# Patient Record
Sex: Female | Born: 1945 | Race: Black or African American | Hispanic: No | Marital: Single | Smoking: Never smoker
Health system: Southern US, Community
[De-identification: ages and names within clinical notes are randomized; demographics above are authoritative.]

## PROBLEM LIST (undated history)

## (undated) DIAGNOSIS — C649 Malignant neoplasm of unspecified kidney, except renal pelvis: Secondary | ICD-10-CM

## (undated) DIAGNOSIS — G20 Parkinson's disease: Secondary | ICD-10-CM

## (undated) DIAGNOSIS — K31A0 Gastric intestinal metaplasia, unspecified: Secondary | ICD-10-CM

## (undated) DIAGNOSIS — M1990 Unspecified osteoarthritis, unspecified site: Secondary | ICD-10-CM

## (undated) DIAGNOSIS — N289 Disorder of kidney and ureter, unspecified: Secondary | ICD-10-CM

## (undated) DIAGNOSIS — B019 Varicella without complication: Secondary | ICD-10-CM

## (undated) DIAGNOSIS — R011 Cardiac murmur, unspecified: Secondary | ICD-10-CM

## (undated) DIAGNOSIS — R079 Chest pain, unspecified: Secondary | ICD-10-CM

## (undated) DIAGNOSIS — K5790 Diverticulosis of intestine, part unspecified, without perforation or abscess without bleeding: Secondary | ICD-10-CM

## (undated) DIAGNOSIS — H269 Unspecified cataract: Secondary | ICD-10-CM

## (undated) DIAGNOSIS — I10 Essential (primary) hypertension: Secondary | ICD-10-CM

## (undated) DIAGNOSIS — T148XXA Other injury of unspecified body region, initial encounter: Secondary | ICD-10-CM

## (undated) DIAGNOSIS — E1129 Type 2 diabetes mellitus with other diabetic kidney complication: Secondary | ICD-10-CM

## (undated) DIAGNOSIS — D219 Benign neoplasm of connective and other soft tissue, unspecified: Secondary | ICD-10-CM

## (undated) DIAGNOSIS — E039 Hypothyroidism, unspecified: Secondary | ICD-10-CM

## (undated) DIAGNOSIS — E7800 Pure hypercholesterolemia, unspecified: Secondary | ICD-10-CM

## (undated) DIAGNOSIS — D649 Anemia, unspecified: Secondary | ICD-10-CM

## (undated) DIAGNOSIS — K3189 Other diseases of stomach and duodenum: Secondary | ICD-10-CM

## (undated) DIAGNOSIS — R634 Abnormal weight loss: Secondary | ICD-10-CM

## (undated) DIAGNOSIS — N390 Urinary tract infection, site not specified: Secondary | ICD-10-CM

## (undated) DIAGNOSIS — K219 Gastro-esophageal reflux disease without esophagitis: Secondary | ICD-10-CM

## (undated) HISTORY — DX: Disorder of kidney and ureter, unspecified: N28.9

## (undated) HISTORY — DX: Unspecified osteoarthritis, unspecified site: M19.90

## (undated) HISTORY — DX: Malignant neoplasm of unspecified kidney, except renal pelvis: C64.9

## (undated) HISTORY — PX: DG BARIUM SWALLOW (ARMC HX): HXRAD1448

## (undated) HISTORY — PX: CATARACT EXTRACTION, BILATERAL: SHX1313

## (undated) HISTORY — DX: Varicella without complication: B01.9

## (undated) HISTORY — DX: Other injury of unspecified body region, initial encounter: T14.8XXA

## (undated) HISTORY — DX: Type 2 diabetes mellitus with other diabetic kidney complication: E11.29

## (undated) HISTORY — DX: Gastro-esophageal reflux disease without esophagitis: K21.9

## (undated) HISTORY — DX: Other diseases of stomach and duodenum: K31.89

## (undated) HISTORY — DX: Unspecified cataract: H26.9

## (undated) HISTORY — DX: Hypothyroidism, unspecified: E03.9

## (undated) HISTORY — DX: Chest pain, unspecified: R07.9

## (undated) HISTORY — DX: Urinary tract infection, site not specified: N39.0

## (undated) HISTORY — DX: Gastric intestinal metaplasia, unspecified: K31.A0

## (undated) HISTORY — DX: Cardiac murmur, unspecified: R01.1

## (undated) HISTORY — DX: Anemia, unspecified: D64.9

## (undated) HISTORY — DX: Diverticulosis of intestine, part unspecified, without perforation or abscess without bleeding: K57.90

## (undated) HISTORY — DX: Abnormal weight loss: R63.4

## (undated) HISTORY — PX: COLONOSCOPY: SHX174

## (undated) HISTORY — DX: Benign neoplasm of connective and other soft tissue, unspecified: D21.9

## (undated) HISTORY — DX: Parkinson's disease: G20

---

## 1987-10-04 HISTORY — PX: ABDOMINAL HYSTERECTOMY: SHX81

## 1998-12-15 ENCOUNTER — Encounter: Payer: Self-pay | Admitting: Obstetrics and Gynecology

## 1998-12-15 ENCOUNTER — Ambulatory Visit (HOSPITAL_COMMUNITY): Admission: RE | Admit: 1998-12-15 | Discharge: 1998-12-15 | Payer: Self-pay | Admitting: Obstetrics and Gynecology

## 1999-07-23 ENCOUNTER — Other Ambulatory Visit: Admission: RE | Admit: 1999-07-23 | Discharge: 1999-07-23 | Payer: Self-pay | Admitting: *Deleted

## 1999-10-27 ENCOUNTER — Ambulatory Visit (HOSPITAL_COMMUNITY): Admission: RE | Admit: 1999-10-27 | Discharge: 1999-10-27 | Payer: Self-pay | Admitting: Endocrinology

## 1999-10-27 ENCOUNTER — Encounter: Payer: Self-pay | Admitting: Endocrinology

## 1999-11-05 ENCOUNTER — Ambulatory Visit (HOSPITAL_COMMUNITY): Admission: RE | Admit: 1999-11-05 | Discharge: 1999-11-05 | Payer: Self-pay | Admitting: Endocrinology

## 1999-11-05 ENCOUNTER — Encounter: Payer: Self-pay | Admitting: Endocrinology

## 2000-09-05 ENCOUNTER — Ambulatory Visit (HOSPITAL_COMMUNITY): Admission: RE | Admit: 2000-09-05 | Discharge: 2000-09-05 | Payer: Self-pay | Admitting: *Deleted

## 2000-09-05 ENCOUNTER — Encounter: Payer: Self-pay | Admitting: *Deleted

## 2000-09-14 ENCOUNTER — Encounter: Admission: RE | Admit: 2000-09-14 | Discharge: 2000-09-14 | Payer: Self-pay | Admitting: *Deleted

## 2000-09-14 ENCOUNTER — Encounter: Payer: Self-pay | Admitting: *Deleted

## 2000-10-12 ENCOUNTER — Other Ambulatory Visit: Admission: RE | Admit: 2000-10-12 | Discharge: 2000-10-12 | Payer: Self-pay | Admitting: Obstetrics and Gynecology

## 2004-07-29 ENCOUNTER — Ambulatory Visit (HOSPITAL_COMMUNITY): Admission: RE | Admit: 2004-07-29 | Discharge: 2004-07-29 | Payer: Self-pay | Admitting: Gastroenterology

## 2005-10-14 ENCOUNTER — Ambulatory Visit (HOSPITAL_COMMUNITY): Admission: RE | Admit: 2005-10-14 | Discharge: 2005-10-14 | Payer: Self-pay | Admitting: Obstetrics and Gynecology

## 2005-10-17 LAB — HM MAMMOGRAPHY

## 2005-10-31 ENCOUNTER — Ambulatory Visit (HOSPITAL_COMMUNITY): Admission: RE | Admit: 2005-10-31 | Discharge: 2005-10-31 | Payer: Self-pay | Admitting: Obstetrics and Gynecology

## 2005-10-31 LAB — HM DEXA SCAN

## 2005-11-03 ENCOUNTER — Encounter: Admission: RE | Admit: 2005-11-03 | Discharge: 2005-11-03 | Payer: Self-pay | Admitting: Obstetrics and Gynecology

## 2008-01-02 LAB — HM MAMMOGRAPHY

## 2010-04-17 ENCOUNTER — Observation Stay (HOSPITAL_COMMUNITY): Admission: EM | Admit: 2010-04-17 | Discharge: 2010-04-19 | Payer: Self-pay | Admitting: Emergency Medicine

## 2010-10-24 ENCOUNTER — Encounter: Payer: Self-pay | Admitting: Obstetrics and Gynecology

## 2010-12-18 LAB — CBC
MCH: 29.3 pg (ref 26.0–34.0)
MCHC: 33.3 g/dL (ref 30.0–36.0)
MCHC: 33.4 g/dL (ref 30.0–36.0)
MCV: 87.8 fL (ref 78.0–100.0)
Platelets: 203 10*3/uL (ref 150–400)
RBC: 3.99 MIL/uL (ref 3.87–5.11)
RDW: 15.1 % (ref 11.5–15.5)
RDW: 15.4 % (ref 11.5–15.5)
WBC: 6 10*3/uL (ref 4.0–10.5)

## 2010-12-18 LAB — COMPREHENSIVE METABOLIC PANEL
ALT: 16 U/L (ref 0–35)
AST: 16 U/L (ref 0–37)
Albumin: 3.7 g/dL (ref 3.5–5.2)
Alkaline Phosphatase: 49 U/L (ref 39–117)
Calcium: 9.3 mg/dL (ref 8.4–10.5)
Sodium: 140 mEq/L (ref 135–145)
Total Bilirubin: 1.1 mg/dL (ref 0.3–1.2)
Total Protein: 6.8 g/dL (ref 6.0–8.3)

## 2010-12-18 LAB — BASIC METABOLIC PANEL
CO2: 27 mEq/L (ref 19–32)
CO2: 27 mEq/L (ref 19–32)
Calcium: 9 mg/dL (ref 8.4–10.5)
Chloride: 107 mEq/L (ref 96–112)
Creatinine, Ser: 0.91 mg/dL (ref 0.4–1.2)
Creatinine, Ser: 1.16 mg/dL (ref 0.4–1.2)
GFR calc Af Amer: >60 mL/min (ref >60)
GFR calc non Af Amer: >60 mL/min (ref >60)
Glucose, Bld: 137 mg/dL — ABNORMAL HIGH (ref 70–99)
Glucose, Bld: 163 mg/dL — ABNORMAL HIGH (ref 70–99)
Sodium: 140 mEq/L (ref 135–145)

## 2010-12-18 LAB — DIFFERENTIAL
Basophils Absolute: 0 10*3/uL (ref 0.0–0.1)
Basophils Relative: 1 % (ref 0–1)
Lymphocytes Relative: 27 % (ref 12–46)
Monocytes Relative: 8 % (ref 3–12)
Neutro Abs: 3.8 10*3/uL (ref 1.7–7.7)
Neutrophils Relative %: 63 % (ref 43–77)

## 2010-12-18 LAB — CARDIAC PANEL(CRET KIN+CKTOT+MB+TROPI)
CK, MB: 1.5 ng/mL (ref 0.3–4.0)
CK, MB: 1.5 ng/mL (ref 0.3–4.0)
CK, MB: 1.8 ng/mL (ref 0.3–4.0)
CK, MB: 1.9 ng/mL (ref 0.3–4.0)
Relative Index: 1.1 (ref 0.0–2.5)
Relative Index: 1.4 (ref 0.0–2.5)
Total CK: 119 U/L (ref 7–177)
Total CK: 134 U/L (ref 7–177)
Troponin I: 0.01 ng/mL (ref 0.00–0.06)
Troponin I: 0.02 ng/mL (ref 0.00–0.06)

## 2010-12-18 LAB — TSH: TSH: 0.277 u[IU]/mL — ABNORMAL LOW (ref 0.350–4.500)

## 2010-12-18 LAB — LIPID PANEL
Triglycerides: 104 mg/dL (ref <150)
VLDL: 21 mg/dL (ref 0–40)

## 2010-12-18 LAB — POCT CARDIAC MARKERS
CKMB, poc: 1.2 ng/mL (ref 1.0–8.0)
Myoglobin, poc: 67.4 ng/mL (ref 12–200)

## 2010-12-18 LAB — T4, FREE: Free T4: 1.21 ng/dL (ref 0.80–1.80)

## 2010-12-18 LAB — GLUCOSE, CAPILLARY
Glucose-Capillary: 127 mg/dL — ABNORMAL HIGH (ref 70–99)
Glucose-Capillary: 136 mg/dL — ABNORMAL HIGH (ref 70–99)

## 2010-12-18 LAB — HEMOGLOBIN A1C: Mean Plasma Glucose: 151 mg/dL — ABNORMAL HIGH (ref <117)

## 2010-12-18 LAB — MAGNESIUM: Magnesium: 1.7 mg/dL (ref 1.5–2.5)

## 2011-02-18 NOTE — Op Note (Signed)
NAME:  Pamela Patton, Pamela Patton                 ACCOUNT NO.:  [id number]   MEDICAL RECORD NO.:  [id number]          PATIENT TYPE:  AMB   LOCATION:  ENDO                         FACILITY:  [facility]   PHYSICIAN:  Graylin Shiver, M.D.   DATE OF BIRTH:  [birth year]   DATE OF PROCEDURE:  07/29/2004  DATE OF DISCHARGE:                                 OPERATIVE REPORT   PROCEDURE:  Flexible sigmoidoscopy.   SURGEON:   INDICATIONS FOR PROCEDURE:  Screening.   Informed consent was obtained after explanation of the risks of bleeding,  infection and perforation.   PREMEDICATION:  Fentanyl 75 mcg IV and Versed 7.5 mg IV.   DESCRIPTION OF PROCEDURE:  With the patient in the left lateral decubitus  position, a rectal examination was performed.  No masses were felt.  The  Olympus flexible pediatric adjustable colonoscope was inserted into the  rectum and advanced around to the colon.  The sigmoid was tortuous and  fixed.  I could not advance the scope out of the sigmoid colon.  The patient  has had a prior hysterectomy and has a large lower midline abdominal scar.  I suspect that she has adhesions.  There were diverticula noted in the  sigmoid.  The rectum looked normal.  The patient tolerated the procedure  well without complications.   IMPRESSION:  Diverticulosis in the sigmoid.   PLAN:  Proceed with barium enema.       SFG/MEDQ  D:  07/29/2004  T:  07/29/2004  Job:  [id number]   cc:   Veverly Fells. Altheimer, M.D.  [address]  [place]  [place] [address]  Fax: [phone]

## 2012-08-06 ENCOUNTER — Encounter (HOSPITAL_COMMUNITY): Payer: Self-pay | Admitting: Emergency Medicine

## 2012-08-06 ENCOUNTER — Emergency Department (INDEPENDENT_AMBULATORY_CARE_PROVIDER_SITE_OTHER)
Admission: EM | Admit: 2012-08-06 | Discharge: 2012-08-06 | Disposition: A | Discharge status: Home / Self Care | Payer: Medicare Other | Source: Home / Self Care | Attending: Family Medicine | Admitting: Family Medicine

## 2012-08-06 DIAGNOSIS — S239XXA Sprain of unspecified parts of thorax, initial encounter: Secondary | ICD-10-CM

## 2012-08-06 DIAGNOSIS — S29012A Strain of muscle and tendon of back wall of thorax, initial encounter: Secondary | ICD-10-CM

## 2012-08-06 HISTORY — DX: Essential (primary) hypertension: I10

## 2012-08-06 HISTORY — DX: Pure hypercholesterolemia, unspecified: E78.00

## 2012-08-06 MED ORDER — METAXALONE 800 MG PO TABS: 800.0000 mg | ORAL_TABLET | Freq: Three times a day (TID) | ORAL | Status: DC

## 2012-08-06 NOTE — ED Notes (Signed)
Placed in gown for evaluation

## 2012-08-06 NOTE — ED Notes (Signed)
Patient did not have doses of medicines.  At end of nurse assessment, patient found paper with doses.

## 2012-08-06 NOTE — ED Notes (Signed)
Has an appt with [facility] to establish pcp.  The endocrinologist was prescribing medicines.

## 2012-08-06 NOTE — ED Notes (Addendum)
Reports neck and back pain onset 3 days ago.  Reports pain present when she woke that am.  Reports similar episode approx 3 weeks ago.  This pain went away without intervention.  Patient currently involved in exercise class that she has attended since August 2013.  Denies feeling discomfort during class.  Reports base of neck spasm and spreading down back to mid back, spreading to either side of back.  Patient has been seen by cardiologist 2 weeks ago and did mention back pain then.  Physician did not offer any explanation for pain.  Denies chest pain, denies difficulty breathing.

## 2012-08-06 NOTE — ED Provider Notes (Signed)
History     CSN: [id number]  Arrival date & time 08/06/12  1230   First MD Initiated Contact with Patient 08/06/12 1300      Chief Complaint  Patient presents with  . Neck Pain    (Consider location/radiation/quality/duration/timing/severity/associated sxs/prior treatment) Patient is a 66 y.o. female presenting with neck pain. The history is provided by the patient.  Neck Pain  This is a new problem. The current episode started more than 2 days ago. The problem has not changed since onset.The pain is associated with nothing. The quality of the pain is described as shooting. The pain radiates to the left scapula and right scapula. Pertinent negatives include no chest pain, no numbness, no bowel incontinence and no paresis.    Past Medical History  Diagnosis Date  . Thyroid disease   . Diabetes mellitus without complication   . Hypertension   . High cholesterol     Past Surgical History  Procedure Date  . Abdominal hysterectomy     No family history on file.  History  Substance Use Topics  . Smoking status: Never Smoker   . Smokeless tobacco: Not on file  . Alcohol Use: No    OB History    Grav Para Term Preterm Abortions TAB SAB Ect Mult Living                  Review of Systems  Constitutional: Negative.   HENT: Positive for neck pain.   Respiratory: Negative for chest tightness.   Cardiovascular: Negative for chest pain.  Gastrointestinal: Negative for bowel incontinence.  Musculoskeletal: Positive for back pain. Negative for joint swelling and gait problem.  Neurological: Negative for numbness.    Allergies  Review of patient's allergies indicates no known allergies.  Home Medications   Current Outpatient Rx  Name  Route  Sig  Dispense  Refill  . AMLODIPINE BESYLATE PO   Oral   Take 10 mg by mouth.          . ASPIRIN 325 MG PO TBEC   Oral   Take 325 mg by mouth daily.         . ATENOLOL PO   Oral   Take 50 mg by mouth.          .  ATORVASTATIN CALCIUM PO   Oral   Take 40 mg by mouth.          . IBUPROFEN 200 MG PO TABS   Oral   Take 200 mg by mouth every 6 (six) hours as needed.         Marland Kitchen NOVOLOG [place]   Subcutaneous   Inject into the skin.         . INSULIN GLARGINE 100 UNIT/ML [place] SOLN   Subcutaneous   Inject 30 Units into the skin at bedtime.         Marland Kitchen LEVOTHYROXINE SODIUM 75 MCG PO TABS   Oral   Take 75 mcg by mouth daily.         Marland Kitchen METFORMIN HCL PO   Oral   Take 500 mg by mouth.          . ACTOS PO   Oral   Take by mouth.         Marland Kitchen RAMIPRIL PO   Oral   Take by mouth.         . DIOVAN PO   Oral   Take 320 mg by mouth.          Marland Kitchen  METAXALONE 800 MG PO TABS   Oral   Take 1 tablet (800 mg total) by mouth 3 (three) times daily. For muscle spasms.   21 tablet   0     BP 135/52  Pulse 64  Temp 97.9 F (36.6 C) (Oral)  Resp 20  SpO2 100%  Physical Exam  Nursing note and vitals reviewed. Constitutional: She is oriented to person, place, and time. She appears well-developed and well-nourished.  HENT:  Head: Normocephalic.  Neck: Normal range of motion. Neck supple.  Cardiovascular: Normal rate.   Pulmonary/Chest: Effort normal and breath sounds normal. She exhibits no tenderness.  Musculoskeletal: She exhibits tenderness.       Back:  Neurological: She is alert and oriented to person, place, and time.    ED Course  Procedures (including critical care time)  Labs Reviewed - No data to display No results found.   1. Upper back strain       MDM          Linna Hoff, MD 08/07/12 1436

## 2012-08-06 NOTE — ED Notes (Signed)
Department acuity delay

## 2012-08-24 ENCOUNTER — Encounter: Payer: Self-pay | Admitting: Family Medicine

## 2012-08-24 ENCOUNTER — Ambulatory Visit (INDEPENDENT_AMBULATORY_CARE_PROVIDER_SITE_OTHER): Payer: Medicare Other | Admitting: Family Medicine

## 2012-08-24 VITALS — BP 110/64 | HR 78 | Temp 98.8°F | Ht 65.75 in | Wt 190.0 lb

## 2012-08-24 DIAGNOSIS — E039 Hypothyroidism, unspecified: Secondary | ICD-10-CM | POA: Insufficient documentation

## 2012-08-24 DIAGNOSIS — E1129 Type 2 diabetes mellitus with other diabetic kidney complication: Secondary | ICD-10-CM

## 2012-08-24 DIAGNOSIS — I10 Essential (primary) hypertension: Secondary | ICD-10-CM

## 2012-08-24 DIAGNOSIS — N058 Unspecified nephritic syndrome with other morphologic changes: Secondary | ICD-10-CM

## 2012-08-24 DIAGNOSIS — E559 Vitamin D deficiency, unspecified: Secondary | ICD-10-CM

## 2012-08-24 DIAGNOSIS — E785 Hyperlipidemia, unspecified: Secondary | ICD-10-CM

## 2012-08-24 HISTORY — DX: Hypothyroidism, unspecified: E03.9

## 2012-08-24 HISTORY — DX: Vitamin D deficiency, unspecified: E55.9

## 2012-08-24 NOTE — Progress Notes (Signed)
Chief Complaint  Patient presents with  . Establish Care    HPI: Pamela Patton is here to establish care.  Last PCP and physical: Dr. Janine Limbo was her PCP - he passed away, then saw Dr. Leslie Dales. Last physical was in 2007 with her gyn doctor. Had total hysterectomy with unilateral oophorectomy for fibroids.  Work: recently retired Tree surgeon, Location manager - trained as vetrinarian Home Situation:  Lives alone, no family around, good friends Spiritual Beliefs: christian, very involved with her church - [employer]  Has the following chronic problems and concerns today:  DM: -Taking: lantus 35units daily, novolog 8 units with meal sliding scale, metformin, actos, acei, ASA -Last hgbA1c: none in our chart, last HgbA1c 6.9 recently per patient -Home BS: fasting 120 -last eye exam, foot exam: UTD -does have stage 3 kidney disease  HTN: -taking norvasc, atenolol, ramipril, valsartan - has been on acei and arb for a long time -denies:DP, SOB, swelling, palpitaiton  HLD: -atorvastatin  Hypothyroid: -on synthroid -stable, Dr. Leslie Dales  Back Strain: -dx in ED recently -resolved  Vitamin D deficiency: -in the past -takes Vit D intermitently  Other Providers: -Dr. Leslie Dales, endocrinology at [facility], followed for her diabetes and hypothyroidism - sees him on a regular basis, next visit December -Optho, Dr. Rosary Lively, UTD -Cards: Dr. Allyson Sabal - for her HTN, HLD   ROS: See pertinent positives and negatives per HPI.  Past Medical History  Diagnosis Date  . Thyroid disease   . Diabetes mellitus without complication   . Hypertension   . High cholesterol   . Arthritis   . Cancer   . Chicken pox   . GERD (gastroesophageal reflux disease)   . Hypertension   . High cholesterol   . Kidney disease   . Thyroid disease   . UTI (urinary tract infection)   . Chest pain summer 2012    Family History  Problem Relation Age of Onset  . Colon cancer Other   . Lung  cancer      grandparent  . Stroke      History   Social History  . Marital Status: Married    Spouse Name: N/A    Number of Children: N/A  . Years of Education: N/A   Social History Main Topics  . Smoking status: Never Smoker   . Smokeless tobacco: None  . Alcohol Use: No  . Drug Use: No  . Sexually Active:    Other Topics Concern  . None   Social History Narrative  . None    Current outpatient prescriptions:AMLODIPINE BESYLATE PO, Take 10 mg by mouth daily. , Disp: , Rfl: ;  aspirin 81 MG tablet, Take 81 mg by mouth daily., Disp: , Rfl: ;  ATENOLOL PO, Take 50 mg by mouth 2 (two) times daily. , Disp: , Rfl: ;  ATORVASTATIN CALCIUM PO, Take 40 mg by mouth at bedtime and may repeat dose one time if needed. , Disp: , Rfl:  Calcium Carbonate-Vitamin D (CALTRATE 600+D) 600-400 MG-UNIT per tablet, Take 1 tablet by mouth daily., Disp: , Rfl: ;  Cholecalciferol (VITAMIN D3) 2000 UNITS TABS, Take by mouth., Disp: , Rfl: ;  hydrochlorothiazide (HYDRODIURIL) 25 MG tablet, Take 25 mg by mouth daily. , Disp: , Rfl: ;  Insulin Aspart (NOVOLOG [place]), Inject into the skin as directed. , Disp: , Rfl:  insulin glargine (LANTUS) 100 UNIT/ML injection, Inject 35 Units into the skin at bedtime. , Disp: , Rfl: ;  levothyroxine (SYNTHROID, LEVOTHROID)  75 MCG tablet, Take 75 mcg by mouth daily., Disp: , Rfl: ;  METFORMIN HCL PO, Take 500 mg by mouth 2 (two) times daily. , Disp: , Rfl: ;  Multiple Vitamins-Minerals (CENTRUM SILVER PO), Take by mouth daily., Disp: , Rfl: ;  ONETOUCH DELICA LANCETS MISC, , Disp: , Rfl:  ONETOUCH VERIO IQ test strip, , Disp: , Rfl: ;  Pioglitazone HCl (ACTOS PO), Take 45 mg by mouth daily. , Disp: , Rfl: ;  RAMIPRIL PO, Take 10 mg by mouth daily. , Disp: , Rfl: ;  Valsartan (DIOVAN PO), Take 320 mg by mouth daily. , Disp: , Rfl: ;  Vitamin D, Ergocalciferol, (DRISDOL) 50000 UNITS CAPS, Take 50,000 Units by mouth., Disp: , Rfl:   EXAM:  Filed Vitals:   08/24/12 1249  BP:  110/64  Pulse: 78  Temp: 98.8 F (37.1 C)    Body mass index is 30.90 kg/(m^2).  GENERAL: vitals reviewed and listed above, alert, oriented, appears well hydrated and in no acute distress  HEENT: atraumatic, conjunttiva clear, no obvious abnormalities on inspection of external nose and ears  NECK: no obvious masses on inspection  LUNGS: clear to auscultation bilaterally, no wheezes, rales or rhonchi, good air movement  CV: HRRR, SEM, no peripheral edema  MS: moves all extremities without noticeable abnormality  PSYCH: pleasant and cooperative, no obvious depression or anxiety  ASSESSMENT AND PLAN:  Discussed the following assessment and plan:  1. Hypertension   2. Hyperlipemia   3. Hypothyroid   4. Vitamin D deficiency   5. Diabetes mellitus with renal complications    -Had labs recently - will try to get results, gets labs with endocrinology - asked pt to have him send [place] office visit notes and labs -will have her folllow up in 1 month for physical and health maintenance - hopefully will have records by then  -We reviewed the PMH, PSH, FH, SH, Meds and Allergies. -We have asked for records for pertinent exams, studies, vaccines and notes from previous providers. -We have advised patient to follow up per instructions below - needs CPE to address health maintenance   -Patient advised to return or notify a doctor immediately if symptoms worsen or persist or new concerns arise.  Patient Instructions  -We have ordered labs or studies at this visit. It can take up to 1-2 weeks for results and processing. We will contact you with instructions IF your results are abnormal. Normal results will be released to your [facility]. If you have not heard from [place] or can not find your results in [facility] in 2 weeks please contact our office.  -PLEASE SIGN UP FOR MYCHART TODAY   We recommend the following healthy lifestyle measures: - eat a healthy diet consisting of lots of vegetables,  fruits, beans, nuts, seeds, healthy meats such as white chicken and fish and whole grains.  - avoid fried foods, fast food, processed foods, sodas, red meet and other fattening foods.  - get a least 150 minutes of aerobic exercise per week.   Please have your other doctors send their reports and any labs or studies.  Follow up in: 1 month for CPE      Demeka Sutter R.

## 2012-08-24 NOTE — Patient Instructions (Addendum)
-  We have ordered labs or studies at this visit. It can take up to 1-2 weeks for results and processing. We will contact you with instructions IF your results are abnormal. Normal results will be released to your [facility]. If you have not heard from [place] or can not find your results in [facility] in 2 weeks please contact our office.  -PLEASE SIGN UP FOR MYCHART TODAY   We recommend the following healthy lifestyle measures: - eat a healthy diet consisting of lots of vegetables, fruits, beans, nuts, seeds, healthy meats such as white chicken and fish and whole grains.  - avoid fried foods, fast food, processed foods, sodas, red meet and other fattening foods.  - get a least 150 minutes of aerobic exercise per week.   Please have your other doctors send their reports and any labs or studies.  Follow up in: 1 month for CPE

## 2012-10-04 ENCOUNTER — Encounter: Payer: Self-pay | Admitting: Family Medicine

## 2012-10-04 DIAGNOSIS — E1129 Type 2 diabetes mellitus with other diabetic kidney complication: Secondary | ICD-10-CM

## 2012-10-04 DIAGNOSIS — E1165 Type 2 diabetes mellitus with hyperglycemia: Secondary | ICD-10-CM

## 2012-10-04 DIAGNOSIS — N189 Chronic kidney disease, unspecified: Secondary | ICD-10-CM

## 2012-10-08 ENCOUNTER — Encounter: Payer: Self-pay | Admitting: Family Medicine

## 2012-10-08 ENCOUNTER — Ambulatory Visit (INDEPENDENT_AMBULATORY_CARE_PROVIDER_SITE_OTHER): Payer: Medicare Other | Admitting: Family Medicine

## 2012-10-08 VITALS — BP 110/68 | HR 70 | Temp 98.4°F | Ht 65.75 in | Wt 192.0 lb

## 2012-10-08 DIAGNOSIS — Z0000 Encounter for general adult medical examination without abnormal findings: Secondary | ICD-10-CM

## 2012-10-08 DIAGNOSIS — N183 Chronic kidney disease, stage 3 unspecified: Secondary | ICD-10-CM

## 2012-10-08 DIAGNOSIS — E1129 Type 2 diabetes mellitus with other diabetic kidney complication: Secondary | ICD-10-CM

## 2012-10-08 DIAGNOSIS — E039 Hypothyroidism, unspecified: Secondary | ICD-10-CM

## 2012-10-08 DIAGNOSIS — N058 Unspecified nephritic syndrome with other morphologic changes: Secondary | ICD-10-CM

## 2012-10-08 DIAGNOSIS — I10 Essential (primary) hypertension: Secondary | ICD-10-CM

## 2012-10-08 HISTORY — DX: Chronic kidney disease, stage 3 unspecified: N18.30

## 2012-10-08 HISTORY — DX: Type 2 diabetes mellitus with other diabetic kidney complication: E11.29

## 2012-10-08 NOTE — Progress Notes (Signed)
Received lab report from Dr. Altheimer's office. Labs 09/28/12: HgbA1c 6.3 Lipids at goal Cr 1.26 Vit D 18.4 CBC with mild normocytic anemia - Hgb 10.8 Microalb/cr urine 8.8

## 2012-10-08 NOTE — Progress Notes (Signed)
Chief Complaint  Patient presents with  . Medicare Wellness    HPI:  Here for CPE:  -Concerns today:  1) ? Cyst on back -hx of these requiring removal, no pain  2) HTN: -discussed on acei and arb last appt and she discussed with dr. altheimer and  DCd lisinopril -BP good today  -Diet: variety of foods, balance and well rounded, watching portion sizes - working with nutritionist  -Exercise: not exercising  -Calcium and Vitamin D: taking these - 2000 IU vit D daily and calcium  -takes asa daily  -Diabetes and Dyslipidemia Screening: followed by Dr. Leslie Dales (endo) and Dr. Allyson Sabal (cards) - Recently saw Dr. Leslie Dales for her DM and hypothyroidism and ramipril stopped as she was on and acei and an arb. Labs done at that appt (HgbA1c, BMP, lipids)  -Hx of HTN: treated  -Vaccines: had shingles in 2011  -pap history: s/p complete hysterectomy - does not need pap smear  -sexual activity: yes, female - one partner  -wants STI testing: not worried about this and does not want testing  -FH breast, colon or ovarian ca: see FH -last mammo: hasn't had mammo in a few years -colon cancer screening: in 2009 - had barium due to diverticulum - told this was normal, told next screening in 10 years  -Alcohol, Tobacco, drug use: see social history  Review of Systems - negative for CP, SOB, bowel changes, unintentional weight loss, fevers, bleeding, worrisome skin lesions  Past Medical History  Diagnosis Date  . Thyroid disease   . Diabetes mellitus without complication   . Hypertension   . High cholesterol   . Arthritis   . Cancer   . Chicken pox   . GERD (gastroesophageal reflux disease)   . Hypertension   . High cholesterol   . Kidney disease   . Thyroid disease   . UTI (urinary tract infection)   . Chest pain summer 2012    Family History  Problem Relation Age of Onset  . Colon cancer Other   . Lung cancer      grandparent  . Stroke    . Cancer Mother     liver  .  Stroke Father 61  . Diabetes Father   . Hypertension Father     History   Social History  . Marital Status: Married    Spouse Name: N/A    Number of Children: N/A  . Years of Education: N/A   Social History Main Topics  . Smoking status: Never Smoker   . Smokeless tobacco: None  . Alcohol Use: No  . Drug Use: No  . Sexually Active: Yes   Other Topics Concern  . None   Social History Narrative  . None    Current outpatient prescriptions:AMLODIPINE BESYLATE PO, Take 10 mg by mouth daily. , Disp: , Rfl: ;  aspirin 81 MG tablet, Take 81 mg by mouth daily., Disp: , Rfl: ;  ATENOLOL PO, Take 50 mg by mouth 2 (two) times daily. , Disp: , Rfl: ;  ATORVASTATIN CALCIUM PO, Take 40 mg by mouth at bedtime and may repeat dose one time if needed. , Disp: , Rfl:  Calcium Carbonate-Vitamin D (CALTRATE 600+D) 600-400 MG-UNIT per tablet, Take 1 tablet by mouth daily., Disp: , Rfl: ;  Cholecalciferol (VITAMIN D3) 2000 UNITS TABS, Take by mouth., Disp: , Rfl: ;  hydrochlorothiazide (HYDRODIURIL) 25 MG tablet, Take 25 mg by mouth daily. , Disp: , Rfl: ;  Insulin Aspart (NOVOLOG [place]), Inject  into the skin as directed. , Disp: , Rfl:  insulin glargine (LANTUS) 100 UNIT/ML injection, Inject 35 Units into the skin at bedtime. , Disp: , Rfl: ;  levothyroxine (SYNTHROID, LEVOTHROID) 75 MCG tablet, Take 75 mcg by mouth daily., Disp: , Rfl: ;  METFORMIN HCL PO, Take 500 mg by mouth 2 (two) times daily. , Disp: , Rfl: ;  Multiple Vitamins-Minerals (CENTRUM SILVER PO), Take by mouth daily., Disp: , Rfl: ;  ONETOUCH DELICA LANCETS MISC, , Disp: , Rfl:  ONETOUCH VERIO IQ test strip, , Disp: , Rfl: ;  Pioglitazone HCl (ACTOS PO), Take 45 mg by mouth daily. , Disp: , Rfl: ;  Valsartan (DIOVAN PO), Take 320 mg by mouth daily. , Disp: , Rfl: ;  Vitamin D, Ergocalciferol, (DRISDOL) 50000 UNITS CAPS, Take 50,000 Units by mouth., Disp: , Rfl:   EXAM:  Filed Vitals:   10/08/12 0906  BP: 110/68  Pulse: 70  Temp: 98.4 F  (36.9 C)    GENERAL: vitals reviewed and listed below, alert, oriented, appears well hydrated and in no acute distress  HEENT: head atraumatic, PERRLA, normal appearance of eyes, ears, nose and mouth. moist mucus membranes.  NECK: supple, no masses or lymphadenopathy  LUNGS: clear to auscultation bilaterally, no rales, rhonchi or wheeze  CV: HRRR, no peripheral edema or cyanosis, normal pedal pulses  BREAST: normal appearance - no lesions or discharge, on palpation normal breast tissue without any suspicious masses  ABDOMEN: bowel sounds normal, soft, non tender to palpation, no masses, no rebound or guarding  GU: deferred  RECTAL: deferred  SKIN: no rash or abnormal lesions, keratin clogged pore on L mid back  MS: normal gait, moves all extremities normally  NEURO: CN II-XII grossly intact, normal muscle strength and sensation to light touch on extremities  PSYCH: normal affect, pleasant and cooperative  ASSESSMENT AND PLAN:  Discussed the following assessment and plan:  1. Encounter for preventive health examination   2. DM (diabetes mellitus), type 2 with renal complications   3. CKD (chronic kidney disease) stage 3, GFR 30-59 ml/min   4. Hypertension   5. Hypothyroid     - 67 yo Female with MMP followed by endocrinology and cardiology here for health maintenance exam.  -Discussed and advised all [place] preventive services health task force level A and B recommendations for age, sex and risks.  -Advised at least 150 minutes of exercise per week and a healthy diet low in saturated fats and sweets and consisting of fresh fruits and vegetables, lean meats such as fish and white chicken and whole grains.  -labs, studies and vaccines per orders this encounter  -UTD on vaccines and colon cancer screening  -staff to call Dr. Altheimer's office to have labs faxed to [place]  -patient to schedule mammogram  -BP good, advised to cont to hold acei, on arb  -clogged pore on  back - removed keratin plug   No orders of the defined types were placed in this encounter.    Patient Instructions  We recommend the following healthy lifestyle measures: - eat a healthy diet consisting of lots of vegetables, fruits, beans, nuts, seeds, healthy meats such as white chicken and fish and whole grains.  - avoid fried foods, fast food, processed foods, sodas, red meet and other fattening foods.  - get a least 150 minutes of aerobic exercise per week.   Schedule your mammogram    Patient advised to return to clinic immediately if symptoms worsen or persist  or new concerns.  @LIFEPLAN @  No Follow-up on file.  Kriste Basque R.

## 2012-10-08 NOTE — Patient Instructions (Addendum)
We recommend the following healthy lifestyle measures: - eat a healthy diet consisting of lots of vegetables, fruits, beans, nuts, seeds, healthy meats such as white chicken and fish and whole grains.  - avoid fried foods, fast food, processed foods, sodas, red meet and other fattening foods.  - get a least 150 minutes of aerobic exercise per week.   Schedule your mammogram  Follow up in 3 months

## 2012-10-11 ENCOUNTER — Encounter: Payer: Self-pay | Admitting: Family Medicine

## 2012-10-11 NOTE — Progress Notes (Signed)
Last few OV notes from Dr. Leslie Dales received.  Labs 06/26/12: LDL78, HDL 63, TSH 0.74, Vit D 17.5, HgbA1c 6.9, eGR 60, Cr 1.10

## 2012-10-17 ENCOUNTER — Other Ambulatory Visit: Payer: Self-pay | Admitting: Family Medicine

## 2012-10-17 DIAGNOSIS — Z1231 Encounter for screening mammogram for malignant neoplasm of breast: Secondary | ICD-10-CM

## 2012-11-13 ENCOUNTER — Ambulatory Visit
Admission: RE | Admit: 2012-11-13 | Discharge: 2012-11-13 | Disposition: A | Discharge status: Home / Self Care | Payer: Medicare Other | Source: Ambulatory Visit | Attending: Family Medicine | Admitting: Family Medicine

## 2012-11-13 DIAGNOSIS — Z1231 Encounter for screening mammogram for malignant neoplasm of breast: Secondary | ICD-10-CM

## 2012-11-26 ENCOUNTER — Other Ambulatory Visit: Payer: Self-pay | Admitting: Family Medicine

## 2012-11-26 DIAGNOSIS — R928 Other abnormal and inconclusive findings on diagnostic imaging of breast: Secondary | ICD-10-CM

## 2012-12-04 ENCOUNTER — Ambulatory Visit
Admission: RE | Admit: 2012-12-04 | Discharge: 2012-12-04 | Disposition: A | Discharge status: Home / Self Care | Payer: Medicare Other | Source: Ambulatory Visit | Attending: Family Medicine | Admitting: Family Medicine

## 2012-12-04 DIAGNOSIS — R928 Other abnormal and inconclusive findings on diagnostic imaging of breast: Secondary | ICD-10-CM

## 2013-01-07 ENCOUNTER — Ambulatory Visit: Payer: Medicare Other | Admitting: Family Medicine

## 2013-01-08 ENCOUNTER — Ambulatory Visit (INDEPENDENT_AMBULATORY_CARE_PROVIDER_SITE_OTHER): Payer: Medicare Other | Admitting: Family Medicine

## 2013-01-08 ENCOUNTER — Encounter: Payer: Self-pay | Admitting: Family Medicine

## 2013-01-08 VITALS — BP 108/70 | HR 71 | Temp 98.3°F | Wt 191.0 lb

## 2013-01-08 DIAGNOSIS — E785 Hyperlipidemia, unspecified: Secondary | ICD-10-CM

## 2013-01-08 DIAGNOSIS — N183 Chronic kidney disease, stage 3 unspecified: Secondary | ICD-10-CM

## 2013-01-08 DIAGNOSIS — N058 Unspecified nephritic syndrome with other morphologic changes: Secondary | ICD-10-CM

## 2013-01-08 DIAGNOSIS — E039 Hypothyroidism, unspecified: Secondary | ICD-10-CM

## 2013-01-08 DIAGNOSIS — I129 Hypertensive chronic kidney disease with stage 1 through stage 4 chronic kidney disease, or unspecified chronic kidney disease: Secondary | ICD-10-CM

## 2013-01-08 DIAGNOSIS — I10 Essential (primary) hypertension: Secondary | ICD-10-CM

## 2013-01-08 DIAGNOSIS — E1129 Type 2 diabetes mellitus with other diabetic kidney complication: Secondary | ICD-10-CM

## 2013-01-08 NOTE — Progress Notes (Signed)
Chief Complaint  Patient presents with  . 3 month follow up    HPI:  Follow up:  DM:  -managed by endo -Taking: lantus 35units daily, novolog 8 units with meal sliding scale, metformin, actos, acei, ASA  -Last hgbA1c: 6.3 09/2013 with Dr. Leslie Dales - Saw him again yesterday and had labs -Home BS: fasting  80s-130 -last eye exam (july 2013), foot exam (yestreday with Dr. Leslie Dales): UTD  -does have stage 3 kidney disease  -denies foot lesions, polyuria, polydipsia -has been going to the gym 5 days per week recently, also has been working on improving diet  HTN:  -taking norvasc, atenolol,valsartan  -denies:DP, SOB, swelling, palpitaiton   HLD:  -atorvastatin  -at goal 09/2013  Hypothyroid:  -on synthroid  -stable, Dr. Leslie Dales    ROS: See pertinent positives and negatives per HPI.  Past Medical History  Diagnosis Date  . Thyroid disease   . Diabetes mellitus without complication   . Hypertension   . High cholesterol   . Arthritis   . Cancer   . Chicken pox   . GERD (gastroesophageal reflux disease)   . Hypertension   . High cholesterol   . Kidney disease   . Thyroid disease   . UTI (urinary tract infection)   . Chest pain summer 2012  . Diverticulosis     difficulty with colonoscopy 2005    Family History  Problem Relation Age of Onset  . Colon cancer Other   . Lung cancer      grandparent  . Stroke    . Cancer Mother     liver  . Stroke Father 83  . Diabetes Father   . Hypertension Father     History   Social History  . Marital Status: Married    Spouse Name: N/A    Number of Children: N/A  . Years of Education: N/A   Social History Main Topics  . Smoking status: Never Smoker   . Smokeless tobacco: None  . Alcohol Use: No  . Drug Use: No  . Sexually Active: Yes   Other Topics Concern  . None   Social History Narrative   Work: recently retired Tree surgeon, Location manager - trained as vetrinarian      Home Situation:   Lives alone, no family around, good friends      Spiritual Beliefs: christian, very involved with her church - [employer]      Lifestyle: exercising 5 days per week, trying to eat healthy             Current outpatient prescriptions:AMLODIPINE BESYLATE PO, Take 10 mg by mouth daily. , Disp: , Rfl: ;  aspirin 81 MG tablet, Take 81 mg by mouth daily., Disp: , Rfl: ;  ATENOLOL PO, Take 50 mg by mouth 2 (two) times daily. , Disp: , Rfl: ;  ATORVASTATIN CALCIUM PO, Take 40 mg by mouth at bedtime and may repeat dose one time if needed. , Disp: , Rfl:  Calcium Carbonate-Vitamin D (CALTRATE 600+D) 600-400 MG-UNIT per tablet, Take 1 tablet by mouth daily., Disp: , Rfl: ;  Cholecalciferol (VITAMIN D3) 2000 UNITS TABS, Take by mouth., Disp: , Rfl: ;  hydrochlorothiazide (HYDRODIURIL) 25 MG tablet, Take 25 mg by mouth daily. , Disp: , Rfl: ;  Insulin Aspart (NOVOLOG ), Inject into the skin as directed. , Disp: , Rfl:  insulin glargine (LANTUS) 100 UNIT/ML injection, Inject 35 Units into the skin at bedtime. , Disp: , Rfl: ;  levothyroxine (SYNTHROID, LEVOTHROID) 75 MCG tablet, Take 75 mcg by mouth daily., Disp: , Rfl: ;  METFORMIN HCL PO, Take 500 mg by mouth 2 (two) times daily. , Disp: , Rfl: ;  Multiple Vitamins-Minerals (CENTRUM SILVER PO), Take by mouth daily., Disp: , Rfl: ;  ONETOUCH DELICA LANCETS MISC, , Disp: , Rfl:  ONETOUCH VERIO IQ test strip, , Disp: , Rfl: ;  Pioglitazone HCl (ACTOS PO), Take 45 mg by mouth daily. , Disp: , Rfl: ;  Valsartan (DIOVAN PO), Take 320 mg by mouth daily. , Disp: , Rfl: ;  Vitamin D, Ergocalciferol, (DRISDOL) 50000 UNITS CAPS, Take 50,000 Units by mouth., Disp: , Rfl:   EXAM:  Filed Vitals:   01/08/13 0834  BP: 108/70  Pulse: 71  Temp: 98.3 F (36.8 C)    Body mass index is 31.06 kg/(m^2).  GENERAL: vitals reviewed and listed above, alert, oriented, appears well hydrated and in no acute distress  HEENT: atraumatic, conjunttiva clear, no obvious  abnormalities on inspection of external nose and ears  NECK: no obvious masses on inspection  LUNGS: clear to auscultation bilaterally, no wheezes, rales or rhonchi, good air movement  CV: HRRR, SEM, tr peripheral edema  MS: moves all extremities without noticeable abnormality  PSYCH: pleasant and cooperative, no obvious depression or anxiety  ASSESSMENT AND PLAN:  Discussed the following assessment and plan:  Hypertension  Hypothyroid, s/p radioactive iodine tx for graves 2001  Hyperlipemia  DM (diabetes mellitus), type 2 with renal complications  CKD (chronic kidney disease) stage 3, GFR 30-59 ml/min  -doing well -congratulated on improved lifestyle and discussed this at length -she will follow up with me in 6 months -Patient advised to return or notify a doctor immediately if symptoms worsen or persist or new concerns arise.  There are no Patient Instructions on file for this visit.   Kriste Basque R.

## 2013-06-20 ENCOUNTER — Encounter: Payer: Self-pay | Admitting: Family Medicine

## 2013-06-20 NOTE — Progress Notes (Signed)
Endocrine OV note from Jun 18 2013 placed in scan box.

## 2013-07-03 ENCOUNTER — Encounter: Payer: Self-pay | Admitting: Family Medicine

## 2013-07-03 ENCOUNTER — Ambulatory Visit (INDEPENDENT_AMBULATORY_CARE_PROVIDER_SITE_OTHER): Payer: Medicare Other | Admitting: Family Medicine

## 2013-07-03 VITALS — BP 110/70 | Temp 98.6°F | Wt 179.0 lb

## 2013-07-03 DIAGNOSIS — N058 Unspecified nephritic syndrome with other morphologic changes: Secondary | ICD-10-CM

## 2013-07-03 DIAGNOSIS — I10 Essential (primary) hypertension: Secondary | ICD-10-CM

## 2013-07-03 DIAGNOSIS — E039 Hypothyroidism, unspecified: Secondary | ICD-10-CM

## 2013-07-03 DIAGNOSIS — N183 Chronic kidney disease, stage 3 unspecified: Secondary | ICD-10-CM

## 2013-07-03 DIAGNOSIS — E1129 Type 2 diabetes mellitus with other diabetic kidney complication: Secondary | ICD-10-CM

## 2013-07-03 DIAGNOSIS — E785 Hyperlipidemia, unspecified: Secondary | ICD-10-CM

## 2013-07-03 DIAGNOSIS — I129 Hypertensive chronic kidney disease with stage 1 through stage 4 chronic kidney disease, or unspecified chronic kidney disease: Secondary | ICD-10-CM

## 2013-07-03 NOTE — Progress Notes (Signed)
Chief Complaint  Patient presents with  . Follow-up    HPI:  Follow up:  DM, stage 3 kidney disease and hpothyroid: -followed by Dr. Leslie Dales in [place] [facility] - saw him recently and had labs and diabetes is well controlled, thyroid is stable and renal function is good -sees Dr. Rosary Lively in optho -onasa, lantus 35 u, novolog, metformin, valsartan, actos and synthroid, Vit D -stable -she is doing regular exercise and eating healthy - she has made great progress with her weight  HTN/HLd: -followed by Dr. Allyson Sabal -on asa, norvasc, atenolol, atorvastatin, hctz, valsartan -stable  ROS: See pertinent positives and negatives per HPI.  Past Medical History  Diagnosis Date  . Diabetes mellitus without complication   . Arthritis   . Cancer   . Chicken pox   . GERD (gastroesophageal reflux disease)   . Hypertension   . High cholesterol   . Kidney disease   . Thyroid disease   . UTI (urinary tract infection)   . Chest pain summer 2012  . Diverticulosis     difficulty with colonoscopy 2005    Past Surgical History  Procedure Laterality Date  . Abdominal hysterectomy  1989    Family History  Problem Relation Age of Onset  . Colon cancer Other   . Lung cancer      grandparent  . Stroke    . Cancer Mother     liver  . Stroke Father 38  . Diabetes Father   . Hypertension Father     History   Social History  . Marital Status: Married    Spouse Name: N/A    Number of Children: N/A  . Years of Education: N/A   Social History Main Topics  . Smoking status: Never Smoker   . Smokeless tobacco: None  . Alcohol Use: No  . Drug Use: No  . Sexual Activity: Yes   Other Topics Concern  . None   Social History Narrative   Work: recently retired Tree surgeon, Location manager - trained as vetrinarian      Home Situation:  Lives alone, no family around, good friends      Spiritual Beliefs: christian, very involved with her church - [employer]      Lifestyle:  exercising 5 days per week, trying to eat healthy             Current outpatient prescriptions:AMLODIPINE BESYLATE PO, Take 10 mg by mouth daily. , Disp: , Rfl: ;  aspirin 81 MG tablet, Take 81 mg by mouth daily., Disp: , Rfl: ;  ATENOLOL PO, Take 50 mg by mouth 2 (two) times daily. , Disp: , Rfl: ;  ATORVASTATIN CALCIUM PO, Take 40 mg by mouth at bedtime and may repeat dose one time if needed. , Disp: , Rfl: ;  b complex vitamins tablet, Take 1 tablet by mouth daily., Disp: , Rfl:  Biotin (BIOTIN 5000) 5 MG CAPS, Take by mouth daily., Disp: , Rfl: ;  Calcium Carbonate-Vitamin D (CALTRATE 600+D) 600-400 MG-UNIT per tablet, Take 1 tablet by mouth daily., Disp: , Rfl: ;  Cholecalciferol (VITAMIN D3) 2000 UNITS TABS, Take by mouth., Disp: , Rfl: ;  hydrochlorothiazide (HYDRODIURIL) 25 MG tablet, Take 25 mg by mouth daily. , Disp: , Rfl:  Insulin Aspart (NOVOLOG [place]), Inject 8 Units into the skin as directed. And sliding scale based on readings., Disp: , Rfl: ;  insulin glargine (LANTUS) 100 UNIT/ML injection, Inject 30 Units into the skin at bedtime. , Disp: ,  Rfl: ;  levothyroxine (SYNTHROID, LEVOTHROID) 75 MCG tablet, Take 75 mcg by mouth daily., Disp: , Rfl: ;  METFORMIN HCL PO, Take 500 mg by mouth 2 (two) times daily. , Disp: , Rfl:  Multiple Vitamins-Minerals (CENTRUM SILVER PO), Take by mouth daily., Disp: , Rfl: ;  ONETOUCH DELICA LANCETS MISC, , Disp: , Rfl: ;  ONETOUCH VERIO IQ test strip, , Disp: , Rfl: ;  Pioglitazone HCl (ACTOS PO), Take 45 mg by mouth daily. , Disp: , Rfl: ;  Valsartan (DIOVAN PO), Take 320 mg by mouth daily. , Disp: , Rfl: ;  Vitamin D, Ergocalciferol, (DRISDOL) 50000 UNITS CAPS, Take 50,000 Units by mouth., Disp: , Rfl:   EXAM:  Filed Vitals:   07/03/13 0954  BP: 110/70  Temp: 98.6 F (37 C)    Body mass index is 29.11 kg/(m^2).  GENERAL: vitals reviewed and listed above, alert, oriented, appears well hydrated and in no acute distress  HEENT: atraumatic,  conjunttiva clear, no obvious abnormalities on inspection of external nose and ears  NECK: no obvious masses on inspection  LUNGS: clear to auscultation bilaterally, no wheezes, rales or rhonchi, good air movement  CV: HRRR, no peripheral edema  MS: moves all extremities without noticeable abnormality  PSYCH: pleasant and cooperative, no obvious depression or anxiety  ASSESSMENT AND PLAN:  Discussed the following assessment and plan:  Hypertension  Hypothyroid, s/p radioactive iodine tx for graves 2001  Hyperlipemia  DM (diabetes mellitus), type 2 with renal complications  CKD (chronic kidney disease) stage 3, GFR 30-59 ml/min  -reviewed endo notes -she reports had flu shot, foot exam etc with endocrine -she is doing great - and congratulated on progress -stable -follow up in 6 months -Patient advised to return or notify a doctor immediately if symptoms worsen or persist or new concerns arise.  There are no Patient Instructions on file for this visit.   Kriste Basque R.

## 2013-07-08 ENCOUNTER — Ambulatory Visit (INDEPENDENT_AMBULATORY_CARE_PROVIDER_SITE_OTHER): Payer: Medicare Other | Admitting: Cardiovascular Disease

## 2013-07-08 ENCOUNTER — Encounter: Payer: Self-pay | Admitting: Cardiovascular Disease

## 2013-07-08 VITALS — BP 112/62 | HR 65 | Ht 66.5 in | Wt 180.7 lb

## 2013-07-08 DIAGNOSIS — E785 Hyperlipidemia, unspecified: Secondary | ICD-10-CM

## 2013-07-08 DIAGNOSIS — I10 Essential (primary) hypertension: Secondary | ICD-10-CM

## 2013-07-08 NOTE — Assessment & Plan Note (Signed)
On statin therapy. Her most recent lipid profile performed by Dr. Kyra Searles 06/13/13 revealed total cholesterol of  170, LDL 91 and HDL of 66

## 2013-07-08 NOTE — Patient Instructions (Addendum)
Your physician wants you to follow-up in 12 months  Dr Allyson Sabal.  You will receive a reminder letter in the mail two months in advance. If you don't receive a letter, please call our office to schedule the follow-up appointment.  Continue with current medication

## 2013-07-08 NOTE — Progress Notes (Signed)
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 Her risk factor profile is remarkable for diabetes, hypertension and hyperlipidemia. She had a Myoview performed 2 years ago which showed breast attenuation artifact. She really denies chest pain. Pamela Patton has checked her blood workmost recently 06/13/13 revealed a total cholesterol 170, LDL of 91 and HDL of 66.   Current Outpatient Prescriptions  Medication Sig Dispense Refill  . AMLODIPINE BESYLATE PO Take 10 mg by mouth daily.       Marland Kitchen aspirin 81 MG tablet Take 81 mg by mouth daily.      . ATENOLOL PO Take 50 mg by mouth 2 (two) times daily.       . ATORVASTATIN CALCIUM PO Take 40 mg by mouth at bedtime and may repeat dose one time if needed.       . Biotin (BIOTIN 5000) 5 MG CAPS Take by mouth daily.      . Calcium Carbonate-Vitamin D (CALTRATE 600+D) 600-400 MG-UNIT per tablet Take 1 tablet by mouth daily.      . Cholecalciferol (VITAMIN D3) 2000 UNITS TABS Take by mouth.      . hydrochlorothiazide (HYDRODIURIL) 25 MG tablet Take 25 mg by mouth daily.       . Insulin Aspart (NOVOLOG [place]) Inject 8 Units into the skin as directed. And sliding scale based on readings.      . insulin glargine (LANTUS) 100 UNIT/ML injection Inject 35 Units into the skin at bedtime.       Marland Kitchen levothyroxine (SYNTHROID, LEVOTHROID) 75 MCG tablet Take 75 mcg by mouth daily.      Marland Kitchen METFORMIN HCL PO Take 500 mg by mouth 2 (two) times daily.       . Multiple Vitamins-Minerals (CENTRUM SILVER PO) Take by mouth daily.      Pamela Patton DELICA LANCETS MISC       . ONETOUCH VERIO IQ test strip       . Pioglitazone HCl (ACTOS PO) Take 45 mg by mouth  daily.       . Valsartan (DIOVAN PO) Take 320 mg by mouth daily.       Marland Kitchen b complex vitamins tablet Take 1 tablet by mouth daily.       No current facility-administered medications for this visit.    No Known Allergies  History   Social History  . Marital Status: Married    Spouse Name: N/A    Number of Children: N/A  . Years of Education: N/A   Occupational History  . Not on file.   Social History Main Topics  . Smoking status: Never Smoker   . Smokeless tobacco: Not on file  . Alcohol Use: No  . Drug Use: No  . Sexual Activity: Yes   Other Topics Concern  . Not on file   Social History Narrative   Work: recently retired Tree surgeon, Location manager - trained as vetrinarian      Home Situation:  Lives alone, no family around, good friends      Spiritual Beliefs: christian, very involved with her church - [employer] [employer]      Lifestyle: exercising 5 days per week, trying to eat  healthy              Review of Patton: General: negative for chills, fever, night sweats or weight changes.  Cardiovascular: negative for chest pain, dyspnea on exertion, edema, orthopnea, palpitations, paroxysmal nocturnal dyspnea or shortness of breath Dermatological: negative for rash Respiratory: negative for cough or wheezing Urologic: negative for hematuria Abdominal: negative for nausea, vomiting, diarrhea, bright red blood per rectum, melena, or hematemesis Neurologic: negative for visual changes, syncope, or dizziness All other Patton reviewed and are otherwise negative except as noted above.    Blood pressure 112/62, pulse 65, height 5' 6.5" (1.689 m), weight 180 lb 11.2 oz (81.965 kg).  General appearance: alert and no distress Neck: no adenopathy, no carotid bruit, no JVD, supple, symmetrical, trachea midline and thyroid not enlarged, symmetric, no tenderness/mass/nodules Lungs: clear to auscultation bilaterally Heart: regular rate and rhythm, S1, S2 normal, no murmur,  click, rub or gallop Extremities: extremities normal, atraumatic, no cyanosis or edema  EKG normal sinus rhythm of 65 nonspecific ST and T wave changes  ASSESSMENT AND PLAN:   Hypertension Under good control and her medications  Hyperlipemia On statin therapy. Her most recent lipid profile performed by Dr. Kyra Patton 06/13/13 revealed total cholesterol of  170, LDL 91 and HDL of 66      Pamela Gess MD Pamela Patton, Pamela Patton 07/08/2013 2:18 PM

## 2013-07-08 NOTE — Assessment & Plan Note (Signed)
Under good control and her medications 

## 2013-07-09 ENCOUNTER — Ambulatory Visit: Payer: Medicare Other | Admitting: Family Medicine

## 2013-08-08 ENCOUNTER — Encounter: Payer: Self-pay | Admitting: Cardiovascular Disease

## 2013-09-05 ENCOUNTER — Telehealth: Payer: Self-pay

## 2013-09-05 NOTE — Telephone Encounter (Signed)
Received a fax from [facility] requesting information from pt's most recent visit.  Per Dr. Selena Batten called pt to notify that the insurance was requesting the documents.  If pt would like to provide this to her insurance company she can contact medical records, sign a release and have this information sent to them.    Called and spoke with pt and pt states she will come in sign a release and have this sent to insurance.

## 2013-09-20 ENCOUNTER — Encounter: Payer: Self-pay | Admitting: Family Medicine

## 2013-09-20 NOTE — Progress Notes (Signed)
Received office notes from [facility] from visit on 09/19/13.  Pt seen for follow up and evaluation and management of chronic problems.  Adjustments to insulin made.  Follow up with diabetes educator Cristy Folks.  Continue pioglitazone 45 mg, metfomin er 500  bid, lantus 30 units qhs; adjust in 5 units increments targeting mostly less than 120 acb, novolog per patient instructions.  Follow up in 3 months for fasting labs and 3 month office visit.

## 2013-12-01 LAB — HM DIABETES EYE EXAM

## 2014-01-01 ENCOUNTER — Ambulatory Visit: Payer: Medicare Other | Admitting: Family Medicine

## 2014-01-07 NOTE — Progress Notes (Signed)
Received office notes from [facility] from visit on 3.26.15.  Patient seen for follow up evaluation and management of chronic problems.  Diabetes severely out of contorl with HbA1C up from 7.0 in 10/12 to 12.6 in 2/13.  Follow up in 3 months for fasting labs and office visit.  Sent to scan.

## 2014-01-09 ENCOUNTER — Other Ambulatory Visit: Payer: Self-pay

## 2014-01-09 DIAGNOSIS — Z1231 Encounter for screening mammogram for malignant neoplasm of breast: Secondary | ICD-10-CM

## 2014-01-29 ENCOUNTER — Ambulatory Visit
Admission: RE | Admit: 2014-01-29 | Discharge: 2014-01-29 | Disposition: A | Discharge status: Home / Self Care | Payer: Medicare Other | Source: Ambulatory Visit

## 2014-01-29 ENCOUNTER — Encounter (INDEPENDENT_AMBULATORY_CARE_PROVIDER_SITE_OTHER): Payer: Self-pay

## 2014-01-29 DIAGNOSIS — Z1231 Encounter for screening mammogram for malignant neoplasm of breast: Secondary | ICD-10-CM

## 2014-02-06 ENCOUNTER — Ambulatory Visit (INDEPENDENT_AMBULATORY_CARE_PROVIDER_SITE_OTHER): Payer: Medicare Other | Admitting: Family Medicine

## 2014-02-06 ENCOUNTER — Encounter: Payer: Self-pay | Admitting: Family Medicine

## 2014-02-06 VITALS — BP 116/72 | HR 64 | Temp 98.5°F | Ht 66.5 in | Wt 180.5 lb

## 2014-02-06 DIAGNOSIS — E785 Hyperlipidemia, unspecified: Secondary | ICD-10-CM

## 2014-02-06 DIAGNOSIS — N183 Chronic kidney disease, stage 3 unspecified: Secondary | ICD-10-CM

## 2014-02-06 DIAGNOSIS — I10 Essential (primary) hypertension: Secondary | ICD-10-CM

## 2014-02-06 DIAGNOSIS — E039 Hypothyroidism, unspecified: Secondary | ICD-10-CM

## 2014-02-06 DIAGNOSIS — E1129 Type 2 diabetes mellitus with other diabetic kidney complication: Secondary | ICD-10-CM

## 2014-02-06 NOTE — Patient Instructions (Addendum)
FOR YOUR DIABETES:  []  Eat a healthy low carb diet (avoid sweets, sweet drinks, breads, potatoes, rice, etc.) and ensure 3 small meals plus healthy snacks daily.  []  Get AT LEAST 150 minutes of cardiovascular exercise per week - 30 minutes per day is best of sustained sweaty exercise.  []  Take all of your medications every day as directed by your doctor. Call your doctor immediately if you have any questions about your medications.  []  Check your blood sugar often and when you feel unwell and keep a log to bring to all health appointments. FASTING: before you eat anything in the morning POSTPRANDIAL: 1-2 hours after a meal  []  If any low blood sugars < 70, eat a snack and call your diabetes doctor immediately.  []  See an eye doctor every year and fax your diabetic eye exam to our office. Fax: [phone]  []  Take good care of your feet and keep them soft and callous free. Check your feet daily and wear comfortable shoes. See your doctor immediately if you have any cuts, calluses or wounds on your feet.   Follow up for your MEDICARE WELLNESS EXAM IN 3-4 MONTHS

## 2014-02-06 NOTE — Progress Notes (Signed)
No chief complaint on file.   HPI:  Follow up:  DM, stage 3 kidney disease and hpothyroid:  -followed by Dr. Elyse Hsu in [place] - reviewed recent OV summary -sees Dr. Enriqueta Shutter in optho  -on asa, lantus 30 u, novolog, metformin 500bid, valsartan, actos 45mg  and synthroid, Vit D  -stable  -she is doing regular exercise and eating healthy - she has made great progress with her weight  -HgbA1c 6.9 on 3/15 on review of labs from endocrine  HTN/HLD:  -followed by Dr. Gwenlyn Found, reviewed OV note from October -on asa, norvasc, atenolol, atorvastatin, hctz, valsartan  -stable -cholesterol at goal 3/15 labs reviewed from endocrine  HM: UTD, last medicare exam 10/2012  ROS: See pertinent positives and negatives per HPI.  Past Medical History  Diagnosis Date  . Diabetes mellitus without complication   . Arthritis   . Cancer   . Chicken pox   . GERD (gastroesophageal reflux disease)   . Hypertension   . High cholesterol   . Kidney disease   . Thyroid disease   . UTI (urinary tract infection)   . Chest pain summer 2012  . Diverticulosis     difficulty with colonoscopy 2005    Past Surgical History  Procedure Laterality Date  . Abdominal hysterectomy  1989    Family History  Problem Relation Age of Onset  . Colon cancer Other   . Lung cancer      grandparent  . Stroke    . Cancer Mother     liver  . Stroke Father 10  . Diabetes Father   . Hypertension Father     History   Social History  . Marital Status: Married    Spouse Name: N/A    Number of Children: N/A  . Years of Education: N/A   Social History Main Topics  . Smoking status: Never Smoker   . Smokeless tobacco: None  . Alcohol Use: No  . Drug Use: No  . Sexual Activity: Yes   Other Topics Concern  . None   Social History Narrative   Work: recently retired Firefighter, Veterinary surgeon - trained as vetrinarian      Home Situation:  Lives alone, no family around, good friends      Spiritual  Beliefs: christian, very involved with her church - [employer]: exercising 5 days per week, trying to eat healthy             Current outpatient prescriptions:AMLODIPINE BESYLATE PO, Take 10 mg by mouth daily. , Disp: , Rfl: ;  aspirin 81 MG tablet, Take 81 mg by mouth daily., Disp: , Rfl: ;  ATENOLOL PO, Take 50 mg by mouth 2 (two) times daily. , Disp: , Rfl: ;  ATORVASTATIN CALCIUM PO, Take 40 mg by mouth at bedtime and may repeat dose one time if needed. , Disp: , Rfl: ;  b complex vitamins tablet, Take 1 tablet by mouth daily., Disp: , Rfl:  Biotin (BIOTIN 5000) 5 MG CAPS, Take by mouth daily., Disp: , Rfl: ;  Calcium Carbonate-Vitamin D (CALTRATE 600+D) 600-400 MG-UNIT per tablet, Take 1 tablet by mouth daily., Disp: , Rfl: ;  Cholecalciferol (VITAMIN D3) 2000 UNITS TABS, Take by mouth., Disp: , Rfl: ;  hydrochlorothiazide (HYDRODIURIL) 25 MG tablet, Take 25 mg by mouth daily. , Disp: , Rfl:  Insulin Aspart (NOVOLOG [place]), Inject 8 Units into the skin as directed. And sliding scale based on readings., Disp: ,  Rfl: ;  insulin glargine (LANTUS) 100 UNIT/ML injection, Inject 35 Units into the skin at bedtime. , Disp: , Rfl: ;  levothyroxine (SYNTHROID, LEVOTHROID) 75 MCG tablet, Take 75 mcg by mouth daily., Disp: , Rfl: ;  METFORMIN HCL PO, Take 500 mg by mouth 2 (two) times daily. , Disp: , Rfl:  Multiple Vitamins-Minerals (CENTRUM SILVER PO), Take by mouth daily., Disp: , Rfl: ;  ONETOUCH DELICA LANCETS MISC, , Disp: , Rfl: ;  ONETOUCH VERIO IQ test strip, , Disp: , Rfl: ;  Pioglitazone HCl (ACTOS PO), Take 45 mg by mouth daily. , Disp: , Rfl: ;  Valsartan (DIOVAN PO), Take 320 mg by mouth daily. , Disp: , Rfl:   EXAM:  Filed Vitals:   02/06/14 1256  BP: 116/72  Pulse: 64  Temp: 98.5 F (36.9 C)    Body mass index is 28.7 kg/(m^2).  GENERAL: vitals reviewed and listed above, alert, oriented, appears well hydrated and in no acute distress  HEENT: atraumatic,  conjunttiva clear, no obvious abnormalities on inspection of external nose and ears  NECK: no obvious masses on inspection  LUNGS: clear to auscultation bilaterally, no wheezes, rales or rhonchi, good air movement  CV: HRRR, SEM II/VI no peripheral edema  MS: moves all extremities without noticeable abnormality  PSYCH: pleasant and cooperative, no obvious depression or anxiety  ASSESSMENT AND PLAN:  Discussed the following assessment and plan:  Hypertension  Hypothyroid, s/p radioactive iodine tx for graves 2001  Hyperlipemia  DM (diabetes mellitus), type 2 with renal complications  CKD (chronic kidney disease) stage 3, GFR 30-59 ml/min  -reviewed and supported on diabetes, weight, cholesterol, HTN management and discussed importance of healthy lifestyle -medicare exam in 3-4 months -reviewed mammogram report -Patient advised to return or notify a doctor immediately if symptoms worsen or persist or new concerns arise.  Patient Instructions  FOR YOUR DIABETES:  []  Eat a healthy low carb diet (avoid sweets, sweet drinks, breads, potatoes, rice, etc.) and ensure 3 small meals plus healthy snacks daily.  []  Get AT LEAST 150 minutes of cardiovascular exercise per week - 30 minutes per day is best of sustained sweaty exercise.  []  Take all of your medications every day as directed by your doctor. Call your doctor immediately if you have any questions about your medications.  []  Check your blood sugar often and when you feel unwell and keep a log to bring to all health appointments. FASTING: before you eat anything in the morning POSTPRANDIAL: 1-2 hours after a meal  []  If any low blood sugars < 70, eat a snack and call your diabetes doctor immediately.  []  See an eye doctor every year and fax your diabetic eye exam to our office. Fax: [phone]  []  Take good care of your feet and keep them soft and callous free. Check your feet daily and wear comfortable shoes. See your  doctor immediately if you have any cuts, calluses or wounds on your feet.   Follow up for your [place] 3-4 [place] R. Leshia Kope

## 2014-02-06 NOTE — Progress Notes (Signed)
Pre visit review using our clinic review tool, if applicable. No additional management support is needed unless otherwise documented below in the visit note. 

## 2014-02-07 ENCOUNTER — Telehealth: Payer: Self-pay | Admitting: Family Medicine

## 2014-02-07 NOTE — Telephone Encounter (Signed)
Relevant patient education assigned to patient using Emmi. ° °

## 2014-02-14 ENCOUNTER — Ambulatory Visit: Payer: Medicare Other | Admitting: Family Medicine

## 2014-02-14 ENCOUNTER — Ambulatory Visit (INDEPENDENT_AMBULATORY_CARE_PROVIDER_SITE_OTHER): Payer: Medicare Other | Admitting: Internal Medicine

## 2014-02-14 ENCOUNTER — Encounter: Payer: Self-pay | Admitting: Internal Medicine

## 2014-02-14 VITALS — BP 110/72 | HR 67 | Temp 98.7°F | Ht 66.5 in | Wt 180.0 lb

## 2014-02-14 DIAGNOSIS — N766 Ulceration of vulva: Secondary | ICD-10-CM

## 2014-02-14 MED ORDER — MUPIROCIN CALCIUM 2 % EX CREA: 1.0000 "application " | TOPICAL_CREAM | Freq: Three times a day (TID) | CUTANEOUS | Status: DC

## 2014-02-14 NOTE — Patient Instructions (Signed)
Looks like  Superficial blister that burst.  And no other sx seen.  However I  Advise it be checked in about a week to make sure it is healing and going away.   For now avoid trauma  And try antibiotic ointment If not getting better consider derm /gyne evaluation

## 2014-02-14 NOTE — Progress Notes (Signed)
Chief Complaint  Patient presents with  . Other    vulvar bumbp    HPI: Patient comes in today for SDA for  new problem evaluation. PCP NA  Wiped irritation  When went ot br and noted a bump like a early boil and then ? Larger.  Used warm water compress  Finally looked at it  A "collapsed chocoholate chip cookie. " No dc fever adenopathy. May have had minial trauma ic before onset   Monogamous  ROS: See pertinent positives and negatives per HPI. No uti abd pain   Past Medical History  Diagnosis Date  . Diabetes mellitus without complication   . Arthritis   . Cancer   . Chicken pox   . GERD (gastroesophageal reflux disease)   . Hypertension   . High cholesterol   . Kidney disease   . Thyroid disease   . UTI (urinary tract infection)   . Chest pain summer 2012  . Diverticulosis     difficulty with colonoscopy 2005    Family History  Problem Relation Age of Onset  . Colon cancer Other   . Lung cancer      grandparent  . Stroke    . Cancer Mother     liver  . Stroke Father 31  . Diabetes Father   . Hypertension Father     History   Social History  . Marital Status: Married    Spouse Name: N/A    Number of Children: N/A  . Years of Education: N/A   Social History Main Topics  . Smoking status: Never Smoker   . Smokeless tobacco: None  . Alcohol Use: No  . Drug Use: No  . Sexual Activity: Yes   Other Topics Concern  . None   Social History Narrative   Work: recently retired Firefighter, Veterinary surgeon - trained as vetrinarian      Home Situation:  Lives alone, no family around, good friends      Spiritual Beliefs: christian, very involved with her church - [employer]: exercising 5 days per week, trying to eat healthy             Outpatient Encounter Prescriptions as of 02/14/2014  Medication Sig  . AMLODIPINE BESYLATE PO Take 10 mg by mouth daily.   Marland Kitchen aspirin 81 MG tablet Take 81 mg by mouth daily.  . ATENOLOL PO Take  50 mg by mouth 2 (two) times daily.   . ATORVASTATIN CALCIUM PO Take 40 mg by mouth at bedtime and may repeat dose one time if needed.   Marland Kitchen b complex vitamins tablet Take 1 tablet by mouth daily.  . Biotin (BIOTIN 5000) 5 MG CAPS Take by mouth daily.  . Calcium Carbonate-Vitamin D (CALTRATE 600+D) 600-400 MG-UNIT per tablet Take 1 tablet by mouth daily.  . Cholecalciferol (VITAMIN D3) 2000 UNITS TABS Take by mouth.  . hydrochlorothiazide (HYDRODIURIL) 25 MG tablet Take 25 mg by mouth daily.   . Insulin Aspart (NOVOLOG [place]) Inject 8 Units into the skin as directed. And sliding scale based on readings.  . insulin glargine (LANTUS) 100 UNIT/ML injection Inject 35 Units into the skin at bedtime.   Marland Kitchen levothyroxine (SYNTHROID, LEVOTHROID) 75 MCG tablet Take 75 mcg by mouth daily.  Marland Kitchen METFORMIN HCL PO Take 500 mg by mouth 2 (two) times daily.   . Multiple Vitamins-Minerals (CENTRUM SILVER PO) Take by mouth daily.  Glory Rosebush DELICA LANCETS MISC   .  ONETOUCH VERIO IQ test strip   . Pioglitazone HCl (ACTOS PO) Take 45 mg by mouth daily.   . Valsartan (DIOVAN PO) Take 320 mg by mouth daily.   . mupirocin cream (BACTROBAN) 2 % Apply 1 application topically 3 (three) times daily.    EXAM:  BP 110/72  Pulse 67  Temp(Src) 98.7 F (37.1 C) (Oral)  Ht 5' 6.5" (1.689 m)  Wt 180 lb (81.647 kg)  BMI 28.62 kg/m2  SpO2 98%  Body mass index is 28.62 kg/(m^2).  GENERAL: vitals reviewed and listed above, alert, oriented, appears well hydrated and in no acute distress HEENT: atraumatic, , no obvious abnormalities  EXT GU   Right labia with aa 3-64mm superficial blister like lesion  Flat base no exudate smooth surface right vulva  no other lesions no mass effect  No punched out borders or heaped up borders and no inguinal  adenopathy PSYCH: pleasant and cooperative, no obvious depression or anxiety  ASSESSMENT AND PLAN:  Discussed the following assessment and plan:  Vulvar ulcer - post healing traumatic ?    follow up in 7- 10 days to ensure resolution.   -Patient advised to return or notify health care team  if symptoms worsen ,persist or new concerns arise.  Patient Instructions  Looks like  Superficial blister that burst.  And no other sx seen.  However I  Advise it be checked in about a week to make sure it is healing and going away.   For now avoid trauma  And try antibiotic ointment If not getting better consider derm /gyne evaluation  Standley Brooking. Rosselyn Martha M.D.

## 2014-02-14 NOTE — Progress Notes (Signed)
Pre visit review using our clinic review tool, if applicable. No additional management support is needed unless otherwise documented below in the visit note. 

## 2014-02-25 ENCOUNTER — Ambulatory Visit (INDEPENDENT_AMBULATORY_CARE_PROVIDER_SITE_OTHER): Payer: Medicare Other | Admitting: Family Medicine

## 2014-02-25 ENCOUNTER — Encounter: Payer: Self-pay | Admitting: Family Medicine

## 2014-02-25 ENCOUNTER — Telehealth: Payer: Self-pay

## 2014-02-25 VITALS — BP 120/72 | HR 65 | Temp 98.9°F | Ht 66.5 in | Wt 178.5 lb

## 2014-02-25 DIAGNOSIS — L989 Disorder of the skin and subcutaneous tissue, unspecified: Secondary | ICD-10-CM

## 2014-02-25 NOTE — Progress Notes (Signed)
No chief complaint on file.   HPI:  Follow up:  1)Vulvar lesion: -acute onset about 1-2 weeks ago -seen by Dr. Regis Bill on 5/15 and thought to be post-traumatic - 3-4 mm lesion R labia per ROC -told to follow up in 1 week for recheck and see gyn/derm if persisted -she reports: monogamous relationship, not worried for STI, resolved, noticed a little burning with sex last week -denies: other lesions, fevers, malaise, vaginal discharge   ROS: See pertinent positives and negatives per HPI.  Past Medical History  Diagnosis Date  . Diabetes mellitus without complication   . Arthritis   . Cancer   . Chicken pox   . GERD (gastroesophageal reflux disease)   . Hypertension   . High cholesterol   . Kidney disease   . Thyroid disease   . UTI (urinary tract infection)   . Chest pain summer 2012  . Diverticulosis     difficulty with colonoscopy 2005    Past Surgical History  Procedure Laterality Date  . Abdominal hysterectomy  1989    Family History  Problem Relation Age of Onset  . Colon cancer Other   . Lung cancer      grandparent  . Stroke    . Cancer Mother     liver  . Stroke Father 14  . Diabetes Father   . Hypertension Father     History   Social History  . Marital Status: Married    Spouse Name: N/A    Number of Children: N/A  . Years of Education: N/A   Social History Main Topics  . Smoking status: Never Smoker   . Smokeless tobacco: None  . Alcohol Use: No  . Drug Use: No  . Sexual Activity: Yes   Other Topics Concern  . None   Social History Narrative   Work: recently retired Firefighter, Veterinary surgeon - trained as vetrinarian      Home Situation:  Lives alone, no family around, good friends      Spiritual Beliefs: christian, very involved with her church - [employer]: exercising 5 days per week, trying to eat healthy             Current outpatient prescriptions:AMLODIPINE BESYLATE PO, Take 10 mg by mouth daily. ,  Disp: , Rfl: ;  aspirin 81 MG tablet, Take 81 mg by mouth daily., Disp: , Rfl: ;  ATENOLOL PO, Take 50 mg by mouth 2 (two) times daily. , Disp: , Rfl: ;  ATORVASTATIN CALCIUM PO, Take 40 mg by mouth at bedtime and may repeat dose one time if needed. , Disp: , Rfl: ;  b complex vitamins tablet, Take 1 tablet by mouth daily., Disp: , Rfl:  Biotin (BIOTIN 5000) 5 MG CAPS, Take by mouth daily., Disp: , Rfl: ;  Calcium Carbonate-Vitamin D (CALTRATE 600+D) 600-400 MG-UNIT per tablet, Take 1 tablet by mouth daily., Disp: , Rfl: ;  Cholecalciferol (VITAMIN D3) 2000 UNITS TABS, Take by mouth., Disp: , Rfl: ;  hydrochlorothiazide (HYDRODIURIL) 25 MG tablet, Take 25 mg by mouth daily. , Disp: , Rfl:  Insulin Aspart (NOVOLOG [place]), Inject 8 Units into the skin as directed. And sliding scale based on readings., Disp: , Rfl: ;  insulin glargine (LANTUS) 100 UNIT/ML injection, Inject 35 Units into the skin at bedtime. , Disp: , Rfl: ;  levothyroxine (SYNTHROID, LEVOTHROID) 75 MCG tablet, Take 75 mcg by mouth daily., Disp: , Rfl: ;  METFORMIN HCL  PO, Take 500 mg by mouth 2 (two) times daily. , Disp: , Rfl:  Multiple Vitamins-Minerals (CENTRUM SILVER PO), Take by mouth daily., Disp: , Rfl: ;  mupirocin cream (BACTROBAN) 2 %, Apply 1 application topically 3 (three) times daily., Disp: 15 g, Rfl: 0;  ONETOUCH DELICA LANCETS MISC, , Disp: , Rfl: ;  ONETOUCH VERIO IQ test strip, , Disp: , Rfl: ;  Pioglitazone HCl (ACTOS PO), Take 45 mg by mouth daily. , Disp: , Rfl: ;  Valsartan (DIOVAN PO), Take 320 mg by mouth daily. , Disp: , Rfl:   EXAM:  Filed Vitals:   02/25/14 1324  BP: 120/72  Pulse: 65  Temp: 98.9 F (37.2 C)    Body mass index is 28.38 kg/(m^2).  GENERAL: vitals reviewed and listed above, alert, oriented, appears well hydrated and in no acute distress  HEENT: atraumatic, conjunttiva clear, no obvious abnormalities on inspection of external nose and ears  GU: very small area ? hypopigmented skin R outer upper  labia in area she points to - no ulceration or skin lesion noted otherwise  MS: moves all extremities without noticeable abnormality  PSYCH: pleasant and cooperative, no obvious depression or anxiety  ASSESSMENT AND PLAN:  Discussed the following assessment and plan:  Skin lesion  -we discussed possible serious and likely etiologies, workup and treatment, treatment risks and return precautions - not much is found on exam today other then sm amount of hypopigmentation which may be s/p healing - offered derm referral in case hypopigmentation of any concern -after this discussion, [place] opted for observation and follow up with derm if persists or recurs -of course, we advised [place]  to return or notify a doctor immediately if symptoms worsen or persist or new concerns arise.   Patient Instructions  -if bothers you again please see the dermatologist  -follow up in 3 months for Medicare Annual exeam     Lucretia Kern

## 2014-02-25 NOTE — Telephone Encounter (Signed)
Relevant patient education assigned to patient using Emmi. ° °

## 2014-02-25 NOTE — Patient Instructions (Signed)
-  if bothers you again please see the dermatologist  -follow up in 3 months for Medicare Annual exeam

## 2014-02-25 NOTE — Progress Notes (Signed)
Pre visit review using our clinic review tool, if applicable. No additional management support is needed unless otherwise documented below in the visit note. 

## 2014-03-31 ENCOUNTER — Encounter: Payer: Self-pay | Admitting: *Deleted

## 2014-06-10 ENCOUNTER — Ambulatory Visit: Payer: Medicare Other | Admitting: Family Medicine

## 2014-06-17 ENCOUNTER — Encounter: Payer: Self-pay | Admitting: Family Medicine

## 2014-06-17 ENCOUNTER — Ambulatory Visit (INDEPENDENT_AMBULATORY_CARE_PROVIDER_SITE_OTHER): Payer: Medicare Other | Admitting: Family Medicine

## 2014-06-17 VITALS — BP 116/72 | HR 64 | Temp 98.1°F | Ht 66.5 in | Wt 172.0 lb

## 2014-06-17 DIAGNOSIS — Z23 Encounter for immunization: Secondary | ICD-10-CM

## 2014-06-17 DIAGNOSIS — E785 Hyperlipidemia, unspecified: Secondary | ICD-10-CM

## 2014-06-17 DIAGNOSIS — E1129 Type 2 diabetes mellitus with other diabetic kidney complication: Secondary | ICD-10-CM

## 2014-06-17 DIAGNOSIS — E2839 Other primary ovarian failure: Secondary | ICD-10-CM

## 2014-06-17 DIAGNOSIS — I10 Essential (primary) hypertension: Secondary | ICD-10-CM

## 2014-06-17 DIAGNOSIS — E559 Vitamin D deficiency, unspecified: Secondary | ICD-10-CM

## 2014-06-17 DIAGNOSIS — E039 Hypothyroidism, unspecified: Secondary | ICD-10-CM

## 2014-06-17 DIAGNOSIS — Z0000 Encounter for general adult medical examination without abnormal findings: Secondary | ICD-10-CM

## 2014-06-17 LAB — LIPID PANEL
Cholesterol: 154 mg/dL (ref 0–200)
HDL: 59.8 mg/dL (ref >39.00)
LDL Cholesterol: 82 mg/dL (ref 0–99)
NonHDL: 94.2
Total CHOL/HDL Ratio: 3
Triglycerides: 59 mg/dL (ref 0.0–149.0)
VLDL: 11.8 mg/dL (ref 0.0–40.0)

## 2014-06-17 LAB — BASIC METABOLIC PANEL
BUN: 18 mg/dL (ref 6–23)
CO2: 29 mEq/L (ref 19–32)
Calcium: 9.9 mg/dL (ref 8.4–10.5)
Chloride: 103 mEq/L (ref 96–112)
Creatinine, Ser: 1.3 mg/dL — ABNORMAL HIGH (ref 0.4–1.2)
GFR: 53.06 mL/min — ABNORMAL LOW (ref >60.00)
Glucose, Bld: 114 mg/dL — ABNORMAL HIGH (ref 70–99)
Potassium: 4.4 mEq/L (ref 3.5–5.1)
SODIUM: 142 meq/L (ref 135–145)

## 2014-06-17 LAB — HEMOGLOBIN A1C: HEMOGLOBIN A1C: 6.9 % — AB (ref 4.6–6.5)

## 2014-06-17 LAB — TSH: TSH: 0.44 u[IU]/mL (ref 0.35–4.50)

## 2014-06-17 LAB — HM DIABETES FOOT EXAM

## 2014-06-17 NOTE — Patient Instructions (Signed)
Please see a lawyer and/or go to this website to help you with advanced directives and designating a health care power of attorney so that your wishes will be followed should you become too ill to make your own medical decisions.  [email or web address]  We sent a referral for the bone density test  -We have ordered labs or studies at this visit. It can take up to 1-2 weeks for results and processing. We will contact you with instructions IF your results are abnormal. Normal results will be released to your [facility]. If you have not heard from [place] or can not find your results in [facility] in 2 weeks please contact our office.  -PLEASE SIGN UP FOR MYCHART TODAY   We recommend the following healthy lifestyle measures: - eat a healthy diet consisting of lots of vegetables, fruits, beans, nuts, seeds, healthy meats such as white chicken and fish and whole grains.  - avoid fried foods, fast food, processed foods, sodas, red meet and other fattening foods.  - get a least 150 minutes of aerobic exercise per week.   Follow up in: 6 months or as needed

## 2014-06-17 NOTE — Progress Notes (Signed)
Pre visit review using our clinic review tool, if applicable. No additional management support is needed unless otherwise documented below in the visit note. 

## 2014-06-17 NOTE — Progress Notes (Signed)
Medicare Annual Preventive Care Visit  (initial annual wellness or annual wellness exam)  Concerns and/or follow up today:  DM, stage 3 kidney disease and hpothyroid:  -followed by Dr. Elyse Hsu in [place] - reviewed recent OV summary - seeing him in 2 weeks -sees Dr. Enriqueta Shutter in optho  -on asa, lantus 30 u, novolog, metformin 500bid, valsartan, actos 45mg  and synthroid, Vit D  -stable  -she is doing regular exercise and eating healthy - she has made great progress with her weight  -endocrine notes/labs from June obtained/reviewed: hgba1c 6.5, LDL 80  Foot exam:  Eye Exam : with Dr. Enriqueta Shutter - but he retired and she is switching, reports last exam 12/2013  HTN/HLD:  -followed by Dr. Gwenlyn Found, reviewed OV note from October  -on asa, norvasc, atenolol, atorvastatin, hctz, valsartan  -stable  -cholesterol at goal 3/15 labs reviewed from endocrine   ROS: negative for report of fevers, unintentional weight loss, vision changes, vision loss, hearing loss or change, chest pain, sob, hemoptysis, melena, hematochezia, hematuria, genital discharge or lesions, falls, bleeding or bruising, loc, thoughts of suicide or self harm, memory loss  1.) Patient-completed health risk assessment  - completed and reviewed, see scanned documentation  2.) Review of Medical History: -PMH, PSH, Family History and current specialty and care providers reviewed and updated and listed below  - see scanned in document in chart and below  Past Medical History  Diagnosis Date  . Arthritis   . Cancer   . Chicken pox   . GERD (gastroesophageal reflux disease)   . Hypertension   . High cholesterol   . Kidney disease   . UTI (urinary tract infection)   . Chest pain summer 2012  . Diverticulosis     difficulty with colonoscopy 2005  . 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 Elyse Hsu 10/08/2012  . Hypothyroid, s/p radioactive iodine tx for graves 2001 08/24/2012     Past Surgical History  Procedure Laterality Date  . Abdominal hysterectomy  1989    History   Social History  . Marital Status: Married    Spouse Name: N/A    Number of Children: N/A  . Years of Education: N/A   Occupational History  . Not on file.   Social History Main Topics  . Smoking status: Never Smoker   . Smokeless tobacco: Not on file  . Alcohol Use: No  . Drug Use: No  . Sexual Activity: Yes   Other Topics Concern  . 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  Always use your most recent med list.               ACCU-CHEK AVIVA device  by Other route. Use as instructed-Accu-chek Aviva Expert     ACTOS PO  Take 45 mg by mouth daily.     AMLODIPINE BESYLATE PO  Take 10 mg by mouth daily.  aspirin 81 MG tablet  Take 81 mg by mouth daily.     ATENOLOL PO  Take 50 mg by mouth 2 (two) times daily.     ATORVASTATIN CALCIUM PO  Take 40 mg by mouth at bedtime and may repeat dose one time if needed.     b complex vitamins tablet  Take 1 tablet by mouth daily.     BIOTIN 5000 5 MG Caps  Generic drug:  Biotin  Take by mouth daily.     CALTRATE 600+D 600-400 MG-UNIT per tablet  Generic drug:  Calcium Carbonate-Vitamin D  Take 1 tablet by mouth daily.     CENTRUM SILVER PO  Take by mouth daily.     DIOVAN PO  Take 320 mg by mouth daily.     hydrochlorothiazide 25 MG tablet  Commonly known as:  HYDRODIURIL  Take 25 mg by mouth daily.     insulin glargine 100 UNIT/ML injection  Commonly known as:  LANTUS  Inject 35 Units into  the skin at bedtime.     levothyroxine 75 MCG tablet  Commonly known as:  SYNTHROID, LEVOTHROID  Take 75 mcg by mouth daily.     METFORMIN HCL PO  Take 500 mg by mouth 2 (two) times daily.     mupirocin cream 2 %  Commonly known as:  BACTROBAN  Apply 1 application topically 3 (three) times daily.     NOVOLOG [place]  Inject 8 Units into the skin as directed. And sliding scale based on readings.     Vitamin D3 2000 UNITS Tabs  Take by mouth.        The patient has a family history of  3.) Review of functional ability and level of safety:  Any difficulty hearing? NO  History of falling?  NO  Any trouble with IADLs - using a phone, using transportation, grocery shopping, preparing meals, doing housework, doing laundry, taking medications and managing money?  NO  Advance Directives? NO  See summary of recommendations in Patient Instructions below.  4.) Physical Exam Filed Vitals:   06/17/14 0858  BP: 116/72  Pulse: 64  Temp: 98.1 F (36.7 C)   Estimated body mass index is 27.35 kg/(m^2) as calculated from the following:   Height as of this encounter: 5' 6.5" (1.689 m).   Weight as of this encounter: 172 lb (78.019 kg).  EKG (optional): deferred  General: alert, appear well hydrated and in no acute distress  HEENT: visual acuity grossly intact  CV: HRRR  Lungs: CTA bilaterally  Psych: pleasant and cooperative, no obvious depression or anxiety  Mini Cog: 1. Patient instructed to listen carefully and repeat the following: [place]  2. Clock drawing test was administered: NORMAL      3. Recall of three words 3/3  Scoring:  Patient Score: NEG   See patient instructions for recommendations.  Education and counseling regarding the above review of health provided with a plan for the following: -see scanned patient completed form for further details -fall prevention strategies discussed  -healthy lifestyle discussed -importance and resources  for completing advanced directives discussed -see patient instructions below for any other recommendations provided  4)The following written screening schedule of preventive measures were reviewed with assessment and plan made per below, orders and patient instructions:      AAA screening: n/a     Alcohol screening: done     Obesity Screening and counseling: done     STI screening: offered -  opted for HIV and RPR     Tobacco Screening: done       Pneumococcal (PPSV23 -one dose after 64, one before if risk factors), influenza yearly and hepatitis B vaccines (if high risk - end stage renal disease, IV drugs, homosexual men, live in home for mentally retarded, hemophilia receiving factors) ASSESSMENT/PLAN: completed, prevnar 13 today      Screening mammograph (yearly if >40) ASSESSMENT/PLAN: 01/2014 - completed      Screening Pap smear/pelvic exam (q2 years) ASSESSMENT/PLAN: N/A      Prostate cancer screening ASSESSMENT/PLAN: N/A      Colorectal cancer screening (FOBT yearly or flex sig q4y or colonoscopy q10y or barium enema q4y) ASSESSMENT/PLAN: completed in 2009      Diabetes outpatient self-management training services ASSESSMENT/PLAN: done      Bone mass measurements(covered q2y if indicated - estrogen def, osteoporosis, hyperparathyroid, vertebral abnormalities, osteoporosis or steroids) ASSESSMENT/PLAN: offered, reports normal in the past      Screening for glaucoma(q1y if high risk - diabetes, FH, AA and > 50 or hispanic and > 65) ASSESSMENT/PLAN: sees optho regularly      Medical nutritional therapy for individuals with diabetes or renal disease ASSESSMENT/PLAN: done - sees endocrinologist      Cardiovascular screening blood tests (lipids q5y) ASSESSMENT/PLAN: done      Diabetes screening tests ASSESSMENT/PLAN: doing today   7.) Summary:  1. Encounter for Medicare annual wellness exam -risk factors and conditions per above assessment were discussed and treatment,  recommendations and referrals were offered per documentation above and orders and patient instructions. - HIV antibody (with reflex) - RPR - DG Bone Density; Future  - Flu vaccine offered, she wants to get at endocrinology appt -Eye exam documented - FASTING LABS today - fax to Dr. Eden Emms per her request -prevnar 13 today  2. Essential hypertension - Basic metabolic panel -continue  3. Hypothyroidism, unspecified hypothyroidism type - TSH  4. Hyperlipemia - Lipid Panel  5. Vitamin D deficiency -Vit D daily  6. Type 2 diabetes mellitus with other diabetic kidney complication - Hemoglobin A1c - Microalbumin/Creatinine Ratio, Urine - Basic metabolic panel -fax to Dr. Eden Emms once obtained   There are no Patient Instructions on file for this visit.

## 2014-06-17 NOTE — Addendum Note (Signed)
Addended by: Agnes Lawrence on: 06/17/2014 09:47 AM   Modules accepted: Orders

## 2014-06-18 LAB — RPR

## 2014-06-18 LAB — MICROALBUMIN / CREATININE URINE RATIO
CREATININE, U: 113.8 mg/dL
Microalb Creat Ratio: 1 mg/g (ref 0.0–30.0)
Microalb, Ur: 1.1 mg/dL (ref 0.0–1.9)

## 2014-06-18 LAB — HIV ANTIBODY (ROUTINE TESTING W REFLEX): HIV 1&2 Ab, 4th Generation: NONREACTIVE

## 2014-07-04 ENCOUNTER — Encounter: Payer: Self-pay | Admitting: Family Medicine

## 2014-09-18 LAB — HM DIABETES EYE EXAM

## 2014-10-03 LAB — HM DIABETES EYE EXAM

## 2014-12-02 LAB — HEMOGLOBIN A1C: Hgb A1c MFr Bld: 6.7 % — AB (ref 4.0–6.0)

## 2015-03-30 ENCOUNTER — Other Ambulatory Visit: Payer: Self-pay

## 2015-04-02 ENCOUNTER — Encounter: Payer: Self-pay | Admitting: Family Medicine

## 2015-04-02 ENCOUNTER — Ambulatory Visit (INDEPENDENT_AMBULATORY_CARE_PROVIDER_SITE_OTHER): Payer: Medicare Other | Admitting: Family Medicine

## 2015-04-02 VITALS — BP 122/60 | HR 67 | Temp 98.3°F | Ht 66.5 in | Wt 159.2 lb

## 2015-04-02 DIAGNOSIS — F439 Reaction to severe stress, unspecified: Secondary | ICD-10-CM

## 2015-04-02 DIAGNOSIS — E1129 Type 2 diabetes mellitus with other diabetic kidney complication: Secondary | ICD-10-CM

## 2015-04-02 DIAGNOSIS — Z658 Other specified problems related to psychosocial circumstances: Secondary | ICD-10-CM

## 2015-04-02 DIAGNOSIS — N183 Chronic kidney disease, stage 3 unspecified: Secondary | ICD-10-CM

## 2015-04-02 DIAGNOSIS — I10 Essential (primary) hypertension: Secondary | ICD-10-CM

## 2015-04-02 DIAGNOSIS — E785 Hyperlipidemia, unspecified: Secondary | ICD-10-CM

## 2015-04-02 DIAGNOSIS — R634 Abnormal weight loss: Secondary | ICD-10-CM | POA: Diagnosis not present

## 2015-04-02 DIAGNOSIS — E039 Hypothyroidism, unspecified: Secondary | ICD-10-CM

## 2015-04-02 LAB — HEMOGLOBIN A1C: Hgb A1c MFr Bld: 7.1 % — ABNORMAL HIGH (ref 4.6–6.5)

## 2015-04-02 LAB — COMPREHENSIVE METABOLIC PANEL
ALT: 14 U/L (ref 0–35)
AST: 19 U/L (ref 0–37)
Albumin: 4.6 g/dL (ref 3.5–5.2)
Alkaline Phosphatase: 63 U/L (ref 39–117)
BUN: 24 mg/dL — ABNORMAL HIGH (ref 6–23)
CO2: 31 mEq/L (ref 19–32)
CREATININE: 1.15 mg/dL (ref 0.40–1.20)
Calcium: 10.3 mg/dL (ref 8.4–10.5)
Chloride: 104 mEq/L (ref 96–112)
GFR: 59.91 mL/min — AB (ref >60.00)
Glucose, Bld: 154 mg/dL — ABNORMAL HIGH (ref 70–99)
Potassium: 4.2 mEq/L (ref 3.5–5.1)
Sodium: 143 mEq/L (ref 135–145)
Total Bilirubin: 0.6 mg/dL (ref 0.2–1.2)
Total Protein: 8 g/dL (ref 6.0–8.3)

## 2015-04-02 LAB — CBC WITH DIFFERENTIAL/PLATELET
BASOS ABS: 0 10*3/uL (ref 0.0–0.1)
BASOS PCT: 0.4 % (ref 0.0–3.0)
Eosinophils Absolute: 0 10*3/uL (ref 0.0–0.7)
Eosinophils Relative: 0.5 % (ref 0.0–5.0)
HEMATOCRIT: 36.2 % (ref 36.0–46.0)
HEMOGLOBIN: 11.8 g/dL — AB (ref 12.0–15.0)
LYMPHS ABS: 1.5 10*3/uL (ref 0.7–4.0)
LYMPHS PCT: 21 % (ref 12.0–46.0)
MCHC: 32.5 g/dL (ref 30.0–36.0)
MCV: 89.9 fl (ref 78.0–100.0)
MONOS PCT: 5.8 % (ref 3.0–12.0)
Monocytes Absolute: 0.4 10*3/uL (ref 0.1–1.0)
Neutro Abs: 5.1 10*3/uL (ref 1.4–7.7)
Neutrophils Relative %: 72.3 % (ref 43.0–77.0)
PLATELETS: 258 10*3/uL (ref 150.0–400.0)
RBC: 4.03 Mil/uL (ref 3.87–5.11)
RDW: 15.2 % (ref 11.5–15.5)
WBC: 7 10*3/uL (ref 4.0–10.5)

## 2015-04-02 LAB — TSH: TSH: 0.58 u[IU]/mL (ref 0.35–4.50)

## 2015-04-02 NOTE — Progress Notes (Signed)
HPI:  Pamela Patton is a 69 yo F with PMH sig for DM, GERD, CKD, hypothyroidism, HTN and HLD here for an acute visit for:  Weight loss: -started with intentional work on exercise and diet a few years ago when retired, but feels like unintentional the last 6 months because was not exercising anymore -started back to work in the fall after retirement and had a stressful work schedule and stress related to family issues - borther with pancreatic cancer. Has been too busy to cook so doesn't eat as well. Reports she come last, has house work to do and had a ton of work with school until recently and this always come first. Does not eat 3 meals per day and did not eat well during the last 2 semesters as was too busy. Some depression after ending the semester - feels she did not do a good enough job. Stresses about the news - so many negative events - upsetting to her. -weight last visit 07/2014 was 172, wt today is 159 -reports: weight loss, changes in bowels - looser stools for last 3 years with metformin, anxiety at times and tremor of hand if is anxious -denies: melena, hematochezia, SOB, cough, fevers, malaise, CP, urinary or bleeding problems, nausea, vomiting, memory problems, weakness, night sweats, decreased appetite, nausea, SI, thoughts of self harm  She sees a specialist for most of her care needs. Reports last hgba1c 6.7 in march with endo . Last eye exam 10/2014 per her report without diabetic findings. UTD on mammo and colon ca screening.  ROS: See pertinent positives and negatives per HPI.  Past Medical History  Diagnosis Date  . Arthritis   . Cancer   . Chicken pox   . GERD (gastroesophageal reflux disease)   . Hypertension   . High cholesterol   . Kidney disease   . UTI (urinary tract infection)   . Chest pain summer 2012  . Diverticulosis     difficulty with colonoscopy 2005  . 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 Elyse Hsu  10/08/2012  . Hypothyroid, s/p radioactive iodine tx for graves 2001 08/24/2012    Past Surgical History  Procedure Laterality Date  . Abdominal hysterectomy  1989    Family History  Problem Relation Age of Onset  . Colon cancer Other   . Lung cancer      grandparent  . Stroke    . Cancer Mother     liver  . Stroke Father 80  . Diabetes Father   . Hypertension Father     History   Social History  . Marital Status: Married    Spouse Name: N/A  . Number of Children: N/A  . Years of Education: N/A   Social History Main Topics  . Smoking status: Never Smoker   . Smokeless tobacco: Not on file  . Alcohol Use: No  . Drug Use: No  . Sexual Activity: Yes   Other Topics Concern  . 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  AMLODIPINE BESYLATE PO, Take 10 mg by mouth daily. , Disp: , Rfl:  .  aspirin 81 MG tablet, Take 81 mg by mouth daily., Disp: , Rfl:  .  ATENOLOL PO, Take 50 mg by mouth 2 (two) times daily. , Disp: , Rfl:  .  ATORVASTATIN CALCIUM PO, Take 40 mg by mouth at bedtime and may repeat dose one time if needed. , Disp: , Rfl:  .  b complex vitamins tablet, Take 1 tablet by mouth daily., Disp: , Rfl:  .  Biotin (BIOTIN 5000) 5 MG CAPS, Take by mouth daily., Disp: , Rfl:  .  Blood Glucose Monitoring Suppl (ACCU-CHEK AVIVA) device, by Other route. Use as instructed-Accu-chek Aviva Expert, Disp: , Rfl:  .  Calcium Carbonate-Vitamin D (CALTRATE 600+D) 600-400 MG-UNIT per tablet, Take 1 tablet by mouth daily., Disp: , Rfl:  .  Cholecalciferol (VITAMIN D3) 2000 UNITS TABS, Take by mouth., Disp: ,  Rfl:  .  hydrochlorothiazide (HYDRODIURIL) 25 MG tablet, Take 25 mg by mouth daily. , Disp: , Rfl:  .  Insulin Aspart (NOVOLOG [place]), Inject 8 Units into the skin as directed. And sliding scale based on readings., Disp: , Rfl:  .  insulin glargine (LANTUS) 100 UNIT/ML injection, Inject 35 Units into the skin at bedtime. , Disp: , Rfl:  .  levothyroxine (SYNTHROID, LEVOTHROID) 75 MCG tablet, Take 75 mcg by mouth daily., Disp: , Rfl:  .  METFORMIN HCL PO, Take 500 mg by mouth 2 (two) times daily. , Disp: , Rfl:  .  Multiple Vitamins-Minerals (CENTRUM SILVER PO), Take by mouth daily., Disp: , Rfl:  .  mupirocin cream (BACTROBAN) 2 %, Apply 1 application topically 3 (three) times daily., Disp: 15 g, Rfl: 0 .  Pioglitazone HCl (ACTOS PO), Take 45 mg by mouth daily. , Disp: , Rfl:  .  Valsartan (DIOVAN PO), Take 320 mg by mouth daily. , Disp: , Rfl:   EXAM:  Filed Vitals:   04/02/15 0921  BP: 122/60  Pulse: 67  Temp: 98.3 F (36.8 C)    Body mass index is 25.31 kg/(m^2).  GENERAL: vitals reviewed and listed above, alert, oriented, appears well hydrated and in no acute distress  HEENT: atraumatic, conjunttiva clear, no obvious abnormalities on inspection of external nose and ears  NECK: no obvious masses on inspection, no LAD  LYMPH: no cervical or axillary or supraclavicular LAD  LUNGS: clear to auscultation bilaterally, no wheezes, rales or rhonchi, good air movement  CV: HRRR, no peripheral edema  ABD: BS+, soft, NTTP  MS: moves all extremities without noticeable abnormality  PSYCH: pleasant and cooperative, no obvious depression or anxiety  ASSESSMENT AND PLAN:  Discussed the following assessment and plan:  Loss of weight - Plan: CBC with Differential, CMP, TSH  Stress  Type 2 diabetes mellitus with other diabetic kidney complication - Plan: Hemoglobin A1c  CKD (chronic kidney disease) stage 3, GFR 30-59 ml/min  Essential hypertension  Hypothyroidism, unspecified  hypothyroidism type  Hyperlipemia  -we discussed possible serious and likely etiologies, workup and treatment, treatment risks and return precautions, normal exam, has been under a lot of stress with anxiety, OC tendencies and mild depression which may be contribting -after this discussion, Alpa opted for: -basic labs, advised 3 regular meals and close follow up in 1 month, if weight loss persists advised GI eval -discussed options for prepared foods, meals on wheels, etc -advised CBT for anxiety and stress -Patient advised to return  or notify a doctor immediately if symptoms worsen or persist or new concerns arise.  Patient Instructions  BEFORE YOU LEAVE: -labs -follow up appointment in 1 month - if weight not improving we will have you see the gastroenterologist  Please eat 3 regular normal meals per day  Please call to set up counseling  -We have ordered labs or studies at this visit. It can take up to 1-2 weeks for results and processing. We will contact you with instructions IF your results are abnormal. Normal results will be released to your [facility]. If you have not heard from [place] or can not find your results in [facility] in 2 weeks please contact our office.            Colin Benton R.

## 2015-04-02 NOTE — Patient Instructions (Signed)
BEFORE YOU LEAVE: -labs -follow up appointment in 1 month - if weight not improving we will have you see the gastroenterologist  Please eat 3 regular normal meals per day  Please call to set up counseling  -We have ordered labs or studies at this visit. It can take up to 1-2 weeks for results and processing. We will contact you with instructions IF your results are abnormal. Normal results will be released to your [facility]. If you have not heard from [place] or can not find your results in [facility] in 2 weeks please contact our office.

## 2015-04-02 NOTE — Progress Notes (Signed)
Pre visit review using our clinic review tool, if applicable. No additional management support is needed unless otherwise documented below in the visit note. 

## 2015-05-01 ENCOUNTER — Encounter: Payer: Self-pay | Admitting: Family Medicine

## 2015-05-01 ENCOUNTER — Ambulatory Visit (INDEPENDENT_AMBULATORY_CARE_PROVIDER_SITE_OTHER): Payer: Medicare Other | Admitting: Family Medicine

## 2015-05-01 VITALS — BP 108/70 | HR 70 | Temp 98.6°F | Ht 66.5 in | Wt 162.2 lb

## 2015-05-01 DIAGNOSIS — F439 Reaction to severe stress, unspecified: Secondary | ICD-10-CM

## 2015-05-01 DIAGNOSIS — R634 Abnormal weight loss: Secondary | ICD-10-CM

## 2015-05-01 DIAGNOSIS — E1129 Type 2 diabetes mellitus with other diabetic kidney complication: Secondary | ICD-10-CM | POA: Diagnosis not present

## 2015-05-01 DIAGNOSIS — Z658 Other specified problems related to psychosocial circumstances: Secondary | ICD-10-CM

## 2015-05-01 MED ORDER — MUPIROCIN CALCIUM 2 % EX CREA: 1.0000 "application " | TOPICAL_CREAM | Freq: Three times a day (TID) | CUTANEOUS | Status: DC

## 2015-05-01 NOTE — Progress Notes (Signed)
HPI:  Pamela Patton is here for follow up:  Weight loss: -~10-12 lb wht loss over last year, more over last few years, but started out as intentional with work on diet and exercise per her report -suspected due to stress, increased work load, poor eating habits -today reports has been eating regular meals and has gained 3 lbs and feel much better, good energy and reports feels great - less stress -labs ok - sees endocrine for her diabetes -reports mild anemia her whole life, improved -did one month trail of regular meals as she had not been eating -reports: feels well o/w -denies: fevers, night sweats, nausea, vomiting, diarrhea, change in bowels, melena, hematochezia, dysuria, abd pain, cough, SOB, CP   ROS: See pertinent positives and negatives per HPI.  Past Medical History  Diagnosis Date  . Arthritis   . Cancer   . Chicken pox   . GERD (gastroesophageal reflux disease)   . Hypertension   . High cholesterol   . Kidney disease   . UTI (urinary tract infection)   . Chest pain summer 2012  . Diverticulosis     difficulty with colonoscopy 2005  . 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 Elyse Hsu 10/08/2012  . Hypothyroid, s/p radioactive iodine tx for graves 2001 08/24/2012    Past Surgical History  Procedure Laterality Date  . Abdominal hysterectomy  1989    Family History  Problem Relation Age of Onset  . Colon cancer Other   . Lung cancer      grandparent  . Stroke    . Cancer Mother     liver  . Stroke Father 23  . Diabetes Father   . Hypertension Father     History   Social History  . Marital Status: Married    Spouse Name: N/A  . Number of Children: N/A  . Years of Education: N/A   Social History Main Topics  . Smoking status: Never Smoker   . Smokeless tobacco: Not on file  . Alcohol Use: No  . Drug Use: No  . Sexual Activity: Yes   Other Topics Concern  . 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  AMLODIPINE BESYLATE PO, Take 10 mg by mouth daily. , Disp: , Rfl:  .  aspirin 81 MG tablet, Take 81 mg by mouth daily., Disp: , Rfl:  .  ATENOLOL PO, Take 50 mg by mouth 2 (two) times daily. , Disp: , Rfl:  .  ATORVASTATIN CALCIUM PO, Take 40 mg by mouth at bedtime and may repeat dose one time if needed. , Disp: , Rfl:  .  b complex vitamins tablet, Take 1 tablet by mouth daily., Disp: , Rfl:  .  Biotin (BIOTIN 5000) 5 MG CAPS, Take by mouth daily., Disp: , Rfl:  .  Blood Glucose Monitoring Suppl (ACCU-CHEK AVIVA) device, by Other route. Use as instructed-Accu-chek Aviva Expert, Disp: , Rfl:  .  Calcium Carbonate-Vitamin D (CALTRATE 600+D) 600-400 MG-UNIT per  tablet, Take 1 tablet by mouth daily., Disp: , Rfl:  .  Cholecalciferol (VITAMIN D3) 2000 UNITS TABS, Take by mouth., Disp: , Rfl:  .  hydrochlorothiazide (HYDRODIURIL) 25 MG tablet, Take 25 mg by mouth daily. , Disp: , Rfl:  .  Insulin Aspart (NOVOLOG [place]), Inject 8 Units into the skin as directed. And sliding scale based on readings., Disp: , Rfl:  .  insulin glargine (LANTUS) 100 UNIT/ML injection, Inject 35 Units into the skin at bedtime. , Disp: , Rfl:  .  levothyroxine (SYNTHROID, LEVOTHROID) 75 MCG tablet, Take 75 mcg by mouth daily., Disp: , Rfl:  .  METFORMIN HCL PO, Take 500 mg by mouth 2 (two) times daily. , Disp: , Rfl:  .  Multiple Vitamins-Minerals (CENTRUM SILVER PO), Take by mouth daily., Disp: , Rfl:  .  mupirocin cream (BACTROBAN) 2 %, Apply 1 application topically 3 (three) times daily., Disp: 15 g, Rfl: 0 .  Pioglitazone HCl (ACTOS PO),  Take 45 mg by mouth daily. , Disp: , Rfl:  .  Valsartan (DIOVAN PO), Take 320 mg by mouth daily. , Disp: , Rfl:   EXAM:  Filed Vitals:   05/01/15 0849  BP: 108/70  Pulse: 70  Temp: 98.6 F (37 C)    Body mass index is 25.79 kg/(m^2).  GENERAL: vitals reviewed and listed above, alert, oriented, appears well hydrated and in no acute distress  HEENT: atraumatic, conjunttiva clear, no obvious abnormalities on inspection of external nose and ears  NECK: no obvious masses on inspection  LUNGS: clear to auscultation bilaterally, no wheezes, rales or rhonchi, good air movement  CV: HRRR, no peripheral edema  MS: moves all extremities without noticeable abnormality  PSYCH: pleasant and cooperative, no obvious depression or anxiety  ASSESSMENT AND PLAN:  Discussed the following assessment and plan:  Loss of weight  Stress  Type 2 diabetes mellitus with other diabetic kidney complication  -suspect stress as cause for her prior weight loss -with regular meals and decreased stress she has gained 3 lbs in 1 month and reports she feels great -she has had anemia for a long time, improved -discussed all this and she has opted to continue working on stress management and a healthy diet and monitor weight -Patient advised to return or notify a doctor immediately if symptoms worsen or persist or new concerns arise.  Patient Instructions  BEFORE YOU LEAVE: -schedule follow up in 3-4 months  Continue to work on stress management  Continue to ensure you are eating regular healthy meals - do not skip meals     Quiana Cobaugh R.

## 2015-05-01 NOTE — Patient Instructions (Signed)
BEFORE YOU LEAVE: -schedule follow up in 3-4 months  Continue to work on stress management  Continue to ensure you are eating regular healthy meals - do not skip meals

## 2015-05-01 NOTE — Progress Notes (Signed)
Pre visit review using our clinic review tool, if applicable. No additional management support is needed unless otherwise documented below in the visit note. 

## 2015-05-01 NOTE — Addendum Note (Signed)
Addended by: Lucretia Kern on: 05/01/2015 09:21 AM   Modules accepted: Orders

## 2015-05-12 ENCOUNTER — Encounter: Payer: Self-pay | Admitting: *Deleted

## 2015-07-31 ENCOUNTER — Ambulatory Visit (INDEPENDENT_AMBULATORY_CARE_PROVIDER_SITE_OTHER): Payer: Medicare Other | Admitting: Family Medicine

## 2015-07-31 ENCOUNTER — Encounter: Payer: Self-pay | Admitting: Family Medicine

## 2015-07-31 VITALS — BP 124/84 | HR 55 | Temp 98.5°F | Ht 66.5 in | Wt 163.4 lb

## 2015-07-31 DIAGNOSIS — E039 Hypothyroidism, unspecified: Secondary | ICD-10-CM | POA: Diagnosis not present

## 2015-07-31 DIAGNOSIS — Z794 Long term (current) use of insulin: Secondary | ICD-10-CM

## 2015-07-31 DIAGNOSIS — N183 Chronic kidney disease, stage 3 unspecified: Secondary | ICD-10-CM

## 2015-07-31 DIAGNOSIS — E1129 Type 2 diabetes mellitus with other diabetic kidney complication: Secondary | ICD-10-CM

## 2015-07-31 DIAGNOSIS — I10 Essential (primary) hypertension: Secondary | ICD-10-CM | POA: Diagnosis not present

## 2015-07-31 DIAGNOSIS — E785 Hyperlipidemia, unspecified: Secondary | ICD-10-CM | POA: Diagnosis not present

## 2015-07-31 NOTE — Progress Notes (Signed)
HPI:  Follow up:  DM/Hypothyroidism:: -sees endocrine -meds: synthroid, lantus 35, aspart 8 with meals, metformin, actos, arb, asa -eye exam: done -foot exam: done 3 months ago with endo per her report -denies: hypoglycemia, vision changes, wounds -labs reviewed and scanned in  Anxiety and stress: -improved -did cause some weight loss due to poor eating during a stressfull semester in 2016 - now continues to gain slowly  HTN/HLD: -sees Dr. Gwenlyn Found for this -meds: anlodipine 10, valsartan 320. hctz 25, atorvastatin 40 -denies: CP, SOB, DOE -labs reviewed and scanned in  ROS: See pertinent positives and negatives per HPI.  Past Medical History  Diagnosis Date  . Arthritis   . Cancer ([place])   . Chicken pox   . GERD (gastroesophageal reflux disease)   . Hypertension   . High cholesterol   . Kidney disease   . UTI (urinary tract infection)   . Chest pain summer 2012  . Diverticulosis     difficulty with colonoscopy 2005  . 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 Elyse Hsu 10/08/2012  . Hypothyroid, s/p radioactive iodine tx for graves 2001 08/24/2012    Past Surgical History  Procedure Laterality Date  . Abdominal hysterectomy  1989    Family History  Problem Relation Age of Onset  . Colon cancer Other   . Lung cancer      grandparent  . Stroke    . Cancer Mother     liver  . Stroke Father 61  . Diabetes Father   . Hypertension Father   . Diabetes Maternal Grandmother   . Diabetes Sister   . Cancer Sister     Social History   Social History  . Marital Status: Married    Spouse Name: N/A  . Number of Children: N/A  . Years of Education: N/A   Social History Main Topics  . Smoking status: Never Smoker   . Smokeless tobacco: None  . Alcohol Use: No  . Drug Use: No  . Sexual Activity: Yes   Other Topics Concern  . 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  AMLODIPINE BESYLATE PO, Take 10 mg by mouth daily. , Disp: , Rfl:  .  aspirin 81 MG tablet, Take 81 mg by mouth daily., Disp: , Rfl:  .  ATENOLOL PO, Take 50 mg by mouth 2 (two) times daily. , Disp: , Rfl:  .  ATORVASTATIN CALCIUM PO, Take 40 mg by mouth at bedtime and may repeat dose one time if needed. , Disp: , Rfl:  .  b complex vitamins tablet, Take 1 tablet by mouth daily., Disp: , Rfl:  .  Biotin (BIOTIN 5000) 5 MG CAPS, Take by mouth daily., Disp: , Rfl:  .  Blood Glucose Monitoring Suppl (ACCU-CHEK AVIVA) device, by Other route. Use as instructed-Accu-chek Aviva Expert, Disp: , Rfl:  .  Calcium Carbonate-Vitamin D (CALTRATE 600+D) 600-400 MG-UNIT per tablet, Take 1 tablet by mouth daily., Disp: , Rfl:  .  Cholecalciferol (VITAMIN D3) 2000 UNITS TABS, Take by mouth., Disp: , Rfl:  .  Coenzyme Q10 (COQ10 PO), Take by mouth., Disp: , Rfl:  .  hydrochlorothiazide (HYDRODIURIL) 25 MG tablet, Take 25 mg by mouth daily. , Disp: , Rfl:  .  Insulin Aspart (NOVOLOG [place]), Inject 8 Units into the skin as directed. And sliding scale based on readings., Disp: , Rfl:  .  insulin glargine (LANTUS) 100 UNIT/ML injection, Inject 35 Units into the skin at bedtime. , Disp: , Rfl:  .  levothyroxine (SYNTHROID, LEVOTHROID) 75 MCG tablet, Take 75 mcg by mouth daily., Disp: , Rfl:  .  METFORMIN HCL PO, Take 500 mg by mouth 2 (two) times daily. , Disp: , Rfl:  .  Multiple Vitamins-Minerals (CENTRUM SILVER PO), Take by mouth daily., Disp: , Rfl:  .  mupirocin cream (BACTROBAN) 2 %, Apply 1 application topically 3 (three) times daily., Disp: 15 g, Rfl: 0 .  Pioglitazone HCl (ACTOS  PO), Take 45 mg by mouth daily. , Disp: , Rfl:  .  Valsartan (DIOVAN PO), Take 320 mg by mouth daily. , Disp: , Rfl:   EXAM:  Filed Vitals:   07/31/15 0816  BP: 124/84  Pulse: 55  Temp: 98.5 F (36.9 C)    Body mass index is 25.98 kg/(m^2).  GENERAL: vitals reviewed and listed above, alert, oriented, appears well hydrated and in no acute distress  HEENT: atraumatic, conjunttiva clear, no obvious abnormalities on inspection of external nose and ears  NECK: no obvious masses on inspection  LUNGS: clear to auscultation bilaterally, no wheezes, rales or rhonchi, good air movement  CV: HRRR, SEM, no peripheral edema  MS: moves all extremities without noticeable abnormality  PSYCH: pleasant and cooperative, no obvious depression or anxiety  ASSESSMENT AND PLAN:  Discussed the following assessment and plan:  Type 2 diabetes mellitus with other diabetic kidney complication, with long-term current use of insulin (HCC)  Hypothyroidism, unspecified hypothyroidism type  Hyperlipemia  Essential hypertension  CKD (chronic kidney disease) stage 3, GFR 30-59 ml/min  -Patient advised to return or notify a doctor immediately if symptoms worsen or persist or new concerns arise.  Patient Instructions  BEFORE YOU LEAVE: -schedule follow up in 6 month  Weight is 1 pound up from last visit.  We recommend the following healthy lifestyle measures: - eat a healthy whole foods diet consisting of regular small meals composed of vegetables, fruits, beans, nuts, seeds, healthy meats such as white chicken and fish and whole grains.  - avoid sweets, white starchy foods, fried foods, fast food, processed foods, sodas, red meet and other fattening foods.  - get a least 150-300 minutes of aerobic exercise per week.       Colin Benton R.

## 2015-07-31 NOTE — Patient Instructions (Signed)
BEFORE YOU LEAVE: -schedule follow up in 6 month  Weight is 1 pound up from last visit.  We recommend the following healthy lifestyle measures: - eat a healthy whole foods diet consisting of regular small meals composed of vegetables, fruits, beans, nuts, seeds, healthy meats such as white chicken and fish and whole grains.  - avoid sweets, white starchy foods, fried foods, fast food, processed foods, sodas, red meet and other fattening foods.  - get a least 150-300 minutes of aerobic exercise per week.

## 2015-07-31 NOTE — Progress Notes (Signed)
Pre visit review using our clinic review tool, if applicable. No additional management support is needed unless otherwise documented below in the visit note. 

## 2015-09-09 ENCOUNTER — Other Ambulatory Visit: Payer: Self-pay

## 2015-09-09 DIAGNOSIS — Z1231 Encounter for screening mammogram for malignant neoplasm of breast: Secondary | ICD-10-CM

## 2015-09-21 ENCOUNTER — Ambulatory Visit (INDEPENDENT_AMBULATORY_CARE_PROVIDER_SITE_OTHER): Payer: Medicare Other | Admitting: Cardiovascular Disease

## 2015-09-21 ENCOUNTER — Encounter: Payer: Self-pay | Admitting: Cardiovascular Disease

## 2015-09-21 VITALS — BP 118/62 | HR 59 | Ht 66.0 in | Wt 160.4 lb

## 2015-09-21 DIAGNOSIS — I10 Essential (primary) hypertension: Secondary | ICD-10-CM

## 2015-09-21 DIAGNOSIS — E785 Hyperlipidemia, unspecified: Secondary | ICD-10-CM | POA: Diagnosis not present

## 2015-09-21 NOTE — Assessment & Plan Note (Signed)
History of hypertension blood pressure measured at 118/62. She is on amlodipine, atenolol , hydrochlorothiazide and valsartan. Continue current meds at current dosing

## 2015-09-21 NOTE — Assessment & Plan Note (Signed)
History of hyperlipidemia on atorvastatin followed by her PCP 

## 2015-09-21 NOTE — Progress Notes (Signed)
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 I last saw her in the office 07/08/13. Her risk factor profile is remarkable for diabetes, hypertension and hyperlipidemia. She had a Myoview performed 2 years ago which showed breast attenuation artifact. She really denies chest pain. Dr. Elyse Hsu follows her lipid profile as an outpatient. She denies chest pain or shortness of breath.  Current Outpatient Prescriptions  Medication Sig Dispense Refill  . AMLODIPINE BESYLATE PO Take 10 mg by mouth daily.     Marland Kitchen aspirin 81 MG tablet Take 81 mg by mouth daily.    . ATENOLOL PO Take 50 mg by mouth 2 (two) times daily.     Marland Kitchen atorvastatin (LIPITOR) 40 MG tablet Take 40 mg by mouth daily.    Marland Kitchen b complex vitamins tablet Take 1 tablet by mouth daily.    . Biotin (BIOTIN 5000) 5 MG CAPS Take 5 mg by mouth daily.     . Blood Glucose Monitoring Suppl (ACCU-CHEK AVIVA) device by Other route. Use as instructed-Accu-chek Environmental health practitioner    . Calcium Carbonate-Vitamin D (CALTRATE 600+D) 600-400 MG-UNIT per tablet Take 1 tablet by mouth daily.    . Cholecalciferol (VITAMIN D3) 2000 UNITS TABS Take 1 tablet by mouth daily.     . Coenzyme Q10 (COQ10 PO) Take 1 tablet by mouth daily.     . hydrochlorothiazide (HYDRODIURIL) 25 MG tablet Take 25 mg by mouth daily.     . Insulin Aspart (NOVOLOG [place]) Inject 8 Units into the skin as directed. And sliding scale based on readings.    . insulin glargine (LANTUS) 100 UNIT/ML injection Inject 35 Units into the skin at bedtime.     Marland Kitchen levothyroxine (SYNTHROID, LEVOTHROID) 75 MCG tablet Take 75 mcg by mouth daily.    Marland Kitchen METFORMIN HCL PO Take 500 mg by mouth 2 (two) times daily.     .  Multiple Vitamins-Minerals (CENTRUM SILVER PO) Take 1 tablet by mouth daily.     . Pioglitazone HCl (ACTOS PO) Take 45 mg by mouth daily.     . Valsartan (DIOVAN PO) Take 320 mg by mouth daily.      No current facility-administered medications for this visit.    No Known Allergies  Social History   Social History  . Marital Status: Married    Spouse Name: N/A  . Number of Children: N/A  . Years of Education: N/A   Occupational History  . Not on file.   Social History Main Topics  . Smoking status: Never Smoker   . Smokeless tobacco: Never Used  . Alcohol Use: No  . Drug Use: No  . Sexual Activity: Yes   Other Topics Concern  . 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  Cardiovascular: negative for chest pain, dyspnea on exertion, edema, orthopnea, palpitations, paroxysmal nocturnal dyspnea or shortness of breath Dermatological: negative for rash Respiratory: negative for cough or wheezing Urologic: negative for hematuria Abdominal: negative for nausea, vomiting, diarrhea, bright red blood per rectum, melena, or hematemesis Neurologic: negative for visual changes, syncope, or dizziness All other systems reviewed and are otherwise negative except as noted above.    Blood pressure 118/62, pulse 59, height 5\' 6"  (1.676 m), weight 160 lb 6.4 oz (72.757 kg).  General appearance: alert and no distress Neck: no adenopathy, no JVD, supple, symmetrical, trachea midline, thyroid not enlarged, symmetric, no tenderness/mass/nodules and soft left  carotid bruit Lungs: clear to auscultation bilaterally Heart: soft flow tract murmur Extremities: extremities normal, atraumatic, no cyanosis or edema  EKG sinus bradycardia 59 with nonspecific ST and T-wave changes. I personally reviewed this EKG  ASSESSMENT AND PLAN:   Hypertension History of hypertension blood pressure measured at 118/62. She is on amlodipine, atenolol , hydrochlorothiazide and valsartan. Continue current meds at current dosing  Hyperlipemia History of hyperlipidemia on atorvastatin followed by her PCP      Lorretta Harp MD [facility], [facility] 09/21/2015 4:51 PM

## 2015-09-21 NOTE — Patient Instructions (Signed)
Medication Instructions:  Your physician recommends that you continue on your current medications as directed. Please refer to the Current Medication list given to you today.  Labwork: NONE  Testing/Procedures: Your physician has requested that you have a carotid duplex. This test is an ultrasound of the carotid arteries in your neck. It looks at blood flow through these arteries that supply the brain with blood. Allow one hour for this exam. There are no restrictions or special instructions.  Follow-Up: Your physician wants you to follow-up in: 1 year with Dr. Gwenlyn Found. You will receive a reminder letter in the mail two months in advance. If you don't receive a letter, please call our office to schedule the follow-up appointment.   If you need a refill on your cardiac medications before your next appointment, please call your pharmacy.

## 2015-10-19 ENCOUNTER — Ambulatory Visit
Admission: RE | Admit: 2015-10-19 | Discharge: 2015-10-19 | Disposition: A | Discharge status: Home / Self Care | Payer: Medicare Other | Source: Ambulatory Visit

## 2015-10-19 DIAGNOSIS — Z1231 Encounter for screening mammogram for malignant neoplasm of breast: Secondary | ICD-10-CM

## 2015-10-27 ENCOUNTER — Ambulatory Visit (HOSPITAL_COMMUNITY)
Admission: RE | Admit: 2015-10-27 | Discharge: 2015-10-27 | Disposition: A | Discharge status: Home / Self Care | Payer: Medicare Other | Source: Ambulatory Visit | Attending: Cardiology | Admitting: Cardiology

## 2015-10-27 DIAGNOSIS — R0989 Other specified symptoms and signs involving the circulatory and respiratory systems: Secondary | ICD-10-CM | POA: Diagnosis not present

## 2015-10-27 DIAGNOSIS — E785 Hyperlipidemia, unspecified: Secondary | ICD-10-CM | POA: Diagnosis not present

## 2015-10-27 DIAGNOSIS — I10 Essential (primary) hypertension: Secondary | ICD-10-CM

## 2015-10-27 DIAGNOSIS — I6523 Occlusion and stenosis of bilateral carotid arteries: Secondary | ICD-10-CM

## 2015-10-27 DIAGNOSIS — E119 Type 2 diabetes mellitus without complications: Secondary | ICD-10-CM | POA: Insufficient documentation

## 2015-10-27 DIAGNOSIS — E7800 Pure hypercholesterolemia, unspecified: Secondary | ICD-10-CM | POA: Diagnosis not present

## 2015-11-04 LAB — HEMOGLOBIN A1C: Hemoglobin A1C: 7.6

## 2015-11-27 ENCOUNTER — Encounter: Payer: Self-pay | Admitting: Family Medicine

## 2016-01-29 ENCOUNTER — Encounter: Payer: Self-pay | Admitting: Family Medicine

## 2016-01-29 ENCOUNTER — Ambulatory Visit (INDEPENDENT_AMBULATORY_CARE_PROVIDER_SITE_OTHER): Payer: Medicare Other | Admitting: Family Medicine

## 2016-01-29 VITALS — BP 118/59 | HR 59 | Temp 98.3°F | Ht 66.0 in | Wt 158.1 lb

## 2016-01-29 DIAGNOSIS — N183 Chronic kidney disease, stage 3 unspecified: Secondary | ICD-10-CM

## 2016-01-29 DIAGNOSIS — E1129 Type 2 diabetes mellitus with other diabetic kidney complication: Secondary | ICD-10-CM

## 2016-01-29 DIAGNOSIS — E039 Hypothyroidism, unspecified: Secondary | ICD-10-CM | POA: Diagnosis not present

## 2016-01-29 DIAGNOSIS — E785 Hyperlipidemia, unspecified: Secondary | ICD-10-CM

## 2016-01-29 DIAGNOSIS — I10 Essential (primary) hypertension: Secondary | ICD-10-CM | POA: Diagnosis not present

## 2016-01-29 DIAGNOSIS — Z794 Long term (current) use of insulin: Secondary | ICD-10-CM

## 2016-01-29 NOTE — Progress Notes (Signed)
HPI:  DM/Hypothyroidism:: -sees endocrine, had hgba1c with insurance nurse recently 7.2 -seeing endo tomorrow for labs and follow up -meds: synthroid, lantus, aspar with meals, metformin, actos, arb, asa -eye exam: done - [facility] -foot exam: done  -denies: hypoglycemia, vision changes, wounds   Anxiety and stress: -improved -did cause some weight loss due to poor eating during a stressfull semester in 2016 -now exercising more and eating healthy, wt  Down 4 lbs in 6 months -feels good other then new stresses her out  HTN/HLD: -sees Dr. Gwenlyn Found for this -meds: anlodipine 10, valsartan 320. hctz 25, atorvastatin 40 -denies: CP, SOB, DOE -she reports she had carotid artery scan and was told this was good  ROS: See pertinent positives and negatives per HPI.  Past Medical History  Diagnosis Date  . Arthritis   . Cancer ([place])   . Chicken pox   . GERD (gastroesophageal reflux disease)   . Hypertension   . High cholesterol   . Kidney disease   . UTI (urinary tract infection)   . Chest pain summer 2012  . Diverticulosis     difficulty with colonoscopy 2005  . 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 Elyse Hsu 10/08/2012  . Hypothyroid, s/p radioactive iodine tx for graves 2001 08/24/2012    Past Surgical History  Procedure Laterality Date  . Abdominal hysterectomy  1989    Family History  Problem Relation Age of Onset  . Colon cancer Other   . Lung cancer      grandparent  . Stroke    . Cancer Mother     liver  . Stroke Father 36  . Diabetes Father   . Hypertension Father   . Diabetes Maternal Grandmother   . Pancreatic cancer Brother   . Diabetes Sister   . Cancer Sister   . Diabetes Sister     Social History   Social History  . Marital Status: Married    Spouse Name: N/A  . Number of Children: N/A  . Years of Education: N/A   Social History Main Topics  . Smoking status: Never Smoker   . Smokeless  tobacco: Never Used  . Alcohol Use: No  . Drug Use: No  . Sexual Activity: Yes   Other Topics Concern  . 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  AMLODIPINE BESYLATE PO, Take 10 mg by mouth daily. , Disp: , Rfl:  .  aspirin 81 MG tablet, Take 81 mg by mouth daily., Disp: , Rfl:  .  ATENOLOL PO, Take 50 mg by mouth 2 (two) times daily. , Disp: , Rfl:  .  atorvastatin (LIPITOR) 40 MG tablet, Take 40 mg by mouth daily., Disp: , Rfl:  .  b complex vitamins tablet, Take 1 tablet by mouth daily., Disp: , Rfl:  .  Biotin (BIOTIN 5000) 5 MG CAPS, Take 5 mg by mouth daily. , Disp: , Rfl:  .  Blood Glucose Monitoring Suppl (ACCU-CHEK AVIVA) device, by  Other route. Use as instructed-Accu-chek [employer] Expert, Disp: , Rfl:  .  Calcium Carbonate-Vitamin D (CALTRATE 600+D) 600-400 MG-UNIT per tablet, Take 1 tablet by mouth daily., Disp: , Rfl:  .  Cholecalciferol (VITAMIN D3) 2000 UNITS TABS, Take 1 tablet by mouth daily. , Disp: , Rfl:  .  Coenzyme Q10 (COQ10 PO), Take 1 tablet by mouth daily. , Disp: , Rfl:  .  hydrochlorothiazide (HYDRODIURIL) 25 MG tablet, Take 25 mg by mouth daily. , Disp: , Rfl:  .  Insulin Aspart (NOVOLOG [place]), Inject 8 Units into the skin as directed. And sliding scale based on readings., Disp: , Rfl:  .  insulin glargine (LANTUS) 100 UNIT/ML injection, Inject 35 Units into the skin at bedtime. , Disp: , Rfl:  .  levothyroxine (SYNTHROID, LEVOTHROID) 75 MCG tablet, Take 75 mcg by mouth daily., Disp: , Rfl:  .  METFORMIN HCL PO, Take 500 mg by mouth 2 (two) times daily. , Disp: , Rfl:  .  Multiple  Vitamins-Minerals (CENTRUM SILVER PO), Take 1 tablet by mouth daily. , Disp: , Rfl:  .  Pioglitazone HCl (ACTOS PO), Take 45 mg by mouth daily. , Disp: , Rfl:  .  Valsartan (DIOVAN PO), Take 320 mg by mouth daily. , Disp: , Rfl:   EXAM:  Filed Vitals:   01/29/16 0818  BP: 118/59  Pulse: 59  Temp: 98.3 F (36.8 C)    Body mass index is 25.53 kg/(m^2).  GENERAL: vitals reviewed and listed above, alert, oriented, appears well hydrated and in no acute distress  HEENT: atraumatic, conjunttiva clear, no obvious abnormalities on inspection of external nose and ears  NECK: no obvious masses on inspection  LUNGS: clear to auscultation bilaterally, no wheezes, rales or rhonchi, good air movement  CV: HRRR, no peripheral edema  MS: moves all extremities without noticeable abnormality  PSYCH: pleasant and cooperative, no obvious depression or anxiety  ASSESSMENT AND PLAN:  Discussed the following assessment and plan:  Type 2 diabetes mellitus with other diabetic kidney complication, with long-term current use of insulin (HCC)  CKD (chronic kidney disease) stage 3, GFR 30-59 ml/min  Essential hypertension  Hypothyroidism, unspecified hypothyroidism type  Hyperlipemia  -Patient advised to return or notify a doctor immediately if symptoms worsen or persist or new concerns arise.  Patient Instructions  BEFORE LEAVE: -Pamela Patton, please obtain and abstract eye exam and copy home nurse visit Hgba1c and abstract to chart -schedule medicare wellness visit in 3-4 months; come fasting if possible and will do lab  We recommend the following healthy lifestyle measures: - eat a healthy whole foods diet consisting of regular small meals composed of vegetables, fruits, beans, nuts, seeds, healthy meats such as white chicken and fish and whole grains.  - avoid sweets, white starchy foods, fried foods, fast food, processed foods, sodas, red meet and other fattening foods.  - get a least 150-300  minutes of aerobic exercise per week.       Colin Benton R.

## 2016-01-29 NOTE — Progress Notes (Signed)
Pre visit review using our clinic review tool, if applicable. No additional management support is needed unless otherwise documented below in the visit note. 

## 2016-01-29 NOTE — Patient Instructions (Addendum)
BEFORE LEAVE: -Wendie Simmer, please obtain and abstract eye exam and copy home nurse visit Hgba1c and abstract to chart -schedule medicare wellness visit in 3-4 months; come fasting if possible and will do lab  We recommend the following healthy lifestyle measures: - eat a healthy whole foods diet consisting of regular small meals composed of vegetables, fruits, beans, nuts, seeds, healthy meats such as white chicken and fish and whole grains.  - avoid sweets, white starchy foods, fried foods, fast food, processed foods, sodas, red meet and other fattening foods.  - get a least 150-300 minutes of aerobic exercise per week.

## 2016-02-01 LAB — LIPID PANEL
Cholesterol: 166 mg/dL (ref 0–200)
HDL: 85 mg/dL — AB (ref 35–70)
LDL CALC: 69 mg/dL
Triglycerides: 61 mg/dL (ref 40–160)

## 2016-02-01 LAB — HEMOGLOBIN A1C: HEMOGLOBIN A1C: 7.2

## 2016-02-09 ENCOUNTER — Encounter: Payer: Self-pay | Admitting: Family Medicine

## 2016-03-12 ENCOUNTER — Ambulatory Visit (HOSPITAL_COMMUNITY)
Admission: EM | Admit: 2016-03-12 | Discharge: 2016-03-12 | Disposition: A | Discharge status: Home / Self Care | Payer: Medicare Other | Attending: Emergency Medicine | Admitting: Emergency Medicine

## 2016-03-12 ENCOUNTER — Encounter (HOSPITAL_COMMUNITY): Payer: Self-pay | Admitting: *Deleted

## 2016-03-12 ENCOUNTER — Ambulatory Visit (INDEPENDENT_AMBULATORY_CARE_PROVIDER_SITE_OTHER): Payer: Medicare Other

## 2016-03-12 DIAGNOSIS — S52502A Unspecified fracture of the lower end of left radius, initial encounter for closed fracture: Secondary | ICD-10-CM

## 2016-03-12 DIAGNOSIS — S52601A Unspecified fracture of lower end of right ulna, initial encounter for closed fracture: Secondary | ICD-10-CM

## 2016-03-12 MED ORDER — HYDROCODONE-ACETAMINOPHEN 5-325 MG PO TABS: 1.0000 | ORAL_TABLET | Freq: Four times a day (QID) | ORAL | Status: DC | PRN

## 2016-03-12 MED ORDER — TETANUS-DIPHTH-ACELL PERTUSSIS 5-2.5-18.5 LF-MCG/0.5 IM SUSP
INTRAMUSCULAR | Status: AC
  Filled 2016-03-12: qty 0.5

## 2016-03-12 MED ORDER — TETANUS-DIPHTH-ACELL PERTUSSIS 5-2.5-18.5 LF-MCG/0.5 IM SUSP
0.5000 mL | Freq: Once | INTRAMUSCULAR | Status: AC
  Administered 2016-03-12: 0.5 mL via INTRAMUSCULAR

## 2016-03-12 MED ORDER — HYDROCODONE-ACETAMINOPHEN 5-325 MG PO TABS
2.0000 | ORAL_TABLET | Freq: Once | ORAL | Status: AC
  Administered 2016-03-12: 2 via ORAL

## 2016-03-12 MED ORDER — HYDROCODONE-ACETAMINOPHEN 5-325 MG PO TABS
ORAL_TABLET | ORAL | Status: AC
  Filled 2016-03-12: qty 2

## 2016-03-12 NOTE — ED Notes (Signed)
Reports stumbling over a rug, causing her to fall today.  C/O left wrist pain and swelling.  Also hit mouth on edge of cabinet, causing small laceration to left inner upper lip.  Denies any loose teeth.

## 2016-03-12 NOTE — ED Notes (Signed)
Ortho tech paged  

## 2016-03-12 NOTE — Progress Notes (Signed)
Orthopedic Tech Progress Note Patient Details:  Pamela Patton Feb 25, 1946 [id number]  Ortho Devices Type of Ortho Device: Ace wrap, Arm sling, Sugartong splint Ortho Device/Splint Location: LUE Ortho Device/Splint Interventions: Ordered, Application   Braulio Bosch 03/12/2016, 7:22 PM

## 2016-03-12 NOTE — Discharge Instructions (Signed)
Cast or Splint Care Casts and splints support injured limbs and keep bones from moving while they heal.  HOME CARE  Keep the cast or splint uncovered during the drying period.  A plaster cast can take 24 to 48 hours to dry.  A fiberglass cast will dry in less than 1 hour.  Do not rest the cast on anything harder than a pillow for 24 hours.  Do not put weight on your injured limb. Do not put pressure on the cast. Wait for your doctor's approval.  Keep the cast or splint dry.  Cover the cast or splint with a plastic bag during baths or wet weather.  If you have a cast over your chest and belly (trunk), take sponge baths until the cast is taken off.  If your cast gets wet, dry it with a towel or blow dryer. Use the cool setting on the blow dryer.  Keep your cast or splint clean. Wash a dirty cast with a damp cloth.  Do not put any objects under your cast or splint.  Do not scratch the skin under the cast with an object. If itching is a problem, use a blow dryer on a cool setting over the itchy area.  Do not trim or cut your cast.  Do not take out the padding from inside your cast.  Exercise your joints near the cast as told by your doctor.  Raise (elevate) your injured limb on 1 or 2 pillows for the first 1 to 3 days. GET HELP IF:  Your cast or splint cracks.  Your cast or splint is too tight or too loose.  You itch badly under the cast.  Your cast gets wet or has a soft spot.  You have a bad smell coming from the cast.  You get an object stuck under the cast.  Your skin around the cast becomes red or sore.  You have new or more pain after the cast is put on. GET HELP RIGHT AWAY IF:  You have fluid leaking through the cast.  You cannot move your fingers or toes.  Your fingers or toes turn blue or white or are cool, painful, or puffy (swollen).  You have tingling or lose feeling (numbness) around the injured area.  You have bad pain or pressure under the  cast.  You have trouble breathing or have shortness of breath.  You have chest pain.   This information is not intended to replace advice given to you by your health care provider. Make sure you discuss any questions you have with your health care provider.   Document Released: 01/19/2011 Document Revised: 05/22/2013 Document Reviewed: 03/28/2013 Elsevier Interactive Patient Education 2016 [employer]  Radial Fracture A radial fracture is a break in the radius bone, which is the long bone of the forearm that is on the same side as your thumb. Your forearm is the part of your arm that is between your elbow and your wrist. It is made up of two bones: the radius and the ulna. Most radial fractures occur near the wrist (distal radialfracture) or near the elbow (radial head fracture). A distal radial fracture is the most common type of broken arm. This fracture usually occurs about an inch above the wrist. Fractures of the middle part of the bone are less common. CAUSES  Falling with your arm outstretched is the most common cause of a radial fracture. Other causes include:  Car accidents.  Bike accidents.  A direct blow to  the middle part of the radius. RISK FACTORS  You may be at greater risk for a distal radial fracture if you are 68 years of age or older.  You may be at greater risk for a radial head fracture if you are:  Female.  39-27 years old.  You may be at a greater risk for all types of radial fractures if you have a condition that causes your bones to be weak or thin (osteoporosis). SIGNS AND SYMPTOMS A radial fracture causes pain immediately after the injury. Other signs and symptoms include:  An abnormal bend or bump in your arm (deformity).  Swelling.  Bruising.  Numbness or tingling.  Tenderness.  Limited movement. DIAGNOSIS  Your health care provider may diagnose a radial fracture based on:  Your symptoms.  Your medical history, including any recent  injury.  A physical exam. Your health care provider will look for any deformity and feel for tenderness over the break. Your health care provider will also check whether the bone is out of place.  An X-ray exam to confirm the diagnosis and learn more about the type of fracture. TREATMENT The goals of treatment are to get the bone in proper position for healing and to keep it from moving so it will heal over time. Your treatment will depend on many factors, especially the type of fracture that you have.  If the fractured bone:  Is in the correct position (nondisplaced), you may only need to wear a cast or a splint.  Has a slightly displaced fracture, you may need to have the bones moved back into place manually (closed reduction) before the splint or cast is put on.  You may have a temporary splint before you have a plaster cast. The splint allows room for some swelling. After a few days, a cast can replace the splint.  You may have to wear the cast for about 6 weeks or as directed by your health care provider.  The cast may be changed after about 3 weeks or as directed by your health care provider.  After your cast is taken off, you may need physical therapy to regain full movement in your wrist or elbow.  You may need emergency surgery if you have:  A fractured bone that is out of position (displaced).  A fracture with multiple fragments (comminuted fracture).  A fracture that breaks the skin (open fracture). This type of fracture may require surgical wires, plates, or screws to hold the bone in place.  You may have X-rays every couple of weeks to check on your healing. HOME CARE INSTRUCTIONS  Keep the injured arm above the level of your heart while you are sitting or lying down. This helps to reduce swelling and pain.  Apply ice to the injured area:  Put ice in a plastic bag.  Place a towel between your skin and the bag.  Leave the ice on for 20 minutes, 2-3 times per  day.  Move your fingers often to avoid stiffness and to minimize swelling.  If you have a plaster or fiberglass cast:  Do not try to scratch the skin under the cast using sharp or pointed objects.  Check the skin around the cast every day. You may put lotion on any red or sore areas.  Keep your cast dry and clean.  If you have a plaster splint:  Wear the splint as directed.  Loosen the elastic around the splint if your fingers become numb and tingle, or if  they turn cold and blue.  Do not put pressure on any part of your cast until it is fully hardened. Rest your cast only on a pillow for the first 24 hours.  Protect your cast or splint while bathing or showering, as directed by your health care provider. Do not put your cast or splint into water.  Take medicines only as directed by your health care provider.  Return to activities, such as sports, as directed by your health care provider. Ask your health care provider what activities are safe for you.  Keep all follow-up visits as directed by your health care provider. This is important. SEEK MEDICAL CARE IF:  Your pain medicine is not helping.  Your cast gets damaged or it breaks.  Your cast becomes loose.  Your cast gets wet.  You have more severe pain or swelling than you did before the cast.  You have severe pain when stretching your fingers.  You continue to have pain or stiffness in your elbow or your wrist after your cast is taken off. SEEK IMMEDIATE MEDICAL CARE IF:  You cannot move your fingers.  You lose feeling in your fingers or your hand.  Your hand or your fingers turn cold and pale or blue.  You notice a bad smell coming from your cast.  You have drainage from underneath your cast.  You have new stains from blood or drainage seeping through your cast.   This information is not intended to replace advice given to you by your health care provider. Make sure you discuss any questions you have with  your health care provider.   Document Released: 03/02/2006 Document Revised: 10/10/2014 Document Reviewed: 03/14/2014 Elsevier Interactive Patient Education 2016 [employer] Inc.  Ulnar Fracture An ulnar fracture is a break in the ulna bone, which is the forearm bone that is located on the same side as your little finger. Your forearm is the part of your arm that is between your elbow and your wrist. It is made up of two bones: the radius and ulna. The ulna forms the point of your elbow at its upper end. The lower end can be felt on the outside of your wrist. An ulnar fracture can happen near the wrist or elbow or in the middle of your forearm. Middle forearm fractures usually break both the radius and the ulna. CAUSES A heavy, direct blow to the forearm is the most common cause of an ulnar fracture. It takes a lot of force to break a bone in your forearm. This type of injury may be caused by:  An accident, such as a car or bike accident.  Falling with your arm outstretched. RISK FACTORS You may be at greater risk for an ulnar fracture if you:  Play contact sports.  Have a condition that causes your bones to be weak or thin (osteoporosis). SIGNS AND SYMPTOMS  An ulnar fracture causes pain immediately after the injury. You may need to support your forearm with your other hand. Other signs and symptoms include:  An abnormal bend or bump in your arm (deformity).  Swelling.  Bruising.  Numbness or weakness in your hand.  Inability to turn your hand from side to side (rotate). DIAGNOSIS Your health care provider may diagnose an ulnar fracture based on:  Your symptoms.  Your medical history, including any recent injury.  A physical exam. Your health care provider will look for any deformity and feel for tenderness over the break. Your health care provider will also  check whether the bone is out of place.  An X-ray exam to confirm the diagnosis and learn more about the type of  fracture. TREATMENT The goals of treatment are to get the bone in proper position for healing and to keep it from moving so it will heal over time. Your treatment will depend on many factors, especially the type of fracture that you have.  If the fractured bone:  Is in the correct position (nondisplaced), you may only need to wear a cast or a splint.  Has a slightly displaced fracture, you may need to have the bones moved back into place manually (closed reduction) before the splint or cast is put on.  You may have a temporary splint before you have a plaster cast. The splint allows room for some swelling. After a few days, a cast can replace the splint.  You may have to wear the cast for about 6 weeks or as directed by your health care provider.  The cast may be changed after about 3 weeks or as directed by your health care provider.  After your cast is taken off, you may need physical therapy to regain full movement in your wrist or elbow.  You may need emergency surgery if you have:  A fractured bone that is out of position (displaced).  A fracture with multiple fragments (comminuted fracture).  A fracture that breaks the skin (open fracture). This type of fracture may require surgical wires, plates, or screws to hold the bone in place.  You may have X-rays every couple of weeks to check on your healing. HOME CARE INSTRUCTIONS  Keep the injured arm above the level of your heart while you are sitting or lying down. This helps to reduce swelling and pain.  Apply ice to the injured area:  Put ice in a plastic bag.  Place a towel between your skin and the bag.  Leave the ice on for 20 minutes, 2-3 times per day.  Move your fingers often to avoid stiffness and to minimize swelling.  If you have a plaster or fiberglass cast:  Do not try to scratch the skin under the cast using sharp or pointed objects.  Check the skin around the cast every day. You may put lotion on any red  or sore areas.  Keep your cast dry and clean.  If you have a plaster splint:  Wear the splint as directed.  Loosen the elastic around the splint if your fingers become numb and tingle, or if they turn cold and blue.  Do not put pressure on any part of your cast until it is fully hardened. Rest your cast only on a pillow for the first 24 hours.  Protect your cast or splint while bathing or showering, as directed by your health care provider. Do not put your cast or splint into water.  Take medicines only as directed by your health care provider.  Return to activities, such as sports, as directed by your health care provider. Ask your health care provider what activities are safe for you.  Keep all follow-up visits as directed by your health care provider. This is important. SEEK MEDICAL CARE IF:  Your pain medicine is not helping.  Your cast gets damaged or it breaks.  Your cast becomes loose.  Your cast gets wet.  You have more severe pain or swelling than you did before the cast.  You have severe pain when stretching your fingers.  You continue to have pain or  stiffness in your elbow or your wrist after your cast is taken off. SEEK IMMEDIATE MEDICAL CARE IF:  You cannot move your fingers.  You lose feeling in your fingers or your hand.  Your hand or your fingers turn cold and pale or blue.  You notice a bad smell coming from your cast.  You have drainage from underneath your cast.  You have new stains from blood or drainage seeping through your cast.   This information is not intended to replace advice given to you by your health care provider. Make sure you discuss any questions you have with your health care provider.   Document Released: 03/02/2006 Document Revised: 10/10/2014 Document Reviewed: 02/26/2014 Elsevier Interactive Patient Education [employer].

## 2016-03-12 NOTE — ED Provider Notes (Signed)
CSN: [id number]     Arrival date & time 03/12/16  1722 History   First MD Initiated Contact with Patient 03/12/16 1801     Chief Complaint  Patient presents with  . Fall    Patient is a 70 y.o. female presenting with fall. The history is provided by the patient.  Fall This is a new problem. The current episode started 1 to 2 hours ago. The problem has not changed since onset.Pertinent negatives include no chest pain, no abdominal pain, no headaches and no shortness of breath. The symptoms are aggravated by bending. She has tried nothing for the symptoms.  Pt reports she tripped on a rug and fell in her kitchen. Denies any CP or dizziness prior to fall. She used her (L) hand to break her fall. Since she has noticed slight pain and swelling. Small abrasions to (L) upper inner lip and mid lower lip.  Past Medical History  Diagnosis Date  . Arthritis   . Cancer ([place])   . Chicken pox   . GERD (gastroesophageal reflux disease)   . Hypertension   . High cholesterol   . Kidney disease   . UTI (urinary tract infection)   . Chest pain summer 2012  . Diverticulosis     difficulty with colonoscopy 2005  . 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 Elyse Hsu 10/08/2012  . Hypothyroid, s/p radioactive iodine tx for graves 2001 08/24/2012   Past Surgical History  Procedure Laterality Date  . Abdominal hysterectomy  1989   Family History  Problem Relation Age of Onset  . Colon cancer Other   . Lung cancer      grandparent  . Stroke    . Cancer Mother     liver  . Stroke Father 74  . Diabetes Father   . Hypertension Father   . Diabetes Maternal Grandmother   . Pancreatic cancer Brother   . Diabetes Sister   . Cancer Sister   . Diabetes Sister    Social History  Substance Use Topics  . Smoking status: Never Smoker   . Smokeless tobacco: Never Used  . Alcohol Use: No   OB History    No data available     Review of Systems    Respiratory: Negative for shortness of breath.   Cardiovascular: Negative for chest pain.  Gastrointestinal: Negative for abdominal pain.  Neurological: Negative for headaches.  All other systems reviewed and are negative.   Allergies  Review of patient's allergies indicates no known allergies.  Home Medications   Prior to Admission medications   Medication Sig Start Date End Date Taking? Authorizing Provider  AMLODIPINE BESYLATE PO Take 10 mg by mouth daily.    Yes Historical Provider, MD  aspirin 81 MG tablet Take 81 mg by mouth daily.   Yes Historical Provider, MD  ATENOLOL PO Take 50 mg by mouth 2 (two) times daily.    Yes Historical Provider, MD  atorvastatin (LIPITOR) 40 MG tablet Take 40 mg by mouth daily.   Yes Historical Provider, MD  b complex vitamins tablet Take 1 tablet by mouth daily.   Yes Historical Provider, MD  Biotin (BIOTIN 5000) 5 MG CAPS Take 5 mg by mouth daily.    Yes Historical Provider, MD  Blood Glucose Monitoring Suppl (ACCU-CHEK AVIVA) device by Other route. Use as instructed-Accu-chek Aviva Expert   Yes Historical Provider, MD  Cholecalciferol (VITAMIN D3) 2000 UNITS TABS Take 1 tablet by mouth daily.  Yes Historical Provider, MD  hydrochlorothiazide (HYDRODIURIL) 25 MG tablet Take 25 mg by mouth daily.  06/29/12  Yes Historical Provider, MD  Insulin Aspart (NOVOLOG [place]) Inject 8 Units into the skin as directed. And sliding scale based on readings.   Yes Historical Provider, MD  insulin glargine (LANTUS) 100 UNIT/ML injection Inject 25 Units into the skin at bedtime.    Yes Historical Provider, MD  levothyroxine (SYNTHROID, LEVOTHROID) 75 MCG tablet Take 75 mcg by mouth daily.   Yes Historical Provider, MD  METFORMIN HCL PO Take 500 mg by mouth 2 (two) times daily.    Yes Historical Provider, MD  Multiple Vitamins-Minerals (CENTRUM SILVER PO) Take 1 tablet by mouth daily.    Yes Historical Provider, MD  Pioglitazone HCl (ACTOS PO) Take 45 mg by mouth daily.     Yes Historical Provider, MD  Valsartan (DIOVAN PO) Take 320 mg by mouth daily.    Yes Historical Provider, MD  Calcium Carbonate-Vitamin D (CALTRATE 600+D) 600-400 MG-UNIT per tablet Take 1 tablet by mouth daily.    Historical Provider, MD  Coenzyme Q10 (COQ10 PO) Take 1 tablet by mouth daily.     Historical Provider, MD  HYDROcodone-acetaminophen (NORCO/VICODIN) 5-325 MG tablet Take 1-2 tablets by mouth every 6 (six) hours as needed for moderate pain or severe pain. 03/12/16   Jeryl Columbia, NP   Meds Ordered and Administered this Visit   Medications  Tdap (BOOSTRIX) injection 0.5 mL (0.5 mLs Intramuscular Given 03/12/16 1803)  HYDROcodone-acetaminophen (NORCO/VICODIN) 5-325 MG per tablet 2 tablet (2 tablets Oral Given 03/12/16 1903)    BP 139/58 mmHg  Pulse 66  Temp(Src) 99 F (37.2 C) (Oral)  Resp 18  SpO2 100% No data found.   Physical Exam  Constitutional: She is oriented to person, place, and time. She appears well-developed and well-nourished.  HENT:  Head: Normocephalic.  Right Ear: External ear normal.  Left Ear: External ear normal.  Mouth/Throat:    Eyes: Conjunctivae are normal.  Cardiovascular: Normal rate.   Musculoskeletal:       Left wrist: She exhibits decreased range of motion, tenderness, bony tenderness and swelling. She exhibits no crepitus, no deformity and no laceration.  Neurological: She is alert and oriented to person, place, and time.  Skin: Skin is warm and dry.  Psychiatric: She has a normal mood and affect.  Nursing note and vitals reviewed.   ED Course  Procedures (including critical care time)  Labs Review Labs Reviewed - No data to display  Imaging Review Dg Wrist Complete Left  03/12/2016  CLINICAL DATA:  Left wrist pain when the patient fell when her foot caught on a rug while walking through her house. EXAM: LEFT WRIST - COMPLETE 3+ VIEW COMPARISON:  None. FINDINGS: Mild comminuted fracture through the distal ulnar metaphysis  and base of the ulnar styloid with mild radial displacement and angulation of the distal fragment. There is also an essentially nondisplaced fracture in the distal radial metaphysis, involving the radial styloid with possible extension into the radiocarpal joint. No significant displacement or angulation. IMPRESSION: Distal radius and ulna fractures, as described above. Electronically Signed   By: Claudie Revering M.D.   On: 03/12/2016 18:30     Visual Acuity Review  Right Eye Distance:   Left Eye Distance:   Bilateral Distance:    Right Eye Near:   Left Eye Near:    Bilateral Near:         MDM   1. Distal radial  fracture, left, closed, initial encounter   2. Right distal ulnar fracture, closed, initial encounter   Due to a fall at home. Consulted w/ Dr Grandville Silos w/ [facility]. Recommended sugartong splint and office will call pt MOnday to arrange f/u. Rx: Vicodin 1-2 q6h prn pain #15 Home care and f/u instructions discussed and provided in print.    Jeryl Columbia, NP 03/12/16 1946

## 2016-03-22 ENCOUNTER — Encounter: Payer: Self-pay | Admitting: Family Medicine

## 2016-03-22 ENCOUNTER — Ambulatory Visit (INDEPENDENT_AMBULATORY_CARE_PROVIDER_SITE_OTHER): Payer: Medicare Other | Admitting: Family Medicine

## 2016-03-22 VITALS — BP 124/70 | HR 63 | Temp 98.6°F | Ht 65.75 in | Wt 150.8 lb

## 2016-03-22 DIAGNOSIS — S5292XA Unspecified fracture of left forearm, initial encounter for closed fracture: Secondary | ICD-10-CM

## 2016-03-22 DIAGNOSIS — E1129 Type 2 diabetes mellitus with other diabetic kidney complication: Secondary | ICD-10-CM

## 2016-03-22 DIAGNOSIS — E039 Hypothyroidism, unspecified: Secondary | ICD-10-CM | POA: Diagnosis not present

## 2016-03-22 DIAGNOSIS — E785 Hyperlipidemia, unspecified: Secondary | ICD-10-CM

## 2016-03-22 DIAGNOSIS — Z794 Long term (current) use of insulin: Secondary | ICD-10-CM

## 2016-03-22 DIAGNOSIS — Z0000 Encounter for general adult medical examination without abnormal findings: Secondary | ICD-10-CM

## 2016-03-22 DIAGNOSIS — N183 Chronic kidney disease, stage 3 unspecified: Secondary | ICD-10-CM

## 2016-03-22 DIAGNOSIS — R634 Abnormal weight loss: Secondary | ICD-10-CM | POA: Diagnosis not present

## 2016-03-22 DIAGNOSIS — E2839 Other primary ovarian failure: Secondary | ICD-10-CM | POA: Diagnosis not present

## 2016-03-22 DIAGNOSIS — I10 Essential (primary) hypertension: Secondary | ICD-10-CM

## 2016-03-22 NOTE — Patient Instructions (Addendum)
BEFORE YOU LEAVE: -visit with Sharyn Lull for Advanced Directives discussion -follow up in 3-4 months  -I placed a referral to the gastroenterologist and the nutritionist regarding the weight loss.   -We placed a referral for you as discussed for the bone density test. It usually takes about 1-2 weeks to process and schedule this referral. If you have not heard from [place] regarding this appointment in 2 weeks please contact our office.  -3 meals daily and snacks. Quick easy things like peanut butter, [place] breast lunch meat, baked chicken breasts, frozen vegetables, etc.

## 2016-03-22 NOTE — Progress Notes (Signed)
Spent 18 minutes discussing the following issues regarding advance directives:  -Goals of care -Code status -Difference between hospice and palliative care -Living Will -Reason and significance for power of attorney -Difference between MOST form/DNR -What entails a full code status Those present during discussion included myself and patient. Patient was alert and oriented x3 with no impairment. No paperwork was completed during this time.  -Advance Directive packet was given to patient to take home and instructed to call with questions and/or to bring notarized packet back once completed.

## 2016-03-22 NOTE — Progress Notes (Signed)
Medicare Annual Preventive Care Visit  (initial annual wellness or annual wellness exam)  Concerns and/or follow up today:  L radial and ulnar rxs: -left handed -mechanical fall 1 week ago -seeing Dr. Maggie Font, [facility] -L hand dominant  DM/Hypothyroidism:: -sees endocrine,Hgba1c 7.2, she reports her TSH has been normal too - labs done recently -complications: kidney disease -meds: synthroid, lantus, aspar with meals, metformin, actos, arb, asa -eye exam: done - [facility] -foot exam: done  -denies: hypoglycemia, vision changes, wounds  Anxiety and stress/weight loss: -some stress with recent fx and having to eat other's cooking -she has a hx of loosing weight with stress  -admits is not likely getting enough calories as she has difficulty balancing healthy eating for her diabetes and busy schedule with getting regular meals -now has lost more weight, had period of loose bowels, but bowels normal now -denies: fevers, malaise, dizziness, cough, abd pain, diarrhea, constipation, melena, hematochezia - reports last colon ca screen not complete and then had barium swallow  HTN/HLD: -sees Dr. Gwenlyn Found for this -meds: anlodipine 10, valsartan 320. hctz 25, atorvastatin 40 -denies: CP, SOB, DOE -she reports she had carotid artery scan and was told this was good  ROS: negative for report of fevers, unintentional weight loss, vision changes, vision loss, hearing loss or change, chest pain, sob, hemoptysis, melena, hematochezia, hematuria, genital discharge or lesions, falls, bleeding or bruising, loc, thoughts of suicide or self harm, memory loss  1.) Patient-completed health risk assessment  - completed and reviewed, see scanned documentation  2.) Review of Medical History: -PMH, PSH, Family History and current specialty and care providers reviewed and updated and listed below  - see scanned in document in chart and below  Past Medical History  Diagnosis Date  . Arthritis   .  Cancer ([place])   . Chicken pox   . GERD (gastroesophageal reflux disease)   . Hypertension   . High cholesterol   . Kidney disease   . UTI (urinary tract infection)   . Chest pain summer 2012  . Diverticulosis     difficulty with colonoscopy 2005  . 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 Elyse Hsu 10/08/2012  . Hypothyroid, s/p radioactive iodine tx for graves 2001 08/24/2012    Past Surgical History  Procedure Laterality Date  . Abdominal hysterectomy  1989    Social History   Social History  . Marital Status: Married    Spouse Name: N/A  . Number of Children: N/A  . Years of Education: N/A   Occupational History  . Not on file.   Social History Main Topics  . Smoking status: Never Smoker   . Smokeless tobacco: Never Used  . Alcohol Use: No  . Drug Use: No  . Sexual Activity: Not on file   Other Topics Concern  . 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 Colon cancer Other   . Lung cancer  grandparent  . Stroke    . Cancer Mother     liver  . Stroke Father 67  . Diabetes Father   . Hypertension Father   . Diabetes Maternal Grandmother   . Pancreatic cancer Brother   . Diabetes Sister   . Cancer Sister   . Diabetes Sister     Current Outpatient Prescriptions on File Prior to Visit  Medication Sig Dispense Refill  . AMLODIPINE BESYLATE PO Take 10 mg by mouth daily.     Marland Kitchen aspirin 81 MG tablet Take 81 mg by mouth daily.    . ATENOLOL PO Take 50 mg by mouth 2 (two) times daily.     Marland Kitchen atorvastatin (LIPITOR) 40 MG  tablet Take 40 mg by mouth daily.    Marland Kitchen b complex vitamins tablet Take 1 tablet by mouth daily.    . Biotin (BIOTIN 5000) 5 MG CAPS Take 5 mg by mouth daily.     . Blood Glucose Monitoring Suppl (ACCU-CHEK AVIVA) device by Other route. Use as instructed-Accu-chek Environmental health practitioner    . Calcium Carbonate-Vitamin D (CALTRATE 600+D) 600-400 MG-UNIT per tablet Take 1 tablet by mouth daily.    . Cholecalciferol (VITAMIN D3) 2000 UNITS TABS Take 1 tablet by mouth daily.     . Coenzyme Q10 (COQ10 PO) Take 1 tablet by mouth daily.     . hydrochlorothiazide (HYDRODIURIL) 25 MG tablet Take 25 mg by mouth daily.     . Insulin Aspart (NOVOLOG [place]) Inject 8 Units into the skin as directed. And sliding scale based on readings.    . insulin glargine (LANTUS) 100 UNIT/ML injection Inject 25 Units into the skin at bedtime.     Marland Kitchen levothyroxine (SYNTHROID, LEVOTHROID) 75 MCG tablet Take 75 mcg by mouth daily.    Marland Kitchen METFORMIN HCL PO Take 500 mg by mouth 2 (two) times daily.     . Multiple Vitamins-Minerals (CENTRUM SILVER PO) Take 1 tablet by mouth daily.     . Pioglitazone HCl (ACTOS PO) Take 45 mg by mouth daily.     . Valsartan (DIOVAN PO) Take 320 mg by mouth daily.      No current facility-administered medications on file prior to visit.     3.) Review of functional ability and level of safety:  Any difficulty hearing?  NO  History of falling?  NO  Any trouble with IADLs - using a phone, using transportation, grocery shopping, preparing meals, doing housework, doing laundry, taking medications and managing money? NO  Advance Directives? NO - she would like to discuss if possible  See summary of recommendations in Patient Instructions below.  4.) Physical Exam Filed Vitals:   03/22/16 0742  BP: 124/70  Pulse: 63  Temp: 98.6 F (37 C)   Estimated body mass index is 24.53 kg/(m^2) as calculated from the following:   Height as of this encounter: 5' 5.75" (1.67 m).   Weight as of this encounter:  150 lb 12.8 oz (68.402 kg).  EKG (optional): deferred  General: alert, appear well hydrated and in no acute distress  HEENT: visual acuity grossly intact  CV: HRRR  Lungs: CTA bilaterally  Psych: pleasant and cooperative, no obvious depression or anxiety  Cognitive function grossly intact  See patient instructions for recommendations.  Education and counseling regarding the above review of health provided with a plan for the following: -see scanned patient completed form for further details -fall prevention strategies discussed  -healthy lifestyle discussed -importance and resources for completing advanced  directives discussed -see patient instructions below for any other recommendations provided  4)The following written screening schedule of preventive measures were reviewed with assessment and plan made per below, orders and patient instructions:      AAA screening done if applicable     Alcohol screening done     Obesity Screening and counseling done     STI screening (Hep C if born 1947-65) offered and per pt wishes     Tobacco Screening done        Pneumococcal (PPSV23 -one dose after 64, one before if risk factors), influenza yearly and hepatitis B vaccines (if high risk - end stage renal disease, IV drugs, homosexual men, live in home for mentally retarded, hemophilia receiving factors) ASSESSMENT/PLAN: done if applicable      Screening mammograph (yearly if >40) ASSESSMENT/PLAN: utd       Screening Pap smear/pelvic exam (q2 years) ASSESSMENT/PLAN: n/a      Colorectal cancer screening (FOBT yearly or flex sig q4y or colonoscopy q10y or barium enema q4y) ASSESSMENT/PLAN: utd      Diabetes outpatient self-management training services ASSESSMENT/PLAN: utd or done - sees endocrinologist      Bone mass measurements(covered q2y if indicated - estrogen def, osteoporosis, hyperparathyroid, vertebral abnormalities, osteoporosis or steroids) ASSESSMENT/PLAN: recent  fracture, recommended and reordered the bone density test      Screening for glaucoma(q1y if high risk - diabetes, FH, AA and > 50 or hispanic and > 65) ASSESSMENT/PLAN: utd, sees opthomologist      Medical nutritional therapy for individuals with diabetes or renal disease ASSESSMENT/PLAN: sees endo      Cardiovascular screening blood tests (lipids q5y) ASSESSMENT/PLAN: does with endo      Diabetes screening tests ASSESSMENT/PLAN: does with endo   7.) Summary:  Visit for preventive health examination - Plan: Hep C Antibody -risk factors and conditions per above assessment were discussed and treatment, recommendations and referrals were offered per documentation above and orders and patient instructions. -advance directive discussion with RN  Estrogen deficiency - Plan: DG Bone Density Radial fracture, left, closed, initial encounter - -and ulnar fx per pt, 6/2-17-seeing orthopedic doctor, [facility] - Plan: DG Bone Density -seeing ortho -advised bone density testing, we ordered in the past but she did not do  Weight loss - Plan: Ambulatory referral to Gastroenterology, Amb ref to Medical Nutrition Therapy-MNT, CBC with Differential/Platelets -possibly linked to stress and poor eating habits - she stresses about eating right for her diabetes and ends up not eating sometimes -referral to nutrition, advised healthy regular protein and calorie rich foods nad discussed several easy quick options -referral to GI as well to ensure no other underlying cause wt loss  Hypothyroidism, unspecified hypothyroidism type -managed by endo, stable  Essential hypertension -stable  Type 2 diabetes mellitus with other diabetic kidney complication, with long-term current use of insulin (HCC) -stable, managed by endo  Hyperlipemia -stable, labs monitored by endo  CKD (chronic kidney disease) stage 3, GFR 30-59 ml/min -stable, last labs 02/2016  Patient Instructions  BEFORE YOU  LEAVE: -visit with Sharyn Lull for Advanced Directives discussion -follow up in 3-4 months  -I placed a referral to the gastroenterologist and the nutritionist regarding the weight loss.   -We placed a referral for you as discussed for the bone density test. It usually takes about 1-2 weeks to process and schedule this referral. If you have not heard from [place] regarding this appointment in 2 weeks please contact our office.  -3 meals daily  and snacks. Quick easy things like peanut butter, [place] breast lunch meat, baked chicken breasts, frozen vegetables, etc.

## 2016-03-22 NOTE — Progress Notes (Signed)
Pre visit review using our clinic review tool, if applicable. No additional management support is needed unless otherwise documented below in the visit note. 

## 2016-03-31 ENCOUNTER — Telehealth: Payer: Self-pay | Admitting: *Deleted

## 2016-03-31 NOTE — Telephone Encounter (Signed)
I left a detailed message for the pt to call the office to schedule a lab appt.

## 2016-03-31 NOTE — Telephone Encounter (Signed)
-----   Message from Lucretia Kern, DO sent at 03/30/2016 10:28 AM EDT ----- Can you check on why labs not done and set up lab appt if needed? Thanks. ----- Message -----    From: SYSTEM    Sent: 03/27/2016  12:05 AM      To: Lucretia Kern, DO

## 2016-04-14 ENCOUNTER — Telehealth: Payer: Self-pay | Admitting: Family Medicine

## 2016-04-14 DIAGNOSIS — Z0000 Encounter for general adult medical examination without abnormal findings: Secondary | ICD-10-CM

## 2016-04-14 DIAGNOSIS — R634 Abnormal weight loss: Secondary | ICD-10-CM

## 2016-04-14 NOTE — Addendum Note (Signed)
Addended by: Agnes Lawrence on: 04/14/2016 03:16 PM   Modules accepted: Orders

## 2016-04-14 NOTE — Telephone Encounter (Signed)
Pt had cpe on 6/20 and forgot to go to the lab afterwards. Pt would like to come in Monday 7/17 for the fasting labs. Is that ok and can you put the order in?

## 2016-04-14 NOTE — Telephone Encounter (Signed)
Ok to order labs from her visit that she missed. Thanks.

## 2016-04-14 NOTE — Telephone Encounter (Signed)
Orders re-entered and I called the pt and informed her of this.

## 2016-04-18 ENCOUNTER — Other Ambulatory Visit (INDEPENDENT_AMBULATORY_CARE_PROVIDER_SITE_OTHER): Payer: Medicare Other

## 2016-04-18 DIAGNOSIS — R634 Abnormal weight loss: Secondary | ICD-10-CM | POA: Diagnosis not present

## 2016-04-18 DIAGNOSIS — Z0000 Encounter for general adult medical examination without abnormal findings: Secondary | ICD-10-CM

## 2016-04-19 LAB — CBC WITH DIFFERENTIAL/PLATELET
BASOS PCT: 0.8 % (ref 0.0–3.0)
Basophils Absolute: 0 10*3/uL (ref 0.0–0.1)
EOS ABS: 0 10*3/uL (ref 0.0–0.7)
Eosinophils Relative: 0.6 % (ref 0.0–5.0)
HEMATOCRIT: 31.4 % — AB (ref 36.0–46.0)
Hemoglobin: 10.1 g/dL — ABNORMAL LOW (ref 12.0–15.0)
LYMPHS ABS: 1.1 10*3/uL (ref 0.7–4.0)
LYMPHS PCT: 21.3 % (ref 12.0–46.0)
MCHC: 32.2 g/dL (ref 30.0–36.0)
MCV: 88.9 fl (ref 78.0–100.0)
MONOS PCT: 5.3 % (ref 3.0–12.0)
Monocytes Absolute: 0.3 10*3/uL (ref 0.1–1.0)
NEUTROS ABS: 3.8 10*3/uL (ref 1.4–7.7)
NEUTROS PCT: 72 % (ref 43.0–77.0)
PLATELETS: 172 10*3/uL (ref 150.0–400.0)
RBC: 3.53 Mil/uL — ABNORMAL LOW (ref 3.87–5.11)
RDW: 15.8 % — AB (ref 11.5–15.5)
WBC: 5.3 10*3/uL (ref 4.0–10.5)

## 2016-04-19 LAB — HEPATITIS C ANTIBODY: HCV Ab: NEGATIVE

## 2016-04-21 ENCOUNTER — Encounter: Payer: Self-pay | Admitting: Gastroenterology

## 2016-06-03 ENCOUNTER — Encounter: Payer: Self-pay | Admitting: Gastroenterology

## 2016-06-03 ENCOUNTER — Other Ambulatory Visit (INDEPENDENT_AMBULATORY_CARE_PROVIDER_SITE_OTHER): Payer: Medicare Other

## 2016-06-03 ENCOUNTER — Ambulatory Visit (INDEPENDENT_AMBULATORY_CARE_PROVIDER_SITE_OTHER): Payer: Medicare Other | Admitting: Gastroenterology

## 2016-06-03 VITALS — BP 124/64 | Ht 67.0 in | Wt 145.0 lb

## 2016-06-03 DIAGNOSIS — D649 Anemia, unspecified: Secondary | ICD-10-CM

## 2016-06-03 DIAGNOSIS — Z800 Family history of malignant neoplasm of digestive organs: Secondary | ICD-10-CM

## 2016-06-03 DIAGNOSIS — R634 Abnormal weight loss: Secondary | ICD-10-CM | POA: Diagnosis not present

## 2016-06-03 LAB — COMPREHENSIVE METABOLIC PANEL
ALBUMIN: 4.7 g/dL (ref 3.5–5.2)
ALK PHOS: 58 U/L (ref 39–117)
ALT: 10 U/L (ref 0–35)
AST: 13 U/L (ref 0–37)
BILIRUBIN TOTAL: 0.6 mg/dL (ref 0.2–1.2)
BUN: 23 mg/dL (ref 6–23)
CALCIUM: 9.8 mg/dL (ref 8.4–10.5)
CO2: 31 mEq/L (ref 19–32)
CREATININE: 1.33 mg/dL — AB (ref 0.40–1.20)
Chloride: 106 mEq/L (ref 96–112)
GFR: 50.48 mL/min — AB (ref >60.00)
Glucose, Bld: 226 mg/dL — ABNORMAL HIGH (ref 70–99)
Potassium: 4 mEq/L (ref 3.5–5.1)
Sodium: 145 mEq/L (ref 135–145)
Total Protein: 7.8 g/dL (ref 6.0–8.3)

## 2016-06-03 LAB — CBC WITH DIFFERENTIAL/PLATELET
BASOS ABS: 0 10*3/uL (ref 0.0–0.1)
BASOS PCT: 0.7 % (ref 0.0–3.0)
EOS ABS: 0 10*3/uL (ref 0.0–0.7)
Eosinophils Relative: 0.3 % (ref 0.0–5.0)
HEMATOCRIT: 33.8 % — AB (ref 36.0–46.0)
HEMOGLOBIN: 11.3 g/dL — AB (ref 12.0–15.0)
LYMPHS PCT: 29.5 % (ref 12.0–46.0)
Lymphs Abs: 1.8 10*3/uL (ref 0.7–4.0)
MCHC: 33.3 g/dL (ref 30.0–36.0)
MCV: 88.3 fl (ref 78.0–100.0)
MONOS PCT: 8.6 % (ref 3.0–12.0)
Monocytes Absolute: 0.5 10*3/uL (ref 0.1–1.0)
NEUTROS ABS: 3.8 10*3/uL (ref 1.4–7.7)
Neutrophils Relative %: 60.9 % (ref 43.0–77.0)
Platelets: 220 10*3/uL (ref 150.0–400.0)
RBC: 3.82 Mil/uL — ABNORMAL LOW (ref 3.87–5.11)
RDW: 15.6 % — ABNORMAL HIGH (ref 11.5–15.5)
WBC: 6.3 10*3/uL (ref 4.0–10.5)

## 2016-06-03 LAB — IBC PANEL
IRON: 106 ug/dL (ref 42–145)
SATURATION RATIOS: 27.5 % (ref 20.0–50.0)
TRANSFERRIN: 275 mg/dL (ref 212.0–360.0)

## 2016-06-03 LAB — FERRITIN: Ferritin: 46.8 ng/mL (ref 10.0–291.0)

## 2016-06-03 MED ORDER — NA SULFATE-K SULFATE-MG SULF 17.5-3.13-1.6 GM/177ML PO SOLN: 1.0000 | Freq: Once | ORAL | 0 refills | Status: AC

## 2016-06-03 NOTE — Progress Notes (Signed)
HPI :  70 y/o female with a history of DM, GERD, hypothyroidism, here for a new patient visit for weight loss and anemia.   Patient reports she has lost about 45 lbs over one and half years. Went from 190 lbs to 145 lbs. She thinks about 15 lbs of this has been in the past few months alone. She reports initially during this time when she retired she had been exercising more, and then was under a good deal of stress with family illness which she thinks made things worse. She reports she has stopped exercising and is not trying to lose weight but she continues to lose weight and now states she thinks she is losing muscle mass. She had a recent fall this past summer and broke her wrist.   She denies any abdominal pains. No nausea or vomiting. She reports a good appetite, and she is eating her meals as per her norm. She reports mostly formed stools, sometime loose stools. She denies any blood in the stools. She thinks her last colonoscopy was 12 years ago, and was an incomplete exam, unable to traverse the left colon due to severe diverticulosis.   She otherwise has a mild anemia on labs, with Hgb 10.1 with MCV of 89.   Brother had pancreatic cancer, diagnosed at age 75. Sister at age 72s and mother had liver cancer, no reported cirrhosis.   Barium enema 2005 after incomplete colonoscopy, sigmoid diverticulosis   Past Medical History:  Diagnosis Date  . Arthritis   . Cancer ([place])   . Chest pain summer 2012  . Chicken pox   . Diverticulosis    difficulty with colonoscopy 2005  . 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 Elyse Hsu 10/08/2012  . GERD (gastroesophageal reflux disease)   . High cholesterol   . Hypertension   . Hypothyroid, s/p radioactive iodine tx for graves 2001 08/24/2012  . Kidney disease   . UTI (urinary tract infection)      Past Surgical History:  Procedure Laterality Date  . ABDOMINAL HYSTERECTOMY  1989   Family  History  Problem Relation Age of Onset  . Cancer Mother     liver  . Stroke Father 76  . Diabetes Father   . Hypertension Father   . Diabetes Maternal Grandmother   . Pancreatic cancer Brother   . Diabetes Sister   . Cancer Sister   . Diabetes Sister   . Colon cancer Other   . Lung cancer      grandparent  . Stroke     Social History  Substance Use Topics  . Smoking status: Never Smoker  . Smokeless tobacco: Never Used  . Alcohol use No   Current Outpatient Prescriptions  Medication Sig Dispense Refill  . AMLODIPINE BESYLATE PO Take 10 mg by mouth daily.     Marland Kitchen aspirin 81 MG tablet Take 81 mg by mouth daily.    . ATENOLOL PO Take 50 mg by mouth 2 (two) times daily.     Marland Kitchen atorvastatin (LIPITOR) 40 MG tablet Take 40 mg by mouth daily.    Marland Kitchen b complex vitamins tablet Take 1 tablet by mouth daily.    . Biotin (BIOTIN 5000) 5 MG CAPS Take 5 mg by mouth daily.     . Blood Glucose Monitoring Suppl (ACCU-CHEK AVIVA) device by Other route. Use as instructed-Accu-chek Environmental health practitioner    . Calcium Carbonate-Vitamin D (CALTRATE 600+D) 600-400 MG-UNIT per tablet Take 1 tablet  by mouth daily.    . Cholecalciferol (VITAMIN D3) 2000 UNITS TABS Take 1 tablet by mouth daily.     . Coenzyme Q10 (COQ10 PO) Take 1 tablet by mouth daily.     . hydrochlorothiazide (HYDRODIURIL) 25 MG tablet Take 25 mg by mouth daily.     . Insulin Aspart (NOVOLOG [place]) Inject 8 Units into the skin as directed. And sliding scale based on readings.    . insulin glargine (LANTUS) 100 UNIT/ML injection Inject 25 Units into the skin at bedtime.     Marland Kitchen levothyroxine (SYNTHROID, LEVOTHROID) 75 MCG tablet Take 75 mcg by mouth daily.    Marland Kitchen METFORMIN HCL PO Take 500 mg by mouth 2 (two) times daily.     . Multiple Vitamins-Minerals (CENTRUM SILVER PO) Take 1 tablet by mouth daily.     . Pioglitazone HCl (ACTOS PO) Take 45 mg by mouth daily.     . Valsartan (DIOVAN PO) Take 320 mg by mouth daily.      No current  facility-administered medications for this visit.    No Known Allergies   Review of Systems: All systems reviewed and negative except where noted in HPI.    Lab Results  Component Value Date   WBC 6.3 06/03/2016   HGB 11.3 (L) 06/03/2016   HCT 33.8 (L) 06/03/2016   MCV 88.3 06/03/2016   PLT 220.0 06/03/2016    Lab Results  Component Value Date   CREATININE 1.33 (H) 06/03/2016   BUN 23 06/03/2016   NA 145 06/03/2016   K 4.0 06/03/2016   CL 106 06/03/2016   CO2 31 06/03/2016    Lab Results  Component Value Date   ALT 10 06/03/2016   AST 13 06/03/2016   ALKPHOS 58 06/03/2016   BILITOT 0.6 06/03/2016   Lab Results  Component Value Date   IRON 106 06/03/2016   FERRITIN 46.8 06/03/2016      Physical Exam: BP 124/64   Ht 5\' 7"  (1.702 m)   Wt 145 lb (65.8 kg)   BMI 22.71 kg/m  Constitutional: Pleasant,well-developed, female in no acute distress. HEENT: Normocephalic and atraumatic.  Neck supple.  Cardiovascular: Normal rate, regular rhythm. 2/6 SEM Pulmonary/chest: Effort normal and breath sounds normal. No wheezing, rales or rhonchi. Abdominal: Soft, nondistended, nontender. Bowel sounds active throughout. There are no masses palpable but some fullness in the periumbilical area. No hepatomegaly. Extremities: no edema Lymphadenopathy: No cervical adenopathy noted. Neurological: Alert and oriented to person place and time. Skin: Skin is warm and dry. No rashes noted. Psychiatric: Normal mood and affect. Behavior is normal.   ASSESSMENT AND PLAN: 70 y/o female with strong family history of pancreatic cancer and liver cancer, presenting with 45 lbs of weight loss of the past 18 months, which continues to progress. Mild anemia noted, iron stores obtained today were normal. LFTs otherwise normal. Otherwise fairly asymptomatic. Given her family history of malignancy and weight loss, will start imaging, CT C/A/P, ensure no malignancy. She is otherwise overdue for her  colon cancer screening, and pending results of CT scan do not show an obvious cause, will proceed with colonoscopy and EGD. Discussed risks / benefits of endoscopy and she wished to proceed if needed. Her last colonoscopy was technically challenging and unable to complete, she understands this is a possibility again. Will relay CT results once available.   [place] Cellar, MD [facility] Pager [phone]  CC: Lucretia Kern, DO

## 2016-06-03 NOTE — Patient Instructions (Addendum)
You have been scheduled for a CT scan of the abdomen and pelvis at [facility] ([address]---this is in the same building as Press [facility]).   You are scheduled on 06/16/2016 at 1pm. You should arrive 15 minutes prior to your appointment time for registration. Please follow the written instructions below on the day of your exam:  WARNING: IF YOU ARE ALLERGIC TO IODINE/X-RAY DYE, PLEASE NOTIFY RADIOLOGY IMMEDIATELY AT [id number]! YOU WILL BE GIVEN A 13 HOUR PREMEDICATION PREP.  1) Do not eat or drink anything after 9am (4 hours prior to your test) 2) You have been given 2 bottles of oral contrast to drink. The solution may taste               better if refrigerated, but do NOT add ice or any other liquid to this solution. Shake             well before drinking.    Drink 1 bottle of contrast @ 11am (2 hours prior to your exam)  Drink 1 bottle of contrast @ 12pm (1 hour prior to your exam)  You may take any medications as prescribed with a small amount of water except for the following: Metformin, Glucophage, Glucovance, Avandamet, Riomet, Fortamet, Actoplus Met, Janumet, Glumetza or Metaglip. The above medications must be held the day of the exam AND 48 hours after the exam.  The purpose of you drinking the oral contrast is to aid in the visualization of your intestinal tract. The contrast solution may cause some diarrhea. Before your exam is started, you will be given a small amount of fluid to drink. Depending on your individual set of symptoms, you may also receive an intravenous injection of x-ray contrast/dye. Plan on being at [facility] for 30 minutes or longer, depending on the type of exam you are having performed.  This test typically takes 30-45 minutes to complete.  If you have any questions regarding your exam or if you need to reschedule, you may call the CT department at [phone] between the hours of 8:00 am and 5:00 pm, Monday-Friday.     You  have been scheduled for an endoscopy and colonoscopy. Please follow the written instructions given to you at your visit today. Please pick up your prep supplies at the pharmacy within the next 1-3 days. If you use inhalers (even only as needed), please bring them with you on the day of your procedure. Your physician has requested that you go to www.startemmi.com and enter the access code given to you at your visit today. This web site gives a general overview about your procedure. However, you should still follow specific instructions given to you by our office regarding your preparation for the procedure.  Your physician has requested that you go to the basement for the following lab work before leaving today:  CBC,CMET,IBC PANEL,FERRITIN   ________________________________________________________________________

## 2016-06-16 ENCOUNTER — Encounter: Payer: Self-pay | Admitting: Gastroenterology

## 2016-06-16 ENCOUNTER — Other Ambulatory Visit: Payer: Self-pay

## 2016-06-16 ENCOUNTER — Ambulatory Visit (INDEPENDENT_AMBULATORY_CARE_PROVIDER_SITE_OTHER)
Admission: RE | Admit: 2016-06-16 | Discharge: 2016-06-16 | Disposition: A | Discharge status: Home / Self Care | Payer: Medicare Other | Source: Ambulatory Visit | Attending: Gastroenterology | Admitting: Gastroenterology

## 2016-06-16 DIAGNOSIS — K7689 Other specified diseases of liver: Secondary | ICD-10-CM | POA: Diagnosis not present

## 2016-06-16 DIAGNOSIS — D649 Anemia, unspecified: Secondary | ICD-10-CM

## 2016-06-16 DIAGNOSIS — Z800 Family history of malignant neoplasm of digestive organs: Secondary | ICD-10-CM | POA: Diagnosis not present

## 2016-06-16 DIAGNOSIS — R634 Abnormal weight loss: Secondary | ICD-10-CM

## 2016-06-16 DIAGNOSIS — I700 Atherosclerosis of aorta: Secondary | ICD-10-CM | POA: Diagnosis not present

## 2016-06-16 DIAGNOSIS — M899 Disorder of bone, unspecified: Secondary | ICD-10-CM | POA: Diagnosis not present

## 2016-06-16 DIAGNOSIS — I2510 Atherosclerotic heart disease of native coronary artery without angina pectoris: Secondary | ICD-10-CM

## 2016-06-16 DIAGNOSIS — N281 Cyst of kidney, acquired: Secondary | ICD-10-CM

## 2016-06-16 DIAGNOSIS — N289 Disorder of kidney and ureter, unspecified: Secondary | ICD-10-CM

## 2016-06-16 MED ORDER — IOPAMIDOL (ISOVUE-300) INJECTION 61%
100.0000 mL | Freq: Once | INTRAVENOUS | Status: AC | PRN
  Administered 2016-06-16: 100 mL via INTRAVENOUS

## 2016-06-17 ENCOUNTER — Other Ambulatory Visit: Payer: Self-pay

## 2016-06-17 ENCOUNTER — Telehealth: Payer: Self-pay | Admitting: Gastroenterology

## 2016-06-17 DIAGNOSIS — C649 Malignant neoplasm of unspecified kidney, except renal pelvis: Secondary | ICD-10-CM

## 2016-06-17 NOTE — Telephone Encounter (Signed)
Dr Havery Moros the pt would like a call back she states when she spoke with you yesterday she could not hear you on the phone and would like another call to discuss.

## 2016-06-17 NOTE — Telephone Encounter (Signed)
I called patient back. She did not have any additional questions, she had a hard time hearing someone else who called from [facility] but she understood the CT findings and plans for MRI

## 2016-06-17 NOTE — Telephone Encounter (Signed)
MRI at [facility] 06/21/16 arrive at 1:30 pm. Fast for 4 hours prior to appointment.  I have left a message for her to call me back. Call to Central Scheduling and awaiting return call about scheduling the Body Scan.

## 2016-06-20 NOTE — Telephone Encounter (Signed)
Spoke with the patient. She is aware of her appointments for the MRI on 06/21/16 at [facility] 1:45 pm Body Scan at [facility] 06/29/16 starting at 9:00 am and returning at 12:00 pm

## 2016-06-21 ENCOUNTER — Ambulatory Visit (HOSPITAL_COMMUNITY)
Admission: RE | Admit: 2016-06-21 | Discharge: 2016-06-21 | Disposition: A | Discharge status: Home / Self Care | Payer: Medicare Other | Source: Ambulatory Visit | Attending: Gastroenterology | Admitting: Gastroenterology

## 2016-06-21 DIAGNOSIS — N289 Disorder of kidney and ureter, unspecified: Secondary | ICD-10-CM | POA: Diagnosis not present

## 2016-06-21 DIAGNOSIS — R634 Abnormal weight loss: Secondary | ICD-10-CM | POA: Insufficient documentation

## 2016-06-21 DIAGNOSIS — K769 Liver disease, unspecified: Secondary | ICD-10-CM | POA: Diagnosis not present

## 2016-06-21 MED ORDER — GADOBENATE DIMEGLUMINE 529 MG/ML IV SOLN
15.0000 mL | Freq: Once | INTRAVENOUS | Status: AC | PRN
  Administered 2016-06-21: 15 mL via INTRAVENOUS

## 2016-06-23 ENCOUNTER — Telehealth: Payer: Self-pay | Admitting: Gastroenterology

## 2016-06-23 NOTE — Telephone Encounter (Signed)
Patient rescheduled to October 26.  She verbalized understanding of new instructions dates

## 2016-06-29 ENCOUNTER — Encounter (HOSPITAL_COMMUNITY)
Admission: RE | Admit: 2016-06-29 | Discharge: 2016-06-29 | Disposition: A | Discharge status: Home / Self Care | Payer: Medicare Other | Source: Ambulatory Visit | Attending: Gastroenterology | Admitting: Gastroenterology

## 2016-06-29 DIAGNOSIS — C649 Malignant neoplasm of unspecified kidney, except renal pelvis: Secondary | ICD-10-CM | POA: Insufficient documentation

## 2016-06-29 MED ORDER — TECHNETIUM TC 99M MEDRONATE IV KIT
20.4000 | PACK | Freq: Once | INTRAVENOUS | Status: AC | PRN
  Administered 2016-06-29: 20.4 via INTRAVENOUS

## 2016-06-30 ENCOUNTER — Encounter: Payer: Medicare Other | Admitting: Gastroenterology

## 2016-07-21 IMAGING — DX DG WRIST COMPLETE 3+V*L*
4 series · 4 of 4 positions shown · non-contrast
Comparison: None.

CLINICAL DATA: Left wrist pain when the patient fell when her foot
caught on a rug while walking through her house.

EXAM:
LEFT WRIST - COMPLETE 3+ VIEW

[wrist pa]
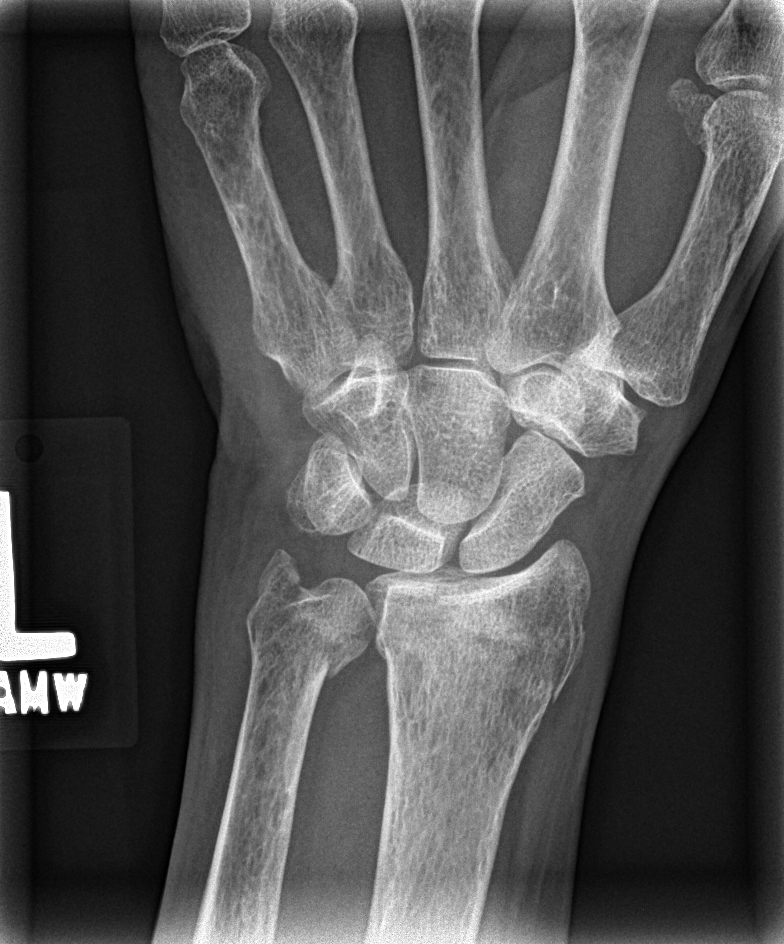

[wrist navicular]
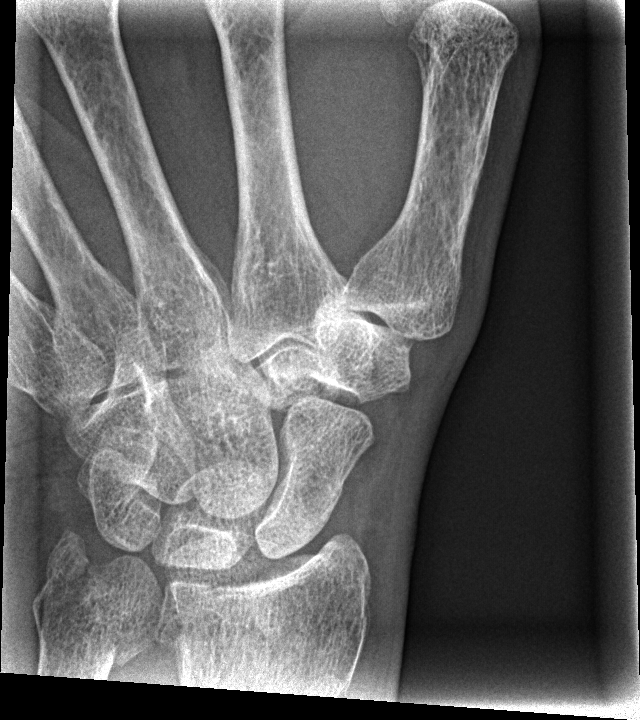

[wrist obl]
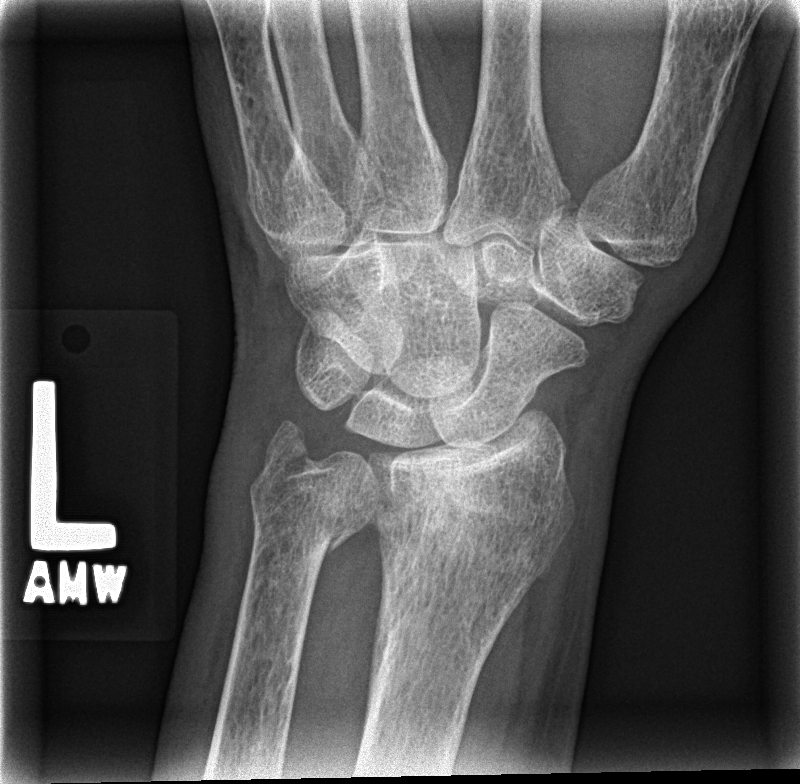

[wrist lat]
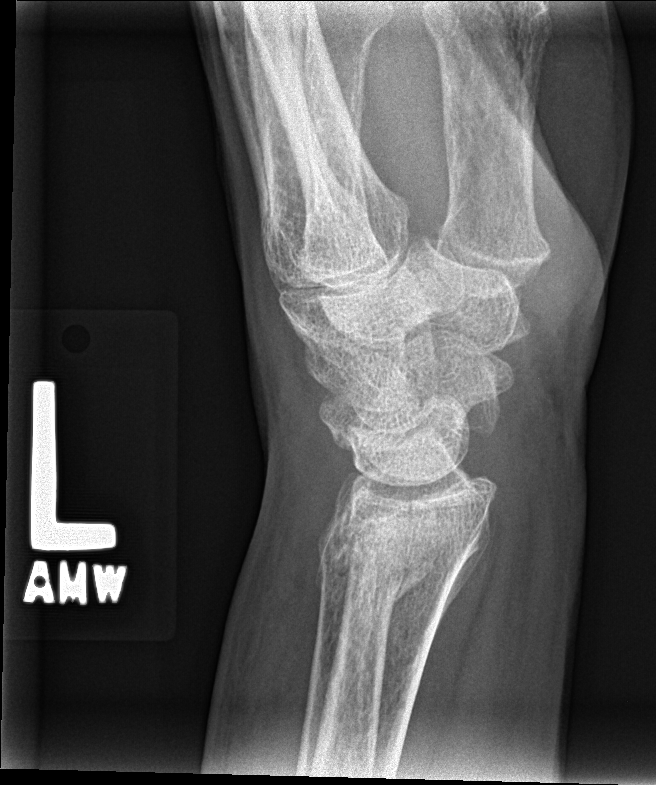

[4 of 4 positions shown; findings below may reference images not displayed]

FINDINGS: Mild comminuted fracture through the distal ulnar metaphysis and
base of the ulnar styloid with mild radial displacement and
angulation of the distal fragment. There is also an essentially
nondisplaced fracture in the distal radial metaphysis, involving the
radial styloid with possible extension into the radiocarpal joint.
No significant displacement or angulation.
IMPRESSION: Distal radius and ulna fractures, as described above.

## 2016-07-25 ENCOUNTER — Telehealth: Payer: Self-pay | Admitting: Gastroenterology

## 2016-07-25 ENCOUNTER — Ambulatory Visit (INDEPENDENT_AMBULATORY_CARE_PROVIDER_SITE_OTHER): Payer: Medicare Other | Admitting: Family Medicine

## 2016-07-25 ENCOUNTER — Encounter: Payer: Self-pay | Admitting: Family Medicine

## 2016-07-25 DIAGNOSIS — E785 Hyperlipidemia, unspecified: Secondary | ICD-10-CM

## 2016-07-25 DIAGNOSIS — N183 Chronic kidney disease, stage 3 unspecified: Secondary | ICD-10-CM

## 2016-07-25 DIAGNOSIS — E039 Hypothyroidism, unspecified: Secondary | ICD-10-CM | POA: Diagnosis not present

## 2016-07-25 DIAGNOSIS — Z794 Long term (current) use of insulin: Secondary | ICD-10-CM

## 2016-07-25 DIAGNOSIS — E1129 Type 2 diabetes mellitus with other diabetic kidney complication: Secondary | ICD-10-CM

## 2016-07-25 DIAGNOSIS — R269 Unspecified abnormalities of gait and mobility: Secondary | ICD-10-CM

## 2016-07-25 DIAGNOSIS — I10 Essential (primary) hypertension: Secondary | ICD-10-CM

## 2016-07-25 DIAGNOSIS — R634 Abnormal weight loss: Secondary | ICD-10-CM | POA: Diagnosis not present

## 2016-07-25 DIAGNOSIS — R259 Unspecified abnormal involuntary movements: Secondary | ICD-10-CM

## 2016-07-25 NOTE — Telephone Encounter (Signed)
No that's fine, she should avoid them moving forward this week but plan on her procedure as planned. thanks

## 2016-07-25 NOTE — Progress Notes (Signed)
HPI:  Follow up:  Slow gait/Fasiculations Face/?tremor: -she has noticed this recently -denies recent falls, weakness, paresthesia, fevers  Weight loss: -with anemia, she thought due to stress -referred to GI for further eval to exclude other -several imaging studies includine CT, MRI, PET scan have been done with a kidney lesions that is pending further eval with urology - reports she saw urologist and was told is likely benign or low grade and they are monitoring -reports has EGD and colonoscopy next week  R renal cyst: -found on imaging done by GI  for weight loss/anemai -seeing urology for further evaluation -see above, monitoring with urologist per pt  L radial and ulnar fx: -occured summer 2017 -seeing ortho for this -has not had bone density - assistant to help her reorder today  DM/Hypothyroidism:: -sees endocrine, has labs with endo on a regular basis -complications: kidney disease -meds: synthroid, lantus, aspar with meals, metformin, actos, arb, asa -eye exam: done - [facility] -foot exam: done  -denies: hypoglycemia, vision changes, wounds  Anxiety and stress: -intermittent stress/anxiety over the last few years - now improved -does interfere with intake and does suffer weight loss with increased stress -denies: fevers, malaise, dizziness, cough, abd pain, diarrhea, constipation, melena, hematochezia - reports last colon ca screen not complete and then had barium swallow  HTN/HLD: -sees Dr. Gwenlyn Found for this -meds: anlodipine 10, valsartan 320. hctz 25, atorvastatin 40 -denies: CP, SOB, DOE -she reports she had carotid artery scan and was told this was good   ROS: See pertinent positives and negatives per HPI.  Past Medical History:  Diagnosis Date  . Arthritis   . Cancer ([place])   . Chest pain summer 2012  . Chicken pox   . Diverticulosis    difficulty with colonoscopy 2005  . DM (diabetes mellitus), type 2 with renal complications managed by  endocrinologist, Dr. Elyse Hsu 10/08/2012  . Fracture    radial/ulnar 03/2016  . GERD (gastroesophageal reflux disease)   . High cholesterol   . Hypertension   . Hypothyroid, s/p radioactive iodine tx for graves 2001 08/24/2012  . Kidney disease   . UTI (urinary tract infection)   . Weight loss    eval with GI in 2016; s/p CT/MRI and PET scans    Past Surgical History:  Procedure Laterality Date  . ABDOMINAL HYSTERECTOMY  1989    Family History  Problem Relation Age of Onset  . Cancer Mother     liver  . Stroke Father 40  . Diabetes Father   . Hypertension Father   . Diabetes Maternal Grandmother   . Pancreatic cancer Brother   . Diabetes Sister   . Cancer Sister   . Diabetes Sister   . Colon cancer Other   . Lung cancer      grandparent  . Stroke      Social History   Social History  . Marital status: Married    Spouse name: N/A  . Number of children: N/A  . Years of education: N/A   Social History Main Topics  . Smoking status: Never Smoker  . Smokeless tobacco: Never Used  . Alcohol use No  . Drug use: No  . Sexual activity: Not on file   Other Topics Concern  . 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  AMLODIPINE BESYLATE PO, Take 10 mg by mouth daily. , Disp: , Rfl:  .  aspirin 81 MG tablet, Take 81 mg by mouth daily., Disp: , Rfl:  .  ATENOLOL PO, Take 50 mg by mouth 2 (two) times daily. , Disp: , Rfl:  .  atorvastatin (LIPITOR) 40 MG tablet, Take 40 mg by mouth daily., Disp: , Rfl:  .  b complex vitamins tablet, Take 1 tablet by mouth daily., Disp: , Rfl:  .  Biotin  (BIOTIN 5000) 5 MG CAPS, Take 5 mg by mouth daily. , Disp: , Rfl:  .  Blood Glucose Monitoring Suppl (ACCU-CHEK AVIVA) device, by Other route. Use as instructed-Accu-chek Aviva Expert, Disp: , Rfl:  .  Calcium Carbonate-Vitamin D (CALTRATE 600+D) 600-400 MG-UNIT per tablet, Take 1 tablet by mouth daily., Disp: , Rfl:  .  Cholecalciferol (VITAMIN D3) 2000 UNITS TABS, Take 1 tablet by mouth daily. , Disp: , Rfl:  .  Coenzyme Q10 (COQ10 PO), Take 1 tablet by mouth daily. , Disp: , Rfl:  .  hydrochlorothiazide (HYDRODIURIL) 25 MG tablet, Take 25 mg by mouth daily. , Disp: , Rfl:  .  Insulin Aspart (NOVOLOG [place]), Inject 8 Units into the skin as directed. And sliding scale based on readings., Disp: , Rfl:  .  insulin glargine (LANTUS) 100 UNIT/ML injection, Inject 25 Units into the skin at bedtime. , Disp: , Rfl:  .  levothyroxine (SYNTHROID, LEVOTHROID) 75 MCG tablet, Take 75 mcg by mouth daily., Disp: , Rfl:  .  METFORMIN HCL PO, Take 500 mg by mouth 2 (two) times daily. , Disp: , Rfl:  .  Multiple Vitamins-Minerals (CENTRUM SILVER PO), Take 1 tablet by mouth daily. , Disp: , Rfl:  .  Pioglitazone HCl (ACTOS PO), Take 45 mg by mouth daily. , Disp: , Rfl:  .  Valsartan (DIOVAN PO), Take 320 mg by mouth daily. , Disp: , Rfl:   EXAM:  There were no vitals filed for this visit.  There is no height or weight on file to calculate BMI.  Ht: 5 ' 5.5" Wt 142.4 T 98 P 62 BP 118/60 GENERAL: vitals reviewed and listed above, alert, oriented, appears well hydrated and in no acute distress  HEENT: atraumatic, conjunttiva clear, no obvious abnormalities on inspection of external nose and ears  NECK: no obvious masses on inspection  LUNGS: clear to auscultation bilaterally, no wheezes, rales or rhonchi, good air movement  CV: HRRR, SEM, no peripheral edema  MS: moves all extremities without noticeable abnormality, slow deliberate gait  NEURO/PSYCH: pleasant and cooperative, flat affect - more so then  in the past, slow deliberate movements, no obvious depression or anxiety, no appreciable fasciculations today, speech slow, thought processing grossly intake, CN II-XII grossly intact, finger to nose normal, seems to have some cogwheel rigidity in upper extremities  ASSESSMENT AND PLAN:  Discussed the following assessment and plan:  Hypothyroidism, unspecified type  Loss of weight - Plan: Ambulatory referral to Neurology  Essential hypertension  Hyperlipidemia, unspecified hyperlipidemia type  Type 2 diabetes mellitus with other diabetic kidney complication, with long-term current use of insulin (HCC)  CKD (chronic kidney disease) stage 3, GFR  30-59 ml/min  Gait disturbance - Plan: Ambulatory referral to Neurology  Parkinsonian features - Plan: Ambulatory referral to Neurology   -foot exam - she reports she does with endo, we did repeat today -flu shot offered -referral to neurology - query parkinsons or other -cont eval with GI/urologist -medicare exam and labs in 3 months -computers down during visit so no AVS printed -Patient advised to return or notify a doctor immediately if symptoms worsen or persist or new concerns arise.  There are no Patient Instructions on file for this visit.  Colin Benton R., DO

## 2016-07-25 NOTE — Telephone Encounter (Signed)
Patient called wanted you to be aware that she is being referred to a Neurologist (gait, ?parkinsons). She also admitted to eating corn and peanuts on Saturday, colonoscopy is scheduled for Thursday, 10/26, she is asking if eating that will be a problem? Please advise.

## 2016-07-25 NOTE — Addendum Note (Signed)
Addended by: Agnes Lawrence on: 07/25/2016 04:37 PM   Modules accepted: Orders

## 2016-07-28 ENCOUNTER — Ambulatory Visit (AMBULATORY_SURGERY_CENTER): Payer: Medicare Other | Admitting: Gastroenterology

## 2016-07-28 ENCOUNTER — Encounter: Payer: Self-pay | Admitting: Gastroenterology

## 2016-07-28 VITALS — BP 132/60 | HR 60 | Temp 95.9°F | Resp 12 | Ht 67.0 in | Wt 145.0 lb

## 2016-07-28 DIAGNOSIS — K2970 Gastritis, unspecified, without bleeding: Secondary | ICD-10-CM

## 2016-07-28 DIAGNOSIS — R634 Abnormal weight loss: Secondary | ICD-10-CM

## 2016-07-28 DIAGNOSIS — K2990 Gastroduodenitis, unspecified, without bleeding: Secondary | ICD-10-CM | POA: Diagnosis not present

## 2016-07-28 DIAGNOSIS — D508 Other iron deficiency anemias: Secondary | ICD-10-CM

## 2016-07-28 DIAGNOSIS — D122 Benign neoplasm of ascending colon: Secondary | ICD-10-CM | POA: Diagnosis not present

## 2016-07-28 DIAGNOSIS — K2950 Unspecified chronic gastritis without bleeding: Secondary | ICD-10-CM | POA: Diagnosis not present

## 2016-07-28 DIAGNOSIS — K3189 Other diseases of stomach and duodenum: Secondary | ICD-10-CM | POA: Diagnosis not present

## 2016-07-28 HISTORY — PX: COLONOSCOPY: SHX174

## 2016-07-28 HISTORY — PX: UPPER GI ENDOSCOPY: SHX6162

## 2016-07-28 LAB — GLUCOSE, CAPILLARY
GLUCOSE-CAPILLARY: 99 mg/dL (ref 65–99)
Glucose-Capillary: 86 mg/dL (ref 65–99)

## 2016-07-28 MED ORDER — SODIUM CHLORIDE 0.9 % IV SOLN: 500.0000 mL | INTRAVENOUS | Status: DC

## 2016-07-28 NOTE — Progress Notes (Signed)
To PACU, vss patent aw report to rn 

## 2016-07-28 NOTE — Patient Instructions (Signed)
YOU HAD AN ENDOSCOPIC PROCEDURE TODAY AT [facility]:   Refer to the procedure report that was given to you for any specific questions about what was found during the examination.  If the procedure report does not answer your questions, please call your gastroenterologist to clarify.  If you requested that your care partner not be given the details of your procedure findings, then the procedure report has been included in a sealed envelope for you to review at your convenience later.  YOU SHOULD EXPECT: Some feelings of bloating in the abdomen. Passage of more gas than usual.  Walking can help get rid of the air that was put into your GI tract during the procedure and reduce the bloating. If you had a lower endoscopy (such as a colonoscopy or flexible sigmoidoscopy) you may notice spotting of blood in your stool or on the toilet paper. If you underwent a bowel prep for your procedure, you may not have a normal bowel movement for a few days.  Please Note:  You might notice some irritation and congestion in your nose or some drainage.  This is from the oxygen used during your procedure.  There is no need for concern and it should clear up in a day or so.  SYMPTOMS TO REPORT IMMEDIATELY:   Following lower endoscopy (colonoscopy or flexible sigmoidoscopy):  Excessive amounts of blood in the stool  Significant tenderness or worsening of abdominal pains  Swelling of the abdomen that is new, acute  Fever of 100F or higher   Following upper endoscopy (EGD)  Vomiting of blood or coffee ground material  New chest pain or pain under the shoulder blades  Painful or persistently difficult swallowing  New shortness of breath  Fever of 100F or higher  Black, tarry-looking stools  For urgent or emergent issues, a gastroenterologist can be reached at any hour by calling [phone].  No NSAIDS,ibuprofen,motrin for 2 weeks.   DIET:  We do recommend a small meal at first, but then you  may proceed to your regular diet.  Drink plenty of fluids but you should avoid alcoholic beverages for 24 hours.  ACTIVITY:  You should plan to take it easy for the rest of today and you should NOT DRIVE or use heavy machinery until tomorrow (because of the sedation medicines used during the test).    FOLLOW UP: Our staff will call the number listed on your records the next business day following your procedure to check on you and address any questions or concerns that you may have regarding the information given to you following your procedure. If we do not reach you, we will leave a message.  However, if you are feeling well and you are not experiencing any problems, there is no need to return our call.  We will assume that you have returned to your regular daily activities without incident.  If any biopsies were taken you will be contacted by phone or by letter within the next 1-3 weeks.  Please call us at [phone] if you have not heard about the biopsies in 3 weeks.    SIGNATURES/CONFIDENTIALITY: You and/or your care partner have signed paperwork which will be entered into your electronic medical record.  These signatures attest to the fact that that the information above on your After Visit Summary has been reviewed and is understood.  Full responsibility of the confidentiality of this discharge information lies with you and/or your care-partner.  Thank you for letting us  take care of your healthcare needs today.

## 2016-07-28 NOTE — Op Note (Signed)
[place] Patient Name: Pamela Patton Procedure Date: 07/28/2016 3:16 PM MRN: [id number] Endoscopist: Remo Lipps P. Armelia Penton MD, MD Age: 70 Referring MD:  Date of Birth: Jun 16, 1946 Gender: Female Account #: [id number] Procedure:                Colonoscopy Indications:              Screening for malignant neoplasm in the colon Medicines:                Monitored Anesthesia Care Procedure:                Pre-Anesthesia Assessment:                           - Prior to the procedure, a History and Physical                            was performed, and patient medications and                            allergies were reviewed. The patient's tolerance of                            previous anesthesia was also reviewed. The risks                            and benefits of the procedure and the sedation                            options and risks were discussed with the patient.                            All questions were answered, and informed consent                            was obtained. Prior Anticoagulants: The patient has                            taken aspirin, last dose was 1 day prior to                            procedure. ASA Grade Assessment: III - A patient                            with severe systemic disease. After reviewing the                            risks and benefits, the patient was deemed in                            satisfactory condition to undergo the procedure.                           After obtaining informed consent, the colonoscope  was passed under direct vision. Throughout the                            procedure, the patient's blood pressure, pulse, and                            oxygen saturations were monitored continuously. The                            Model PCF-H190DL [id number] [id number]) scope was introduced                            through the anus and advanced to the the cecum,   identified by appendiceal orifice and ileocecal                            valve. The colonoscopy was technically difficult                            and complex due to a tortuous colon. The patient                            tolerated the procedure well. The quality of the                            bowel preparation was good. The ileocecal valve,                            appendiceal orifice, and rectum were photographed. Scope In: 3:45:26 PM Scope Out: 4:08:15 PM Scope Withdrawal Time: 0 hours 17 minutes 50 seconds  Total Procedure Duration: 0 hours 22 minutes 49 seconds  Findings:                 The perianal and digital rectal examinations were                            normal.                           A 5 mm polyp was found in the ascending colon. The                            polyp was sessile. The polyp was removed with a                            cold snare. Resection and retrieval were complete.                           A 5 mm polyp was found in the hepatic flexure. The                            polyp was sessile. The polyp was removed with a  cold snare. Resection and retrieval were complete.                           Many medium-mouthed diverticula were found in the                            left colon.                           The exam was otherwise without abnormality. The                            colon was extremely tortous. Complications:            No immediate complications. Estimated blood loss:                            Minimal. Estimated Blood Loss:     Estimated blood loss was minimal. Impression:               - One 5 mm polyp in the ascending colon, removed                            with a cold snare. Resected and retrieved.                           - One 5 mm polyp at the hepatic flexure, removed                            with a cold snare. Resected and retrieved.                           - Diverticulosis in the left  colon.                           - The examination was otherwise normal. Recommendation:           - Patient has a contact number available for                            emergencies. The signs and symptoms of potential                            delayed complications were discussed with the                            patient. Return to normal activities tomorrow.                            Written discharge instructions were provided to the                            patient.                           - Resume previous diet.                           -  Continue present medications.                           - No ibuprofen, naproxen, or other non-steroidal                            anti-inflammatory drugs for 2 weeks after polyp                            removal.                           - Await pathology results.                           - Repeat colonoscopy is recommended for                            surveillance. The colonoscopy date will be                            determined after pathology results from today's                            exam become available for review. Remo Lipps P. Lindy Pennisi MD, MD 07/28/2016 4:17:19 PM This report has been signed electronically.

## 2016-07-28 NOTE — Progress Notes (Signed)
Called to room to assist during endoscopic procedure.  Patient ID and intended procedure confirmed with present staff. Received instructions for my participation in the procedure from the performing physician.  

## 2016-07-28 NOTE — Op Note (Signed)
[place] Patient Name: Pamela Patton Procedure Date: 07/28/2016 3:17 PM MRN: [id number] Endoscopist: Remo Lipps P. Armbruster MD, MD Age: 70 Referring MD:  Date of Birth: 1946/01/20 Gender: Female Account #: [id number] Procedure:                Upper GI endoscopy Indications:              Weight loss, unclear etiology Medicines:                Monitored Anesthesia Care Procedure:                Pre-Anesthesia Assessment:                           - Prior to the procedure, a History and Physical                            was performed, and patient medications and                            allergies were reviewed. The patient's tolerance of                            previous anesthesia was also reviewed. The risks                            and benefits of the procedure and the sedation                            options and risks were discussed with the patient.                            All questions were answered, and informed consent                            was obtained. Prior Anticoagulants: The patient has                            taken aspirin, last dose was 1 day prior to                            procedure. ASA Grade Assessment: III - A patient                            with severe systemic disease. After reviewing the                            risks and benefits, the patient was deemed in                            satisfactory condition to undergo the procedure.                           After obtaining informed consent, the endoscope was  passed under direct vision. Throughout the                            procedure, the patient's blood pressure, pulse, and                            oxygen saturations were monitored continuously. The                            Model GIF-[id number]) scope was introduced                            through the mouth, and advanced to the second part                            of duodenum.  The upper GI endoscopy was                            accomplished without difficulty. The patient                            tolerated the procedure well. Scope In: Scope Out: Findings:                 Esophagogastric landmarks were identified: the                            Z-line was found at 36 cm, the gastroesophageal                            junction was found at 36 cm and the upper extent of                            the gastric folds was found at 36 cm from the                            incisors.                           The exam of the esophagus was otherwise normal.                           A single 4 mm sessile polyp was found at the                            pylorus. The polyp was removed with a cold biopsy                            forceps. Resection and retrieval were complete.                           A single 4 mm sessile polyp was found in the  gastric body. The polyp was removed with a cold                            biopsy forceps. Resection and retrieval were                            complete.                           The exam of the stomach was otherwise normal.                           Biopsies were taken with a cold forceps in the                            gastric body and in the gastric antrum for                            Helicobacter pylori testing.                           The duodenal bulb and second portion of the                            duodenum were normal. Complications:            No immediate complications. Estimated blood loss:                            Minimal. Estimated Blood Loss:     Estimated blood loss was minimal. Impression:               - Esophagogastric landmarks identified.                           - Normal esophagus otherwise                           - A single gastric polyp. Resected and retrieved.                           - A single gastric polyp. Resected and retrieved.                            - Normal duodenal bulb and second portion of the                            duodenum.                           - Biopsies were taken with a cold forceps for                            Helicobacter pylori testing. Recommendation:           - Patient has a contact number available for  emergencies. The signs and symptoms of potential                            delayed complications were discussed with the                            patient. Return to normal activities tomorrow.                            Written discharge instructions were provided to the                            patient.                           - Resume previous diet.                           - Continue present medications.                           - Await pathology results. Remo Lipps P. Armbruster MD, MD 07/28/2016 4:13:43 PM This report has been signed electronically.

## 2016-07-29 ENCOUNTER — Telehealth: Payer: Self-pay

## 2016-07-29 NOTE — Telephone Encounter (Signed)
2 Follow up Call-  Call back number 07/28/2016  Post procedure Call Back phone  # [phone]  Permission to leave phone message Yes  Some recent data might be hidden     Patient questions:  Do you have a fever, pain , or abdominal swelling? No. Pain Score  0 *  Have you tolerated food without any problems? Yes.    Have you been able to return to your normal activities? Yes.    Do you have any questions about your discharge instructions: Diet   No. Medications  No. Follow up visit  No.  Do you have questions or concerns about your Care? No.  Actions: * If pain score is 4 or above: No action needed, pain <4.

## 2016-08-05 NOTE — Progress Notes (Signed)
Pamela Patton was seen today in the movement disorders clinic for neurologic consultation at the request of Lucretia Kern., DO.  The consultation is for the evaluation of gait change, cogwheel rigidity and to r/o PD.  The records that were made available to me were reviewed.  Pt reports that she has had unsteadiness at least since June.  She fell then and broke her radius/ulna.  States that she turned around at the sink and thinks that she tripped over her feet and "raggady bottom shoes"  Specific Symptoms:  Tremor: Yes.  , "my lower jaw" since at least summer, 2017 Family hx of similar:  No. Voice: yes, more hoarse but thinks that strength of voice is the same Sleep: sleeps well  Vivid Dreams:  May or may not be  Acting out dreams:  Occasionally (lives by her self but knows that one time recently she woke up yelling) Wet Pillows: Yes.   Postural symptoms:  Yes.    Falls?  Yes.  , 1 time in summer and broke arm; no other falls Bradykinesia symptoms: drooling while awake, difficulty getting out of a chair and "I walk like frankenstein."  States that she no longer has a "smooth gait" but it is stiff. Loss of smell:  No. Loss of taste:  No. Urinary Incontinence:  No. but urinary urgency Difficulty Swallowing:  No. (some large pills that she has trouble with) Handwriting, micrographia: No. Trouble with ADL's:  Yes.   (trouble getting clothes off over head - "I have dexterity issues")  Trouble buttoning clothing: Yes.   Depression:  Occasionally, but more frustrated with inability to get clothes up and down; inability to use knife like she could (due to breaking arm) Memory changes:  May or may not Hallucinations:  No.  visual distortions: Yes.   N/V:  No. Lightheaded:  No.  Syncope: No. Diplopia:  Yes.  , usually will go away if adjusts glasses.  Not sure if happens if glasses off.  Trouble saying if monocular or binocular Dyskinesia:  No.  Neuroimaging has not previously been  performed.    ALLERGIES:  No Known Allergies  CURRENT MEDICATIONS:  Outpatient Encounter Prescriptions as of 08/08/2016  Medication Sig  . AMLODIPINE BESYLATE PO Take 10 mg by mouth daily.   Marland Kitchen aspirin 81 MG tablet Take 81 mg by mouth daily.  . ATENOLOL PO Take 50 mg by mouth 2 (two) times daily.   Marland Kitchen atorvastatin (LIPITOR) 40 MG tablet Take 40 mg by mouth daily.  . Calcium Carbonate-Vitamin D (CALTRATE 600+D) 600-400 MG-UNIT per tablet Take 1 tablet by mouth daily.  Marland Kitchen HUMALOG KWIKPEN 100 UNIT/ML KiwkPen   . hydrochlorothiazide (HYDRODIURIL) 25 MG tablet Take 25 mg by mouth daily.   . insulin glargine (LANTUS) 100 UNIT/ML injection Inject 25 Units into the skin at bedtime.   Marland Kitchen levothyroxine (SYNTHROID, LEVOTHROID) 75 MCG tablet Take 75 mcg by mouth daily.  Marland Kitchen METFORMIN HCL PO Take 500 mg by mouth 2 (two) times daily.   . Pioglitazone HCl (ACTOS PO) Take 45 mg by mouth daily.   . Valsartan (DIOVAN PO) Take 320 mg by mouth daily.   . [DISCONTINUED] b complex vitamins tablet Take 1 tablet by mouth daily.  . [DISCONTINUED] Biotin (BIOTIN 5000) 5 MG CAPS Take 5 mg by mouth daily.   . [DISCONTINUED] Blood Glucose Monitoring Suppl (ACCU-CHEK AVIVA) device by Other route. Use as instructed-Accu-chek Environmental health practitioner  . [DISCONTINUED] Cholecalciferol (VITAMIN D3) 2000 UNITS TABS Take  1 tablet by mouth daily.   . [DISCONTINUED] Coenzyme Q10 (COQ10 PO) Take 1 tablet by mouth daily.   . [DISCONTINUED] Insulin Aspart (NOVOLOG [place]) Inject 8 Units into the skin as directed. And sliding scale based on readings.  . [DISCONTINUED] Multiple Vitamins-Minerals (CENTRUM SILVER PO) Take 1 tablet by mouth daily.    Facility-Administered Encounter Medications as of 08/08/2016  Medication  . 0.9 %  sodium chloride infusion    PAST MEDICAL HISTORY:   Past Medical History:  Diagnosis Date  . Anemia   . Arthritis   . Cancer [facility])    fibroid tumor  . Cataract    immature   . Chest pain summer 2012  . Chicken pox    . Diverticulosis    difficulty with colonoscopy 2005  . 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 Elyse Hsu 10/08/2012  . Fracture    radial/ulnar 03/2016  . GERD (gastroesophageal reflux disease)   . Heart murmur    innocent murmur  . High cholesterol   . Hypertension   . Hypothyroid, s/p radioactive iodine tx for graves 2001 08/24/2012  . Kidney disease   . UTI (urinary tract infection)   . Weight loss    eval with GI in 2016; s/p CT/MRI and PET scans    PAST SURGICAL HISTORY:   Past Surgical History:  Procedure Laterality Date  . ABDOMINAL HYSTERECTOMY  1989  . COLONOSCOPY    . DG BARIUM SWALLOW ([place] [facility])     9 yrs ago per pt due to incomplete colonsocopy    SOCIAL HISTORY:   Social History   Social History  . Marital status: Married    Spouse name: N/A  . Number of children: N/A  . Years of education: N/A   Occupational History  . retired    Social History Main Topics  . Smoking status: Never Smoker  . Smokeless tobacco: Never Used  . Alcohol use No  . Drug use: No  . Sexual activity: Not on file   Other Topics Concern  . 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 Mother Deceased  . Father Deceased  . Maternal Grandmother Deceased  . Maternal Grandfather Deceased  . Paternal Grandmother Deceased  . Paternal Grandfather Deceased  . Brother Deceased  . Sister Alive  . Sister Deceased  . Sister Alive  . Neg Hx     ROS:  A complete 10 system review of systems was obtained and was unremarkable apart from what is  mentioned above.  PHYSICAL EXAMINATION:    VITALS:   Vitals:   08/08/16 1014  BP: 122/70  Pulse: 68  Weight: 142 lb (64.4 kg)  Height: 5\' 7"  (1.702 m)   Wt Readings from Last 3 Encounters:  08/08/16 142 lb (64.4 kg)  07/28/16 145 lb (65.8 kg)  06/03/16 145 lb (65.8 kg)     GEN:  The patient appears stated age and is in NAD. HEENT:  Normocephalic, atraumatic.  The mucous membranes are moist. The superficial temporal arteries are without ropiness or tenderness. CV:  RRR Lungs:  CTAB Neck/HEME:  There are no carotid bruits bilaterally.  Neurological examination:  Orientation: The patient is alert and oriented x3. Fund of knowledge is appropriate.  Recent and remote memory are intact.  Attention and concentration are normal.    Able to name objects and repeat phrases. Cranial nerves: There is good facial symmetry. There is facial hypomimia.  Pupils are equal round and reactive to light bilaterally. Fundoscopic exam reveals clear margins bilaterally. Extraocular muscles are intact. The visual fields are full to confrontational testing. The speech is fluent and clear. Speech is pseudobulbar in quality.  Soft palate rises symmetrically and there is no tongue deviation. Hearing is intact to conversational tone. Sensation: Sensation is intact to light and pinprick throughout (facial, trunk, extremities). Vibration is intact at the bilateral big toe. There is no extinction with double simultaneous stimulation. There is no sensory dermatomal level identified. Motor: Strength is 5/5 in the bilateral upper and lower extremities with the exception of abduction of fingers on the left and strength there is 4/5.   Shoulder shrug is equal and symmetric.  There is no pronator drift. Deep tendon reflexes: Deep tendon reflexes are 2/4 at the bilateral biceps, triceps, brachioradialis, absent at the bilateral patella and achilles. Plantar responses are downgoing bilaterally.  Movement examination: Tone:  There is moderate increased tone in the LUE and mild increased tone in the RUE.  Tone in the LE is good bilaterally. Abnormal movements: There is jaw tremor; There is intermittent L leg tremor.   Coordination:  There is decremation with RAM's, with hand opening and closing, finger taps, alternation of supination/pronation of the forearm on the left.  There is decremation with finger taps on the right.  All other forms of rapid alternating movements on the right were normal.  All forms of rapid alternating movements in the lower extremities bilaterally were normal. Gait and Station: The patient has no significant difficulty arising out of a deep-seated chair without the use of the hands. The patient's stride length is normal with decreased arm swing bilaterally.   LABS    Chemistry      Component Value Date/Time   NA 145 06/03/2016 1444   K 4.0 06/03/2016 1444   CL 106 06/03/2016 1444   CO2 31 06/03/2016 1444   BUN 23 06/03/2016 1444   CREATININE 1.33 (H) 06/03/2016 1444      Component Value Date/Time   CALCIUM 9.8 06/03/2016 1444   ALKPHOS 58 06/03/2016 1444   AST 13 06/03/2016 1444   ALT 10 06/03/2016 1444   BILITOT 0.6 06/03/2016 1444     Lab Results  Component Value Date   TSH 0.58 04/02/2015   No results found for: VITAMINB12   ASSESSMENT/PLAN:  1.  Parkinsonism.  I suspect that this does represent idiopathic Parkinson's disease but I do think that an atypical state like PSP is in the ddx given diplopia and pseudobulbar speech.  However, tremor is somewhat atypical in PSP.  I discussed with her that time will help [place] differentiate the 2.  In addition, I told her that treatments were the same, although I told her that prognosis was very different.  -Greater than 50% of the 60 minute visit was spent in counseling answering questions and talking about what to expect now as well as in the future.  We talked about medication options as well as potential  future surgical options.  We  talked about safety in the home.  -We decided to add carbidopa/levodopa 25/100.  1/2 tab tid x 1 wk, then 1/2 in am & noon & 1 at night for a week, then 1/2 in am &1 at noon &night for a week, then 1 po tid.  Risks, benefits, side effects and alternative therapies were discussed.  The opportunity to ask questions was given and they were answered to the best of my ability.  The patient expressed understanding and willingness to follow the outlined treatment protocols.  -I will refer the patient to the Parkinson's program at the [facility], for PT/OT and ST.  We talked about the importance of safe, cardiovascular exercise in Parkinson's disease.  -We discussed community resources in the area including patient support groups and community exercise programs for PD and pt education was provided to the patient.   2.  Weight loss  -Likely due to the fact that it is taking her so long to eat, which is common in Parkinson's disease.  I encouraged her to start Glucerna.  She really is not describing dysphagia, but if not improved with Glucerna, we will look at a modified barium swallow.  3.  I will plan on seeing her back in the next 8-10 weeks, sooner should new neurologic issues arise.   Cc:  Lucretia Kern., DO

## 2016-08-08 ENCOUNTER — Ambulatory Visit (INDEPENDENT_AMBULATORY_CARE_PROVIDER_SITE_OTHER): Payer: Medicare Other | Admitting: Neurology

## 2016-08-08 ENCOUNTER — Encounter: Payer: Self-pay | Admitting: Neurology

## 2016-08-08 VITALS — BP 122/70 | HR 68 | Ht 67.0 in | Wt 142.0 lb

## 2016-08-08 DIAGNOSIS — G20A1 Parkinson's disease without dyskinesia, without mention of fluctuations: Secondary | ICD-10-CM | POA: Insufficient documentation

## 2016-08-08 DIAGNOSIS — R634 Abnormal weight loss: Secondary | ICD-10-CM

## 2016-08-08 DIAGNOSIS — G709 Myoneural disorder, unspecified: Secondary | ICD-10-CM

## 2016-08-08 DIAGNOSIS — G20 Parkinson's disease: Secondary | ICD-10-CM

## 2016-08-08 HISTORY — DX: Myoneural disorder, unspecified: G70.9

## 2016-08-08 HISTORY — DX: Parkinson's disease: G20

## 2016-08-08 MED ORDER — CARBIDOPA-LEVODOPA 25-100 MG PO TABS: 1.0000 | ORAL_TABLET | Freq: Three times a day (TID) | ORAL | 2 refills | Status: DC

## 2016-08-08 NOTE — Patient Instructions (Signed)
1. Start Carbidopa Levodopa as follows:  Take 1/2 tablet three times daily, at least 30 minutes before meals, for one week  Then take 1/2 tablet in the morning, 1/2 tablet in the afternoon, 1 tablet in the evening, at least 30 minutes before meals, for one week  Then take 1/2 tablet in the morning, 1 tablet in the afternoon, 1 tablet in the evening, at least 30 minutes before meals, for one week  Then take 1 tablet three times daily, at least 30 minutes before meals  2. You have been referred to [facility] for physical, occupational and speech therapy. They will call you directly to schedule an appointment.  Please call [phone] if you do not hear from them.

## 2016-08-17 ENCOUNTER — Ambulatory Visit: Payer: Medicare Other | Attending: Neurology | Admitting: Occupational Therapy

## 2016-08-17 DIAGNOSIS — R293 Abnormal posture: Secondary | ICD-10-CM | POA: Insufficient documentation

## 2016-08-17 DIAGNOSIS — R29818 Other symptoms and signs involving the nervous system: Secondary | ICD-10-CM | POA: Insufficient documentation

## 2016-08-17 DIAGNOSIS — R2681 Unsteadiness on feet: Secondary | ICD-10-CM

## 2016-08-17 DIAGNOSIS — R29898 Other symptoms and signs involving the musculoskeletal system: Secondary | ICD-10-CM | POA: Insufficient documentation

## 2016-08-17 DIAGNOSIS — R2689 Other abnormalities of gait and mobility: Secondary | ICD-10-CM | POA: Diagnosis present

## 2016-08-17 DIAGNOSIS — R278 Other lack of coordination: Secondary | ICD-10-CM | POA: Insufficient documentation

## 2016-08-17 NOTE — Therapy (Signed)
[facility] [address] [place], [place], [address] Phone: [phone]   Fax:  [phone]  Occupational Therapy Evaluation  Patient Details  Name: Pamela Patton MRN: [id number] Date of Birth: Apr 06, 1946 Referring Provider: Dr Carles Collet  Encounter Date: 08/17/2016      OT End of Session - 08/17/16 1534    Visit Number 1   Number of Visits 17   Date for OT Re-Evaluation 10/15/16   Authorization Type UHC MCR   OT Start Time 1309   OT Stop Time 1400   OT Time Calculation (min) 51 min   Activity Tolerance Patient tolerated treatment well   Behavior During Therapy [facility] for tasks assessed/performed      Past Medical History:  Diagnosis Date  . Anemia   . Arthritis   . Cancer [facility])    fibroid tumor  . Cataract    immature   . Chest pain summer 2012  . Chicken pox   . Diverticulosis    difficulty with colonoscopy 2005  . 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 Elyse Hsu 10/08/2012  . Fracture    radial/ulnar 03/2016  . GERD (gastroesophageal reflux disease)   . Heart murmur    innocent murmur  . High cholesterol   . Hypertension   . Hypothyroid, s/p radioactive iodine tx for graves 2001 08/24/2012  . Kidney disease   . UTI (urinary tract infection)   . Weight loss    eval with GI in 2016; s/p CT/MRI and PET scans    Past Surgical History:  Procedure Laterality Date  . ABDOMINAL HYSTERECTOMY  1989  . COLONOSCOPY    . DG BARIUM SWALLOW ([place] [facility]X)     9 yrs ago per pt due to incomplete colonsocopy    There were no vitals filed for this visit.      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 No/denies           [facility] OT Assessment - 08/17/16 1314      Assessment   Diagnosis Parkinsonism   Referring Provider Dr [name]   Onset Date 08/09/16   Prior Therapy PT     Precautions   Precautions Fall     Balance Screen   Has the patient fallen in the past 6 months Yes   How many times? 1   Has the patient had a decrease in activity level because of a fear of falling?  No   Is the patient reluctant to leave their home because of a fear of falling?  No     Home  Environment   Family/patient expects to be discharged to: --  [facility]   Alternate Level Stairs - Number of Steps --  3 levels   Lives With Alone     Prior Function   Level of Independence Independent   Vocation Retired   Biomedical scientist retired professor at [employer] singing in choir, exercise     ADL   Eating/Feeding Modified independent  difficulty cutting food, increased spills   Grooming Modified independent  difficulty combing and styling hair   Upper Body Bathing Modified independent   Lower Body Bathing Modified independent  bathtub and shower   Upper Body Dressing Increased time;Independent  difficulty pulling on shirt  Lower Body Dressing Modified independent;Increased time  difficulty pulling pants up/ down   Tax adviser Independent   ADL comments difficulty opening plastic bags, unscrewing bottle tops     IADL   Shopping Takes care of all shopping needs independently   Meal Prep Able to complete simple warm meal prep     Mobility   Mobility Status Independent     Written Expression   Dominant Hand Left     Vision - History   Baseline Vision Wears glasses only for reading  wears separate glasses for distance   Patient Visual Report --  Diplopia     Vision Assessment   Vision Assessment Vision not tested     Cognition   Overall Cognitive Status Cognition to be further assessed in functional context PRN     Observation/Other Assessments   Other Surveys  Select   Physical Performance Test   Yes   Simulated Eating Time (seconds)  19.62 secs   Donning Doffing Jacket Time (seconds)  1 min, 20 secs   Donning Doffing Jacket Comments 3 button/ unbutton: 2 mins,4 secs     Coordination   Fine Motor Movements are Fluid and Coordinated No   9 Hole Peg Test Right;Left   Right 9 Hole Peg Test 48.32 secs   Left 9 Hole Peg Test 80.81 secs   Box and Blocks RUE 36 blocks, LUE 33 blocks   Coordination impaired LUE greater than RUE     Tone   Assessment Location Right Upper Extremity;Left Upper Extremity     ROM / Strength   AROM / PROM / Strength AROM     AROM   Overall AROM  Deficits   Overall AROM Comments RUE soulder flexion 130, LUE 115, elbow ext RUE -30, LUE -28, left wrist flexion/ extension: 53/45, finger extension grossly 95% for LUE     RUE Tone   RUE Tone Mild  rigidity     LUE Tone   LUE Tone Moderate  rigidity                           OT Short Term Goals - 08/17/16 1505      OT SHORT TERM GOAL #1   Title I with PD specific HEP.   Time 4   Period Weeks   Status New     OT SHORT TERM GOAL #2   Title Pt will verbalize understanding of adapted strategies for ADLs/ IADLs.   Time 4   Period Weeks   Status New     OT SHORT TERM GOAL #3   Title Pt will improve LUE fine motor coordiantion as evidenced by performing 9 hole peg test in 65 secs or less.   Baseline LUE 80.81 secs, RUE 48.32 secs   Time 4   Period Weeks   Status New     OT SHORT TERM GOAL #4   Title Pt will demonstrate improved ease with dressing as evidenced by performing PPT#4 (donning/ doffing jacket ) in 60 secs or less.   Time 4   Period Weeks   Status New     OT SHORT TERM GOAL #5   Title Pt will demonstrate improved RUE fine motor coordination as evidenced by decreasing 9 hole peg test score to 43 secs or less.   Time 4   Period Weeks   Status New     OT SHORT TERM GOAL #6  Title assess handwriting and set goal PRN.   Time 4   Period Weeks   Status New           OT Long Term Goals -  08/17/16 1512      OT LONG TERM GOAL #1   Title Pt will verbalize understanding of ways to prevent future PD related complications and verbalize understanding of community resources.   Time 8   Period Weeks   Status New     OT LONG TERM GOAL #2   Title Pt will demonstrate ability to retrieve a lightweight object at 120 shoulder flexion with -20 elbow ext for LUE.   Baseline 115, -28   Time 8   Period Weeks   Status New     OT LONG TERM GOAL #3   Title Pt will demosntrate ability to retrieve an item at 130 shoulder flexion and -20 elbow ext for RUE.   Baseline 130, -30   Time 8   Period Weeks   Status New     OT LONG TERM GOAL #4   Title Pt will decrease 3 button / unbutton test to 90 secs or less.   Baseline 2 mins, 4 secs   Time 8   Period Weeks   Status New     OT LONG TERM GOAL #5   Title Pt will decrease PPT#2 (simulated feeding) to 16 secs or less.   Time 8   Period Weeks   Status New     OT LONG TERM GOAL #6   Title Pt will verbalize understanding of ways to keep thinking skills sharp   Time 8   Period Weeks   Status New               Plan - 08/17/16 1521    Clinical Impression Statement Pt is a 70 yo female with PMH significant for DM, HTN, right radial/ ulnar fx in June 2017 who was diagnosed with Parkinsonism by Dr. Carles Collet 08/09/16. Pt presents with bradykinesia, rigidity, decreased coordination, visual changes, decreased balance. Pt can benefit from skilled occupational therapy to maximize safety and indepdnence with ADLs/ IADLS and to maintain quality of life.    Rehab Potential Good   OT Frequency 2x / week  plus eval   OT Duration 8 weeks   OT Treatment/Interventions Self-care/ADL training;Therapeutic exercise;Cognitive remediation/compensation;Visual/perceptual remediation/compensation;Splinting;Neuromuscular education;Moist Heat;Parrafin;Fluidtherapy;Energy conservation;Therapeutic exercises;Patient/family education;Balance training;Therapeutic  activities;Passive range of motion;Manual Therapy;DME and/or AE instruction;Contrast Bath;Ultrasound;Cryotherapy   Plan initiate PWR! seated, coordination HEP   Consulted and Agree with Plan of Care Patient      Patient will benefit from skilled therapeutic intervention in order to improve the following deficits and impairments:  Abnormal gait, Decreased cognition, Impaired vision/preception, Impaired flexibility, Decreased knowledge of use of DME, Decreased coordination, Decreased mobility, Impaired tone, Decreased strength, Decreased range of motion, Decreased endurance, Decreased activity tolerance, Decreased balance, Decreased knowledge of precautions, Difficulty walking, Impaired perceived functional ability, Impaired UE functional use  Visit Diagnosis: Other lack of coordination - Plan: Ot plan of care cert/re-cert  Other symptoms and signs involving the musculoskeletal system - Plan: Ot plan of care cert/re-cert  Other symptoms and signs involving the nervous system - Plan: Ot plan of care cert/re-cert  Unsteadiness on feet - Plan: Ot plan of care cert/re-cert      G-Codes - [id number]    Functional Assessment Tool Used 9 hole peg test RUE 48.32 secs, LUE 80.81 secs, PPT# 4:1 min 20 secs   Functional Limitation Self  care   Self Care Current Status [phone]) At least 40 percent but less than 60 percent impaired, limited or restricted   Self Care Goal Status [id number]) At least 20 percent but less than 40 percent impaired, limited or restricted      Problem List Patient Active Problem List   Diagnosis Date Noted  . PD (Parkinson's disease) ([place]) 08/08/2016  . Loss of weight 07/25/2016  . 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 Elyse Hsu 10/08/2012  . CKD (chronic kidney disease) stage 3, GFR 30-59 ml/min 10/08/2012  . Hypertension 08/24/2012  . Hypothyroid, s/p radioactive iodine tx for graves 2001 - managed by endocrinologist, Dr.  Elyse Hsu 08/24/2012  . Hyperlipemia 08/24/2012  . Vitamin D deficiency 08/24/2012    RINE,KATHRYN 08/17/2016, 3:35 PM  [facility] [address] [place], [place], [address] Phone: [phone]   Fax:  [phone]  Name: VANESIA WORKING MRN: [id number] Date of Birth: 08/10/1946

## 2016-08-18 ENCOUNTER — Ambulatory Visit: Payer: Medicare Other | Admitting: Occupational Therapy

## 2016-08-18 ENCOUNTER — Ambulatory Visit: Payer: Medicare Other | Admitting: Physical Therapy

## 2016-08-18 DIAGNOSIS — R29818 Other symptoms and signs involving the nervous system: Secondary | ICD-10-CM

## 2016-08-18 DIAGNOSIS — R293 Abnormal posture: Secondary | ICD-10-CM

## 2016-08-18 DIAGNOSIS — R2681 Unsteadiness on feet: Secondary | ICD-10-CM

## 2016-08-18 DIAGNOSIS — R278 Other lack of coordination: Secondary | ICD-10-CM | POA: Diagnosis not present

## 2016-08-18 DIAGNOSIS — R2689 Other abnormalities of gait and mobility: Secondary | ICD-10-CM

## 2016-08-18 DIAGNOSIS — R29898 Other symptoms and signs involving the musculoskeletal system: Secondary | ICD-10-CM

## 2016-08-18 NOTE — Patient Instructions (Signed)
Coordination Exercises  Perform the following exercises for 20 minutes 1 times per day. Perform with both hand(s). Perform using big movements.   Flipping Cards: Place deck of cards on the table. Flip cards over by opening your hand big to grasp and then turn your palm up big.  Deal cards: Hold 1/2 or whole deck in your hand. Use thumb to push card off top of deck with one big push.  Pick up coins and place in coin bank or container: Pick up with big, intentional movements. Do not drag coin to the edge.  Practice writing: Slow down, write big, and focus on forming each letter.  Perform "Flicks"/hand stretches (PWR! Hands): Close hands then flick out your fingers with focus on opening hands, pulling wrists back, and extending elbows like you are pushing.

## 2016-08-18 NOTE — Therapy (Signed)
[place] [address] [place], [place], [address] Phone: [phone]   Fax:  [phone]  Occupational Therapy Treatment  Patient Details  Name: Pamela Patton MRN: [id number] Date of Birth: October 17, 1945 Referring Provider: Dr Carles Collet  Encounter Date: 08/18/2016      OT End of Session - 08/18/16 1110    Visit Number 2   Number of Visits 17   Date for OT Re-Evaluation 10/15/16   Authorization Type UHC MCR   OT Start Time 0933   OT Stop Time 1015   OT Time Calculation (min) 42 min   Activity Tolerance Patient tolerated treatment well   Behavior During Therapy [facility] for tasks assessed/performed      Past Medical History:  Diagnosis Date  . Anemia   . Arthritis   . Cancer [facility])    fibroid tumor  . Cataract    immature   . Chest pain summer 2012  . Chicken pox   . Diverticulosis    difficulty with colonoscopy 2005  . 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 Elyse Hsu 10/08/2012  . Fracture    radial/ulnar 03/2016  . GERD (gastroesophageal reflux disease)   . Heart murmur    innocent murmur  . High cholesterol   . Hypertension   . Hypothyroid, s/p radioactive iodine tx for graves 2001 08/24/2012  . Kidney disease   . UTI (urinary tract infection)   . Weight loss    eval with GI in 2016; s/p CT/MRI and PET scans    Past Surgical History:  Procedure Laterality Date  . ABDOMINAL HYSTERECTOMY  1989  . COLONOSCOPY    . DG BARIUM SWALLOW ([place] HX)     9 yrs ago per pt due to incomplete colonsocopy    There were no vitals filed for this visit.      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 No/denies                         PWR [facility]) - 08/18/16 1111    PWR! exercises Moves in  sitting   PWR! Up x20   PWR! Rock [employer]! Twist x20   PWR! Step x16   Comments mod v.c. and demonstration for larger amplitude movements             OT Education - 08/18/16 1111    Education provided Yes   Education Details PWR! seated, basic 4, basic coordination HEP   Person(s) Educated Patient   Methods Explanation;Demonstration;Verbal cues;Handout   Comprehension Verbalized understanding;Returned demonstration;Verbal cues required          OT Short Term Goals - 08/17/16 1505      OT SHORT TERM GOAL #1   Title I with PD specific HEP.   Time 4   Period Weeks   Status New     OT SHORT TERM GOAL #2   Title Pt will verbalize understanding of adapted strategies for ADLs/ IADLs.   Time 4   Period Weeks   Status New     OT SHORT TERM GOAL #3   Title Pt will improve LUE fine motor coordiantion as evidenced by performing 9 hole peg test in 65 secs or less.   Baseline LUE 80.81 secs,  RUE 48.32 secs   Time 4   Period Weeks   Status New     OT SHORT TERM GOAL #4   Title Pt will demonstrate improved ease with dressing as evidenced by performing PPT#4 (donning/ doffing jacket ) in 60 secs or less.   Time 4   Period Weeks   Status New     OT SHORT TERM GOAL #5   Title Pt will demonstrate improved RUE fine motor coordination as evidenced by decreasing 9 hole peg test score to 43 secs or less.   Time 4   Period Weeks   Status New     OT SHORT TERM GOAL #6   Title assess handwriting and set goal PRN.   Time 4   Period Weeks   Status New           OT Long Term Goals - 08/17/16 1512      OT LONG TERM GOAL #1   Title Pt will verbalize understanding of ways to prevent future PD related complications and verbalize understanding of community resources.   Time 8   Period Weeks   Status New     OT LONG TERM GOAL #2   Title Pt will demonstrate ability to retrieve a lightweight object at 120 shoulder flexion with -20 elbow ext for LUE.   Baseline 115, -28    Time 8   Period Weeks   Status New     OT LONG TERM GOAL #3   Title Pt will demosntrate ability to retrieve an item at 130 shoulder flexion and -20 elbow ext for RUE.   Baseline 130, -30   Time 8   Period Weeks   Status New     OT LONG TERM GOAL #4   Title Pt will decrease 3 button / unbutton test to 90 secs or less.   Baseline 2 mins, 4 secs   Time 8   Period Weeks   Status New     OT LONG TERM GOAL #5   Title Pt will decrease PPT#2 (simulated feeding) to 16 secs or less.   Time 8   Period Weeks   Status New     OT LONG TERM GOAL #6   Title Pt will verbalize understanding of ways to keep thinking skills sharp   Time 8   Period Weeks   Status New               Plan - 08/18/16 1108    Clinical Impression Statement Pt is progressing towards goals. She demonstrates understanding of PWR exercises seated andbasic coordination HEP. she may benefit from review.   Rehab Potential Good   OT Frequency 2x / week   OT Duration 8 weeks   OT Treatment/Interventions Self-care/ADL training;Therapeutic exercise;Cognitive remediation/compensation;Visual/perceptual remediation/compensation;Splinting;Neuromuscular education;Moist Heat;Parrafin;Fluidtherapy;Energy conservation;Therapeutic exercises;Patient/family education;Balance training;Therapeutic activities;Passive range of motion;Manual Therapy;DME and/or AE instruction;Contrast Bath;Ultrasound;Cryotherapy   Plan check to see how HEP is going, progress to PWR1 seated, add to coordination HEP.   OT Home Exercise Plan issued PWR! seated, basic coordination HEP.   Consulted and Agree with Plan of Care Patient      Patient will benefit from skilled therapeutic intervention in order to improve the following deficits and impairments:  Abnormal gait, Decreased cognition, Impaired vision/preception, Impaired flexibility, Decreased knowledge of use of DME, Decreased coordination, Decreased mobility, Impaired tone, Decreased strength,  Decreased range of motion, Decreased endurance, Decreased activity tolerance, Decreased balance, Decreased knowledge of precautions, Difficulty walking, Impaired perceived functional ability, Impaired UE  functional use  Visit Diagnosis: Other lack of coordination  Other symptoms and signs involving the musculoskeletal system  Other symptoms and signs involving the nervous system  Unsteadiness on feet    Problem List Patient Active Problem List   Diagnosis Date Noted  . PD (Parkinson's disease) ([place]) 08/08/2016  . Loss of weight 07/25/2016  . 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 Elyse Hsu 10/08/2012  . CKD (chronic kidney disease) stage 3, GFR 30-59 ml/min 10/08/2012  . Hypertension 08/24/2012  . Hypothyroid, s/p radioactive iodine tx for graves 2001 - managed by endocrinologist, Dr. Elyse Hsu 08/24/2012  . Hyperlipemia 08/24/2012  . Vitamin D deficiency 08/24/2012    Tatem Holsonback 08/18/2016, 11:15 AM  [facility] [address] [place], [place], [address] Phone: [phone]   Fax:  [phone]  Name: DEVANEY LACAYO MRN: [id number] Date of Birth: [birth year]

## 2016-08-21 NOTE — Therapy (Signed)
[facility] [address] [place], [place], [address] Phone: [phone]   Fax:  [phone]  Physical Therapy Evaluation  Patient Details  Name: OTTO EADER MRN: [id number] Date of Birth: 1946/08/15 Referring Provider: [name], DO  Encounter Date: 08/18/2016      PT End of Session - 08/21/16 1419    Visit Number 1   Number of Visits 13   Date for PT Re-Evaluation 10/17/16   Authorization Type UHC Medicare-GCODE every 10th visit   PT Start Time 0848   PT Stop Time 0934   PT Time Calculation (min) 46 min   Activity Tolerance Patient tolerated treatment well   Behavior During Therapy [facility] for tasks assessed/performed      Past Medical History:  Diagnosis Date  . Anemia   . Arthritis   . Cancer [facility])    fibroid tumor  . Cataract    immature   . Chest pain summer 2012  . Chicken pox   . Diverticulosis    difficulty with colonoscopy 2005  . 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 Elyse Hsu 10/08/2012  . Fracture    radial/ulnar 03/2016  . GERD (gastroesophageal reflux disease)   . Heart murmur    innocent murmur  . High cholesterol   . Hypertension   . Hypothyroid, s/p radioactive iodine tx for graves 2001 08/24/2012  . Kidney disease   . UTI (urinary tract infection)   . Weight loss    eval with GI in 2016; s/p CT/MRI and PET scans    Past Surgical History:  Procedure Laterality Date  . ABDOMINAL HYSTERECTOMY  1989  . COLONOSCOPY    . DG BARIUM SWALLOW ([place] HX)     9 yrs ago per pt due to incomplete colonsocopy    There were no vitals filed for this visit.       Subjective Assessment - 08/21/16 1403    Subjective Just started taking Sinemet this week.  Pt reports unsteadiness and balance issues, overall slowness of mobility over the past year or so.  She had a fall in June with L distal radius/ulna fracture.  She has tremors in LUE and LLE  as well as RUE.     Pertinent History DM, fracture L ulna/radius 03/2016, GERD, HTN   Patient Stated Goals Pt's goal for therapy is to help with strength and coordination.   Currently in Pain? No/denies            [facility] PT Assessment - 08/21/16 1403      Assessment   Medical Diagnosis Parkinsonism   Referring Provider Wells Guiles [name], DO   Onset Date/Surgical Date 08/08/16  Dr. Carles Collet visit     Precautions   Precautions Fall     Balance Screen   Has the patient fallen in the past 6 months Yes   How many times? 1  fell while turning/changing direction in kitchen   Has the patient had a decrease in activity level because of a fear of falling?  Yes   Is the patient reluctant to leave their home because of a fear of falling?  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   Leisure singing in choir, exercise     Posture/Postural Control   Posture/Postural Control Postural limitations   Postural Limitations Forward head;Rounded Shoulders  L shoulder lower than R     Tone   Assessment Location Right Lower Extremity;Left Lower Extremity     ROM / Strength   AROM / PROM / Strength Strength     Strength   Overall Strength Comments Grossly tested lower extremity strength at least 4/5 bilaterally     Transfers   Transfers Sit to Stand;Stand to Sit   Sit to Stand 5: Supervision;4: Min guard;Without upper extremity assist;From chair/3-in-1   Sit to Stand Details (indicate cue type and reason) has posterior pull with sit<>stand;  does not stand with full upright posture each time   Stand to Sit 5: Supervision;4: Min guard;Without upper extremity assist;To chair/3-in-1;Uncontrolled descent   Stand to Sit Details slight uncontrolled descent     Ambulation/Gait   Ambulation/Gait Yes   Ambulation/Gait Assistance 5: Supervision   Ambulation Distance (Feet) 300 Feet   Assistive device None   Gait Pattern Decreased arm swing - right;Decreased arm swing - left;Decreased trunk rotation;Trunk flexed;Step-through pattern;Decreased step length - right;Decreased step length - left;Poor foot clearance - left;Poor foot clearance - right   Ambulation Surface Level;Indoor   Gait velocity 9.83 sec= 3.34 ft/sec     Standardized Balance Assessment   Standardized Balance Assessment Timed Up and Go Test     Timed Up and Go Test   Normal TUG (seconds) 13.04   Manual TUG (seconds) 15   Cognitive TUG (seconds) 11.79     High Level Balance   High Level Balance Comments MiniBESTest:  23/28-see full note for details (RLE SLS <1.5 sec, difficulty with change of gait speed, with head turns with gait, slowed turns; >10% difference in dual task-manual)     RLE Tone   RLE Tone Mild  Rigidity     LLE Tone   LLE Tone Moderate  Rigidity         Mini-BESTest: Balance Evaluation Systems Test  2005-2013 [place]. All rights reserved. ________________________________________________________________________________________Anticipatory_________Subscore_5____/6 1. SIT TO STAND Instruction: "Cross your arms across your chest. Try not to use your hands unless you must.Do not let your legs lean against the back of the chair when you stand. Please stand up now." X(2) Normal: Comes to stand without use of hands and stabilizes independently. (1) Moderate: Comes to stand WITH use of hands on first attempt. (0) Severe: Unable to stand up from chair without assistance, OR needs several attempts with use of hands. 2. RISE  TO TOES Instruction: "Place your feet shoulder width apart. Place your hands on your hips. Try to rise as high as you can onto your toes. I will count out loud to 3 seconds. Try to hold this pose for at least 3 seconds. Look straight ahead. Rise now." X(2) Normal: Stable for 3 s with maximum height. (1) Moderate: Heels up, but not full range (smaller than when holding hands), OR noticeable instability for 3 s. (0) Severe: < 3 s. 3. STAND ON ONE LEG Instruction: "Look straight ahead. Keep your hands on your hips. Lift your leg off of the ground behind you without touching or resting your raised leg upon  your other standing leg. Stay standing on one leg as long as you can. Look straight ahead. Lift now." Left: Time in Seconds Trial 1:___3.07__Trial 2:__7.66___ (2) Normal: 20 s. X(1) Moderate: < 20 s. (0) Severe: Unable. Right: Time in Seconds Trial 1:__1.46___Trial 2:__1.35___ (2) Normal: 20 s. X(1) Moderate: < 20 s. (0) Severe: Unable To score each side separately use the trial with the longest time. To calculate the sub-score and total score use the side [left or right] with the lowest numerical score [i.e. the worse side]. ______________________________________________________________________________________Reactive Postural Control___________Subscore:__6___/6 4. COMPENSATORY STEPPING CORRECTION- FORWARD Instruction: "Stand with your feet shoulder width apart, arms at your sides. Lean forward against my hands beyond your forward limits. When I let go, do whatever is necessary, including taking a step, to avoid a fall." X(2) Normal: Recovers independently with a single, large step (second realignment step is allowed). (1) Moderate: More than one step used to recover equilibrium. (0) Severe: No step, OR would fall if not caught, OR falls spontaneously. 5. COMPENSATORY STEPPING CORRECTION- BACKWARD Instruction: "Stand with your feet shoulder width apart, arms at your sides. Lean backward  against my hands beyond your backward limits. When I let go, do whatever is necessary, including taking a step, to avoid a fall." X(2) Normal: Recovers independently with a single, large step. (1) Moderate: More than one step used to recover equilibrium. (0) Severe: No step, OR would fall if not caught, OR falls spontaneously. 6. COMPENSATORY STEPPING CORRECTION- LATERAL Instruction: "Stand with your feet together, arms down at your sides. Lean into my hand beyond your sideways limit. When I let go, do whatever is necessary, including taking a step, to avoid a fall." Left X(2) Normal: Recovers independently with 1 step (crossover or lateral OK). (1) Moderate: Several steps to recover equilibrium. (0) Severe: Falls, or cannot step. Right X(2) Normal: Recovers independently with 1 step (crossover or lateral OK). (1) Moderate: Several steps to recover equilibrium. (0) Severe: Falls, or cannot step. Use the side with the lowest score to calculate sub-score and total score. ____________________________________________________________________________________Sensory Orientation_____________Subscore:_____6____/6 7. STANCE (FEET TOGETHER); EYES OPEN, FIRM SURFACE Instruction: "Place your hands on your hips. Place your feet together until almost touching. Look straight ahead. Be as stable and still as possible, until I say stop." Time in seconds:________ X(2) Normal: 30 s. (1) Moderate: < 30 s. (0) Severe: Unable. 8. STANCE (FEET TOGETHER); EYES CLOSED, FOAM SURFACE Instruction: "Step onto the foam. Place your hands on your hips. Place your feet together until almost touching. Be as stable and still as possible, until I say stop. I will start timing when you close your eyes." Time in seconds:________ X(2) Normal: 30 s. (1) Moderate: < 30 s. (0) Severe: Unable. 9. INCLINE- EYES CLOSED Instruction: "Step onto the incline ramp. Please stand on the incline ramp with your toes toward the top.  Place your feet shoulder width apart and have your arms down at your sides. I will start timing when you close your eyes." Time in seconds:________ X(2) Normal: Stands independently 30 s and aligns with gravity. (1) Moderate: Stands independently <30 s OR aligns with surface. (0) Severe: Unable. _________________________________________________________________________________________Dynamic Gait ______Subscore_____6___/10 10. CHANGE IN GAIT SPEED Instruction: "Begin walking at your normal speed, when I tell you 'fast', walk as fast as you can. When I say 'slow', walk very slowly." (2) Normal: Significantly changes walking speed without imbalance. X(1) Moderate: Unable to change walking speed or signs of imbalance. (0) Severe: Unable to achieve significant change in walking speed AND  signs of imbalance. [place] - HORIZONTAL Instruction: "Begin walking at your normal speed, when I say "right", turn your head and look to the right. When I say "left" turn your head and look to the left. Try to keep yourself walking in a straight line." (2) Normal: performs head turns with no change in gait speed and good balance. X(1) Moderate: performs head turns with reduction in gait speed.-veers toward left with gait at times (0) Severe: performs head turns with imbalance. 12. WALK WITH PIVOT TURNS Instruction: "Begin walking at your normal speed. When I tell you to 'turn and stop', turn as quickly as you can, face the opposite direction, and stop. After the turn, your feet should be close together." (2) Normal: Turns with feet close FAST (< 3 steps) with good balance. X(1) Moderate: Turns with feet close SLOW (>4 steps) with good balance. (0) Severe: Cannot turn with feet close at any speed without imbalance. 13. STEP OVER OBSTACLES Instruction: "Begin walking at your normal speed. When you get to the box, step over it, not around it and keep walking." X(2) Normal: Able to step over box  with minimal change of gait speed and with good balance. (1) Moderate: Steps over box but touches box OR displays cautious behavior by slowing gait. (0) Severe: Unable to step over box OR steps around box. 14. TIMED UP & GO WITH DUAL TASK [3 METER WALK] Instruction TUG: "When I say 'Go', stand up from chair, walk at your normal speed across the tape on the floor, turn around, and come back to sit in the chair." Instruction TUG with Dual Task: "Count backwards by threes starting at ___. When I say 'Go', stand up from chair, walk at your normal speed across the tape on the floor, turn around, and come back to sit in the chair. Continue counting backwards the entire time." TUG: ____13.04____seconds; Dual Task TUG: manual:  15________seconds (2) Normal: No noticeable change in sitting, standing or walking while backward counting when compared to TUG without Dual Task. X(1) Moderate: Dual Task affects either counting OR walking (>10%) when compared to the TUG without Dual Task. (0) Severe: Stops counting while walking OR stops walking while counting. When scoring item 14, if subject's gait speed slows more than 10% between the TUG without and with a Dual Task the score should be decreased by a point. TOTAL SCORE: ____23____/28                     PT Short Term Goals - 08/21/16 1428      PT SHORT TERM GOAL #1   Title Pt will be independent with HEP for improved balance, posture, gait for improved functional mobility.  TARGET 09/01/16   Time 2   Period Weeks   Status New     PT SHORT TERM GOAL #2   Title Pt will perform at least 8 of 10 reps of sit<>stand transfers with minimal UE support with no posterior lean.   Time 2   Period Weeks   Status New     PT SHORT TERM GOAL #3   Title Pt will improve single limb stance to at least 3 seconds bilateral lower extremities for improved step and obstacle negotiation.   Time 2   Period Weeks   Status New           PT Long  Term Goals - 08/21/16 1432      PT LONG TERM GOAL #1  Title Pt will verbalize understanding of fall prevention in home environment.  TARGET 10/01/16   Time 6   Period Weeks   Status New     PT LONG TERM GOAL #2   Title Pt will improve TUG and TUG manual scores to less than 10% difference, for improved dual tasking with gait.   Time 6   Period Weeks   Status New     PT LONG TERM GOAL #3   Title Pt will negotiate at least 4 steps with handrails, step through pattern, modified independently for safe stair negotiation at home.   Time 6   Period Weeks   Status New     PT LONG TERM GOAL #4   Title Pt will ambulate outdoor surfaces, at least 1000 ft with appropriate assistive device, modified independently for improved community gait.   Time 6   Period Weeks   Status New     PT LONG TERM GOAL #5   Title Pt will verbalize plans for continued community fitness upon D/C from PT.   Time 6   Period Weeks   Status New               Plan - 08/21/16 1420    Clinical Impression Statement Pt is a 70 year old female who presents to OP PT with Parkinsonism, with decreased timing and coordination of gait, rigidity, abnormal posture, decreased balance, history of fall.  Pt lives alone and is interested in community exercise.  Pt would benefit from skilled PT to address the above stated deficits to improve functional mobility and decrease fall risk.   Rehab Potential Good   PT Frequency 2x / week   PT Duration 6 weeks  plus evaluation   PT Treatment/Interventions ADLs/Self Care Home Management;Functional mobility training;Gait training;DME Instruction;Therapeutic activities;Therapeutic exercise;Balance training;Neuromuscular re-education;Patient/family education;Stair training   PT Next Visit Plan Work on transfer training technique; initiate PWR! Moves in standing, hip/ankle/step strategies  STGs= 2 weeks   Consulted and Agree with Plan of Care Patient      Patient will benefit from  skilled therapeutic intervention in order to improve the following deficits and impairments:  Abnormal gait, Decreased balance, Decreased mobility, Decreased strength, Difficulty walking, Impaired tone, Postural dysfunction  Visit Diagnosis: Other abnormalities of gait and mobility  Unsteadiness on feet  Abnormal posture  Other symptoms and signs involving the nervous system     Problem List Patient Active Problem List   Diagnosis Date Noted  . PD (Parkinson's disease) ([place]) 08/08/2016  . Loss of weight 07/25/2016  . 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 Elyse Hsu 10/08/2012  . CKD (chronic kidney disease) stage 3, GFR 30-59 ml/min 10/08/2012  . Hypertension 08/24/2012  . Hypothyroid, s/p radioactive iodine tx for graves 2001 - managed by endocrinologist, Dr. Elyse Hsu 08/24/2012  . Hyperlipemia 08/24/2012  . Vitamin D deficiency 08/24/2012    Jerita Wimbush W. 08/21/2016, 2:37 PM  Frazier Butt., PT  [place] [address] [place], [place], [address] Phone: [phone]   Fax:  [phone]  Name: TENASIA TOMANEK MRN: [id number] Date of Birth: [birth year]

## 2016-08-23 ENCOUNTER — Ambulatory Visit: Payer: Medicare Other | Admitting: Occupational Therapy

## 2016-08-23 ENCOUNTER — Ambulatory Visit: Payer: Medicare Other | Admitting: Physical Therapy

## 2016-08-23 DIAGNOSIS — R278 Other lack of coordination: Secondary | ICD-10-CM | POA: Diagnosis not present

## 2016-08-23 DIAGNOSIS — R29898 Other symptoms and signs involving the musculoskeletal system: Secondary | ICD-10-CM

## 2016-08-23 DIAGNOSIS — R2689 Other abnormalities of gait and mobility: Secondary | ICD-10-CM

## 2016-08-23 DIAGNOSIS — R2681 Unsteadiness on feet: Secondary | ICD-10-CM

## 2016-08-23 DIAGNOSIS — R29818 Other symptoms and signs involving the nervous system: Secondary | ICD-10-CM

## 2016-08-23 DIAGNOSIS — R293 Abnormal posture: Secondary | ICD-10-CM

## 2016-08-23 NOTE — Patient Instructions (Signed)
Standing PWR! Moves handout provided -PWR! Up x 20, PWR! Rock x 20, PWR! Twist x 20 (reaching across body in corner), PWR! Step x 20 -to be performed once per day

## 2016-08-23 NOTE — Patient Instructions (Signed)

## 2016-08-23 NOTE — Therapy (Signed)
[facility] [address] [place], [place], [address] Phone: [phone]   Fax:  [phone]  Occupational Therapy Treatment  Patient Details  Name: Pamela Patton MRN: [id number] Date of Birth: 23-Dec-1945 Referring Provider: Dr Carles Collet  Encounter Date: 08/23/2016      OT End of Session - 08/23/16 1624    Visit Number 3   Number of Visits 17   Date for OT Re-Evaluation 10/15/16   Authorization Type UHC MCR   OT Start Time [id number]   OT Stop Time 1445   OT Time Calculation (min) 40 min   Activity Tolerance Patient tolerated treatment well   Behavior During Therapy [facility] for tasks assessed/performed      Past Medical History:  Diagnosis Date  . Anemia   . Arthritis   . Cancer [facility])    fibroid tumor  . Cataract    immature   . Chest pain summer 2012  . Chicken pox   . Diverticulosis    difficulty with colonoscopy 2005  . 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 Elyse Hsu 10/08/2012  . Fracture    radial/ulnar 03/2016  . GERD (gastroesophageal reflux disease)   . Heart murmur    innocent murmur  . High cholesterol   . Hypertension   . Hypothyroid, s/p radioactive iodine tx for graves 2001 08/24/2012  . Kidney disease   . UTI (urinary tract infection)   . Weight loss    eval with GI in 2016; s/p CT/MRI and PET scans    Past Surgical History:  Procedure Laterality Date  . ABDOMINAL HYSTERECTOMY  1989  . COLONOSCOPY    . DG BARIUM SWALLOW ([place] HX)     9 yrs ago per pt due to incomplete colonsocopy    There were no vitals filed for this visit.      Subjective Assessment - 08/23/16 1632    Subjective  Pt reports she has questions about the seated PWR! exercises   Pertinent History see Epic   Patient Stated Goals to maintain indepenence   Currently in Pain? No/denies                    Arm bike x 5 mins for conditioning, pt maintained 18-35 rpm,  pt demonstrates significant difficulty maintaining speed despite v.c.       PWR [facility]) - 08/23/16 1443    PWR! exercises Moves in sitting;Moves in [place]! Up x10   PWR! Rock x10   PWR! Twist x10   PWR! Step x10   Comments mod  v.c./ demonstration   PWR! Up x10   PWR! Rock x10   PWR! Twist x10   PWR! Step x10   Comments min v.c. and demonstration             OT Education - 08/23/16 1442    Education provided Yes   Education Details PWR! hands, modified quadraped PWR!   Person(s) Educated Patient   Methods Explanation;Demonstration;Verbal cues;Handout   Comprehension Verbalized understanding;Returned demonstration;Verbal cues required          OT Short Term Goals - 08/17/16 1505      OT SHORT TERM GOAL #1   Title I with PD specific HEP.   Time 4   Period Weeks   Status New     OT SHORT TERM GOAL #2   Title Pt will verbalize understanding of adapted strategies for ADLs/ IADLs.   Time 4  Period Weeks   Status New     OT SHORT TERM GOAL #3   Title Pt will improve LUE fine motor coordiantion as evidenced by performing 9 hole peg test in 65 secs or less.   Baseline LUE 80.81 secs, RUE 48.32 secs   Time 4   Period Weeks   Status New     OT SHORT TERM GOAL #4   Title Pt will demonstrate improved ease with dressing as evidenced by performing PPT#4 (donning/ doffing jacket ) in 60 secs or less.   Time 4   Period Weeks   Status New     OT SHORT TERM GOAL #5   Title Pt will demonstrate improved RUE fine motor coordination as evidenced by decreasing 9 hole peg test score to 43 secs or less.   Time 4   Period Weeks   Status New     OT SHORT TERM GOAL #6   Title assess handwriting and set goal PRN.   Time 4   Period Weeks   Status New           OT Long Term Goals - 08/17/16 1512      OT LONG TERM GOAL #1   Title Pt will verbalize understanding of ways to prevent future PD related complications and verbalize understanding of community  resources.   Time 8   Period Weeks   Status New     OT LONG TERM GOAL #2   Title Pt will demonstrate ability to retrieve a lightweight object at 120 shoulder flexion with -20 elbow ext for LUE.   Baseline 115, -28   Time 8   Period Weeks   Status New     OT LONG TERM GOAL #3   Title Pt will demosntrate ability to retrieve an item at 130 shoulder flexion and -20 elbow ext for RUE.   Baseline 130, -30   Time 8   Period Weeks   Status New     OT LONG TERM GOAL #4   Title Pt will decrease 3 button / unbutton test to 90 secs or less.   Baseline 2 mins, 4 secs   Time 8   Period Weeks   Status New     OT LONG TERM GOAL #5   Title Pt will decrease PPT#2 (simulated feeding) to 16 secs or less.   Time 8   Period Weeks   Status New     OT LONG TERM GOAL #6   Title Pt will verbalize understanding of ways to keep thinking skills sharp   Time 8   Period Weeks   Status New               Plan - 08/23/16 1622    Clinical Impression Statement Pt is progressing towards goals. She benefits from repetion/ review of exercises due to significant bradykinesia.   Rehab Potential Good   OT Frequency 2x / week   OT Duration 8 weeks   OT Treatment/Interventions Self-care/ADL training;Therapeutic exercise;Cognitive remediation/compensation;Visual/perceptual remediation/compensation;Splinting;Neuromuscular education;Moist Heat;Parrafin;Fluidtherapy;Energy conservation;Therapeutic exercises;Patient/family education;Balance training;Therapeutic activities;Passive range of motion;Manual Therapy;DME and/or AE instruction;Contrast Bath;Ultrasound;Cryotherapy   Plan exercise flow sheet, big movements with ADLs.   OT Home Exercise Plan issued PWR! seated, basic coordiantion HEP., modified quadraped, PWR! hands   Consulted and Agree with Plan of Care Patient      Patient will benefit from skilled therapeutic intervention in order to improve the following deficits and impairments:  Abnormal gait,  Decreased cognition, Impaired vision/preception, Impaired flexibility, Decreased  knowledge of use of DME, Decreased coordination, Decreased mobility, Impaired tone, Decreased strength, Decreased range of motion, Decreased endurance, Decreased activity tolerance, Decreased balance, Decreased knowledge of precautions, Difficulty walking, Impaired perceived functional ability, Impaired UE functional use  Visit Diagnosis: Other lack of coordination  Other symptoms and signs involving the musculoskeletal system  Other symptoms and signs involving the nervous system  Unsteadiness on feet    Problem List Patient Active Problem List   Diagnosis Date Noted  . PD (Parkinson's disease) ([place]) 08/08/2016  . Loss of weight 07/25/2016  . 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 Elyse Hsu 10/08/2012  . CKD (chronic kidney disease) stage 3, GFR 30-59 ml/min 10/08/2012  . Hypertension 08/24/2012  . Hypothyroid, s/p radioactive iodine tx for graves 2001 - managed by endocrinologist, Dr. Elyse Hsu 08/24/2012  . Hyperlipemia 08/24/2012  . Vitamin D deficiency 08/24/2012    Alita Waldren 08/23/2016, 4:33 PM Theone Murdoch, OTR/L [phone] Phone: [phone] 4:33 PM 08/23/16   [facility] [address] [place], [place], [address] Phone: [phone]   Fax:  [phone]  Name: Pamela Patton MRN: [id number] Date of Birth: 12/23/1945

## 2016-08-23 NOTE — Therapy (Signed)
[facility] [address] [place], [place], [address] Phone: [phone]   Fax:  [phone]  Physical Therapy Treatment  Patient Details  Name: Pamela Patton MRN: [id number] Date of Birth: 11/29/1945 Referring Provider: [name], DO  Encounter Date: 08/23/2016      PT End of Session - 08/23/16 2214    Visit Number 2   Number of Visits 13   Date for PT Re-Evaluation 10/17/16   Authorization Type UHC Medicare-GCODE every 10th visit   PT Start Time 1316   PT Stop Time 1400   PT Time Calculation (min) 44 min   Activity Tolerance Patient tolerated treatment well   Behavior During Therapy [facility] for tasks assessed/performed      Past Medical History:  Diagnosis Date  . Anemia   . Arthritis   . Cancer [facility])    fibroid tumor  . Cataract    immature   . Chest pain summer 2012  . Chicken pox   . Diverticulosis    difficulty with colonoscopy 2005  . 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 Elyse Hsu 10/08/2012  . Fracture    radial/ulnar 03/2016  . GERD (gastroesophageal reflux disease)   . Heart murmur    innocent murmur  . High cholesterol   . Hypertension   . Hypothyroid, s/p radioactive iodine tx for graves 2001 08/24/2012  . Kidney disease   . UTI (urinary tract infection)   . Weight loss    eval with GI in 2016; s/p CT/MRI and PET scans    Past Surgical History:  Procedure Laterality Date  . ABDOMINAL HYSTERECTOMY  1989  . COLONOSCOPY    . DG BARIUM SWALLOW ([place] HX)     9 yrs ago per pt due to incomplete colonsocopy    There were no vitals filed for this visit.      Subjective Assessment - 08/23/16 1321    Subjective Just trying to get organized to make sure I can get in all the exercises.    Pertinent History DM, fracture L ulna/radius 03/2016, GERD, HTN   Patient Stated Goals Pt's goal for therapy is to help with strength and coordination.    Currently in Pain? No/denies                         [facility] Adult PT Treatment/Exercise - 08/23/16 0001      Transfers   Transfers Sit to Stand;Stand to Sit   Sit to Stand 5: Supervision;4: Min guard;Without upper extremity assist;From chair/3-in-1   Sit to Stand Details (indicate cue type and reason) Cues provided for sit<>stand technique, including cues for initial scooting, forward lean and increased momentum for sit<>stand   Stand to Sit 5: Supervision;4: Min guard;Without upper extremity assist;To chair/3-in-1   Number of Reps Other reps (comment);Other sets (comment)  x 5 reps from 20" surface, then x 5 from 18" surface     Ambulation/Gait   Ambulation/Gait Yes   Ambulation/Gait Assistance 5: Supervision   Ambulation Distance (Feet) 400 Feet  then 100   Assistive device None  bilateral walking poles for arm swing facilitation   Gait Pattern Decreased arm swing - right;Decreased arm swing - left;Decreased trunk rotation;Trunk flexed;Step-through pattern;Decreased step length - right;Decreased step length - left;Poor foot clearance - left;Poor foot clearance - right   Gait [employer] initiaing turning with short distance walk and turn with large step to turn and with  weightshifting turns (to simulate how patient walks in choir area at church)           [facility]) - 08/23/16 2209    PWR! exercises Moves in standing   PWR! Up x 10   PWR! Rock x 20   PWR! Twist x 20  at counter, then modified with reaching in corner   PWR Step x 20  Additional practice simulating turning to step at church   Comments Modified trunk rotation to reaching across body in corner for improved weightshfiting and ability for trunk rotation.  Provided visual and verbal cues for technique and intensity.             PT Education - 08/23/16 2213    Education provided Yes   Education Details Transfer technique, PWR! Moves in standing   Person(s) Educated Patient    Methods Explanation;Demonstration;Handout   Comprehension Verbalized understanding;Returned demonstration          PT Short Term Goals - 08/21/16 1428      PT SHORT TERM GOAL #1   Title Pt will be independent with HEP for improved balance, posture, gait for improved functional mobility.  TARGET 09/01/16   Time 2   Period Weeks   Status New     PT SHORT TERM GOAL #2   Title Pt will perform at least 8 of 10 reps of sit<>stand transfers with minimal UE support with no posterior lean.   Time 2   Period Weeks   Status New     PT SHORT TERM GOAL #3   Title Pt will improve single limb stance to at least 3 seconds bilateral lower extremities for improved step and obstacle negotiation.   Time 2   Period Weeks   Status New           PT Long Term Goals - 08/21/16 1432      PT LONG TERM GOAL #1   Title Pt will verbalize understanding of fall prevention in home environment.  TARGET 10/01/16   Time 6   Period Weeks   Status New     PT LONG TERM GOAL #2   Title Pt will improve TUG and TUG manual scores to less than 10% difference, for improved dual tasking with gait.   Time 6   Period Weeks   Status New     PT LONG TERM GOAL #3   Title Pt will negotiate at least 4 steps with handrails, step through pattern, modified independently for safe stair negotiation at home.   Time 6   Period Weeks   Status New     PT LONG TERM GOAL #4   Title Pt will ambulate outdoor surfaces, at least 1000 ft with appropriate assistive device, modified independently for improved community gait.   Time 6   Period Weeks   Status New     PT LONG TERM GOAL #5   Title Pt will verbalize plans for continued community fitness upon D/C from PT.   Time 6   Period Weeks   Status New               Plan - 08/23/16 2214    Clinical Impression Statement Initiated HEP today for PWR! Moves in standing and worked on transfers and intiating turns with gait.  Pt will continue to benefit from further  skilled PT to address gait, posture, and balance.   Rehab Potential Good   PT Frequency 2x / week   PT Duration 6 weeks  plus evaluation   PT Treatment/Interventions ADLs/Self Care Home Management;Functional mobility training;Gait training;DME Instruction;Therapeutic activities;Therapeutic exercise;Balance training;Neuromuscular re-education;Patient/family education;Stair training   PT Next Visit Plan Work on transfer training technique (add to HEP); review PWR! Moves in standing, hip/ankle/step strategies  STGs= 2 weeks   Consulted and Agree with Plan of Care Patient      Patient will benefit from skilled therapeutic intervention in order to improve the following deficits and impairments:  Abnormal gait, Decreased balance, Decreased mobility, Decreased strength, Difficulty walking, Impaired tone, Postural dysfunction  Visit Diagnosis: Unsteadiness on feet  Abnormal posture  Other abnormalities of gait and mobility     Problem List Patient Active Problem List   Diagnosis Date Noted  . PD (Parkinson's disease) ([place]) 08/08/2016  . Loss of weight 07/25/2016  . 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 Elyse Hsu 10/08/2012  . CKD (chronic kidney disease) stage 3, GFR 30-59 ml/min 10/08/2012  . Hypertension 08/24/2012  . Hypothyroid, s/p radioactive iodine tx for graves 2001 - managed by endocrinologist, Dr. Elyse Hsu 08/24/2012  . Hyperlipemia 08/24/2012  . Vitamin D deficiency 08/24/2012    MARRIOTT,AMY W. 08/23/2016, 10:17 PM Frazier Butt., PT [place] [facility] [address] [place], [place], [address] Phone: [phone]   Fax:  [phone]  Name: Pamela Patton MRN: [id number] Date of Birth: [birth year]

## 2016-08-29 ENCOUNTER — Ambulatory Visit: Payer: Medicare Other | Admitting: Occupational Therapy

## 2016-08-29 ENCOUNTER — Ambulatory Visit: Payer: Medicare Other | Admitting: Physical Therapy

## 2016-08-29 DIAGNOSIS — R2681 Unsteadiness on feet: Secondary | ICD-10-CM

## 2016-08-29 DIAGNOSIS — R278 Other lack of coordination: Secondary | ICD-10-CM | POA: Diagnosis not present

## 2016-08-29 DIAGNOSIS — R293 Abnormal posture: Secondary | ICD-10-CM

## 2016-08-29 DIAGNOSIS — R29818 Other symptoms and signs involving the nervous system: Secondary | ICD-10-CM

## 2016-08-29 DIAGNOSIS — R2689 Other abnormalities of gait and mobility: Secondary | ICD-10-CM

## 2016-08-29 DIAGNOSIS — R29898 Other symptoms and signs involving the musculoskeletal system: Secondary | ICD-10-CM

## 2016-08-29 NOTE — Therapy (Signed)
[facility] [address] [place], [place], [address] Phone: [phone]   Fax:  [phone]  Physical Therapy Treatment  Patient Details  Name: Pamela Patton MRN: [id number] Date of Birth: 03/20/1946 Referring Provider: [name], DO  Encounter Date: 08/29/2016      PT End of Session - 08/29/16 0918    Visit Number 3   Number of Visits 13   Date for PT Re-Evaluation 10/17/16   PT Start Time 0845   PT Stop Time 0926   PT Time Calculation (min) 41 min   Activity Tolerance Patient tolerated treatment well   Behavior During Therapy [facility] for tasks assessed/performed      Past Medical History:  Diagnosis Date  . Anemia   . Arthritis   . Cancer [facility])    fibroid tumor  . Cataract    immature   . Chest pain summer 2012  . Chicken pox   . Diverticulosis    difficulty with colonoscopy 2005  . 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 Elyse Hsu 10/08/2012  . Fracture    radial/ulnar 03/2016  . GERD (gastroesophageal reflux disease)   . Heart murmur    innocent murmur  . High cholesterol   . Hypertension   . Hypothyroid, s/p radioactive iodine tx for graves 2001 08/24/2012  . Kidney disease   . UTI (urinary tract infection)   . Weight loss    eval with GI in 2016; s/p CT/MRI and PET scans    Past Surgical History:  Procedure Laterality Date  . ABDOMINAL HYSTERECTOMY  1989  . COLONOSCOPY    . DG BARIUM SWALLOW ([place] HX)     9 yrs ago per pt due to incomplete colonsocopy    There were no vitals filed for this visit.      Subjective Assessment - 08/29/16 0843    Subjective doing well, frustrated a little bit.  having a hard time getting exercises in.  "it's a hard time of year."   Patient Stated Goals Pt's goal for therapy is to help with strength and coordination.   Currently in Pain? No/denies                         [facility] Adult PT  Treatment/Exercise - 08/29/16 0918      Knee/Hip Exercises: Aerobic   Nustep L5 x 8 min           PWR [facility]) - 08/29/16 0849    PWR! exercises Moves in standing   PWR! Up x20   PWR! Rock [employer]! Twist x20   PWR Step x20   Comments min cues for technique          Balance Exercises - 08/29/16 0910      Balance Exercises: Standing   Rockerboard Anterior/posterior;Lateral;Head turns;Intermittent UE support;10 reps  weightshifting with minguard A             PT Short Term Goals - 08/21/16 1428      PT SHORT TERM GOAL #1   Title Pt will be independent with HEP for improved balance, posture, gait for improved functional mobility.  TARGET 09/01/16   Time 2   Period Weeks   Status New     PT SHORT TERM GOAL #2   Title Pt will perform at least 8 of 10 reps of sit<>stand transfers with minimal UE support with no posterior lean.   Time  2   Period Weeks   Status New     PT SHORT TERM GOAL #3   Title Pt will improve single limb stance to at least 3 seconds bilateral lower extremities for improved step and obstacle negotiation.   Time 2   Period Weeks   Status New           PT Long Term Goals - 08/21/16 1432      PT LONG TERM GOAL #1   Title Pt will verbalize understanding of fall prevention in home environment.  TARGET 10/01/16   Time 6   Period Weeks   Status New     PT LONG TERM GOAL #2   Title Pt will improve TUG and TUG manual scores to less than 10% difference, for improved dual tasking with gait.   Time 6   Period Weeks   Status New     PT LONG TERM GOAL #3   Title Pt will negotiate at least 4 steps with handrails, step through pattern, modified independently for safe stair negotiation at home.   Time 6   Period Weeks   Status New     PT LONG TERM GOAL #4   Title Pt will ambulate outdoor surfaces, at least 1000 ft with appropriate assistive device, modified independently for improved community gait.   Time 6   Period Weeks   Status New      PT LONG TERM GOAL #5   Title Pt will verbalize plans for continued community fitness upon D/C from PT.   Time 6   Period Weeks   Status New               Plan - 08/29/16 0919    Clinical Impression Statement Pt tolerated session well today with only minguard A with rockerboard activities.  Good use of hip strategy with rockerboard.  Will continue to benefit from PT to maximize function.   PT Treatment/Interventions ADLs/Self Care Home Management;Functional mobility training;Gait training;DME Instruction;Therapeutic activities;Therapeutic exercise;Balance training;Neuromuscular re-education;Patient/family education;Stair training   PT Next Visit Plan Work on transfer training technique (add to HEP); review PWR! Moves in standing, hip/ankle/step strategies; assess STGs   Consulted and Agree with Plan of Care Patient      Patient will benefit from skilled therapeutic intervention in order to improve the following deficits and impairments:  Abnormal gait, Decreased balance, Decreased mobility, Decreased strength, Difficulty walking, Impaired tone, Postural dysfunction  Visit Diagnosis: Unsteadiness on feet  Abnormal posture  Other abnormalities of gait and mobility     Problem List Patient Active Problem List   Diagnosis Date Noted  . PD (Parkinson's disease) ([place]) 08/08/2016  . Loss of weight 07/25/2016  . 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 Elyse Hsu 10/08/2012  . CKD (chronic kidney disease) stage 3, GFR 30-59 ml/min 10/08/2012  . Hypertension 08/24/2012  . Hypothyroid, s/p radioactive iodine tx for graves 2001 - managed by endocrinologist, Dr. Elyse Hsu 08/24/2012  . Hyperlipemia 08/24/2012  . Vitamin D deficiency 08/24/2012        Laureen Abrahams, PT, DPT 08/29/16 9:26 AM    [facility] [address] [place], [place], [address] Phone:  [phone]   Fax:  [phone]  Name: JIA LEOTTA MRN: [id number] Date of Birth: [birth year]

## 2016-08-29 NOTE — Patient Instructions (Signed)
(  Exercise) Monday Tuesday Wednesday Thursday Friday Saturday Sunday   PWR! standing           PWR! sitting           PWR! Standing with hands on table           PWR! hands           Coordination Exercises

## 2016-08-29 NOTE — Therapy (Signed)
[place] [facility] [address] [place], [place], [address] Phone: [phone]   Fax:  [phone]  Occupational Therapy Treatment  Patient Details  Name: Pamela Patton MRN: [id number] Date of Birth: 06-20-1946 Referring Provider: Dr Carles Collet  Encounter Date: 08/29/2016      OT End of Session - 08/29/16 0941    Visit Number 4   Number of Visits 17   Date for OT Re-Evaluation 10/15/16   Authorization Type UHC MCR   OT Start Time [id number]   OT Stop Time 1015   OT Time Calculation (min) 38 min   Activity Tolerance Patient tolerated treatment well   Behavior During Therapy [facility] for tasks assessed/performed      Past Medical History:  Diagnosis Date  . Anemia   . Arthritis   . Cancer [facility])    fibroid tumor  . Cataract    immature   . Chest pain summer 2012  . Chicken pox   . Diverticulosis    difficulty with colonoscopy 2005  . 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 Elyse Hsu 10/08/2012  . Fracture    radial/ulnar 03/2016  . GERD (gastroesophageal reflux disease)   . Heart murmur    innocent murmur  . High cholesterol   . Hypertension   . Hypothyroid, s/p radioactive iodine tx for graves 2001 08/24/2012  . Kidney disease   . UTI (urinary tract infection)   . Weight loss    eval with GI in 2016; s/p CT/MRI and PET scans    Past Surgical History:  Procedure Laterality Date  . ABDOMINAL HYSTERECTOMY  1989  . COLONOSCOPY    . DG BARIUM SWALLOW ([place] HX)     9 yrs ago per pt due to incomplete colonsocopy    There were no vitals filed for this visit.      Subjective Assessment - 08/29/16 0938    Subjective  Pt reports that L wrist was a little sore over the holidays   Pertinent History see Epic   Patient Stated Goals to maintain indepenence   Currently in Pain? No/denies        Neuro re-ed:  PWR! Moves (basic 4) in sitting x 10-20 each with min cues For incr  movement amplitude and movement quality.  Arm bike x43min level 1 for reciprocal movement with cues/target of at least 40rpms for intensity while maintaining movement amplitude/reciprocal movement.   Pt maintained 20-34 Rpms.  For last min pt was cued to go as fast as possible with max rpms achieved (34rpms).  Educated pt regarding bradykinesia and how it affects movement.  Pt verbalized understanding.                       OT Education - 08/29/16 1010    Education provided Yes   Education Details PD exercise chart and how to use   Person(s) Educated Patient   Methods Explanation;Verbal cues;Handout   Comprehension Verbalized understanding          OT Short Term Goals - 08/17/16 1505      OT SHORT TERM GOAL #1   Title I with PD specific HEP.   Time 4   Period Weeks   Status New     OT SHORT TERM GOAL #2   Title Pt will verbalize understanding of adapted strategies for ADLs/ IADLs.   Time 4   Period Weeks   Status New     OT  SHORT TERM GOAL #3   Title Pt will improve LUE fine motor coordiantion as evidenced by performing 9 hole peg test in 65 secs or less.   Baseline LUE 80.81 secs, RUE 48.32 secs   Time 4   Period Weeks   Status New     OT SHORT TERM GOAL #4   Title Pt will demonstrate improved ease with dressing as evidenced by performing PPT#4 (donning/ doffing jacket ) in 60 secs or less.   Time 4   Period Weeks   Status New     OT SHORT TERM GOAL #5   Title Pt will demonstrate improved RUE fine motor coordination as evidenced by decreasing 9 hole peg test score to 43 secs or less.   Time 4   Period Weeks   Status New     OT SHORT TERM GOAL #6   Title assess handwriting and set goal PRN.   Time 4   Period Weeks   Status New           OT Long Term Goals - 08/17/16 1512      OT LONG TERM GOAL #1   Title Pt will verbalize understanding of ways to prevent future PD related complications and verbalize understanding of community  resources.   Time 8   Period Weeks   Status New     OT LONG TERM GOAL #2   Title Pt will demonstrate ability to retrieve a lightweight object at 120 shoulder flexion with -20 elbow ext for LUE.   Baseline 115, -28   Time 8   Period Weeks   Status New     OT LONG TERM GOAL #3   Title Pt will demosntrate ability to retrieve an item at 130 shoulder flexion and -20 elbow ext for RUE.   Baseline 130, -30   Time 8   Period Weeks   Status New     OT LONG TERM GOAL #4   Title Pt will decrease 3 button / unbutton test to 90 secs or less.   Baseline 2 mins, 4 secs   Time 8   Period Weeks   Status New     OT LONG TERM GOAL #5   Title Pt will decrease PPT#2 (simulated feeding) to 16 secs or less.   Time 8   Period Weeks   Status New     OT LONG TERM GOAL #6   Title Pt will verbalize understanding of ways to keep thinking skills sharp   Time 8   Period Weeks   Status New               Plan - 08/29/16 1013    Clinical Impression Statement Pt is progressing towards goals.  Pt reports incr awareness of hand positioning   Rehab Potential Good   OT Frequency 2x / week   OT Duration 8 weeks   OT Treatment/Interventions Self-care/ADL training;Therapeutic exercise;Cognitive remediation/compensation;Visual/perceptual remediation/compensation;Splinting;Neuromuscular education;Moist Heat;Parrafin;Fluidtherapy;Energy conservation;Therapeutic exercises;Patient/family education;Balance training;Therapeutic activities;Passive range of motion;Manual Therapy;DME and/or AE instruction;Contrast Bath;Ultrasound;Cryotherapy   Plan large amplitude movements for ADLs   OT Home Exercise Plan issued PWR! seated, basic coordiantion HEP., modified quadraped, PWR! hands   Consulted and Agree with Plan of Care Patient      Patient will benefit from skilled therapeutic intervention in order to improve the following deficits and impairments:  Abnormal gait, Decreased cognition, Impaired  vision/preception, Impaired flexibility, Decreased knowledge of use of DME, Decreased coordination, Decreased mobility, Impaired tone, Decreased strength, Decreased range of  motion, Decreased endurance, Decreased activity tolerance, Decreased balance, Decreased knowledge of precautions, Difficulty walking, Impaired perceived functional ability, Impaired UE functional use  Visit Diagnosis: Other symptoms and signs involving the nervous system  Other symptoms and signs involving the musculoskeletal system  Other lack of coordination  Abnormal posture    Problem List Patient Active Problem List   Diagnosis Date Noted  . PD (Parkinson's disease) ([place]) 08/08/2016  . Loss of weight 07/25/2016  . 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 Elyse Hsu 10/08/2012  . CKD (chronic kidney disease) stage 3, GFR 30-59 ml/min 10/08/2012  . Hypertension 08/24/2012  . Hypothyroid, s/p radioactive iodine tx for graves 2001 - managed by endocrinologist, Dr. Elyse Hsu 08/24/2012  . Hyperlipemia 08/24/2012  . Vitamin D deficiency 08/24/2012    [facility] 08/29/2016, 1:07 PM  [place] [address] [place], [place], [address] Phone: [phone]   Fax:  [phone]  Name: LOUISE COONAN MRN: [id number] Date of Birth: 09-20-1946   Vianne Bulls, OTR/L [facility] [address] [place], [place]  [address] [phone] phone [phone] 08/29/16 1:07 PM

## 2016-09-01 ENCOUNTER — Ambulatory Visit: Payer: Medicare Other | Admitting: Physical Therapy

## 2016-09-01 ENCOUNTER — Ambulatory Visit: Payer: Medicare Other | Admitting: Occupational Therapy

## 2016-09-01 DIAGNOSIS — R29898 Other symptoms and signs involving the musculoskeletal system: Secondary | ICD-10-CM

## 2016-09-01 DIAGNOSIS — R2681 Unsteadiness on feet: Secondary | ICD-10-CM

## 2016-09-01 DIAGNOSIS — R278 Other lack of coordination: Secondary | ICD-10-CM

## 2016-09-01 DIAGNOSIS — R29818 Other symptoms and signs involving the nervous system: Secondary | ICD-10-CM

## 2016-09-01 DIAGNOSIS — R293 Abnormal posture: Secondary | ICD-10-CM

## 2016-09-01 DIAGNOSIS — R2689 Other abnormalities of gait and mobility: Secondary | ICD-10-CM

## 2016-09-01 NOTE — Therapy (Signed)
[place] [address] [place], [place], [address] Phone: [phone]   Fax:  [phone]  Physical Therapy Treatment  Patient Details  Name: Pamela Patton MRN: [id number] Date of Birth: 01/21/1946 Referring Provider: [name], DO  Encounter Date: 09/01/2016      PT End of Session - 09/01/16 0920    Visit Number 4   Number of Visits 13   Date for PT Re-Evaluation 10/17/16   Authorization Type UHC Medicare-GCODE every 10th visit   PT Start Time 0845   PT Stop Time 0925   PT Time Calculation (min) 40 min   Activity Tolerance Patient tolerated treatment well   Behavior During Therapy [facility] for tasks assessed/performed      Past Medical History:  Diagnosis Date  . Anemia   . Arthritis   . Cancer [facility])    fibroid tumor  . Cataract    immature   . Chest pain summer 2012  . Chicken pox   . Diverticulosis    difficulty with colonoscopy 2005  . 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 Elyse Hsu 10/08/2012  . Fracture    radial/ulnar 03/2016  . GERD (gastroesophageal reflux disease)   . Heart murmur    innocent murmur  . High cholesterol   . Hypertension   . Hypothyroid, s/p radioactive iodine tx for graves 2001 08/24/2012  . Kidney disease   . UTI (urinary tract infection)   . Weight loss    eval with GI in 2016; s/p CT/MRI and PET scans    Past Surgical History:  Procedure Laterality Date  . ABDOMINAL HYSTERECTOMY  1989  . COLONOSCOPY    . DG BARIUM SWALLOW ([place] HX)     9 yrs ago per pt due to incomplete colonsocopy    There were no vitals filed for this visit.      Subjective Assessment - 09/01/16 0849    Subjective doing well, learning more and more every session   Pertinent History DM, fracture L ulna/radius 03/2016, GERD, HTN   Patient Stated Goals Pt's goal for therapy is to help with strength and coordination.   Currently in Pain? No/denies             [facility] PT Assessment - 09/01/16 0001      Functional Tests   Functional tests Single leg stance     Single Leg Stance   Comments RLE > 20 sec; LLE 6.78 sec                     [facility] Adult PT Treatment/Exercise - 09/01/16 0001      Transfers   Number of Reps 10 reps  sit to stand   Transfer Cueing post lean noted on 1 rep only; able to correct independently     Knee/Hip Exercises: Aerobic   Nustep L5 x 6 min           PWR [facility]) - 09/01/16 2423    PWR! exercises Moves in standing   PWR! Up x20   PWR! [employer]! Twist x20   PWR Step x20   Basic 4 Flow x20   Comments pt independent          Balance Exercises - 09/01/16 0919      Balance Exercises: Standing   Stepping Strategy Anterior;Posterior;Lateral;10 reps;UE support             PT Short Term Goals -  09/01/16 0920      PT SHORT TERM GOAL #1   Title Pt will be independent with HEP for improved balance, posture, gait for improved functional mobility.  TARGET 09/01/16   Status Achieved     PT SHORT TERM GOAL #2   Title Pt will perform at least 8 of 10 reps of sit<>stand transfers with minimal UE support with no posterior lean.   Status Achieved     PT SHORT TERM GOAL #3   Title Pt will improve single limb stance to at least 3 seconds bilateral lower extremities for improved step and obstacle negotiation.   Status Achieved           PT Long Term Goals - 08/21/16 1432      PT LONG TERM GOAL #1   Title Pt will verbalize understanding of fall prevention in home environment.  TARGET 10/01/16   Time 6   Period Weeks   Status New     PT LONG TERM GOAL #2   Title Pt will improve TUG and TUG manual scores to less than 10% difference, for improved dual tasking with gait.   Time 6   Period Weeks   Status New     PT LONG TERM GOAL #3   Title Pt will negotiate at least 4 steps with handrails, step through pattern, modified independently for safe stair negotiation  at home.   Time 6   Period Weeks   Status New     PT LONG TERM GOAL #4   Title Pt will ambulate outdoor surfaces, at least 1000 ft with appropriate assistive device, modified independently for improved community gait.   Time 6   Period Weeks   Status New     PT LONG TERM GOAL #5   Title Pt will verbalize plans for continued community fitness upon D/C from PT.   Time 6   Period Weeks   Status New               Plan - 09/01/16 0920    Clinical Impression Statement Pt has met all STGs and is progressing well with PT.  Motivated and compliant with HEP.  Will continue to benefit from PT to maximize function.   PT Treatment/Interventions ADLs/Self Care Home Management;Functional mobility training;Gait training;DME Instruction;Therapeutic activities;Therapeutic exercise;Balance training;Neuromuscular re-education;Patient/family education;Stair training   PT Next Visit Plan Work on transfer training technique (add to HEP); review PWR! Moves in standing, hip/ankle/step strategies   Consulted and Agree with Plan of Care Patient      Patient will benefit from skilled therapeutic intervention in order to improve the following deficits and impairments:  Abnormal gait, Decreased balance, Decreased mobility, Decreased strength, Difficulty walking, Impaired tone, Postural dysfunction  Visit Diagnosis: Unsteadiness on feet  Abnormal posture  Other abnormalities of gait and mobility     Problem List Patient Active Problem List   Diagnosis Date Noted  . PD (Parkinson's disease) ([place]) 08/08/2016  . Loss of weight 07/25/2016  . 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 Elyse Hsu 10/08/2012  . CKD (chronic kidney disease) stage 3, GFR 30-59 ml/min 10/08/2012  . Hypertension 08/24/2012  . Hypothyroid, s/p radioactive iodine tx for graves 2001 - managed by endocrinologist, Dr. Elyse Hsu 08/24/2012  . Hyperlipemia 08/24/2012  . Vitamin D  deficiency 08/24/2012      Laureen Abrahams, PT, DPT 09/01/16 9:22 AM    [place] [address] [place], [place],  [address] Phone: [phone]   Fax:  [phone]  Name: Pamela Patton MRN: [id number] Date of Birth: 1946/02/29

## 2016-09-01 NOTE — Therapy (Signed)
[facility] [address] [place], [place], [address] Phone: [phone]   Fax:  [phone]  Occupational Therapy Treatment  Patient Details  Name: Pamela Patton MRN: [id number] Date of Birth: 10-30-1945 Referring Provider: Dr Carles Collet  Encounter Date: 09/01/2016      OT End of Session - 09/01/16 0811    Visit Number 5   Number of Visits 17   Date for OT Re-Evaluation 10/15/16   Authorization Type UHC MCR   OT Start Time 0804   OT Stop Time 0845   OT Time Calculation (min) 41 min   Activity Tolerance Patient tolerated treatment well   Behavior During Therapy [facility] for tasks assessed/performed      Past Medical History:  Diagnosis Date  . Anemia   . Arthritis   . Cancer [facility])    fibroid tumor  . Cataract    immature   . Chest pain summer 2012  . Chicken pox   . Diverticulosis    difficulty with colonoscopy 2005  . 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 Elyse Hsu 10/08/2012  . Fracture    radial/ulnar 03/2016  . GERD (gastroesophageal reflux disease)   . Heart murmur    innocent murmur  . High cholesterol   . Hypertension   . Hypothyroid, s/p radioactive iodine tx for graves 2001 08/24/2012  . Kidney disease   . UTI (urinary tract infection)   . Weight loss    eval with GI in 2016; s/p CT/MRI and PET scans    Past Surgical History:  Procedure Laterality Date  . ABDOMINAL HYSTERECTOMY  1989  . COLONOSCOPY    . DG BARIUM SWALLOW ([place] HX)     9 yrs ago per pt due to incomplete colonsocopy    There were no vitals filed for this visit.      Subjective Assessment - 09/01/16 0810    Subjective  "Is there an easier way to get this coat off"   Pertinent History see Epic   Patient Stated Goals to maintain indepenence   Currently in Pain? No/denies            Self Care:     Began instruction in importance and  in use of large amplitude movements  to prevent future complications related to PD.  Pt verbalized understanding.  (Discussed/verbally reviewed strategies for bathing, cutting food, dressing, turning pages of book, and issued handout, but needs review and incr focus on IADLs).   Practiced grasp/release of cylinder objects (bottles, cups) with use of PWR! Hands/large amplitude movements with min  cueing.  Practice washing hands with focus on large amplitude movements with min v.c. And demo.  Dressing: Practiced donning/doffing jacket using large amplitude movement strategies after initial instruction.  Pt demo improvement with repetition and use/min-mod  cues for large amplitude movements, but will benefit from reinforcement  (feet apart, LUE first, trunk rotation, big hand, big push/movements).   Functional mobility:   Scooting chair up to table with focus on large amplitude movements, min v.c.                       OT Education - 09/01/16 [id number]    Education Details Coordination HEP Review (min-mod cueing for large amplitude); Large amplitude movements for ADLs   Person(s) Educated Patient   Methods Explanation;Demonstration;Handout;Verbal cues;Tactile cues  for ADL strategies   Comprehension Verbalized understanding;Returned demonstration;Verbal cues required  OT Short Term Goals - 08/17/16 1505      OT SHORT TERM GOAL #1   Title I with PD specific HEP.   Time 4   Period Weeks   Status New     OT SHORT TERM GOAL #2   Title Pt will verbalize understanding of adapted strategies for ADLs/ IADLs.   Time 4   Period Weeks   Status New     OT SHORT TERM GOAL #3   Title Pt will improve LUE fine motor coordiantion as evidenced by performing 9 hole peg test in 65 secs or less.   Baseline LUE 80.81 secs, RUE 48.32 secs   Time 4   Period Weeks   Status New     OT SHORT TERM GOAL #4   Title Pt will demonstrate improved ease with dressing as evidenced by performing PPT#4 (donning/ doffing jacket  ) in 60 secs or less.   Time 4   Period Weeks   Status New     OT SHORT TERM GOAL #5   Title Pt will demonstrate improved RUE fine motor coordination as evidenced by decreasing 9 hole peg test score to 43 secs or less.   Time 4   Period Weeks   Status New     OT SHORT TERM GOAL #6   Title assess handwriting and set goal PRN.   Time 4   Period Weeks   Status New           OT Long Term Goals - 08/17/16 1512      OT LONG TERM GOAL #1   Title Pt will verbalize understanding of ways to prevent future PD related complications and verbalize understanding of community resources.   Time 8   Period Weeks   Status New     OT LONG TERM GOAL #2   Title Pt will demonstrate ability to retrieve a lightweight object at 120 shoulder flexion with -20 elbow ext for LUE.   Baseline 115, -28   Time 8   Period Weeks   Status New     OT LONG TERM GOAL #3   Title Pt will demosntrate ability to retrieve an item at 130 shoulder flexion and -20 elbow ext for RUE.   Baseline 130, -30   Time 8   Period Weeks   Status New     OT LONG TERM GOAL #4   Title Pt will decrease 3 button / unbutton test to 90 secs or less.   Baseline 2 mins, 4 secs   Time 8   Period Weeks   Status New     OT LONG TERM GOAL #5   Title Pt will decrease PPT#2 (simulated feeding) to 16 secs or less.   Time 8   Period Weeks   Status New     OT LONG TERM GOAL #6   Title Pt will verbalize understanding of ways to keep thinking skills sharp   Time 8   Period Weeks   Status New               Plan - 09/01/16 0811    Clinical Impression Statement Pt is progressing towards goals, but continues to need reinforcement for large amplitude movement strategies.   Rehab Potential Good   OT Frequency 2x / week   OT Duration 8 weeks   OT Treatment/Interventions Self-care/ADL training;Therapeutic exercise;Cognitive remediation/compensation;Visual/perceptual remediation/compensation;Splinting;Neuromuscular  education;Moist Heat;Parrafin;Fluidtherapy;Energy conservation;Therapeutic exercises;Patient/family education;Balance training;Therapeutic activities;Passive range of motion;Manual Therapy;DME and/or AE instruction;Contrast Bath;Ultrasound;Cryotherapy   Plan  continue, review/reinforce large amplitude movements for ADLs   OT Home Exercise Plan issued PWR! seated, basic coordiantion HEP., modified quadraped, PWR! hands   Consulted and Agree with Plan of Care Patient      Patient will benefit from skilled therapeutic intervention in order to improve the following deficits and impairments:  Abnormal gait, Decreased cognition, Impaired vision/preception, Impaired flexibility, Decreased knowledge of use of DME, Decreased coordination, Decreased mobility, Impaired tone, Decreased strength, Decreased range of motion, Decreased endurance, Decreased activity tolerance, Decreased balance, Decreased knowledge of precautions, Difficulty walking, Impaired perceived functional ability, Impaired UE functional use  Visit Diagnosis: Other symptoms and signs involving the nervous system  Other symptoms and signs involving the musculoskeletal system  Other lack of coordination  Abnormal posture  Other abnormalities of gait and mobility    Problem List Patient Active Problem List   Diagnosis Date Noted  . PD (Parkinson's disease) ([place]) 08/08/2016  . Loss of weight 07/25/2016  . 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 Elyse Hsu 10/08/2012  . CKD (chronic kidney disease) stage 3, GFR 30-59 ml/min 10/08/2012  . Hypertension 08/24/2012  . Hypothyroid, s/p radioactive iodine tx for graves 2001 - managed by endocrinologist, Dr. Elyse Hsu 08/24/2012  . Hyperlipemia 08/24/2012  . Vitamin D deficiency 08/24/2012    [facility] 09/01/2016, 10:57 AM  [place] [address] [place], [place],  [address] Phone: [phone]   Fax:  [phone]  Name: Pamela Patton MRN: [id number] Date of Birth: September 20, 1946   Vianne Bulls, OTR/L [facility] [address] [place], [place]  [address] [phone] phone [phone] 09/01/16 11:01 AM

## 2016-09-01 NOTE — Patient Instructions (Signed)
Performing Daily Activities with Big Movements  Pick at least 2 activities a day and perform with BIG, DELIBERATE movements/effort.  Try different activities each day. This can make the activity easier and turn daily activities into exercise to prevent problems in the future!  If you are standing during the activity, make sure to keep feet apart and stand with good/big/PWR! UP posture.  Examples:  Dressing - Push arms in sleeves, twist when putting on jacket, push foot into pants, open hands to pull down shirt/put on socks/pull up pants  Buttoning - Open hands big (PWR! Hands) before fastening each button  Bathing - Wash/dry with long strokes with open hand  Brushing your teeth - Big, slow movements  Cutting food - Long deliberate cuts  Eating - Hold utensil in the middle, not the end  Picking up a cup/bottle - Open hand up big and get object all the way in palm  Opening jar/bottle - Move as much as you can with each turn  Putting on seatbelt - Twist when reaching  Hanging up clothes/getting clothes down from closet - Reach with big effort  Putting away groceries/dishes - Reach with big effort  Wiping counter/table - Move in big, long strokes  Stirring while cooking - Exaggerate movement  Cleaning windows - Move in big, long strokes  Sweeping - Move arms in big, long strokes  Vacuuming - Push with big movement  Folding clothes - Exaggerate arm movements  Washing car - Move in big, long strokes  Raking - Move arms in big, long strokes  Changing light bulb - Move as much as you can with each turn  Using a screwdriver - Move as much as you can with each turn  Walking into a store/restaurant - Walk with big steps, swing arms if able  Standing up from a chair/recliner/sofa - Scoot forward, lean forward, and stand with big effort

## 2016-09-07 ENCOUNTER — Ambulatory Visit: Payer: Medicare Other | Attending: Neurology | Admitting: Physical Therapy

## 2016-09-07 ENCOUNTER — Ambulatory Visit: Payer: Medicare Other | Admitting: Occupational Therapy

## 2016-09-07 DIAGNOSIS — R2689 Other abnormalities of gait and mobility: Secondary | ICD-10-CM | POA: Diagnosis present

## 2016-09-07 DIAGNOSIS — R471 Dysarthria and anarthria: Secondary | ICD-10-CM | POA: Diagnosis present

## 2016-09-07 DIAGNOSIS — R1310 Dysphagia, unspecified: Secondary | ICD-10-CM | POA: Diagnosis present

## 2016-09-07 DIAGNOSIS — R293 Abnormal posture: Secondary | ICD-10-CM

## 2016-09-07 DIAGNOSIS — R29818 Other symptoms and signs involving the nervous system: Secondary | ICD-10-CM

## 2016-09-07 DIAGNOSIS — R29898 Other symptoms and signs involving the musculoskeletal system: Secondary | ICD-10-CM | POA: Insufficient documentation

## 2016-09-07 DIAGNOSIS — R2681 Unsteadiness on feet: Secondary | ICD-10-CM

## 2016-09-07 DIAGNOSIS — R278 Other lack of coordination: Secondary | ICD-10-CM | POA: Diagnosis present

## 2016-09-07 NOTE — Therapy (Signed)
[facility] [address] [place], [place], [address] Phone: [phone]   Fax:  [phone]  Physical Therapy Treatment  Patient Details  Name: Pamela Patton MRN: [id number] Date of Birth: 02/18/1946 Referring Provider: [name], DO  Encounter Date: 09/07/2016      PT End of Session - 09/07/16 1017    Visit Number 5   Number of Visits 13   Date for PT Re-Evaluation 10/17/16   Authorization Type UHC Medicare-GCODE every 10th visit   PT Start Time 0931   PT Stop Time 1014   PT Time Calculation (min) 43 min   Activity Tolerance Patient tolerated treatment well   Behavior During Therapy [facility] for tasks assessed/performed      Past Medical History:  Diagnosis Date  . Anemia   . Arthritis   . Cancer [facility])    fibroid tumor  . Cataract    immature   . Chest pain summer 2012  . Chicken pox   . Diverticulosis    difficulty with colonoscopy 2005  . 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 Elyse Hsu 10/08/2012  . Fracture    radial/ulnar 03/2016  . GERD (gastroesophageal reflux disease)   . Heart murmur    innocent murmur  . High cholesterol   . Hypertension   . Hypothyroid, s/p radioactive iodine tx for graves 2001 08/24/2012  . Kidney disease   . UTI (urinary tract infection)   . Weight loss    eval with GI in 2016; s/p CT/MRI and PET scans    Past Surgical History:  Procedure Laterality Date  . ABDOMINAL HYSTERECTOMY  1989  . COLONOSCOPY    . DG BARIUM SWALLOW ([place] HX)     9 yrs ago per pt due to incomplete colonsocopy    There were no vitals filed for this visit.      Subjective Assessment - 09/07/16 0933    Subjective doing well; no complaints   Pertinent History DM, fracture L ulna/radius 03/2016, GERD, HTN   Patient Stated Goals Pt's goal for therapy is to help with strength and coordination.   Currently in Pain? No/denies                          [facility] Adult PT Treatment/Exercise - 09/07/16 0001      Exercises   Exercises Lumbar     Lumbar Exercises: Stretches   Active Hamstring Stretch 3 reps;30 seconds   Active Hamstring Stretch Limitations seated; bil   Quadruped Mid Back Stretch 3 reps;20 seconds   Quadruped Mid Back Stretch Limitations seated with physioball           PWR [facility]) - 09/07/16 0943    PWR! Up x20   PWR! Rock [employer]! Twist x20   PWR Step x20   Comments standing             PT Education - 09/07/16 1016    Education provided Yes   Education Details stretches   Person(s) Educated Patient   Methods Explanation;Demonstration;Handout   Comprehension Verbalized understanding;Returned demonstration          PT Short Term Goals - 09/01/16 0920      PT SHORT TERM GOAL #1   Title Pt will be independent with HEP for improved balance, posture, gait for improved functional mobility.  TARGET 09/01/16   Status Achieved     PT SHORT TERM GOAL #  2   Title Pt will perform at least 8 of 10 reps of sit<>stand transfers with minimal UE support with no posterior lean.   Status Achieved     PT SHORT TERM GOAL #3   Title Pt will improve single limb stance to at least 3 seconds bilateral lower extremities for improved step and obstacle negotiation.   Status Achieved           PT Long Term Goals - 08/21/16 1432      PT LONG TERM GOAL #1   Title Pt will verbalize understanding of fall prevention in home environment.  TARGET 10/01/16   Time 6   Period Weeks   Status New     PT LONG TERM GOAL #2   Title Pt will improve TUG and TUG manual scores to less than 10% difference, for improved dual tasking with gait.   Time 6   Period Weeks   Status New     PT LONG TERM GOAL #3   Title Pt will negotiate at least 4 steps with handrails, step through pattern, modified independently for safe stair negotiation at home.   Time 6   Period Weeks   Status New     PT  LONG TERM GOAL #4   Title Pt will ambulate outdoor surfaces, at least 1000 ft with appropriate assistive device, modified independently for improved community gait.   Time 6   Period Weeks   Status New     PT LONG TERM GOAL #5   Title Pt will verbalize plans for continued community fitness upon D/C from PT.   Time 6   Period Weeks   Status New               Plan - 09/07/16 1017    Clinical Impression Statement Pt demonstrates good carryover with PWR! exercises.  Requested stretches today so initatied some chest/back/hamstring stretches and will add PRN.  Pt progressing well towards LTGs.   PT Next Visit Plan continue PWR! moves PRN, ankle/hip/step strategies, additional stretches PRN (chest/doorway stretch and trunk rotation)   Consulted and Agree with Plan of Care Patient      Patient will benefit from skilled therapeutic intervention in order to improve the following deficits and impairments:     Visit Diagnosis: Unsteadiness on feet  Abnormal posture  Other abnormalities of gait and mobility     Problem List Patient Active Problem List   Diagnosis Date Noted  . PD (Parkinson's disease) ([place]) 08/08/2016  . Loss of weight 07/25/2016  . 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 Elyse Hsu 10/08/2012  . CKD (chronic kidney disease) stage 3, GFR 30-59 ml/min 10/08/2012  . Hypertension 08/24/2012  . Hypothyroid, s/p radioactive iodine tx for graves 2001 - managed by endocrinologist, Dr. Elyse Hsu 08/24/2012  . Hyperlipemia 08/24/2012  . Vitamin D deficiency 08/24/2012        Laureen Abrahams, PT, DPT 09/07/16 10:20 AM     [facility] [address] [place], [place], [address] Phone: [phone]   Fax:  [phone]  Name: BEAUX ENERSON MRN: [id number] Date of Birth: [birth year]

## 2016-09-07 NOTE — Therapy (Signed)
[facility] [address] [place], [place], [address] Phone: [phone]   Fax:  [phone]  Occupational Therapy Treatment  Patient Details  Name: Pamela Patton MRN: [id number] Date of Birth: 31-Oct-1945 Referring Provider: Dr Carles Collet  Encounter Date: 09/07/2016      OT End of Session - 09/07/16 1259    Visit Number 6   Number of Visits 17   Date for OT Re-Evaluation 10/15/16   Authorization Type UHC MCR   OT Start Time 0850   OT Stop Time 0930   OT Time Calculation (min) 40 min   Activity Tolerance Patient tolerated treatment well   Behavior During Therapy [facility] for tasks assessed/performed      Past Medical History:  Diagnosis Date  . Anemia   . Arthritis   . Cancer [facility])    fibroid tumor  . Cataract    immature   . Chest pain summer 2012  . Chicken pox   . Diverticulosis    difficulty with colonoscopy 2005  . 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 Elyse Hsu 10/08/2012  . Fracture    radial/ulnar 03/2016  . GERD (gastroesophageal reflux disease)   . Heart murmur    innocent murmur  . High cholesterol   . Hypertension   . Hypothyroid, s/p radioactive iodine tx for graves 2001 08/24/2012  . Kidney disease   . UTI (urinary tract infection)   . Weight loss    eval with GI in 2016; s/p CT/MRI and PET scans    Past Surgical History:  Procedure Laterality Date  . ABDOMINAL HYSTERECTOMY  1989  . COLONOSCOPY    . DG BARIUM SWALLOW ([place] HX)     9 yrs ago per pt due to incomplete colonsocopy    There were no vitals filed for this visit.      Subjective Assessment - 09/07/16 1256    Pertinent History see Epic   Patient Stated Goals to maintain indepenence   Currently in Pain? Yes        Therapist continued education regarding adapated strategies for ADLS: opening bottles, hanging up clothes, reaching for items in cabinets, taking off coat, grasp  release of a cup. Pt verbalized and returned demonstration following instruction. Flipping playing cards with right and left hands with big movements, min v.c. Arm bike x 5 mins level 1 for conditioning pt maintained 20-25 rpm                            OT Education - 09/07/16 1256    Education provided Yes   Education Details adapted strategies for ADLs.   Person(s) Educated Patient   Methods Explanation;Demonstration;Verbal cues   Comprehension Verbalized understanding;Returned demonstration          OT Short Term Goals - 08/17/16 1505      OT SHORT TERM GOAL #1   Title I with PD specific HEP.   Time 4   Period Weeks   Status New     OT SHORT TERM GOAL #2   Title Pt will verbalize understanding of adapted strategies for ADLs/ IADLs.   Time 4   Period Weeks   Status New     OT SHORT TERM GOAL #3   Title Pt will improve LUE fine motor coordiantion as evidenced by performing 9 hole peg test in 65 secs or less.   Baseline LUE 80.81 secs, RUE 48.32 secs  Time 4   Period Weeks   Status New     OT SHORT TERM GOAL #4   Title Pt will demonstrate improved ease with dressing as evidenced by performing PPT#4 (donning/ doffing jacket ) in 60 secs or less.   Time 4   Period Weeks   Status New     OT SHORT TERM GOAL #5   Title Pt will demonstrate improved RUE fine motor coordination as evidenced by decreasing 9 hole peg test score to 43 secs or less.   Time 4   Period Weeks   Status New     OT SHORT TERM GOAL #6   Title assess handwriting and set goal PRN.   Time 4   Period Weeks   Status New           OT Long Term Goals - 08/17/16 1512      OT LONG TERM GOAL #1   Title Pt will verbalize understanding of ways to prevent future PD related complications and verbalize understanding of community resources.   Time 8   Period Weeks   Status New     OT LONG TERM GOAL #2   Title Pt will demonstrate ability to retrieve a lightweight object at 120  shoulder flexion with -20 elbow ext for LUE.   Baseline 115, -28   Time 8   Period Weeks   Status New     OT LONG TERM GOAL #3   Title Pt will demosntrate ability to retrieve an item at 130 shoulder flexion and -20 elbow ext for RUE.   Baseline 130, -30   Time 8   Period Weeks   Status New     OT LONG TERM GOAL #4   Title Pt will decrease 3 button / unbutton test to 90 secs or less.   Baseline 2 mins, 4 secs   Time 8   Period Weeks   Status New     OT LONG TERM GOAL #5   Title Pt will decrease PPT#2 (simulated feeding) to 16 secs or less.   Time 8   Period Weeks   Status New     OT LONG TERM GOAL #6   Title Pt will verbalize understanding of ways to keep thinking skills sharp   Time 8   Period Weeks   Status New               Plan - 09/07/16 1256    Clinical Impression Statement Pt is progressing towards goals. She can benefit from continued reinforcement of adapted strategies.   Rehab Potential Good   OT Frequency 2x / week   OT Duration 8 weeks   Plan start checking short term goals , check on HEP's, big movments with functional activities, coordination   OT Home Exercise Plan issued PWR! seated, basic coordiantion HEP., modified quadraped, PWR! hands   Consulted and Agree with Plan of Care Patient      Patient will benefit from skilled therapeutic intervention in order to improve the following deficits and impairments:  Abnormal gait, Decreased cognition, Impaired vision/preception, Impaired flexibility, Decreased knowledge of use of DME, Decreased coordination, Decreased mobility, Impaired tone, Decreased strength, Decreased range of motion, Decreased endurance, Decreased activity tolerance, Decreased balance, Decreased knowledge of precautions, Difficulty walking, Impaired perceived functional ability, Impaired UE functional use  Visit Diagnosis: Abnormal posture  Other abnormalities of gait and mobility  Other symptoms and signs involving the nervous  system  Other symptoms and signs involving the musculoskeletal  system    Problem List Patient Active Problem List   Diagnosis Date Noted  . PD (Parkinson's disease) ([place]) 08/08/2016  . Loss of weight 07/25/2016  . 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 Elyse Hsu 10/08/2012  . CKD (chronic kidney disease) stage 3, GFR 30-59 ml/min 10/08/2012  . Hypertension 08/24/2012  . Hypothyroid, s/p radioactive iodine tx for graves 2001 - managed by endocrinologist, Dr. Elyse Hsu 08/24/2012  . Hyperlipemia 08/24/2012  . Vitamin D deficiency 08/24/2012    RINE,KATHRYN 09/07/2016, 1:00 PM  [facility] [address] [place], [place], [address] Phone: [phone]   Fax:  [phone]  Name: Pamela Patton MRN: [id number] Date of Birth: Feb 15, 1946

## 2016-09-07 NOTE — Patient Instructions (Signed)
Pectoral Stretch, Prone With Ball    SIT ON EDGE OF CHAIR with arms extended over stability ball. Roll ball out until stretch in mid back is felt. Hold __20-30_ seconds. Repeat _2-3__ times per session. Do _1-2__ sessions per day.  Copyright  VHI. All rights reserved.    HIP: Hamstrings - Short Sitting    Rest leg on raised surface. Keep knee straight. Lift chest. Hold _20-30__ seconds.  Repeat with other leg. _2-3__ reps per set, _1-2__ sets per day, _6-7__ days per week  Copyright  VHI. All rights reserved.

## 2016-09-09 ENCOUNTER — Ambulatory Visit: Payer: Medicare Other | Admitting: Physical Therapy

## 2016-09-09 ENCOUNTER — Ambulatory Visit: Payer: Medicare Other | Admitting: Occupational Therapy

## 2016-09-09 DIAGNOSIS — R2689 Other abnormalities of gait and mobility: Secondary | ICD-10-CM

## 2016-09-09 DIAGNOSIS — R29898 Other symptoms and signs involving the musculoskeletal system: Secondary | ICD-10-CM

## 2016-09-09 DIAGNOSIS — R2681 Unsteadiness on feet: Secondary | ICD-10-CM

## 2016-09-09 DIAGNOSIS — R278 Other lack of coordination: Secondary | ICD-10-CM

## 2016-09-09 DIAGNOSIS — R293 Abnormal posture: Secondary | ICD-10-CM

## 2016-09-09 DIAGNOSIS — R29818 Other symptoms and signs involving the nervous system: Secondary | ICD-10-CM

## 2016-09-09 NOTE — Therapy (Signed)
[facility] [address] [place], [place], [address] Phone: [phone]   Fax:  [phone]  Physical Therapy Treatment  Patient Details  Name: Pamela Patton MRN: [id number] Date of Birth: [birth year] Referring Provider: [name], DO  Encounter Date: 09/09/2016      PT End of Session - 09/09/16 0925    Visit Number 6   Number of Visits 13   Date for PT Re-Evaluation 10/17/16   Authorization Type UHC Medicare-GCODE every 10th visit   PT Start Time 0845   PT Stop Time 0925   PT Time Calculation (min) 40 min   Activity Tolerance Patient tolerated treatment well   Behavior During Therapy [facility] for tasks assessed/performed      Past Medical History:  Diagnosis Date  . Anemia   . Arthritis   . Cancer [facility])    fibroid tumor  . Cataract    immature   . Chest pain summer 2012  . Chicken pox   . Diverticulosis    difficulty with colonoscopy 2005  . 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 Elyse Hsu 10/08/2012  . Fracture    radial/ulnar 03/2016  . GERD (gastroesophageal reflux disease)   . Heart murmur    innocent murmur  . High cholesterol   . Hypertension   . Hypothyroid, s/p radioactive iodine tx for graves 2001 08/24/2012  . Kidney disease   . UTI (urinary tract infection)   . Weight loss    eval with GI in 2016; s/p CT/MRI and PET scans    Past Surgical History:  Procedure Laterality Date  . ABDOMINAL HYSTERECTOMY  1989  . COLONOSCOPY    . DG BARIUM SWALLOW ([place] HX)     9 yrs ago per pt due to incomplete colonsocopy    There were no vitals filed for this visit.      Subjective Assessment - 09/09/16 0848    Subjective doing well, no changes today.  practiced the cards yesterday and feels it's been helping.   Patient Stated Goals Pt's goal for therapy is to help with strength and coordination.   Currently in Pain? No/denies                          [facility] Adult PT Treatment/Exercise - 09/09/16 0850      Lumbar Exercises: Aerobic   Stationary Bike --     Lumbar Exercises: Seated   Sit to Stand 20 reps   Sit to Stand Limitations no UE support; cues for ant weight shift     Knee/Hip Exercises: Aerobic   Nustep NuStep L6 x 8 min with goal to keep steps/min > 50 and cues for big reciprocal movement     Knee/Hip Exercises: Standing   Hip Flexion Both;2 sets;10 reps;Knee bent   Hip Flexion Limitations 2#   Hip Abduction Both;2 sets;10 reps;Knee straight   Abduction Limitations 2#   Hip Extension Both;2 sets;10 reps;Knee straight   Extension Limitations 2#     Shoulder Exercises: Stretch   Other Shoulder Stretches doorway stretch low and mid level 2x30 sec each           PWR [facility]) - 09/09/16 0840    PWR! exercises Moves in [place]! Up x10   PWR! Rock x10   PWR! Twist x10   PWR! Step x10   Comments modified positioning, min v.c. and demonstration for large amplitude movements  PT Short Term Goals - 09/01/16 0920      PT SHORT TERM GOAL #1   Title Pt will be independent with HEP for improved balance, posture, gait for improved functional mobility.  TARGET 09/01/16   Status Achieved     PT SHORT TERM GOAL #2   Title Pt will perform at least 8 of 10 reps of sit<>stand transfers with minimal UE support with no posterior lean.   Status Achieved     PT SHORT TERM GOAL #3   Title Pt will improve single limb stance to at least 3 seconds bilateral lower extremities for improved step and obstacle negotiation.   Status Achieved           PT Long Term Goals - 08/21/16 1432      PT LONG TERM GOAL #1   Title Pt will verbalize understanding of fall prevention in home environment.  TARGET 10/01/16   Time 6   Period Weeks   Status New     PT LONG TERM GOAL #2   Title Pt will improve TUG and TUG manual scores to less than 10% difference, for improved  dual tasking with gait.   Time 6   Period Weeks   Status New     PT LONG TERM GOAL #3   Title Pt will negotiate at least 4 steps with handrails, step through pattern, modified independently for safe stair negotiation at home.   Time 6   Period Weeks   Status New     PT LONG TERM GOAL #4   Title Pt will ambulate outdoor surfaces, at least 1000 ft with appropriate assistive device, modified independently for improved community gait.   Time 6   Period Weeks   Status New     PT LONG TERM GOAL #5   Title Pt will verbalize plans for continued community fitness upon D/C from PT.   Time 6   Period Weeks   Status New               Plan - 09/09/16 0925    Clinical Impression Statement Pt with improved sit to/from stand technique with cues for ant weigth shift demonstrating decreased posterior lean.  Progressing well towards goals.    PT Treatment/Interventions ADLs/Self Care Home Management;Functional mobility training;Gait training;DME Instruction;Therapeutic activities;Therapeutic exercise;Balance training;Neuromuscular re-education;Patient/family education;Stair training   PT Next Visit Plan continue PWR! moves PRN, ankle/hip/step strategies, additional stretches PRN (chest/doorway stretch and trunk rotation)   Consulted and Agree with Plan of Care Patient      Patient will benefit from skilled therapeutic intervention in order to improve the following deficits and impairments:  Abnormal gait, Decreased balance, Decreased mobility, Decreased strength, Difficulty walking, Impaired tone, Postural dysfunction  Visit Diagnosis: Unsteadiness on feet  Abnormal posture  Other abnormalities of gait and mobility     Problem List Patient Active Problem List   Diagnosis Date Noted  . PD (Parkinson's disease) ([place]) 08/08/2016  . Loss of weight 07/25/2016  . 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 Elyse Hsu 10/08/2012  . CKD  (chronic kidney disease) stage 3, GFR 30-59 ml/min 10/08/2012  . Hypertension 08/24/2012  . Hypothyroid, s/p radioactive iodine tx for graves 2001 - managed by endocrinologist, Dr. Elyse Hsu 08/24/2012  . Hyperlipemia 08/24/2012  . Vitamin D deficiency 08/24/2012       Laureen Abrahams, PT, DPT 09/09/16 9:27 AM    [facility] [address] [place],  [place], [address] Phone: [phone]   Fax:  [phone]  Name: TERRIAH ZIELSDORF MRN: [id number] Date of Birth: 04/03/1946

## 2016-09-09 NOTE — Therapy (Signed)
[facility] [address] [place], [place], [address] Phone: [phone]   Fax:  [phone]  Occupational Therapy Treatment  Patient Details  Name: Pamela Patton MRN: [id number] Date of Birth: 17-Nov-1945 Referring Provider: Dr Carles Collet  Encounter Date: 09/09/2016      OT End of Session - 09/09/16 0821    Visit Number 7   Number of Visits 17   Date for OT Re-Evaluation 10/15/16   Authorization Type UHC MCR   OT Start Time 0805   OT Stop Time 0845   OT Time Calculation (min) 40 min   Activity Tolerance Patient tolerated treatment well   Behavior During Therapy [facility] for tasks assessed/performed      Past Medical History:  Diagnosis Date  . Anemia   . Arthritis   . Cancer [facility])    fibroid tumor  . Cataract    immature   . Chest pain summer 2012  . Chicken pox   . Diverticulosis    difficulty with colonoscopy 2005  . 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 Elyse Hsu 10/08/2012  . Fracture    radial/ulnar 03/2016  . GERD (gastroesophageal reflux disease)   . Heart murmur    innocent murmur  . High cholesterol   . Hypertension   . Hypothyroid, s/p radioactive iodine tx for graves 2001 08/24/2012  . Kidney disease   . UTI (urinary tract infection)   . Weight loss    eval with GI in 2016; s/p CT/MRI and PET scans    Past Surgical History:  Procedure Laterality Date  . ABDOMINAL HYSTERECTOMY  1989  . COLONOSCOPY    . DG BARIUM SWALLOW ([place] [facility]X)     9 yrs ago per pt due to incomplete colonsocopy    There were no vitals filed for this visit.      Subjective Assessment - 09/09/16 1305    Subjective  Pt reports the exercises are helping   Pertinent History see Epic   Patient Stated Goals to maintain indepenence   Currently in Pain? No/denies              Education provided regarding handwriting strategies for the future. Pt demonstrates good  legibility and letter size.  Reviewed flipping and dealing playing cards with bilateral UE's min -mod  V.c for finger ext. Arm bike x 5 mins level 1 pt maintained 20-25 rpm            PWR [facility]) - 09/09/16 0840    PWR! exercises Moves in [place]! Up x10   PWR! Rock x10   PWR! Twist x10   PWR! Step x10   Comments modified positioning, min v.c. and demonstration for large amplitude movements               OT Short Term Goals - 09/09/16 [id number]      OT SHORT TERM GOAL #1   Title I with PD specific HEP.   Time 4   Period Weeks   Status Achieved     OT SHORT TERM GOAL #2   Title Pt will verbalize understanding of adapted strategies for ADLs/ IADLs.   Time 4   Period Weeks   Status On-going     OT SHORT TERM GOAL #3   Title Pt will improve LUE fine motor coordiantion as evidenced by performing 9 hole peg test in 65 secs or less.   Baseline LUE 80.81 secs, RUE 48.32 secs  Time 4   Period Weeks   Status New     OT SHORT TERM GOAL #4   Title Pt will demonstrate improved ease with dressing as evidenced by performing PPT#4 (donning/ doffing jacket ) in 60 secs or less.   Time 4   Period Weeks   Status New     OT SHORT TERM GOAL #5   Title Pt will demonstrate improved RUE fine motor coordination as evidenced by decreasing 9 hole peg test score to 43 secs or less.   Time 4   Period Weeks   Status New     OT SHORT TERM GOAL #6   Title assess handwriting and set goal PRN.   Time 4   Period Weeks   Status Deferred  good size and legibility, pt provided with hand writing suggestions in case she nees tham in the future           OT Long Term Goals - 08/17/16 1512      OT LONG TERM GOAL #1   Title Pt will verbalize understanding of ways to prevent future PD related complications and verbalize understanding of community resources.   Time 8   Period Weeks   Status New     OT LONG TERM GOAL #2   Title Pt will demonstrate ability to retrieve a  lightweight object at 120 shoulder flexion with -20 elbow ext for LUE.   Baseline 115, -28   Time 8   Period Weeks   Status New     OT LONG TERM GOAL #3   Title Pt will demosntrate ability to retrieve an item at 130 shoulder flexion and -20 elbow ext for RUE.   Baseline 130, -30   Time 8   Period Weeks   Status New     OT LONG TERM GOAL #4   Title Pt will decrease 3 button / unbutton test to 90 secs or less.   Baseline 2 mins, 4 secs   Time 8   Period Weeks   Status New     OT LONG TERM GOAL #5   Title Pt will decrease PPT#2 (simulated feeding) to 16 secs or less.   Time 8   Period Weeks   Status New     OT LONG TERM GOAL #6   Title Pt will verbalize understanding of ways to keep thinking skills sharp   Time 8   Period Weeks   Status New               Plan - 09/09/16 0830    Clinical Impression Statement Pt is progressing towards goals for PD specific HEP and adapted strategies for ADLS.   Rehab Potential Good   OT Frequency 2x / week   OT Duration 8 weeks   OT Treatment/Interventions Self-care/ADL training;Therapeutic exercise;Cognitive remediation/compensation;Visual/perceptual remediation/compensation;Splinting;Neuromuscular education;Moist Heat;Parrafin;Fluidtherapy;Energy conservation;Therapeutic exercises;Patient/family education;Balance training;Therapeutic activities;Passive range of motion;Manual Therapy;DME and/or AE instruction;Contrast Bath;Ultrasound;Cryotherapy   Plan educate in PWR! supine basic 4,   start checking  short term goals, coordination, big movements with functional reaching   OT Home Exercise Plan issued PWR! seated, basic coordiantion HEP., modified quadraped, PWR! hands   Consulted and Agree with Plan of Care Patient      Patient will benefit from skilled therapeutic intervention in order to improve the following deficits and impairments:  Abnormal gait, Decreased cognition, Impaired vision/preception, Impaired flexibility, Decreased  knowledge of use of DME, Decreased coordination, Decreased mobility, Impaired tone, Decreased strength, Decreased range of motion, Decreased endurance,  Decreased activity tolerance, Decreased balance, Decreased knowledge of precautions, Difficulty walking, Impaired perceived functional ability, Impaired UE functional use  Visit Diagnosis: Other lack of coordination  Abnormal posture  Other abnormalities of gait and mobility  Other symptoms and signs involving the nervous system  Other symptoms and signs involving the musculoskeletal system    Problem List Patient Active Problem List   Diagnosis Date Noted  . PD (Parkinson's disease) ([place]) 08/08/2016  . Loss of weight 07/25/2016  . 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 Elyse Hsu 10/08/2012  . CKD (chronic kidney disease) stage 3, GFR 30-59 ml/min 10/08/2012  . Hypertension 08/24/2012  . Hypothyroid, s/p radioactive iodine tx for graves 2001 - managed by endocrinologist, Dr. Elyse Hsu 08/24/2012  . Hyperlipemia 08/24/2012  . Vitamin D deficiency 08/24/2012    Cornell Gaber 09/09/2016, 1:09 PM  [place] [facility] [address] [place], [place], [address] Phone: [phone]   Fax:  [phone]  Name: KLARISSA SIEFER MRN: [id number] Date of Birth: 1945/11/28

## 2016-09-14 ENCOUNTER — Encounter: Payer: Medicare Other | Admitting: Occupational Therapy

## 2016-09-14 ENCOUNTER — Ambulatory Visit: Payer: Medicare Other | Admitting: Physical Therapy

## 2016-09-14 ENCOUNTER — Ambulatory Visit: Payer: Medicare Other | Admitting: Occupational Therapy

## 2016-09-14 ENCOUNTER — Encounter: Payer: Self-pay | Admitting: Physical Therapy

## 2016-09-14 DIAGNOSIS — R278 Other lack of coordination: Secondary | ICD-10-CM

## 2016-09-14 DIAGNOSIS — R2681 Unsteadiness on feet: Secondary | ICD-10-CM

## 2016-09-14 DIAGNOSIS — R29818 Other symptoms and signs involving the nervous system: Secondary | ICD-10-CM

## 2016-09-14 DIAGNOSIS — R29898 Other symptoms and signs involving the musculoskeletal system: Secondary | ICD-10-CM

## 2016-09-14 DIAGNOSIS — R2689 Other abnormalities of gait and mobility: Secondary | ICD-10-CM

## 2016-09-14 DIAGNOSIS — R293 Abnormal posture: Secondary | ICD-10-CM

## 2016-09-14 NOTE — Therapy (Signed)
[facility] [address] [place], [place], [address] Phone: [phone]   Fax:  [phone]  Physical Therapy Treatment  Patient Details  Name: ZAKYIAH STONEBARGER MRN: [id number] Date of Birth: April 14, 1946 Referring Provider: [name], DO  Encounter Date: 09/14/2016      PT End of Session - 09/14/16 1054    Visit Number 7   Number of Visits 13   Date for PT Re-Evaluation 10/17/16   Authorization Type UHC Medicare-GCODE every 10th visit   PT Start Time 1016   PT Stop Time 1100   PT Time Calculation (min) 44 min   Equipment Utilized During Treatment Gait belt   Activity Tolerance Patient tolerated treatment well   Behavior During Therapy [facility] for tasks assessed/performed      Past Medical History:  Diagnosis Date  . Anemia   . Arthritis   . Cancer [facility])    fibroid tumor  . Cataract    immature   . Chest pain summer 2012  . Chicken pox   . Diverticulosis    difficulty with colonoscopy 2005  . 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 Elyse Hsu 10/08/2012  . Fracture    radial/ulnar 03/2016  . GERD (gastroesophageal reflux disease)   . Heart murmur    innocent murmur  . High cholesterol   . Hypertension   . Hypothyroid, s/p radioactive iodine tx for graves 2001 08/24/2012  . Kidney disease   . UTI (urinary tract infection)   . Weight loss    eval with GI in 2016; s/p CT/MRI and PET scans    Past Surgical History:  Procedure Laterality Date  . ABDOMINAL HYSTERECTOMY  1989  . COLONOSCOPY    . DG BARIUM SWALLOW ([place] [facility])     9 yrs ago per pt due to incomplete colonsocopy    There were no vitals filed for this visit.      Subjective Assessment - 09/14/16 1018    Subjective Pt reports that she feels like she is doing well with her walkilng and balance in the community.   Currently in Pain? No/denies                         [facility] Adult  PT Treatment/Exercise - 09/14/16 0001      Ambulation/Gait   Ambulation/Gait Yes   Ambulation/Gait Assistance 5: Supervision   Ambulation/Gait Assistance Details working on heel- toe gait pattern and gait speed   Ambulation Distance (Feet) 400 Feet   Assistive device None   Gait Pattern Decreased arm swing - right;Decreased arm swing - left;Decreased trunk rotation;Trunk flexed;Step-through pattern;Decreased step length - right;Decreased step length - left;Poor foot clearance - left;Poor foot clearance - right   Ambulation Surface Level;Unlevel;Indoor;Outdoor;Paved;Gravel;Grass   Stairs Yes   Stairs Assistance 6: Modified independent (Device/Increase time)   Stair Management Technique One rail Right;Alternating pattern   Number of Stairs 4   Ramp 6: Modified independent (Device)   Ramp Details (indicate cue type and reason) no LOB   Curb 6: Modified independent (Device/increase time)   Curb Details (indicate cue type and reason) No LOB     Knee/Hip Exercises: Aerobic   Nustep NuStep L6 x 8 min with goal to keep steps/min > 50 and cues for big reciprocal movement           PWR [facility]) - 09/14/16 1044 STANDING   PWR! Twist x20  Balance Exercises - 09/14/16 1043      Balance Exercises: Standing   Turning Both;10 reps  Progressing from stepping to continuing to walk forward maintaining speed           PT Education - 09/14/16 1053    Education provided Yes   Education Details Importance on heel-toe gait pattern and practising the ability to turn for decreased fall risk.          PT Short Term Goals - 09/01/16 0920      PT SHORT TERM GOAL #1   Title Pt will be independent with HEP for improved balance, posture, gait for improved functional mobility.  TARGET 09/01/16   Status Achieved     PT SHORT TERM GOAL #2   Title Pt will perform at least 8 of 10 reps of sit<>stand transfers with minimal UE support with no posterior lean.   Status Achieved     PT  SHORT TERM GOAL #3   Title Pt will improve single limb stance to at least 3 seconds bilateral lower extremities for improved step and obstacle negotiation.   Status Achieved           PT Long Term Goals - 08/21/16 1432      PT LONG TERM GOAL #1   Title Pt will verbalize understanding of fall prevention in home environment.  TARGET 10/01/16   Time 6   Period Weeks   Status New     PT LONG TERM GOAL #2   Title Pt will improve TUG and TUG manual scores to less than 10% difference, for improved dual tasking with gait.   Time 6   Period Weeks   Status New     PT LONG TERM GOAL #3   Title Pt will negotiate at least 4 steps with handrails, step through pattern, modified independently for safe stair negotiation at home.   Time 6   Period Weeks   Status New     PT LONG TERM GOAL #4   Title Pt will ambulate outdoor surfaces, at least 1000 ft with appropriate assistive device, modified independently for improved community gait.   Time 6   Period Weeks   Status New     PT LONG TERM GOAL #5   Title Pt will verbalize plans for continued community fitness upon D/C from PT.   Time 6   Period Weeks   Status New               Plan - 09/14/16 1055    Clinical Impression Statement Treatment emphasized practising functional turns, community gait, and reviewing PWR! Moves (standing twist); pt performed well with no LOB and no external support needed.  Cues given to move head and eyes first before turning to move.   PT Treatment/Interventions ADLs/Self Care Home Management;Functional mobility training;Gait training;DME Instruction;Therapeutic activities;Therapeutic exercise;Balance training;Neuromuscular re-education;Patient/family education;Stair training   PT Next Visit Plan continue PWR! moves PRN, ankle/hip/step strategies, additional stretches PRN (chest/doorway stretch and trunk rotation)   Consulted and Agree with Plan of Care Patient      Patient will benefit from skilled  therapeutic intervention in order to improve the following deficits and impairments:  Abnormal gait, Decreased balance, Decreased mobility, Decreased strength, Difficulty walking, Impaired tone, Postural dysfunction  Visit Diagnosis: Unsteadiness on feet  Abnormal posture  Other abnormalities of gait and mobility     Problem List Patient Active Problem List   Diagnosis Date Noted  . PD (Parkinson's disease) ([place]) 08/08/2016  .  Loss of weight 07/25/2016  . 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 Elyse Hsu 10/08/2012  . CKD (chronic kidney disease) stage 3, GFR 30-59 ml/min 10/08/2012  . Hypertension 08/24/2012  . Hypothyroid, s/p radioactive iodine tx for graves 2001 - managed by endocrinologist, Dr. Elyse Hsu 08/24/2012  . Hyperlipemia 08/24/2012  . Vitamin D deficiency 08/24/2012    Bjorn Loser, PTA  09/14/16, 3:54 PM [facility] [address] [place], [place], [address] Phone: [phone]   Fax:  [phone]  Name: JENNIFERLEE TREMONT MRN: [id number] Date of Birth: [birth year]

## 2016-09-14 NOTE — Therapy (Signed)
[facility] [address] [place], [place], [address] Phone: [phone]   Fax:  [phone]  Occupational Therapy Treatment  Patient Details  Name: Pamela Patton MRN: [id number] Date of Birth: Sep 20, 1946 Referring Provider: Dr Carles Collet  Encounter Date: 09/14/2016      OT End of Session - 09/14/16 1021    Visit Number 8   Number of Visits 17   Date for OT Re-Evaluation 10/15/16   Authorization Type UHC MCR   OT Start Time 0915   OT Stop Time 1015   OT Time Calculation (min) 60 min   Activity Tolerance Patient tolerated treatment well      Past Medical History:  Diagnosis Date  . Anemia   . Arthritis   . Cancer [facility])    fibroid tumor  . Cataract    immature   . Chest pain summer 2012  . Chicken pox   . Diverticulosis    difficulty with colonoscopy 2005  . 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 Elyse Hsu 10/08/2012  . Fracture    radial/ulnar 03/2016  . GERD (gastroesophageal reflux disease)   . Heart murmur    innocent murmur  . High cholesterol   . Hypertension   . Hypothyroid, s/p radioactive iodine tx for graves 2001 08/24/2012  . Kidney disease   . UTI (urinary tract infection)   . Weight loss    eval with GI in 2016; s/p CT/MRI and PET scans    Past Surgical History:  Procedure Laterality Date  . ABDOMINAL HYSTERECTOMY  1989  . COLONOSCOPY    . DG BARIUM SWALLOW ([place] [facility]X)     9 yrs ago per pt due to incomplete colonsocopy    There were no vitals filed for this visit.      Subjective Assessment - 09/14/16 0919    Subjective  I always feel good after these ex's   Pertinent History see Epic   Patient Stated Goals to maintain indepenence   Currently in Pain? No/denies                      OT Treatments/Exercises (OP) - 09/14/16 0001      ADLs   ADL Comments Reviewed incorporating strategies and large amplitude movements into  self care tasks. Practiced donning/doffing sweater/jacket and discussed use of PWR! Hand flicks before and b/t fine motor tasks. Also discussed medication and avoiding protein foods 30 minutes prior to taking meds.      Neurological Re-education Exercises   Other Exercises 1 PWR! Supine basic 4 with cues for large ampitude movements. Pt demo each x 20 reps. Will need review                OT Education - 09/14/16 0948    Education provided Yes   Education Details PWR! Supine basic 4   Person(s) Educated Patient   Methods Explanation;Demonstration;Verbal cues   Comprehension Verbalized understanding;Returned demonstration;Verbal cues required          OT Short Term Goals - 09/14/16 1021      OT SHORT TERM GOAL #1   Title I with PD specific HEP.   Time 4   Period Weeks   Status Achieved     OT SHORT TERM GOAL #2   Title Pt will verbalize understanding of adapted strategies for ADLs/ IADLs.   Time 4   Period Weeks   Status Achieved     OT SHORT  TERM GOAL #3   Title Pt will improve LUE fine motor coordiantion as evidenced by performing 9 hole peg test in 65 secs or less.   Baseline LUE 80.81 secs, RUE 48.32 secs   Time 4   Period Weeks   Status New     OT SHORT TERM GOAL #4   Title Pt will demonstrate improved ease with dressing as evidenced by performing PPT#4 (donning/ doffing jacket ) in 60 secs or less.   Time 4   Period Weeks   Status New     OT SHORT TERM GOAL #5   Title Pt will demonstrate improved RUE fine motor coordination as evidenced by decreasing 9 hole peg test score to 43 secs or less.   Time 4   Period Weeks   Status New     OT SHORT TERM GOAL #6   Title assess handwriting and set goal PRN.   Time 4   Period Weeks   Status Deferred  good size and legibility, pt provided with hand writing suggestions in case she nees tham in the future           OT Long Term Goals - 08/17/16 1512      OT LONG TERM GOAL #1   Title Pt will verbalize  understanding of ways to prevent future PD related complications and verbalize understanding of community resources.   Time 8   Period Weeks   Status New     OT LONG TERM GOAL #2   Title Pt will demonstrate ability to retrieve a lightweight object at 120 shoulder flexion with -20 elbow ext for LUE.   Baseline 115, -28   Time 8   Period Weeks   Status New     OT LONG TERM GOAL #3   Title Pt will demosntrate ability to retrieve an item at 130 shoulder flexion and -20 elbow ext for RUE.   Baseline 130, -30   Time 8   Period Weeks   Status New     OT LONG TERM GOAL #4   Title Pt will decrease 3 button / unbutton test to 90 secs or less.   Baseline 2 mins, 4 secs   Time 8   Period Weeks   Status New     OT LONG TERM GOAL #5   Title Pt will decrease PPT#2 (simulated feeding) to 16 secs or less.   Time 8   Period Weeks   Status New     OT LONG TERM GOAL #6   Title Pt will verbalize understanding of ways to keep thinking skills sharp   Time 8   Period Weeks   Status New               Plan - 09/14/16 1022    Clinical Impression Statement Pt met STG #2. Pt progressing towards goals and adaptive strategies for ADLS.    Rehab Potential Good   OT Frequency 2x / week   OT Duration 8 weeks   OT Treatment/Interventions Self-care/ADL training;Therapeutic exercise;Cognitive remediation/compensation;Visual/perceptual remediation/compensation;Splinting;Neuromuscular education;Moist Heat;Parrafin;Fluidtherapy;Energy conservation;Therapeutic exercises;Patient/family education;Balance training;Therapeutic activities;Passive range of motion;Manual Therapy;DME and/or AE instruction;Contrast Bath;Ultrasound;Cryotherapy   Plan assess remaining STG's, issue and assist in exercise flowsheet chart, coordination with big movements/functional reaching (following session: review PWR! Supine)    OT Home Exercise Plan issued PWR! seated, basic coordiantion HEP., modified quadraped, PWR! hands    Consulted and Agree with Plan of Care Patient      Patient will benefit from skilled therapeutic intervention  in order to improve the following deficits and impairments:  Abnormal gait, Decreased cognition, Impaired vision/preception, Impaired flexibility, Decreased knowledge of use of DME, Decreased coordination, Decreased mobility, Impaired tone, Decreased strength, Decreased range of motion, Decreased endurance, Decreased activity tolerance, Decreased balance, Decreased knowledge of precautions, Difficulty walking, Impaired perceived functional ability, Impaired UE functional use  Visit Diagnosis: Other symptoms and signs involving the nervous system  Other symptoms and signs involving the musculoskeletal system  Other lack of coordination    Problem List Patient Active Problem List   Diagnosis Date Noted  . PD (Parkinson's disease) ([place]) 08/08/2016  . Loss of weight 07/25/2016  . 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 Elyse Hsu 10/08/2012  . CKD (chronic kidney disease) stage 3, GFR 30-59 ml/min 10/08/2012  . Hypertension 08/24/2012  . Hypothyroid, s/p radioactive iodine tx for graves 2001 - managed by endocrinologist, Dr. Elyse Hsu 08/24/2012  . Hyperlipemia 08/24/2012  . Vitamin D deficiency 08/24/2012    Carey Bullocks, OTR/L 09/14/2016, 10:24 AM  [facility] [address] [place], [place], [address] Phone: [phone]   Fax:  [phone]  Name: SHAVANNA FURNARI MRN: [id number] Date of Birth: January 27, 1946

## 2016-09-15 ENCOUNTER — Encounter: Payer: Self-pay | Admitting: Gastroenterology

## 2016-09-15 ENCOUNTER — Ambulatory Visit (INDEPENDENT_AMBULATORY_CARE_PROVIDER_SITE_OTHER): Payer: Medicare Other | Admitting: Gastroenterology

## 2016-09-15 VITALS — BP 112/56 | HR 62 | Ht 65.75 in | Wt 143.0 lb

## 2016-09-15 DIAGNOSIS — D126 Benign neoplasm of colon, unspecified: Secondary | ICD-10-CM | POA: Diagnosis not present

## 2016-09-15 DIAGNOSIS — K3189 Other diseases of stomach and duodenum: Secondary | ICD-10-CM | POA: Diagnosis not present

## 2016-09-15 DIAGNOSIS — K31A0 Gastric intestinal metaplasia, unspecified: Secondary | ICD-10-CM

## 2016-09-15 NOTE — Patient Instructions (Signed)
If you are age 72 or older, your body mass index should be between 23-30. Your Body mass index is 23.26 kg/m. If this is out of the aforementioned range listed, please consider follow up with your Primary Care Provider.  If you are age 49 or younger, your body mass index should be between 19-25. Your Body mass index is 23.26 kg/m. If this is out of the aformentioned range listed, please consider follow up with your Primary Care Provider.   Please follow up with Dr Havery Moros next fall ( we will inform you when it is time to schedule).  Thank you.

## 2016-09-15 NOTE — Progress Notes (Signed)
HPI :  70 y/o female here for a follow up visit. I initially saw her in September for significant weight loss with mild normocytic anemia.  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 They are planning on monitoring, no plans for surgery at this time.  She has not lost any further weight since I have seen her, her weight is stable. She reports she has been eating well without complaints. No abdominal pains. Brother had pancreatic cancer, sister with liver cancer. No prior gastric cancer in the family.   Since her last visit she was diagnosed with Parkinson's and started on Sinemet.   Past Medical History:  Diagnosis Date  . Anemia   . Arthritis   . Cancer [facility])    fibroid tumor  . Cataract    immature   . Chest pain summer 2012  . Chicken pox   . Diverticulosis    difficulty with colonoscopy 2005  . 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 Elyse Hsu 10/08/2012  . Fracture    radial/ulnar 03/2016  . GERD (gastroesophageal reflux disease)   . Heart murmur    innocent murmur  . High cholesterol   . Hypertension   . Hypothyroid, s/p radioactive iodine tx for graves 2001 08/24/2012  . Intestinal metaplasia of gastric mucosa   . Kidney disease   . Renal cancer ([place])   . UTI (urinary tract infection)   . Weight loss    eval with GI in 2016; s/p CT/MRI and PET scans     Past Surgical History:  Procedure  Laterality Date  . ABDOMINAL HYSTERECTOMY  1989  . COLONOSCOPY    . DG BARIUM SWALLOW ([place] [facility])     9 yrs ago per pt due to incomplete colonsocopy   Family History  Problem Relation Age of Onset  . Cancer Mother     liver  . Liver cancer Mother   . Stroke Father 59  . Diabetes Father   . Hypertension Father   . Diabetes Maternal Grandmother   . Prostate cancer Maternal Grandfather   . Pancreatic cancer Brother   . Diabetes Sister   . Cancer Sister   . Diabetes Sister   . Colon polyps Neg Hx   . Colon cancer Neg Hx   . Esophageal cancer Neg Hx   . Rectal cancer Neg Hx   . Stomach cancer Neg Hx    Social History  Substance Use Topics  . Smoking status: Never Smoker  . Smokeless tobacco: Never Used  . Alcohol use No   Current Outpatient Prescriptions  Medication Sig Dispense Refill  . AMLODIPINE BESYLATE PO Take 10 mg by mouth daily.     Marland Kitchen aspirin 81 MG tablet Take 81 mg by mouth daily.    . ATENOLOL PO Take 50 mg by mouth 2 (two) times daily.     Marland Kitchen atorvastatin (LIPITOR) 40 MG tablet Take 40 mg by mouth daily.    . Calcium Carbonate-Vitamin D (CALTRATE 600+D)  600-400 MG-UNIT per tablet Take 1 tablet by mouth daily.    . carbidopa-levodopa (SINEMET IR) 25-100 MG tablet Take 1 tablet by mouth 3 (three) times daily. 90 tablet 2  . HUMALOG KWIKPEN 100 UNIT/ML KiwkPen     . hydrochlorothiazide (HYDRODIURIL) 25 MG tablet Take 25 mg by mouth daily.     . insulin glargine (LANTUS) 100 UNIT/ML injection Inject 25 Units into the skin at bedtime.     Marland Kitchen levothyroxine (SYNTHROID, LEVOTHROID) 75 MCG tablet Take 75 mcg by mouth daily.    Marland Kitchen METFORMIN HCL PO Take 500 mg by mouth 2 (two) times daily.     . Pioglitazone HCl (ACTOS PO) Take 45 mg by mouth daily.     . Valsartan (DIOVAN PO) Take 320 mg by mouth daily.      Current Facility-Administered Medications  Medication Dose Route Frequency Provider Last Rate Last Dose  . 0.9 %  sodium chloride infusion  500 mL Intravenous Continuous  Manus Gunning, MD       No Known Allergies   Review of Systems: All systems reviewed and negative except where noted in HPI.   Lab Results  Component Value Date   WBC 6.3 06/03/2016   HGB 11.3 (L) 06/03/2016   HCT 33.8 (L) 06/03/2016   MCV 88.3 06/03/2016   PLT 220.0 06/03/2016    Lab Results  Component Value Date   CREATININE 1.33 (H) 06/03/2016   BUN 23 06/03/2016   NA 145 06/03/2016   K 4.0 06/03/2016   CL 106 06/03/2016   CO2 31 06/03/2016    Lab Results  Component Value Date   ALT 10 06/03/2016   AST 13 06/03/2016   ALKPHOS 58 06/03/2016   BILITOT 0.6 06/03/2016    Lab Results  Component Value Date   IRON 106 06/03/2016   FERRITIN 46.8 06/03/2016     Physical Exam: BP (!) 112/56   Pulse 62   Ht 5' 5.75" (1.67 m)   Wt 143 lb (64.9 kg)   BMI 23.26 kg/m  Constitutional: Pleasant,well-developed, female in no acute distress. HEENT: Normocephalic and atraumatic. Conjunctivae are normal. No scleral icterus. Neck supple.  Cardiovascular: Normal rate, regular rhythm.  Pulmonary/chest: Effort normal and breath sounds normal. No wheezing, rales or rhonchi. Abdominal: Soft, nondistended, nontender. There are no masses palpable. Extremities: no edema Lymphadenopathy: No cervical adenopathy noted. Neurological: Alert and oriented to person place and time. Skin: Skin is warm and dry. No rashes noted. Psychiatric: Normal mood and affect. Behavior is normal.   ASSESSMENT AND PLAN: 70 year old female initially presented with weight loss which led to extensive imaging and endoscopy revealing a diagnosis of small renal malignancy for which she is being followed by urology. Otherwise no clear cause for her weight loss was noted, patient thinks this may been due to poor eating habits at the time. She has since been eating well and has had no further weight loss.  Discuss results of endoscopy with her. She had gastric intestinal metaplasia noted on multiple  biopsies of her stomach. We discussed this finding and potential increased risk for gastric cancer. She has no family history or other risk factors for gastric cancer. Discussed how there are no clear guidelines and long-term management for this condition in the [place] however in [place] and [place] surveillance endoscopy is recommended. I offered her surveillance endoscopy with mapping procedure within the next year or so if she is interested in this to help clarify extent and type of gastric intestinal  metaplasia to further risk stratify her. She was interested in doing this however wish to wait about a year from her last exam given her other issues at this time which is reasonable. Otherwise regarding her colonoscopy should one small adenoma removed, she may consider surveillance colonoscopy in 5 years depending on status of her health.  Cypress Cellar, MD [facility] Pager [phone]

## 2016-09-16 ENCOUNTER — Ambulatory Visit: Payer: Medicare Other | Admitting: Physical Therapy

## 2016-09-16 DIAGNOSIS — R29818 Other symptoms and signs involving the nervous system: Secondary | ICD-10-CM

## 2016-09-16 DIAGNOSIS — R2689 Other abnormalities of gait and mobility: Secondary | ICD-10-CM

## 2016-09-16 DIAGNOSIS — R2681 Unsteadiness on feet: Secondary | ICD-10-CM | POA: Diagnosis not present

## 2016-09-16 DIAGNOSIS — R293 Abnormal posture: Secondary | ICD-10-CM

## 2016-09-16 NOTE — Therapy (Signed)
[facility] [address] [place], [place], [address] Phone: [phone]   Fax:  [phone]  Physical Therapy Treatment  Patient Details  Name: Pamela Patton MRN: [id number] Date of Birth: 1946-04-18 Referring Provider: [name], DO  Encounter Date: 09/16/2016      PT End of Session - 09/16/16 1133    Visit Number 8   Number of Visits 13   Date for PT Re-Evaluation 10/17/16   Authorization Type UHC Medicare-GCODE every 10th visit   PT Start Time 0850   PT Stop Time 0930   PT Time Calculation (min) 40 min   Equipment Utilized During Treatment Gait belt   Activity Tolerance Patient tolerated treatment well   Behavior During Therapy [facility] for tasks assessed/performed      Past Medical History:  Diagnosis Date  . Anemia   . Arthritis   . Cancer [facility])    fibroid tumor  . Cataract    immature   . Chest pain summer 2012  . Chicken pox   . Diverticulosis    difficulty with colonoscopy 2005  . 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 Elyse Hsu 10/08/2012  . Fracture    radial/ulnar 03/2016  . GERD (gastroesophageal reflux disease)   . Heart murmur    innocent murmur  . High cholesterol   . Hypertension   . Hypothyroid, s/p radioactive iodine tx for graves 2001 08/24/2012  . Intestinal metaplasia of gastric mucosa   . Kidney disease   . Renal cancer ([place])   . UTI (urinary tract infection)   . Weight loss    eval with GI in 2016; s/p CT/MRI and PET scans    Past Surgical History:  Procedure Laterality Date  . ABDOMINAL HYSTERECTOMY  1989  . COLONOSCOPY    . DG BARIUM SWALLOW ([place] [facility])     9 yrs ago per pt due to incomplete colonsocopy    There were no vitals filed for this visit.      Subjective Assessment - 09/16/16 0853    Subjective Pt reports feeling like she's a little more tired today than usual.   Pertinent History DM, fracture L ulna/radius  03/2016, GERD, HTN   Patient Stated Goals Pt's goal for therapy is to help with strength and coordination.   Currently in Pain? No/denies                         [facility] Adult PT Treatment/Exercise - 09/16/16 0001      Ambulation/Gait   Ambulation/Gait Yes   Ambulation/Gait Assistance 5: Supervision;6: Modified independent (Device/Increase time)   Ambulation/Gait Assistance Details Treadmill up to 1.6 mph, bilateral UE support x 5 minutes with cues for increased step length and heelstrike   Ambulation Distance (Feet) 400 Feet  x 2, then 200   Assistive device None  bilateral walking poles to facilitate arm swing   Gait Pattern Decreased arm swing - right;Decreased arm swing - left;Decreased trunk rotation;Trunk flexed;Step-through pattern;Decreased step length - right;Decreased step length - left;Poor foot clearance - left;Poor foot clearance - right  Improved arm swing and foot clearance/heelstrike with cues   Ambulation Surface Level;Indoor   Gait Comments Used bilateral walking poles to help facilitate reciprocal arm swing with gait.  Without poles, tactile cues given at shoulders to facilitate gentle trunk rotation and arm swing with verbal cues for increased step length.     Knee/Hip Exercises: Aerobic  Nustep NuStep Level 6, 4 extremities x 8 minutes, with cues for steps/min > 50 for increased intensity of movement              Balance Exercises - 09/16/16 1129      Balance Exercises: Standing   Stepping Strategy Anterior;Posterior;10 reps;UE support  without stopping in middle, cues for weightshift   Other Standing Exercises Forward step taps to 6", then 12" step x 12 reps each with UE support; then forward step tap<>posterior step and weightshift x 10 reps each with UE support.  Ended session with sit<>stand x 5 reps with PWR! UP posture in standing with cues for terminal knee extension in standing             PT Short Term Goals - 09/01/16 0920       PT SHORT TERM GOAL #1   Title Pt will be independent with HEP for improved balance, posture, gait for improved functional mobility.  TARGET 09/01/16   Status Achieved     PT SHORT TERM GOAL #2   Title Pt will perform at least 8 of 10 reps of sit<>stand transfers with minimal UE support with no posterior lean.   Status Achieved     PT SHORT TERM GOAL #3   Title Pt will improve single limb stance to at least 3 seconds bilateral lower extremities for improved step and obstacle negotiation.   Status Achieved           PT Long Term Goals - 08/21/16 1432      PT LONG TERM GOAL #1   Title Pt will verbalize understanding of fall prevention in home environment.  TARGET 10/01/16   Time 6   Period Weeks   Status New     PT LONG TERM GOAL #2   Title Pt will improve TUG and TUG manual scores to less than 10% difference, for improved dual tasking with gait.   Time 6   Period Weeks   Status New     PT LONG TERM GOAL #3   Title Pt will negotiate at least 4 steps with handrails, step through pattern, modified independently for safe stair negotiation at home.   Time 6   Period Weeks   Status New     PT LONG TERM GOAL #4   Title Pt will ambulate outdoor surfaces, at least 1000 ft with appropriate assistive device, modified independently for improved community gait.   Time 6   Period Weeks   Status New     PT LONG TERM GOAL #5   Title Pt will verbalize plans for continued community fitness upon D/C from PT.   Time 6   Period Weeks   Status New               Plan - 09/16/16 1133    Clinical Impression Statement Treatment focused today on intensity of gait for improved step length, arm swing and heelstrike with gait.  Pt improves gait pattern following treadmill training facilitation of arm swing using walking poles.  Pt will continue to benefit from further skilled PT to address balance, posture, and gait.   PT Treatment/Interventions ADLs/Self Care Home  Management;Functional mobility training;Gait training;DME Instruction;Therapeutic activities;Therapeutic exercise;Balance training;Neuromuscular re-education;Patient/family education;Stair training   PT Next Visit Plan continue PWR! moves PRN, ankle/hip/step strategies, gait activities for intensity of movement patterns (treadmill and walking pole facilitation), posture with standing and gait activities   Consulted and Agree with Plan of Care Patient  Patient will benefit from skilled therapeutic intervention in order to improve the following deficits and impairments:  Abnormal gait, Decreased balance, Decreased mobility, Decreased strength, Difficulty walking, Impaired tone, Postural dysfunction  Visit Diagnosis: Other abnormalities of gait and mobility  Abnormal posture  Other symptoms and signs involving the nervous system     Problem List Patient Active Problem List   Diagnosis Date Noted  . PD (Parkinson's disease) ([place]) 08/08/2016  . Loss of weight 07/25/2016  . 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 Elyse Hsu 10/08/2012  . CKD (chronic kidney disease) stage 3, GFR 30-59 ml/min 10/08/2012  . Hypertension 08/24/2012  . Hypothyroid, s/p radioactive iodine tx for graves 2001 - managed by endocrinologist, Dr. Elyse Hsu 08/24/2012  . Hyperlipemia 08/24/2012  . Vitamin D deficiency 08/24/2012    MARRIOTT,AMY W. 09/16/2016, 11:37 AM  [facility]t., PT [facility] [address] [place], [place], [address] Phone: [phone]   Fax:  [phone]  Name: Pamela Patton MRN: [id number] Date of Birth: [birth year]

## 2016-09-19 ENCOUNTER — Ambulatory Visit: Payer: Medicare Other | Admitting: Physical Therapy

## 2016-09-19 ENCOUNTER — Encounter: Payer: Self-pay | Admitting: Physical Therapy

## 2016-09-19 ENCOUNTER — Ambulatory Visit: Payer: Medicare Other | Admitting: Occupational Therapy

## 2016-09-19 DIAGNOSIS — R293 Abnormal posture: Secondary | ICD-10-CM

## 2016-09-19 DIAGNOSIS — R2689 Other abnormalities of gait and mobility: Secondary | ICD-10-CM

## 2016-09-19 DIAGNOSIS — R2681 Unsteadiness on feet: Secondary | ICD-10-CM | POA: Diagnosis not present

## 2016-09-19 DIAGNOSIS — R29898 Other symptoms and signs involving the musculoskeletal system: Secondary | ICD-10-CM

## 2016-09-19 DIAGNOSIS — R29818 Other symptoms and signs involving the nervous system: Secondary | ICD-10-CM

## 2016-09-19 DIAGNOSIS — R278 Other lack of coordination: Secondary | ICD-10-CM

## 2016-09-19 NOTE — Therapy (Signed)
[facility] [address] [place], [place], [address] Phone: [phone]   Fax:  [phone]  Physical Therapy Treatment  Patient Details  Name: Pamela Patton MRN: [id number] Date of Birth: January 28, 1946 Referring Provider: [name], DO  Encounter Date: 09/19/2016      PT End of Session - 09/19/16 0928    Visit Number 9   Number of Visits 13   Date for PT Re-Evaluation 10/17/16   Authorization Type UHC Medicare-GCODE every 10th visit   PT Start Time 0850   PT Stop Time 0928   PT Time Calculation (min) 38 min   Equipment Utilized During Treatment Gait belt   Activity Tolerance Patient tolerated treatment well   Behavior During Therapy [facility] for tasks assessed/performed      Past Medical History:  Diagnosis Date  . Anemia   . Arthritis   . Cancer [facility])    fibroid tumor  . Cataract    immature   . Chest pain summer 2012  . Chicken pox   . Diverticulosis    difficulty with colonoscopy 2005  . 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 Elyse Hsu 10/08/2012  . Fracture    radial/ulnar 03/2016  . GERD (gastroesophageal reflux disease)   . Heart murmur    innocent murmur  . High cholesterol   . Hypertension   . Hypothyroid, s/p radioactive iodine tx for graves 2001 08/24/2012  . Intestinal metaplasia of gastric mucosa   . Kidney disease   . Renal cancer ([place])   . UTI (urinary tract infection)   . Weight loss    eval with GI in 2016; s/p CT/MRI and PET scans    Past Surgical History:  Procedure Laterality Date  . ABDOMINAL HYSTERECTOMY  1989  . COLONOSCOPY    . DG BARIUM SWALLOW ([place] [facility])     9 yrs ago per pt due to incomplete colonsocopy    There were no vitals filed for this visit.      Subjective Assessment - 09/19/16 0851    Subjective Pt was busy this weekend; energy level was good.   Pertinent History DM, fracture L ulna/radius 03/2016, GERD, HTN   Patient Stated Goals Pt's goal for therapy is to help with strength and coordination.   Currently in Pain? No/denies                         [facility] Adult PT Treatment/Exercise - 09/19/16 0001      Ambulation/Gait   Ambulation/Gait Yes   Ambulation/Gait Assistance 5: Supervision;6: Modified independent (Device/Increase time)   Ambulation/Gait Assistance Details Treadmill up to 2.0 mph, bilateral UE support x 5 minutes with cues for increased step length and heelstrike   Ambulation Distance (Feet) 400 Feet   Assistive device None  bilateral walking poles to facilitate increased reciprocal arm swing.   Gait Pattern Decreased arm swing - right;Decreased arm swing - left;Decreased trunk rotation;Trunk flexed;Step-through pattern;Decreased step length - right;Decreased step length - left;Poor foot clearance - left;Poor foot clearance - right   Ambulation Surface Level;Indoor             Balance Exercises - 09/19/16 0906      Balance Exercises: Standing   Standing Eyes Opened Wide (BOA);Head turns;Foam/compliant surface  + lateral weight shifting + PWR! UP  in standing; multiple reps with each, cues for balance strategies.   Stepping Strategy Anterior;Posterior  step and weight shift progressing  with PWR! type UE movement             PT Short Term Goals - 09/01/16 0920      PT SHORT TERM GOAL #1   Title Pt will be independent with HEP for improved balance, posture, gait for improved functional mobility.  TARGET 09/01/16   Status Achieved     PT SHORT TERM GOAL #2   Title Pt will perform at least 8 of 10 reps of sit<>stand transfers with minimal UE support with no posterior lean.   Status Achieved     PT SHORT TERM GOAL #3   Title Pt will improve single limb stance to at least 3 seconds bilateral lower extremities for improved step and obstacle negotiation.   Status Achieved           PT Long Term Goals - 08/21/16 1432      PT LONG TERM GOAL #1    Title Pt will verbalize understanding of fall prevention in home environment.  TARGET 10/01/16   Time 6   Period Weeks   Status New     PT LONG TERM GOAL #2   Title Pt will improve TUG and TUG manual scores to less than 10% difference, for improved dual tasking with gait.   Time 6   Period Weeks   Status New     PT LONG TERM GOAL #3   Title Pt will negotiate at least 4 steps with handrails, step through pattern, modified independently for safe stair negotiation at home.   Time 6   Period Weeks   Status New     PT LONG TERM GOAL #4   Title Pt will ambulate outdoor surfaces, at least 1000 ft with appropriate assistive device, modified independently for improved community gait.   Time 6   Period Weeks   Status New     PT LONG TERM GOAL #5   Title Pt will verbalize plans for continued community fitness upon D/C from PT.   Time 6   Period Weeks   Status New               Plan - 09/19/16 0929    Clinical Impression Statement Progressed balance treatment with compliant surface training working on weight shifting and static standing with head turns; pt requiring intermittent UE support. Pt continues to require intermittent UE support with stepping strategies with UE movement esp. stepping backwards. Pt demonstrated good activity tolerance during session not needing seated rest.   PT Treatment/Interventions ADLs/Self Care Home Management;Functional mobility training;Gait training;DME Instruction;Therapeutic activities;Therapeutic exercise;Balance training;Neuromuscular re-education;Patient/family education;Stair training   PT Next Visit Plan G-code; continue PWR! moves PRN, ankle/hip/step strategies, gait activities for intensity of movement patterns (treadmill and walking pole facilitation), posture with standing and gait activities   Consulted and Agree with Plan of Care Patient      Patient will benefit from skilled therapeutic intervention in order to improve the following  deficits and impairments:  Abnormal gait, Decreased balance, Decreased mobility, Decreased strength, Difficulty walking, Impaired tone, Postural dysfunction  Visit Diagnosis: Other abnormalities of gait and mobility  Abnormal posture  Unsteadiness on feet     Problem List Patient Active Problem List   Diagnosis Date Noted  . PD (Parkinson's disease) ([address]) 08/08/2016  . Loss of weight 07/25/2016  . 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 Elyse Hsu 10/08/2012  . CKD (chronic kidney disease) stage 3, GFR 30-59 ml/min 10/08/2012  . Hypertension 08/24/2012  .  Hypothyroid, s/p radioactive iodine tx for graves 2001 - managed by endocrinologist, Dr. Elyse Hsu 08/24/2012  . Hyperlipemia 08/24/2012  . Vitamin D deficiency 08/24/2012    Bjorn Loser, PTA  09/19/16, 11:44 AM [facility] [address] [place], [place], [address] Phone: [phone]   Fax:  [phone]  Name: Pamela Patton MRN: [id number] Date of Birth: 22-Jun-1946

## 2016-09-19 NOTE — Therapy (Signed)
[facility] [address] [place], [place], [address] Phone: [phone]   Fax:  [phone]  Occupational Therapy Treatment  Patient Details  Name: Pamela Patton MRN: [id number] Date of Birth: 1946-07-31 Referring Provider: Dr Carles Collet  Encounter Date: 09/19/2016      OT End of Session - 09/19/16 0959    Visit Number 9   Number of Visits 17   Date for OT Re-Evaluation 10/15/16   Authorization Type UHC MCR   OT Start Time 0933   OT Stop Time 1015   OT Time Calculation (min) 42 min   Activity Tolerance Patient tolerated treatment well   Behavior During Therapy [facility] for tasks assessed/performed      Past Medical History:  Diagnosis Date  . Anemia   . Arthritis   . Cancer [facility])    fibroid tumor  . Cataract    immature   . Chest pain summer 2012  . Chicken pox   . Diverticulosis    difficulty with colonoscopy 2005  . 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 Elyse Hsu 10/08/2012  . Fracture    radial/ulnar 03/2016  . GERD (gastroesophageal reflux disease)   . Heart murmur    innocent murmur  . High cholesterol   . Hypertension   . Hypothyroid, s/p radioactive iodine tx for graves 2001 08/24/2012  . Intestinal metaplasia of gastric mucosa   . Kidney disease   . Renal cancer ([place])   . UTI (urinary tract infection)   . Weight loss    eval with GI in 2016; s/p CT/MRI and PET scans    Past Surgical History:  Procedure Laterality Date  . ABDOMINAL HYSTERECTOMY  1989  . COLONOSCOPY    . DG BARIUM SWALLOW ([place] HX)     9 yrs ago per pt due to incomplete colonsocopy    There were no vitals filed for this visit.      Subjective Assessment - 09/19/16 0931    Subjective  Denies pain   Pertinent History see Epic   Patient Stated Goals to maintain indepenence   Currently in Pain? No/denies           Reviewed donning doffing jacket with adapted strategy x  4, pt returned demonstration, with improved speed. Therapist checked progress towards short term goals and discussed with pt. Arm bike x 5 mins level 1 for conditioning, pt maintained >25 rpm              [facility]) - 09/19/16 1001    PWR! exercises Moves in supine   PWR! Up x20   PWR! Rock [employer]! Twist x20   PWR! Step x20   Comments min v.c for large amplitude movements  and intensity               OT Short Term Goals - 09/19/16 0935      OT SHORT TERM GOAL #1   Title I with PD specific HEP.   Time 4   Period Weeks   Status Achieved     OT SHORT TERM GOAL #2   Title Pt will verbalize understanding of adapted strategies for ADLs/ IADLs.   Time 4   Period Weeks   Status Achieved     OT SHORT TERM GOAL #3   Title Pt will improve LUE fine motor coordiantion as evidenced by performing 9 hole peg test in 65 secs or less.   Baseline LUE 80.81  secs, RUE 48.32 secs   Time 4   Period Weeks   Status Achieved  46.31 secs     OT SHORT TERM GOAL #4   Title Pt will demonstrate improved ease with dressing as evidenced by performing PPT#4 (donning/ doffing jacket ) in 60 secs or less.   Time 4   Period Weeks   Status Achieved  43.47 secs     OT SHORT TERM GOAL #5   Title Pt will demonstrate improved RUE fine motor coordination as evidenced by decreasing 9 hole peg test score to 43 secs or less.   Time 4   Period Weeks   Status On-going  57.47 secs, 37.13 secs, not consistent     OT SHORT TERM GOAL #6   Title assess handwriting and set goal PRN.   Time 4   Period Weeks   Status Deferred  good size and legibility, pt provided with hand writing suggestions in case she nees tham in the future           OT Long Term Goals - 08/17/16 1512      OT LONG TERM GOAL #1   Title Pt will verbalize understanding of ways to prevent future PD related complications and verbalize understanding of community resources.   Time 8   Period Weeks   Status New     OT  LONG TERM GOAL #2   Title Pt will demonstrate ability to retrieve a lightweight object at 120 shoulder flexion with -20 elbow ext for LUE.   Baseline 115, -28   Time 8   Period Weeks   Status New     OT LONG TERM GOAL #3   Title Pt will demosntrate ability to retrieve an item at 130 shoulder flexion and -20 elbow ext for RUE.   Baseline 130, -30   Time 8   Period Weeks   Status New     OT LONG TERM GOAL #4   Title Pt will decrease 3 button / unbutton test to 90 secs or less.   Baseline 2 mins, 4 secs   Time 8   Period Weeks   Status New     OT LONG TERM GOAL #5   Title Pt will decrease PPT#2 (simulated feeding) to 16 secs or less.   Time 8   Period Weeks   Status New     OT LONG TERM GOAL #6   Title Pt will verbalize understanding of ways to keep thinking skills sharp   Time 8   Period Weeks   Status New               Plan - 09/19/16 1004    Clinical Impression Statement Pt demonstrates good overall progress towards goals. See goals for progress.   Rehab Potential Good   OT Frequency 2x / week   OT Duration 8 weeks   OT Treatment/Interventions Self-care/ADL training;Therapeutic exercise;Cognitive remediation/compensation;Visual/perceptual remediation/compensation;Splinting;Neuromuscular education;Moist Heat;Parrafin;Fluidtherapy;Energy conservation;Therapeutic exercises;Patient/family education;Balance training;Therapeutic activities;Passive range of motion;Manual Therapy;DME and/or AE instruction;Contrast Bath;Ultrasound;Cryotherapy      Patient will benefit from skilled therapeutic intervention in order to improve the following deficits and impairments:  Abnormal gait, Decreased cognition, Impaired vision/preception, Impaired flexibility, Decreased knowledge of use of DME, Decreased coordination, Decreased mobility, Impaired tone, Decreased strength, Decreased range of motion, Decreased endurance, Decreased activity tolerance, Decreased balance, Decreased  knowledge of precautions, Difficulty walking, Impaired perceived functional ability, Impaired UE functional use  Visit Diagnosis: Other abnormalities of gait and mobility  Abnormal posture  Unsteadiness  on feet  Other symptoms and signs involving the nervous system  Other symptoms and signs involving the musculoskeletal system  Other lack of coordination    Problem List Patient Active Problem List   Diagnosis Date Noted  . PD (Parkinson's disease) ([place]) 08/08/2016  . Loss of weight 07/25/2016  . 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 Elyse Hsu 10/08/2012  . CKD (chronic kidney disease) stage 3, GFR 30-59 ml/min 10/08/2012  . Hypertension 08/24/2012  . Hypothyroid, s/p radioactive iodine tx for graves 2001 - managed by endocrinologist, Dr. Elyse Hsu 08/24/2012  . Hyperlipemia 08/24/2012  . Vitamin D deficiency 08/24/2012    Issaih Kaus 09/19/2016, 10:22 AM  [facility] [address] [place], [place], [address] Phone: [phone]   Fax:  [phone]  Name: Pamela Patton MRN: [id number] Date of Birth: Nov 19, 1945

## 2016-09-20 ENCOUNTER — Ambulatory Visit: Payer: Medicare Other | Admitting: Occupational Therapy

## 2016-09-23 ENCOUNTER — Ambulatory Visit: Payer: Medicare Other | Admitting: Physical Therapy

## 2016-09-23 ENCOUNTER — Ambulatory Visit: Payer: Medicare Other

## 2016-09-23 DIAGNOSIS — R2689 Other abnormalities of gait and mobility: Secondary | ICD-10-CM

## 2016-09-23 DIAGNOSIS — R29818 Other symptoms and signs involving the nervous system: Secondary | ICD-10-CM

## 2016-09-23 DIAGNOSIS — R1310 Dysphagia, unspecified: Secondary | ICD-10-CM

## 2016-09-23 DIAGNOSIS — R471 Dysarthria and anarthria: Secondary | ICD-10-CM

## 2016-09-23 DIAGNOSIS — R2681 Unsteadiness on feet: Secondary | ICD-10-CM

## 2016-09-23 DIAGNOSIS — R293 Abnormal posture: Secondary | ICD-10-CM

## 2016-09-23 NOTE — Therapy (Signed)
[facility] [address] [place], [place], [address] Phone: [phone]   Fax:  [phone]  Speech Language Pathology Evaluation  Patient Details  Name: Pamela Patton MRN: [id number] Date of Birth: 1946/03/07 Referring Provider: [name], DO  Encounter Date: 09/23/2016      End of Session - 09/23/16 1337    Visit Number 1   Number of Visits 17   Date for SLP Re-Evaluation 12/09/16   SLP Start Time 0803   SLP Stop Time  0846   SLP Time Calculation (min) 43 min   Activity Tolerance Patient tolerated treatment well      Past Medical History:  Diagnosis Date  . Anemia   . Arthritis   . Cancer [facility])    fibroid tumor  . Cataract    immature   . Chest pain summer 2012  . Chicken pox   . Diverticulosis    difficulty with colonoscopy 2005  . 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 Elyse Hsu 10/08/2012  . Fracture    radial/ulnar 03/2016  . GERD (gastroesophageal reflux disease)   . Heart murmur    innocent murmur  . High cholesterol   . Hypertension   . Hypothyroid, s/p radioactive iodine tx for graves 2001 08/24/2012  . Intestinal metaplasia of gastric mucosa   . Kidney disease   . Renal cancer ([employer])   . UTI (urinary tract infection)   . Weight loss    eval with GI in 2016; s/p CT/MRI and PET scans    Past Surgical History:  Procedure Laterality Date  . ABDOMINAL HYSTERECTOMY  1989  . COLONOSCOPY    . DG BARIUM SWALLOW ([place] HX)     9 yrs ago per pt due to incomplete colonsocopy    There were no vitals filed for this visit.      Subjective Assessment - 09/23/16 0816    Subjective "People don't usually have trouble understanding me."   Currently in Pain? No/denies            SLP Evaluation [facility] - 09/23/16 0816      SLP Visit Information   SLP Received On 09/23/16   Referring Provider [name], Wells Guiles, DO   Onset Date diagnosed October  2017   Medical Diagnosis Parkinsonsism     General Information   HPI Pt was diagnosed with PD/Parkinsonism in October 2017. DiffDx not authoritatively PSP due to tremor.      Auditory Comprehension   Overall Auditory Comprehension Appears within functional limits for tasks assessed     Verbal Expression   Overall Verbal Expression Appears within functional limits for tasks assessed     Oral Motor/Sensory Function   Overall Oral Motor/Sensory Function Impaired   Labial ROM Within Functional Limits   Labial Symmetry Within Functional Limits   Labial Strength Reduced   Labial Coordination Reduced  slow movement   Lingual ROM Within Functional Limits   Lingual Symmetry Within Functional Limits   Lingual Strength Reduced  lt more than rt   Lingual Coordination Reduced   Velum Within Functional Limits     Motor Speech   Overall Motor Speech Impaired   Respiration Impaired   Level of Impairment Phrase   Phonation Low vocal intensity  harsh; intermittently low volume   Articulation Impaired   Level of Impairment Phrase   Intelligibility Intelligible   Phonation Impaired   Volume Decibel Level  average 69dB over 20 minutes  In assessment of pt's swallowing today pt took sips water with an audible swallow and minimal immediate cough, indicators of oropharyngeal dysphagia. SLP inquired further at this point, and pt stated this coughing happens at least once/day with liquids, and if she "isn't careful with (her) pills" she can cough rather easily during med administration.  SLP had pt engage in diagnostic therapy, and had pt swallow using a 1/2 second hold and effortful swallow. With 5 swallows, no overt s/s difficulty was observed. SLP told pt to cont to swallow liquids using this technique. A modified barium swallow exam is recommended to objectively assess pt's safety with her oropharyngeal swallow.                    SLP Education - 09/23/16 1336    Education  provided Yes   Education Details compensation for liquid PO (short 1/2 second oral hold then hard swallow)   Person(s) Educated Patient   Methods Explanation;Demonstration;Verbal cues   Comprehension Verbalized understanding;Returned demonstration          SLP Short Term Goals - 09/23/16 1340      SLP SHORT TERM GOAL #1   Title pt will demo appropriate breath support in 19/20 sentence responses over three sessions   Time 4   Period Weeks   Status New     SLP SHORT TERM GOAL #2   Title pt will complete HEP for dysarthria with rare min A   Time 4   Period Weeks   Status New     SLP SHORT TERM GOAL #3   Title pt will respond to structured speech tasks with sentences in a noisy environment with 100% intelligibility over three sessions   Time 4   Period Weeks   Status New          SLP Long Term Goals - 09/23/16 1341      SLP LONG TERM GOAL #1   Title pt will demo swallow precautions from modified barium swallow (MBS) with POs PRN during session with 95% success   Time 8   Period Weeks   Status New     SLP LONG TERM GOAL #2   Title pt will produce conversational speech appropriate for 100% intelligibilty in 20 minutes conversation in noisy environment   Time 8   Period Weeks   Status New     SLP LONG TERM GOAL #3   Title pt will demo appropriate abdominal breath support in 20 minutes conversation   Time 8   Period Weeks   Status New          Plan - 09/23/16 1338    Clinical Impression Statement Pt presents with dysarthria c/b reduced breath support, reduced articulatory ROM in conversation, and reduced speech intonation. Pt also presents with dysphagia with liquids - MBS recommended to Dr. Carles Collet. Pt would benefit from skilled ST addressing deficits with speech, and swallowing(if necessary).Marland Kitchen   Speech Therapy Frequency 2x / week   Duration --  8 weeks, or 16 visits   Treatment/Interventions Aspiration precaution training;Pharyngeal strengthening exercises;Diet  toleration management by SLP;Cueing hierarchy;Internal/external aids;SLP instruction and feedback;Functional tasks;Patient/family education;Compensatory strategies;Oral motor exercises  any or all may be used   Potential to Achieve Goals Good   Consulted and Agree with Plan of Care Patient      Patient will benefit from skilled therapeutic intervention in order to improve the following deficits and impairments:   Dysarthria and anarthria  Dysphagia, unspecified type  G-Codes - 09/23/16 1347    Functional Assessment Tool Used noms - 6 (20-25% impaired)   Functional Limitations Motor speech   Motor Speech Current Status [phone]) At least 20 percent but less than 40 percent impaired, limited or restricted   Motor Speech Goal Status [id number]) At least 1 percent but less than 20 percent impaired, limited or restricted      Problem List Patient Active Problem List   Diagnosis Date Noted  . PD (Parkinson's disease) ([place]) 08/08/2016  . Loss of weight 07/25/2016  . 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 Elyse Hsu 10/08/2012  . CKD (chronic kidney disease) stage 3, GFR 30-59 ml/min 10/08/2012  . Hypertension 08/24/2012  . Hypothyroid, s/p radioactive iodine tx for graves 2001 - managed by endocrinologist, Dr. Elyse Hsu 08/24/2012  . Hyperlipemia 08/24/2012  . Vitamin D deficiency 08/24/2012    [facility] ,MS, CCC-SLP  09/23/2016, 1:48 PM  [facility] [address] [place], [place], [address] Phone: [phone]   Fax:  [phone]  Name: AVERILL GORRA MRN: [id number] Date of Birth: [birth year]

## 2016-09-23 NOTE — Therapy (Signed)
[facility] [address] [place], [place], [address] Phone: [phone]   Fax:  [phone]  Physical Therapy Treatment  Patient Details  Name: Pamela Patton MRN: [id number] Date of Birth: [birth year] Referring Provider: [name], DO  Encounter Date: 09/23/2016      PT End of Session - 09/23/16 0925    Visit Number 10   Number of Visits 13   Date for PT Re-Evaluation 10/17/16   Authorization Type UHC Medicare-GCODE every 10th visit   PT Start Time 0849   PT Stop Time 0929   PT Time Calculation (min) 40 min   Equipment Utilized During Treatment Gait belt   Activity Tolerance Patient tolerated treatment well   Behavior During Therapy [facility] for tasks assessed/performed      Past Medical History:  Diagnosis Date  . Anemia   . Arthritis   . Cancer [facility])    fibroid tumor  . Cataract    immature   . Chest pain summer 2012  . Chicken pox   . Diverticulosis    difficulty with colonoscopy 2005  . 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 Elyse Hsu 10/08/2012  . Fracture    radial/ulnar 03/2016  . GERD (gastroesophageal reflux disease)   . Heart murmur    innocent murmur  . High cholesterol   . Hypertension   . Hypothyroid, s/p radioactive iodine tx for graves 2001 08/24/2012  . Intestinal metaplasia of gastric mucosa   . Kidney disease   . Renal cancer ([place])   . UTI (urinary tract infection)   . Weight loss    eval with GI in 2016; s/p CT/MRI and PET scans    Past Surgical History:  Procedure Laterality Date  . ABDOMINAL HYSTERECTOMY  1989  . COLONOSCOPY    . DG BARIUM SWALLOW ([place] HX)     9 yrs ago per pt due to incomplete colonsocopy    There were no vitals filed for this visit.      Subjective Assessment - 09/23/16 0847    Subjective feeling pretty good today   Pertinent History DM, fracture L ulna/radius 03/2016, GERD, HTN   Patient Stated Goals  Pt's goal for therapy is to help with strength and coordination.   Currently in Pain? No/denies            [facility] PT Assessment - 09/23/16 0851      Single Leg Stance   Comments RLE > 20 sec; LLE 5.5 sec     Ambulation/Gait   Ambulation/Gait Yes   Ambulation/Gait Assistance 5: Supervision;6: Modified independent (Device/Increase time)   Ambulation/Gait Assistance Details treadmill 1.6-2.0 mph x 8 min with cues for step length and heel strike   Ambulation Distance (Feet) 800 Feet   Assistive device None   Gait Pattern Decreased arm swing - right;Decreased arm swing - left;Decreased trunk rotation;Trunk flexed;Step-through pattern;Decreased step length - right;Decreased step length - left;Poor foot clearance - left;Poor foot clearance - right   Ambulation Surface Level;Indoor   Gait Comments treadmill followed by overground amb with walking poles then no assistance for big arm swing; improved arm swing with walking poles     Timed Up and Go Test   Normal TUG (seconds) 11.78   Manual TUG (seconds) 10.72                     [facility] Adult PT Treatment/Exercise - 09/23/16 0851      Knee/Hip Exercises:  Aerobic   Nustep SciFit L3.0 x 8 min; 4 extremities with goal to keep RPM > 40             Balance Exercises - 09/23/16 0922      Balance Exercises: Standing   Other Standing Exercises on compliant surface: forward tap to 12" step with step back to floor then back to compliant surface x 10 alternating bil; performed lateral alt taps with weight shift from compliant surface to 12" step then other foot down to floor x 10 bil             PT Short Term Goals - 09/01/16 0920      PT SHORT TERM GOAL #1   Title Pt will be independent with HEP for improved balance, posture, gait for improved functional mobility.  TARGET 09/01/16   Status Achieved     PT SHORT TERM GOAL #2   Title Pt will perform at least 8 of 10 reps of sit<>stand transfers with minimal UE support  with no posterior lean.   Status Achieved     PT SHORT TERM GOAL #3   Title Pt will improve single limb stance to at least 3 seconds bilateral lower extremities for improved step and obstacle negotiation.   Status Achieved           PT Long Term Goals - 08/21/16 1432      PT LONG TERM GOAL #1   Title Pt will verbalize understanding of fall prevention in home environment.  TARGET 10/01/16   Time 6   Period Weeks   Status New     PT LONG TERM GOAL #2   Title Pt will improve TUG and TUG manual scores to less than 10% difference, for improved dual tasking with gait.   Time 6   Period Weeks   Status New     PT LONG TERM GOAL #3   Title Pt will negotiate at least 4 steps with handrails, step through pattern, modified independently for safe stair negotiation at home.   Time 6   Period Weeks   Status New     PT LONG TERM GOAL #4   Title Pt will ambulate outdoor surfaces, at least 1000 ft with appropriate assistive device, modified independently for improved community gait.   Time 6   Period Weeks   Status New     PT LONG TERM GOAL #5   Title Pt will verbalize plans for continued community fitness upon D/C from PT.   Time 6   Period Weeks   Status New               Plan - 09/23/16 [id number]    Clinical Impression Statement Pt with improved timed up and go from initial evals and with improved step length and arm swing.  Still needs occasional cues for arm swing.  Will cotninue to benefit from PT to maximize function.   PT Treatment/Interventions ADLs/Self Care Home Management;Functional mobility training;Gait training;DME Instruction;Therapeutic activities;Therapeutic exercise;Balance training;Neuromuscular re-education;Patient/family education;Stair training   PT Next Visit Plan continue PWR! moves PRN, ankle/hip/step strategies, gait activities for intensity of movement patterns (treadmill and walking pole facilitation), posture with standing and gait activities    Consulted and Agree with Plan of Care Patient      Patient will benefit from skilled therapeutic intervention in order to improve the following deficits and impairments:  Abnormal gait, Decreased balance, Decreased mobility, Decreased strength, Difficulty walking, Impaired tone, Postural dysfunction  Visit Diagnosis: Other abnormalities  of gait and mobility  Abnormal posture  Unsteadiness on feet  Other symptoms and signs involving the nervous system       G-Codes - 2016-10-05 0926    Functional Assessment Tool Used SLS RLE >20 sec, TUG 11.74 sec, TUG manual 10 sec; no falls since PT   Functional Limitation Mobility: Walking and moving around   Mobility: Walking and Moving Around Current Status [phone]) At least 1 percent but less than 20 percent impaired, limited or restricted   Mobility: Walking and Moving Around Goal Status [phone]) At least 1 percent but less than 20 percent impaired, limited or restricted      Problem List Patient Active Problem List   Diagnosis Date Noted  . PD (Parkinson's disease) ([place]) 08/08/2016  . Loss of weight 07/25/2016  . 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 Elyse Hsu 10/08/2012  . CKD (chronic kidney disease) stage 3, GFR 30-59 ml/min 10/08/2012  . Hypertension 08/24/2012  . Hypothyroid, s/p radioactive iodine tx for graves 2001 - managed by endocrinologist, Dr. Elyse Hsu 08/24/2012  . Hyperlipemia 08/24/2012  . Vitamin D deficiency 08/24/2012       Laureen Abrahams, PT, DPT 10/05/16 9:29 AM    [facility] [address] [place], [place], [address] Phone: [phone]   Fax:  [phone]  Name: Pamela Patton MRN: [id number] Date of Birth: Apr 01, 1946

## 2016-09-23 NOTE — Patient Instructions (Signed)
When you swallow, swallow HARD!

## 2016-09-27 ENCOUNTER — Telehealth: Payer: Self-pay | Admitting: Neurology

## 2016-09-27 DIAGNOSIS — R1319 Other dysphagia: Secondary | ICD-10-CM

## 2016-09-27 NOTE — Telephone Encounter (Signed)
Spoke with patient and she is okay with undergoing modified barium swallow.  Made aware of information below.   We have scheduled you at [facility] for your modified barium swallow on 10/13/16 at 11:00 am. Please arrive 15 minutes prior and go to 1st floor radiology. If you need to reschedule for any reason please call [phone].

## 2016-09-27 NOTE — Telephone Encounter (Signed)
-----   Message from Sharen Counter, [place] sent at 09/23/2016  1:34 PM EST ----- Amy Gothard/Dr. Carles Collet- I am recommending a MBS for this patient as she is having consistent difficulty with liquids at least once/day.  Please order if you agree.  Thank you! Glendell Docker

## 2016-09-28 ENCOUNTER — Other Ambulatory Visit (HOSPITAL_COMMUNITY): Payer: Self-pay | Admitting: Neurology

## 2016-09-28 DIAGNOSIS — R1319 Other dysphagia: Secondary | ICD-10-CM

## 2016-10-05 ENCOUNTER — Ambulatory Visit: Payer: Medicare Other | Admitting: Speech Pathology

## 2016-10-05 ENCOUNTER — Ambulatory Visit: Payer: Medicare Other | Admitting: Occupational Therapy

## 2016-10-05 ENCOUNTER — Ambulatory Visit: Payer: Medicare Other | Attending: Neurology | Admitting: Physical Therapy

## 2016-10-05 DIAGNOSIS — R29898 Other symptoms and signs involving the musculoskeletal system: Secondary | ICD-10-CM | POA: Diagnosis present

## 2016-10-05 DIAGNOSIS — R278 Other lack of coordination: Secondary | ICD-10-CM | POA: Diagnosis present

## 2016-10-05 DIAGNOSIS — R2689 Other abnormalities of gait and mobility: Secondary | ICD-10-CM

## 2016-10-05 DIAGNOSIS — R471 Dysarthria and anarthria: Secondary | ICD-10-CM | POA: Diagnosis present

## 2016-10-05 DIAGNOSIS — R1319 Other dysphagia: Secondary | ICD-10-CM | POA: Insufficient documentation

## 2016-10-05 DIAGNOSIS — R1310 Dysphagia, unspecified: Secondary | ICD-10-CM | POA: Diagnosis present

## 2016-10-05 DIAGNOSIS — R2681 Unsteadiness on feet: Secondary | ICD-10-CM | POA: Insufficient documentation

## 2016-10-05 DIAGNOSIS — R29818 Other symptoms and signs involving the nervous system: Secondary | ICD-10-CM | POA: Insufficient documentation

## 2016-10-05 DIAGNOSIS — R293 Abnormal posture: Secondary | ICD-10-CM | POA: Insufficient documentation

## 2016-10-05 NOTE — Therapy (Addendum)
Pamela Patton [address] [place], [place], [address] Phone: [phone]   Fax:  [phone]  Occupational Therapy Treatment  Patient Details  Name: Pamela Patton MRN: [id number] Date of Birth: 05-31-1946 Referring Provider: Dr Carles Collet  Encounter Date: 10/05/2016      OT End of Session - 10/05/16 0907    Visit Number 10   Number of Visits 17   Date for OT Re-Evaluation 10/15/16   Authorization Type UHC MCR   Authorization - Visit Number 1   Authorization - Number of Visits 10   OT Start Time [id number]   OT Stop Time 0930   OT Time Calculation (min) 41 min   Activity Tolerance Patient tolerated treatment well   Behavior During Therapy [facility] for tasks assessed/performed      Past Medical History:  Diagnosis Date  . Anemia   . Arthritis   . Cancer [facility])    fibroid tumor  . Cataract    immature   . Chest pain summer 2012  . Chicken pox   . Diverticulosis    difficulty with colonoscopy 2005  . 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 Elyse Hsu 10/08/2012  . Fracture    radial/ulnar 03/2016  . GERD (gastroesophageal reflux disease)   . Heart murmur    innocent murmur  . High cholesterol   . Hypertension   . Hypothyroid, s/p radioactive iodine tx for graves 2001 08/24/2012  . Intestinal metaplasia of gastric mucosa   . Kidney disease   . Renal cancer ([place])   . UTI (urinary tract infection)   . Weight loss    eval with GI in 2016; s/p CT/MRI and PET scans    Past Surgical History:  Procedure Laterality Date  . ABDOMINAL HYSTERECTOMY  1989  . COLONOSCOPY    . DG BARIUM SWALLOW ([place] HX)     9 yrs ago per pt due to incomplete colonsocopy    There were no vitals filed for this visit.      Subjective Assessment - 10/05/16 0854    Subjective  Denies pain   Pertinent History see Epic   Patient Stated Goals to maintain indepenence   Currently in Pain? No/denies                Reviewed donning/ doffing jacket with adapted strategy. Arm bike x 5 mins level 1 for conditioning, pt demonstrates ability to maintain 25-30 rpm. Checked 9 hole peg test for G code.          PWR [facility]) - 10/05/16 [id number]    PWR! Up x 10   PWR! Rock x 10 reps each side   PWR! Twist x 10 reps each side   PWR! Step x 10 reps each side   Comments Verbal and visual cues for technique and for intensity               OT Short Term Goals - 10/05/16 0856      OT SHORT TERM GOAL #1   Title I with PD specific HEP.   Time 4   Period Weeks   Status Achieved     OT SHORT TERM GOAL #2   Title Pt will verbalize understanding of adapted strategies for ADLs/ IADLs.   Time 4   Period Weeks   Status Achieved     OT SHORT TERM GOAL #3   Title Pt will improve LUE fine motor coordiantion as evidenced by performing 9  hole peg test in 65 secs or less.   Baseline LUE 80.81 secs, RUE 48.32 secs   Time 4   Period Weeks   Status Achieved  46.31 secs     OT SHORT TERM GOAL #4   Title Pt will demonstrate improved ease with dressing as evidenced by performing PPT#4 (donning/ doffing jacket ) in 60 secs or less.   Time 4   Period Weeks   Status Achieved  43.47 secs     OT SHORT TERM GOAL #5   Title Pt will demonstrate improved RUE fine motor coordination as evidenced by decreasing 9 hole peg test score to 43 secs or less.   Time 4   Period Weeks   Status Achieved  35.44 secs     OT SHORT TERM GOAL #6   Title assess handwriting and set goal PRN.   Time 4   Period Weeks   Status Deferred  good size and legibility, pt provided with hand writing suggestions in case she nees tham in the future           OT Long Term Goals - 08/17/16 1512      OT LONG TERM GOAL #1   Title Pt will verbalize understanding of ways to prevent future PD related complications and verbalize understanding of community resources.   Time 8   Period Weeks   Status New     OT LONG  TERM GOAL #2   Title Pt will demonstrate ability to retrieve a lightweight object at 120 shoulder flexion with -20 elbow ext for LUE.   Baseline 115, -28   Time 8   Period Weeks   Status New     OT LONG TERM GOAL #3   Title Pt will demosntrate ability to retrieve an item at 130 shoulder flexion and -20 elbow ext for RUE.   Baseline 130, -30   Time 8   Period Weeks   Status New     OT LONG TERM GOAL #4   Title Pt will decrease 3 button / unbutton test to 90 secs or less.   Baseline 2 mins, 4 secs   Time 8   Period Weeks   Status New     OT LONG TERM GOAL #5   Title Pt will decrease PPT#2 (simulated feeding) to 16 secs or less.   Time 8   Period Weeks   Status New     OT LONG TERM GOAL #6   Title Pt will verbalize understanding of ways to keep thinking skills sharp   Time 8   Period Weeks   Status New               Plan - 10/05/16 1056    Clinical Impression Statement Pt is progressing towards goals. She made excellent progress in fine motor coordination and ADL performance for donning/ doffing jacket. Pt can benefit from continued skilled occupational therapy to reinforce adapted strategies for ADLS and to transition pt into community exercise.   Rehab Potential Good   OT Frequency 2x / week   OT Duration 8 weeks   OT Treatment/Interventions Self-care/ADL training;Therapeutic exercise;Cognitive remediation/compensation;Visual/perceptual remediation/compensation;Splinting;Neuromuscular education;Moist Heat;Parrafin;Fluidtherapy;Energy conservation;Therapeutic exercises;Patient/family education;Balance training;Therapeutic activities;Passive range of motion;Manual Therapy;DME and/or AE instruction;Contrast Bath;Ultrasound;Cryotherapy   Plan review PWR! supine, work towards long term goals, anticipate d/c next week.   OT Home Exercise Plan issued PWR! seated, basic coordiantion HEP., modified quadraped, PWR! hands, supine   Consulted and Agree with Plan of Care Patient  Patient will benefit from skilled therapeutic intervention in order to improve the following deficits and impairments:  Abnormal gait, Decreased cognition, Impaired vision/preception, Impaired flexibility, Decreased knowledge of use of DME, Decreased coordination, Decreased mobility, Impaired tone, Decreased strength, Decreased range of motion, Decreased endurance, Decreased activity tolerance, Decreased balance, Decreased knowledge of precautions, Difficulty walking, Impaired perceived functional ability, Impaired UE functional use  Visit Diagnosis: Other abnormalities of gait and mobility  Abnormal posture  Other symptoms and signs involving the nervous system  Other symptoms and signs involving the musculoskeletal system  Other lack of coordination      G-Codes - 10/16/16 0925    Functional Assessment Tool Used 9 hole peg test RUE 35.44 secs, LUE 45.81 secs, PPT# 4:23.41 secs   Functional Limitation Self care   Self Care Current Status [id number]) At least 20 percent but less than 40 percent impaired, limited or restricted   Self Care Goal Status [id number]) At least 20 percent but less than 40 percent impaired, limited or restricted    Occupational Therapy Progress Note  Dates of Reporting Period: 08/17/16 to Oct 16, 2016  Objective Reports of Subjective Statement: Pt demonstrates excellent progress towards goals. See goals.  Objective Measurements: see 9 hole peg test  Goal Update: Continue to work towards unmet long term goals.  Plan: Anticipate d/c next week.  Reason Skilled Services are Required: Pt can benefit from continued skilled occupational therapy to address: bradykinesia, rigidity, decreased balance, and decreased coordination in order to maximize independence with ADLs/IADLS.  Problem List Patient Active Problem List   Diagnosis Date Noted  . PD (Parkinson's disease) ([place]) 08/08/2016  . Loss of weight 07/25/2016  . DM (diabetes mellitus), type 2 with renal  complications managed by endocrinologist, Dr. Elyse Hsu 10/08/2012  . CKD (chronic kidney disease) stage 3, GFR 30-59 ml/min 10/08/2012  . Hypertension 08/24/2012  . Hypothyroid, s/p radioactive iodine tx for graves 2001 - managed by endocrinologist, Dr. Elyse Hsu 08/24/2012  . Hyperlipemia 08/24/2012  . Vitamin D deficiency 08/24/2012    Pamela Patton 10-16-2016, 11:27 AM Theone Murdoch, OTR/L [phone] Phone: [phone] 11:27 AM October 16, 2016 [facility] [address] [place], [place], [address] Phone: [phone]   Fax:  [phone]  Name: Pamela Patton MRN: [id number] Date of Birth: [birth year]

## 2016-10-05 NOTE — Patient Instructions (Signed)
  Abdominal breathing to generate volume and give your voice some power  Practice abdominal breathing throughout the day  Big breath before talking and breathe more frequently during conversations - be aware of taking bigger breaths  It's OK to feel like you are talking too loud - be aware of making an effort to project your voice - especially in loud environments such as restaurant, parties, loud appliances, TV, music, church group  Practice reciting things you have memorized to focus on big breaths and loud volume  You can use a loose rubber band around your wrist to remind you to practice breathing and talking

## 2016-10-05 NOTE — Therapy (Signed)
[facility] [address] [place], [place], [address] Phone: [phone]   Fax:  [phone]  Physical Therapy Treatment  Patient Details  Name: Pamela Patton MRN: [id number] Date of Birth: [birth year] Referring Provider: [name], DO  Encounter Date: 10/05/2016      PT End of Session - 10/05/16 1717    Visit Number 11   Number of Visits 13   Date for PT Re-Evaluation 10/17/16   Authorization Type UHC Medicare-GCODE every 10th visit   PT Start Time 0933   PT Stop Time 1014   PT Time Calculation (min) 41 min   Equipment Utilized During Treatment Gait belt   Activity Tolerance Patient tolerated treatment well   Behavior During Therapy [facility] for tasks assessed/performed      Past Medical History:  Diagnosis Date  . Anemia   . Arthritis   . Cancer [facility])    fibroid tumor  . Cataract    immature   . Chest pain summer 2012  . Chicken pox   . Diverticulosis    difficulty with colonoscopy 2005  . 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 Elyse Hsu 10/08/2012  . Fracture    radial/ulnar 03/2016  . GERD (gastroesophageal reflux disease)   . Heart murmur    innocent murmur  . High cholesterol   . Hypertension   . Hypothyroid, s/p radioactive iodine tx for graves 2001 08/24/2012  . Intestinal metaplasia of gastric mucosa   . Kidney disease   . Renal cancer ([place])   . UTI (urinary tract infection)   . Weight loss    eval with GI in 2016; s/p CT/MRI and PET scans    Past Surgical History:  Procedure Laterality Date  . ABDOMINAL HYSTERECTOMY  1989  . COLONOSCOPY    . DG BARIUM SWALLOW ([place] HX)     9 yrs ago per pt due to incomplete colonsocopy    There were no vitals filed for this visit.      Subjective Assessment - 10/05/16 0936    Subjective Things are going pretty good.  Took a few days off for the holidays, and I've gotten back into doing the exercises.    Pertinent History DM, fracture L ulna/radius 03/2016, GERD, HTN   Patient Stated Goals Pt's goal for therapy is to help with strength and coordination.   Currently in Pain? No/denies                         [facility] Adult PT Treatment/Exercise - 10/05/16 0940      Ambulation/Gait   Ambulation/Gait Yes   Ambulation/Gait Assistance 5: Supervision;6: Modified independent (Device/Increase time)   Ambulation/Gait Assistance Details Provided facilitation through bilateral walking poles for improved reciprocal arm swing; upon finishing with use of bilateral walking poles, PT provided facilitation through bilateral shoulders for improved initiation of trunk rotation and reciprocal arm swing with gait.   Ambulation Distance (Feet) 600 Feet  then 500   Assistive device None  Utilized bilateral walking poles to facilitate arm swing   Gait Pattern Decreased arm swing - right;Decreased arm swing - left;Decreased trunk rotation;Trunk flexed;Step-through pattern;Decreased step length - right;Decreased step length - left   Ambulation Surface Level;Indoor     Self-Care   Self-Care Other Self-Care Comments   Other Self-Care Comments  Discussed and provided information on community PD fitness options-[employer] cycling classes, PWR! Moves exercise classes  PWR [facility]) - 10/05/16 [id number]    PWR! Up x 10   PWR! Rock x 10 reps each side   PWR! Twist x 10 reps each side   PWR! Step x 10 reps each side   Comments Verbal and visual cues for technique and for intensity        Self Care continued- Briefly discussed POC; provided patient with community fitness options and pt would like to continue to work on gait and balance; Will likely extend POC through next week to address the above.     PT Education - 10/05/16 1716    Education provided Yes   Education Details Provided handout and instructions for PWR! Moves in prone; discussed community fitness options   Person(s) Educated  Patient   Methods Explanation;Handout   Comprehension Verbalized understanding          PT Short Term Goals - 09/01/16 0920      PT SHORT TERM GOAL #1   Title Pt will be independent with HEP for improved balance, posture, gait for improved functional mobility.  TARGET 09/01/16   Status Achieved     PT SHORT TERM GOAL #2   Title Pt will perform at least 8 of 10 reps of sit<>stand transfers with minimal UE support with no posterior lean.   Status Achieved     PT SHORT TERM GOAL #3   Title Pt will improve single limb stance to at least 3 seconds bilateral lower extremities for improved step and obstacle negotiation.   Status Achieved           PT Long Term Goals - 08/21/16 1432      PT LONG TERM GOAL #1   Title Pt will verbalize understanding of fall prevention in home environment.  TARGET 10/01/16   Time 6   Period Weeks   Status New     PT LONG TERM GOAL #2   Title Pt will improve TUG and TUG manual scores to less than 10% difference, for improved dual tasking with gait.   Time 6   Period Weeks   Status New     PT LONG TERM GOAL #3   Title Pt will negotiate at least 4 steps with handrails, step through pattern, modified independently for safe stair negotiation at home.   Time 6   Period Weeks   Status New     PT LONG TERM GOAL #4   Title Pt will ambulate outdoor surfaces, at least 1000 ft with appropriate assistive device, modified independently for improved community gait.   Time 6   Period Weeks   Status New     PT LONG TERM GOAL #5   Title Pt will verbalize plans for continued community fitness upon D/C from PT.   Time 6   Period Weeks   Status New               Plan - 10/05/16 1717    Clinical Impression Statement Worked on and provided PWR!  Moves in prone as part of HEP and discussed transition to community fitness classes.  Pt responded well to cues for trunk rotation initiating from shoulders, for improved arm swing.  Will continue to  benefit from further skilled PT to address balance, gait and posture activities.   PT Treatment/Interventions ADLs/Self Care Home Management;Functional mobility training;Gait training;DME Instruction;Therapeutic activities;Therapeutic exercise;Balance training;Neuromuscular re-education;Patient/family education;Stair training   PT Next Visit Plan Review PWR! moves [place]; work on ankle/hip/step strategies, gait activities for intensity of movement patterns (  treadmill and walking pole facilitation), posture with standing and gait activities  Likely renew POC to cover next week   Consulted and Agree with Plan of Care Patient      Patient will benefit from skilled therapeutic intervention in order to improve the following deficits and impairments:  Abnormal gait, Decreased balance, Decreased mobility, Decreased strength, Difficulty walking, Impaired tone, Postural dysfunction  Visit Diagnosis: Other abnormalities of gait and mobility  Abnormal posture     Problem List Patient Active Problem List   Diagnosis Date Noted  . PD (Parkinson's disease) ([place]) 08/08/2016  . Loss of weight 07/25/2016  . 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 Elyse Hsu 10/08/2012  . CKD (chronic kidney disease) stage 3, GFR 30-59 ml/min 10/08/2012  . Hypertension 08/24/2012  . Hypothyroid, s/p radioactive iodine tx for graves 2001 - managed by endocrinologist, Dr. Elyse Hsu 08/24/2012  . Hyperlipemia 08/24/2012  . Vitamin D deficiency 08/24/2012    Hoover Grewe W. 10/05/2016, 5:21 PM  Frazier Butt., PT  [facility] [address] [place], [place], [address] Phone: [phone]   Fax:  [phone]  Name: Pamela Patton MRN: [id number] Date of Birth: 11/30/1945

## 2016-10-05 NOTE — Therapy (Signed)
[facility] [address] [place], [place], [address] Phone: [phone]   Fax:  [phone]  Speech Language Pathology Treatment  Patient Details  Name: Pamela Patton MRN: [id number] Date of Birth: Nov 17, 1945 Referring Provider: [name], DO  Encounter Date: 10/05/2016      End of Session - 10/05/16 1411    Visit Number 2   Number of Visits 17   Date for SLP Re-Evaluation 12/09/16   SLP Start Time 1016   SLP Stop Time  1100   SLP Time Calculation (min) 44 min   Activity Tolerance Patient tolerated treatment well      Past Medical History:  Diagnosis Date  . Anemia   . Arthritis   . Cancer [facility])    fibroid tumor  . Cataract    immature   . Chest pain summer 2012  . Chicken pox   . Diverticulosis    difficulty with colonoscopy 2005  . 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 Elyse Hsu 10/08/2012  . Fracture    radial/ulnar 03/2016  . GERD (gastroesophageal reflux disease)   . Heart murmur    innocent murmur  . High cholesterol   . Hypertension   . Hypothyroid, s/p radioactive iodine tx for graves 2001 08/24/2012  . Intestinal metaplasia of gastric mucosa   . Kidney disease   . Renal cancer ([place])   . UTI (urinary tract infection)   . Weight loss    eval with GI in 2016; s/p CT/MRI and PET scans    Past Surgical History:  Procedure Laterality Date  . ABDOMINAL HYSTERECTOMY  1989  . COLONOSCOPY    . DG BARIUM SWALLOW ([place] HX)     9 yrs ago per pt due to incomplete colonsocopy    There were no vitals filed for this visit.             ADULT SLP TREATMENT - 10/05/16 1021      General Information   Behavior/Cognition Alert;Cooperative;Pleasant mood     Treatment Provided   Treatment provided Cognitive-Linquistic     Pain Assessment   Pain Assessment No/denies pain     Cognitive-Linquistic Treatment   Treatment focused on Dysarthria    Skilled Treatment Trained pt in diaphragmatic breathing for breath support for volume - with rare min A. Pt required occasional min verbal and visual cues and modeling to use breath support for volume in structured speech tasks     Assessment / Recommendations / Plan   Plan Continue with current plan of care     Progression Toward Goals   Progression toward goals Progressing toward goals          SLP Education - 10/05/16 1101    Education provided Yes   Education Details breath support for volume, compensations for dysarthria   Person(s) Educated Patient   Methods Explanation;Demonstration;Verbal cues   Comprehension Verbalized understanding;Returned demonstration          SLP Short Term Goals - 10/05/16 1410      SLP SHORT TERM GOAL #1   Title pt will demo appropriate breath support in 19/20 sentence responses over three sessions   Time 4   Period Weeks   Status On-going     SLP SHORT TERM GOAL #2   Title pt will complete HEP for dysarthria with rare min A   Time 4   Period Weeks   Status On-going     SLP SHORT TERM  GOAL #3   Title pt will respond to structured speech tasks with sentences in a noisy environment with 100% intelligibility over three sessions   Time 4   Period Weeks   Status On-going          SLP Long Term Goals - 10/05/16 1410      SLP LONG TERM GOAL #1   Title pt will demo swallow precautions from modified barium swallow (MBS) with POs PRN during session with 95% success   Time 8   Period Weeks   Status On-going     SLP LONG TERM GOAL #2   Title pt will produce conversational speech appropriate for 100% intelligibilty in 20 minutes conversation in noisy environment   Time 8   Period Weeks   Status On-going     SLP LONG TERM GOAL #3   Title pt will demo appropriate abdominal breath support in 20 minutes conversation   Time 8   Period Weeks   Status On-going          Plan - 10/05/16 1408    Clinical Impression Statement  Initiated training for abdominal breathing to support volume with min A. Pt able to carryover breath support and volume in structured tasks with min A. She reports she has MBSS scheduled for next week. Continued skilled ST to carryover compensations for improved intellgibility and swallowing.    Speech Therapy Frequency 2x / week   Treatment/Interventions Aspiration precaution training;Pharyngeal strengthening exercises;Diet toleration management by SLP;Cueing hierarchy;Internal/external aids;SLP instruction and feedback;Functional tasks;Patient/family education;Compensatory strategies;Oral motor exercises   Potential to Achieve Goals Good   Consulted and Agree with Plan of Care Patient      Patient will benefit from skilled therapeutic intervention in order to improve the following deficits and impairments:   Dysarthria and anarthria    Problem List Patient Active Problem List   Diagnosis Date Noted  . PD (Parkinson's disease) ([place]) 08/08/2016  . Loss of weight 07/25/2016  . 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 Elyse Hsu 10/08/2012  . CKD (chronic kidney disease) stage 3, GFR 30-59 ml/min 10/08/2012  . Hypertension 08/24/2012  . Hypothyroid, s/p radioactive iodine tx for graves 2001 - managed by endocrinologist, Dr. Elyse Hsu 08/24/2012  . Hyperlipemia 08/24/2012  . Vitamin D deficiency 08/24/2012    Lovvorn, Annye Rusk MS, CCC-SLP 10/05/2016, 2:12 PM  [facility] [address] [place], [place], [address] Phone: [phone]   Fax:  [phone]   Name: Pamela Patton MRN: [id number] Date of Birth: 1946/01/31

## 2016-10-07 ENCOUNTER — Ambulatory Visit: Payer: Medicare Other | Admitting: Occupational Therapy

## 2016-10-07 ENCOUNTER — Ambulatory Visit: Payer: Medicare Other

## 2016-10-07 ENCOUNTER — Ambulatory Visit: Payer: Medicare Other | Admitting: Physical Therapy

## 2016-10-07 DIAGNOSIS — R293 Abnormal posture: Secondary | ICD-10-CM

## 2016-10-07 DIAGNOSIS — R471 Dysarthria and anarthria: Secondary | ICD-10-CM

## 2016-10-07 DIAGNOSIS — R278 Other lack of coordination: Secondary | ICD-10-CM

## 2016-10-07 DIAGNOSIS — R1310 Dysphagia, unspecified: Secondary | ICD-10-CM

## 2016-10-07 DIAGNOSIS — R29898 Other symptoms and signs involving the musculoskeletal system: Secondary | ICD-10-CM

## 2016-10-07 DIAGNOSIS — R2689 Other abnormalities of gait and mobility: Secondary | ICD-10-CM | POA: Diagnosis not present

## 2016-10-07 DIAGNOSIS — R2681 Unsteadiness on feet: Secondary | ICD-10-CM

## 2016-10-07 DIAGNOSIS — R29818 Other symptoms and signs involving the nervous system: Secondary | ICD-10-CM

## 2016-10-07 NOTE — Therapy (Signed)
[facility] [address] [place], [place], [address] Phone: [phone]   Fax:  [phone]  Physical Therapy Treatment  Patient Details  Name: Pamela Patton MRN: [id number] Date of Birth: Apr 12, 1946 Referring Provider: [name], DO  Encounter Date: 10/07/2016      PT End of Session - 10/07/16 1345    Visit Number 12   Number of Visits 14   Date for PT Re-Evaluation 10/17/16   Authorization Type UHC Medicare-GCODE every 10th visit   PT Start Time 0939   PT Stop Time 1018   PT Time Calculation (min) 39 min   Activity Tolerance Patient tolerated treatment well   Behavior During Therapy [facility] for tasks assessed/performed      Past Medical History:  Diagnosis Date  . Anemia   . Arthritis   . Cancer [facility])    fibroid tumor  . Cataract    immature   . Chest pain summer 2012  . Chicken pox   . Diverticulosis    difficulty with colonoscopy 2005  . 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 Elyse Hsu 10/08/2012  . Fracture    radial/ulnar 03/2016  . GERD (gastroesophageal reflux disease)   . Heart murmur    innocent murmur  . High cholesterol   . Hypertension   . Hypothyroid, s/p radioactive iodine tx for graves 2001 08/24/2012  . Intestinal metaplasia of gastric mucosa   . Kidney disease   . Renal cancer ([place])   . UTI (urinary tract infection)   . Weight loss    eval with GI in 2016; s/p CT/MRI and PET scans    Past Surgical History:  Procedure Laterality Date  . ABDOMINAL HYSTERECTOMY  1989  . COLONOSCOPY    . DG BARIUM SWALLOW ([place] [facility])     9 yrs ago per pt due to incomplete colonsocopy    There were no vitals filed for this visit.      Subjective Assessment - 10/07/16 0941    Subjective Nothing new this week.  Going along pretty good.   Pertinent History DM, fracture L ulna/radius 03/2016, GERD, HTN   Patient Stated Goals Pt's goal for therapy is to  help with strength and coordination.   Currently in Pain? No/denies                         [facility] Adult PT Treatment/Exercise - 10/07/16 0001      Transfers   Transfers Sit to Stand;Stand to Sit   Sit to Stand 5: Supervision;Without upper extremity assist;From chair/3-in-1   Sit to Stand Details (indicate cue type and reason) Needs cues for foot placement, increased forward lean, upright posture to stand   Five time sit to stand comments  19.33; then 17.22 with cues for increased amplitude of movement.   Number of Reps 10 reps  from 18 inch chair height     Standardized Balance Assessment   Standardized Balance Assessment Timed Up and Go Test     Timed Up and Go Test   TUG Normal TUG;Manual TUG;Cognitive TUG   Normal TUG (seconds) 12.96   Manual TUG (seconds) 13.11     Knee/Hip Exercises: Aerobic   Nustep SciFit, Level 3 x 8 minutes, 4 extemities, with cues for RPM >60 .  Cues for increased intensity of movement patterns and exercise           P[facility]) - 10/07/16 1003  PWR! exercises Moves in prone   PWR! Up x 20   PWR! Rock x 10 reps each side, cues for weightshifting side to side to allow for unweighting of UEs  Pt has difficulty with WB on L shoulder, lifting R arm today   PWR! Twist x 10 reps each side, with cues for proper positioning and technique; cues for use of eyes and head for improved full body movement patterns   PWR! Step x 10 reps each side, with cues for technique   Comments Review of prone PWR! Moves given last visit.  Pt needs frequent cues for positioning, technique and for intensity of movement patterns.               PT Short Term Goals - 09/01/16 0920      PT SHORT TERM GOAL #1   Title Pt will be independent with HEP for improved balance, posture, gait for improved functional mobility.  TARGET 09/01/16   Status Achieved     PT SHORT TERM GOAL #2   Title Pt will perform at least 8 of 10 reps of sit<>stand transfers with  minimal UE support with no posterior lean.   Status Achieved     PT SHORT TERM GOAL #3   Title Pt will improve single limb stance to at least 3 seconds bilateral lower extremities for improved step and obstacle negotiation.   Status Achieved           PT Long Term Goals - 10/07/16 1345      PT LONG TERM GOAL #1   Title Pt will verbalize understanding of fall prevention in home environment.  TARGET 10/01/16; UPDATED TARGET 10/14/16   Time 1  per recert 05/06/68   Period Weeks   Status On-going     PT LONG TERM GOAL #2   Title Pt will improve TUG and TUG manual scores to less than 10% difference, for improved dual tasking with gait.   Time 6   Period Weeks   Status Achieved     PT LONG TERM GOAL #3   Title Pt will negotiate at least 4 steps with handrails, step through pattern, modified independently for safe stair negotiation at home.  UPDATED TARGET 04/01/51   Time 1  recert 05/06/12   Period Weeks   Status On-going     PT LONG TERM GOAL #4   Title Pt will ambulate outdoor surfaces, at least 1000 ft with appropriate assistive device, modified independently for improved community gait.  UPDATED TARGET 10/14/16   Time 1  per recert 11/06/38   Period Weeks   Status On-going     PT LONG TERM GOAL #5   Title Pt will verbalize plans for continued community fitness upon D/C from PT.   Time 6   Period Weeks   Status Achieved               Plan - 10/07/16 1347    Clinical Impression Statement Pt has met LTG 2 and 5.  LTG 1, 3, 4 remain ongoing for one additional week of POC (due to missing some appts due to the holidays), in preparation for discharge next week and plan to transition to community fitness classes.  Pt demo improved TUG scores, but continues to need cues for intensity and amplitude of movement patterns with exercises and transfers.  Pt will continue to benefit from further skilled PT to address balance, posture, gait for imrpoved functional mobility.   PT  Frequency 2x / week  PT Duration --  1 week, per recert 05/07/71   PT Treatment/Interventions ADLs/Self Care Home Management;Functional mobility training;Gait training;DME Instruction;Therapeutic activities;Therapeutic exercise;Balance training;Neuromuscular re-education;Patient/family education;Stair training   PT Next Visit Plan Work on ankle/hip/step strategies, gait activities for intensity of movement patterns ( walking pole facilitation); prepare for discharge next week  Likely renew POC to cover next week   Consulted and Agree with Plan of Care Patient      Patient will benefit from skilled therapeutic intervention in order to improve the following deficits and impairments:  Abnormal gait, Decreased balance, Decreased mobility, Decreased strength, Difficulty walking, Impaired tone, Postural dysfunction  Visit Diagnosis: Other abnormalities of gait and mobility  Abnormal posture  Other symptoms and signs involving the nervous system  Unsteadiness on feet     Problem List Patient Active Problem List   Diagnosis Date Noted  . PD (Parkinson's disease) ([place]) 08/08/2016  . Loss of weight 07/25/2016  . 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 Elyse Hsu 10/08/2012  . CKD (chronic kidney disease) stage 3, GFR 30-59 ml/min 10/08/2012  . Hypertension 08/24/2012  . Hypothyroid, s/p radioactive iodine tx for graves 2001 - managed by endocrinologist, Dr. Elyse Hsu 08/24/2012  . Hyperlipemia 08/24/2012  . Vitamin D deficiency 08/24/2012    Severus Brodzinski W. 10/07/2016, 1:53 PM  Frazier Butt., PT  [facility] [address] [place], [place], [address] Phone: [phone]   Fax:  [phone]  Name: Pamela Patton MRN: [id number] Date of Birth: July 08, 1946

## 2016-10-07 NOTE — Patient Instructions (Signed)
Keeping Thinking Skills Sharp: 1. Jigsaw puzzles 2. Card/board games 3. Talking on the phone/social events 4. Lumosity.com 5. Online games 6. Word serches/crossword puzzles 7.  Logic puzzles 8. Aerobic exercise (stationary bike) 9. Eating balanced diet (fruits & veggies) 10. Drink water 11. Try something new--new recipe, hobby 12. Crafts 13. Do a variety of activities that are challenging 14. Add cognitive activities to walking/exercising (think of animal/food/city with each letter of the alphabet, counting backwards, thinking of as many vegetables as you can, etc.).--Only do this  If safe (no freezing/falls).  

## 2016-10-07 NOTE — Therapy (Signed)
[facility] [address] [place], [place], [address] Phone: [phone]   Fax:  [phone]  Speech Language Pathology Treatment  Patient Details  Name: Pamela Patton MRN: [id number] Date of Birth: 08/08/1946 Referring Provider: [name], DO  Encounter Date: 10/07/2016      End of Session - 10/07/16 1555    Visit Number 3   Number of Visits 17   Date for SLP Re-Evaluation 12/09/16   SLP Start Time 0803   SLP Stop Time  0845   SLP Time Calculation (min) 42 min   Activity Tolerance Patient tolerated treatment well      Past Medical History:  Diagnosis Date  . Anemia   . Arthritis   . Cancer [facility])    fibroid tumor  . Cataract    immature   . Chest pain summer 2012  . Chicken pox   . Diverticulosis    difficulty with colonoscopy 2005  . 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 Elyse Hsu 10/08/2012  . Fracture    radial/ulnar 03/2016  . GERD (gastroesophageal reflux disease)   . Heart murmur    innocent murmur  . High cholesterol   . Hypertension   . Hypothyroid, s/p radioactive iodine tx for graves 2001 08/24/2012  . Intestinal metaplasia of gastric mucosa   . Kidney disease   . Renal cancer ([place])   . UTI (urinary tract infection)   . Weight loss    eval with GI in 2016; s/p CT/MRI and PET scans    Past Surgical History:  Procedure Laterality Date  . ABDOMINAL HYSTERECTOMY  1989  . COLONOSCOPY    . DG BARIUM SWALLOW ([place] [facility]X)     9 yrs ago per pt due to incomplete colonsocopy    There were no vitals filed for this visit.      Subjective Assessment - 10/07/16 0832    Subjective "I guess I will start telling myself stories."               ADULT SLP TREATMENT - 10/07/16 0839      General Information   Behavior/Cognition Alert;Cooperative;Pleasant mood     Treatment Provided   Treatment provided Cognitive-Linquistic     Cognitive-Linquistic Treatment   Treatment focused on Dysarthria   Skilled Treatment Pt maintained abdominal breathing (AB) in sentences 80%. Pt was mostly unaware of when she did not achieve AB - req'd SLP min A occasionally. In 8 and 13 minute conversations pt maintained 70dB 80% of the time. Reviewed external cues for loudness and AB.     Assessment / Recommendations / Plan   Plan Continue with current plan of care     Progression Toward Goals   Progression toward goals Progressing toward goals            SLP Short Term Goals - 10/07/16 1557      SLP SHORT TERM GOAL #1   Title pt will demo appropriate breath support in 19/20 sentence responses over three sessions   Baseline 10-07-16   Time 4   Period Weeks   Status On-going     SLP SHORT TERM GOAL #2   Title pt will complete HEP for dysarthria with rare min A   Time 4   Period Weeks   Status On-going     SLP SHORT TERM GOAL #3   Title pt will respond to structured speech tasks with sentences in a noisy environment with 100%  intelligibility over three sessions   Time 4   Period Weeks   Status On-going          SLP Long Term Goals - 10/07/16 1557      SLP LONG TERM GOAL #1   Title pt will demo swallow precautions from modified barium swallow (MBS) with POs PRN during session with 95% success   Time 8   Period Weeks   Status On-going     SLP LONG TERM GOAL #2   Title pt will produce conversational speech appropriate for 100% intelligibilty in 20 minutes conversation in noisy environment   Time 8   Period Weeks   Status On-going     SLP LONG TERM GOAL #3   Title pt will demo appropriate abdominal breath support in 20 minutes conversation   Time 8   Period Weeks   Status On-going          Plan - 10/07/16 1555    Clinical Impression Statement Pt able to carryover breath support and volume in structured tasks with SBA. Her volume in conversation is appropriate for shorter conversation and in longer  conversation with increasing need for SLP cues. Continued skilled ST to carryover compensations for improved intellgibility and swallowing.    Speech Therapy Frequency 2x / week   Duration --  8 weeks   Treatment/Interventions Aspiration precaution training;Pharyngeal strengthening exercises;Diet toleration management by SLP;Cueing hierarchy;Internal/external aids;SLP instruction and feedback;Functional tasks;Patient/family education;Compensatory strategies;Oral motor exercises   Potential to Achieve Goals Good   Consulted and Agree with Plan of Care Patient      Patient will benefit from skilled therapeutic intervention in order to improve the following deficits and impairments:   Dysarthria and anarthria  Dysphagia, unspecified type    Problem List Patient Active Problem List   Diagnosis Date Noted  . PD (Parkinson's disease) ([place]) 08/08/2016  . Loss of weight 07/25/2016  . 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 Elyse Hsu 10/08/2012  . CKD (chronic kidney disease) stage 3, GFR 30-59 ml/min 10/08/2012  . Hypertension 08/24/2012  . Hypothyroid, s/p radioactive iodine tx for graves 2001 - managed by endocrinologist, Dr. Elyse Hsu 08/24/2012  . Hyperlipemia 08/24/2012  . Vitamin D deficiency 08/24/2012    [facility] ,MS, CCC-SLP  10/07/2016, 3:58 PM  [facility] [address] [place], [place], [address] Phone: [phone]   Fax:  [phone]   Name: Pamela Patton MRN: [id number] Date of Birth: Dec 18, 1945

## 2016-10-07 NOTE — Therapy (Signed)
[facility] [address] [place], [place], [address] Phone: [phone]   Fax:  [phone]  Occupational Therapy Treatment  Patient Details  Name: Pamela Patton MRN: [id number] Date of Birth: June 21, 1946 Referring Provider: Dr Carles Collet  Encounter Date: 10/07/2016      OT End of Session - 10/07/16 1114    Visit Number 11   Number of Visits 17   Date for OT Re-Evaluation 10/15/16   Authorization Type UHC MCR   Authorization - Visit Number 11   Authorization - Number of Visits 20   OT Start Time 0850   OT Stop Time 0930   OT Time Calculation (min) 40 min   Activity Tolerance Patient tolerated treatment well   Behavior During Therapy [facility] for tasks assessed/performed      Past Medical History:  Diagnosis Date  . Anemia   . Arthritis   . Cancer [facility])    fibroid tumor  . Cataract    immature   . Chest pain summer 2012  . Chicken pox   . Diverticulosis    difficulty with colonoscopy 2005  . 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 Elyse Hsu 10/08/2012  . Fracture    radial/ulnar 03/2016  . GERD (gastroesophageal reflux disease)   . Heart murmur    innocent murmur  . High cholesterol   . Hypertension   . Hypothyroid, s/p radioactive iodine tx for graves 2001 08/24/2012  . Intestinal metaplasia of gastric mucosa   . Kidney disease   . Renal cancer ([place])   . UTI (urinary tract infection)   . Weight loss    eval with GI in 2016; s/p CT/MRI and PET scans    Past Surgical History:  Procedure Laterality Date  . ABDOMINAL HYSTERECTOMY  1989  . COLONOSCOPY    . DG BARIUM SWALLOW ([place] [facility])     9 yrs ago per pt due to incomplete colonsocopy    There were no vitals filed for this visit.      Subjective Assessment - 10/07/16 1121    Subjective  Denies pain   Pertinent History see Epic   Patient Stated Goals to maintain indepenence   Currently in Pain? No/denies                 Education provided regarding community resources, prevent of future PD related complications, and keeping thinking skills sharp. Pt verbalized understanding. Arm bike x 5 mins, for conditioning. Pt was able to maintain 30 rpm. Dynamic step and reach to copy small peg design, (for cognitive component) min difficulty/ v.c. For larger amplitude movements         [facility]) -    PWR! exercises Moves in supine   PWR! Up x 10   PWR! Rock x 10 reps each side   PWR! Twist PWR! step x 10 reps each side X 10 reps               OT Short Term Goals - 10/05/16 0856      OT SHORT TERM GOAL #1   Title I with PD specific HEP.   Time 4   Period Weeks   Status Achieved     OT SHORT TERM GOAL #2   Title Pt will verbalize understanding of adapted strategies for ADLs/ IADLs.   Time 4   Period Weeks   Status Achieved     OT SHORT TERM GOAL #3   Title Pt will  improve LUE fine motor coordiantion as evidenced by performing 9 hole peg test in 65 secs or less.   Baseline LUE 80.81 secs, RUE 48.32 secs   Time 4   Period Weeks   Status Achieved  46.31 secs     OT SHORT TERM GOAL #4   Title Pt will demonstrate improved ease with dressing as evidenced by performing PPT#4 (donning/ doffing jacket ) in 60 secs or less.   Time 4   Period Weeks   Status Achieved  43.47 secs     OT SHORT TERM GOAL #5   Title Pt will demonstrate improved RUE fine motor coordination as evidenced by decreasing 9 hole peg test score to 43 secs or less.   Time 4   Period Weeks   Status Achieved  35.44 secs     OT SHORT TERM GOAL #6   Title assess handwriting and set goal PRN.   Time 4   Period Weeks   Status Deferred  good size and legibility, pt provided with hand writing suggestions in case she nees tham in the future           OT Long Term Goals - 10/07/16 0917      OT LONG TERM GOAL #1   Title Pt will verbalize understanding of ways to prevent future PD related  complications and verbalize understanding of community resources.   Time 8   Period Weeks   Status Achieved     OT LONG TERM GOAL #2   Title Pt will demonstrate ability to retrieve a lightweight object at 120 shoulder flexion with -20 elbow ext for LUE.   Baseline 115, -28   Time 8   Period Weeks   Status Achieved     OT LONG TERM GOAL #3   Title Pt will demosntrate ability to retrieve an item at 130 shoulder flexion and -20 elbow ext for RUE.   Baseline 130, -30   Time 8   Period Weeks   Status Achieved     OT LONG TERM GOAL #4   Title Pt will decrease 3 button / unbutton test to 90 secs or less.   Baseline 2 mins, 4 secs   Time 8   Period Weeks   Status On-going     OT LONG TERM GOAL #5   Title Pt will decrease PPT#2 (simulated feeding) to 16 secs or less.   Time 8   Period Weeks   Status On-going     OT LONG TERM GOAL #6   Title Pt will verbalize understanding of ways to keep thinking skills sharp   Time 8   Period Weeks   Status Achieved               Plan - 10/07/16 [id number]    Clinical Impression Statement Pt is progressing towards long term goals. She demonstrates understanding of PWR! supine and strategies to keep thinking skills sharp.   OT Frequency 2x / week   OT Duration 8 weeks   OT Treatment/Interventions Self-care/ADL training;Therapeutic exercise;Cognitive remediation/compensation;Visual/perceptual remediation/compensation;Splinting;Neuromuscular education;Moist Heat;Parrafin;Fluidtherapy;Energy conservation;Therapeutic exercises;Patient/family education;Balance training;Therapeutic activities;Passive range of motion;Manual Therapy;DME and/or AE instruction;Contrast Bath;Ultrasound;Cryotherapy   Plan check long term goals next week, aniticipate d/c   OT Home Exercise Plan issued PWR! seated, basic coordiantion HEP., modified quadraped, PWR! hands, supine   Consulted and Agree with Plan of Care Patient      Patient will benefit from skilled  therapeutic intervention in order to improve the following deficits and impairments:  Visit Diagnosis: Other abnormalities of gait and mobility  Abnormal posture  Other symptoms and signs involving the nervous system  Other symptoms and signs involving the musculoskeletal system  Other lack of coordination    Problem List Patient Active Problem List   Diagnosis Date Noted  . PD (Parkinson's disease) ([place]) 08/08/2016  . Loss of weight 07/25/2016  . 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 Elyse Hsu 10/08/2012  . CKD (chronic kidney disease) stage 3, GFR 30-59 ml/min 10/08/2012  . Hypertension 08/24/2012  . Hypothyroid, s/p radioactive iodine tx for graves 2001 - managed by endocrinologist, Dr. Elyse Hsu 08/24/2012  . Hyperlipemia 08/24/2012  . Vitamin D deficiency 08/24/2012    RINE,KATHRYN 10/07/2016, 11:21 AM Theone Murdoch, OTR/L [phone] Phone: [phone] 11:21 AM 10/07/16 [facility] [address] [place], [place], [address] Phone: [phone]   Fax:  [phone]  Name: Pamela Patton MRN: [id number] Date of Birth: 05/13/1946

## 2016-10-10 ENCOUNTER — Ambulatory Visit: Payer: Medicare Other | Admitting: Speech Pathology

## 2016-10-10 ENCOUNTER — Encounter: Payer: Self-pay | Admitting: Speech Pathology

## 2016-10-10 ENCOUNTER — Ambulatory Visit: Payer: Medicare Other | Admitting: Occupational Therapy

## 2016-10-10 ENCOUNTER — Ambulatory Visit: Payer: Medicare Other | Admitting: Physical Therapy

## 2016-10-10 DIAGNOSIS — R2681 Unsteadiness on feet: Secondary | ICD-10-CM

## 2016-10-10 DIAGNOSIS — R1310 Dysphagia, unspecified: Secondary | ICD-10-CM

## 2016-10-10 DIAGNOSIS — R278 Other lack of coordination: Secondary | ICD-10-CM

## 2016-10-10 DIAGNOSIS — R29818 Other symptoms and signs involving the nervous system: Secondary | ICD-10-CM

## 2016-10-10 DIAGNOSIS — R471 Dysarthria and anarthria: Secondary | ICD-10-CM

## 2016-10-10 DIAGNOSIS — R293 Abnormal posture: Secondary | ICD-10-CM

## 2016-10-10 DIAGNOSIS — R2689 Other abnormalities of gait and mobility: Secondary | ICD-10-CM

## 2016-10-10 DIAGNOSIS — R29898 Other symptoms and signs involving the musculoskeletal system: Secondary | ICD-10-CM

## 2016-10-10 NOTE — Therapy (Signed)
[facility] [address] [place], [place], [address] Phone: [phone]   Fax:  [phone]  Occupational Therapy Treatment  Patient Details  Name: Pamela Patton MRN: [id number] Date of Birth: [birth year] Referring Provider: Dr Carles Collet  Encounter Date: 10/10/2016      OT End of Session - 10/10/16 1020    Visit Number 12   Number of Visits 17   Date for OT Re-Evaluation 10/15/16   Authorization Type [employer] MCR   Authorization - Visit Number 12   Authorization - Number of Visits 20   OT Start Time [id number]   OT Stop Time 1100   OT Time Calculation (min) 42 min   Activity Tolerance Patient tolerated treatment well   Behavior During Therapy [facility] for tasks assessed/performed      Past Medical History:  Diagnosis Date  . Anemia   . Arthritis   . Cancer [facility])    fibroid tumor  . Cataract    immature   . Chest pain summer 2012  . Chicken pox   . Diverticulosis    difficulty with colonoscopy 2005  . 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 Elyse Hsu 10/08/2012  . Fracture    radial/ulnar 03/2016  . GERD (gastroesophageal reflux disease)   . Heart murmur    innocent murmur  . High cholesterol   . Hypertension   . Hypothyroid, s/p radioactive iodine tx for graves 2001 08/24/2012  . Intestinal metaplasia of gastric mucosa   . Kidney disease   . Renal cancer ([place])   . UTI (urinary tract infection)   . Weight loss    eval with GI in 2016; s/p CT/MRI and PET scans    Past Surgical History:  Procedure Laterality Date  . ABDOMINAL HYSTERECTOMY  1989  . COLONOSCOPY    . DG BARIUM SWALLOW ([place] HX)     9 yrs ago per pt due to incomplete colonsocopy    There were no vitals filed for this visit.      Subjective Assessment - 10/10/16 1019    Subjective  "feeling pretty good"   Pertinent History see Epic   Patient Stated Goals to maintain indepenence   Currently in Pain?  No/denies         Neuro re-ed:  PWR! Moves (basic 4) in modified quadraped x 15-20 each with min cues For incr movement amplitude (spreading fingers, head/eye movements).  Arm bike x4 min level 1 for reciprocal movement with cues/target of at least 35-40 rpms for intensity while maintaining movement amplitude/reciprocal movement.   Pt maintained 25-33rpms.   Sliding cards off table by using PWR! Hands with focus on finger ext and min cues for incr movement amplitude with each hand.   Practiced buttoning/unbuttoning with use of PWR! Hands with min cueing.                         OT Education - 10/10/16 1047    Education Details Rationale/importance of re-eval/screen in approx 6 months; Reviewed importance of continuing HEP/using exercise as medicine to prevent future complications   Person(s) Educated Patient   Methods Explanation   Comprehension Verbalized understanding          OT Short Term Goals - 10/05/16 0856      OT SHORT TERM GOAL #1   Title I with PD specific HEP.   Time 4   Period Weeks   Status Achieved  OT SHORT TERM GOAL #2   Title Pt will verbalize understanding of adapted strategies for ADLs/ IADLs.   Time 4   Period Weeks   Status Achieved     OT SHORT TERM GOAL #3   Title Pt will improve LUE fine motor coordiantion as evidenced by performing 9 hole peg test in 65 secs or less.   Baseline LUE 80.81 secs, RUE 48.32 secs   Time 4   Period Weeks   Status Achieved  46.31 secs     OT SHORT TERM GOAL #4   Title Pt will demonstrate improved ease with dressing as evidenced by performing PPT#4 (donning/ doffing jacket ) in 60 secs or less.   Time 4   Period Weeks   Status Achieved  43.47 secs     OT SHORT TERM GOAL #5   Title Pt will demonstrate improved RUE fine motor coordination as evidenced by decreasing 9 hole peg test score to 43 secs or less.   Time 4   Period Weeks   Status Achieved  35.44 secs     OT SHORT TERM  GOAL #6   Title assess handwriting and set goal PRN.   Time 4   Period Weeks   Status Deferred  good size and legibility, pt provided with hand writing suggestions in case she nees tham in the future           OT Long Term Goals - 10/10/16 1055      OT LONG TERM GOAL #1   Title Pt will verbalize understanding of ways to prevent future PD related complications and verbalize understanding of community resources.   Time 8   Period Weeks   Status Achieved     OT LONG TERM GOAL #2   Title Pt will demonstrate ability to retrieve a lightweight object at 120 shoulder flexion with -20 elbow ext for LUE.   Baseline 115, -28   Time 8   Period Weeks   Status Achieved     OT LONG TERM GOAL #3   Title Pt will demosntrate ability to retrieve an item at 130 shoulder flexion and -20 elbow ext for RUE.   Baseline 130, -30   Time 8   Period Weeks   Status Achieved     OT LONG TERM GOAL #4   Title Pt will decrease 3 button / unbutton test to 90 secs or less.   Baseline 2 mins, 4 secs   Time 8   Period Weeks   Status On-going     OT LONG TERM GOAL #5   Title Pt will decrease PPT#2 (simulated feeding) to 16 secs or less.   Time 8   Period Weeks   Status On-going     OT LONG TERM GOAL #6   Title Pt will verbalize understanding of ways to keep thinking skills sharp   Time 8   Period Weeks   Status Achieved               Plan - 10/10/16 1020    Clinical Impression Statement Pt is progressing towards goals and verbalizes understanding of importance of continuing fitness/HEP post therapy d/c.    Rehab Potential Good   OT Frequency 2x / week   OT Duration 8 weeks   OT Treatment/Interventions Self-care/ADL training;Therapeutic exercise;Cognitive remediation/compensation;Visual/perceptual remediation/compensation;Splinting;Neuromuscular education;Moist Heat;Parrafin;Fluidtherapy;Energy conservation;Therapeutic exercises;Patient/family education;Balance training;Therapeutic  activities;Passive range of motion;Manual Therapy;DME and/or AE instruction;Contrast Bath;Ultrasound;Cryotherapy   Plan check remaining goals, d/c; schedule screen/re-eval in approx 6 months  OT Home Exercise Plan issued PWR! seated, basic coordiantion HEP., modified quadraped, PWR! hands, supine   Consulted and Agree with Plan of Care Patient      Patient will benefit from skilled therapeutic intervention in order to improve the following deficits and impairments:  Abnormal gait, Decreased cognition, Impaired vision/preception, Impaired flexibility, Decreased knowledge of use of DME, Decreased coordination, Decreased mobility, Impaired tone, Decreased strength, Decreased range of motion, Decreased endurance, Decreased activity tolerance, Decreased balance, Decreased knowledge of precautions, Difficulty walking, Impaired perceived functional ability, Impaired UE functional use  Visit Diagnosis: Other symptoms and signs involving the nervous system  Other symptoms and signs involving the musculoskeletal system  Other lack of coordination  Abnormal posture  Unsteadiness on feet  Other abnormalities of gait and mobility    Problem List Patient Active Problem List   Diagnosis Date Noted  . PD (Parkinson's disease) ([place]) 08/08/2016  . Loss of weight 07/25/2016  . 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 Elyse Hsu 10/08/2012  . CKD (chronic kidney disease) stage 3, GFR 30-59 ml/min 10/08/2012  . Hypertension 08/24/2012  . Hypothyroid, s/p radioactive iodine tx for graves 2001 - managed by endocrinologist, Dr. Elyse Hsu 08/24/2012  . Hyperlipemia 08/24/2012  . Vitamin D deficiency 08/24/2012    [facility] 10/10/2016, 11:02 AM  [facility] [address] [place], [place], [address] Phone: [phone]   Fax:  [phone]  Name: Pamela Patton MRN: [id number] Date of  Birth: 1945-11-25   Vianne Bulls, OTR/L [facility] [address] [place], [place]  [address] [phone] phone [phone] 10/10/16 11:02 AM

## 2016-10-10 NOTE — Therapy (Signed)
[facility] [address] [place], [place], [address] Phone: [phone]   Fax:  [phone]  Speech Language Pathology Treatment  Patient Details  Name: Pamela Patton MRN: [id number] Date of Birth: 12-13-1945 Referring Provider: [name], DO  Encounter Date: 10/10/2016      End of Session - 10/10/16 0936    Visit Number 4   Number of Visits 17   Date for SLP Re-Evaluation 12/09/16   SLP Start Time 0845   SLP Stop Time  0932   SLP Time Calculation (min) 47 min   Activity Tolerance Patient tolerated treatment well      Past Medical History:  Diagnosis Date  . Anemia   . Arthritis   . Cancer [facility])    fibroid tumor  . Cataract    immature   . Chest pain summer 2012  . Chicken pox   . Diverticulosis    difficulty with colonoscopy 2005  . 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 Elyse Hsu 10/08/2012  . Fracture    radial/ulnar 03/2016  . GERD (gastroesophageal reflux disease)   . Heart murmur    innocent murmur  . High cholesterol   . Hypertension   . Hypothyroid, s/p radioactive iodine tx for graves 2001 08/24/2012  . Intestinal metaplasia of gastric mucosa   . Kidney disease   . Renal cancer ([place])   . UTI (urinary tract infection)   . Weight loss    eval with GI in 2016; s/p CT/MRI and PET scans    Past Surgical History:  Procedure Laterality Date  . ABDOMINAL HYSTERECTOMY  1989  . COLONOSCOPY    . DG BARIUM SWALLOW ([place] HX)     9 yrs ago per pt due to incomplete colonsocopy    There were no vitals filed for this visit.      Subjective Assessment - 10/10/16 0857    Subjective Patient reports she has an MBS scheduled for this coming Thursday.   Currently in Pain? Yes   Pain Score 4    Pain Location Shoulder   Pain Orientation Left   Pain Onset Yesterday   Pain Frequency Occasional   Pain Relieving Factors repositioning   Multiple Pain  Sites No               ADULT SLP TREATMENT - 10/10/16 0001      General Information   Behavior/Cognition Alert;Cooperative;Pleasant mood   Patient Positioning Upright in chair     Treatment Provided   Treatment provided Cognitive-Linquistic     Pain Assessment   Pain Assessment --   Pain Score --   Pain Location --   Pain Intervention(s) --     Cognitive-Linquistic Treatment   Treatment focused on Dysarthria   Skilled Treatment Provided skilled instruction and feedback in abdominal breathing(AB) for improved breath support and efficiency during structured sentence-level speech tasks. Patient required min verbal and visual cues and benefitted from clinician modeling to utilize AB. She is demonstrating improved awareness of AB. Provided training and feedback for increased effort to maintain vocal quality across length of utterances. Following initial instruction, patient implements vocal effort with 90% accuracy at sentence level for improved quality at the end of utterances. Progressed to multi-paragraph level tasks with increased cognitive load; while vocal quality remains good, noted reduction in amplitude. Patient is observed to implement AB approx 25% of the time at [place] level. Patient will benefit from training to maintain effort and  AB across higher level cognitive tasks. During spontaneous conversation, patient demonstrates emerging integration of instructed techniques and will benefit from continued training to improve carryover.     Assessment / Recommendations / Plan   Plan Continue with current plan of care     Progression Toward Goals   Progression toward goals Progressing toward goals          SLP Education - 10/10/16 0933    Education provided Yes   Education Details abdominal breathing and vocal effort   Person(s) Educated Patient   Methods Explanation;Demonstration;Handout;Verbal cues   Comprehension Verbalized understanding;Returned  demonstration          SLP Short Term Goals - 10/10/16 1139      SLP SHORT TERM GOAL #1   Title pt will demo appropriate breath support in 19/20 sentence responses over three sessions   Baseline 10-07-16   Time 3   Period Weeks   Status On-going     SLP SHORT TERM GOAL #2   Title pt will complete HEP for dysarthria with rare min A   Time 3   Period Weeks   Status On-going     SLP SHORT TERM GOAL #3   Title pt will respond to structured speech tasks with sentences in a noisy environment with 100% intelligibility over three sessions   Time 3   Period Weeks   Status On-going          SLP Long Term Goals - 10/10/16 1145      SLP LONG TERM GOAL #1   Title pt will demo swallow precautions from modified barium swallow (MBS) with POs PRN during session with 95% success   Time 7   Period Weeks   Status On-going     SLP LONG TERM GOAL #2   Title pt will produce conversational speech appropriate for 100% intelligibilty in 20 minutes conversation in noisy environment   Time 7   Period Weeks   Status On-going     SLP LONG TERM GOAL #3   Title pt will demo appropriate abdominal breath support in 20 minutes conversation   Time 7   Period Weeks   Status On-going          Plan - 10/10/16 1138    Clinical Impression Statement Pt able to carryover breath support and volume in structured tasks with SBA. Her volume in conversation is appropriate for shorter conversation and in longer conversation with increasing need for SLP cues. Continued skilled ST to carryover compensations for improved intellgibility and swallowing.    Speech Therapy Frequency 2x / week   Duration --  8 weeks   Treatment/Interventions Aspiration precaution training;Pharyngeal strengthening exercises;Diet toleration management by SLP;Cueing hierarchy;Internal/external aids;SLP instruction and feedback;Functional tasks;Patient/family education;Compensatory strategies;Oral motor exercises   Potential to  Achieve Goals Good   Consulted and Agree with Plan of Care Patient      Patient will benefit from skilled therapeutic intervention in order to improve the following deficits and impairments:   Dysarthria and anarthria  Dysphagia, unspecified type    Problem List Patient Active Problem List   Diagnosis Date Noted  . PD (Parkinson's disease) ([place]) 08/08/2016  . Loss of weight 07/25/2016  . 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 Elyse Hsu 10/08/2012  . CKD (chronic kidney disease) stage 3, GFR 30-59 ml/min 10/08/2012  . Hypertension 08/24/2012  . Hypothyroid, s/p radioactive iodine tx for graves 2001 - managed by endocrinologist, Dr. Elyse Hsu 08/24/2012  . Hyperlipemia  08/24/2012  . Vitamin D deficiency 08/24/2012   Deneise Lever, MS CF-SLP Speech-Language Pathologist  Aliene Altes 10/10/2016, 12:58 PM   [facility] [address] [place], [place], [address] Phone: [phone]   Fax:  [phone]   Name: MAYERLY NUNAMAKER MRN: [id number] Date of Birth: 1946/01/24

## 2016-10-10 NOTE — Patient Instructions (Signed)
Fall Prevention in the Home Falls can cause injuries and can affect people from all age groups. There are many simple things that you can do to make your home safe and to help prevent falls. What can I do on the outside of my home?  Regularly repair the edges of walkways and driveways and fix any cracks.  Remove high doorway thresholds.  Trim any shrubbery on the main path into your home.  Use bright outdoor lighting.  Clear walkways of debris and clutter, including tools and rocks.  Regularly check that handrails are securely fastened and in good repair. Both sides of any steps should have handrails.  Install guardrails along the edges of any raised decks or porches.  Have leaves, snow, and ice cleared regularly.  Use sand or salt on walkways during winter months.  In the garage, clean up any spills right away, including grease or oil spills. What can I do in the bathroom?  Use night lights.  Install grab bars by the toilet and in the tub and shower. Do not use towel bars as grab bars.  Use non-skid mats or decals on the floor of the tub or shower.  If you need to sit down while you are in the shower, use a plastic, non-slip stool.  Keep the floor dry. Immediately clean up any water that spills on the floor.  Remove soap buildup in the tub or shower on a regular basis.  Attach bath mats securely with double-sided non-slip rug tape.  Remove throw rugs and other tripping hazards from the floor. What can I do in the bedroom?  Use night lights.  Make sure that a bedside light is easy to reach.  Do not use oversized bedding that drapes onto the floor.  Have a firm chair that has side arms to use for getting dressed.  Remove throw rugs and other tripping hazards from the floor. What can I do in the kitchen?  Clean up any spills right away.  Avoid walking on wet floors.  Place frequently used items in easy-to-reach places.  If you need to reach for something above  you, use a sturdy step stool that has a grab bar.  Keep electrical cables out of the way.  Do not use floor polish or wax that makes floors slippery. If you have to use wax, make sure that it is non-skid floor wax.  Remove throw rugs and other tripping hazards from the floor. What can I do in the stairways?  Do not leave any items on the stairs.  Make sure that there are handrails on both sides of the stairs. Fix handrails that are broken or loose. Make sure that handrails are as long as the stairways.  Check any carpeting to make sure that it is firmly attached to the stairs. Fix any carpet that is loose or worn.  Avoid having throw rugs at the top or bottom of stairways, or secure the rugs with carpet tape to prevent them from moving.  Make sure that you have a light switch at the top of the stairs and the bottom of the stairs. If you do not have them, have them installed. What are some other fall prevention tips?  Wear closed-toe shoes that fit well and support your feet. Wear shoes that have rubber soles or low heels.  When you use a stepladder, make sure that it is completely opened and that the sides are firmly locked. Have someone hold the ladder while you are using   it. Do not climb a closed stepladder.  Add color or contrast paint or tape to grab bars and handrails in your home. Place contrasting color strips on the first and last steps.  Use mobility aids as needed, such as canes, walkers, scooters, and crutches.  Turn on lights if it is dark. Replace any light bulbs that burn out.  Set up furniture so that there are clear paths. Keep the furniture in the same spot.  Fix any uneven floor surfaces.  Choose a carpet design that does not hide the edge of steps of a stairway.  Be aware of any and all pets.  Review your medicines with your healthcare provider. Some medicines can cause dizziness or changes in blood pressure, which increase your risk of falling. Talk with  your health care provider about other ways that you can decrease your risk of falls. This may include working with a physical therapist or trainer to improve your strength, balance, and endurance. This information is not intended to replace advice given to you by your health care provider. Make sure you discuss any questions you have with your health care provider. Document Released: 09/09/2002 Document Revised: 02/16/2016 Document Reviewed: 10/24/2014 Elsevier Interactive Patient Education  2017 [employer]  

## 2016-10-10 NOTE — Therapy (Signed)
[facility] [address] [place], [place], [address] Phone: [phone]   Fax:  [phone]  Physical Therapy Treatment  Patient Details  Name: Pamela Patton MRN: [id number] Date of Birth: Sep 10, 1946 Referring Provider: [name], DO  Encounter Date: 10/10/2016      PT End of Session - 10/10/16 1024    Visit Number 13   Number of Visits 14   Date for PT Re-Evaluation 10/17/16   Authorization Type [employer] Medicare-GCODE every 10th visit   PT Start Time 0932   PT Stop Time 1014   PT Time Calculation (min) 42 min   Equipment Utilized During Treatment Gait belt   Activity Tolerance Patient tolerated treatment well   Behavior During Therapy [facility] for tasks assessed/performed      Past Medical History:  Diagnosis Date  . Anemia   . Arthritis   . Cancer [facility])    fibroid tumor  . Cataract    immature   . Chest pain summer 2012  . Chicken pox   . Diverticulosis    difficulty with colonoscopy 2005  . 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 Elyse Hsu 10/08/2012  . Fracture    radial/ulnar 03/2016  . GERD (gastroesophageal reflux disease)   . Heart murmur    innocent murmur  . High cholesterol   . Hypertension   . Hypothyroid, s/p radioactive iodine tx for graves 2001 08/24/2012  . Intestinal metaplasia of gastric mucosa   . Kidney disease   . Renal cancer ([place])   . UTI (urinary tract infection)   . Weight loss    eval with GI in 2016; s/p CT/MRI and PET scans    Past Surgical History:  Procedure Laterality Date  . ABDOMINAL HYSTERECTOMY  1989  . COLONOSCOPY    . DG BARIUM SWALLOW ([place] HX)     9 yrs ago per pt due to incomplete colonsocopy    There were no vitals filed for this visit.      Subjective Assessment - 10/10/16 0937    Subjective nothing new to report.  plans to graduate this week   Patient Stated Goals Pt's goal for therapy is to help with  strength and coordination.   Currently in Pain? No/denies                         [facility] Adult PT Treatment/Exercise - 10/10/16 1019      Ambulation/Gait   Stairs Yes   Stairs Assistance 6: Modified independent (Device/Increase time)   Stair Management Technique One rail Right;Alternating pattern   Number of Stairs 8     Self-Care   Self-Care Other Self-Care Comments   Other Self-Care Comments  educated on fall prevention strategies             Balance Exercises - 10/10/16 1021      Balance Exercises: Standing   Stepping Strategy Anterior;Posterior;Lateral  head turns horizontal/vertical x 10 each           PT Education - 10/10/16 1024    Education provided Yes   Education Details fall prevention   Person(s) Educated Patient   Methods Explanation;Handout   Comprehension Verbalized understanding          PT Short Term Goals - 09/01/16 0920      PT SHORT TERM GOAL #1   Title Pt will be independent with HEP for improved balance, posture, gait for improved functional  mobility.  TARGET 09/01/16   Status Achieved     PT SHORT TERM GOAL #2   Title Pt will perform at least 8 of 10 reps of sit<>stand transfers with minimal UE support with no posterior lean.   Status Achieved     PT SHORT TERM GOAL #3   Title Pt will improve single limb stance to at least 3 seconds bilateral lower extremities for improved step and obstacle negotiation.   Status Achieved           PT Long Term Goals - 10/10/16 1035      PT LONG TERM GOAL #1   Title Pt will verbalize understanding of fall prevention in home environment.  TARGET 10/01/16; UPDATED TARGET 10/14/16   Status Achieved     PT LONG TERM GOAL #2   Title Pt will improve TUG and TUG manual scores to less than 10% difference, for improved dual tasking with gait.   Status Achieved     PT LONG TERM GOAL #3   Title Pt will negotiate at least 4 steps with handrails, step through pattern, modified  independently for safe stair negotiation at home.  UPDATED TARGET 10/14/16   Status Achieved     PT LONG TERM GOAL #4   Title Pt will ambulate outdoor surfaces, at least 1000 ft with appropriate assistive device, modified independently for improved community gait.  UPDATED TARGET 10/14/16   Status On-going     PT LONG TERM GOAL #5   Title Pt will verbalize plans for continued community fitness upon D/C from PT.   Status Achieved               Plan - 10/10/16 1039    Clinical Impression Statement Educated on fall prevention strategies and pt verbalized understanding.   Anticipate d/c next visit.   PT Treatment/Interventions ADLs/Self Care Home Management;Functional mobility training;Gait training;DME Instruction;Therapeutic activities;Therapeutic exercise;Balance training;Neuromuscular re-education;Patient/family education;Stair training   PT Next Visit Plan finish checking LTGs, g code, plan for d/c.   Consulted and Agree with Plan of Care Patient      Patient will benefit from skilled therapeutic intervention in order to improve the following deficits and impairments:  Abnormal gait, Decreased balance, Decreased mobility, Decreased strength, Difficulty walking, Impaired tone, Postural dysfunction  Visit Diagnosis: Other abnormalities of gait and mobility  Abnormal posture  Other symptoms and signs involving the nervous system  Unsteadiness on feet     Problem List Patient Active Problem List   Diagnosis Date Noted  . PD (Parkinson's disease) ([place]) 08/08/2016  . Loss of weight 07/25/2016  . 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 Elyse Hsu 10/08/2012  . CKD (chronic kidney disease) stage 3, GFR 30-59 ml/min 10/08/2012  . Hypertension 08/24/2012  . Hypothyroid, s/p radioactive iodine tx for graves 2001 - managed by endocrinologist, Dr. Elyse Hsu 08/24/2012  . Hyperlipemia 08/24/2012  . Vitamin D deficiency 08/24/2012       Laureen Abrahams, PT, DPT 10/10/16 10:43 AM    [facility] [address] [place], [place], [address] Phone: [phone]   Fax:  [phone]  Name: Pamela Patton MRN: [id number] Date of Birth: November 15, 1945

## 2016-10-10 NOTE — Progress Notes (Signed)
Pamela Patton was seen today in the movement disorders clinic for neurologic consultation at the request of Lucretia Kern., DO.  The consultation is for the evaluation of gait change, cogwheel rigidity and to r/o PD.  The records that were made available to me were reviewed.  Pt reports that she has had unsteadiness at least since June.  She fell then and broke her radius/ulna.  States that she turned around at the sink and thinks that she tripped over her feet and "raggady bottom shoes"  10/11/17 update:  The patient follows up today.  I started her on carbidopa/levodopa 25/100 and she has worked up to one tablet 3 times daily.  "I think that it is helping."  When first started the whole tablets she had mild nausea for 5 minutes only but that is better.  Pt denies falls.  Pt denies lightheadedness, near syncope.  No hallucinations.  Mood has been "pretty good." The patient has been attending therapy at the outpatient rehabilitation center for physical, occupational and speech therapy. She will finish up OT/PT this week and has ST until second week of February.   I have reviewed notes.   A MBE was requested by speech therapy and this is scheduled for 10/13/2016.  No other CV exercise besides for therapy.     ALLERGIES:  No Known Allergies  CURRENT MEDICATIONS:  Outpatient Encounter Prescriptions as of 10/11/2016  Medication Sig  . AMLODIPINE BESYLATE PO Take 10 mg by mouth daily.   Marland Kitchen aspirin 81 MG tablet Take 81 mg by mouth daily.  . ATENOLOL PO Take 50 mg by mouth 2 (two) times daily.   Marland Kitchen atorvastatin (LIPITOR) 40 MG tablet Take 40 mg by mouth daily.  . Calcium Carbonate-Vitamin D (CALTRATE 600+D) 600-400 MG-UNIT per tablet Take 1 tablet by mouth daily.  . carbidopa-levodopa (SINEMET IR) 25-100 MG tablet Take 1 tablet by mouth 3 (three) times daily.  Marland Kitchen HUMALOG KWIKPEN 100 UNIT/ML KiwkPen   . hydrochlorothiazide (HYDRODIURIL) 25 MG tablet Take 25 mg by mouth daily.   . insulin glargine (LANTUS)  100 UNIT/ML injection Inject 25 Units into the skin at bedtime.   Marland Kitchen levothyroxine (SYNTHROID, LEVOTHROID) 75 MCG tablet Take 75 mcg by mouth daily.  Marland Kitchen METFORMIN HCL PO Take 500 mg by mouth 2 (two) times daily.   . Pioglitazone HCl (ACTOS PO) Take 45 mg by mouth daily.   . Valsartan (DIOVAN PO) Take 320 mg by mouth daily.    Facility-Administered Encounter Medications as of 10/11/2016  Medication  . 0.9 %  sodium chloride infusion    PAST MEDICAL HISTORY:   Past Medical History:  Diagnosis Date  . Anemia   . Arthritis   . Cancer [facility])    fibroid tumor  . Cataract    immature   . Chest pain summer 2012  . Chicken pox   . Diverticulosis    difficulty with colonoscopy 2005  . 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 Elyse Hsu 10/08/2012  . Fracture    radial/ulnar 03/2016  . GERD (gastroesophageal reflux disease)   . Heart murmur    innocent murmur  . High cholesterol   . Hypertension   . Hypothyroid, s/p radioactive iodine tx for graves 2001 08/24/2012  . Intestinal metaplasia of gastric mucosa   . Kidney disease   . Renal cancer ([place])   . UTI (urinary tract infection)   . Weight loss    eval with GI  in 2016; s/p CT/MRI and PET scans    PAST SURGICAL HISTORY:   Past Surgical History:  Procedure Laterality Date  . ABDOMINAL HYSTERECTOMY  1989  . COLONOSCOPY    . DG BARIUM SWALLOW ([place] HX)     9 yrs ago per pt due to incomplete colonsocopy    SOCIAL HISTORY:   Social History   Social History  . Marital status: Married    Spouse name: N/A  . Number of children: N/A  . Years of education: N/A   Occupational History  . retired     taught at [employer], histology, anatomy and physiology   Social History Main Topics  . Smoking status: Never Smoker  . Smokeless tobacco: Never Used  . Alcohol use No  . Drug use: No  . Sexual activity: Not on file   Other Topics Concern  . 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 Mother Deceased  . Father Deceased  . Maternal Grandmother Deceased  . Maternal Grandfather Deceased  . Paternal Grandmother Deceased  . Paternal Grandfather Deceased  . Brother Deceased  . Sister Alive  . Sister Deceased  . Sister Alive  . Neg Hx     ROS:  A complete 10 system review of systems was obtained and was unremarkable apart from what is mentioned above.  PHYSICAL EXAMINATION:    VITALS:   Vitals:   10/11/16 1057  BP: (!) 142/62  Pulse: 68  Weight: 145 lb (65.8 kg)  Height: 5' 5.5" (1.664 m)   Wt Readings from Last 3 Encounters:  10/11/16 145 lb (65.8 kg)  09/15/16 143 lb (64.9 kg)  08/08/16 142 lb (64.4 kg)   LAST PILL TAKEN AT 8:40 AM and seen at 11:00 AM.  GEN:  The patient appears stated age and is in NAD. HEENT:  Normocephalic, atraumatic.  The mucous membranes are moist. The superficial temporal arteries are without ropiness or tenderness. CV:  RRR Lungs:  CTAB Neck/HEME:  There are no carotid bruits bilaterally.   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  Neurological examination:  Orientation: The patient is alert and oriented x3.  Cranial nerves: There is good facial symmetry. There is facial hypomimia.  Pupils are equal round and reactive to light bilaterally. Fundoscopic exam reveals clear margins bilaterally. Extraocular muscles are intact. The visual fields are full to confrontational testing. The speech is fluent and clear. Speech is pseudobulbar in quality.  Soft palate rises symmetrically and there is no tongue deviation. Hearing is intact  to conversational tone. Sensation: Sensation is intact to light and pinprick throughout (facial, trunk, extremities). Vibration is intact at the bilateral big toe. There is no extinction with double simultaneous stimulation. There is no sensory dermatomal level identified. Motor: Strength is 5/5 in the bilateral upper and lower extremities with the exception of abduction of fingers on the left and strength there is 4/5.   Shoulder shrug is equal and symmetric.  There  is no pronator drift.   Movement examination: Tone: There is normal tone in the LUE and mild increased tone in the RUE.  Tone in the LE is good bilaterally. Abnormal movements: There is no tremor today Coordination:  There is still decremation with RAM's, with hand opening and closing, finger taps, alternation of supination/pronation of the forearm on the left.  There is decremation with finger taps on the right.  All other forms of rapid alternating movements on the right were normal.  All forms of rapid alternating movements in the lower extremities bilaterally were normal. Gait and Station: The patient has no significant difficulty arising out of a deep-seated chair without the use of the hands. The patient's stride length is normal with decreased arm swing bilaterally.   LABS    Chemistry      Component Value Date/Time   NA 145 06/03/2016 1444   K 4.0 06/03/2016 1444   CL 106 06/03/2016 1444   CO2 31 06/03/2016 1444   BUN 23 06/03/2016 1444   CREATININE 1.33 (H) 06/03/2016 1444      Component Value Date/Time   CALCIUM 9.8 06/03/2016 1444   ALKPHOS 58 06/03/2016 1444   AST 13 06/03/2016 1444   ALT 10 06/03/2016 1444   BILITOT 0.6 06/03/2016 1444     Lab Results  Component Value Date   TSH 0.58 04/02/2015   No results found for: VITAMINB12   ASSESSMENT/PLAN:  1.  Parkinsonism.  I suspect that this does represent idiopathic Parkinson's disease but I do think that an atypical state like PSP is in the ddx given  diplopia and pseudobulbar speech.  However, tremor is somewhat atypical in PSP.  I discussed with her that time will help [place] differentiate the 2.  In addition, I told her that treatments were the same, although I told her that prognosis was very different.  -continue carbidopa/levodopa 25/100 tid.  Currently taking at 8:45am/1-2pm/and 8pm but doesn't go to bed until midnight.  Looks better on the medication.  -finishing up PT/OT/ST  -We discussed community resources in the area including patient support groups and community exercise programs for PD and pt education was provided to the patient.   Wants to join [employer] biking class and see no objection to that.  I filled out the form but told her to confirm with Dr. Gwenlyn Found her cardiologist but he only sees her for HTN/Hyperlipid.  I reviewed his records.    2.  Dysphagia  -scheduled for MBE on 10/13/16  3. Much greater than 50% of this visit was spent in counseling and coordinating care.  Total face to face time:  25 min    Cc:  Colin [place] R., DO

## 2016-10-10 NOTE — Therapy (Signed)
[facility] [address] [place], [place], [address] Phone: [phone]   Fax:  [phone]  Physical Therapy Treatment  Patient Details  Name: Pamela Patton MRN: [id number] Date of Birth: 14-Oct-1945 Referring Provider: [name], DO  Encounter Date: 10/07/2016    Past Medical History:  Diagnosis Date  . Anemia   . Arthritis   . Cancer [facility])    fibroid tumor  . Cataract    immature   . Chest pain summer 2012  . Chicken pox   . Diverticulosis    difficulty with colonoscopy 2005  . 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 Elyse Hsu 10/08/2012  . Fracture    radial/ulnar 03/2016  . GERD (gastroesophageal reflux disease)   . Heart murmur    innocent murmur  . High cholesterol   . Hypertension   . Hypothyroid, s/p radioactive iodine tx for graves 2001 08/24/2012  . Intestinal metaplasia of gastric mucosa   . Kidney disease   . Renal cancer ([place])   . UTI (urinary tract infection)   . Weight loss    eval with GI in 2016; s/p CT/MRI and PET scans    Past Surgical History:  Procedure Laterality Date  . ABDOMINAL HYSTERECTOMY  1989  . COLONOSCOPY    . DG BARIUM SWALLOW ([place] HX)     9 yrs ago per pt due to incomplete colonsocopy    There were no vitals filed for this visit.                                 PT Short Term Goals - 09/01/16 0920      PT SHORT TERM GOAL #1   Title Pt will be independent with HEP for improved balance, posture, gait for improved functional mobility.  TARGET 09/01/16   Status Achieved     PT SHORT TERM GOAL #2   Title Pt will perform at least 8 of 10 reps of sit<>stand transfers with minimal UE support with no posterior lean.   Status Achieved     PT SHORT TERM GOAL #3   Title Pt will improve single limb stance to at least 3 seconds bilateral lower extremities for improved step and obstacle negotiation.    Status Achieved           PT Long Term Goals - 10/07/16 1345      PT LONG TERM GOAL #1   Title Pt will verbalize understanding of fall prevention in home environment.  TARGET 10/01/16; UPDATED TARGET 10/14/16   Time 1  per recert [id number]   Period Weeks   Status On-going     PT LONG TERM GOAL #2   Title Pt will improve TUG and TUG manual scores to less than 10% difference, for improved dual tasking with gait.   Time 6   Period Weeks   Status Achieved     PT LONG TERM GOAL #3   Title Pt will negotiate at least 4 steps with handrails, step through pattern, modified independently for safe stair negotiation at home.  UPDATED TARGET [id number]   Time 1  recert [id number]   Period Weeks   Status On-going     PT LONG TERM GOAL #4   Title Pt will ambulate outdoor surfaces, at least 1000 ft with appropriate assistive device, modified independently for improved community gait.  UPDATED TARGET 10/14/16   Time 1  per recert [id number]   Period Weeks   Status On-going     PT LONG TERM GOAL #5   Title Pt will verbalize plans for continued community fitness upon D/C from PT.   Time 6   Period Weeks   Status Achieved             Patient will benefit from skilled therapeutic intervention in order to improve the following deficits and impairments:  Abnormal gait, Decreased balance, Decreased mobility, Decreased strength, Difficulty walking, Impaired tone, Postural dysfunction  Visit Diagnosis: Other abnormalities of gait and mobility - Plan: PT plan of care cert/re-cert  Abnormal posture - Plan: PT plan of care cert/re-cert  Other symptoms and signs involving the nervous system - Plan: PT plan of care cert/re-cert  Unsteadiness on feet - Plan: PT plan of care cert/re-cert     Problem List Patient Active Problem List   Diagnosis Date Noted  . PD (Parkinson's disease) ([place]) 08/08/2016  . Loss of weight 07/25/2016  . DM (diabetes mellitus), type 2 with renal complications managed  by endocrinologist, Dr. Elyse Hsu 10/08/2012  . CKD (chronic kidney disease) stage 3, GFR 30-59 ml/min 10/08/2012  . Hypertension 08/24/2012  . Hypothyroid, s/p radioactive iodine tx for graves 2001 - managed by endocrinologist, Dr. Elyse Hsu 08/24/2012  . Hyperlipemia 08/24/2012  . Vitamin D deficiency 08/24/2012    Koleson Reifsteck W. 10/10/2016, 9:21 AM  [facility] [address] [place], [place], [address] Phone: [phone]   Fax:  [phone]  Name: Pamela Patton MRN: [id number] Date of Birth: 1946/03/26

## 2016-10-10 NOTE — Patient Instructions (Addendum)
Worksheets for home practice

## 2016-10-11 ENCOUNTER — Ambulatory Visit (INDEPENDENT_AMBULATORY_CARE_PROVIDER_SITE_OTHER): Payer: Medicare Other | Admitting: Neurology

## 2016-10-11 ENCOUNTER — Encounter: Payer: Self-pay | Admitting: Neurology

## 2016-10-11 VITALS — BP 142/62 | HR 68 | Ht 65.5 in | Wt 145.0 lb

## 2016-10-11 DIAGNOSIS — R1319 Other dysphagia: Secondary | ICD-10-CM | POA: Diagnosis not present

## 2016-10-11 DIAGNOSIS — G20 Parkinson's disease: Secondary | ICD-10-CM

## 2016-10-11 MED ORDER — CARBIDOPA-LEVODOPA 25-100 MG PO TABS: 1.0000 | ORAL_TABLET | Freq: Three times a day (TID) | ORAL | 5 refills | Status: DC

## 2016-10-11 NOTE — Addendum Note (Signed)
Addended byAnnamaria Helling on: 10/11/2016 11:24 AM   Modules accepted: Orders

## 2016-10-12 ENCOUNTER — Ambulatory Visit: Payer: Medicare Other

## 2016-10-12 ENCOUNTER — Ambulatory Visit: Payer: Medicare Other | Admitting: Occupational Therapy

## 2016-10-12 ENCOUNTER — Ambulatory Visit: Payer: Medicare Other | Admitting: Physical Therapy

## 2016-10-12 DIAGNOSIS — R29898 Other symptoms and signs involving the musculoskeletal system: Secondary | ICD-10-CM

## 2016-10-12 DIAGNOSIS — R2681 Unsteadiness on feet: Secondary | ICD-10-CM

## 2016-10-12 DIAGNOSIS — R2689 Other abnormalities of gait and mobility: Secondary | ICD-10-CM

## 2016-10-12 DIAGNOSIS — R293 Abnormal posture: Secondary | ICD-10-CM

## 2016-10-12 DIAGNOSIS — R471 Dysarthria and anarthria: Secondary | ICD-10-CM

## 2016-10-12 DIAGNOSIS — R29818 Other symptoms and signs involving the nervous system: Secondary | ICD-10-CM

## 2016-10-12 DIAGNOSIS — R278 Other lack of coordination: Secondary | ICD-10-CM

## 2016-10-12 DIAGNOSIS — R1310 Dysphagia, unspecified: Secondary | ICD-10-CM

## 2016-10-12 NOTE — Therapy (Signed)
[facility] [address] [place], [place], [address] Phone: [phone]   Fax:  [phone]  Occupational Therapy Treatment  Patient Details  Name: Pamela Patton MRN: [id number] Date of Birth: [birth year] Referring Provider: Dr Carles Collet  Encounter Date: 10/12/2016      OT End of Session - 10/12/16 1022    Visit Number 13   Number of Visits 17   Date for OT Re-Evaluation 10/15/16   Authorization Type UHC MCR   Authorization - Visit Number 13   Authorization - Number of Visits 20   OT Start Time 1017  d/c visit, 2 units only   OT Stop Time 1045   OT Time Calculation (min) 28 min   Activity Tolerance Patient tolerated treatment well   Behavior During Therapy [facility] for tasks assessed/performed      Past Medical History:  Diagnosis Date  . Anemia   . Arthritis   . Cancer [facility])    fibroid tumor  . Cataract    immature   . Chest pain summer 2012  . Chicken pox   . Diverticulosis    difficulty with colonoscopy 2005  . 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 Elyse Hsu 10/08/2012  . Fracture    radial/ulnar 03/2016  . GERD (gastroesophageal reflux disease)   . Heart murmur    innocent murmur  . High cholesterol   . Hypertension   . Hypothyroid, s/p radioactive iodine tx for graves 2001 08/24/2012  . Intestinal metaplasia of gastric mucosa   . Kidney disease   . Renal cancer ([place])   . UTI (urinary tract infection)   . Weight loss    eval with GI in 2016; s/p CT/MRI and PET scans    Past Surgical History:  Procedure Laterality Date  . ABDOMINAL HYSTERECTOMY  1989  . COLONOSCOPY    . DG BARIUM SWALLOW ([place] HX)     9 yrs ago per pt due to incomplete colonsocopy    There were no vitals filed for this visit.      Subjective Assessment - 10/12/16 1021    Subjective  Pt agrees with plans for d/c   Patient Stated Goals to maintain independence   Currently in  Pain? No/denies             Treatment: Therapist checked progress towards goals and discussed progress with pt. Discussed importance of community exercise and pt reports she is considering PD cycle class and PWR! exercise class. Arm bike x 5 mins level 1 for conditioning, pt maintained 28-30 rpm.                   OT Short Term Goals - 10/05/16 0856      OT SHORT TERM GOAL #1   Title I with PD specific HEP.   Time 4   Period Weeks   Status Achieved     OT SHORT TERM GOAL #2   Title Pt will verbalize understanding of adapted strategies for ADLs/ IADLs.   Time 4   Period Weeks   Status Achieved     OT SHORT TERM GOAL #3   Title Pt will improve LUE fine motor coordiantion as evidenced by performing 9 hole peg test in 65 secs or less.   Baseline LUE 80.81 secs, RUE 48.32 secs   Time 4   Period Weeks   Status Achieved  46.31 secs     OT SHORT TERM GOAL #4  Title Pt will demonstrate improved ease with dressing as evidenced by performing PPT#4 (donning/ doffing jacket ) in 60 secs or less.   Time 4   Period Weeks   Status Achieved  43.47 secs     OT SHORT TERM GOAL #5   Title Pt will demonstrate improved RUE fine motor coordination as evidenced by decreasing 9 hole peg test score to 43 secs or less.   Time 4   Period Weeks   Status Achieved  35.44 secs     OT SHORT TERM GOAL #6   Title assess handwriting and set goal PRN.   Time 4   Period Weeks   Status Deferred  good size and legibility, pt provided with hand writing suggestions in case she nees tham in the future           OT Long Term Goals - 10/12/16 1032      OT LONG TERM GOAL #1   Title Pt will verbalize understanding of ways to prevent future PD related complications and verbalize understanding of community resources.   Time 8   Period Weeks   Status Achieved     OT LONG TERM GOAL #2   Title Pt will demonstrate ability to retrieve a lightweight object at 120 shoulder flexion with  -20 elbow ext for LUE.   Baseline 115, -28   Time 8   Period Weeks   Status Achieved     OT LONG TERM GOAL #3   Title Pt will demosntrate ability to retrieve an item at 130 shoulder flexion and -20 elbow ext for RUE.   Baseline 130, -30   Time 8   Period Weeks   Status Achieved     OT LONG TERM GOAL #4   Title Pt will decrease 3 button / unbutton test to 90 secs or less.   Baseline 2 mins, 4 secs   Time 8   Period Weeks   Status Achieved  73.43 secs     OT LONG TERM GOAL #5   Title Pt will decrease PPT#2 (simulated feeding) to 16 secs or less.   Time 8   Period Weeks   Status Not Met  19.32 secs, 16.31 secs     OT LONG TERM GOAL #6   Title Pt will verbalize understanding of ways to keep thinking skills sharp   Time 8   Period Weeks   Status Achieved               Plan - 10/12/16 1024    Clinical Impression Statement Pt demonstrates good overall progress. She agrees with plans for d/c /   Rehab Potential Good   OT Frequency 2x / week   OT Duration 8 weeks   OT Treatment/Interventions Self-care/ADL training;Therapeutic exercise;Cognitive remediation/compensation;Visual/perceptual remediation/compensation;Splinting;Neuromuscular education;Moist Heat;Parrafin;Fluidtherapy;Energy conservation;Therapeutic exercises;Patient/family education;Balance training;Therapeutic activities;Passive range of motion;Manual Therapy;DME and/or AE instruction;Contrast Bath;Ultrasound;Cryotherapy   Plan discharge OT, pt can benefit from OT eval in 6 mons   OT Home Exercise Plan issued PWR! seated, basic coordiantion HEP., modified quadraped, PWR! hands, supine   Consulted and Agree with Plan of Care Patient      Patient will benefit from skilled therapeutic intervention in order to improve the following deficits and impairments:  Abnormal gait, Decreased cognition, Impaired vision/preception, Impaired flexibility, Decreased knowledge of use of DME, Decreased coordination, Decreased  mobility, Impaired tone, Decreased strength, Decreased range of motion, Decreased endurance, Decreased activity tolerance, Decreased balance, Decreased knowledge of precautions, Difficulty walking, Impaired perceived functional ability, Impaired  UE functional use  Visit Diagnosis: Other abnormalities of gait and mobility  Abnormal posture  Unsteadiness on feet  Other symptoms and signs involving the nervous system  Other symptoms and signs involving the musculoskeletal system  Other lack of coordination      G-Codes - Oct 22, 2016 1141    Functional Assessment Tool Used 9 hole peg test RUE 35.44 secs, LUE 45.81 secs, PPT# 4:23.41 secs, 3 button/ unbutton:73.43 secs   Functional Limitation Self care   Self Care Goal Status ([id number]) At least 20 percent but less than 40 percent impaired, limited or restricted   Self Care Discharge Status [phone]) At least 20 percent but less than 40 percent impaired, limited or restricted     OCCUPATIONAL THERAPY DISCHARGE SUMMARY   Current functional level related to goals / functional outcomes: Pt made excellent progress overall, see above.   Remaining deficits: Bradykinesia, rigidity, decreased balance, decreased coordination, decreased functional mobility, decreased strength, decreased ROM   Education / Equipment: Pt was educated regarding PD specific HEP, ways to prevent future complications, adapted strategies for ADLs and community resources. Pt verbalized understanding.  Plan: Patient agrees to discharge.  Patient goals were partially met. Patient is being discharged due to being pleased with the current functional level.  ?????     Problem List Patient Active Problem List   Diagnosis Date Noted  . PD (Parkinson's disease) ([place]) 08/08/2016  . Loss of weight 07/25/2016  . 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 Elyse Hsu 10/08/2012  . CKD (chronic kidney disease) stage 3, GFR 30-59 ml/min  10/08/2012  . Hypertension 08/24/2012  . Hypothyroid, s/p radioactive iodine tx for graves 2001 - managed by endocrinologist, Dr. Elyse Hsu 08/24/2012  . Hyperlipemia 08/24/2012  . Vitamin D deficiency 08/24/2012    Nneka Blanda Oct 22, 2016, 1:26 PM Theone Murdoch, OTR/L [phone] Phone: [phone] 1:29 PM 10/22/2016 [facility] [address] [place], [place], [address] Phone: [phone]   Fax:  [phone]  Name: LIANE TRIBBEY MRN: [id number] Date of Birth: [birth year]

## 2016-10-12 NOTE — Therapy (Signed)
[facility] [address] [place], [place], [address] Phone: [phone]   Fax:  [phone]  Speech Language Pathology Treatment  Patient Details  Name: Pamela Patton MRN: [id number] Date of Birth: August 12, 1946 Referring Provider: [name], DO  Encounter Date: 10/12/2016      End of Session - 10/12/16 0931    Visit Number 5   Number of Visits 17   Date for SLP Re-Evaluation 12/09/16   SLP Start Time 0850   SLP Stop Time  0931   SLP Time Calculation (min) 41 min   Activity Tolerance Patient tolerated treatment well      Past Medical History:  Diagnosis Date  . Anemia   . Arthritis   . Cancer [facility])    fibroid tumor  . Cataract    immature   . Chest pain summer 2012  . Chicken pox   . Diverticulosis    difficulty with colonoscopy 2005  . 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 Elyse Hsu 10/08/2012  . Fracture    radial/ulnar 03/2016  . GERD (gastroesophageal reflux disease)   . Heart murmur    innocent murmur  . High cholesterol   . Hypertension   . Hypothyroid, s/p radioactive iodine tx for graves 2001 08/24/2012  . Intestinal metaplasia of gastric mucosa   . Kidney disease   . Renal cancer ([place])   . UTI (urinary tract infection)   . Weight loss    eval with GI in 2016; s/p CT/MRI and PET scans    Past Surgical History:  Procedure Laterality Date  . ABDOMINAL HYSTERECTOMY  1989  . COLONOSCOPY    . DG BARIUM SWALLOW ([place] [facility]X)     9 yrs ago per pt due to incomplete colonsocopy    There were no vitals filed for this visit.      Subjective Assessment - 10/12/16 0855    Subjective Follow up with Dr. Carles Collet yesterday. Nothing new, per pt.   Currently in Pain? No/denies               ADULT SLP TREATMENT - 10/12/16 0900      General Information   Behavior/Cognition Alert;Cooperative;Pleasant mood     Treatment Provided   Treatment  provided Cognitive-Linquistic     Cognitive-Linquistic Treatment   Treatment focused on Dysarthria   Skilled Treatment SLP facilitated pt's WNL conversational volume and improved speech intelligibility by having her read for approx 90 seconds. Pt demonstrated excellent use of abdominal breathing (AB). SLP then engaged pt in mod complex conversation for close to 10 minutes and pt demonstrated excellent use of AB as well as stronger and intelligible speech with average 70dB. In "non-therapy" (non-conversational task) conversational tasks, pt maintained AB 50% of the time and WNL volume voice at average 70dB 80% of the time with rare min A from SLP to cont to use stronger speech.      Assessment / Recommendations / Plan   Plan Continue with current plan of care     Progression Toward Goals   Progression toward goals Progressing toward goals            SLP Short Term Goals - 10/12/16 0931      SLP SHORT TERM GOAL #1   Title pt will demo appropriate breath support in 19/20 sentence responses over three sessions   Baseline 10-07-16, 10-12-16   Time 3   Period Weeks   Status On-going  SLP SHORT TERM GOAL #2   Title pt will complete HEP for dysarthria with rare min A   Time 3   Period Weeks   Status On-going     SLP SHORT TERM GOAL #3   Title pt will respond to structured speech tasks with sentences in a noisy environment with 100% intelligibility over three sessions   Time 3   Period Weeks   Status On-going          SLP Long Term Goals - 10/12/16 0932      SLP LONG TERM GOAL #1   Title pt will demo swallow precautions from modified barium swallow (MBS) with POs PRN during session with 95% success   Time 7   Period Weeks   Status On-going     SLP LONG TERM GOAL #2   Title pt will produce conversational speech appropriate for 100% intelligibilty in 20 minutes conversation in noisy environment   Time 7   Period Weeks   Status On-going     SLP LONG TERM GOAL #3   Title  pt will demo appropriate abdominal breath support in 20 minutes conversation   Time 7   Period Weeks   Status On-going          Plan - 10/12/16 0931    Clinical Impression Statement Pt able to carryover breath support and volume in structured tasks with SBA. Her volume in conversation is appropriate for shorter conversation and in longer conversation with increasing need for SLP cues. Continued skilled ST to carryover compensations for improved intellgibility and swallowing.    Speech Therapy Frequency 2x / week   Duration --  8 weeks   Treatment/Interventions Aspiration precaution training;Pharyngeal strengthening exercises;Diet toleration management by SLP;Cueing hierarchy;Internal/external aids;SLP instruction and feedback;Functional tasks;Patient/family education;Compensatory strategies;Oral motor exercises   Potential to Achieve Goals Good   Consulted and Agree with Plan of Care Patient      Patient will benefit from skilled therapeutic intervention in order to improve the following deficits and impairments:   Dysarthria and anarthria  Dysphagia, unspecified type    Problem List Patient Active Problem List   Diagnosis Date Noted  . PD (Parkinson's disease) ([place]) 08/08/2016  . Loss of weight 07/25/2016  . 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 Elyse Hsu 10/08/2012  . CKD (chronic kidney disease) stage 3, GFR 30-59 ml/min 10/08/2012  . Hypertension 08/24/2012  . Hypothyroid, s/p radioactive iodine tx for graves 2001 - managed by endocrinologist, Dr. Elyse Hsu 08/24/2012  . Hyperlipemia 08/24/2012  . Vitamin D deficiency 08/24/2012    [facility] ,MS, CCC-SLP  10/12/2016, 9:35 AM  [facility] [address] [place], [place], [address] Phone: [phone]   Fax:  [phone]   Name: Pamela Patton MRN: [id number] Date of Birth: Oct 19, 1945

## 2016-10-12 NOTE — Patient Instructions (Signed)
Make a phone call each day focusing on abdominal breathing and stronger speech.

## 2016-10-12 NOTE — Therapy (Signed)
[facility] [address] [place], [place], [address] Phone: [phone]   Fax:  [phone]  Physical Therapy Treatment/Discharge  Patient Details  Name: Pamela Patton MRN: [id number] Date of Birth: Feb 12, 1946 Referring Provider: [name], DO  Encounter Date: 10/12/2016      PT End of Session - 10/12/16 0951    Visit Number 14   Authorization Type UHC Medicare-GCODE every 10th visit   PT Start Time 0932  d/c visit   PT Stop Time 1002   PT Time Calculation (min) 30 min   Activity Tolerance Patient tolerated treatment well   Behavior During Therapy [facility] for tasks assessed/performed      Past Medical History:  Diagnosis Date  . Anemia   . Arthritis   . Cancer [facility])    fibroid tumor  . Cataract    immature   . Chest pain summer 2012  . Chicken pox   . Diverticulosis    difficulty with colonoscopy 2005  . 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 Elyse Hsu 10/08/2012  . Fracture    radial/ulnar 03/2016  . GERD (gastroesophageal reflux disease)   . Heart murmur    innocent murmur  . High cholesterol   . Hypertension   . Hypothyroid, s/p radioactive iodine tx for graves 2001 08/24/2012  . Intestinal metaplasia of gastric mucosa   . Kidney disease   . Renal cancer ([place])   . UTI (urinary tract infection)   . Weight loss    eval with GI in 2016; s/p CT/MRI and PET scans    Past Surgical History:  Procedure Laterality Date  . ABDOMINAL HYSTERECTOMY  1989  . COLONOSCOPY    . DG BARIUM SWALLOW ([place] HX)     9 yrs ago per pt due to incomplete colonsocopy    There were no vitals filed for this visit.      Subjective Assessment - 10/12/16 0933    Subjective ready for d/c today.  [facility]  Dr. Carles Collet pleased with progress.   Patient Stated Goals Pt's goal for therapy is to help with strength and coordination.   Currently in Pain?  No/denies            [facility] PT Assessment - 10/12/16 0948      Timed Up and Go Test   Normal TUG (seconds) 11.69   Manual TUG (seconds) 10.03                     [facility] Adult PT Treatment/Exercise - 10/12/16 0948      Ambulation/Gait   Ambulation/Gait Yes   Ambulation/Gait Assistance 7: Independent   Ambulation Distance (Feet) 1100 Feet   Assistive device None   Ambulation Surface Level;Unlevel;Indoor;Outdoor;Paved;Grass;Other (comment)  mulch     Knee/Hip Exercises: Aerobic   Nustep SciFit, Level 3 x 8 minutes, 4 extemities, with cues for RPM >60 .  Cues for increased intensity of movement patterns and exercise                  PT Short Term Goals - 09/01/16 0920      PT SHORT TERM GOAL #1   Title Pt will be independent with HEP for improved balance, posture, gait for improved functional mobility.  TARGET 09/01/16   Status Achieved     PT SHORT TERM GOAL #2   Title Pt will perform at least 8 of 10 reps of  sit<>stand transfers with minimal UE support with no posterior lean.   Status Achieved     PT SHORT TERM GOAL #3   Title Pt will improve single limb stance to at least 3 seconds bilateral lower extremities for improved step and obstacle negotiation.   Status Achieved           PT Long Term Goals - Nov 06, 2016 0951      PT LONG TERM GOAL #1   Title Pt will verbalize understanding of fall prevention in home environment.  TARGET 10/01/16; UPDATED TARGET 10/14/16   Status Achieved     PT LONG TERM GOAL #2   Title Pt will improve TUG and TUG manual scores to less than 10% difference, for improved dual tasking with gait.   Status Achieved     PT LONG TERM GOAL #3   Title Pt will negotiate at least 4 steps with handrails, step through pattern, modified independently for safe stair negotiation at home.  UPDATED TARGET 10/14/16   Status Achieved     PT LONG TERM GOAL #4   Title Pt will ambulate outdoor surfaces, at least 1000 ft with appropriate  assistive device, modified independently for improved community gait.  UPDATED TARGET 10/14/16   Status Achieved     PT LONG TERM GOAL #5   Title Pt will verbalize plans for continued community fitness upon D/C from PT.   Status Achieved               Plan - November 06, 2016 6144    Clinical Impression Statement Pt has met all goals and is ready for d/c.  Recommend continued community exercise and HEP.   PT Treatment/Interventions ADLs/Self Care Home Management;Functional mobility training;Gait training;DME Instruction;Therapeutic activities;Therapeutic exercise;Balance training;Neuromuscular re-education;Patient/family education;Stair training   PT Next Visit Plan d/c PT today      Patient will benefit from skilled therapeutic intervention in order to improve the following deficits and impairments:  Abnormal gait, Decreased balance, Decreased mobility, Decreased strength, Difficulty walking, Impaired tone, Postural dysfunction  Visit Diagnosis: Other abnormalities of gait and mobility  Abnormal posture  Unsteadiness on feet       G-Codes - 11/06/2016 1003    Functional Assessment Tool Used SLS RLE >20 sec, TUG 11.69 sec, TUG manual 10 sec; no falls since PT   Functional Limitation Mobility: Walking and moving around   Mobility: Walking and Moving Around Goal Status [phone]) At least 1 percent but less than 20 percent impaired, limited or restricted   Mobility: Walking and Moving Around Discharge Status [phone]) At least 1 percent but less than 20 percent impaired, limited or restricted      Problem List Patient Active Problem List   Diagnosis Date Noted  . PD (Parkinson's disease) ([place]) 08/08/2016  . Loss of weight 07/25/2016  . 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 Elyse Hsu 10/08/2012  . CKD (chronic kidney disease) stage 3, GFR 30-59 ml/min 10/08/2012  . Hypertension 08/24/2012  . Hypothyroid, s/p radioactive iodine tx for  graves 2001 - managed by endocrinologist, Dr. Elyse Hsu 08/24/2012  . Hyperlipemia 08/24/2012  . Vitamin D deficiency 08/24/2012       Laureen Abrahams, PT, DPT 2016/11/06 10:04 AM    [facility] [address] [place], [place], [address] Phone: [phone]   Fax:  [phone]  Name: Pamela Patton MRN: [id number] Date of Birth: 1946/01/17         PHYSICAL THERAPY DISCHARGE SUMMARY  Visits from Start of Care: 14  Current functional level related to goals / functional outcomes: See above; all goals met   Remaining deficits: Associated symptoms consistent with Parkinson's; pt overall doing very well with plans to follow up with PT in ~6 months   Education / Equipment: HEP, community fitness, fall prevention  Plan: Patient agrees to discharge.  Patient goals were met. Patient is being discharged due to meeting the stated rehab goals.  ?????    Laureen Abrahams, PT, DPT 10/12/16 10:06 AM  [facility] [address] [place], [place] [address]  [phone] (office) [phone] (fax)

## 2016-10-13 ENCOUNTER — Ambulatory Visit (HOSPITAL_COMMUNITY)
Admission: RE | Admit: 2016-10-13 | Discharge: 2016-10-13 | Disposition: A | Discharge status: Home / Self Care | Payer: Medicare Other | Source: Ambulatory Visit | Attending: Neurology | Admitting: Neurology

## 2016-10-13 DIAGNOSIS — Z85528 Personal history of other malignant neoplasm of kidney: Secondary | ICD-10-CM | POA: Insufficient documentation

## 2016-10-13 DIAGNOSIS — R634 Abnormal weight loss: Secondary | ICD-10-CM | POA: Diagnosis not present

## 2016-10-13 DIAGNOSIS — N289 Disorder of kidney and ureter, unspecified: Secondary | ICD-10-CM | POA: Diagnosis not present

## 2016-10-13 DIAGNOSIS — E039 Hypothyroidism, unspecified: Secondary | ICD-10-CM | POA: Diagnosis not present

## 2016-10-13 DIAGNOSIS — G20 Parkinson's disease: Secondary | ICD-10-CM | POA: Insufficient documentation

## 2016-10-13 DIAGNOSIS — I10 Essential (primary) hypertension: Secondary | ICD-10-CM | POA: Diagnosis not present

## 2016-10-13 DIAGNOSIS — R1312 Dysphagia, oropharyngeal phase: Secondary | ICD-10-CM | POA: Diagnosis not present

## 2016-10-13 DIAGNOSIS — K219 Gastro-esophageal reflux disease without esophagitis: Secondary | ICD-10-CM | POA: Insufficient documentation

## 2016-10-13 DIAGNOSIS — R1319 Other dysphagia: Secondary | ICD-10-CM | POA: Diagnosis present

## 2016-10-13 DIAGNOSIS — E7800 Pure hypercholesterolemia, unspecified: Secondary | ICD-10-CM | POA: Insufficient documentation

## 2016-10-18 ENCOUNTER — Ambulatory Visit: Payer: Medicare Other

## 2016-10-18 DIAGNOSIS — R471 Dysarthria and anarthria: Secondary | ICD-10-CM

## 2016-10-18 DIAGNOSIS — R1310 Dysphagia, unspecified: Secondary | ICD-10-CM

## 2016-10-18 DIAGNOSIS — R2689 Other abnormalities of gait and mobility: Secondary | ICD-10-CM | POA: Diagnosis not present

## 2016-10-18 NOTE — Therapy (Signed)
[name] [address] [place], [place], [address] Phone: [phone]   Fax:  [phone]  Speech Language Pathology Treatment  Patient Details  Name: Pamela Patton MRN: [id number] Date of Birth: 02/17/1946 Referring Provider: [name], DO  Encounter Date: 10/18/2016      End of Session - 10/18/16 1509    Visit Number 6   Number of Visits 17   Date for SLP Re-Evaluation 12/09/16   SLP Start Time 0850   SLP Stop Time  0930   SLP Time Calculation (min) 40 min   Activity Tolerance Patient tolerated treatment well      Past Medical History:  Diagnosis Date  . Anemia   . Arthritis   . Cancer [facility])    fibroid tumor  . Cataract    immature   . Chest pain summer 2012  . Chicken pox   . Diverticulosis    difficulty with colonoscopy 2005  . 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 Elyse Hsu 10/08/2012  . Fracture    radial/ulnar 03/2016  . GERD (gastroesophageal reflux disease)   . Heart murmur    innocent murmur  . High cholesterol   . Hypertension   . Hypothyroid, s/p radioactive iodine tx for graves 2001 08/24/2012  . Intestinal metaplasia of gastric mucosa   . Kidney disease   . Renal cancer ([place])   . UTI (urinary tract infection)   . Weight loss    eval with GI in 2016; s/p CT/MRI and PET scans    Past Surgical History:  Procedure Laterality Date  . ABDOMINAL HYSTERECTOMY  1989  . COLONOSCOPY    . DG BARIUM SWALLOW ([place] HX)     9 yrs ago per pt due to incomplete colonsocopy    There were no vitals filed for this visit.      Subjective Assessment - 10/18/16 0901    Subjective Pt with modified barium swallow 10-13-16. Regular /thin recommended with small single sips, and pills with puree.   Currently in Pain? No/denies               ADULT SLP TREATMENT - 10/18/16 0908      General Information   Behavior/Cognition  Alert;Cooperative;Pleasant mood     Treatment Provided   Treatment provided Cognitive-Linquistic;Dysphagia     Dysphagia Treatment   Other treatment/comments As stated below, SLP reviewed pt's modified (MBSS) with her. See below for more details.     Cognitive-Linquistic Treatment   Treatment focused on Dysarthria   Skilled Treatment Discussed/reviewed pt's results of modified barium swallow test. SLP ensured pt understood re: single small sips. SLP used skilled observation to track/assess pt's volume in conversation over varying linguistic comlexities. Pt maintained average volume in the upper 60s dB, mostly, with some splintering into the low 70s dB.     Assessment / Recommendations / Plan   Plan Continue with current plan of care     Progression Toward Goals   Progression toward goals Progressing toward goals          SLP Education - 10/18/16 1509    Education provided Yes   Education Details results and recommendations from [employer]) Educated Patient   Methods Explanation   Comprehension Verbalized understanding          SLP Short Term Goals - 10/18/16 1511      SLP SHORT TERM GOAL #1   Title pt will demo appropriate breath  support in 19/20 sentence responses over three sessions   Status Achieved     SLP SHORT TERM GOAL #2   Title pt will complete HEP for dysarthria with rare min A   Time 2   Period Weeks   Status On-going     SLP SHORT TERM GOAL #3   Title pt will respond to structured speech tasks with sentences in a noisy environment with 100% intelligibility over three sessions   Baseline 10-18-16   Time 2   Period Weeks   Status On-going          SLP Long Term Goals - 10/18/16 1511      SLP LONG TERM GOAL #1   Title pt will demo swallow precautions from modified barium swallow (MBS) with POs PRN during session with 95% success   Time 6   Period Weeks   Status On-going     SLP LONG TERM GOAL #2   Title pt will produce conversational  speech appropriate for 100% intelligibilty in 20 minutes conversation in noisy environment   Time 6   Period Weeks   Status On-going     SLP LONG TERM GOAL #3   Title pt will demo appropriate abdominal breath support in 20 minutes conversation   Time 6   Period Weeks   Status On-going          Plan - 10/18/16 1510    Clinical Impression Statement SLP reviewed MBSS with pt and the recommendations. Pt's volume in conversation is was nearly WNL average. Continued skilled ST to carryover compensations for improved intellgibility and swallowing.    Speech Therapy Frequency 2x / week   Duration --  8 weeks   Treatment/Interventions Aspiration precaution training;Pharyngeal strengthening exercises;Diet toleration management by SLP;Cueing hierarchy;Internal/external aids;SLP instruction and feedback;Functional tasks;Patient/family education;Compensatory strategies;Oral motor exercises   Potential to Achieve Goals Good   Consulted and Agree with Plan of Care Patient      Patient will benefit from skilled therapeutic intervention in order to improve the following deficits and impairments:   Dysphagia, unspecified type  Dysarthria and anarthria    Problem List Patient Active Problem List   Diagnosis Date Noted  . PD (Parkinson's disease) ([place]) 08/08/2016  . Loss of weight 07/25/2016  . 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 Elyse Hsu 10/08/2012  . CKD (chronic kidney disease) stage 3, GFR 30-59 ml/min 10/08/2012  . Hypertension 08/24/2012  . Hypothyroid, s/p radioactive iodine tx for graves 2001 - managed by endocrinologist, Dr. Elyse Hsu 08/24/2012  . Hyperlipemia 08/24/2012  . Vitamin D deficiency 08/24/2012    [facility] ,MS, CCC-SLP  10/18/2016, 3:12 PM  [facility] [address] [place], [place], [address] Phone: [phone]   Fax:  [phone]   Name: Pamela Patton MRN: [id number] Date of Birth: 08/04/1946

## 2016-10-18 NOTE — Patient Instructions (Addendum)
Make sure to follow recommendations from study last week about your swallowing.  Small sips and bites, single sips, slowed rate of eating, stay upright after eating.

## 2016-10-20 ENCOUNTER — Encounter: Payer: Medicare Other | Admitting: Speech Pathology

## 2016-10-23 ENCOUNTER — Encounter: Payer: Self-pay | Admitting: Family Medicine

## 2016-10-23 NOTE — Progress Notes (Signed)
HPI:  Pamela Patton is a pleasant 71 yo with a PMH significant for HTN, DM and Hypothyroid disease (sees endo, Dr. Elyse Hsu, appreciate care), Parkinson's disease (sees Dr. Carles Collet, appreciate care), CKD, Hyperlipidemia and Vit D deficiency here for follow up. Had some trouble swallowing recently, evaluated by Dr. Carles Collet and getting therapy for this. Reports swallowing trouble with only mild issues and is doing better. However, she feels the worst issue is with swallowing her valsartan and wishes we could change this medication as has not trouble with her other pills. Admits she feels this may be psychologically. Had labs with Dr. Elyse Hsu less then one week ago and reports hgba1c, cbc, bmp and tsh all drawn.  ROS: See pertinent positives and negatives per HPI.  Past Medical History:  Diagnosis Date  . Anemia   . Arthritis   . Cataract    immature   . Chest pain summer 2012  . Chicken pox   . Diverticulosis    difficulty with colonoscopy 2005  . 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 Elyse Hsu 10/08/2012  . Fibroid    fibroid tumor  . Fracture    radial/ulnar 03/2016  . GERD (gastroesophageal reflux disease)   . Heart murmur    innocent murmur  . High cholesterol   . Hypertension   . Hypothyroid, s/p radioactive iodine tx for graves 2001 08/24/2012  . Intestinal metaplasia of gastric mucosa   . Kidney disease   . PD (Parkinson's disease) ([place]) 08/08/2016  . Renal cancer ([place])   . UTI (urinary tract infection)   . Weight loss    eval with GI in 2016; s/p CT/MRI and PET scans    Past Surgical History:  Procedure Laterality Date  . ABDOMINAL HYSTERECTOMY  1989  . COLONOSCOPY    . DG BARIUM SWALLOW ([place] HX)     9 yrs ago per pt due to incomplete colonsocopy    Family History  Problem Relation Age of Onset  . Cancer Mother     liver  . Liver cancer Mother   . Stroke Father 41  . Diabetes Father   . Hypertension Father   .  Diabetes Maternal Grandmother   . Prostate cancer Maternal Grandfather   . Pancreatic cancer Brother   . Diabetes Sister   . Cancer Sister   . Diabetes Sister   . Colon polyps Neg Hx   . Colon cancer Neg Hx   . Esophageal cancer Neg Hx   . Rectal cancer Neg Hx   . Stomach cancer Neg Hx     Social History   Social History  . Marital status: Married    Spouse name: N/A  . Number of children: N/A  . Years of education: N/A   Occupational History  . retired     taught at [employer], histology, anatomy and physiology   Social History Main Topics  . Smoking status: Never Smoker  . Smokeless tobacco: Never Used  . Alcohol use No  . Drug use: No  . Sexual activity: Not Asked   Other Topics Concern  . 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  AMLODIPINE BESYLATE PO, Take 10 mg by mouth daily. , Disp: , Rfl:  .  aspirin 81 MG tablet, Take 81 mg by mouth daily., Disp: , Rfl:  .  ATENOLOL PO, Take 50 mg by mouth 2 (two) times daily. , Disp: , Rfl:  .  atorvastatin (LIPITOR) 40 MG tablet, Take 40 mg by mouth daily., Disp: , Rfl:  .  Calcium Carbonate-Vitamin D (CALTRATE 600+D) 600-400 MG-UNIT per tablet, Take 1 tablet by mouth daily., Disp: , Rfl:  .  carbidopa-levodopa (SINEMET IR) 25-100 MG tablet, Take 1 tablet by mouth 3 (three) times daily., Disp: 90 tablet, Rfl: 5 .  HUMALOG KWIKPEN 100 UNIT/ML KiwkPen, , Disp: , Rfl:  .  hydrochlorothiazide (HYDRODIURIL) 25 MG tablet, Take 25 mg by mouth daily. , Disp: , Rfl:  .  insulin glargine (LANTUS) 100 UNIT/ML injection, Inject 25 Units into the skin at bedtime. ,  Disp: , Rfl:  .  levothyroxine (SYNTHROID, LEVOTHROID) 75 MCG tablet, Take 75 mcg by mouth daily., Disp: , Rfl:  .  METFORMIN HCL PO, Take 500 mg by mouth 2 (two) times daily. , Disp: , Rfl:  .  Pioglitazone HCl (ACTOS PO), Take 45 mg by mouth daily. , Disp: , Rfl:  .  losartan (COZAAR) 50 MG tablet, Take 1 tablet (50 mg total) by mouth daily., Disp: 90 tablet, Rfl: 0  Current Facility-Administered Medications:  .  0.9 %  sodium chloride infusion, 500 mL, Intravenous, Continuous, Manus Gunning, MD  EXAM:  Vitals:   10/24/16 0942  BP: 120/60  Pulse: (!) 55  Temp: 98.3 F (36.8 C)    Body mass index is 24.24 kg/m.  GENERAL: vitals reviewed and listed above, alert, oriented, appears well hydrated and in no acute distress  HEENT: atraumatic, conjunttiva clear, no obvious abnormalities on inspection of external nose and ears  NECK: no obvious masses on inspection  LUNGS: clear to auscultation bilaterally, no wheezes, rales or rhonchi, good air movement  CV: HRRR, no peripheral edema  MS: moves all extremities without noticeable abnormality  PSYCH: pleasant and cooperative, no obvious depression or anxiety  ASSESSMENT AND PLAN:  Discussed the following assessment and plan:  Type 2 diabetes mellitus with other diabetic kidney complication, with long-term current use of insulin (HCC)  PD (Parkinson's disease) (HCC)  CKD (chronic kidney disease) stage 3, GFR 30-59 ml/min  Essential hypertension  Hypothyroidism, unspecified type  Hyperlipidemia, unspecified hyperlipidemia type  Dysphagia, unspecified type  -we will stop valsartan and try losartan to see if this is easier to swallow. Follow up 1 month. -will have assistant obtain labs done recently and eye report. -lifestyle recs, she is considering parkinson's cycling class. -Patient advised to return or notify a doctor immediately if symptoms worsen or persist or new concerns arise.  Patient Instructions   BEFORE YOU LEAVE: -follow up: 1 month  -obtain and update diabetic eye report from Dr. Herbert Deaner  -obtain recent labs from Dr. Elyse Hsu  STOP the Valsartan.  Start the losartan.  It was good to see you today!   Colin Benton R., DO

## 2016-10-24 ENCOUNTER — Ambulatory Visit (INDEPENDENT_AMBULATORY_CARE_PROVIDER_SITE_OTHER): Payer: Medicare Other | Admitting: Family Medicine

## 2016-10-24 ENCOUNTER — Encounter: Payer: Self-pay | Admitting: Family Medicine

## 2016-10-24 VITALS — BP 120/60 | HR 55 | Temp 98.3°F | Ht 65.5 in | Wt 147.9 lb

## 2016-10-24 DIAGNOSIS — E1129 Type 2 diabetes mellitus with other diabetic kidney complication: Secondary | ICD-10-CM

## 2016-10-24 DIAGNOSIS — E039 Hypothyroidism, unspecified: Secondary | ICD-10-CM

## 2016-10-24 DIAGNOSIS — N183 Chronic kidney disease, stage 3 unspecified: Secondary | ICD-10-CM

## 2016-10-24 DIAGNOSIS — G20 Parkinson's disease: Secondary | ICD-10-CM

## 2016-10-24 DIAGNOSIS — I10 Essential (primary) hypertension: Secondary | ICD-10-CM

## 2016-10-24 DIAGNOSIS — Z794 Long term (current) use of insulin: Secondary | ICD-10-CM

## 2016-10-24 DIAGNOSIS — G20A1 Parkinson's disease without dyskinesia, without mention of fluctuations: Secondary | ICD-10-CM

## 2016-10-24 DIAGNOSIS — E785 Hyperlipidemia, unspecified: Secondary | ICD-10-CM

## 2016-10-24 DIAGNOSIS — R1310 Dysphagia, unspecified: Secondary | ICD-10-CM

## 2016-10-24 MED ORDER — LOSARTAN POTASSIUM 50 MG PO TABS: 50.0000 mg | ORAL_TABLET | Freq: Every day | ORAL | 0 refills | Status: DC

## 2016-10-24 NOTE — Progress Notes (Signed)
Pre visit review using our clinic review tool, if applicable. No additional management support is needed unless otherwise documented below in the visit note. 

## 2016-10-24 NOTE — Patient Instructions (Addendum)
BEFORE YOU LEAVE: -follow up: 1 month  -obtain and update diabetic eye report from Dr. Herbert Deaner  -obtain recent labs from Dr. Elyse Hsu  STOP the Valsartan.  Start the losartan.  It was good to see you today!

## 2016-10-25 ENCOUNTER — Ambulatory Visit: Payer: Medicare Other | Admitting: Speech Pathology

## 2016-10-25 DIAGNOSIS — R471 Dysarthria and anarthria: Secondary | ICD-10-CM

## 2016-10-25 DIAGNOSIS — R2689 Other abnormalities of gait and mobility: Secondary | ICD-10-CM | POA: Diagnosis not present

## 2016-10-25 DIAGNOSIS — R1310 Dysphagia, unspecified: Secondary | ICD-10-CM

## 2016-10-25 NOTE — Patient Instructions (Addendum)
Swallowing Precautions  Small single sips of thin liquid  Take meds with pureed consistency   Hold liquid in you mouth for a brief second, then swallow  Avoid mixed consistencies - soups with meat, cereal/milk, fruit cocktail - if you take these - please be very aware and careful when swallowing - consider putting your chin down to keep the liquid from sneaking down the wrong pipe before for you are ready to swallow. Again, hold the liquid in your mouth until you are ready to swallow.  Avoid distractions while eating such as TV, conversation, paperwork, crosswords etc   If you become ill, fatigued or have an off day, you swallow will be affected. During these times, take extra care with your swallow and what you are eating to reduce risk of aspiration pneumonia.   Signs of Aspiration Pneumonia   . Chest pain/tightness . Fever (can be low grade) . Cough  o With foul-smelling phlegm (sputum) o With sputum containing pus or blood o With greenish sputum . Fatigue  . Shortness of breath  . Wheezing   **IF YOU HAVE THESE SIGNS, CONTACT YOUR DOCTOR OR GO TO THE EMERGENCY DEPARTMENT OR URGENT CARE AS SOON AS POSSIBLE**   Loud "AH!" 5x twice daily with effort of 6/10  Recite/read aloud really projecting - practice every day - pledge, prayers, verses, nursery rhymes

## 2016-10-25 NOTE — Therapy (Signed)
[facility] [address] [place], [place], [address] Phone: [phone]   Fax:  [phone]  Speech Language Pathology Treatment  Patient Details  Name: Pamela Patton MRN: [id number] Date of Birth: 19-Sep-1946 Referring Provider: [name], DO  Encounter Date: 10/25/2016      End of Session - 10/25/16 0926    Visit Number 7   Number of Visits 17   Date for SLP Re-Evaluation 12/09/16   SLP Start Time 0846   SLP Stop Time  0929   SLP Time Calculation (min) 43 min      Past Medical History:  Diagnosis Date  . Anemia   . Arthritis   . Cataract    immature   . Chest pain summer 2012  . Chicken pox   . Diverticulosis    difficulty with colonoscopy 2005  . 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 Elyse Hsu 10/08/2012  . Fibroid    fibroid tumor  . Fracture    radial/ulnar 03/2016  . GERD (gastroesophageal reflux disease)   . Heart murmur    innocent murmur  . High cholesterol   . Hypertension   . Hypothyroid, s/p radioactive iodine tx for graves 2001 08/24/2012  . Intestinal metaplasia of gastric mucosa   . Kidney disease   . PD (Parkinson's disease) ([place]) 08/08/2016  . Renal cancer ([place])   . UTI (urinary tract infection)   . Weight loss    eval with GI in 2016; s/p CT/MRI and PET scans    Past Surgical History:  Procedure Laterality Date  . ABDOMINAL HYSTERECTOMY  1989  . COLONOSCOPY    . DG BARIUM SWALLOW ([place] HX)     9 yrs ago per pt due to incomplete colonsocopy    There were no vitals filed for this visit.             ADULT SLP TREATMENT - 10/25/16 0908      General Information   Behavior/Cognition Alert;Cooperative;Pleasant mood     Treatment Provided   Treatment provided Cognitive-Linquistic;Dysphagia     Dysphagia Treatment   Temperature Spikes Noted No   Other treatment/comments Reviewed results of MBSS - pt verbalized  results with occasinal min A - required cues for rationale for taking meds with puree. Trained pt in diet modificiations/precautions, including avoiding mixed consistencies.      Cognitive-Linquistic Treatment   Treatment focused on Dysarthria   Skilled Treatment Loud /a/ to recalibrate loudness - average with 92dB with occasional min A.  Simple conversation over 18 minutes with average of 70dB with rare min A     Assessment / Recommendations / Plan   Plan Continue with current plan of care     Dysphagia Recommendations   Diet recommendations Regular;Thin liquid   Liquids provided via Cup   Medication Administration Whole meds with puree   Supervision Patient able to self feed   Compensations Slow rate;Small sips/bites;Effortful swallow   Postural Changes and/or Swallow Maneuvers Out of bed for meals;Seated upright 90 degrees     Progression Toward Goals   Progression toward goals Progressing toward goals          SLP Education - 10/25/16 0923    Education provided Yes   Education Details MBSS ed, swallow precautions, s/s of aspiration pna   Person(s) Educated Patient   Methods Explanation;Handout   Comprehension Verbalized understanding          SLP Short Term Goals -  10/25/16 [place] #1   Title pt will demo appropriate breath support in 19/20 sentence responses over three sessions   Status Achieved     SLP SHORT TERM GOAL #2   Title pt will complete HEP for dysarthria with rare min A   Time 2   Period Weeks   Status Achieved     SLP SHORT TERM GOAL #3   Title pt will respond to structured speech tasks with sentences in a noisy environment with 100% intelligibility over three sessions   Baseline 10-18-16, 10/25/16   Time 2   Period Weeks   Status On-going          SLP Long Term Goals - 10/25/16 0926      SLP LONG TERM GOAL #1   Title pt will demo swallow precautions from modified barium swallow (MBS) with POs PRN during session with  95% success   Time 5   Period Weeks   Status On-going     SLP LONG TERM GOAL #2   Title pt will produce conversational speech appropriate for 100% intelligibilty in 20 minutes conversation in noisy environment   Time 5   Period Weeks   Status On-going     SLP LONG TERM GOAL #3   Title pt will demo appropriate abdominal breath support in 20 minutes conversation   Time 5   Period Weeks   Status On-going          Plan - 10/25/16 0924    Clinical Impression Statement Volume is [facility] for 20 minute conversation with rare min A. Pt verbalized swallow precautions with occasional min A.  Continue skilld ST to maximize carryover for intelligibility and saftey of swallow.    Speech Therapy Frequency 2x / week   Treatment/Interventions Aspiration precaution training;Pharyngeal strengthening exercises;Diet toleration management by SLP;Cueing hierarchy;Internal/external aids;SLP instruction and feedback;Functional tasks;Patient/family education;Compensatory strategies;Oral motor exercises   Potential to Achieve Goals Good   Consulted and Agree with Plan of Care Patient      Patient will benefit from skilled therapeutic intervention in order to improve the following deficits and impairments:   Dysphagia, unspecified type  Dysarthria and anarthria    Problem List Patient Active Problem List   Diagnosis Date Noted  . PD (Parkinson's disease) ([place]) 08/08/2016  . Loss of weight 07/25/2016  . 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 Elyse Hsu 10/08/2012  . CKD (chronic kidney disease) stage 3, GFR 30-59 ml/min 10/08/2012  . Hypertension 08/24/2012  . Hypothyroid, s/p radioactive iodine tx for graves 2001 - managed by endocrinologist, Dr. Elyse Hsu 08/24/2012  . Hyperlipemia 08/24/2012  . Vitamin D deficiency 08/24/2012    Lovvorn, Annye Rusk MS, CCC-SLP 10/25/2016, 9:29 AM  [facility] [address] [place], [place], [address] Phone: [phone]   Fax:  [phone]   Name: Pamela Patton MRN: [id number] Date of Birth: [birth year]

## 2016-10-27 ENCOUNTER — Ambulatory Visit: Payer: Medicare Other | Admitting: Speech Pathology

## 2016-10-27 DIAGNOSIS — R2689 Other abnormalities of gait and mobility: Secondary | ICD-10-CM | POA: Diagnosis not present

## 2016-10-27 DIAGNOSIS — R471 Dysarthria and anarthria: Secondary | ICD-10-CM

## 2016-10-27 DIAGNOSIS — R1319 Other dysphagia: Secondary | ICD-10-CM

## 2016-10-27 NOTE — Therapy (Signed)
[facility] [address] [place], [place], [address] Phone: [phone]   Fax:  [phone]  Speech Language Pathology Treatment  Patient Details  Name: Pamela Patton MRN: [id number] Date of Birth: 10-03-1946 Referring Provider: [name], DO  Encounter Date: 10/27/2016      End of Session - 10/27/16 0942    Visit Number 8   Number of Visits 17   Date for SLP Re-Evaluation 12/09/16   SLP Start Time 0855   SLP Stop Time  0934   SLP Time Calculation (min) 39 min      Past Medical History:  Diagnosis Date  . Anemia   . Arthritis   . Cataract    immature   . Chest pain summer 2012  . Chicken pox   . Diverticulosis    difficulty with colonoscopy 2005  . 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 Elyse Hsu 10/08/2012  . Fibroid    fibroid tumor  . Fracture    radial/ulnar 03/2016  . GERD (gastroesophageal reflux disease)   . Heart murmur    innocent murmur  . High cholesterol   . Hypertension   . Hypothyroid, s/p radioactive iodine tx for graves 2001 08/24/2012  . Intestinal metaplasia of gastric mucosa   . Kidney disease   . PD (Parkinson's disease) ([place]) 08/08/2016  . Renal cancer ([place])   . UTI (urinary tract infection)   . Weight loss    eval with GI in 2016; s/p CT/MRI and PET scans    Past Surgical History:  Procedure Laterality Date  . ABDOMINAL HYSTERECTOMY  1989  . COLONOSCOPY    . DG BARIUM SWALLOW ([place] [facility])     9 yrs ago per pt due to incomplete colonsocopy    There were no vitals filed for this visit.      Subjective Assessment - 10/27/16 0925    Subjective Pt arrived late due to difficulty finding parking (parking lot being worked on today)   Currently in Pain? No/denies               ADULT SLP TREATMENT - 10/27/16 0926      General Information   Behavior/Cognition Alert;Cooperative;Pleasant mood     Treatment Provided    Treatment provided Cognitive-Linquistic     Dysphagia Treatment   Temperature Spikes Noted No   Liquids provided via Cup   Amount of cueing Minimal   Other treatment/comments Pt required supervision cues to follow swallow precautions holding liquid in her mouth, take small sips. Pt verbalized precautions with mod I and no overt s/s aspirations     Pain Assessment   Pain Assessment No/denies pain     Cognitive-Linquistic Treatment   Treatment focused on Dysarthria   Skilled Treatment Pt maintained loudness greater than 70dB over 15 minute conversation with supervision cues. She maintained volume conversing while walking in noisy environment with rare min A     Assessment / Recommendations / Plan   Plan Continue with current plan of care     Dysphagia Recommendations   Liquids provided via Cup   Medication Administration Whole meds with puree   Supervision Patient able to self feed   Compensations Slow rate;Small sips/bites;Effortful swallow   Postural Changes and/or Swallow Maneuvers --  hold sip in mouth prior to swallow     Progression Toward Goals   Progression toward goals Progressing toward goals  SLP Short Term Goals - 10/27/16 0941      SLP SHORT TERM GOAL #1   Title pt will demo appropriate breath support in 19/20 sentence responses over three sessions   Status Achieved     SLP SHORT TERM GOAL #2   Title pt will complete HEP for dysarthria with rare min A   Time 2   Period Weeks   Status Achieved     SLP SHORT TERM GOAL #3   Title pt will respond to structured speech tasks with sentences in a noisy environment with 100% intelligibility over three sessions   Baseline 10-18-16, 10/25/16   Time 2   Period Weeks   Status On-going          SLP Long Term Goals - 10/27/16 0941      SLP LONG TERM GOAL #1   Title pt will demo swallow precautions from modified barium swallow (MBS) with POs PRN during session with 95% success   Time 5   Period Weeks    Status On-going     SLP LONG TERM GOAL #2   Title pt will produce conversational speech appropriate for 100% intelligibilty in 20 minutes conversation in noisy environment   Time 5   Period Weeks   Status Achieved     SLP LONG TERM GOAL #3   Title pt will demo appropriate abdominal breath support in 20 minutes conversation   Time 5   Period Weeks   Status Achieved          Plan - 10/27/16 [id number]    Clinical Impression Statement Pt maintained volume over 70dB for conversation and in noisy environment. Swallow precautions followed with supervision cues. Continue skilled ST 1-2 more times to maximize carryover of volume and swallow strategies.    Speech Therapy Frequency 2x / week   Treatment/Interventions Aspiration precaution training;Pharyngeal strengthening exercises;Diet toleration management by SLP;Cueing hierarchy;Internal/external aids;SLP instruction and feedback;Functional tasks;Patient/family education;Compensatory strategies;Oral motor exercises   Potential to Achieve Goals Good   Consulted and Agree with Plan of Care Patient      Patient will benefit from skilled therapeutic intervention in order to improve the following deficits and impairments:   Dysarthria and anarthria  Dysphagia, neurologic    Problem List Patient Active Problem List   Diagnosis Date Noted  . PD (Parkinson's disease) ([facility]) 08/08/2016  . Loss of weight 07/25/2016  . 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 Elyse Hsu 10/08/2012  . CKD (chronic kidney disease) stage 3, GFR 30-59 ml/min 10/08/2012  . Hypertension 08/24/2012  . Hypothyroid, s/p radioactive iodine tx for graves 2001 - managed by endocrinologist, Dr. Elyse Hsu 08/24/2012  . Hyperlipemia 08/24/2012  . Vitamin D deficiency 08/24/2012    Lovvorn, Annye Rusk MS, CCC-SLP 10/27/2016, 9:44 AM  [facility] [address] [place], [place], [address] Phone: [phone]   Fax:  [phone]   Name: Pamela Patton MRN: [id number] Date of Birth: [birth year]

## 2016-10-31 DIAGNOSIS — E890 Postprocedural hypothyroidism: Secondary | ICD-10-CM | POA: Insufficient documentation

## 2016-10-31 DIAGNOSIS — E790 Hyperuricemia without signs of inflammatory arthritis and tophaceous disease: Secondary | ICD-10-CM | POA: Insufficient documentation

## 2016-10-31 HISTORY — DX: Hyperuricemia without signs of inflammatory arthritis and tophaceous disease: E79.0

## 2016-10-31 LAB — BASIC METABOLIC PANEL: Glucose: 158 mg/dL

## 2016-10-31 LAB — LIPID PANEL: Cholesterol: 180 mg/dL (ref 0–200)

## 2016-10-31 LAB — HEMOGLOBIN A1C: Hemoglobin A1C: 7.7

## 2016-11-01 ENCOUNTER — Ambulatory Visit: Payer: Medicare Other | Admitting: Speech Pathology

## 2016-11-01 DIAGNOSIS — R471 Dysarthria and anarthria: Secondary | ICD-10-CM

## 2016-11-01 DIAGNOSIS — R2689 Other abnormalities of gait and mobility: Secondary | ICD-10-CM | POA: Diagnosis not present

## 2016-11-01 DIAGNOSIS — R1319 Other dysphagia: Secondary | ICD-10-CM

## 2016-11-01 NOTE — Therapy (Signed)
[facility] [address] [place], [place], [address] Phone: [phone]   Fax:  [phone]  Speech Language Pathology Treatment  Patient Details  Name: Pamela Patton MRN: [id number] Date of Birth: [birth year] Referring Provider: [name], DO  Encounter Date: 11/01/2016      End of Session - 11/01/16 0924    SLP Start Time 0847   SLP Stop Time  7408   SLP Time Calculation (min) 42 min   Activity Tolerance Patient tolerated treatment well      Past Medical History:  Diagnosis Date  . Anemia   . Arthritis   . Cataract    immature   . Chest pain summer 2012  . Chicken pox   . Diverticulosis    difficulty with colonoscopy 2005  . 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 Elyse Hsu 10/08/2012  . Fibroid    fibroid tumor  . Fracture    radial/ulnar 03/2016  . GERD (gastroesophageal reflux disease)   . Heart murmur    innocent murmur  . High cholesterol   . Hypertension   . Hypothyroid, s/p radioactive iodine tx for graves 2001 08/24/2012  . Intestinal metaplasia of gastric mucosa   . Kidney disease   . PD (Parkinson's disease) ([facility]) 08/08/2016  . Renal cancer ([facility])   . UTI (urinary tract infection)   . Weight loss    eval with GI in 2016; s/p CT/MRI and PET scans    Past Surgical History:  Procedure Laterality Date  . ABDOMINAL HYSTERECTOMY  1989  . COLONOSCOPY    . DG BARIUM SWALLOW ([facility] [facility])     9 yrs ago per pt due to incomplete colonsocopy    There were no vitals filed for this visit.      Subjective Assessment - 11/01/16 0853    Subjective "I notice when I get consumed with other conversation I have to remember to be loud"               ADULT SLP TREATMENT - 11/01/16 0853      General Information   Behavior/Cognition Alert;Cooperative;Pleasant mood     Treatment Provided   Treatment provided Cognitive-Linquistic     Pain  Assessment   Pain Assessment No/denies pain     Cognitive-Linquistic Treatment   Treatment focused on Dysarthria   Skilled Treatment Pt reports she is aware when she is not making an effort to speak loudly and is able to correct volume with success. Conversation over 20 minutes maintained volume over 70dB with mod I.      Assessment / Recommendations / Plan   Plan Continue with current plan of care     Dysphagia Recommendations   Diet recommendations Regular;Thin liquid   Liquids provided via Cup   Medication Administration Whole meds with puree   Supervision Patient able to self feed   Compensations Slow rate;Small sips/bites;Effortful swallow   Postural Changes and/or Swallow Maneuvers Out of bed for meals;Seated upright 90 degrees     Progression Toward Goals   Progression toward goals Progressing toward goals          SLP Education - 11/01/16 0917    Education provided Yes   Education Details Continue loud AH. loud practice and swallow precautions upon d/c, screen 6 months   Person(s) Educated Patient   Methods Explanation;Handout   Comprehension Verbalized understanding     SPEECH THERAPY DISCHARGE SUMMARY  Visits from Start of Care: 9  Current functional level related to goals / functional outcomes: See goals   Remaining deficits: Hypokinetic dysarthria - may exacerbate with fatigue or illness   Education / Equipment: Compensations for dysarthria and dysphagia; s/s of aspiration Plan: Patient agrees to discharge.  Patient goals were met. Patient is being discharged due to meeting the stated rehab goals.  ?????          SLP Short Term Goals - November 06, 2016 0920      SLP SHORT TERM GOAL #1   Title pt will demo appropriate breath support in 19/20 sentence responses over three sessions   Status Achieved     SLP SHORT TERM GOAL #2   Title pt will complete HEP for dysarthria with rare min A   Time 2   Period Weeks   Status Achieved     SLP SHORT TERM GOAL  #3   Title pt will respond to structured speech tasks with sentences in a noisy environment with 100% intelligibility over three sessions   Baseline 10-18-16, 10/25/16   Time 2   Period Weeks   Status On-going          SLP Long Term Goals - 2016-11-06 6270      SLP LONG TERM GOAL #1   Title pt will demo swallow precautions from modified barium swallow (MBS) with POs PRN during session with 95% success   Time 5   Period Weeks   Status Achieved     SLP LONG TERM GOAL #2   Title pt will produce conversational speech appropriate for 100% intelligibilty in 20 minutes conversation in noisy environment   Time 5   Period Weeks   Status Achieved     SLP LONG TERM GOAL #3   Title pt will demo appropriate abdominal breath support in 20 minutes conversation   Time 5   Period Weeks   Status Achieved          Plan - 2016-11-06 0919    Clinical Impression Statement Pt has met all goals, reports good carryover of volume outside of therapy. She is maintaining volume over 70dB throughout session. Following swallow precautions with mod I. D/c ST - pt in agreement   Speech Therapy Frequency 2x / week   Treatment/Interventions Aspiration precaution training;Pharyngeal strengthening exercises;Diet toleration management by SLP;Cueing hierarchy;Internal/external aids;SLP instruction and feedback;Functional tasks;Patient/family education;Compensatory strategies;Oral motor exercises   Potential to Achieve Goals Good   Consulted and Agree with Plan of Care Patient      Patient will benefit from skilled therapeutic intervention in order to improve the following deficits and impairments:   Dysarthria and anarthria  Dysphagia, neurologic      G-Codes - 11/06/16 0923    Functional Assessment Tool Used NOMS   Functional Limitations Motor speech   Swallow Goal Status [phone]) At least 1 percent but less than 20 percent impaired, limited or restricted   Swallow Discharge Status [phone]) At least 1  percent but less than 20 percent impaired, limited or restricted      Problem List Patient Active Problem List   Diagnosis Date Noted  . PD (Parkinson's disease) ([facility]) 08/08/2016  . Loss of weight 07/25/2016  . 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 Elyse Hsu 10/08/2012  . CKD (chronic kidney disease) stage 3, GFR 30-59 ml/min 10/08/2012  . Hypertension 08/24/2012  . Hypothyroid, s/p radioactive iodine tx for graves 2001 - managed by endocrinologist, Dr. Elyse Hsu 08/24/2012  . Hyperlipemia 08/24/2012  . Vitamin D deficiency  08/24/2012    Terrez Ander, Annye Rusk 11/01/2016, 9:31 AM  [facility] [address] [place], [place], [address] Phone: [phone]   Fax:  [phone]   Name: Pamela Patton MRN: [id number] Date of Birth: 04-01-1946

## 2016-11-01 NOTE — Patient Instructions (Signed)
  Continue loud "AH!" 5x twice a day 5 days a week  Continue to practice loud speech - reading aloud, reciting, practicing loud conversation over the phone  Be aware of increasing volume in loud/noisy environments   Hold liquids for a brief second, then swallow

## 2016-11-08 ENCOUNTER — Encounter: Payer: Self-pay | Admitting: Family Medicine

## 2016-11-23 NOTE — Progress Notes (Signed)
HPI:  Follow up HTN: -she had some issues swollowing valsartan, so switched to losartan -here for follow up -BP running on low end at home - feels ok without dizziness, but [facility] and worries about BP being too low -she is on a numbe rof meds, is unsure why on atenolol - thinks started purely for BP - no CP, sob, palpitations, Ha, dizziness -occ LE edema   ROS: See pertinent positives and negatives per HPI.  Past Medical History:  Diagnosis Date  . Anemia   . Arthritis   . Cataract    immature   . Chest pain summer 2012  . Chicken pox   . Diverticulosis    difficulty with colonoscopy 2005  . 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 Elyse Hsu 10/08/2012  . Fibroid    fibroid tumor  . Fracture    radial/ulnar 03/2016  . GERD (gastroesophageal reflux disease)   . Heart murmur    innocent murmur  . High cholesterol   . Hypertension   . Hypothyroid, s/p radioactive iodine tx for graves 2001 08/24/2012  . Intestinal metaplasia of gastric mucosa   . Kidney disease   . PD (Parkinson's disease) ([place]) 08/08/2016  . Renal cancer ([facility] [place])   . UTI (urinary tract infection)   . Weight loss    eval with GI in 2016; s/p CT/MRI and PET scans    Past Surgical History:  Procedure Laterality Date  . ABDOMINAL HYSTERECTOMY  1989  . COLONOSCOPY    . DG BARIUM SWALLOW ([place] HX)     9 yrs ago per pt due to incomplete colonsocopy    Family History  Problem Relation Age of Onset  . Cancer Mother     liver  . Liver cancer Mother   . Stroke Father 37  . Diabetes Father   . Hypertension Father   . Diabetes Maternal Grandmother   . Prostate cancer Maternal Grandfather   . Pancreatic cancer Brother   . Diabetes Sister   . Cancer Sister   . Diabetes Sister   . Colon polyps Neg Hx   . Colon cancer Neg Hx   . Esophageal cancer Neg Hx   . Rectal cancer Neg Hx   . Stomach cancer Neg Hx     Social History    Social History  . Marital status: Married    Spouse name: N/A  . Number of children: N/A  . Years of education: N/A   Occupational History  . retired     taught at [employer], histology, anatomy and physiology   Social History Main Topics  . Smoking status: Never Smoker  . Smokeless tobacco: Never Used  . Alcohol use No  . Drug use: No  . Sexual activity: Not Asked   Other Topics Concern  . 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  AMLODIPINE BESYLATE PO, Take 10 mg by mouth daily. , Disp: , Rfl:  .  aspirin 81 MG  tablet, Take 81 mg by mouth daily., Disp: , Rfl:  .  atorvastatin (LIPITOR) 40 MG tablet, Take 40 mg by mouth daily., Disp: , Rfl:  .  Calcium Carbonate-Vitamin D (CALTRATE 600+D) 600-400 MG-UNIT per tablet, Take 1 tablet by mouth daily., Disp: , Rfl:  .  carbidopa-levodopa (SINEMET IR) 25-100 MG tablet, Take 1 tablet by mouth 3 (three) times daily., Disp: 90 tablet, Rfl: 5 .  HUMALOG KWIKPEN 100 UNIT/ML KiwkPen, , Disp: , Rfl:  .  hydrochlorothiazide (HYDRODIURIL) 25 MG tablet, Take 25 mg by mouth daily. , Disp: , Rfl:  .  insulin glargine (LANTUS) 100 UNIT/ML injection, Inject 25 Units into the skin at bedtime. , Disp: , Rfl:  .  levothyroxine (SYNTHROID, LEVOTHROID) 75 MCG tablet, Take 75 mcg by mouth daily., Disp: , Rfl:  .  losartan (COZAAR) 50 MG tablet, Take 1 tablet (50 mg total) by mouth daily., Disp: 90 tablet, Rfl: 0 .  METFORMIN HCL PO, Take 500 mg by mouth 2 (two) times daily. , Disp: , Rfl:  .  Pioglitazone HCl (ACTOS PO), Take 45 mg by mouth daily. , Disp: , Rfl:   Current  Facility-Administered Medications:  .  0.9 %  sodium chloride infusion, 500 mL, Intravenous, Continuous, Manus Gunning, MD  EXAM:  Vitals:   11/24/16 0935  BP: (!) 118/58  Pulse: (!) 55  Temp: 98.4 F (36.9 C)    Body mass index is 23.94 kg/m.  GENERAL: vitals reviewed and listed above, alert, oriented, appears well hydrated and in no acute distress  HEENT: atraumatic, conjunttiva clear, no obvious abnormalities on inspection of external nose and ears  NECK: no obvious masses on inspection  LUNGS: clear to auscultation bilaterally, no wheezes, rales or rhonchi, good air movement  CV: HRRR, no peripheral edema  MS: moves all extremities without noticeable abnormality  PSYCH: pleasant and cooperative, no obvious depression or anxiety  ASSESSMENT AND PLAN:  Discussed the following assessment and plan:  Essential hypertension  Hypotension, unspecified hypotension type  Bradycardia  PD (Parkinson's disease) ([facility])  -BP and HR a little low and we discussed maybe trying to eliminate one of her antihypertensives - after discussion of each she wants to try slow taper of atenolol -she plans to monitor at home and at the [facility] -follow up 3 months or sooner as needed -Patient advised to return or notify a doctor immediately if symptoms worsen or persist or new concerns arise.  Patient Instructions  BEFORE YOU LEAVE: -follow up: 3 months  Try to wean slowly off of the atenolol: 50mg  morning, 25mg  (1/2 tab) night for 1 week Then 25mg  morning, 25mg  night for 1 week Then 25mg  morning only for 1 week Then stop  Monitor BP and let us know if running higher or feeling unwell with tapering this medication      Colin Benton R., DO

## 2016-11-24 ENCOUNTER — Ambulatory Visit (INDEPENDENT_AMBULATORY_CARE_PROVIDER_SITE_OTHER): Payer: Medicare Other | Admitting: Family Medicine

## 2016-11-24 ENCOUNTER — Encounter: Payer: Self-pay | Admitting: Family Medicine

## 2016-11-24 VITALS — BP 118/58 | HR 55 | Temp 98.4°F | Ht 65.5 in | Wt 146.1 lb

## 2016-11-24 DIAGNOSIS — R001 Bradycardia, unspecified: Secondary | ICD-10-CM | POA: Diagnosis not present

## 2016-11-24 DIAGNOSIS — G20 Parkinson's disease: Secondary | ICD-10-CM

## 2016-11-24 DIAGNOSIS — I10 Essential (primary) hypertension: Secondary | ICD-10-CM

## 2016-11-24 DIAGNOSIS — I959 Hypotension, unspecified: Secondary | ICD-10-CM | POA: Diagnosis not present

## 2016-11-24 NOTE — Patient Instructions (Signed)
BEFORE YOU LEAVE: -follow up: 3 months  Try to wean slowly off of the atenolol: 50mg  morning, 25mg  (1/2 tab) night for 1 week Then 25mg  morning, 25mg  night for 1 week Then 25mg  morning only for 1 week Then stop  Monitor BP and let us know if running higher or feeling unwell with tapering this medication

## 2016-11-24 NOTE — Progress Notes (Signed)
Pre visit review using our clinic review tool, if applicable. No additional management support is needed unless otherwise documented below in the visit note. 

## 2016-12-19 LAB — HM DIABETES EYE EXAM

## 2017-01-19 ENCOUNTER — Other Ambulatory Visit: Payer: Self-pay | Admitting: Family Medicine

## 2017-02-06 NOTE — Progress Notes (Signed)
Pamela Patton was seen today in the movement disorders clinic for neurologic consultation at the request of Lucretia Kern, DO.  The consultation is for the evaluation of gait change, cogwheel rigidity and to r/o PD.  The records that were made available to me were reviewed.  Pt reports that she has had unsteadiness at least since June.  She fell then and broke her radius/ulna.  States that she turned around at the sink and thinks that she tripped over her feet and "raggady bottom shoes"  10/11/17 update:  The patient follows up today.  I started her on carbidopa/levodopa 25/100 and she has worked up to one tablet 3 times daily.  "I think that it is helping."  When first started the whole tablets she had mild nausea for 5 minutes only but that is better.  Pt denies falls.  Pt denies lightheadedness, near syncope.  No hallucinations.  Mood has been "pretty good." The patient has been attending therapy at the outpatient rehabilitation center for physical, occupational and speech therapy. She will finish up OT/PT this week and has ST until second week of February.   I have reviewed notes.   A MBE was requested by speech therapy and this is scheduled for 10/13/2016.  No other CV exercise besides for therapy.    02/08/17 update:  Patient seen in follow-up today.  States that "I feel perfectly fine this AM but this past weekend I had abdominal and back pain."  Was better if relaxed and stretched.  She is unsure if it is related to timing of levodopa.   Admits that she was raking leaves a few days before when it started.   She is on carbidopa/levodopa 25/100, one tablet 3 times per day (7-9am/2-3pm/7pm).  She had a modified barium swallow on 10/13/2016 that demonstrated mild pharyngeal phase dysphagia.  Regular diet with thin liquids was recommended.  She had one fall in her sleep and fell off of the bed.   ALLERGIES:  No Known Allergies  CURRENT MEDICATIONS:  Outpatient Encounter Prescriptions as of 02/08/2017    Medication Sig  . AMLODIPINE BESYLATE PO Take 10 mg by mouth daily.   Marland Kitchen aspirin 81 MG tablet Take 81 mg by mouth daily.  Marland Kitchen atorvastatin (LIPITOR) 40 MG tablet Take 40 mg by mouth daily.  . Calcium Carbonate-Vitamin D (CALTRATE 600+D) 600-400 MG-UNIT per tablet Take 1 tablet by mouth daily.  . carbidopa-levodopa (SINEMET IR) 25-100 MG tablet Take 1 tablet by mouth 3 (three) times daily.  Marland Kitchen HUMALOG KWIKPEN 100 UNIT/ML KiwkPen   . hydrochlorothiazide (HYDRODIURIL) 25 MG tablet Take 25 mg by mouth daily.   . insulin glargine (LANTUS) 100 UNIT/ML injection Inject 25 Units into the skin at bedtime.   Marland Kitchen levothyroxine (SYNTHROID, LEVOTHROID) 75 MCG tablet Take 75 mcg by mouth daily.  Marland Kitchen losartan (COZAAR) 50 MG tablet TAKE 1 TABLET BY MOUTH DAILY  . METFORMIN HCL PO Take 500 mg by mouth 2 (two) times daily.   . Pioglitazone HCl (ACTOS PO) Take 45 mg by mouth daily.    Facility-Administered Encounter Medications as of 02/08/2017  Medication  . 0.9 %  sodium chloride infusion    PAST MEDICAL HISTORY:   Past Medical History:  Diagnosis Date  . Anemia   . Arthritis   . Cataract    immature   . Chest pain summer 2012  . Chicken pox   . Diverticulosis    difficulty with colonoscopy 2005  . DM (diabetes  mellitus), type 2 with renal complications managed by endocrinologist, Dr. Elyse Hsu 10/08/2012  . Fibroid    fibroid tumor  . Fracture    radial/ulnar 03/2016  . GERD (gastroesophageal reflux disease)   . Heart murmur    innocent murmur  . High cholesterol   . Hypertension   . Hypothyroid, s/p radioactive iodine tx for graves 2001 08/24/2012  . Intestinal metaplasia of gastric mucosa   . Kidney disease   . PD (Parkinson's disease) ([place]) 08/08/2016  . Renal cancer ([place])   . UTI (urinary tract infection)   . Weight loss    eval with GI in 2016; s/p CT/MRI and PET scans    PAST SURGICAL HISTORY:   Past Surgical History:  Procedure Laterality Date  . ABDOMINAL HYSTERECTOMY  1989  .  COLONOSCOPY    . DG BARIUM SWALLOW ([place] [facility]X)     9 yrs ago per pt due to incomplete colonsocopy    SOCIAL HISTORY:   Social History   Social History  . Marital status: Married    Spouse name: N/A  . Number of children: N/A  . Years of education: N/A   Occupational History  . retired     taught at [employer], histology, anatomy and physiology   Social History Main Topics  . Smoking status: Never Smoker  . Smokeless tobacco: Never Used  . Alcohol use No  . Drug use: No  . Sexual activity: Not on file   Other Topics Concern  . 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 Mother Deceased  . Father Deceased  . Maternal Grandmother Deceased  . Maternal Grandfather Deceased  . Paternal Grandmother Deceased  . Paternal Grandfather Deceased  . Brother Deceased  . Sister Alive  . Sister Deceased  . Sister Alive  . Neg Hx     ROS:  A complete 10 system review of systems was obtained and was unremarkable apart from what is mentioned above.  PHYSICAL EXAMINATION:    VITALS:   Vitals:   02/08/17 1012  BP: 110/60  Pulse: 73  SpO2: 96%  Weight: 145 lb (65.8 kg)  Height: 5\' 6"  (1.676 m)   Wt Readings from Last 3 Encounters:  02/08/17 145 lb (65.8 kg)  11/24/16 146 lb 1.6 oz (66.3 kg)  10/24/16 147 lb 14.4 oz (67.1 kg)   LAST PILL TAKEN AT 8:40 AM and seen at 11:00 AM.  GEN:  The patient appears stated age and is in NAD. HEENT:  Normocephalic, atraumatic.  The mucous membranes are moist. The superficial temporal arteries are without ropiness or tenderness. CV:  RRR Lungs:   CTAB Neck/HEME:  There are no carotid bruits bilaterally.   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  Neurological examination:  Orientation: The patient is alert and oriented x3.  Cranial nerves: There is good facial symmetry. There is facial hypomimia.  Pupils are equal round and reactive to light bilaterally. Fundoscopic exam reveals clear  margins bilaterally. Extraocular muscles are intact. The visual fields are full to confrontational testing. The speech is fluent and clear. Speech is pseudobulbar in quality.  Soft palate rises symmetrically and there is no tongue deviation. Hearing is intact to conversational tone. Sensation: Sensation is intact to light and pinprick throughout (facial, trunk, extremities). Vibration is intact at the bilateral big toe. There is no extinction with double simultaneous stimulation. There is no sensory dermatomal level identified. Motor: Strength is 5/5 in the bilateral upper and lower extremities with the exception of abduction of fingers on the left and strength there is 4/5.   Shoulder shrug is equal and symmetric.  There is no pronator drift.   Movement examination: Tone: There is normal tone in the LUE and mild increased tone in the RUE.  Tone in the LE is good bilaterally. Abnormal movements: There is rare tremor in the L leg Coordination:  There is still decremation with RAM's, with hand opening and closing, finger taps, alternation of supination/pronation of the forearm on the left.  There is decremation with finger taps on the right.  All other forms of rapid alternating movements on the right were normal.  All forms of rapid alternating movements in the lower extremities bilaterally were normal. Gait and Station: The patient has no significant difficulty arising out of a deep-seated chair without the use of the hands. The patient's stride length is normal with decreased arm swing bilaterally.   LABS    Chemistry      Component Value  Date/Time   NA 145 06/03/2016 1444   K 4.0 06/03/2016 1444   CL 106 06/03/2016 1444   CO2 31 06/03/2016 1444   BUN 23 06/03/2016 1444   CREATININE 1.33 (H) 06/03/2016 1444   GLU 158 10/31/2016      Component Value Date/Time   CALCIUM 9.8 06/03/2016 1444   ALKPHOS 58 06/03/2016 1444   AST 13 06/03/2016 1444   ALT 10 06/03/2016 1444   BILITOT 0.6 06/03/2016 1444     Lab Results  Component Value Date   TSH 0.58 04/02/2015   No results found for: VITAMINB12   ASSESSMENT/PLAN:  1.  Parkinsonism.  I suspect that this does represent idiopathic Parkinson's disease but I do think that an atypical state like PSP is in the ddx given diplopia and pseudobulbar speech.  However, tremor is somewhat atypical in PSP.  I discussed with her that time will help [place] differentiate the 2.  In addition, I told her that treatments were the same, although I told her that prognosis was very different.  -continue carbidopa/levodopa 25/100 tid.  Currently spreading meds out a lot.  Asked her to watch closely to see if med wears off.  Suspect that may need an increase but not sure if back pain/m spasm related to wearing off or not as she experienced it after raking leaves but does seem to come and go in cycles.  Is somewhat underdosed  -congratulated patient on the exercising at the [facility] cycle program  2.  Dysphagia  -She had a modified barium swallow on 10/13/2016 that demonstrated mild pharyngeal phase dysphagia.  Regular diet with thin liquids was recommended.  3.  RBD  -fell out of bed since the last visit.  Talked to her about safety.  She doesn't want more medication but was agreeable to trying safety rails and talked to her about buying them [place]    4. Much greater than 50% of this visit was spent in counseling  and coordinating care.  Total face to face time:  30 min    Cc:  Lucretia Kern, DO

## 2017-02-08 ENCOUNTER — Encounter: Payer: Self-pay | Admitting: Neurology

## 2017-02-08 ENCOUNTER — Ambulatory Visit (INDEPENDENT_AMBULATORY_CARE_PROVIDER_SITE_OTHER): Payer: Medicare Other | Admitting: Neurology

## 2017-02-08 VITALS — BP 110/60 | HR 73 | Ht 66.0 in | Wt 145.0 lb

## 2017-02-08 DIAGNOSIS — G4752 REM sleep behavior disorder: Secondary | ICD-10-CM

## 2017-02-08 DIAGNOSIS — G20 Parkinson's disease: Secondary | ICD-10-CM

## 2017-02-08 NOTE — Patient Instructions (Signed)
Order rails for the bed on [employer] that tuck under the mattress  Watch the timing of your carbidopa/levodopa 25/100 and see if your muscle spasm/back pain is at all related to the wearing off of medication  Make a follow up appointment in 3 months

## 2017-02-13 ENCOUNTER — Other Ambulatory Visit: Payer: Self-pay | Admitting: Urology

## 2017-02-13 DIAGNOSIS — D3000 Benign neoplasm of unspecified kidney: Secondary | ICD-10-CM

## 2017-02-22 DIAGNOSIS — E1159 Type 2 diabetes mellitus with other circulatory complications: Secondary | ICD-10-CM | POA: Insufficient documentation

## 2017-02-22 DIAGNOSIS — I152 Hypertension secondary to endocrine disorders: Secondary | ICD-10-CM

## 2017-02-22 DIAGNOSIS — E1169 Type 2 diabetes mellitus with other specified complication: Secondary | ICD-10-CM | POA: Insufficient documentation

## 2017-02-22 DIAGNOSIS — I10 Essential (primary) hypertension: Secondary | ICD-10-CM | POA: Insufficient documentation

## 2017-02-22 DIAGNOSIS — E785 Hyperlipidemia, unspecified: Secondary | ICD-10-CM | POA: Insufficient documentation

## 2017-02-22 HISTORY — DX: Type 2 diabetes mellitus with other circulatory complications: E11.59

## 2017-02-22 HISTORY — DX: Hypertension secondary to endocrine disorders: I15.2

## 2017-02-22 NOTE — Progress Notes (Signed)
HPI:  Pamela Patton is a pleasant 70 y.o. here for follow up. Chronic medical problems summarized below were reviewed for changes and stability and were updated as needed below. These issues and their treatment remain stable for the most part. She is doing well off the atenolol. BP at home in the 1 teens/60s. Cycling in parkinson's cycling class and has really built up endurance and enjoys this. Will have repeat CT soon for renal lesion with her urologist. UTD on eye exam, mild retinopathy per report. Denies CP, dizziness, wounds, falls, SOB, DOE, treatment intolerance or new symptoms. Due for Medicare/CPE in June  HTN: -meds: losartan 50, amlodipine 10, hctz 25 -weaned off atenolol last visit as BP was running a little low  Diabetes w/ CKD: -meds: humalog, lantus, metformin, pioglitazone, asa, arb -sees Dr. Elyse Hsu for management  Hypothyroidism: -meds: levothyroxine -sees Dr. Elyse Hsu for management  HLD: -meds: atorvastatin  Parkinson Dz: -sees Dr. Carles Collet for management -meds: sinemet  ROS: See pertinent positives and negatives per HPI.  Past Medical History:  Diagnosis Date  . Anemia   . Arthritis   . Cataract    immature   . Chest pain summer 2012  . Chicken pox   . Diverticulosis    difficulty with colonoscopy 2005  . 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 Elyse Hsu 10/08/2012  . Fibroid    fibroid tumor  . Fracture    radial/ulnar 03/2016  . GERD (gastroesophageal reflux disease)   . Heart murmur    innocent murmur  . High cholesterol   . Hypertension   . Hypothyroid, s/p radioactive iodine tx for graves 2001 08/24/2012  . Intestinal metaplasia of gastric mucosa   . Kidney disease   . PD (Parkinson's disease) ([place]) 08/08/2016  . Renal cancer ([place])   . UTI (urinary tract infection)   . Weight loss    eval with GI in 2016; s/p CT/MRI and PET scans    Past Surgical History:  Procedure Laterality Date  .  ABDOMINAL HYSTERECTOMY  1989  . COLONOSCOPY    . DG BARIUM SWALLOW ([place] HX)     9 yrs ago per pt due to incomplete colonsocopy    Family History  Problem Relation Age of Onset  . Cancer Mother        liver  . Liver cancer Mother   . Stroke Father 51  . Diabetes Father   . Hypertension Father   . Diabetes Maternal Grandmother   . Prostate cancer Maternal Grandfather   . Pancreatic cancer Brother   . Diabetes Sister   . Cancer Sister   . Diabetes Sister   . Colon polyps Neg Hx   . Colon cancer Neg Hx   . Esophageal cancer Neg Hx   . Rectal cancer Neg Hx   . Stomach cancer Neg Hx     Social History   Social History  . Marital status: Married    Spouse name: N/A  . Number of children: N/A  . Years of education: N/A   Occupational History  . retired     taught at [employer], histology, anatomy and physiology   Social History Main Topics  . Smoking status: Never Smoker  . Smokeless tobacco: Never Used  . Alcohol use No  . Drug use: No  . Sexual activity: Not on file   Other Topics Concern  . 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  amLODipine (NORVASC) 5 MG tablet, Take 1 tablet (5 mg total) by mouth daily., Disp: 90 tablet, Rfl: 3 .  aspirin 81 MG tablet, Take 81 mg by mouth daily., Disp: , Rfl:  .  atorvastatin (LIPITOR) 40 MG tablet, Take 40 mg by mouth daily., Disp: , Rfl:  .  Calcium Carbonate-Vitamin D (CALTRATE 600+D) 600-400 MG-UNIT per tablet, Take 1 tablet by mouth daily., Disp: , Rfl:  .  carbidopa-levodopa (SINEMET IR) 25-100 MG tablet, Take 1 tablet by mouth 3 (three) times  daily., Disp: 90 tablet, Rfl: 5 .  HUMALOG KWIKPEN 100 UNIT/ML KiwkPen, , Disp: , Rfl:  .  hydrochlorothiazide (HYDRODIURIL) 25 MG tablet, Take 25 mg by mouth daily. , Disp: , Rfl:  .  insulin glargine (LANTUS) 100 UNIT/ML injection, Inject 25 Units into the skin at bedtime. , Disp: , Rfl:  .  levothyroxine (SYNTHROID, LEVOTHROID) 75 MCG tablet, Take 75 mcg by mouth daily., Disp: , Rfl:  .  losartan (COZAAR) 50 MG tablet, TAKE 1 TABLET BY MOUTH DAILY, Disp: 90 tablet, Rfl: 1 .  METFORMIN HCL PO, Take 500 mg by mouth 2 (two) times daily. , Disp: , Rfl:  .  Multiple Vitamins-Minerals (PRESERVISION AREDS PO), Take by mouth., Disp: , Rfl:  .  Pioglitazone HCl (ACTOS PO), Take 45 mg by mouth daily. , Disp: , Rfl:   Current Facility-Administered Medications:  .  0.9 %  sodium chloride infusion, 500 mL, Intravenous, Continuous, Armbruster, Renelda Loma, MD  EXAM:  Vitals:   02/23/17 0950  BP: 122/60  Pulse: 63  Temp: 98.5 F (36.9 C)    Body mass index is 23.55 kg/m.  GENERAL: vitals reviewed and listed above, alert, oriented, appears well hydrated and in no acute distress  HEENT: atraumatic, conjunttiva clear, no obvious abnormalities on inspection of external nose and ears  NECK: no obvious masses on inspection  LUNGS: clear to auscultation bilaterally, no wheezes, rales or rhonchi, good air movement  CV: HRRR, 1+ LE peripheral edema  MS: moves all extremities without noticeable abnormality  PSYCH: pleasant and cooperative, no obvious depression or anxiety  ASSESSMENT AND PLAN:  Discussed the following assessment and plan:  Hypothyroidism, unspecified type  Hypertension associated with diabetes ([place])  Hyperlipidemia associated with type 2 diabetes mellitus ([place])  PD (Parkinson's disease) ([place])  -DBP low and low at home, discussed options and she is taking 10mg  norvasc at night - may no longer need this high dose and risks may > benefit -she opted/agreed to trial  decreasing norvasc to 5 mg in am -we will plan to recheck at AWV/CPE in 1-2 months -advised if CT with contrast to discuss with endo and ordering provider holding metformin -congratulated on exercise, glad she is enjoying this and encouraged to continue -eye report obtained, reviewed and advised assistant to update HM -Patient advised to return or notify a doctor immediately if symptoms worsen or persist or new concerns arise.  Patient Instructions  BEFORE YOU LEAVE: -follow up: AWV with [name] and CPE with Dr. Maudie Mercury in June  or July (same day)  Please decrease the Norvasc (amlodipine) to 5mg  daily and take in the morning.  Continue to stay active!  Advise regular aerobic exercise (at least 150 minutes per week of sweaty exercise) and a healthy diet. Try to eat at least 5-9 servings of vegetables and fruits per day (not corn, potatoes or bananas.) Avoid sweets, red meat, pork, butter, fried foods, fast food, processed food, excessive dairy, eggs and coconut. Replace bad fats with good fats - fish, nuts and seeds, canola oil, olive oil.   WE NOW OFFER   [facility] FAST TRACK!!!  SAME DAY Appointments for ACUTE CARE  Such as: Sprains, Injuries, cuts, abrasions, rashes, muscle pain, joint pain, back pain Colds, flu, sore throats, headache, allergies, cough, fever  Ear pain, sinus and eye infections Abdominal pain, nausea, vomiting, diarrhea, upset stomach Animal/insect bites  3 Easy Ways to Schedule: Walk-In Scheduling Call in scheduling Mychart Sign-up: [email or web address]             Colin Benton R., DO

## 2017-02-23 ENCOUNTER — Encounter: Payer: Self-pay | Admitting: Family Medicine

## 2017-02-23 ENCOUNTER — Ambulatory Visit (INDEPENDENT_AMBULATORY_CARE_PROVIDER_SITE_OTHER): Payer: Medicare Other | Admitting: Family Medicine

## 2017-02-23 VITALS — BP 122/60 | HR 63 | Temp 98.5°F | Ht 66.0 in | Wt 145.9 lb

## 2017-02-23 DIAGNOSIS — E785 Hyperlipidemia, unspecified: Secondary | ICD-10-CM

## 2017-02-23 DIAGNOSIS — I10 Essential (primary) hypertension: Secondary | ICD-10-CM

## 2017-02-23 DIAGNOSIS — E1159 Type 2 diabetes mellitus with other circulatory complications: Secondary | ICD-10-CM

## 2017-02-23 DIAGNOSIS — G20 Parkinson's disease: Secondary | ICD-10-CM | POA: Diagnosis not present

## 2017-02-23 DIAGNOSIS — E039 Hypothyroidism, unspecified: Secondary | ICD-10-CM

## 2017-02-23 DIAGNOSIS — E1169 Type 2 diabetes mellitus with other specified complication: Secondary | ICD-10-CM | POA: Diagnosis not present

## 2017-02-23 DIAGNOSIS — I152 Hypertension secondary to endocrine disorders: Secondary | ICD-10-CM

## 2017-02-23 MED ORDER — AMLODIPINE BESYLATE 5 MG PO TABS: 5.0000 mg | ORAL_TABLET | Freq: Every day | ORAL | 3 refills | Status: DC

## 2017-02-23 NOTE — Patient Instructions (Signed)
BEFORE YOU LEAVE: -follow up: AWV with [name] and CPE with Dr. Maudie Mercury in June or July (same day)  Please decrease the Norvasc (amlodipine) to 5mg  daily and take in the morning.  Continue to stay active!  Advise regular aerobic exercise (at least 150 minutes per week of sweaty exercise) and a healthy diet. Try to eat at least 5-9 servings of vegetables and fruits per day (not corn, potatoes or bananas.) Avoid sweets, red meat, pork, butter, fried foods, fast food, processed food, excessive dairy, eggs and coconut. Replace bad fats with good fats - fish, nuts and seeds, canola oil, olive oil.   WE NOW OFFER   [facility] FAST TRACK!!!  SAME DAY Appointments for ACUTE CARE  Such as: Sprains, Injuries, cuts, abrasions, rashes, muscle pain, joint pain, back pain Colds, flu, sore throats, headache, allergies, cough, fever  Ear pain, sinus and eye infections Abdominal pain, nausea, vomiting, diarrhea, upset stomach Animal/insect bites  3 Easy Ways to Schedule: Walk-In Scheduling Call in scheduling Mychart Sign-up: [email or web address]

## 2017-02-28 ENCOUNTER — Other Ambulatory Visit: Payer: Self-pay | Admitting: Neurology

## 2017-02-28 MED ORDER — CARBIDOPA-LEVODOPA 25-100 MG PO TABS: 1.0000 | ORAL_TABLET | Freq: Three times a day (TID) | ORAL | 1 refills | Status: DC

## 2017-03-07 ENCOUNTER — Ambulatory Visit (HOSPITAL_COMMUNITY)
Admission: RE | Admit: 2017-03-07 | Discharge: 2017-03-07 | Disposition: A | Discharge status: Home / Self Care | Payer: Medicare Other | Source: Ambulatory Visit | Attending: Urology | Admitting: Urology

## 2017-03-07 DIAGNOSIS — K862 Cyst of pancreas: Secondary | ICD-10-CM | POA: Insufficient documentation

## 2017-03-07 DIAGNOSIS — D3000 Benign neoplasm of unspecified kidney: Secondary | ICD-10-CM | POA: Insufficient documentation

## 2017-03-07 LAB — POCT I-STAT CREATININE: Creatinine, Ser: 1.3 mg/dL — ABNORMAL HIGH (ref 0.44–1.00)

## 2017-03-07 MED ORDER — GADOBENATE DIMEGLUMINE 529 MG/ML IV SOLN
15.0000 mL | Freq: Once | INTRAVENOUS | Status: AC | PRN
  Administered 2017-03-07: 13 mL via INTRAVENOUS

## 2017-03-16 ENCOUNTER — Telehealth: Payer: Self-pay | Admitting: Physical Therapy

## 2017-03-16 DIAGNOSIS — G20 Parkinson's disease: Secondary | ICD-10-CM

## 2017-03-16 NOTE — Telephone Encounter (Signed)
Pamela Patton is scheduled for return PT, OT, and speech therapy evaluations on 04/18/17, as recommended upon pt's previous discharge from therapy.  Pt agreed to this recommendation at discharge.  If you agree, could you please send orders for PT, OT, and speech therapy via [facility]?  Thank you so much.

## 2017-03-16 NOTE — Telephone Encounter (Signed)
Orders entered

## 2017-03-21 ENCOUNTER — Ambulatory Visit (INDEPENDENT_AMBULATORY_CARE_PROVIDER_SITE_OTHER): Payer: Medicare Other | Admitting: Cardiovascular Disease

## 2017-03-21 ENCOUNTER — Encounter: Payer: Self-pay | Admitting: Cardiovascular Disease

## 2017-03-21 VITALS — BP 138/60 | Resp 85 | Ht 66.0 in | Wt 144.0 lb

## 2017-03-21 DIAGNOSIS — E1159 Type 2 diabetes mellitus with other circulatory complications: Secondary | ICD-10-CM

## 2017-03-21 DIAGNOSIS — E1169 Type 2 diabetes mellitus with other specified complication: Secondary | ICD-10-CM | POA: Diagnosis not present

## 2017-03-21 DIAGNOSIS — I10 Essential (primary) hypertension: Secondary | ICD-10-CM | POA: Diagnosis not present

## 2017-03-21 DIAGNOSIS — I779 Disorder of arteries and arterioles, unspecified: Secondary | ICD-10-CM

## 2017-03-21 DIAGNOSIS — E785 Hyperlipidemia, unspecified: Secondary | ICD-10-CM

## 2017-03-21 DIAGNOSIS — R0989 Other specified symptoms and signs involving the circulatory and respiratory systems: Secondary | ICD-10-CM | POA: Diagnosis not present

## 2017-03-21 DIAGNOSIS — I739 Peripheral vascular disease, unspecified: Principal | ICD-10-CM

## 2017-03-21 HISTORY — DX: Disorder of arteries and arterioles, unspecified: I77.9

## 2017-03-21 NOTE — Addendum Note (Signed)
Addended by: Zebedee Iba on: 03/21/2017 04:24 PM   Modules accepted: Orders

## 2017-03-21 NOTE — Assessment & Plan Note (Signed)
History of hyperlipidemia on statin therapy with recent lipid profile performed 10/31/16 revealed total cholesterol of 180.

## 2017-03-21 NOTE — Assessment & Plan Note (Signed)
History of mild to moderate left ICA stenosis by duplex ultrasound 10/27/15. She does have a carotid bruit. We will repeat carotid Doppler studies.

## 2017-03-21 NOTE — Progress Notes (Signed)
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 I last saw her in the office 09/21/15. Her risk factor profile is remarkable for diabetes, hypertension and hyperlipidemia. She had a Myoview performed 2 years ago which showed breast attenuation artifact. She has been a symptomatic since I saw her urinary health ago. She has developed Parkinson's disease in the interim.   Current Outpatient Prescriptions  Medication Sig Dispense Refill  . amLODipine (NORVASC) 5 MG tablet Take 1 tablet (5 mg total) by mouth daily. 90 tablet 3  . aspirin 81 MG tablet Take 81 mg by mouth daily.    Marland Kitchen atorvastatin (LIPITOR) 40 MG tablet Take 40 mg by mouth daily.    . Calcium Carbonate-Vitamin D (CALTRATE 600+D) 600-400 MG-UNIT per tablet Take 1 tablet by mouth daily.    . carbidopa-levodopa (SINEMET IR) 25-100 MG tablet Take 1 tablet by mouth 3 (three) times daily. 270 tablet 1  . HUMALOG KWIKPEN 100 UNIT/ML KiwkPen     . hydrochlorothiazide (HYDRODIURIL) 25 MG tablet Take 25 mg by mouth daily.     . insulin glargine (LANTUS) 100 UNIT/ML injection Inject 25 Units into the skin at bedtime.     Marland Kitchen levothyroxine (SYNTHROID, LEVOTHROID) 75 MCG tablet Take 75 mcg by mouth daily.    Marland Kitchen losartan (COZAAR) 50 MG tablet TAKE 1 TABLET BY MOUTH DAILY 90 tablet 1  . METFORMIN HCL PO Take 500 mg by mouth 2 (two) times daily.     . Multiple Vitamins-Minerals (PRESERVISION AREDS PO) Take by mouth.    . Pioglitazone HCl (ACTOS PO) Take 45 mg by mouth daily.      Current Facility-Administered Medications  Medication Dose Route Frequency Provider Last Rate Last Dose  . 0.9 %  sodium chloride infusion  500 mL Intravenous Continuous  Armbruster, Renelda Loma, MD        No Known Allergies  Social History   Social History  . Marital status: Married    Spouse name: N/A  . Number of children: N/A  . Years of education: N/A   Occupational History  . retired     taught at [employer], histology, anatomy and physiology   Social History Main Topics  . Smoking status: Never Smoker  . Smokeless tobacco: Never Used  . Alcohol use No  . Drug use: No  . Sexual activity: Not on file   Other Topics Concern  . 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  Cardiovascular: negative for chest pain, dyspnea on exertion, edema, orthopnea, palpitations, paroxysmal nocturnal dyspnea or shortness of breath Dermatological: negative for rash Respiratory: negative for cough or wheezing Urologic: negative for hematuria Abdominal: negative for  nausea, vomiting, diarrhea, bright red blood per rectum, melena, or hematemesis Neurologic: negative for visual changes, syncope, or dizziness All other systems reviewed and are otherwise negative except as noted above.    Blood pressure 138/60, resp. rate (!) 85, height 5\' 6"  (1.676 m), weight 144 lb (65.3 kg).  General appearance: alert and no distress Neck: no adenopathy, no JVD, supple, symmetrical, trachea midline, thyroid not enlarged, symmetric, no tenderness/mass/nodules and Soft left carotid bruit Lungs: clear to auscultation bilaterally Heart: regular rate and rhythm, S1, S2 normal, no murmur, click, rub or gallop Extremities: extremities normal, atraumatic,  no cyanosis or edema  EKG sinus rhythm 85 with nonspecific ST and T-wave changes. I personally reviewed this EKG.  ASSESSMENT AND PLAN:   Carotid artery disease ([place]) History of mild to moderate left ICA stenosis by duplex ultrasound 10/27/15. She does have a carotid bruit. We will repeat carotid Doppler studies.  Hypertension associated with diabetes ([place]) History of essential hypertension blood pressure measured today at 138/60 she is on hydrochlorothiazide and losartan. Continue current meds are current dosing  Hyperlipidemia associated with type 2 diabetes mellitus ([place]) History of hyperlipidemia on statin therapy with recent lipid profile performed 10/31/16 revealed total cholesterol of 180.      Lorretta Harp MD FACP,FACC,FAHA, [facility] 03/21/2017 4:09 PM

## 2017-03-21 NOTE — Assessment & Plan Note (Signed)
History of essential hypertension blood pressure measured today at 138/60 she is on hydrochlorothiazide and losartan. Continue current meds are current dosing

## 2017-03-21 NOTE — Patient Instructions (Signed)
Medication Instructions: Your physician recommends that you continue on your current medications as directed. Please refer to the Current Medication list given to you today.  Testing/Procedures: Your physician has requested that you have a carotid duplex. This test is an ultrasound of the carotid arteries in your neck. It looks at blood flow through these arteries that supply the brain with blood. Allow one hour for this exam. There are no restrictions or special instructions.  Follow-Up: Your physician wants you to follow-up in: 1 year with Dr. Berry. You will receive a reminder letter in the mail two months in advance. If you don't receive a letter, please call our office to schedule the follow-up appointment.  If you need a refill on your cardiac medications before your next appointment, please call your pharmacy.  

## 2017-04-06 ENCOUNTER — Encounter: Payer: Self-pay | Admitting: *Deleted

## 2017-04-06 DIAGNOSIS — D3000 Benign neoplasm of unspecified kidney: Secondary | ICD-10-CM

## 2017-04-06 HISTORY — DX: Benign neoplasm of unspecified kidney: D30.00

## 2017-04-18 ENCOUNTER — Ambulatory Visit: Payer: Medicare Other | Attending: Neurology | Admitting: Physical Therapy

## 2017-04-18 ENCOUNTER — Ambulatory Visit: Payer: Medicare Other

## 2017-04-18 ENCOUNTER — Ambulatory Visit: Payer: Medicare Other | Admitting: Occupational Therapy

## 2017-04-18 DIAGNOSIS — R29898 Other symptoms and signs involving the musculoskeletal system: Secondary | ICD-10-CM | POA: Diagnosis present

## 2017-04-18 DIAGNOSIS — R1310 Dysphagia, unspecified: Secondary | ICD-10-CM | POA: Insufficient documentation

## 2017-04-18 DIAGNOSIS — R471 Dysarthria and anarthria: Secondary | ICD-10-CM | POA: Diagnosis present

## 2017-04-18 DIAGNOSIS — R2689 Other abnormalities of gait and mobility: Secondary | ICD-10-CM | POA: Diagnosis present

## 2017-04-18 DIAGNOSIS — R29818 Other symptoms and signs involving the nervous system: Secondary | ICD-10-CM | POA: Diagnosis present

## 2017-04-18 DIAGNOSIS — R293 Abnormal posture: Secondary | ICD-10-CM | POA: Diagnosis present

## 2017-04-18 DIAGNOSIS — R278 Other lack of coordination: Secondary | ICD-10-CM

## 2017-04-18 DIAGNOSIS — R2681 Unsteadiness on feet: Secondary | ICD-10-CM | POA: Diagnosis present

## 2017-04-18 NOTE — Therapy (Addendum)
[facility] [address] [place], [place], [address] Phone: [phone]   Fax:  [phone]  Speech Language Pathology Evaluation  Patient Details  Name: Pamela Patton MRN: [id number] Date of Birth: October 22, 1945   Referring Provider: [name], DO  Encounter Date: 04/18/2017                   End of Session - 04/18/17 1106    Visit Number 1   Number of Visits 17   Date for SLP Re-Evaluation 06/26/17   SLP Start Time 4650    SLP Stop Time  1145   SLP Time Calculation (min) 43 min   Activity Tolerance Patient tolerated treatment well      Past Medical History:  Diagnosis Date  . Anemia   . Arthritis   . Cataract    immature   . Chest pain summer 2012  . Chicken pox   . Diverticulosis    difficulty with colonoscopy 2005  . 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 Elyse Hsu 10/08/2012  . Fibroid    fibroid tumor  . Fracture    radial/ulnar 03/2016  . GERD (gastroesophageal reflux disease)   . Heart murmur    innocent murmur  . High cholesterol   . Hypertension   . Hypothyroid, s/p radioactive iodine tx for graves 2001 08/24/2012  . Intestinal metaplasia of gastric mucosa   . Kidney disease   . PD (Parkinson's disease) ([place]) 08/08/2016  . Renal cancer ([place])   . UTI (urinary tract infection)   . Weight loss    eval with GI in 2016; s/p CT/MRI and PET scans    Past Surgical History:  Procedure Laterality Date  . ABDOMINAL HYSTERECTOMY  1989  . COLONOSCOPY    . DG BARIUM SWALLOW ([place] HX)     9 yrs ago per pt due to incomplete colonsocopy    There were no vitals filed for this visit.      Subjective Assessment - 05/09/17 1034    Subjective Pt maintained WNL loudness with sound level meter in front of her for 15 minutes.   Currently in Pain? No/denies            SLP Evaluation [facility] - 04/18/17 1110      SLP Visit Information   SLP  Received On 04/18/17   Referring Provider [name], Wells Guiles, DO   Onset Date diagnosed October 2017   Medical Diagnosis Parkinsonsism     General Information   HPI Pt was diagnosed with PD/Parkinsonism in October 2017. DiffDx not authoritatively PSP due to tremor. Pt is well-known to this SLP from previous therapy course working on speech intelligibility.      Prior Functional Status   Cognitive/Linguistic Baseline Within functional limits     Cognition   Overall Cognitive Status Within Functional Limits for tasks assessed     Auditory Comprehension   Overall Auditory Comprehension Appears within functional limits for tasks assessed     Verbal Expression   Overall Verbal Expression Appears within functional limits for tasks assessed     Oral Motor/Sensory Function   Overall Oral Motor/Sensory Function Impaired   Labial ROM Within Functional Limits   Labial Symmetry Within Functional Limits   Labial Strength Reduced   Labial Coordination Reduced   Lingual ROM Within Functional Limits   Lingual Symmetry Within Functional Limits   Lingual Strength Reduced  slight   Lingual Coordination WFL   Facial  ROM Within Functional Limits   Velum Within Functional Limits     Motor Speech   Overall Motor Speech Impaired   Phonation Low vocal intensity   Articulation Impaired   Level of Impairment Phrase   Intelligibility Intelligible   Volume Soft  Average upper 60s dB over 3 1/2 minutes       Measured when a sound level meter was placed 30 cm away from pt's mouth, 4 minutes of conversational speech was reduced today, at average 68-70dB (WNL= average 70-72dB) with range of 65-75dB. Conversational loudness was WNL for the first 90 seconds but deteriorated rapidly over the 3 minute conversation. Overall speech intelligibility for this listener in a quiet environment was at approx 100%. Production of loud /a/ averaged 91-93dB (range of 87 to 95dB) and rare cues consistently needed for loudness.    Following loud /a/ production,  conversation task for 5 minutes was completed, with reminder to use the same effort and loudness as with loud /a/. Loudness average with this increased effort was 71-73dB (range of 69 to 76dB) without cues for loudness. Pt would benefit from skilled ST in order to improve consistency in speech intelligibility and pt's QOL.  Pt reports her swallowing remains WNL after previous ST course, except for rare instances of pill dysphagia.                 SLP Short Term Goals - 04/18/17 1330      SLP SHORT TERM GOAL #1   Title pt will demo appropriate breath support in 19/20 sentence responses over three sessions   Time 4   Period Weeks   Status New     SLP SHORT TERM GOAL #2   Title pt will respond to structured speech tasks with sentences in a noisy environment with 100% intelligibility over three sessions   Time 4   Period Weeks   Status New     SLP SHORT TERM GOAL #3   Title pt will demo speech at low 70s dB average in 20 minutes mod complex conversation over three sessions   Time 4   Period Weeks   Status New                    SLP Long Term Goals - 04/18/17 1736      SLP LONG TERM GOAL #1   Title pt will produce 15 minutes mod complex conversational speech with average low 70s dB over three sessions   Time 8   Period Weeks   Status New     SLP LONG TERM GOAL #2   Title pt will demo appropriate abdominal breath support necessary for intelligible speech in 20 minutes conversation   Time 8   Period Weeks   Status New     SLP LONG TERM GOAL #3   Time --   Period --   Status --          Plan - 05/09/17 1326    Clinical Impression Statement Pt presents with suboptimal loudness spontaneously. She would benefit from skilled ST to increase conversational loudness and other cognitive linguistic deficits in order to improve everyday conversation in multiple communicaitve contexts.   Speech Therapy Frequency 2x / week    Duration --  8 weeks   Treatment/Interventions Aspiration precaution training;Pharyngeal strengthening exercises;Diet toleration management by SLP;Cueing hierarchy;Internal/external aids;SLP instruction and feedback;Functional tasks;Patient/family education;Compensatory strategies;Oral motor exercises   Potential to Achieve Goals Good   Consulted and Agree with Plan of Care  Patient      Patient will benefit from skilled therapeutic intervention in order to improve the following deficits and impairments:   Dysarthria and anarthria - Plan: SLP plan of care cert/re-cert, CANCELED: SLP plan of care cert/re-cert  Dysphagia, unspecified type - Plan: SLP plan of care cert/re-cert, CANCELED: SLP plan of care cert/re-cert    Problem List Patient Active Problem List   Diagnosis Date Noted  . 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 Carotid artery disease ([place]) 03/21/2017  . Hypertension associated with diabetes ([place]) 02/22/2017  . Hyperlipidemia associated with type 2 diabetes mellitus ([place]) 02/22/2017  . PD (Parkinson's disease) ([place]) 08/08/2016  . Loss of weight 07/25/2016  . 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 Elyse Hsu 10/08/2012  . CKD (chronic kidney disease) stage 3, GFR 30-59 ml/min 10/08/2012  . Hypothyroid, s/p radioactive iodine tx for graves 2001 - managed by endocrinologist, Dr. Elyse Hsu 08/24/2012  . Vitamin D deficiency 08/24/2012    [facility] ,MS, CCC-SLP  05/09/2017, 1:43 PM  [facility] [address] [place], [place], [address] Phone: [phone]   Fax:  [phone]  Name: Pamela Patton MRN: [id number] Date of Birth: [birth year]

## 2017-04-18 NOTE — Therapy (Signed)
[facility] [address] [place], [place], [address] Phone: [phone]   Fax:  [phone]  Physical Therapy Evaluation  Patient Details  Name: Pamela Patton MRN: [id number] Date of Birth: 1946/07/27 Referring Provider: [name], DO  Encounter Date: 04/18/2017      PT End of Session - 04/18/17 2139    Visit Number 1   Number of Visits 6   Date for PT Re-Evaluation 05/19/17   Authorization Type UHC Medicare-GCODE every 10th visit   PT Start Time 0934   PT Stop Time 1020   PT Time Calculation (min) 46 min   Activity Tolerance Patient tolerated treatment well   Behavior During Therapy [facility] for tasks assessed/performed      Past Medical History:  Diagnosis Date  . Anemia   . Arthritis   . Cataract    immature   . Chest pain summer 2012  . Chicken pox   . Diverticulosis    difficulty with colonoscopy 2005  . 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 Elyse Hsu 10/08/2012  . Fibroid    fibroid tumor  . Fracture    radial/ulnar 03/2016  . GERD (gastroesophageal reflux disease)   . Heart murmur    innocent murmur  . High cholesterol   . Hypertension   . Hypothyroid, s/p radioactive iodine tx for graves 2001 08/24/2012  . Intestinal metaplasia of gastric mucosa   . Kidney disease   . PD (Parkinson's disease) ([place]) 08/08/2016  . Renal cancer ([place])   . UTI (urinary tract infection)   . Weight loss    eval with GI in 2016; s/p CT/MRI and PET scans    Past Surgical History:  Procedure Laterality Date  . ABDOMINAL HYSTERECTOMY  1989  . COLONOSCOPY    . DG BARIUM SWALLOW ([place] HX)     9 yrs ago per pt due to incomplete colonsocopy    There were no vitals filed for this visit.       Subjective Assessment - 04/18/17 0938    Subjective Went to the Parkinson's symposium on Friday, and I enjoyed the information.  Sometimes feel heaviness with walking, in the  mornings.  Do the cycling classes at [facility] twice per week.  Pt reports no falls in the past 6 months.   Pertinent History DM, fracture L ulna/radius 03/2016, GERD, HTN   Patient Stated Goals Pt's goal for therapy is to work on improved coordination.   Currently in Pain? No/denies            [facility] PT Assessment - 04/18/17 0946      Assessment   Medical Diagnosis Parkinson's disease   Referring Provider Wells Guiles [name], DO   Onset Date/Surgical Date --  d/c from previous PT-January 2018     Precautions   Precautions Fall     Balance Screen   Has the patient fallen in the past 6 months No   How many times? --  rolled out of bed, no falls with gait   Has the patient had a decrease in activity level because of a fear of falling?  No   Is the patient reluctant to leave their home because of a fear of falling?  No     Home Social worker Private residence   Living Arrangements Alone   Available Help at Discharge Friend(s)   Type of [place] to enter   Entrance Stairs-Number  of Steps 5   Entrance Stairs-Rails Right;Can reach both;Left   Home Layout Two level;Bed/bath upstairs;Laundry or work area in basement  Intel None     Prior Function   Level of Independence Independent   Vocation Retired   Biomedical scientist retired professor at [employer] PD cycling class 2x/wk; occasionally performs [employer]! Moves exercises at home     Observation/Other Assessments   Focus on Therapeutic Outcomes (FOTO)  NA     Posture/Postural Control   Posture/Postural Control Postural limitations   Postural Limitations Forward head;Rounded Shoulders     Transfers   Transfers Sit to Stand;Stand to Sit   Sit to Stand 6: Modified independent (Device/Increase time);Without upper extremity assist;From chair/3-in-1   Five time sit to stand comments  13.94   Stand to Sit 6: Modified independent (Device/Increase time);Without upper  extremity assist;To chair/3-in-1     Ambulation/Gait   Ambulation/Gait Yes   Ambulation/Gait Assistance 7: Independent   Ambulation Distance (Feet) 500 Feet   Assistive device None   Gait Pattern Step-through pattern;Decreased arm swing - right;Decreased arm swing - left;Decreased stride length;Right flexed knee in stance;Left flexed knee in stance   Ambulation Surface Level;Indoor   Gait velocity 9.68 sec = 3.39 ft/sec     Timed Up and Go Test   TUG Normal TUG;Manual TUG;Cognitive TUG   Normal TUG (seconds) 13.41   Manual TUG (seconds) 13.28   Cognitive TUG (seconds) 11.97     High Level Balance   High Level Balance Comments MiniBESTest 25/28 (decreased ability to change gait speeds, decreased speed with head turns with gait, slowed stepping over obstacles with gait)            Objective measurements completed on examination: See above findings.                       PT Long Term Goals - 04/18/17 2153      PT LONG TERM GOAL #1   Title Pt will be independent with progressive HEP, including walking program, to address Parkinson's related deficits.  TARGET 05/26/17   Time 5   Period Weeks   Status New     PT LONG TERM GOAL #2   Title Pt will improve 3 minute walk test to at least 570 ft in 3 minute walk test for improved gait efficiency.   Time 5   Period Weeks   Status New     PT LONG TERM GOAL #3   Title Pt will ambulate at least 1000 ft, indoor/outdoor surfaces, independently for improved dynamic gait.   Time 5   Period Weeks   Status New     PT LONG TERM GOAL #4   Title Pt will verbalize plans for continued community fitness upon d/c from PT.   Time 5   Period Weeks   Status New                Plan - 04/18/17 2141    Clinical Impression Statement Pt is a 71 year old female with history of Parkinson's disease, who presents to OP PT today as recommendation from discharge from PT January 2018.  Pt presents with abnormal posture,  decreased timing and coordination with gait.  While overall measures have remained steady since discharge, she is not incorporating walking into current exercise routine, and she has decreased dynamic gait ability on MiniBESTest.  She would benefit from brief course of physical therapy  to thoroughly update comprehensive walking program and address dynamic gait.   History and Personal Factors relevant to plan of care: PMH>3 co-morbidities   Clinical Presentation Stable   Clinical Presentation due to: Dx of PD; participates in [facility] PD cycling class; no formal walking program currently   Clinical Decision Making Low   Rehab Potential Good   PT Frequency 1x / week   PT Duration --  5 weeks plus eval   PT Treatment/Interventions ADLs/Self Care Home Management;Functional mobility training;Gait training;Patient/family education;Neuromuscular re-education;Balance training;Therapeutic exercise;Therapeutic activities   PT Next Visit Plan Review [employer]! Moves as part of HEP; discuss/initiate walking program; dynamic gait/posture activities   Consulted and Agree with Plan of Care Patient      Patient will benefit from skilled therapeutic intervention in order to improve the following deficits and impairments:  Abnormal gait, Decreased balance, Decreased mobility, Difficulty walking, Postural dysfunction  Visit Diagnosis: Other abnormalities of gait and mobility  Abnormal posture  Unsteadiness on feet      G-Codes - 05-06-17 2200    Functional Assessment Tool Used (Outpatient Only) gait 553 ft in 3 minutes; decreased dynamic gait per MiniBEStest   Functional Limitation Mobility: Walking and moving around   Mobility: Walking and Moving Around Current Status [phone]) At least 20 percent but less than 40 percent impaired, limited or restricted   Mobility: Walking and Moving Around Goal Status [phone]) At least 1 percent but less than 20 percent impaired, limited or restricted       Problem  List Patient Active Problem List   Diagnosis Date Noted  . 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 Carotid artery disease ([place]) 03/21/2017  . Hypertension associated with diabetes ([place]) 02/22/2017  . Hyperlipidemia associated with type 2 diabetes mellitus ([place]) 02/22/2017  . PD (Parkinson's disease) ([place]) 08/08/2016  . Loss of weight 07/25/2016  . 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 Elyse Hsu 10/08/2012  . CKD (chronic kidney disease) stage 3, GFR 30-59 ml/min 10/08/2012  . Hypothyroid, s/p radioactive iodine tx for graves 2001 - managed by endocrinologist, Dr. Elyse Hsu 08/24/2012  . Vitamin D deficiency 08/24/2012    Micheal Murad W. 2017/05/06, 10:03 PM Frazier Butt., PT  [facility] [address] [place], [place], [address] Phone: [phone]   Fax:  [phone]  Name: Pamela Patton MRN: [id number] Date of Birth: 02/03/1946

## 2017-04-19 DIAGNOSIS — R2689 Other abnormalities of gait and mobility: Secondary | ICD-10-CM | POA: Diagnosis not present

## 2017-04-19 NOTE — Therapy (Signed)
[facility] [address] [place], [place], [address] Phone: [phone]   Fax:  [phone]  Occupational Therapy Evaluation  Patient Details  Name: Pamela Patton MRN: [id number] Date of Birth: Oct 08, 1945 Referring Provider: Dr. Carles Collet  Encounter Date: 04/18/2017      OT End of Session - 04/19/17 0814    Visit Number 1   Number of Visits 11   Date for OT Re-Evaluation 06/08/17   Authorization Type [employer] MCR   Authorization Time Period 60 days 7/17/.18-9/15/18   Authorization - Visit Number 1   Authorization - Number of Visits 10   OT Start Time 1020   OT Stop Time 1100   OT Time Calculation (min) 40 min      Past Medical History:  Diagnosis Date  . Anemia   . Arthritis   . Cataract    immature   . Chest pain summer 2012  . Chicken pox   . Diverticulosis    difficulty with colonoscopy 2005  . 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 Elyse Hsu 10/08/2012  . Fibroid    fibroid tumor  . Fracture    radial/ulnar 03/2016  . GERD (gastroesophageal reflux disease)   . Heart murmur    innocent murmur  . High cholesterol   . Hypertension   . Hypothyroid, s/p radioactive iodine tx for graves 2001 08/24/2012  . Intestinal metaplasia of gastric mucosa   . Kidney disease   . PD (Parkinson's disease) ([place]) 08/08/2016  . Renal cancer ([place])   . UTI (urinary tract infection)   . Weight loss    eval with GI in 2016; s/p CT/MRI and PET scans    Past Surgical History:  Procedure Laterality Date  . ABDOMINAL HYSTERECTOMY  1989  . COLONOSCOPY    . DG BARIUM SWALLOW ([place] HX)     9 yrs ago per pt due to incomplete colonsocopy    There were no vitals filed for this visit.      Subjective Assessment - 04/18/17 1022    Subjective  Pt with PD returns to occupational therapy with bradykinesia and decreased  LUE functional use   Pertinent History PD, left wrist fx   Patient  Stated Goals coordination and strength in her hands   Currently in Pain? No/denies           [facility] OT Assessment - 04/19/17 0001      Assessment   Diagnosis Parkinsonism   Referring Provider Dr. Carles Collet   Onset Date 03/16/17   Prior Therapy OT, PT, ST     Precautions   Precautions Fall     Balance Screen   Has the patient fallen in the past 6 months Yes   How many times? 1   Has the patient had a decrease in activity level because of a fear of falling?  No   Is the patient reluctant to leave their home because of a fear of falling?  No     Home  Environment   Family/patient expects to be discharged to: --  Townhouse   Alternate Level Stairs - Number of Steps 2 flights of stairs   Bathroom Shower/Tub Tub/Shower unit   Lives With Alone     Prior Function   Level of Independence Independent   Vocation Retired   Biomedical scientist retired professor at [employer] PD cycling class 2x/wk; occasionally performs [employer]! Moves exercises at home  ADL   Eating/Feeding Modified independent  difficulty cutting   Grooming Modified independent   Upper Body Bathing Modified independent  increased time   Lower Body Bathing Modified independent   Upper Body Dressing Increased time;Independent   Upper Body Dressing Patient Percentage --  difficulty with buttons   Lower Body Dressing Modified independent;Increased time   Engineer, materials Independent     IADL   Shopping Takes care of all shopping needs independently   Light Housekeeping --  has assistance for heavier cleaning   Meal Prep Able to complete simple warm meal prep  increased time required   Medication Management Is responsible for taking medication in correct dosages at correct time     Mobility   Mobility Status Independent     Written Expression   Handwriting Mild micrographia;100% legible     Vision Assessment   Vision Assessment Vision not tested     Cognition    Overall Cognitive Status Cognition to be further assessed in functional context PRN     Observation/Other Assessments   Simulated Eating Time (seconds) 18.60 secs   Donning Doffing Jacket Time (seconds) 40.16 secs     Coordination   Fine Motor Movements are Fluid and Coordinated No   Right 9 Hole Peg Test 30.31 secs   Left 9 Hole Peg Test 38.31 secs  significant bradykinesia   Box and Blocks RUE 38 blocks, LUE 41 blocks     ROM / Strength   AROM / PROM / Strength AROM     AROM   Overall AROM  Deficits   Overall AROM Comments RUE shoulder flexion 135 LUE 130, elbow ext RUE -20,LUE -23, Pt demonstrates limitations in left forearm supination, wrist ext . Pt requires v.c for full finger ext on left.     RUE Tone   RUE Tone Mild     LUE Tone   LUE Tone Moderate                              OT Long Term Goals - 04/19/17 0817      OT LONG TERM GOAL #1   Title Pt will verbalize understanding of ways to prevent future PD related complications and verbalize understanding of community resources.   Time 5   Period Weeks   Status New     OT LONG TERM GOAL #2   Title I with PD specific HEP following review   Time 5   Period Weeks   Status New     OT LONG TERM GOAL #3   Title Pt will demonstrate improved ease with dressing as evidenced by decreasing PPT# 4 (don/ doff jacket) to 36 secs or less.   Baseline 40.16   Time 5   Period Weeks   Status New     OT LONG TERM GOAL #4   Title Pt will verbalize understanding of adapted strategies for ADLS/IADLS in order to increase safety and independence.   Time 5   Period Weeks   Status New     OT LONG TERM GOAL #5   Title Pt willdemonstrate increased ease with feeding as evidenced by decreasing PPT#2 (simulated feeding) to 16 secs or less.   Baseline 18.60 secs   Time 5   Period Weeks   Status New               Plan - 04/19/17 4332    Clinical  Impression Statement Pt is a 71 yo female with  Parkinsonism who returns to occupational therapy. Pt presents with bradykinesia, rigidity, decreased coordination, and decreased decreased balance which impacts her performance of daily activities.Pt can benefit from skilled occupational therapy to maximize safety and independence with ADLs/ IADLS and to maintain quality of life.    Occupational Profile and client history currently impacting functional performance  PMH significant for DM, HTN, right radial/ ulnar fx in June 2017 who was diagnosed with Parkinsonism by Dr. Carles Collet 08/09/16, Pt is a retired professor, pt's deficits impact her performance of ADLs, IADLs, and leisure activities   Occupational performance deficits (Please refer to evaluation for details): ADL's;IADL's;Play;Leisure;Social Participation   Rehab Potential Good   OT Frequency 2x / week   OT Duration --  5 weeks   OT Treatment/Interventions Self-care/ADL training;Therapeutic exercise;Cognitive remediation/compensation;Visual/perceptual remediation/compensation;Splinting;Neuromuscular education;Moist Heat;Parrafin;Fluidtherapy;Energy conservation;Therapeutic exercises;Patient/family education;Balance training;Therapeutic activities;Passive range of motion;Manual Therapy;DME and/or AE instruction;Contrast Bath;Ultrasound;Cryotherapy   Plan initate HEP, adapted strategies for ADLS/IADLs.   Clinical Decision Making Limited treatment options, no task modification necessary   Consulted and Agree with Plan of Care Patient      Patient will benefit from skilled therapeutic intervention in order to improve the following deficits and impairments:     Visit Diagnosis: Other lack of coordination - Plan: Ot plan of care cert/re-cert  Abnormal posture - Plan: Ot plan of care cert/re-cert  Unsteadiness on feet - Plan: Ot plan of care cert/re-cert  Other symptoms and signs involving the nervous system - Plan: Ot plan of care cert/re-cert  Other symptoms and signs involving the  musculoskeletal system - Plan: Ot plan of care cert/re-cert      G-Codes - 61/68/37 0836    Functional Assessment Tool Used (Outpatient only) PPT#4 :40.16, PPT#2: 18.60   Functional Limitation Self care   Self Care Current Status (G9021) At least 20 percent but less than 40 percent impaired, limited or restricted   Self Care Goal Status (J1552) At least 1 percent but less than 20 percent impaired, limited or restricted      Problem List Patient Active Problem List   Diagnosis Date Noted  . 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 Carotid artery disease ([place]) 03/21/2017  . Hypertension associated with diabetes ([place]) 02/22/2017  . Hyperlipidemia associated with type 2 diabetes mellitus ([place]) 02/22/2017  . PD (Parkinson's disease) ([place]) 08/08/2016  . Loss of weight 07/25/2016  . 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 Elyse Hsu 10/08/2012  . CKD (chronic kidney disease) stage 3, GFR 30-59 ml/min 10/08/2012  . Hypothyroid, s/p radioactive iodine tx for graves 2001 - managed by endocrinologist, Dr. Elyse Hsu 08/24/2012  . Vitamin D deficiency 08/24/2012    RINE,KATHRYN 04/19/2017, 8:43 AM Theone Murdoch, OTR/L [phone] Phone: [phone] 8:43 AM 04/19/17 [facility] [address] [place], [place], [address] Phone: [phone]   Fax:  [phone]  Name: [place] Patton MRN: [id number] Date of Birth: July 30, 1946

## 2017-04-25 NOTE — Progress Notes (Signed)
HPI:  Here for CPE: Seeing [place] Patton for AWV in August. Due for Hgba1c, BP labs, fasting lipids.  -Concerns and/or follow up today:   HTN: -meds: losartan 50, amlodipine 10, hctz 25 -weaned off atenolol last visit as BP was running a little low  Diabetes w/ CKD: -meds: humalog, lantus, metformin, pioglitazone, asa, arb -sees Dr. Elyse Hsu for management  Hypothyroidism: -meds: levothyroxine -sees Dr. Elyse Hsu for management  HLD: -meds: atorvastatin  Parkinson Dz: -sees Dr. Carles Collet for management -meds: sinemet  -Diet: variety of foods, balance and well rounded  -Exercise: regular exercise  -Taking folic acid, vitamin D or calcium: taking vit D and calcium  -Diabetes and Dyslipidemia Screening: plans to do labs with her endocrinologist - prefers to do there  -Vaccines: UTD  -pap history: n/a  -wants STI testing (Hep C if born [birth year]): no  -FH breast, colon or ovarian ca: see FH Last mammogram: utd Last colon cancer screening: utd  -Alcohol, Tobacco, drug use: see social history  Review of Systems - no fevers, unintentional weight loss, vision loss, hearing loss, chest pain, sob, hemoptysis, melena, hematochezia, hematuria, genital discharge, changing or concerning skin lesions, bleeding, bruising, loc, thoughts of self harm or SI  Past Medical History:  Diagnosis Date  . Anemia   . Arthritis   . Cataract    immature   . Chest pain summer 2012  . Chicken pox   . Diverticulosis    difficulty with colonoscopy 2005  . 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 Elyse Hsu 10/08/2012  . Fibroid    fibroid tumor  . Fracture    radial/ulnar 03/2016  . GERD (gastroesophageal reflux disease)   . Heart murmur    innocent murmur  . High cholesterol   . Hypertension   . Hypothyroid, s/p radioactive iodine tx for graves 2001 08/24/2012  . Intestinal metaplasia of gastric mucosa   . Kidney disease   . PD (Parkinson's  disease) ([place]) 08/08/2016  . Renal cancer ([place])   . UTI (urinary tract infection)   . Weight loss    eval with GI in 2016; s/p CT/MRI and PET scans    Past Surgical History:  Procedure Laterality Date  . ABDOMINAL HYSTERECTOMY  1989  . COLONOSCOPY    . DG BARIUM SWALLOW ([place] HX)     9 yrs ago per pt due to incomplete colonsocopy    Family History  Problem Relation Age of Onset  . Cancer Mother        liver  . Liver cancer Mother   . Stroke Father 25  . Diabetes Father   . Hypertension Father   . Diabetes Maternal Grandmother   . Prostate cancer Maternal Grandfather   . Pancreatic cancer Brother   . Diabetes Sister   . Cancer Sister   . Diabetes Sister   . Colon polyps Neg Hx   . Colon cancer Neg Hx   . Esophageal cancer Neg Hx   . Rectal cancer Neg Hx   . Stomach cancer Neg Hx     Social History   Social History  . Marital status: Single    Spouse name: N/A  . Number of children: N/A  . Years of education: N/A   Occupational History  . retired     taught at [employer], histology, anatomy and physiology   Social History Main Topics  . Smoking status: Never Smoker  . Smokeless tobacco: Never Used  . Alcohol use No  . Drug  use: No  . Sexual activity: Not Asked   Other Topics Concern  . 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  aspirin 81 MG tablet, Take 81 mg by mouth daily., Disp: , Rfl:  .  atorvastatin (LIPITOR) 40 MG tablet, Take 40 mg by mouth daily., Disp: , Rfl:  .  Calcium Carbonate-Vitamin D (CALTRATE 600+D) 600-400 MG-UNIT per tablet, Take 1 tablet by mouth daily., Disp: , Rfl:   .  carbidopa-levodopa (SINEMET IR) 25-100 MG tablet, Take 1 tablet by mouth 3 (three) times daily., Disp: 270 tablet, Rfl: 1 .  HUMALOG KWIKPEN 100 UNIT/ML KiwkPen, , Disp: , Rfl:  .  hydrochlorothiazide (HYDRODIURIL) 25 MG tablet, Take 25 mg by mouth daily. , Disp: , Rfl:  .  insulin glargine (LANTUS) 100 UNIT/ML injection, Inject 25 Units into the skin at bedtime. , Disp: , Rfl:  .  levothyroxine (SYNTHROID, LEVOTHROID) 75 MCG tablet, Take 75 mcg by mouth daily., Disp: , Rfl:  .  losartan (COZAAR) 50 MG tablet, TAKE 1 TABLET BY MOUTH DAILY, Disp: 90 tablet, Rfl: 1 .  METFORMIN HCL PO, Take 500 mg by mouth 2 (two) times daily. , Disp: , Rfl:  .  Multiple Vitamins-Minerals (PRESERVISION AREDS PO), Take by mouth., Disp: , Rfl:  .  Pioglitazone HCl (ACTOS PO), Take 45 mg by mouth daily. , Disp: , Rfl:   Current Facility-Administered Medications:  .  0.9 %  sodium chloride infusion, 500 mL, Intravenous, Continuous, Armbruster, Renelda Loma, MD  EXAM:  Vitals:   04/27/17 0818  BP: 102/60  Pulse: 69  Temp: 98.6 F (37 C)    GENERAL: vitals reviewed and listed below, alert, oriented, appears well hydrated and in no acute distress  HEENT: head atraumatic, PERRLA, normal appearance of eyes, ears, nose and mouth. moist mucus membranes.  NECK: supple, no masses or lymphadenopathy  LUNGS: clear to auscultation bilaterally, no rales, rhonchi or wheeze  CV: HRRR, no peripheral edema or cyanosis, normal pedal pulses, trace ankle edema  ABDOMEN: bowel sounds normal, soft, non tender to palpation, no masses, no rebound or guarding  BREAST: normal appearance - no skin lesions or discharge noted on inspection of both breasts, on palpation of both breast and axillary region no suspicious lesions appreciated today  GU: declined  RECTAL: deferred  SKIN: no rash or abnormal lesions  MS: normal gait, moves all extremities normally  NEURO: normal gait, speech and thought processing grossly  intact, muscle tone grossly intact throughout  PSYCH: normal affect, pleasant and cooperative  ASSESSMENT AND PLAN:  Discussed the following assessment and plan:  Encounter for preventive health examination  Screening for depression  Hypertension associated with diabetes ([place])  Carotid artery disease, unspecified laterality ([place])  Type 2 diabetes mellitus with other diabetic kidney complication, with long-term current use of insulin ([place])  Hyperlipidemia associated with type 2 diabetes mellitus ([place])  PD (Parkinson's disease) ([place])  Hypothyroidism, unspecified type   -Discussed and advised all [place] preventive services health task force level A  and B recommendations for age, sex and risks.  -Advised at least 150 minutes of exercise per week and a healthy diet with avoidance of (less then 1 serving per week) processed foods, white starches, red meat, fast foods and sweets and consisting of: * 5-9 servings of fresh fruits and vegetables (not corn or potatoes) *nuts and seeds, beans *olives and olive oil *lean meats such as fish and white chicken  *whole grains  -labs, studies and vaccines per orders this encounter  -she insists on doing labs with her endocrinologist - asks that she bring copies for [place]  -will stop norvasc and follow up on BP at her wellness visit   No orders of the defined types were placed in this encounter.   Patient advised to return to clinic immediately if symptoms worsen or persist or new concerns.  Patient Instructions  BEFORE YOU LEAVE: -keep follow up with [place] Pat[place] as scheduled for your Wellness Visit (schedule follow up the same day with Dr. Maudie Mercury to recheck blood pressure) -follow up: 3-4 months -You wanted to do labs with your endocrinologist: Hgba1c, CBC, BMP and cholesterol panel - please bring a copy to your next visit   Please STOP taking the Norvasc (amlodipine) - recheck blood pressure at your wellness visit  We have ordered labs or  studies at this visit. It can take up to 1-2 weeks for results and processing. IF results require follow up or explanation, we will call you with instructions. Clinically stable results will be released to your [facility]. If you have not heard from [place] or cannot find your results in [facility] in 2 weeks please contact our office at [phone].  If you are not yet signed up for [facility], please consider signing up.   We recommend the following healthy lifestyle for LIFE:   Eat a healthy clean diet.   TRY TO EAT: -at least 5-7 servings of low sugar vegetables per day (not corn, potatoes or bananas.) -berries are the best choice if you wish to eat fruit.   -lean meets (fish, chicken or [place] breasts) -vegan proteins for some meals - beans or tofu, whole grains, nuts and seeds -Replace bad fats with good fats - good fats include: fish, nuts and seeds, canola oil, olive oil -small amounts of low fat or non fat dairy -small amounts of100 % whole grains - check the lables  AVOID: -SUGAR, sweets, anything with added sugar, corn syrup or sweeteners -if you must have a sweetener, small amounts of stevia may be best -sweetened beverages -simple starches (rice, bread, potatoes, pasta, chips, etc - small amounts of 100% whole grains are ok) -red meat, pork, butter -fried foods, fast food, processed food, excessive dairy, eggs and coconut.  3)Get at least 150 minutes of sweaty aerobic exercise per week.  4)Reduce stress - consider counseling, meditation and relaxation to balance other aspects of your life.        No Follow-up on file.  Colin Benton R., DO

## 2017-04-27 ENCOUNTER — Ambulatory Visit (INDEPENDENT_AMBULATORY_CARE_PROVIDER_SITE_OTHER): Payer: Medicare Other | Admitting: Family Medicine

## 2017-04-27 ENCOUNTER — Encounter: Payer: Self-pay | Admitting: Family Medicine

## 2017-04-27 VITALS — BP 102/60 | HR 69 | Temp 98.6°F | Ht 65.5 in | Wt 143.7 lb

## 2017-04-27 DIAGNOSIS — Z794 Long term (current) use of insulin: Secondary | ICD-10-CM

## 2017-04-27 DIAGNOSIS — E1169 Type 2 diabetes mellitus with other specified complication: Secondary | ICD-10-CM

## 2017-04-27 DIAGNOSIS — E039 Hypothyroidism, unspecified: Secondary | ICD-10-CM

## 2017-04-27 DIAGNOSIS — E785 Hyperlipidemia, unspecified: Secondary | ICD-10-CM

## 2017-04-27 DIAGNOSIS — G20 Parkinson's disease: Secondary | ICD-10-CM | POA: Diagnosis not present

## 2017-04-27 DIAGNOSIS — I779 Disorder of arteries and arterioles, unspecified: Secondary | ICD-10-CM | POA: Diagnosis not present

## 2017-04-27 DIAGNOSIS — Z0000 Encounter for general adult medical examination without abnormal findings: Secondary | ICD-10-CM

## 2017-04-27 DIAGNOSIS — I10 Essential (primary) hypertension: Secondary | ICD-10-CM | POA: Diagnosis not present

## 2017-04-27 DIAGNOSIS — Z1331 Encounter for screening for depression: Secondary | ICD-10-CM

## 2017-04-27 DIAGNOSIS — Z1389 Encounter for screening for other disorder: Secondary | ICD-10-CM | POA: Diagnosis not present

## 2017-04-27 DIAGNOSIS — G20A1 Parkinson's disease without dyskinesia, without mention of fluctuations: Secondary | ICD-10-CM

## 2017-04-27 DIAGNOSIS — I739 Peripheral vascular disease, unspecified: Secondary | ICD-10-CM

## 2017-04-27 DIAGNOSIS — E1129 Type 2 diabetes mellitus with other diabetic kidney complication: Secondary | ICD-10-CM | POA: Diagnosis not present

## 2017-04-27 DIAGNOSIS — E1159 Type 2 diabetes mellitus with other circulatory complications: Secondary | ICD-10-CM | POA: Diagnosis not present

## 2017-04-27 LAB — BASIC METABOLIC PANEL
BUN: 23 — AB (ref 4–21)
CREATININE: 1.2 — AB (ref 0.5–1.1)
Glucose: 154
POTASSIUM: 3.8 (ref 3.4–5.3)
SODIUM: 142 (ref 137–147)

## 2017-04-27 LAB — HEPATIC FUNCTION PANEL
ALT: 10 (ref 7–35)
AST: 14 (ref 13–35)
Alkaline Phosphatase: 55 (ref 25–125)

## 2017-04-27 LAB — LIPID PANEL
CHOLESTEROL: 156 (ref 0–200)
HDL: 72 — AB (ref 35–70)
LDL Cholesterol: 63
Triglycerides: 49 (ref 40–160)

## 2017-04-27 LAB — TSH: TSH: 0.3 — AB (ref 0.41–5.90)

## 2017-04-27 LAB — HEMOGLOBIN A1C: Hemoglobin A1C: 6.6

## 2017-04-27 NOTE — Patient Instructions (Addendum)
BEFORE YOU LEAVE: -keep follow up with [place] Patton as scheduled for your Wellness Visit (schedule follow up the same day with Dr. Maudie Patton to recheck blood pressure) -follow up: 3-4 months -You wanted to do labs with your endocrinologist: Hgba1c, CBC, BMP and cholesterol panel - please bring a copy to your next visit   Please STOP taking the Norvasc (amlodipine) - recheck blood pressure at your wellness visit  We have ordered labs or studies at this visit. It can take up to 1-2 weeks for results and processing. IF results require follow up or explanation, we will call you with instructions. Clinically stable results will be released to your [facility]. If you have not heard from [place] or cannot find your results in [facility] in 2 weeks please contact our office at [phone].  If you are not yet signed up for [facility], please consider signing up.   We recommend the following healthy lifestyle for LIFE:   Eat a healthy clean diet.   TRY TO EAT: -at least 5-7 servings of low sugar vegetables per day (not corn, potatoes or bananas.) -berries are the best choice if you wish to eat fruit.   -lean meets (fish, chicken or [place] breasts) -vegan proteins for some meals - beans or tofu, whole grains, nuts and seeds -Replace bad fats with good fats - good fats include: fish, nuts and seeds, canola oil, olive oil -small amounts of low fat or non fat dairy -small amounts of100 % whole grains - check the lables  AVOID: -SUGAR, sweets, anything with added sugar, corn syrup or sweeteners -if you must have a sweetener, small amounts of stevia may be best -sweetened beverages -simple starches (rice, bread, potatoes, pasta, chips, etc - small amounts of 100% whole grains are ok) -red meat, pork, butter -fried foods, fast food, processed food, excessive dairy, eggs and coconut.  3)Get at least 150 minutes of sweaty aerobic exercise per week.  4)Reduce stress - consider counseling, meditation and relaxation to balance  other aspects of your life.

## 2017-05-01 ENCOUNTER — Ambulatory Visit (HOSPITAL_COMMUNITY)
Admission: RE | Admit: 2017-05-01 | Discharge: 2017-05-01 | Disposition: A | Discharge status: Home / Self Care | Payer: Medicare Other | Source: Ambulatory Visit | Attending: Cardiovascular Disease | Admitting: Cardiovascular Disease

## 2017-05-01 DIAGNOSIS — R0989 Other specified symptoms and signs involving the circulatory and respiratory systems: Secondary | ICD-10-CM | POA: Diagnosis not present

## 2017-05-01 DIAGNOSIS — I6523 Occlusion and stenosis of bilateral carotid arteries: Secondary | ICD-10-CM | POA: Insufficient documentation

## 2017-05-01 DIAGNOSIS — I779 Disorder of arteries and arterioles, unspecified: Secondary | ICD-10-CM

## 2017-05-01 DIAGNOSIS — E785 Hyperlipidemia, unspecified: Secondary | ICD-10-CM | POA: Diagnosis not present

## 2017-05-01 DIAGNOSIS — I739 Peripheral vascular disease, unspecified: Secondary | ICD-10-CM

## 2017-05-01 DIAGNOSIS — I10 Essential (primary) hypertension: Secondary | ICD-10-CM | POA: Diagnosis not present

## 2017-05-01 DIAGNOSIS — E119 Type 2 diabetes mellitus without complications: Secondary | ICD-10-CM | POA: Insufficient documentation

## 2017-05-03 ENCOUNTER — Encounter: Payer: Self-pay | Admitting: Family Medicine

## 2017-05-03 NOTE — Progress Notes (Addendum)
Subjective:   Pamela Patton is a 71 y.o. female who presents for Medicare Annual (Subsequent) preventive examination.  The Patient was informed that the wellness visit is to identify future health risk and educate and initiate measures that can reduce risk for increased disease through the lifespan.    Annual Wellness Assessment  Reports health as ok. One day at a time Has always done her hair and now has to get assistance Discusses parkinson  Lives in a town home/ 3 level  Now it is not an issue Is aware of safety and making home safer by removing rugs etc    Preventive Screening -Counseling & Management  Medicare Annual Preventive Care Visit - Subsequent Last OV 04/27/17 Colonoscopy 07/2016 Mammogram 10/2015- tbs when time Bone density hx of fall 2017 - June Ed on bone density Calcium and vit d  Agreed to review the osteoporosis foundation site for more info and will consider dexa when able  Sister and cousin, son in [place] From [place]/ discussed taking a train as she is not as comfortable traveling  [employer] as poor, fair, good or great?  Sees Dr [name] for [employer] attitude and is adjusting day by day  VS reviewed;  bp 7/2 102/60  04/27/17 03/21/17 02/23/17  BP 102/60 138/60 122/60       Diet diabetic; A1c 6.6  Cooks less now  Was 190  More careful now of food choices; more consistent w meals    Hyperlipidemia -labs great    BMI 23 feels good this weight  Exercise reported x 5 days a week  Now starting a cycling class and is free mon -th x45 min or 5 miles  Rotations oer minute is up  Hx fall w pt ordered by Dr [name]   Stressors: She is adjusting to her disabilites Discusses loss of brother to pancreatic cancer x3 years  Strongest strength is being able to change her attitude Uses focus to shift thinking Thinks about being angry to determine if it is worth it Turns the TV off No depression voiced  Sleep patterns: sleeps  well  Pain? No  Dr Maudie Mercury to see to review meds Dc norvasc     Cardiac Risk Factors Addressed Hyperlipidemia under good control Diabetes managed A1c 6.6 Obesity-neg  Advanced Directives Will try to complete AD; Given copy  Referred to [facility] for questions [place] offers free advance directive forms, as well as assistance in completing the forms themselves. For assistance, contact the Spiritual Care Department at [phone], or the Clinical Social Work Department at [phone].     Patient Care Team: Lucretia Kern, DO as PCP - General (Family Medicine) Altheimer, Legrand Como, MD as Attending Physician (Endocrinology) Lorretta Harp, MD as Consulting Physician (Cardiology) Ardis Hughs, MD as Attending Physician (Urology) Assessed for additional providers  Immunization History  Administered Date(s) Administered  . Influenza Whole 07/03/2012  . Influenza-Unspecified 07/04/2015, 07/04/2016  . Pneumococcal Conjugate-13 06/17/2014  . Pneumococcal Polysaccharide-23 10/03/2009, 02/01/2011  . Tdap 03/03/2009, 03/12/2016  . Zoster 01/31/2010   Required Immunizations needed today  Screening test up to date or reviewed for plan of completion Health Maintenance Due  Topic Date Due  . INFLUENZA VACCINE  05/03/2017   Fully vaccine not available today.       Cardiac Risk Factors include: advanced age (>65men, >39 women);diabetes mellitus;dyslipidemia;hypertension     Objective:     Vitals: Ht 5\' 5"  (1.651 m)   Wt 142 lb (64.4 kg)  BMI 23.63 kg/m   Body mass index is 23.63 kg/m.   Tobacco History  Smoking Status  . Never Smoker  Smokeless Tobacco  . Never Used     Counseling given: Yes   Past Medical History:  Diagnosis Date  . Anemia   . Arthritis   . Cataract    immature   . Chest pain summer 2012  . Chicken pox   . Diverticulosis    difficulty with colonoscopy 2005  . DM (diabetes mellitus), type 2 with renal complications managed by  endocrinologist, Dr. Elyse Hsu 10/08/2012  . Fibroid    fibroid tumor  . Fracture    radial/ulnar 03/2016  . GERD (gastroesophageal reflux disease)   . Heart murmur    innocent murmur  . High cholesterol   . Hypertension   . Hypothyroid, s/p radioactive iodine tx for graves 2001 08/24/2012  . Intestinal metaplasia of gastric mucosa   . Kidney disease   . PD (Parkinson's disease) ([place]) 08/08/2016  . Renal cancer ([place])   . UTI (urinary tract infection)   . Weight loss    eval with GI in 2016; s/p CT/MRI and PET scans   Past Surgical History:  Procedure Laterality Date  . ABDOMINAL HYSTERECTOMY  1989  . COLONOSCOPY    . DG BARIUM SWALLOW ([place] [facility])     9 yrs ago per pt due to incomplete colonsocopy   Family History  Problem Relation Age of Onset  . Cancer Mother        liver  . Liver cancer Mother   . Stroke Father 65  . Diabetes Father   . Hypertension Father   . Diabetes Maternal Grandmother   . Prostate cancer Maternal Grandfather   . Pancreatic cancer Brother   . Diabetes Sister   . Cancer Sister   . Diabetes Sister   . Colon polyps Neg Hx   . Colon cancer Neg Hx   . Esophageal cancer Neg Hx   . Rectal cancer Neg Hx   . Stomach cancer Neg Hx    History  Sexual Activity  . Sexual activity: Not on file    Outpatient Encounter Prescriptions as of 05/04/2017  Medication Sig  . aspirin 81 MG tablet Take 81 mg by mouth daily.  Marland Kitchen atorvastatin (LIPITOR) 40 MG tablet Take 40 mg by mouth daily.  . Calcium Carbonate-Vitamin D (CALTRATE 600+D) 600-400 MG-UNIT per tablet Take 1 tablet by mouth daily.  . carbidopa-levodopa (SINEMET IR) 25-100 MG tablet Take 1 tablet by mouth 3 (three) times daily.  Marland Kitchen HUMALOG KWIKPEN 100 UNIT/ML KiwkPen   . hydrochlorothiazide (HYDRODIURIL) 25 MG tablet Take 25 mg by mouth daily.   . insulin glargine (LANTUS) 100 UNIT/ML injection Inject 25 Units into the skin at bedtime.   Marland Kitchen levothyroxine (SYNTHROID, LEVOTHROID) 75 MCG tablet Take 75 mcg by  mouth daily.  Marland Kitchen losartan (COZAAR) 50 MG tablet TAKE 1 TABLET BY MOUTH DAILY  . METFORMIN HCL PO Take 500 mg by mouth 2 (two) times daily.   . Multiple Vitamins-Minerals (PRESERVISION AREDS PO) Take by mouth.  . Pioglitazone HCl (ACTOS PO) Take 45 mg by mouth daily.    Facility-Administered Encounter Medications as of 05/04/2017  Medication  . 0.9 %  sodium chloride infusion    Activities of Daily Living In your present state of health, do you have any difficulty performing the following activities: 05/04/2017  Hearing? N  Vision? Y  Difficulty concentrating or making decisions? N  Walking or climbing stairs?  N  Dressing or bathing? N  Comment goes slow  Doing errands, shopping? N  Preparing Food and eating ? N  Comment doesn't cook at much  Using the Toilet? N  In the past six months, have you accidently leaked urine? N  Do you have problems with loss of bowel control? N  Managing your Medications? N  Managing your Finances? N  Housekeeping or managing your Housekeeping? N  Some recent data might be hidden    Patient Care Team: Lucretia Kern, DO as PCP - General (Family Medicine) Altheimer, Legrand Como, MD as Attending Physician (Endocrinology) Lorretta Harp, MD as Consulting Physician (Cardiology) Ardis Hughs, MD as Attending Physician (Urology)    Assessment:     Exercise Activities and Dietary recommendations Current Exercise Habits: Structured exercise class, Time (Minutes): 45, Frequency (Times/Week): 2, Weekly Exercise (Minutes/Week): 90, Intensity: Moderate  Goals    . patient centered          Get her home more organized Finding time is an issue  Uses her strengths she dev during fall to help her now       Fall Risk Fall Risk  04/27/2017 02/08/2017 10/11/2016 08/08/2016 03/22/2016  Falls in the past year? No Yes Yes Yes Yes  Number falls in past yr: - 1 1 1 1   Injury with Fall? - No Yes Yes Yes  Follow up - Falls evaluation completed Falls evaluation  completed Falls evaluation completed -   Depression Screen PHQ 2/9 Scores 04/27/2017 03/22/2016 04/02/2015 07/03/2013  PHQ - 2 Score 0 0 1 0     Cognitive Function neg no issues     6CIT Screen 05/04/2017  What Year? 0 points  What month? 0 points  What time? 0 points  Count back from 20 0 points  Months in reverse 0 points  Repeat phrase 0 points  Total Score 0    Immunization History  Administered Date(s) Administered  . Influenza Whole 07/03/2012  . Influenza-Unspecified 07/04/2015, 07/04/2016  . Pneumococcal Conjugate-13 06/17/2014  . Pneumococcal Polysaccharide-23 10/03/2009, 02/01/2011  . Tdap 03/03/2009, 03/12/2016  . Zoster 01/31/2010   Screening Tests Health Maintenance  Topic Date Due  . INFLUENZA VACCINE  05/03/2017  . FOOT EXAM  07/25/2017  . MAMMOGRAM  10/18/2017  . HEMOGLOBIN A1C  10/28/2017  . OPHTHALMOLOGY EXAM  12/19/2017  . COLONOSCOPY  07/28/2021  . TETANUS/TDAP  03/12/2026  . DEXA SCAN  Completed  . Hepatitis C Screening  Addressed  . PNA vac Low Risk Adult  Completed      Plan:      PCP Notes Assessment   Health Maintenance The patient stated she will try to have a mammogram soon. The patient had a wrist fracture on 03/12/2016. She is taking calcium and vitamin D but recommended a DEXA scan in her future. Refer to the osteoporosis Foundation for further information. Will discuss with Dr. Maudie Mercury All other health maintenance seems to be up-to-date at this time.  Abnormal Screens  There were no abnormal screens. Mental status is good. Spent 15 minutes reviewing her a captation skills due to recent diagnosis of Parkinson's. She is coping mentally very well. No apparent depression. States her strengths are dealing with stresses at that time they occur. Has resolved the loss of her brother 3 years ago. Affect is normal. She is engaged. She is adapting day by day to be any issues due to Parkinson's.  She has adapted her cooking. She has adapted and how  she  cleans her home. She states the stairs at this time are not a problem. Has no plans to leave her residence currently.  We did discuss transportation. Family lives out-of-town and discussed taking the train or other method to visit family. She is not as comfortable traveling alone at this time.  Referrals  discussed DEXA and mammogram Dr. Maudie Mercury recommended DEXA and one was ordered. She will discuss with the patient this am  Patient concerns; The patient voiced no particular concerns today. Denies pain denies stress.  Nurse Concerns; Patient appears to be managing Well.  Next PCP apt Dr. Megan Salon C today to check blood pressure.     I have personally reviewed and noted the following in the patient's chart:   . Medical and social history . Use of alcohol, tobacco or illicit drugs  . Current medications and supplements . Functional ability and status . Nutritional status . Physical activity . Advanced directives . List of other physicians . Hospitalizations, surgeries, and ER visits in previous 12 months . Vitals . Screenings to include cognitive, depression, and falls . Referrals and appointments  In addition, I have reviewed and discussed with patient certain preventive protocols, quality metrics, and best practice recommendations. A written personalized care plan for preventive services as well as general preventive health recommendations were provided to patient.     Wynetta Fines, RN  05/04/2017   Lucretia Kern., DO

## 2017-05-04 ENCOUNTER — Ambulatory Visit (INDEPENDENT_AMBULATORY_CARE_PROVIDER_SITE_OTHER): Payer: Medicare Other

## 2017-05-04 ENCOUNTER — Encounter: Payer: Self-pay | Admitting: Family Medicine

## 2017-05-04 ENCOUNTER — Ambulatory Visit (INDEPENDENT_AMBULATORY_CARE_PROVIDER_SITE_OTHER): Payer: Medicare Other | Admitting: Family Medicine

## 2017-05-04 VITALS — BP 122/74 | HR 62

## 2017-05-04 VITALS — Ht 65.0 in | Wt 142.0 lb

## 2017-05-04 DIAGNOSIS — I10 Essential (primary) hypertension: Secondary | ICD-10-CM | POA: Diagnosis not present

## 2017-05-04 DIAGNOSIS — Z8781 Personal history of (healed) traumatic fracture: Secondary | ICD-10-CM

## 2017-05-04 DIAGNOSIS — E1159 Type 2 diabetes mellitus with other circulatory complications: Secondary | ICD-10-CM

## 2017-05-04 NOTE — Patient Instructions (Addendum)
Pamela Patton , Thank you for taking time to come for your Medicare Wellness Visit. I appreciate your ongoing commitment to your health goals. Please review the following plan we discussed and let me know if I can assist you in the future.   Will try to complete AD; Given copy  Referred to [facility] for questions [facility] offers free advance directive forms, as well as assistance in completing the forms themselves. For assistance, contact the Spiritual Care Department at [phone], or the Clinical Social Work Department at [phone].   Recommendations for Dexa Scan Female over the age of 55 Man age 35 or older If you broke a bone past the age of 69 Women menopausal age with risk factors (thin frame; smoker; hx of fx ) Post menopausal women under the age of 81 with risk factors A man age 29 to 68 with risk factors Other: Spine xray that is showing break of bone loss Back pain with possible break Height loss of 1/2 inch or more within one year Total loss in height of 1.5 inches from your original height  Calcium 1246m with Vit D 800u per day; more as directed by physician Strength building exercises discussed; can include walking; housework; small weights or stretch bands; silver sneakers if access to the Y Dexa was ordered at a facility of her choice. Dr. KMaudie Mercuryto advise.  Will send email [email or web address]  Dr KMaudie Mercuryfollowing bp today  To schedule mammogram when time  Will send educational material to your email address   These are the goals we discussed: Goals    . patient centered          Get her home more organized Finding time is an issue  Uses her strengths she dev during fall to help her now        This is a list of the screening recommended for you and due dates:  Health Maintenance  Topic Date Due  . Flu Shot  05/03/2017  . Complete foot exam   07/25/2017  . Mammogram  10/18/2017  . Hemoglobin A1C  10/28/2017  . Eye exam for diabetics  12/19/2017   . Colon Cancer Screening  07/28/2021  . Tetanus Vaccine  03/12/2026  . DEXA scan (bone density measurement)  Completed  .  Hepatitis C: One time screening is recommended by Center for Disease Control  (CDC) for  adults born from [birth year]through 1965.   Addressed  . Pneumonia vaccines  Completed    Fall Prevention in the Home Falls can cause injuries and can affect people from all age groups. There are many simple things that you can do to make your home safe and to help prevent falls. What can I do on the outside of my home?  Regularly repair the edges of walkways and driveways and fix any cracks.  Remove high doorway thresholds.  Trim any shrubbery on the main path into your home.  Use bright outdoor lighting.  Clear walkways of debris and clutter, including tools and rocks.  Regularly check that handrails are securely fastened and in good repair. Both sides of any steps should have handrails.  Install guardrails along the edges of any raised decks or porches.  Have leaves, snow, and ice cleared regularly.  Use sand or salt on walkways during winter months.  In the garage, clean up any spills right away, including grease or oil spills. What can I do in the bathroom?  Use night lights.  Install grab bars by the  toilet and in the tub and shower. Do not use towel bars as grab bars.  Use non-skid mats or decals on the floor of the tub or shower.  If you need to sit down while you are in the shower, use a plastic, non-slip stool.  Keep the floor dry. Immediately clean up any water that spills on the floor.  Remove soap buildup in the tub or shower on a regular basis.  Attach bath mats securely with double-sided non-slip rug tape.  Remove throw rugs and other tripping hazards from the floor. What can I do in the bedroom?  Use night lights.  Make sure that a bedside light is easy to reach.  Do not use oversized bedding that drapes onto the floor.  Have a firm chair  that has side arms to use for getting dressed.  Remove throw rugs and other tripping hazards from the floor. What can I do in the kitchen?  Clean up any spills right away.  Avoid walking on wet floors.  Place frequently used items in easy-to-reach places.  If you need to reach for something above you, use a sturdy step stool that has a grab bar.  Keep electrical cables out of the way.  Do not use floor polish or wax that makes floors slippery. If you have to use wax, make sure that it is non-skid floor wax.  Remove throw rugs and other tripping hazards from the floor. What can I do in the stairways?  Do not leave any items on the stairs.  Make sure that there are handrails on both sides of the stairs. Fix handrails that are broken or loose. Make sure that handrails are as long as the stairways.  Check any carpeting to make sure that it is firmly attached to the stairs. Fix any carpet that is loose or worn.  Avoid having throw rugs at the top or bottom of stairways, or secure the rugs with carpet tape to prevent them from moving.  Make sure that you have a light switch at the top of the stairs and the bottom of the stairs. If you do not have them, have them installed. What are some other fall prevention tips?  Wear closed-toe shoes that fit well and support your feet. Wear shoes that have rubber soles or low heels.  When you use a stepladder, make sure that it is completely opened and that the sides are firmly locked. Have someone hold the ladder while you are using it. Do not climb a closed stepladder.  Add color or contrast paint or tape to grab bars and handrails in your home. Place contrasting color strips on the first and last steps.  Use mobility aids as needed, such as canes, walkers, scooters, and crutches.  Turn on lights if it is dark. Replace any light bulbs that burn out.  Set up furniture so that there are clear paths. Keep the furniture in the same spot.  Fix  any uneven floor surfaces.  Choose a carpet design that does not hide the edge of steps of a stairway.  Be aware of any and all pets.  Review your medicines with your healthcare provider. Some medicines can cause dizziness or changes in blood pressure, which increase your risk of falling. Talk with your health care provider about other ways that you can decrease your risk of falls. This may include working with a physical therapist or trainer to improve your strength, balance, and endurance. This information is not intended to  replace advice given to you by your health care provider. Make sure you discuss any questions you have with your health care provider. Document Released: 09/09/2002 Document Revised: 02/16/2016 Document Reviewed: 10/24/2014 Elsevier Interactive Patient Education  2017 [place] Maintenance, Female Adopting a healthy lifestyle and getting preventive care can go a long way to promote health and wellness. Talk with your health care provider about what schedule of regular examinations is right for you. This is a good chance for you to check in with your provider about disease prevention and staying healthy. In between checkups, there are plenty of things you can do on your own. Experts have done a lot of research about which lifestyle changes and preventive measures are most likely to keep you healthy. Ask your health care provider for more information. Weight and diet Eat a healthy diet  Be sure to include plenty of vegetables, fruits, low-fat dairy products, and lean protein.  Do not eat a lot of foods high in solid fats, added sugars, or salt.  Get regular exercise. This is one of the most important things you can do for your health. ? Most adults should exercise for at least 150 minutes each week. The exercise should increase your heart rate and make you sweat (moderate-intensity exercise). ? Most adults should also do strengthening exercises at least  twice a week. This is in addition to the moderate-intensity exercise.  Maintain a healthy weight  Body mass index (BMI) is a measurement that can be used to identify possible weight problems. It estimates body fat based on height and weight. Your health care provider can help determine your BMI and help you achieve or maintain a healthy weight.  For females 46 years of age and older: ? A BMI below 18.5 is considered underweight. ? A BMI of 18.5 to 24.9 is normal. ? A BMI of 25 to 29.9 is considered overweight. ? A BMI of 30 and above is considered obese.  Watch levels of cholesterol and blood lipids  You should start having your blood tested for lipids and cholesterol at 73 years of age, then have this test every 5 years.  You may need to have your cholesterol levels checked more often if: ? Your lipid or cholesterol levels are high. ? You are older than 73 years of age. ? You are at high risk for heart disease.  Cancer screening Lung Cancer  Lung cancer screening is recommended for adults 86-64 years old who are at high risk for lung cancer because of a history of smoking.  A yearly low-dose CT scan of the lungs is recommended for people who: ? Currently smoke. ? Have quit within the past 15 years. ? Have at least a 30-pack-year history of smoking. A pack year is smoking an average of one pack of cigarettes a day for 1 year.  Yearly screening should continue until it has been 15 years since you quit.  Yearly screening should stop if you develop a health problem that would prevent you from having lung cancer treatment.  Breast Cancer  Practice breast self-awareness. This means understanding how your breasts normally appear and feel.  It also means doing regular breast self-exams. Let your health care provider know about any changes, no matter how small.  If you are in your 20s or 30s, you should have a clinical breast exam (CBE) by a health care provider every 1-3 years as  part of a regular health exam.  If you are  63 or older, have a CBE every year. Also consider having a breast X-ray (mammogram) every year.  If you have a family history of breast cancer, talk to your health care provider about genetic screening.  If you are at high risk for breast cancer, talk to your health care provider about having an MRI and a mammogram every year.  Breast cancer gene (BRCA) assessment is recommended for women who have family members with BRCA-related cancers. BRCA-related cancers include: ? Breast. ? Ovarian. ? Tubal. ? Peritoneal cancers.  Results of the assessment will determine the need for genetic counseling and BRCA1 and BRCA2 testing.  Cervical Cancer Your health care provider may recommend that you be screened regularly for cancer of the pelvic organs (ovaries, uterus, and vagina). This screening involves a pelvic examination, including checking for microscopic changes to the surface of your cervix (Pap test). You may be encouraged to have this screening done every 3 years, beginning at age 38.  For women ages 1-65, health care providers may recommend pelvic exams and Pap testing every 3 years, or they may recommend the Pap and pelvic exam, combined with testing for human papilloma virus (HPV), every 5 years. Some types of HPV increase your risk of cervical cancer. Testing for HPV may also be done on women of any age with unclear Pap test results.  Other health care providers may not recommend any screening for nonpregnant women who are considered low risk for pelvic cancer and who do not have symptoms. Ask your health care provider if a screening pelvic exam is right for you.  If you have had past treatment for cervical cancer or a condition that could lead to cancer, you need Pap tests and screening for cancer for at least 20 years after your treatment. If Pap tests have been discontinued, your risk factors (such as having a new sexual partner) need to be  reassessed to determine if screening should resume. Some women have medical problems that increase the chance of getting cervical cancer. In these cases, your health care provider may recommend more frequent screening and Pap tests.  Colorectal Cancer  This type of cancer can be detected and often prevented.  Routine colorectal cancer screening usually begins at 73 years of age and continues through 73 years of age.  Your health care provider may recommend screening at an earlier age if you have risk factors for colon cancer.  Your health care provider may also recommend using home test kits to check for hidden blood in the stool.  A small camera at the end of a tube can be used to examine your colon directly (sigmoidoscopy or colonoscopy). This is done to check for the earliest forms of colorectal cancer.  Routine screening usually begins at age 37.  Direct examination of the colon should be repeated every 5-10 years through 73 years of age. However, you may need to be screened more often if early forms of precancerous polyps or small growths are found.  Skin Cancer  Check your skin from head to toe regularly.  Tell your health care provider about any new moles or changes in moles, especially if there is a change in a mole's shape or color.  Also tell your health care provider if you have a mole that is larger than the size of a pencil eraser.  Always use sunscreen. Apply sunscreen liberally and repeatedly throughout the day.  Protect yourself by wearing long sleeves, pants, a wide-brimmed hat, and sunglasses whenever you are  outside.  Heart disease, diabetes, and high blood pressure  High blood pressure causes heart disease and increases the risk of stroke. High blood pressure is more likely to develop in: ? People who have blood pressure in the high end of the normal range (130-139/85-89 mm Hg). ? People who are overweight or obese. ? People who are African American.  If you  are 48-52 years of age, have your blood pressure checked every 3-5 years. If you are 53 years of age or older, have your blood pressure checked every year. You should have your blood pressure measured twice-once when you are at a hospital or clinic, and once when you are not at a hospital or clinic. Record the average of the two measurements. To check your blood pressure when you are not at a hospital or clinic, you can use: ? An automated blood pressure machine at a pharmacy. ? A home blood pressure monitor.  If you are between 63 years and 63 years old, ask your health care provider if you should take aspirin to prevent strokes.  Have regular diabetes screenings. This involves taking a blood sample to check your fasting blood sugar level. ? If you are at a normal weight and have a low risk for diabetes, have this test once every three years after 73 years of age. ? If you are overweight and have a high risk for diabetes, consider being tested at a younger age or more often. Preventing infection Hepatitis B  If you have a higher risk for hepatitis B, you should be screened for this virus. You are considered at high risk for hepatitis B if: ? You were born in a country where hepatitis B is common. Ask your health care provider which countries are considered high risk. ? Your parents were born in a high-risk country, and you have not been immunized against hepatitis B (hepatitis B vaccine). ? You have HIV or AIDS. ? You use needles to inject street drugs. ? You live with someone who has hepatitis B. ? You have had sex with someone who has hepatitis B. ? You get hemodialysis treatment. ? You take certain medicines for conditions, including cancer, organ transplantation, and autoimmune conditions.  Hepatitis C  Blood testing is recommended for: ? Everyone born from [birth year] through 1967. ? Anyone with known risk factors for hepatitis C.  Sexually transmitted infections (STIs)  You should be  screened for sexually transmitted infections (STIs) including gonorrhea and chlamydia if: ? You are sexually active and are younger than 73 years of age. ? You are older than 73 years of age and your health care provider tells you that you are at risk for this type of infection. ? Your sexual activity has changed since you were last screened and you are at an increased risk for chlamydia or gonorrhea. Ask your health care provider if you are at risk.  If you do not have HIV, but are at risk, it may be recommended that you take a prescription medicine daily to prevent HIV infection. This is called pre-exposure prophylaxis (PrEP). You are considered at risk if: ? You are sexually active and do not regularly use condoms or know the HIV status of your partner(s). ? You take drugs by injection. ? You are sexually active with a partner who has HIV.  Talk with your health care provider about whether you are at high risk of being infected with HIV. If you choose to begin PrEP, you should first be tested  for HIV. You should then be tested every 3 months for as long as you are taking PrEP. Pregnancy  If you are premenopausal and you may become pregnant, ask your health care provider about preconception counseling.  If you may become pregnant, take 400 to 800 micrograms (mcg) of folic acid every day.  If you want to prevent pregnancy, talk to your health care provider about birth control (contraception). Osteoporosis and menopause  Osteoporosis is a disease in which the bones lose minerals and strength with aging. This can result in serious bone fractures. Your risk for osteoporosis can be identified using a bone density scan.  If you are 15 years of age or older, or if you are at risk for osteoporosis and fractures, ask your health care provider if you should be screened.  Ask your health care provider whether you should take a calcium or vitamin D supplement to lower your risk for  osteoporosis.  Menopause may have certain physical symptoms and risks.  Hormone replacement therapy may reduce some of these symptoms and risks. Talk to your health care provider about whether hormone replacement therapy is right for you. Follow these instructions at home:  Schedule regular health, dental, and eye exams.  Stay current with your immunizations.  Do not use any tobacco products including cigarettes, chewing tobacco, or electronic cigarettes.  If you are pregnant, do not drink alcohol.  If you are breastfeeding, limit how much and how often you drink alcohol.  Limit alcohol intake to no more than 1 drink per day for nonpregnant women. One drink equals 12 ounces of beer, 5 ounces of wine, or 1 ounces of hard liquor.  Do not use street drugs.  Do not share needles.  Ask your health care provider for help if you need support or information about quitting drugs.  Tell your health care provider if you often feel depressed.  Tell your health care provider if you have ever been abused or do not feel safe at home. This information is not intended to replace advice given to you by your health care provider. Make sure you discuss any questions you have with your health care provider. Document Released: 04/04/2011 Document Revised: 02/25/2016 Document Reviewed: 06/23/2015 Elsevier Interactive Patient Education  [employer].

## 2017-05-04 NOTE — Addendum Note (Signed)
Addended by: Lucretia Kern on: 05/04/2017 10:17 AM   Modules accepted: Level of Service

## 2017-05-04 NOTE — Progress Notes (Signed)
HPI:   Follow up blood pressure. Pamela Patton for her Medicare Wellness visit today.  Stopped norvasc at her last visit as blood pressure was running low. Had already weaned off of atenolol.  She reports she is doing well and feel better off of the norvasc.no CP, SOB, DOE.  ROS: See pertinent positives and negatives per HPI.  Past Medical History:  Diagnosis Date  . Anemia   . Arthritis   . Cataract    immature   . Chest pain summer 2012  . Chicken pox   . Diverticulosis    difficulty with colonoscopy 2005  . 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 Elyse Hsu 10/08/2012  . Fibroid    fibroid tumor  . Fracture    radial/ulnar 03/2016  . GERD (gastroesophageal reflux disease)   . Heart murmur    innocent murmur  . High cholesterol   . Hypertension   . Hypothyroid, s/p radioactive iodine tx for graves 2001 08/24/2012  . Intestinal metaplasia of gastric mucosa   . Kidney disease   . PD (Parkinson's disease) ([place]) 08/08/2016  . Renal cancer ([place])   . UTI (urinary tract infection)   . Weight loss    eval with GI in 2016; s/p CT/MRI and PET scans    Past Surgical History:  Procedure Laterality Date  . ABDOMINAL HYSTERECTOMY  1989  . COLONOSCOPY    . DG BARIUM SWALLOW ([place] [facility])     9 yrs ago per pt due to incomplete colonsocopy    Family History  Problem Relation Age of Onset  . Cancer Mother        liver  . Liver cancer Mother   . Stroke Father 29  . Diabetes Father   . Hypertension Father   . Diabetes Maternal Grandmother   . Prostate cancer Maternal Grandfather   . Pancreatic cancer Brother   . Diabetes Sister   . Cancer Sister   . Diabetes Sister   . Colon polyps Neg Hx   . Colon cancer Neg Hx   . Esophageal cancer Neg Hx   . Rectal cancer Neg Hx   . Stomach cancer Neg Hx     Social History   Social History  . Marital status: Single    Spouse name: N/A  . Number of children: N/A  . Years of education: N/A    Occupational History  . retired     taught at [employer], histology, anatomy and physiology   Social History Main Topics  . Smoking status: Never Smoker  . Smokeless tobacco: Never Used  . Alcohol use No  . Drug use: No  . Sexual activity: Not Asked   Other Topics Concern  . 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  aspirin 81 MG tablet, Take 81 mg by mouth daily., Disp: , Rfl:  .  atorvastatin (LIPITOR) 40 MG tablet, Take 40 mg by mouth daily., Disp: , Rfl:  .  Calcium Carbonate-Vitamin D (CALTRATE 600+D) 600-400  MG-UNIT per tablet, Take 1 tablet by mouth daily., Disp: , Rfl:  .  carbidopa-levodopa (SINEMET IR) 25-100 MG tablet, Take 1 tablet by mouth 3 (three) times daily., Disp: 270 tablet, Rfl: 1 .  HUMALOG KWIKPEN 100 UNIT/ML KiwkPen, , Disp: , Rfl:  .  hydrochlorothiazide (HYDRODIURIL) 25 MG tablet, Take 25 mg by mouth daily. , Disp: , Rfl:  .  insulin glargine (LANTUS) 100 UNIT/ML injection, Inject 25 Units into the skin at bedtime. , Disp: , Rfl:  .  levothyroxine (SYNTHROID, LEVOTHROID) 75 MCG tablet, Take 75 mcg by mouth daily., Disp: , Rfl:  .  losartan (COZAAR) 50 MG tablet, TAKE 1 TABLET BY MOUTH DAILY, Disp: 90 tablet, Rfl: 1 .  METFORMIN HCL PO, Take 500 mg by mouth 2 (two) times daily. , Disp: , Rfl:  .  Multiple Vitamins-Minerals (PRESERVISION AREDS PO), Take by mouth., Disp: , Rfl:  .  Pioglitazone HCl (ACTOS PO), Take 45 mg by mouth daily. , Disp: , Rfl:   Current Facility-Administered Medications:  .  0.9 %  sodium chloride infusion, 500 mL, Intravenous, Continuous, Armbruster, Renelda Loma,  MD  EXAM:  Vitals:   05/04/17 0957  BP: 122/74  Pulse: 62    There is no height or weight on file to calculate BMI.  GENERAL: vitals reviewed and listed above, alert, oriented, appears well hydrated and in no acute distress  HEENT: atraumatic, conjunttiva clear, no obvious abnormalities on inspection of external nose and ears  NECK: no obvious masses on inspection  LUNGS: clear to auscultation bilaterally, no wheezes, rales or rhonchi, good air movement  CV: HRRR, no peripheral edema  MS: moves all extremities without noticeable abnormality  PSYCH: pleasant and cooperative, no obvious depression or anxiety  ASSESSMENT AND PLAN:  Discussed the following assessment and plan:  Hypertension associated with diabetes ([place])  -continue off of norvasc -assistant to abstract labs from endocrine -Pamela Patton will order Dexa -follow up 3-4 months -Patient advised to return or notify a doctor immediately if symptoms worsen or persist or new concerns arise.  There are no Patient Instructions on file for this visit.  Colin Benton R., DO

## 2017-05-09 ENCOUNTER — Ambulatory Visit: Payer: Medicare Other

## 2017-05-09 ENCOUNTER — Ambulatory Visit: Payer: Medicare Other | Attending: Neurology | Admitting: Occupational Therapy

## 2017-05-09 DIAGNOSIS — R29898 Other symptoms and signs involving the musculoskeletal system: Secondary | ICD-10-CM | POA: Diagnosis present

## 2017-05-09 DIAGNOSIS — R2681 Unsteadiness on feet: Secondary | ICD-10-CM

## 2017-05-09 DIAGNOSIS — R471 Dysarthria and anarthria: Secondary | ICD-10-CM

## 2017-05-09 DIAGNOSIS — R29818 Other symptoms and signs involving the nervous system: Secondary | ICD-10-CM

## 2017-05-09 DIAGNOSIS — R1310 Dysphagia, unspecified: Secondary | ICD-10-CM

## 2017-05-09 DIAGNOSIS — R2689 Other abnormalities of gait and mobility: Secondary | ICD-10-CM

## 2017-05-09 DIAGNOSIS — R293 Abnormal posture: Secondary | ICD-10-CM | POA: Diagnosis present

## 2017-05-09 DIAGNOSIS — R278 Other lack of coordination: Secondary | ICD-10-CM

## 2017-05-09 NOTE — Patient Instructions (Signed)
Do 4-5 reps loud /a/, 2-3 times per day.

## 2017-05-09 NOTE — Therapy (Signed)
[facility] [address] [place], [place], [address] Phone: [phone]   Fax:  [phone]  Occupational Therapy Treatment  Patient Details  Name: Pamela Patton MRN: [id number] Date of Birth: 1946/01/30 Referring Provider: Dr. Carles Collet  Encounter Date: 05/09/2017      OT End of Session - 05/09/17 0949    Visit Number 2   Number of Visits 11   Date for OT Re-Evaluation 06/08/17   Authorization Type UHC MCR   Authorization Time Period 60 days 7/17/.18-9/15/18   Authorization - Visit Number 2   Authorization - Number of Visits 10   OT Start Time 0940   OT Stop Time 1018   OT Time Calculation (min) 38 min   Activity Tolerance Patient tolerated treatment well   Behavior During Therapy [facility] for tasks assessed/performed      Past Medical History:  Diagnosis Date  . Anemia   . Arthritis   . Cataract    immature   . Chest pain summer 2012  . Chicken pox   . Diverticulosis    difficulty with colonoscopy 2005  . 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 Elyse Hsu 10/08/2012  . Fibroid    fibroid tumor  . Fracture    radial/ulnar 03/2016  . GERD (gastroesophageal reflux disease)   . Heart murmur    innocent murmur  . High cholesterol   . Hypertension   . Hypothyroid, s/p radioactive iodine tx for graves 2001 08/24/2012  . Intestinal metaplasia of gastric mucosa   . Kidney disease   . PD (Parkinson's disease) ([place]) 08/08/2016  . Renal cancer ([place])   . UTI (urinary tract infection)   . Weight loss    eval with GI in 2016; s/p CT/MRI and PET scans    Past Surgical History:  Procedure Laterality Date  . ABDOMINAL HYSTERECTOMY  1989  . COLONOSCOPY    . DG BARIUM SWALLOW ([place] HX)     9 yrs ago per pt due to incomplete colonsocopy    There were no vitals filed for this visit.      Subjective Assessment - 05/09/17 0948    Subjective  Pt reports difficulty clapping     Pertinent History PD, left wrist fx   Patient Stated Goals coordination and strength in her hands   Currently in Pain? No/denies        PWR! Moves (basic 4) in supine and sitting x 10-20 each with min cues For incr movement amplitude/technique.  Reviewed strategies for bed mobility using large amplitude movements and pt returned demo after review.  Also reviewed use of PWR! Step for car transfers and LB dressing.  Pt verbalized understanding.  Education regarding use of large amplitude movement strategies including trunk/head rotation and eye movements during HEP to prevent future complications for decr neck rigidity for driving and reduce risk for shoulder pain.  Pt verbalized understanding.  Practiced clapping (for church) for large amplitude movements and pt demo good success with cueing.                       OT Short Term Goals - 04/19/17 0830      OT SHORT TERM GOAL #3   Status --     OT SHORT TERM GOAL #4   Status --     OT SHORT TERM GOAL #5   Status --     OT SHORT TERM GOAL #6   Status --  OT Long Term Goals - 04/19/17 0817      OT LONG TERM GOAL #1   Title Pt will verbalize understanding of ways to prevent future PD related complications and verbalize understanding of community resources.   Time 5   Period Weeks   Status New     OT LONG TERM GOAL #2   Title I with PD specific HEP following review   Time 5   Period Weeks   Status New     OT LONG TERM GOAL #3   Title Pt will demonstrate improved ease with dressing as evidenced by decreasing PPT# 4 (don/ doff jacket) to 36 secs or less.   Baseline 40.16   Time 5   Period Weeks   Status New     OT LONG TERM GOAL #4   Title Pt will verbalize understanding of adapted strategies for ADLS/IADLS in order to increase safety and independence.   Time 5   Period Weeks   Status New     OT LONG TERM GOAL #5   Title Pt willdemonstrate increased ease with feeding as evidenced by  decreasing PPT#2 (simulated feeding) to 16 secs or less.   Baseline 18.60 secs   Time 5   Period Weeks   Status New               Plan - 05/09/17 0951    Rehab Potential Good   OT Frequency 2x / week   OT Duration --  5 weeks   OT Treatment/Interventions Self-care/ADL training;Therapeutic exercise;Cognitive remediation/compensation;Visual/perceptual remediation/compensation;Splinting;Neuromuscular education;Moist Heat;Parrafin;Fluidtherapy;Energy conservation;Therapeutic exercises;Patient/family education;Balance training;Therapeutic activities;Passive range of motion;Manual Therapy;DME and/or AE instruction;Contrast Bath;Ultrasound;Cryotherapy   Plan adaptive strategies for ADLs/IADLs   Consulted and Agree with Plan of Care Patient      Patient will benefit from skilled therapeutic intervention in order to improve the following deficits and impairments:     Visit Diagnosis: Other symptoms and signs involving the nervous system  Other symptoms and signs involving the musculoskeletal system  Other lack of coordination  Unsteadiness on feet  Abnormal posture  Other abnormalities of gait and mobility    Problem List Patient Active Problem List   Diagnosis Date Noted  . 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 Carotid artery disease ([place]) 03/21/2017  . Hypertension associated with diabetes ([place]) 02/22/2017  . Hyperlipidemia associated with type 2 diabetes mellitus ([place]) 02/22/2017  . PD (Parkinson's disease) ([place]) 08/08/2016  . Loss of weight 07/25/2016  . 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 Elyse Hsu 10/08/2012  . CKD (chronic kidney disease) stage 3, GFR 30-59 ml/min 10/08/2012  . Hypothyroid, s/p radioactive iodine tx for graves 2001 - managed by endocrinologist, Dr. Elyse Hsu 08/24/2012  . Vitamin D deficiency 08/24/2012    [facility] 05/09/2017, 9:53 AM  [facility] [address] [place], [place], [address] Phone: [phone]   Fax:  [phone]  Name: Pamela Patton MRN: [id number] Date of Birth: [birth year]   Vianne Bulls, OTR/L [facility] [address] [place], [place]  [address] [phone] phone [phone] 05/09/17 10:33 AM

## 2017-05-09 NOTE — Patient Instructions (Signed)
Stretch legs by placing feet in chair and try to straighten knees until you get a stretch (may also need to lean forward).  30sec, 3x/day

## 2017-05-09 NOTE — Therapy (Signed)
[facility] [address] [place], [place], [address] Phone: [phone]   Fax:  [phone]  Speech Language Pathology Treatment  Patient Details  Name: Pamela Patton MRN: [id number] Date of Birth: 02/29/1946 Referring Provider: [name], DO  Encounter Date: 05/09/2017      End of Session - 05/09/17 1106    Visit Number 2   Number of Visits 17   Date for SLP Re-Evaluation 06/26/17   SLP Start Time 1024  pt 8 minutes late from OT   SLP Stop Time  6834   SLP Time Calculation (min) 38 min   Activity Tolerance Patient tolerated treatment well      Past Medical History:  Diagnosis Date  . Anemia   . Arthritis   . Cataract    immature   . Chest pain summer 2012  . Chicken pox   . Diverticulosis    difficulty with colonoscopy 2005  . 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 Elyse Hsu 10/08/2012  . Fibroid    fibroid tumor  . Fracture    radial/ulnar 03/2016  . GERD (gastroesophageal reflux disease)   . Heart murmur    innocent murmur  . High cholesterol   . Hypertension   . Hypothyroid, s/p radioactive iodine tx for graves 2001 08/24/2012  . Intestinal metaplasia of gastric mucosa   . Kidney disease   . PD (Parkinson's disease) ([place]) 08/08/2016  . Renal cancer ([place])   . UTI (urinary tract infection)   . Weight loss    eval with GI in 2016; s/p CT/MRI and PET scans    Past Surgical History:  Procedure Laterality Date  . ABDOMINAL HYSTERECTOMY  1989  . COLONOSCOPY    . DG BARIUM SWALLOW ([place] HX)     9 yrs ago per pt due to incomplete colonsocopy    There were no vitals filed for this visit.      Subjective Assessment - 05/09/17 1034    Subjective Pt maintained WNL loudness with sound level meter in front of her for 15 minutes.   Currently in Pain? No/denies               ADULT SLP TREATMENT - 05/09/17 1045      General Information   Behavior/Cognition Alert;Cooperative;Pleasant mood     Treatment Provided   Treatment provided Cognitive-Linquistic     Cognitive-Linquistic Treatment   Treatment focused on Dysarthria   Skilled Treatment Pt reports she has been approx 80% compliant with loud /a/ practice. Pt maintained WNL loudness speech for 20 minutes in mod complex conversation before volume degraded to average upper 60s dB. With one questioning cue, pt incr'd volume again for another 10 mintues.  Loud /a/ was shaped from a dynamic high to low volume and high to low pitch, to /a/ with static volume and pitch, in order to train muscle memory for habitual louder convesational volume; pt average today low to mid 90s dB.      Assessment / Recommendations / Plan   Plan Continue with current plan of care     Progression Toward Goals   Progression toward goals Progressing toward goals            SLP Short Term Goals - 05/09/17 1330      SLP SHORT TERM GOAL #1   Title pt will demo appropriate breath support in 19/20 sentence responses over three sessions   Time 4   Period Weeks  Status New     SLP SHORT TERM GOAL #2   Title pt will respond to structured speech tasks with sentences in a noisy environment with 100% intelligibility over three sessions   Time 4   Period Weeks   Status New     SLP SHORT TERM GOAL #3   Title pt will demo speech at low 70s dB average in 20 minutes mod complex conversation over three sessions   Time 4   Period Weeks   Status New          SLP Long Term Goals - 04/18/17 1736      SLP LONG TERM GOAL #1   Title pt will produce 15 minutes mod complex conversational speech with average low 70s dB over three sessions   Time 8   Period Weeks   Status New     SLP LONG TERM GOAL #2   Title pt will demo appropriate abdominal breath support necessary for intelligible speech in 20 minutes conversation   Time 8   Period Weeks   Status New     SLP LONG TERM GOAL #3   Time --    Period --   Status --          Plan - 05/09/17 1326    Clinical Impression Statement Pt cont to present with suboptimal loudness spontaneously. However with SLP as conversation partner and sound level meter in front of her with SLP initial encouragement to incr volume pt did so for 20 minutes. She would cont to benefit from skilled ST to increase conversational loudness and other cognitive linguistic deficits in order to improve everyday conversation in multiple communicaitve contexts.   Speech Therapy Frequency 2x / week   Duration --  8 weeks   Treatment/Interventions Aspiration precaution training;Pharyngeal strengthening exercises;Diet toleration management by SLP;Cueing hierarchy;Internal/external aids;SLP instruction and feedback;Functional tasks;Patient/family education;Compensatory strategies;Oral motor exercises   Potential to Achieve Goals Good   Consulted and Agree with Plan of Care Patient      Patient will benefit from skilled therapeutic intervention in order to improve the following deficits and impairments:   No diagnosis found.    Problem List Patient Active Problem List   Diagnosis Date Noted  . 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 Carotid artery disease ([place]) 03/21/2017  . Hypertension associated with diabetes ([place]) 02/22/2017  . Hyperlipidemia associated with type 2 diabetes mellitus ([place]) 02/22/2017  . PD (Parkinson's disease) ([place]) 08/08/2016  . Loss of weight 07/25/2016  . 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 Elyse Hsu 10/08/2012  . CKD (chronic kidney disease) stage 3, GFR 30-59 ml/min 10/08/2012  . Hypothyroid, s/p radioactive iodine tx for graves 2001 - managed by endocrinologist, Dr. Elyse Hsu 08/24/2012  . Vitamin D deficiency 08/24/2012    [facility] ,MS, CCC-SLP  05/09/2017, 1:36 PM  [place] [address] [place], [place],  [address] Phone: [phone]   Fax:  [phone]   Name: Pamela Patton MRN: [id number] Date of Birth: 05/17/1946

## 2017-05-09 NOTE — Addendum Note (Signed)
Addended by: Garald Balding B on: 05/09/2017 01:44 PM   Modules accepted: Orders

## 2017-05-11 ENCOUNTER — Ambulatory Visit: Payer: Medicare Other

## 2017-05-11 ENCOUNTER — Ambulatory Visit: Payer: Medicare Other | Admitting: Occupational Therapy

## 2017-05-11 DIAGNOSIS — R29818 Other symptoms and signs involving the nervous system: Secondary | ICD-10-CM

## 2017-05-11 DIAGNOSIS — R278 Other lack of coordination: Secondary | ICD-10-CM

## 2017-05-11 DIAGNOSIS — R1310 Dysphagia, unspecified: Secondary | ICD-10-CM

## 2017-05-11 DIAGNOSIS — R29898 Other symptoms and signs involving the musculoskeletal system: Secondary | ICD-10-CM

## 2017-05-11 DIAGNOSIS — R471 Dysarthria and anarthria: Secondary | ICD-10-CM

## 2017-05-11 DIAGNOSIS — R2681 Unsteadiness on feet: Secondary | ICD-10-CM

## 2017-05-11 NOTE — Therapy (Signed)
[facility] [address] [place], [place], [address] Phone: [phone]   Fax:  [phone]  Occupational Therapy Treatment  Patient Details  Name: Pamela Patton MRN: [id number] Date of Birth: [birth year] Referring Provider: Dr. Carles Collet  Encounter Date: 05/11/2017      OT End of Session - 05/11/17 1539    Visit Number 3   Number of Visits 11   Date for OT Re-Evaluation 06/08/17   Authorization Type UHC MCR   Authorization Time Period 60 days 7/17/.18-9/15/18   Authorization - Visit Number 3   Authorization - Number of Visits 10   OT Start Time 5102   OT Stop Time 1530   OT Time Calculation (min) 39 min   Activity Tolerance Patient tolerated treatment well   Behavior During Therapy [facility] for tasks assessed/performed      Past Medical History:  Diagnosis Date  . Anemia   . Arthritis   . Cataract    immature   . Chest pain summer 2012  . Chicken pox   . Diverticulosis    difficulty with colonoscopy 2005  . 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 Elyse Hsu 10/08/2012  . Fibroid    fibroid tumor  . Fracture    radial/ulnar 03/2016  . GERD (gastroesophageal reflux disease)   . Heart murmur    innocent murmur  . High cholesterol   . Hypertension   . Hypothyroid, s/p radioactive iodine tx for graves 2001 08/24/2012  . Intestinal metaplasia of gastric mucosa   . Kidney disease   . PD (Parkinson's disease) ([place]) 08/08/2016  . Renal cancer ([place])   . UTI (urinary tract infection)   . Weight loss    eval with GI in 2016; s/p CT/MRI and PET scans    Past Surgical History:  Procedure Laterality Date  . ABDOMINAL HYSTERECTOMY  1989  . COLONOSCOPY    . DG BARIUM SWALLOW ([place] HX)     9 yrs ago per pt due to incomplete colonsocopy    There were no vitals filed for this visit.      Subjective Assessment - 05/11/17 1518    Pertinent History PD, left wrist fx    Patient Stated Goals coordination and strength in her hands   Currently in Pain? No/denies             Treatment: PWR! Up, and rock in quadraped, x 10 reps each, pt fatigued very quickly . Therapist transitioned pt to modified quadraped for remaining exercises, 10-20 reps each min v.c for large amplitude movements. Reviewed flipping and dealing playing cards with big movements, mod v.c for LUE positioning.                      OT Long Term Goals - 04/19/17 0817      OT LONG TERM GOAL #1   Title Pt will verbalize understanding of ways to prevent future PD related complications and verbalize understanding of community resources.   Time 5   Period Weeks   Status New     OT LONG TERM GOAL #2   Title I with PD specific HEP following review   Time 5   Period Weeks   Status New     OT LONG TERM GOAL #3   Title Pt will demonstrate improved ease with dressing as evidenced by decreasing PPT# 4 (don/ doff jacket) to 36 secs or less.   Baseline 40.16  Time 5   Period Weeks   Status New     OT LONG TERM GOAL #4   Title Pt will verbalize understanding of adapted strategies for ADLS/IADLS in order to increase safety and independence.   Time 5   Period Weeks   Status New     OT LONG TERM GOAL #5   Title Pt willdemonstrate increased ease with feeding as evidenced by decreasing PPT#2 (simulated feeding) to 16 secs or less.   Baseline 18.60 secs   Time 5   Period Weeks   Status New               Plan - 05/11/17 1527    Clinical Impression Statement Pt is progressing towards goals. She continues to require v.c for finger extension/ big movments with LUE.   Rehab Potential Good   OT Frequency 2x / week   OT Duration 8 weeks   OT Treatment/Interventions Self-care/ADL training;Therapeutic exercise;Cognitive remediation/compensation;Visual/perceptual remediation/compensation;Splinting;Neuromuscular education;Moist Heat;Parrafin;Fluidtherapy;Energy  conservation;Therapeutic exercises;Patient/family education;Balance training;Therapeutic activities;Passive range of motion;Manual Therapy;DME and/or AE instruction;Contrast Bath;Ultrasound;Cryotherapy   Plan adaptive strategies for ADLs   OT Home Exercise Plan Reviewed PWR! seated and supine   Consulted and Agree with Plan of Care Patient      Patient will benefit from skilled therapeutic intervention in order to improve the following deficits and impairments:  Abnormal gait, Decreased cognition, Impaired vision/preception, Impaired flexibility, Decreased knowledge of use of DME, Decreased coordination, Decreased mobility, Impaired tone, Decreased strength, Decreased range of motion, Decreased endurance, Decreased activity tolerance, Decreased balance, Decreased knowledge of precautions, Difficulty walking, Impaired perceived functional ability, Impaired UE functional use  Visit Diagnosis: Other symptoms and signs involving the nervous system  Other symptoms and signs involving the musculoskeletal system  Other lack of coordination  Unsteadiness on feet    Problem List Patient Active Problem List   Diagnosis Date Noted  . 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 Carotid artery disease ([place]) 03/21/2017  . Hypertension associated with diabetes ([place]) 02/22/2017  . Hyperlipidemia associated with type 2 diabetes mellitus ([place]) 02/22/2017  . PD (Parkinson's disease) ([place]) 08/08/2016  . Loss of weight 07/25/2016  . 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 Elyse Hsu 10/08/2012  . CKD (chronic kidney disease) stage 3, GFR 30-59 ml/min 10/08/2012  . Hypothyroid, s/p radioactive iodine tx for graves 2001 - managed by endocrinologist, Dr. Elyse Hsu 08/24/2012  . Vitamin D deficiency 08/24/2012    Breck Hollinger 05/11/2017, 3:41 PM  [facility] [address] [place], [place],  [address] Phone: [phone]   Fax:  [phone]  Name: Pamela Patton MRN: [id number] Date of Birth: 01/21/1946

## 2017-05-12 NOTE — Therapy (Signed)
[facility] [address] [place], [place], [address] Phone: [phone]   Fax:  [phone]  Speech Language Pathology Treatment  Patient Details  Name: Pamela Patton MRN: [id number] Date of Birth: 07/11/1946 Referring Provider: [name], DO  Encounter Date: 05/11/2017      End of Session - 05/11/17 1446    Visit Number 3   Number of Visits 17   Date for SLP Re-Evaluation 06/26/17   SLP Start Time 3419   SLP Stop Time  1446   SLP Time Calculation (min) 43 min   Activity Tolerance Patient tolerated treatment well      Past Medical History:  Diagnosis Date  . Anemia   . Arthritis   . Cataract    immature   . Chest pain summer 2012  . Chicken pox   . Diverticulosis    difficulty with colonoscopy 2005  . 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 Elyse Hsu 10/08/2012  . Fibroid    fibroid tumor  . Fracture    radial/ulnar 03/2016  . GERD (gastroesophageal reflux disease)   . Heart murmur    innocent murmur  . High cholesterol   . Hypertension   . Hypothyroid, s/p radioactive iodine tx for graves 2001 08/24/2012  . Intestinal metaplasia of gastric mucosa   . Kidney disease   . PD (Parkinson's disease) ([place]) 08/08/2016  . Renal cancer ([place])   . UTI (urinary tract infection)   . Weight loss    eval with GI in 2016; s/p CT/MRI and PET scans    Past Surgical History:  Procedure Laterality Date  . ABDOMINAL HYSTERECTOMY  1989  . COLONOSCOPY    . DG BARIUM SWALLOW ([place] HX)     9 yrs ago per pt due to incomplete colonsocopy    There were no vitals filed for this visit.      Subjective Assessment - 05/11/17 1412    Subjective Pt again maintained WNL loudness with one reminder for 15 minutes.   Currently in Pain? No/denies               ADULT SLP TREATMENT - 05/12/17 0001      General Information   Behavior/Cognition  Alert;Cooperative;Pleasant mood     Treatment Provided   Treatment provided Cognitive-Linquistic     Cognitive-Linquistic Treatment   Treatment focused on Dysarthria   Skilled Treatment Pt reports she has again been approx 80% compliant with loud /a/ practice. Pt maintained WNL loudness speech for 30 minutes in mod complex conversation. Volume did not degrade over the conversation today.      Assessment / Recommendations / Plan   Plan Continue with current plan of care     Progression Toward Goals   Progression toward goals Progressing toward goals            SLP Short Term Goals - 05/11/17 1450      SLP SHORT TERM GOAL #1   Title pt will demo appropriate breath support in 19/20 sentence responses over three sessions   Baseline 05-09-17, 05-11-17   Time 4   Period Weeks   Status On-going     SLP SHORT TERM GOAL #2   Title pt will respond to structured speech tasks with sentences in a noisy environment with 100% intelligibility over three sessions   Baseline 05-11-17   Time 4   Period Weeks   Status On-going     SLP SHORT TERM GOAL #  3   Title pt will demo speech at low 70s dB average in 20 minutes mod complex conversation over three sessions   Baseline 05-09-17; 05-11-17   Time 4   Period Weeks   Status On-going          SLP Long Term Goals - 05/11/17 1451      SLP LONG TERM GOAL #1   Title pt will produce 20 minutes mod complex-complex conversational speech with average low 70s dB over three sessions   Time 8   Period Weeks   Status Revised     SLP LONG TERM GOAL #2   Title pt will demo appropriate abdominal breath support necessary for intelligible speech in 20 minutes conversation   Time 8   Period Weeks   Status On-going          Plan - 05/11/17 1446    Clinical Impression Statement Pt presented today with normal loudness spontaneously, for ~30 minutes. Next session SLP will move outside of threapy room to different environment. She would cont to benefit  from skilled ST to increase conversational loudness and other cognitive linguistic deficits in order to improve everyday conversation in multiple communicaitve contexts.   Speech Therapy Frequency 2x / week   Duration --  8 weeks   Treatment/Interventions Aspiration precaution training;Pharyngeal strengthening exercises;Diet toleration management by SLP;Cueing hierarchy;Internal/external aids;SLP instruction and feedback;Functional tasks;Patient/family education;Compensatory strategies;Oral motor exercises   Potential to Achieve Goals Good   Consulted and Agree with Plan of Care Patient      Patient will benefit from skilled therapeutic intervention in order to improve the following deficits and impairments:   Dysarthria and anarthria  Dysphagia, unspecified type    Problem List Patient Active Problem List   Diagnosis Date Noted  . 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 Carotid artery disease ([place]) 03/21/2017  . Hypertension associated with diabetes ([place]) 02/22/2017  . Hyperlipidemia associated with type 2 diabetes mellitus ([place]) 02/22/2017  . PD (Parkinson's disease) ([place]) 08/08/2016  . Loss of weight 07/25/2016  . 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 Elyse Hsu 10/08/2012  . CKD (chronic kidney disease) stage 3, GFR 30-59 ml/min 10/08/2012  . Hypothyroid, s/p radioactive iodine tx for graves 2001 - managed by endocrinologist, Dr. Elyse Hsu 08/24/2012  . Vitamin D deficiency 08/24/2012    [facility] ,MS, CCC-SLP  05/12/2017, 5:09 PM  [place] [address] [place], [place], [address] Phone: [phone]   Fax:  [phone]   Name: Pamela Patton MRN: [id number] Date of Birth: 1946/06/05

## 2017-05-16 ENCOUNTER — Ambulatory Visit: Payer: Medicare Other | Admitting: Speech Pathology

## 2017-05-16 ENCOUNTER — Ambulatory Visit: Payer: Medicare Other | Admitting: Occupational Therapy

## 2017-05-16 ENCOUNTER — Ambulatory Visit: Payer: Medicare Other | Admitting: Physical Therapy

## 2017-05-16 DIAGNOSIS — R2689 Other abnormalities of gait and mobility: Secondary | ICD-10-CM

## 2017-05-16 DIAGNOSIS — R2681 Unsteadiness on feet: Secondary | ICD-10-CM

## 2017-05-16 DIAGNOSIS — R293 Abnormal posture: Secondary | ICD-10-CM

## 2017-05-16 DIAGNOSIS — R278 Other lack of coordination: Secondary | ICD-10-CM

## 2017-05-16 DIAGNOSIS — R471 Dysarthria and anarthria: Secondary | ICD-10-CM

## 2017-05-16 DIAGNOSIS — R1310 Dysphagia, unspecified: Secondary | ICD-10-CM

## 2017-05-16 DIAGNOSIS — R29818 Other symptoms and signs involving the nervous system: Secondary | ICD-10-CM

## 2017-05-16 DIAGNOSIS — R29898 Other symptoms and signs involving the musculoskeletal system: Secondary | ICD-10-CM

## 2017-05-16 NOTE — Therapy (Signed)
[facility] [address] [place], [place], [address] Phone: [phone]   Fax:  [phone]  Physical Therapy Treatment  Patient Details  Name: Pamela Patton MRN: [id number] Date of Birth: May 07, 1946 Referring Provider: [name], DO  Encounter Date: 05/16/2017      PT End of Session - 05/16/17 2109    Visit Number 2   Number of Visits 6   Date for PT Re-Evaluation 06/18/17   Authorization Type UHC Medicare-GCODE every 10th visit   PT Start Time 1022   PT Stop Time 1100   PT Time Calculation (min) 38 min   Activity Tolerance Patient tolerated treatment well   Behavior During Therapy [facility] for tasks assessed/performed      Past Medical History:  Diagnosis Date  . Anemia   . Arthritis   . Cataract    immature   . Chest pain summer 2012  . Chicken pox   . Diverticulosis    difficulty with colonoscopy 2005  . 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 Elyse Hsu 10/08/2012  . Fibroid    fibroid tumor  . Fracture    radial/ulnar 03/2016  . GERD (gastroesophageal reflux disease)   . Heart murmur    innocent murmur  . High cholesterol   . Hypertension   . Hypothyroid, s/p radioactive iodine tx for graves 2001 08/24/2012  . Intestinal metaplasia of gastric mucosa   . Kidney disease   . PD (Parkinson's disease) ([place]) 08/08/2016  . Renal cancer ([place])   . UTI (urinary tract infection)   . Weight loss    eval with GI in 2016; s/p CT/MRI and PET scans    Past Surgical History:  Procedure Laterality Date  . ABDOMINAL HYSTERECTOMY  1989  . COLONOSCOPY    . DG BARIUM SWALLOW ([place] HX)     9 yrs ago per pt due to incomplete colonsocopy    There were no vitals filed for this visit.      Subjective Assessment - 05/16/17 1019    Subjective Not as good as I have been, so I'm catching up again.  I've been so busy with other things.  Whenever I would start back  exercises, "life would get busy", so I didn't do them regularly.   Pertinent History DM, fracture L ulna/radius 03/2016, GERD, HTN   Patient Stated Goals Pt's goal for therapy is to work on improved coordination.   Currently in Pain? No/denies   Pain Onset Efrain Sella Adult PT Treatment/Exercise - 05/16/17 1059      Ambulation/Gait   Ambulation/Gait Yes   Ambulation/Gait Assistance 7: Independent   Ambulation Distance (Feet) 520 Feet   Assistive device None   Gait Pattern Step-through pattern;Decreased arm swing - right;Decreased arm swing - left;Decreased stride length;Right flexed knee in stance;Left flexed knee in stance   Ambulation Surface Level;Indoor     Self-Care   Self-Care Other Self-Care Comments   Other Self-Care Comments  Discussed use of exercise chart as a means of organizing exercises (based on pt's reports that between exercises from HEP, community exercises and other activities, she has a hard time fitting it in).  Discussed use of ex chart to tract PWR Moves as well as cycling, walking for exercise.           PWR [facility]) -  05/16/17 1028    PWR! exercises Moves in standing   PWR! Up x 10 reps   PWR! Rock x 10 reps each side   PWR! Twist x 10 reps each side   PWR Step x 10 reps each side  Side step and turn, each direction/back to center, x 5   Comments Verbal cues provided for optimal technique; initiated as HEP; discussed how each PWR! Move relates to functional activites in daily life             PT Education - 05/16/17 2108    Education provided Yes   Education Details Use of exercise chart for organizing exercises as part of HEP   Person(s) Educated Patient   Methods Explanation;Demonstration;Handout   Comprehension Verbalized understanding             PT Long Term Goals - 05/16/17 2112      PT LONG TERM GOAL #1   Title Pt will be independent with progressive HEP, including walking program, to  address Parkinson's related deficits.     Time 5   Period Weeks   Status New   Target Date 06/18/17  Updated target     PT LONG TERM GOAL #2   Title Pt will improve 3 minute walk test to at least 570 ft in 3 minute walk test for improved gait efficiency.   Time 5   Period Weeks   Status New   Target Date 06/18/17     PT LONG TERM GOAL #3   Title Pt will ambulate at least 1000 ft, indoor/outdoor surfaces, independently for improved dynamic gait.   Time 5   Period Weeks   Status New   Target Date 06/18/17     PT LONG TERM GOAL #4   Title Pt will verbalize plans for continued community fitness upon d/c from PT.   Time 5   Period Weeks   Status New   Target Date 06/18/17               Plan - 05/16/17 2110    Clinical Impression Statement Pt returns today for first visit after eval in July (due to patient's scheduling conflicts for the most part); re-added PWR! Moves in standing to HEP and discussed how to incorporate these movement patterns into daily activities.  Pt overall performs exercises well during PT session, just needs extra time for reinforcement.  Please note updated date on goals.   Rehab Potential Good   PT Frequency 1x / week   PT Duration --  5 weeks plus eval   PT Treatment/Interventions ADLs/Self Care Home Management;Functional mobility training;Gait training;Patient/family education;Neuromuscular re-education;Balance training;Therapeutic exercise;Therapeutic activities   PT Next Visit Plan Review standing PWR! Moves; discuss and initiate walking program; postural activities   Consulted and Agree with Plan of Care Patient      Patient will benefit from skilled therapeutic intervention in order to improve the following deficits and impairments:  Abnormal gait, Decreased balance, Decreased mobility, Difficulty walking, Postural dysfunction  Visit Diagnosis: Abnormal posture  Other abnormalities of gait and mobility     Problem List Patient Active  Problem List   Diagnosis Date Noted  . 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 Carotid artery disease ([place]) 03/21/2017  . Hypertension associated with diabetes ([place]) 02/22/2017  . Hyperlipidemia associated with type 2 diabetes mellitus ([place]) 02/22/2017  . PD (Parkinson's disease) ([place]) 08/08/2016  . Loss of weight 07/25/2016  . DM (diabetes mellitus), type 2 with renal complications managed  by endocrinologist, Dr. Elyse Hsu 10/08/2012  . CKD (chronic kidney disease) stage 3, GFR 30-59 ml/min 10/08/2012  . Hypothyroid, s/p radioactive iodine tx for graves 2001 - managed by endocrinologist, Dr. Elyse Hsu 08/24/2012  . Vitamin D deficiency 08/24/2012    Theon Sobotka W. 05/16/2017, 9:21 PM Frazier Butt., PT  [facility] [address] [place], [place], [address] Phone: [phone]   Fax:  [phone]  Name: Pamela Patton MRN: [id number] Date of Birth: [birth year]

## 2017-05-16 NOTE — Therapy (Signed)
[facility] [address] [place], [place], [address] Phone: [phone]   Fax:  [phone]  Speech Language Pathology Treatment  Patient Details  Name: Pamela Patton MRN: [id number] Date of Birth: 08-17-1946 Referring Provider: [name], DO  Encounter Date: 05/16/2017      End of Session - 05/16/17 1154    Visit Number 4   Number of Visits 17   Date for SLP Re-Evaluation 06/26/17   SLP Start Time 0932   SLP Stop Time  4259   SLP Time Calculation (min) 43 min   Activity Tolerance Patient tolerated treatment well      Past Medical History:  Diagnosis Date  . Anemia   . Arthritis   . Cataract    immature   . Chest pain summer 2012  . Chicken pox   . Diverticulosis    difficulty with colonoscopy 2005  . 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 Elyse Hsu 10/08/2012  . Fibroid    fibroid tumor  . Fracture    radial/ulnar 03/2016  . GERD (gastroesophageal reflux disease)   . Heart murmur    innocent murmur  . High cholesterol   . Hypertension   . Hypothyroid, s/p radioactive iodine tx for graves 2001 08/24/2012  . Intestinal metaplasia of gastric mucosa   . Kidney disease   . PD (Parkinson's disease) ([place]) 08/08/2016  . Renal cancer ([place])   . UTI (urinary tract infection)   . Weight loss    eval with GI in 2016; s/p CT/MRI and PET scans    Past Surgical History:  Procedure Laterality Date  . ABDOMINAL HYSTERECTOMY  1989  . COLONOSCOPY    . DG BARIUM SWALLOW ([place] HX)     9 yrs ago per pt due to incomplete colonsocopy    There were no vitals filed for this visit.      Subjective Assessment - 05/16/17 0934    Subjective "No one has been asking me to repeat myself   Currently in Pain? No/denies               ADULT SLP TREATMENT - 05/16/17 0935      General Information   Behavior/Cognition Alert;Cooperative;Pleasant mood     Treatment  Provided   Treatment provided Cognitive-Linquistic     Cognitive-Linquistic Treatment   Treatment focused on Dysarthria   Skilled Treatment Loud /a/ to recalibrate loudness average of 93dB with mod I. Pt with average of  70dB in conversation over 15 minutes. 20 minute walk outside in noisy traffic and  construction - pt 100% intellgible with adequate modification of volume over environmental noise.           SLP Education - 05/16/17 1146    Education provided Yes   Education Details Practice reading aloud for volume, prosody and facial expression   Person(s) Educated Patient   Methods Explanation;Demonstration;Handout   Comprehension Verbalized understanding;Returned demonstration;Verbal cues required          SLP Short Term Goals - 05/16/17 1153      SLP SHORT TERM GOAL #1   Title pt will demo appropriate breath support in 19/20 sentence responses over three sessions   Baseline 05-09-17, 05-11-17, 05/16/17   Time 4   Period Weeks   Status Achieved     SLP SHORT TERM GOAL #2   Title pt will respond to structured speech tasks with sentences in a noisy environment with 100% intelligibility over three  sessions   Baseline 05-11-17, 05/16/17   Time 4   Period Weeks   Status On-going     SLP SHORT TERM GOAL #3   Title pt will demo speech at low 70s dB average in 20 minutes mod complex conversation over three sessions   Baseline 05-09-17; 05-11-17, 05/16/17   Time 4   Period Weeks   Status Achieved          SLP Long Term Goals - 05/16/17 1154      SLP LONG TERM GOAL #1   Title pt will produce 20 minutes mod complex-complex conversational speech with average low 70s dB over three sessions   Time 8   Period Weeks   Status Revised     SLP LONG TERM GOAL #2   Title pt will demo appropriate abdominal breath support necessary for intelligible speech in 20 minutes conversation   Time 8   Period Weeks   Status On-going          Plan - 05/16/17 1147    Clinical Impression  Statement Pt demonstrated adequate volume and good intellgilbity walking outside of building in construction and traffic noise with rare min A for 20 minutes. She adjusted volume appropriately for quieter and loud environments. Pt required cues to attend to/utilize arm swing and walk heel toe while she was focusing on volume. Continue skilled ST to maximize carryover of compensations for intelligiblity in a variety of environments and cognitive demands.    Speech Therapy Frequency 2x / week   Treatment/Interventions Aspiration precaution training;Pharyngeal strengthening exercises;Diet toleration management by SLP;Cueing hierarchy;Internal/external aids;SLP instruction and feedback;Functional tasks;Patient/family education;Compensatory strategies;Oral motor exercises      Patient will benefit from skilled therapeutic intervention in order to improve the following deficits and impairments:   Dysarthria and anarthria  Dysphagia, unspecified type    Problem List Patient Active Problem List   Diagnosis Date Noted  . 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 Carotid artery disease ([place]) 03/21/2017  . Hypertension associated with diabetes ([place]) 02/22/2017  . Hyperlipidemia associated with type 2 diabetes mellitus ([place]) 02/22/2017  . PD (Parkinson's disease) ([place]) 08/08/2016  . Loss of weight 07/25/2016  . 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 Elyse Hsu 10/08/2012  . CKD (chronic kidney disease) stage 3, GFR 30-59 ml/min 10/08/2012  . Hypothyroid, s/p radioactive iodine tx for graves 2001 - managed by endocrinologist, Dr. Elyse Hsu 08/24/2012  . Vitamin D deficiency 08/24/2012    Suda Forbess, Annye Rusk MS, CCC-SLP 05/16/2017, 11:55 AM  [facility] [address] [place], [place], [address] Phone: [phone]   Fax:  [phone]   Name: Pamela Patton MRN: [id number] Date of Birth:  1946/06/18

## 2017-05-16 NOTE — Patient Instructions (Addendum)
(  Exercise) Monday Tuesday Wednesday Thursday Friday Saturday Sunday    PWR! Moves Sitting           PWR! Moves Standing           PWR! Moves Supine (on back)           PWR! Moves All 4's-at counter                       Walking            Cycling

## 2017-05-16 NOTE — Therapy (Signed)
[facility] [address] [place], [place], [address] Phone: [phone]   Fax:  [phone]  Occupational Therapy Treatment  Patient Details  Name: Pamela Patton MRN: [id number] Date of Birth: [birth year] Referring Provider: Dr. Carles Collet  Encounter Date: 05/16/2017      OT End of Session - 05/16/17 1107    Visit Number 4   Number of Visits 11   Date for OT Re-Evaluation 06/08/17   Authorization Type UHC MCR   Authorization Time Period 60 days 7/17/.18-9/15/18   Authorization - Visit Number 4   Authorization - Number of Visits 10   OT Start Time 1106   OT Stop Time 1148   OT Time Calculation (min) 42 min   Activity Tolerance Patient tolerated treatment well   Behavior During Therapy [facility] for tasks assessed/performed      Past Medical History:  Diagnosis Date  . Anemia   . Arthritis   . Cataract    immature   . Chest pain summer 2012  . Chicken pox   . Diverticulosis    difficulty with colonoscopy 2005  . 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 Elyse Hsu 10/08/2012  . Fibroid    fibroid tumor  . Fracture    radial/ulnar 03/2016  . GERD (gastroesophageal reflux disease)   . Heart murmur    innocent murmur  . High cholesterol   . Hypertension   . Hypothyroid, s/p radioactive iodine tx for graves 2001 08/24/2012  . Intestinal metaplasia of gastric mucosa   . Kidney disease   . PD (Parkinson's disease) ([place]) 08/08/2016  . Renal cancer ([place])   . UTI (urinary tract infection)   . Weight loss    eval with GI in 2016; s/p CT/MRI and PET scans    Past Surgical History:  Procedure Laterality Date  . ABDOMINAL HYSTERECTOMY  1989  . COLONOSCOPY    . DG BARIUM SWALLOW ([place] HX)     9 yrs ago per pt due to incomplete colonsocopy    There were no vitals filed for this visit.      Subjective Assessment - 05/16/17 1107    Subjective  Some days are really good.   Pertinent History PD, left wrist fx   Patient Stated Goals coordination and strength in her hands   Currently in Pain? No/denies        Self Care:    Began instruction in importance and  in use of large amplitude movements to prevent future complications related to PD.  Pt verbalized understanding.   Eating:  Instructed pt in strategies for eating including holding utensil in the middle vs. The end, perpendicular scooping, holding fork upright for stabbing.  Pt verbalized understanding.  Practiced grasp/release of cylinder objects (bottles, cups) with use of PWR! Hands/large amplitude movements with min cueing.  Opening/closing bottles with use of large amplitude movement strategies with min cueing.   Dressing:  Practiced buttoning/unbuttoning shirt on table top with min-mod cues for use of PWR! Hands prior to buttoning and use of deliberate/large amplitude movements after instruction.  Pt demo improvement with repetition and use of large amplitude movements.   Practiced sweeping with push broom with adaptive hand position for incr ability to put pressure on broom and min cueing for large amplitude movements.                      OT Education - 05/16/17 1108    Education  Details Big Movements with ADLs--see pt instructions    Person(s) Educated Patient   Methods Explanation;Demonstration;Verbal cues;Handout   Comprehension Verbalized understanding;Returned demonstration;Verbal cues required             OT Long Term Goals - 04/19/17 0817      OT LONG TERM GOAL #1   Title Pt will verbalize understanding of ways to prevent future PD related complications and verbalize understanding of community resources.   Time 5   Period Weeks   Status New     OT LONG TERM GOAL #2   Title I with PD specific HEP following review   Time 5   Period Weeks   Status New     OT LONG TERM GOAL #3   Title Pt will demonstrate improved ease with dressing as evidenced by  decreasing PPT# 4 (don/ doff jacket) to 36 secs or less.   Baseline 40.16   Time 5   Period Weeks   Status New     OT LONG TERM GOAL #4   Title Pt will verbalize understanding of adapted strategies for ADLS/IADLS in order to increase safety and independence.   Time 5   Period Weeks   Status New     OT LONG TERM GOAL #5   Title Pt willdemonstrate increased ease with feeding as evidenced by decreasing PPT#2 (simulated feeding) to 16 secs or less.   Baseline 18.60 secs   Time 5   Period Weeks   Status New               Plan - 05/16/17 1108    Clinical Impression Statement Pt is progressing towards goals.  Pt continues to need min v.c. for large amplitude movements, but responds well to cueing.   Rehab Potential Good   OT Frequency 2x / week   OT Duration 8 weeks   OT Treatment/Interventions Self-care/ADL training;Therapeutic exercise;Cognitive remediation/compensation;Visual/perceptual remediation/compensation;Splinting;Neuromuscular education;Moist Heat;Parrafin;Fluidtherapy;Energy conservation;Therapeutic exercises;Patient/family education;Balance training;Therapeutic activities;Passive range of motion;Manual Therapy;DME and/or AE instruction;Contrast Bath;Ultrasound;Cryotherapy   Plan continue to reinforce large amplitude movement strategies for ADLs.   OT Home Exercise Plan Reviewed PWR! seated and supine   Consulted and Agree with Plan of Care Patient      Patient will benefit from skilled therapeutic intervention in order to improve the following deficits and impairments:  Abnormal gait, Decreased cognition, Impaired vision/preception, Impaired flexibility, Decreased knowledge of use of DME, Decreased coordination, Decreased mobility, Impaired tone, Decreased strength, Decreased range of motion, Decreased endurance, Decreased activity tolerance, Decreased balance, Decreased knowledge of precautions, Difficulty walking, Impaired perceived functional ability, Impaired UE  functional use  Visit Diagnosis: Other symptoms and signs involving the nervous system  Other symptoms and signs involving the musculoskeletal system  Other lack of coordination  Unsteadiness on feet  Abnormal posture  Other abnormalities of gait and mobility    Problem List Patient Active Problem List   Diagnosis Date Noted  . 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 Carotid artery disease ([place]) 03/21/2017  . Hypertension associated with diabetes ([place]) 02/22/2017  . Hyperlipidemia associated with type 2 diabetes mellitus ([place]) 02/22/2017  . PD (Parkinson's disease) ([place]) 08/08/2016  . Loss of weight 07/25/2016  . 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 Elyse Hsu 10/08/2012  . CKD (chronic kidney disease) stage 3, GFR 30-59 ml/min 10/08/2012  . Hypothyroid, s/p radioactive iodine tx for graves 2001 - managed by endocrinologist, Dr. Elyse Hsu 08/24/2012  . Vitamin D deficiency 08/24/2012    [facility]  05/16/2017, 1:04 PM  [facility] [address] [place], [place], [address] Phone: [phone]   Fax:  [phone]  Name: Pamela Patton MRN: [id number] Date of Birth: 04/08/1946   Vianne Bulls, OTR/L [facility] [address] [place], [place]  [address] [phone] phone [phone] 05/16/17 1:04 PM

## 2017-05-16 NOTE — Patient Instructions (Signed)
   Read aloud for volume, and also prosody and inflection - exaggerate your inflection and facial expression  Use nursery rhymes, prayers, poems you have memorized to practice loud volume, prosody, and facial expression  Swing your arms when you walk  Loud "AH!" 5x twice a day  You can practice expressions in the mirror  Be aware of how loud your environment is - project to talk over noise - you did this great today!!

## 2017-05-16 NOTE — Patient Instructions (Signed)
Performing Daily Activities with Big Movements  Pick at least 2 activities a day and perform with BIG, DELIBERATE movements/effort.  Try different activities each day. This can make the activity easier and turn daily activities into exercise to prevent problems in the future!  If you are standing during the activity, make sure to keep feet apart and stand with good/big/PWR! UP posture.  Examples:  Dressing - Push arms in sleeves, twist when putting on jacket, push foot into pants, open hands to pull down shirt/put on socks/pull up pants  Buttoning - Open hands big (PWR! Hands) before fastening each button  Bathing - Wash/dry with long strokes  Brushing your teeth - Big, slow movements  Cutting food - Long deliberate cuts  Eating - Hold utensil in the middle, not the end  Picking up a cup/bottle - Open hand up big and get object all the way in palm  Opening jar/bottle - Move as much as you can with each turn  Putting on seatbelt - Twist when reaching  Hanging up clothes/getting clothes down from closet - Reach with big effort  Putting away groceries/dishes - Reach with big effort  Wiping counter/table - Move in big, long strokes  Stirring while cooking - Exaggerate movement  Cleaning windows - Move in big, long strokes  Sweeping - Move arms in big, long strokes  Vacuuming - Push with big movement  Folding clothes - Exaggerate arm movements  Washing car - Move in big, long strokes  Raking - Move arms in big, long strokes  Changing light bulb - Move as much as you can with each turn  Using a screwdriver - Move as much as you can with each turn  Walking into a store/restaurant - Walk with big steps, swing arms if able  Standing up from a chair/recliner/sofa - Scoot forward, lean forward, and stand with big effort  movement

## 2017-05-17 NOTE — Progress Notes (Signed)
Pamela Patton was seen today in the movement disorders clinic for neurologic consultation at the request of Lucretia Kern, DO.  The consultation is for the evaluation of gait change, cogwheel rigidity and to r/o PD.  The records that were made available to me were reviewed.  Pt reports that she has had unsteadiness at least since June.  She fell then and broke her radius/ulna.  States that she turned around at the sink and thinks that she tripped over her feet and "raggady bottom shoes"  10/11/16 update:  The patient follows up today.  I started her on carbidopa/levodopa 25/100 and she has worked up to one tablet 3 times daily.  "I think that it is helping."  When first started the whole tablets she had mild nausea for 5 minutes only but that is better.  Pt denies falls.  Pt denies lightheadedness, near syncope.  No hallucinations.  Mood has been "pretty good." The patient has been attending therapy at the outpatient rehabilitation center for physical, occupational and speech therapy. She will finish up OT/PT this week and has ST until second week of February.   I have reviewed notes.   A MBE was requested by speech therapy and this is scheduled for 10/13/2016.  No other CV exercise besides for therapy.    02/08/17 update:  Patient seen in follow-up today.  States that "I feel perfectly fine this AM but this past weekend I had abdominal and back pain."  Was better if relaxed and stretched.  She is unsure if it is related to timing of levodopa.   Admits that she was raking leaves a few days before when it started.   She is on carbidopa/levodopa 25/100, one tablet 3 times per day (7-9am/2-3pm/7pm).  She had a modified barium swallow on 10/13/2016 that demonstrated mild pharyngeal phase dysphagia.  Regular diet with thin liquids was recommended.  She had one fall in her sleep and fell off of the bed.  05/19/17 update:  Pt f/u today for Parkinsonism.  The records that were made available to me were reviewed since  last visit.  On carbidopa/levodopa 25/100 tid (8-8:30/12-12:30/6:00).  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  About 2 weeks ago, she did have a very vivid dream and she states that in general "I have been sleeping really nicely."   The records that were made available to me were reviewed, including therapy notes.  States was taken off of norvasc as BP's were going too low.  Wearing off:  No.  How long before next dose:  n/a Falls:   No. N/V:  No. Hallucinations:  No.  visual distortions: No. Lightheaded:  No.  Syncope: No. Dyskinesia:  No.  ALLERGIES:  No Known Allergies  CURRENT MEDICATIONS:  Outpatient Encounter Prescriptions as of 05/19/2017  Medication Sig  . aspirin 81 MG tablet Take 81 mg by mouth daily.  Marland Kitchen atorvastatin (LIPITOR) 40 MG tablet Take 40 mg by mouth daily.  . Calcium Carbonate-Vitamin D (CALTRATE 600+D) 600-400 MG-UNIT per tablet Take 1 tablet by mouth daily.  . carbidopa-levodopa (SINEMET IR) 25-100 MG tablet Take 1 tablet by mouth 3 (three) times daily.  Marland Kitchen HUMALOG KWIKPEN 100 UNIT/ML KiwkPen   . hydrochlorothiazide (HYDRODIURIL) 25 MG tablet Take 25 mg by mouth daily.   . insulin glargine (LANTUS) 100 UNIT/ML injection Inject 25 Units into the skin at bedtime.   Marland Kitchen  levothyroxine (SYNTHROID, LEVOTHROID) 75 MCG tablet Take 75 mcg by mouth daily.  Marland Kitchen losartan (COZAAR) 50 MG tablet TAKE 1 TABLET BY MOUTH DAILY  . METFORMIN HCL PO Take 500 mg by mouth 2 (two) times daily.   . Multiple Vitamins-Minerals (PRESERVISION AREDS PO) Take by mouth.  . Pioglitazone HCl (ACTOS PO) Take 45 mg by mouth daily.    Facility-Administered Encounter Medications as of 05/19/2017  Medication  . 0.9 %  sodium chloride infusion    PAST MEDICAL HISTORY:   Past Medical History:  Diagnosis Date  . Anemia   . Arthritis   . Cataract    immature   . Chest pain summer 2012  . Chicken pox   . Diverticulosis     difficulty with colonoscopy 2005  . 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 Elyse Hsu 10/08/2012  . Fibroid    fibroid tumor  . Fracture    radial/ulnar 03/2016  . GERD (gastroesophageal reflux disease)   . Heart murmur    innocent murmur  . High cholesterol   . Hypertension   . Hypothyroid, s/p radioactive iodine tx for graves 2001 08/24/2012  . Intestinal metaplasia of gastric mucosa   . Kidney disease   . PD (Parkinson's disease) ([place]) 08/08/2016  . Renal cancer ([place])   . UTI (urinary tract infection)   . Weight loss    eval with GI in 2016; s/p CT/MRI and PET scans    PAST SURGICAL HISTORY:   Past Surgical History:  Procedure Laterality Date  . ABDOMINAL HYSTERECTOMY  1989  . COLONOSCOPY    . DG BARIUM SWALLOW ([place] HX)     9 yrs ago per pt due to incomplete colonsocopy    SOCIAL HISTORY:   Social History   Social History  . Marital status: Single    Spouse name: N/A  . Number of children: N/A  . Years of education: N/A   Occupational History  . retired     taught at [employer], histology, anatomy and physiology   Social History Main Topics  . Smoking status: Never Smoker  . Smokeless tobacco: Never Used  . Alcohol use No  . Drug use: No  . Sexual activity: Not on file   Other Topics Concern  . 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 Mother Deceased  . Father Deceased  . MGM Deceased  . MGF Deceased  . PGM Deceased  . PGF Deceased  . Brother Deceased  . Sister Alive  . Sister Deceased  . Sister Alive  . Neg Hx  (Not Specified)    ROS:  A complete 10 system review of systems was obtained and was unremarkable apart from what is mentioned above.  PHYSICAL EXAMINATION:    VITALS:   Vitals:   05/19/17 0855  BP: 102/70  Pulse: 75  SpO2: 93%  Weight: 140 lb (63.5 kg)  Height: 5\' 6"  (1.676 m)   Wt Readings from  Last 3 Encounters:  05/19/17 140 lb (63.5 kg)  05/04/17 142 lb (64.4 kg)  04/27/17 143 lb 11.2 oz (65.2 kg)    GEN:  The patient appears stated age and is in NAD. HEENT:  Normocephalic, atraumatic.  The mucous membranes are moist. The superficial temporal arteries are without ropiness or tenderness. CV:  RRR Lungs:  CTAB Neck/HEME:  There are no carotid bruits bilaterally.   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  Neurological examination:  Orientation: The patient is alert and oriented x3.  Cranial nerves: There is good facial symmetry. There is facial hypomimia.   The speech is fluent and clear. Speech is pseudobulbar in quality.  Soft palate rises symmetrically and there is no tongue deviation. Hearing is intact to conversational tone. Sensation: Sensation is intact to light and pinprick throughout (facial, trunk, extremities). Vibration is intact at the bilateral big toe. There is no extinction with double simultaneous stimulation. There is no sensory dermatomal level identified. Motor: Strength is 5/5 in the bilateral upper and lower extremities with the exception of abduction of fingers on the left and strength there is 4/5.   Shoulder shrug is equal and symmetric.  There is no pronator drift.   Movement examination: Tone: There is mild increased tone in the LUE and the L arm is held in a flexed fashion Abnormal movements: There is rare tremor in the L hand and in the chin Coordination:  There is still decremation with RAM's, with hand opening and closing, finger taps, alternation of supination/pronation of the forearm on the left.  There is decremation with  finger taps on the right.  All other forms of rapid alternating movements on the right were normal.  All forms of rapid alternating movements in the lower extremities bilaterally were normal. Gait and Station: The patient has no significant difficulty arising out of a deep-seated chair without the use of the hands. The patient's stride length is normal with very purposeful but good arm swing bilaterally  LABS    Chemistry      Component Value Date/Time   NA 142 04/27/2017   K 3.8 04/27/2017   CL 106 06/03/2016 1444   CO2 31 06/03/2016 1444   BUN 23 (A) 04/27/2017   CREATININE 1.2 (A) 04/27/2017   CREATININE 1.30 (H) 03/07/2017 1050   GLU 154 04/27/2017      Component Value Date/Time   CALCIUM 9.8 06/03/2016 1444   ALKPHOS 55 04/27/2017   AST 14 04/27/2017   ALT 10 04/27/2017   BILITOT 0.6 06/03/2016 1444     Lab Results  Component Value Date   TSH 0.30 (A) 04/27/2017   No results found for: VITAMINB12   ASSESSMENT/PLAN:  1.  Parkinsonism.  I suspect that this does represent idiopathic Parkinson's disease but I do think that an atypical state like PSP is in the ddx given diplopia and pseudobulbar speech.  However, tremor is somewhat atypical in PSP.  I discussed with her that time will help [place] differentiate the 2.  In addition, I told her that treatments were the same, although I told her that prognosis was very different.  -increase carbidopa/levodopa 25/100, 1.5 tablets tid.  Currently spreading meds out a lot.  Asked her to watch closely to see if med wears off.  Suspect that may need an increase but not sure if back pain/m spasm related to wearing off or not as she experienced it after raking leaves but does seem to come and go  in cycles.  Is somewhat underdosed  -congratulated patient on the exercising at the [facility] cycle program.  She is very faithful with this.  2.  Dysphagia  -She had a modified barium swallow on 10/13/2016 that demonstrated mild pharyngeal phase  dysphagia.  Regular diet with thin liquids was recommended.  3.  Low blood pressure  -she has been taken off of norvasc but is still on cozaar and HCTZ.  Will need to monitor closely as BP remains low.  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  She is to keep a log of BPs and report those  4.  RBD  -watching closely. She doesn't want more medication but was agreeable to trying safety rails and talked to her about buying them on [place]    5. Much greater than 50% of this visit was spent in counseling and coordinating care.  Total face to face time:  30 min     Cc:  Lucretia Kern, DO

## 2017-05-18 ENCOUNTER — Ambulatory Visit: Payer: Medicare Other | Admitting: Speech Pathology

## 2017-05-18 ENCOUNTER — Ambulatory Visit: Payer: Medicare Other | Admitting: Occupational Therapy

## 2017-05-18 DIAGNOSIS — R29898 Other symptoms and signs involving the musculoskeletal system: Secondary | ICD-10-CM

## 2017-05-18 DIAGNOSIS — R29818 Other symptoms and signs involving the nervous system: Secondary | ICD-10-CM | POA: Diagnosis not present

## 2017-05-18 DIAGNOSIS — R2681 Unsteadiness on feet: Secondary | ICD-10-CM

## 2017-05-18 DIAGNOSIS — R278 Other lack of coordination: Secondary | ICD-10-CM

## 2017-05-18 DIAGNOSIS — R471 Dysarthria and anarthria: Secondary | ICD-10-CM

## 2017-05-18 DIAGNOSIS — R2689 Other abnormalities of gait and mobility: Secondary | ICD-10-CM

## 2017-05-18 DIAGNOSIS — R293 Abnormal posture: Secondary | ICD-10-CM

## 2017-05-18 NOTE — Therapy (Signed)
[facility] [address] [place], [place], [address] Phone: [phone]   Fax:  [phone]  Speech Language Pathology Treatment  Patient Details  Name: Pamela Patton MRN: [id number] Date of Birth: [birth year] Referring Provider: [name], DO  Encounter Date: 05/18/2017      End of Session - 05/18/17 1410    Visit Number 5   Number of Visits 17   Date for SLP Re-Evaluation 06/26/17   SLP Start Time 1856   SLP Stop Time  1403   SLP Time Calculation (min) 46 min   Activity Tolerance Patient tolerated treatment well      Past Medical History:  Diagnosis Date  . Anemia   . Arthritis   . Cataract    immature   . Chest pain summer 2012  . Chicken pox   . Diverticulosis    difficulty with colonoscopy 2005  . 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 Elyse Hsu 10/08/2012  . Fibroid    fibroid tumor  . Fracture    radial/ulnar 03/2016  . GERD (gastroesophageal reflux disease)   . Heart murmur    innocent murmur  . High cholesterol   . Hypertension   . Hypothyroid, s/p radioactive iodine tx for graves 2001 08/24/2012  . Intestinal metaplasia of gastric mucosa   . Kidney disease   . PD (Parkinson's disease) ([place]) 08/08/2016  . Renal cancer ([place])   . UTI (urinary tract infection)   . Weight loss    eval with GI in 2016; s/p CT/MRI and PET scans    Past Surgical History:  Procedure Laterality Date  . ABDOMINAL HYSTERECTOMY  1989  . COLONOSCOPY    . DG BARIUM SWALLOW ([place] HX)     9 yrs ago per pt due to incomplete colonsocopy    There were no vitals filed for this visit.      Subjective Assessment - 05/18/17 1328    Subjective "I know there's a difference between my singing /a/ and my loud /a/   Currently in Pain? No/denies               ADULT SLP TREATMENT - 05/18/17 1330      General Information   Behavior/Cognition  Alert;Cooperative;Pleasant mood     Treatment Provided   Treatment provided Cognitive-Linquistic     Pain Assessment   Pain Assessment No/denies pain     Cognitive-Linquistic Treatment   Treatment focused on Dysarthria   Skilled Treatment Loud /a/ average 95dB with mod I to recalibrate loudness. Facilitated maintaining volume in tasks with higher level cognitive tasks to divide attention between volume and cogntive task with occasional min A to maintiain volume.      Progression Toward Goals   Progression toward goals Progressing toward goals            SLP Short Term Goals - 05/18/17 1410      SLP SHORT TERM GOAL #1   Title pt will demo appropriate breath support in 19/20 sentence responses over three sessions   Baseline 05-09-17, 05-11-17, 05/16/17   Time 4   Period Weeks   Status Achieved     SLP SHORT TERM GOAL #2   Title pt will respond to structured speech tasks with sentences in a noisy environment with 100% intelligibility over three sessions   Baseline 05-11-17, 05/16/17   Time 4   Period Weeks   Status On-going     SLP SHORT TERM GOAL #  3   Title pt will demo speech at low 70s dB average in 20 minutes mod complex conversation over three sessions   Baseline 05-09-17; 05-11-17, 05/16/17   Time 4   Period Weeks   Status Achieved          SLP Long Term Goals - 05/18/17 1410      SLP LONG TERM GOAL #1   Title pt will produce 20 minutes mod complex-complex conversational speech with average low 70s dB over three sessions   Time 8   Period Weeks   Status Revised     SLP LONG TERM GOAL #2   Title pt will demo appropriate abdominal breath support necessary for intelligible speech in 20 minutes conversation   Time 8   Period Weeks   Status On-going          Plan - 05/18/17 1358    Clinical Impression Statement Facilitated maintence of volume while completing high level cognitive tasks with occasional to usual min visual cues to maintain volume. Educated pt re:  difficulty multi tasking and to be aware of volume decay with higher cognitive load.   Speech Therapy Frequency 2x / week   Treatment/Interventions Aspiration precaution training;Pharyngeal strengthening exercises;Diet toleration management by SLP;Cueing hierarchy;Internal/external aids;SLP instruction and feedback;Functional tasks;Patient/family education;Compensatory strategies;Oral motor exercises   Potential to Achieve Goals Good      Patient will benefit from skilled therapeutic intervention in order to improve the following deficits and impairments:   Dysarthria and anarthria    Problem List Patient Active Problem List   Diagnosis Date Noted  . 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 Carotid artery disease ([place]) 03/21/2017  . Hypertension associated with diabetes ([place]) 02/22/2017  . Hyperlipidemia associated with type 2 diabetes mellitus ([place]) 02/22/2017  . PD (Parkinson's disease) ([place]) 08/08/2016  . Loss of weight 07/25/2016  . 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 Elyse Hsu 10/08/2012  . CKD (chronic kidney disease) stage 3, GFR 30-59 ml/min 10/08/2012  . Hypothyroid, s/p radioactive iodine tx for graves 2001 - managed by endocrinologist, Dr. Elyse Hsu 08/24/2012  . Vitamin D deficiency 08/24/2012    Khalifa Knecht, Annye Rusk MS, CCC-SLP 05/18/2017, 2:13 PM  [facility] [address] [place], [place], [address] Phone: [phone]   Fax:  [phone]   Name: Pamela Patton MRN: [id number] Date of Birth: [birth year]

## 2017-05-18 NOTE — Patient Instructions (Signed)
  Loud "AH!" 5x twice a day  When you are thinking hard, with a high cognitive load, volume can go down - be aware of this  Do cognitive activities daily    Cognitive Activities you can do at home:   - [place]  - Chess/Checkers  - Crosswords (easy level)  - [place]

## 2017-05-18 NOTE — Therapy (Signed)
[facility] [address] [place], [place], [address] Phone: [phone]   Fax:  [phone]  Occupational Therapy Treatment  Patient Details  Name: Pamela Patton MRN: [id number] Date of Birth: 11-24-1945 Referring Provider: Dr. Carles Collet  Encounter Date: 05/18/2017      OT End of Session - 05/18/17 1720    Visit Number 5   Number of Visits 11   Date for OT Re-Evaluation 06/08/17   Authorization Type U[employer] MCR   Authorization Time Period 60 days 7/17/.18-9/15/18   Authorization - Visit Number 5   Authorization - Number of Visits 10   OT Start Time 1408   OT Stop Time 1452   OT Time Calculation (min) 44 min   Activity Tolerance Patient tolerated treatment well   Behavior During Therapy [facility] for tasks assessed/performed      Past Medical History:  Diagnosis Date  . Anemia   . Arthritis   . Cataract    immature   . Chest pain summer 2012  . Chicken pox   . Diverticulosis    difficulty with colonoscopy 2005  . 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 Elyse Hsu 10/08/2012  . Fibroid    fibroid tumor  . Fracture    radial/ulnar 03/2016  . GERD (gastroesophageal reflux disease)   . Heart murmur    innocent murmur  . High cholesterol   . Hypertension   . Hypothyroid, s/p radioactive iodine tx for graves 2001 08/24/2012  . Intestinal metaplasia of gastric mucosa   . Kidney disease   . PD (Parkinson's disease) ([place]) 08/08/2016  . Renal cancer ([place])   . UTI (urinary tract infection)   . Weight loss    eval with GI in 2016; s/p CT/MRI and PET scans    Past Surgical History:  Procedure Laterality Date  . ABDOMINAL HYSTERECTOMY  1989  . COLONOSCOPY    . DG BARIUM SWALLOW ([place] HX)     9 yrs ago per pt due to incomplete colonsocopy    There were no vitals filed for this visit.      Subjective Assessment - 05/18/17 1414    Subjective  Pt reports that she has tried  to use big movements with ADLs/IADLs.   Pertinent History PD, left wrist fx   Patient Stated Goals coordination and strength in her hands   Currently in Pain? No/denies        Self Care:     Continued instruction in importance and  in use of large amplitude movements to prevent future complications related to PD.  Pt verbalized understanding.   Dressing:   Practiced donning/doffing t-shirt using large amplitude movement strategies after initial instruction.  Pt with min-mod difficulty, but improved with use of strategy/use of large amplitude movements, may benefit from repetition/reinforcement (gather shirt, pull over head, punch in arms, get more material in hand to pull down).  Simulated donning/doffing pants with bag with focus/min cues for large amplitude movements--focus on wt. Shift and coordinated UE/LE movement, large amplitude, and lean forward.  Pt instructed in strategy for pulling up pants/underwear (put thumb all the way in waistband, get incr material in hand, pull intentionally).  Donning/doffing socks with min cueing for large amplitude movements techniques after initial instruction with improvement noted.  Recommended elastic shoelace due to difficulty loosening shoelaces to don shoe and/or discussed using large amplitude movements.  Pt verbalized understanding.    Practiced Adult nurse in hand on table  with use of large amplitude hand movements and then practiced picking up shirt by opening hand flat.  Pt returned demo with each hand with min cueing.  Recommended stretching when watching tv by placing hand flat on table and pushing down.  Pt verbalized understanding.  Functional mobility:    Sit>stand with min cues for large amplitude movement technique.                          OT Short Term Goals - 04/19/17 0830      OT SHORT TERM GOAL #3   Status --     OT SHORT TERM GOAL #4   Status --     OT SHORT TERM GOAL #5    Status --     OT SHORT TERM GOAL #6   Status --           OT Long Term Goals - 04/19/17 0817      OT LONG TERM GOAL #1   Title Pt will verbalize understanding of ways to prevent future PD related complications and verbalize understanding of community resources.   Time 5   Period Weeks   Status New     OT LONG TERM GOAL #2   Title I with PD specific HEP following review   Time 5   Period Weeks   Status New     OT LONG TERM GOAL #3   Title Pt will demonstrate improved ease with dressing as evidenced by decreasing PPT# 4 (don/ doff jacket) to 36 secs or less.   Baseline 40.16   Time 5   Period Weeks   Status New     OT LONG TERM GOAL #4   Title Pt will verbalize understanding of adapted strategies for ADLS/IADLS in order to increase safety and independence.   Time 5   Period Weeks   Status New     OT LONG TERM GOAL #5   Title Pt willdemonstrate increased ease with feeding as evidenced by decreasing PPT#2 (simulated feeding) to 16 secs or less.   Baseline 18.60 secs   Time 5   Period Weeks   Status New               Plan - 05/18/17 1720    Clinical Impression Statement Pt is progressing towards goals, but continues to need cueing for opening hand for grasp of objects/clothing and would benefit from repetition.   Rehab Potential Good   OT Frequency 2x / week   OT Duration 8 weeks   OT Treatment/Interventions Self-care/ADL training;Therapeutic exercise;Cognitive remediation/compensation;Visual/perceptual remediation/compensation;Splinting;Neuromuscular education;Moist Heat;Parrafin;Fluidtherapy;Energy conservation;Therapeutic exercises;Patient/family education;Balance training;Therapeutic activities;Passive range of motion;Manual Therapy;DME and/or AE instruction;Contrast Bath;Ultrasound;Cryotherapy   Plan donning/doffing jacket with large movements, continue to reinforce PWR! hands prior to grasp   OT Home Exercise Plan Reviewed PWR! seated and supine    Consulted and Agree with Plan of Care Patient      Patient will benefit from skilled therapeutic intervention in order to improve the following deficits and impairments:  Abnormal gait, Decreased cognition, Impaired vision/preception, Impaired flexibility, Decreased knowledge of use of DME, Decreased coordination, Decreased mobility, Impaired tone, Decreased strength, Decreased range of motion, Decreased endurance, Decreased activity tolerance, Decreased balance, Decreased knowledge of precautions, Difficulty walking, Impaired perceived functional ability, Impaired UE functional use  Visit Diagnosis: Other symptoms and signs involving the nervous system  Other symptoms and signs involving the musculoskeletal system  Other lack of coordination  Unsteadiness on feet  Abnormal  posture  Other abnormalities of gait and mobility    Problem List Patient Active Problem List   Diagnosis Date Noted  . 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 Carotid artery disease ([place]) 03/21/2017  . Hypertension associated with diabetes ([place]) 02/22/2017  . Hyperlipidemia associated with type 2 diabetes mellitus ([place]) 02/22/2017  . PD (Parkinson's disease) ([place]) 08/08/2016  . Loss of weight 07/25/2016  . 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 Elyse Hsu 10/08/2012  . CKD (chronic kidney disease) stage 3, GFR 30-59 ml/min 10/08/2012  . Hypothyroid, s/p radioactive iodine tx for graves 2001 - managed by endocrinologist, Dr. Elyse Hsu 08/24/2012  . Vitamin D deficiency 08/24/2012    [facility] 05/18/2017, 5:24 PM  [facility] [address] [place], [place], [address] Phone: [phone]   Fax:  [phone]  Name: OVETA IDRIS MRN: [id number] Date of Birth: Apr 07, 1946   Vianne Bulls, OTR/L [facility] [address] [place],   [address] [phone]  phone [phone] 05/18/17 5:24 PM

## 2017-05-19 ENCOUNTER — Ambulatory Visit (INDEPENDENT_AMBULATORY_CARE_PROVIDER_SITE_OTHER): Payer: Medicare Other | Admitting: Neurology

## 2017-05-19 ENCOUNTER — Encounter: Payer: Self-pay | Admitting: Neurology

## 2017-05-19 VITALS — BP 102/70 | HR 75 | Ht 66.0 in | Wt 140.0 lb

## 2017-05-19 DIAGNOSIS — I9589 Other hypotension: Secondary | ICD-10-CM

## 2017-05-19 DIAGNOSIS — G4752 REM sleep behavior disorder: Secondary | ICD-10-CM

## 2017-05-19 DIAGNOSIS — G20 Parkinson's disease: Secondary | ICD-10-CM | POA: Diagnosis not present

## 2017-05-19 NOTE — Patient Instructions (Signed)
Increase carbidopa/levodopa 25/100 to 1.5 tablets three times a day.  Take it at the same times as you are currently doing it.

## 2017-05-23 ENCOUNTER — Ambulatory Visit: Payer: Medicare Other | Admitting: Physical Therapy

## 2017-05-23 ENCOUNTER — Ambulatory Visit: Payer: Medicare Other | Admitting: Speech Pathology

## 2017-05-23 ENCOUNTER — Ambulatory Visit: Payer: Medicare Other | Admitting: Occupational Therapy

## 2017-05-23 DIAGNOSIS — R293 Abnormal posture: Secondary | ICD-10-CM

## 2017-05-23 DIAGNOSIS — R29818 Other symptoms and signs involving the nervous system: Secondary | ICD-10-CM

## 2017-05-23 DIAGNOSIS — R29898 Other symptoms and signs involving the musculoskeletal system: Secondary | ICD-10-CM

## 2017-05-23 DIAGNOSIS — R2681 Unsteadiness on feet: Secondary | ICD-10-CM

## 2017-05-23 DIAGNOSIS — R278 Other lack of coordination: Secondary | ICD-10-CM

## 2017-05-23 NOTE — Therapy (Signed)
[facility] [address] [place], [place], [address] Phone: [phone]   Fax:  [phone]  Occupational Therapy Treatment  Patient Details  Name: Pamela Patton MRN: [id number] Date of Birth: 1946/03/09 Referring Provider: Dr. Carles Collet  Encounter Date: 05/23/2017      OT End of Session - 05/23/17 1527    Visit Number 6   Number of Visits 11   Date for OT Re-Evaluation 06/08/17   Authorization Type UHC MCR   Authorization Time Period 60 days 7/17/.18-9/15/18   Authorization - Visit Number 6   Authorization - Number of Visits 10   OT Start Time 1450   OT Stop Time 1530   OT Time Calculation (min) 40 min      Past Medical History:  Diagnosis Date  . Anemia   . Arthritis   . Cataract    immature   . Chest pain summer 2012  . Chicken pox   . Diverticulosis    difficulty with colonoscopy 2005  . 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 Elyse Hsu 10/08/2012  . Fibroid    fibroid tumor  . Fracture    radial/ulnar 03/2016  . GERD (gastroesophageal reflux disease)   . Heart murmur    innocent murmur  . High cholesterol   . Hypertension   . Hypothyroid, s/p radioactive iodine tx for graves 2001 08/24/2012  . Intestinal metaplasia of gastric mucosa   . Kidney disease   . PD (Parkinson's disease) ([place]) 08/08/2016  . Renal cancer ([place])   . UTI (urinary tract infection)   . Weight loss    eval with GI in 2016; s/p CT/MRI and PET scans    Past Surgical History:  Procedure Laterality Date  . ABDOMINAL HYSTERECTOMY  1989  . COLONOSCOPY    . DG BARIUM SWALLOW ([place] HX)     9 yrs ago per pt due to incomplete colonsocopy    There were no vitals filed for this visit.      Subjective Assessment - 05/23/17 1451    Pertinent History PD, left wrist fx   Patient Stated Goals coordination and strength in her hands   Currently in Pain? No/denies      Treatment: dynamic  steps and reach to grasp release cylindrical objects with right and left hands with big movements, min v.c. Multi directional stepping while following instructions, demo, and min v.c Standing to copy small peg design with bilateral UE's for coordination task with cognitive component, min difficulty/ v.c                        OT Education - 05/23/17 1553    Education provided Yes   Education Details reviewed donning/ doffing jacket and fastening buttons with adapted strategies   Person(s) Educated Patient   Methods Explanation;Demonstration;Verbal cues;Handout   Comprehension Verbalized understanding;Returned demonstration;Verbal cues required             OT Long Term Goals - 04/19/17 0817      OT LONG TERM GOAL #1   Title Pt will verbalize understanding of ways to prevent future PD related complications and verbalize understanding of community resources.   Time 5   Period Weeks   Status New     OT LONG TERM GOAL #2   Title I with PD specific HEP following review   Time 5   Period Weeks   Status New     OT LONG  TERM GOAL #3   Title Pt will demonstrate improved ease with dressing as evidenced by decreasing PPT# 4 (don/ doff jacket) to 36 secs or less.   Baseline 40.16   Time 5   Period Weeks   Status New     OT LONG TERM GOAL #4   Title Pt will verbalize understanding of adapted strategies for ADLS/IADLS in order to increase safety and independence.   Time 5   Period Weeks   Status New     OT LONG TERM GOAL #5   Title Pt willdemonstrate increased ease with feeding as evidenced by decreasing PPT#2 (simulated feeding) to 16 secs or less.   Baseline 18.60 secs   Time 5   Period Weeks   Status New               Plan - 05/23/17 1528    Clinical Impression Statement Pt demonstrates progress towards goals. She demonstrates improved performance of donning jacket and fastening buttons using adapted strategies.   Rehab Potential Good   OT  Frequency 2x / week   OT Duration 8 weeks   OT Treatment/Interventions Self-care/ADL training;Therapeutic exercise;Cognitive remediation/compensation;Visual/perceptual remediation/compensation;Splinting;Neuromuscular education;Moist Heat;Parrafin;Fluidtherapy;Energy conservation;Therapeutic exercises;Patient/family education;Balance training;Therapeutic activities;Passive range of motion;Manual Therapy;DME and/or AE instruction;Contrast Bath;Ultrasound;Cryotherapy   Plan continue to address PWR! hands with grasp, ways to prevent future complications/ community resources   OT Home Exercise Plan Reviewed PWR! seated and supine   Consulted and Agree with Plan of Care Patient      Patient will benefit from skilled therapeutic intervention in order to improve the following deficits and impairments:  Abnormal gait, Decreased cognition, Impaired vision/preception, Impaired flexibility, Decreased knowledge of use of DME, Decreased coordination, Decreased mobility, Impaired tone, Decreased strength, Decreased range of motion, Decreased endurance, Decreased activity tolerance, Decreased balance, Decreased knowledge of precautions, Difficulty walking, Impaired perceived functional ability, Impaired UE functional use  Visit Diagnosis: Other symptoms and signs involving the nervous system  Other symptoms and signs involving the musculoskeletal system  Other lack of coordination  Abnormal posture    Problem List Patient Active Problem List   Diagnosis Date Noted  . 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 Carotid artery disease ([place]) 03/21/2017  . Hypertension associated with diabetes ([place]) 02/22/2017  . Hyperlipidemia associated with type 2 diabetes mellitus ([place]) 02/22/2017  . PD (Parkinson's disease) ([place]) 08/08/2016  . Loss of weight 07/25/2016  . 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 Elyse Hsu 10/08/2012  . CKD (chronic kidney disease) stage 3,  GFR 30-59 ml/min 10/08/2012  . Hypothyroid, s/p radioactive iodine tx for graves 2001 - managed by endocrinologist, Dr. Elyse Hsu 08/24/2012  . Vitamin D deficiency 08/24/2012    Gustaf Mccarter 05/23/2017, 3:54 PM Theone Murdoch, OTR/L [phone] Phone: [phone] 3:57 PM 05/23/17 [facility] [address] [place], [place], [address] Phone: [phone]   Fax:  [phone]  Name: Pamela Patton MRN: [id number] Date of Birth: [birth year]

## 2017-05-24 NOTE — Therapy (Signed)
[facility] [address] [place], [place], [address] Phone: [phone]   Fax:  [phone]  Physical Therapy Treatment  Patient Details  Name: Pamela Patton MRN: [id number] Date of Birth: 01/17/1946 Referring Provider: [name], DO  Encounter Date: 05/23/2017      PT End of Session - 05/24/17 2016    Visit Number 3   Number of Visits 6   Date for PT Re-Evaluation 06/18/17   Authorization Type UHC Medicare-GCODE every 10th visit   PT Start Time 1405   PT Stop Time 1447   PT Time Calculation (min) 42 min   Activity Tolerance Patient tolerated treatment well   Behavior During Therapy [facility] for tasks assessed/performed      Past Medical History:  Diagnosis Date  . Anemia   . Arthritis   . Cataract    immature   . Chest pain summer 2012  . Chicken pox   . Diverticulosis    difficulty with colonoscopy 2005  . 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 Elyse Hsu 10/08/2012  . Fibroid    fibroid tumor  . Fracture    radial/ulnar 03/2016  . GERD (gastroesophageal reflux disease)   . Heart murmur    innocent murmur  . High cholesterol   . Hypertension   . Hypothyroid, s/p radioactive iodine tx for graves 2001 08/24/2012  . Intestinal metaplasia of gastric mucosa   . Kidney disease   . PD (Parkinson's disease) ([place]) 08/08/2016  . Renal cancer ([place])   . UTI (urinary tract infection)   . Weight loss    eval with GI in 2016; s/p CT/MRI and PET scans    Past Surgical History:  Procedure Laterality Date  . ABDOMINAL HYSTERECTOMY  1989  . COLONOSCOPY    . DG BARIUM SWALLOW ([place] HX)     9 yrs ago per pt due to incomplete colonsocopy    There were no vitals filed for this visit.      Subjective Assessment - 05/24/17 2004    Subjective Things going well.  Saw Dr. Carles Collet this week, and she is pleased that I'm exercising regularly.   Pertinent History DM, fracture L  ulna/radius 03/2016, GERD, HTN   Patient Stated Goals Pt's goal for therapy is to work on improved coordination.   Currently in Pain? No/denies   Pain Onset --                   Therapeutic Exercise:       [facility] Adult PT Treatment/Exercise - 05/24/17 0001      Exercises   Exercises Other Exercises   Other Exercises  Postural exercises at doorframe:  scapular retraction x 10 reps, then cervical retraction x 10 reps; against wall:  wall squats x 10 reps, with cues for upright posture and foot positioning.       Neuro Re-education:     PWR [facility]) - 05/23/17 1411    PWR! exercises Moves in standing   PWR! Up x 10, 2 sets   PWR! Rock x 10 reps, 2 sets   PWR! Twist x 10 reps, 2 sets   PWR Step x 10 reps side, x 10 reps forward, x 10 reps back   Comments Pt performs first set of PWR! Moves at slowed intensity and needs minimal cues for technique; cues for patient to perform second set with increased intensity and pace.  Balance Exercises - 05/24/17 2011      Balance Exercises: Standing   Stepping Strategy Anterior;Posterior  step and weightshift x 10 reps   Other Standing Exercises Forward/back step and weightshift x 10 reps each side; stagger stance forward/back rocking x at least 10 reps each foot position, with cues for correct weightshift and technique           PT Education - 05/24/17 2015    Education provided Yes   Education Details use of slowed pace for PWR! Moves for stretching; increased pace and intensity for muscle activation    Person(s) Educated Patient   Methods Explanation;Demonstration   Comprehension Verbalized understanding;Returned demonstration;Need further instruction             PT Long Term Goals - 05/16/17 2112      PT LONG TERM GOAL #1   Title Pt will be independent with progressive HEP, including walking program, to address Parkinson's related deficits.     Time 5   Period Weeks   Status New   Target Date  06/18/17  Updated target     PT LONG TERM GOAL #2   Title Pt will improve 3 minute walk test to at least 570 ft in 3 minute walk test for improved gait efficiency.   Time 5   Period Weeks   Status New   Target Date 06/18/17     PT LONG TERM GOAL #3   Title Pt will ambulate at least 1000 ft, indoor/outdoor surfaces, independently for improved dynamic gait.   Time 5   Period Weeks   Status New   Target Date 06/18/17     PT LONG TERM GOAL #4   Title Pt will verbalize plans for continued community fitness upon d/c from PT.   Time 5   Period Weeks   Status New   Target Date 06/18/17               Plan - 05/24/17 2017    Clinical Impression Statement Reviewed HEP from last visit and continued to work on activities address posture, balance and overall intensity of movement.  Pt needs increased cueing for step and weightshifting activities.  Pt will continue to benefit from skilled PT to address balance, posture, and gait.   Rehab Potential Good   PT Frequency 1x / week   PT Duration --  5 weeks plus eval   PT Treatment/Interventions ADLs/Self Care Home Management;Functional mobility training;Gait training;Patient/family education;Neuromuscular re-education;Balance training;Therapeutic exercise;Therapeutic activities   PT Next Visit Plan Continue work on postural strengthening, stepping strategies in anterior/posterior direction; gait activities-include head turns with gait/try corner balance exercises   Consulted and Agree with Plan of Care Patient      Patient will benefit from skilled therapeutic intervention in order to improve the following deficits and impairments:  Abnormal gait, Decreased balance, Decreased mobility, Difficulty walking, Postural dysfunction  Visit Diagnosis: Unsteadiness on feet  Abnormal posture     Problem List Patient Active Problem List   Diagnosis Date Noted  . 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 Carotid artery disease ([place])  03/21/2017  . Hypertension associated with diabetes ([place]) 02/22/2017  . Hyperlipidemia associated with type 2 diabetes mellitus ([place]) 02/22/2017  . PD (Parkinson's disease) ([place]) 08/08/2016  . Loss of weight 07/25/2016  . 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 Elyse Hsu 10/08/2012  . CKD (chronic kidney disease) stage 3, GFR 30-59 ml/min 10/08/2012  . Hypothyroid, s/p radioactive iodine tx  for graves 2001 - managed by endocrinologist, Dr. Elyse Hsu 08/24/2012  . Vitamin D deficiency 08/24/2012    Egan Berkheimer W. 05/24/2017, 8:21 PM  Frazier Butt., PT   [facility] [address] [place], [place], [address] Phone: [phone]   Fax:  [phone]  Name: ALLANNA BRESEE MRN: [id number] Date of Birth: 1946-03-28

## 2017-05-25 ENCOUNTER — Ambulatory Visit: Payer: Medicare Other

## 2017-05-25 ENCOUNTER — Ambulatory Visit: Payer: Medicare Other | Admitting: Occupational Therapy

## 2017-05-25 DIAGNOSIS — R471 Dysarthria and anarthria: Secondary | ICD-10-CM

## 2017-05-25 DIAGNOSIS — R29818 Other symptoms and signs involving the nervous system: Secondary | ICD-10-CM | POA: Diagnosis not present

## 2017-05-25 DIAGNOSIS — R29898 Other symptoms and signs involving the musculoskeletal system: Secondary | ICD-10-CM

## 2017-05-25 DIAGNOSIS — R293 Abnormal posture: Secondary | ICD-10-CM

## 2017-05-25 DIAGNOSIS — R278 Other lack of coordination: Secondary | ICD-10-CM

## 2017-05-25 NOTE — Patient Instructions (Signed)
Ways to prevent future Parkinson's related complications: 1.   Exercise regularly,  2.   Focus on BIGGER movements during daily activities- really reach overhead, straighten elbows and extend fingers 3.   When dressing or reaching for your seatbelt make sure to use your body to assist by twisting while you reach-this can help to minimize stress on the shoulder and reduce the risk of a rotator cuff tear 4.   Swing your arms when you walk! People with PD are at increased risk for frozen shoulder and swinging your arms can reduce this risk. 5. When reaching for items like a cup, make sure to really extend fingers to prevent fingers from getting tight,

## 2017-05-25 NOTE — Therapy (Signed)
[facility] [address] [place], [place], [address] Phone: [phone]   Fax:  [phone]  Occupational Therapy Treatment  Patient Details  Name: Pamela Patton MRN: [id number] Date of Birth: [birth year] Referring Provider: Dr. Carles Collet  Encounter Date: 05/25/2017      OT End of Session - 05/25/17 1646    Visit Number 7   Number of Visits 11   Date for OT Re-Evaluation 06/08/17   Authorization Type UHC MCR   Authorization Time Period 60 days 7/17/.18-9/15/18   Authorization - Visit Number 7   Authorization - Number of Visits 10   OT Start Time 3220   OT Stop Time 1615   OT Time Calculation (min) 40 min   Activity Tolerance Patient tolerated treatment well   Behavior During Therapy [facility] for tasks assessed/performed      Past Medical History:  Diagnosis Date  . Anemia   . Arthritis   . Cataract    immature   . Chest pain summer 2012  . Chicken pox   . Diverticulosis    difficulty with colonoscopy 2005  . 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 Elyse Hsu 10/08/2012  . Fibroid    fibroid tumor  . Fracture    radial/ulnar 03/2016  . GERD (gastroesophageal reflux disease)   . Heart murmur    innocent murmur  . High cholesterol   . Hypertension   . Hypothyroid, s/p radioactive iodine tx for graves 2001 08/24/2012  . Intestinal metaplasia of gastric mucosa   . Kidney disease   . PD (Parkinson's disease) ([place]) 08/08/2016  . Renal cancer ([place])   . UTI (urinary tract infection)   . Weight loss    eval with GI in 2016; s/p CT/MRI and PET scans    Past Surgical History:  Procedure Laterality Date  . ABDOMINAL HYSTERECTOMY  1989  . COLONOSCOPY    . DG BARIUM SWALLOW ([employer] HX)     9 yrs ago per pt due to incomplete colonsocopy    There were no vitals filed for this visit.      Subjective Assessment - 05/25/17 1645    Subjective  Pt reports she feels better  after therapy   Pertinent History PD, left wrist fx   Patient Stated Goals coordination and strength in her hands   Currently in Pain? No/denies           Treatment: PWR! hands followed by tossing scarves to targets with dynamic step and reach min-mod v.c. For larger amplitude movements. Using boomwhackers to follow an sequence with bilateral UE's with emphasis on intensity and velocity of movements, mod v.c Educated pt regarding ways to prevent future PD related complications and community resources.                         OT Long Term Goals - 05/25/17 1651      OT LONG TERM GOAL #1   Title Pt will verbalize understanding of ways to prevent future PD related complications and verbalize understanding of community resources.   Time 5   Period Weeks   Status Achieved     OT LONG TERM GOAL #2   Title I with PD specific HEP following review   Time 5   Period Weeks   Status New     OT LONG TERM GOAL #3   Title Pt will demonstrate improved ease with dressing as evidenced by decreasing  PPT# 4 (don/ doff jacket) to 36 secs or less.   Baseline 40.16   Time 5   Period Weeks   Status New     OT LONG TERM GOAL #4   Title Pt will verbalize understanding of adapted strategies for ADLS/IADLS in order to increase safety and independence.   Time 5   Period Weeks   Status New     OT LONG TERM GOAL #5   Title Pt willdemonstrate increased ease with feeding as evidenced by decreasing PPT#2 (simulated feeding) to 16 secs or less.   Baseline 18.60 secs   Time 5   Period Weeks   Status New               Plan - 05/25/17 1641    Clinical Impression Statement Pt is progressing towards goals. She demonstrates improving carryover of larger amplitude movements with repetition.   Rehab Potential Good   OT Frequency 2x / week   OT Duration 8 weeks   OT Treatment/Interventions Self-care/ADL training;Therapeutic exercise;Cognitive  remediation/compensation;Visual/perceptual remediation/compensation;Splinting;Neuromuscular education;Moist Heat;Parrafin;Fluidtherapy;Energy conservation;Therapeutic exercises;Patient/family education;Balance training;Therapeutic activities;Passive range of motion;Manual Therapy;DME and/or AE instruction;Contrast Bath;Ultrasound;Cryotherapy   Plan review adapted strategies for feeding donning/ doffing jacket   OT Home Exercise Plan Reviewed PWR! seated and supine   Consulted and Agree with Plan of Care Patient      Patient will benefit from skilled therapeutic intervention in order to improve the following deficits and impairments:  Abnormal gait, Decreased cognition, Impaired vision/preception, Impaired flexibility, Decreased knowledge of use of DME, Decreased coordination, Decreased mobility, Impaired tone, Decreased strength, Decreased range of motion, Decreased endurance, Decreased activity tolerance, Decreased balance, Decreased knowledge of precautions, Difficulty walking, Impaired perceived functional ability, Impaired UE functional use  Visit Diagnosis: Other symptoms and signs involving the nervous system  Other symptoms and signs involving the musculoskeletal system  Other lack of coordination  Abnormal posture    Problem List Patient Active Problem List   Diagnosis Date Noted  . 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 Carotid artery disease ([place]) 03/21/2017  . Hypertension associated with diabetes ([place]) 02/22/2017  . Hyperlipidemia associated with type 2 diabetes mellitus ([place]) 02/22/2017  . PD (Parkinson's disease) ([place]) 08/08/2016  . Loss of weight 07/25/2016  . 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 Elyse Hsu 10/08/2012  . CKD (chronic kidney disease) stage 3, GFR 30-59 ml/min 10/08/2012  . Hypothyroid, s/p radioactive iodine tx for graves 2001 - managed by endocrinologist, Dr. Elyse Hsu 08/24/2012  . Vitamin D deficiency  08/24/2012    RINE,KATHRYN 05/25/2017, 4:51 PM  [facility] [address] [place], [place], [address] Phone: [phone]   Fax:  [phone]  Name: Pamela Patton MRN: [id number] Date of Birth: November 18, 1945

## 2017-05-25 NOTE — Therapy (Signed)
[facility] [address] [place], [place], [address] Phone: [phone]   Fax:  [phone]  Speech Language Pathology Treatment  Patient Details  Name: Pamela Patton MRN: [id number] Date of Birth: 11/19/1945 Referring Provider: [name], DO  Encounter Date: 05/25/2017      End of Session - 05/25/17 1640    Visit Number 6   Number of Visits 17   Date for SLP Re-Evaluation 06/26/17   SLP Start Time 1950   SLP Stop Time  1530   SLP Time Calculation (min) 43 min   Activity Tolerance Patient tolerated treatment well      Past Medical History:  Diagnosis Date  . Anemia   . Arthritis   . Cataract    immature   . Chest pain summer 2012  . Chicken pox   . Diverticulosis    difficulty with colonoscopy 2005  . 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 Elyse Hsu 10/08/2012  . Fibroid    fibroid tumor  . Fracture    radial/ulnar 03/2016  . GERD (gastroesophageal reflux disease)   . Heart murmur    innocent murmur  . High cholesterol   . Hypertension   . Hypothyroid, s/p radioactive iodine tx for graves 2001 08/24/2012  . Intestinal metaplasia of gastric mucosa   . Kidney disease   . PD (Parkinson's disease) ([place]) 08/08/2016  . Renal cancer ([place])   . UTI (urinary tract infection)   . Weight loss    eval with GI in 2016; s/p CT/MRI and PET scans    Past Surgical History:  Procedure Laterality Date  . ABDOMINAL HYSTERECTOMY  1989  . COLONOSCOPY    . DG BARIUM SWALLOW ([place] [facility])     9 yrs ago per pt due to incomplete colonsocopy    There were no vitals filed for this visit.      Subjective Assessment - 05/25/17 1455    Subjective "I really don't think I have (had people ask me to talk louder or repeat myself)" pt, re: SLP "Have people been asking you to repeat like they did before therapy started?"   Currently in Pain? No/denies                ADULT SLP TREATMENT - 05/25/17 1514      General Information   Behavior/Cognition Alert;Cooperative;Pleasant mood     Treatment Provided   Treatment provided Cognitive-Linquistic     Cognitive-Linquistic Treatment   Treatment focused on Dysarthria   Skilled Treatment SLP used loud /a/ to recalibrate pt's conversational volume to WNL, average was mid 90s dB. In 15 minutes mod complex conversation pt maintained WNL loudness and intelligibility. Outdoors, pt had difficulty maintaining arm swing and maintained intelligible volume in 10 minutes of mod complex conversation.     Assessment / Recommendations / Plan   Plan Continue with current plan of care     Progression Toward Goals   Progression toward goals Progressing toward goals            SLP Short Term Goals - 05/25/17 1459      SLP SHORT TERM GOAL #1   Title pt will demo appropriate breath support in 19/20 sentence responses over three sessions   Status Achieved     SLP SHORT TERM GOAL #2   Title pt will respond to structured speech tasks with sentences in a noisy environment with 100% intelligibility over three sessions   Period Weeks  Status Achieved     SLP SHORT TERM GOAL #3   Title pt will demo speech at low 70s dB average in 20 minutes mod complex conversation over three sessions   Status Achieved          SLP Long Term Goals - 05/25/17 1512      SLP LONG TERM GOAL #1   Title pt will produce 20 minutes mod complex-complex conversational speech with average low 70s dB over three sessions   Time 6   Period Weeks   Status Revised     SLP LONG TERM GOAL #2   Title pt will demo appropriate abdominal breath support necessary for intelligible speech in 20 minutes conversation   Status Achieved     SLP LONG TERM GOAL #3   Title pt will demo WFL volume speech outdoors with adequate focus on gait parameters   Time 6   Period Weeks   Status New          Plan - 05/25/17 1641    Clinical Impression  Statement Pt with excellent use of WNL volume inside with mod complex conversation over 15 minutes however outside when walking pt had difficulty maintaining appropriate gait parameters and maintaining intelligible voicing. Skilled ST remains needed for using WNL volume and multitasking with essential daily tasks.    Speech Therapy Frequency 2x / week   Treatment/Interventions Aspiration precaution training;Pharyngeal strengthening exercises;Diet toleration management by SLP;Cueing hierarchy;Internal/external aids;SLP instruction and feedback;Functional tasks;Patient/family education;Compensatory strategies;Oral motor exercises   Potential to Achieve Goals Good      Patient will benefit from skilled therapeutic intervention in order to improve the following deficits and impairments:   Dysarthria and anarthria    Problem List Patient Active Problem List   Diagnosis Date Noted  . 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 Carotid artery disease ([place]) 03/21/2017  . Hypertension associated with diabetes ([place]) 02/22/2017  . Hyperlipidemia associated with type 2 diabetes mellitus ([place]) 02/22/2017  . PD (Parkinson's disease) ([place]) 08/08/2016  . Loss of weight 07/25/2016  . 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 Elyse Hsu 10/08/2012  . CKD (chronic kidney disease) stage 3, GFR 30-59 ml/min 10/08/2012  . Hypothyroid, s/p radioactive iodine tx for graves 2001 - managed by endocrinologist, Dr. Elyse Hsu 08/24/2012  . Vitamin D deficiency 08/24/2012    [facility] ,MS, CCC-SLP  05/25/2017, 4:44 PM  [facility] [address] [place], [place], [address] Phone: [phone]   Fax:  [phone]   Name: Pamela Patton MRN: [id number] Date of Birth: July 18, 1946

## 2017-05-26 ENCOUNTER — Encounter: Payer: Self-pay | Admitting: Gastroenterology

## 2017-05-30 ENCOUNTER — Ambulatory Visit: Payer: Medicare Other | Admitting: Occupational Therapy

## 2017-05-30 ENCOUNTER — Ambulatory Visit: Payer: Medicare Other | Admitting: Physical Therapy

## 2017-05-30 ENCOUNTER — Ambulatory Visit: Payer: Medicare Other | Admitting: Speech Pathology

## 2017-05-30 DIAGNOSIS — R29818 Other symptoms and signs involving the nervous system: Secondary | ICD-10-CM

## 2017-05-30 DIAGNOSIS — R29898 Other symptoms and signs involving the musculoskeletal system: Secondary | ICD-10-CM

## 2017-05-30 DIAGNOSIS — R293 Abnormal posture: Secondary | ICD-10-CM

## 2017-05-30 DIAGNOSIS — R278 Other lack of coordination: Secondary | ICD-10-CM

## 2017-05-30 DIAGNOSIS — R2689 Other abnormalities of gait and mobility: Secondary | ICD-10-CM

## 2017-05-30 DIAGNOSIS — R471 Dysarthria and anarthria: Secondary | ICD-10-CM

## 2017-05-30 DIAGNOSIS — R2681 Unsteadiness on feet: Secondary | ICD-10-CM

## 2017-05-30 NOTE — Therapy (Signed)
[facility] [address] [place], [place], [address] Phone: [phone]   Fax:  [phone]  Physical Therapy Treatment  Patient Details  Name: Pamela Patton MRN: [id number] Date of Birth: 1946/03/08 Referring Provider: [name], DO  Encounter Date: 05/30/2017      PT End of Session - 05/30/17 1506    Visit Number 4   Number of Visits 6   Date for PT Re-Evaluation 06/18/17   Authorization Type [employer] Medicare-GCODE every 10th visit   PT Start Time 1103   PT Stop Time 1143   PT Time Calculation (min) 40 min   Activity Tolerance Patient tolerated treatment well   Behavior During Therapy [facility] for tasks assessed/performed      Past Medical History:  Diagnosis Date  . Anemia   . Arthritis   . Cataract    immature   . Chest pain summer 2012  . Chicken pox   . Diverticulosis    difficulty with colonoscopy 2005  . 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 Elyse Hsu 10/08/2012  . Fibroid    fibroid tumor  . Fracture    radial/ulnar 03/2016  . GERD (gastroesophageal reflux disease)   . Heart murmur    innocent murmur  . High cholesterol   . Hypertension   . Hypothyroid, s/p radioactive iodine tx for graves 2001 08/24/2012  . Intestinal metaplasia of gastric mucosa   . Kidney disease   . PD (Parkinson's disease) ([place]) 08/08/2016  . Renal cancer ([place])   . UTI (urinary tract infection)   . Weight loss    eval with GI in 2016; s/p CT/MRI and PET scans    Past Surgical History:  Procedure Laterality Date  . ABDOMINAL HYSTERECTOMY  1989  . COLONOSCOPY    . DG BARIUM SWALLOW ([place] [facility])     9 yrs ago per pt due to incomplete colonsocopy    There were no vitals filed for this visit.      Subjective Assessment - 05/30/17 1105    Subjective No changes, just trying to remember how to make all these exercises a habit.   Pertinent History DM, fracture L ulna/radius  03/2016, GERD, HTN   Patient Stated Goals Pt's goal for therapy is to work on improved coordination.   Currently in Pain? No/denies                         [facility] Adult PT Treatment/Exercise - 05/30/17 0001      Ambulation/Gait   Ambulation/Gait Yes   Ambulation/Gait Assistance 7: Independent   Ambulation/Gait Assistance Details Occasional cues for upright posture and reciprocal arm swing   Ambulation Distance (Feet) 548 Feet  in 3 minutes; then 350 ft with environmental scanning   Assistive device None   Gait Pattern Step-through pattern;Decreased arm swing - right;Decreased arm swing - left;Decreased stride length;Right flexed knee in stance;Left flexed knee in stance   Ambulation Surface Level;Indoor   Gait Comments Gait with environmental scanning tasks to find objects in gym area and to look at cards.  No LOB and slightly slowed gait speed noted with environmental scanning.           [facility]) - 05/30/17 1108    PWR! exercises Moves in sitting;Moves in standing   PWR! Up x 10 for posture, followed by sit<>stand then PWR! Up x 10 reps   PWR! Rock sit<>stand then [employer]! Rock x 10  reps   PWR! Twist sit<>stand then PWR! Twist x 10 reps   PWR Step sit<>stand then PWR! Step x 10 reps   Basic 4 Flow Standing PWR! Moves Basic 4 Flow, x 8 reps with visual cues of therapist followed by visual cues of handout   Comments Discussed and practiced ways to challenge and progress current PWR! Moves as part of Gray! Up x 20 for posture             PT Education - 05/30/17 1505    Education provided Yes   Education Details Progressions/updates for [employer]) Educated Patient   Methods Explanation;Demonstration;Handout   Comprehension Verbalized understanding;Returned demonstration          PT Short Term Goals - 09/01/16 0920      PT SHORT TERM GOAL #1   Title Pt will be independent with HEP for improved balance, posture, gait for improved functional  mobility.  TARGET 09/01/16   Status Achieved     PT SHORT TERM GOAL #2   Title Pt will perform at least 8 of 10 reps of sit<>stand transfers with minimal UE support with no posterior lean.   Status Achieved     PT SHORT TERM GOAL #3   Title Pt will improve single limb stance to at least 3 seconds bilateral lower extremities for improved step and obstacle negotiation.   Status Achieved           PT Long Term Goals - 05/16/17 2112      PT LONG TERM GOAL #1   Title Pt will be independent with progressive HEP, including walking program, to address Parkinson's related deficits.     Time 5   Period Weeks   Status New   Target Date 06/18/17  Updated target     PT LONG TERM GOAL #2   Title Pt will improve 3 minute walk test to at least 570 ft in 3 minute walk test for improved gait efficiency.   Time 5   Period Weeks   Status New   Target Date 06/18/17     PT LONG TERM GOAL #3   Title Pt will ambulate at least 1000 ft, indoor/outdoor surfaces, independently for improved dynamic gait.   Time 5   Period Weeks   Status New   Target Date 06/18/17     PT LONG TERM GOAL #4   Title Pt will verbalize plans for continued community fitness upon d/c from PT.   Time 5   Period Weeks   Status New   Target Date 06/18/17               Plan - 05/30/17 1508    Clinical Impression Statement Skilled PT session focused on progressions of current HEP (decided against adding addition stepping strategy exercises due to patient already having multiple exercises in HEP).  Pt is pleased with progressions and challenges to current HEP.  Pt feels comfortable with overall functional mobility and would like to d/c next PT visit.   Rehab Potential Good   PT Frequency 1x / week   PT Duration --  5 weeks plus eval   PT Treatment/Interventions ADLs/Self Care Home Management;Functional mobility training;Gait training;Patient/family education;Neuromuscular re-education;Balance training;Therapeutic  exercise;Therapeutic activities   PT Next Visit Plan Review HEP updates; check goals and plan for d/c   Consulted and Agree with Plan of Care Patient      Patient will benefit from skilled therapeutic intervention in order to improve  the following deficits and impairments:  Abnormal gait, Decreased balance, Decreased mobility, Difficulty walking, Postural dysfunction  Visit Diagnosis: Abnormal posture  Unsteadiness on feet  Other abnormalities of gait and mobility     Problem List Patient Active Problem List   Diagnosis Date Noted  . 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 Carotid artery disease ([place]) 03/21/2017  . Hypertension associated with diabetes ([place]) 02/22/2017  . Hyperlipidemia associated with type 2 diabetes mellitus ([place]) 02/22/2017  . PD (Parkinson's disease) ([place]) 08/08/2016  . Loss of weight 07/25/2016  . 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 Elyse Hsu 10/08/2012  . CKD (chronic kidney disease) stage 3, GFR 30-59 ml/min 10/08/2012  . Hypothyroid, s/p radioactive iodine tx for graves 2001 - managed by endocrinologist, Dr. Elyse Hsu 08/24/2012  . Vitamin D deficiency 08/24/2012    Pamela Ehrmann W. 05/30/2017, 3:11 PM Frazier Butt., PT [facility] [address] [place], [place], [address] Phone: [phone]   Fax:  [phone]  Name: Pamela Patton MRN: [id number] Date of Birth: 31-Dec-1945

## 2017-05-30 NOTE — Therapy (Signed)
[facility] [address] [place], [place], [address] Phone: [phone]   Fax:  [phone]  Speech Language Pathology Treatment  Patient Details  Name: Pamela Patton MRN: [id number] Date of Birth: December 01, 1945 Referring Provider: [name], DO  Encounter Date: 05/30/2017      End of Session - 05/30/17 1124    Visit Number 7   Number of Visits 17   Date for SLP Re-Evaluation 06/26/17   SLP Start Time 0932   SLP Stop Time  3220   SLP Time Calculation (min) 42 min   Activity Tolerance Patient tolerated treatment well      Past Medical History:  Diagnosis Date  . Anemia   . Arthritis   . Cataract    immature   . Chest pain summer 2012  . Chicken pox   . Diverticulosis    difficulty with colonoscopy 2005  . 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 Elyse Hsu 10/08/2012  . Fibroid    fibroid tumor  . Fracture    radial/ulnar 03/2016  . GERD (gastroesophageal reflux disease)   . Heart murmur    innocent murmur  . High cholesterol   . Hypertension   . Hypothyroid, s/p radioactive iodine tx for graves 2001 08/24/2012  . Intestinal metaplasia of gastric mucosa   . Kidney disease   . PD (Parkinson's disease) ([place]) 08/08/2016  . Renal cancer ([place])   . UTI (urinary tract infection)   . Weight loss    eval with GI in 2016; s/p CT/MRI and PET scans    Past Surgical History:  Procedure Laterality Date  . ABDOMINAL HYSTERECTOMY  1989  . COLONOSCOPY    . DG BARIUM SWALLOW ([place] [facility]X)     9 yrs ago per pt due to incomplete colonsocopy    There were no vitals filed for this visit.      Subjective Assessment - 05/30/17 0936    Subjective "I'm doing good - I've been busy"   Currently in Pain? No/denies               ADULT SLP TREATMENT - 05/30/17 0936      General Information   Behavior/Cognition Alert;Cooperative;Pleasant mood     Treatment Provided    Treatment provided Cognitive-Linquistic     Pain Assessment   Pain Assessment No/denies pain     Cognitive-Linquistic Treatment   Treatment focused on Dysarthria   Skilled Treatment Initial conversation prior to loud /a/ average 69dB with supervision cues. Loud /a/ to reclibrate loudness with mod I average of  95dB with mod I. after loud /a/, ,moderately complex converation averaged 72dB.Marland Kitchen Pt reported 3 phone calls after church and denied difficulty communicating and any requests for her to repeat herself. In moderately complex converation, pt ID'd when her volume decreased and self corrected it with supervision cues. Min cue to breathe deep to improve volume.  Divided attention between verbal abstract reasoning tasks (using photo cards) while maintaing volume with supervision cues, however, pt required cues to utilized reaching with UE extension to reach for reasoning cards instead of trunk flexion occasionally. (divided attention)     Assessment / Recommendations / Plan   Plan Continue with current plan of care     Progression Toward Goals   Progression toward goals Progressing toward goals          SLP Education - 05/30/17 1008    Education provided Yes   Education Details dividing  attention between volume, cognition and physical compensations (reaching, arm swing)   Person(s) Educated Patient   Methods Explanation;Demonstration   Comprehension Verbalized understanding;Returned demonstration          SLP Short Term Goals - 05/30/17 1122      SLP SHORT TERM GOAL #1   Title pt will demo appropriate breath support in 19/20 sentence responses over three sessions   Status Achieved     SLP SHORT TERM GOAL #2   Title pt will respond to structured speech tasks with sentences in a noisy environment with 100% intelligibility over three sessions   Period Weeks   Status Achieved     SLP SHORT TERM GOAL #3   Title pt will demo speech at low 70s dB average in 20 minutes mod complex  conversation over three sessions   Status Achieved          SLP Long Term Goals - 05/30/17 [place] #1   Title pt will produce 20 minutes mod complex-complex conversational speech with average low 70s dB over three sessions   Baseline 05/30/17;    Time 5   Period Weeks   Status On-going     SLP LONG TERM GOAL #2   Title pt will demo appropriate abdominal breath support necessary for intelligible speech in 20 minutes conversation   Status Achieved     SLP LONG TERM GOAL #3   Title pt will demo [facility] volume speech outdoors with adequate focus on gait parameters   Time 5   Period Weeks   Status On-going          Plan - 05/30/17 1122    Clinical Impression Statement Pt continues to maintain WNL volume over 15 minute conversation, however when cognitive task or physical task are introduced, pt requires rare min A for volume maintence and min cues for physical compensations. Continue skilled ST  for carrying over volume while multitasking with ADL's   Speech Therapy Frequency 2x / week   Treatment/Interventions Aspiration precaution training;Pharyngeal strengthening exercises;Diet toleration management by SLP;Cueing hierarchy;Internal/external aids;SLP instruction and feedback;Functional tasks;Patient/family education;Compensatory strategies;Oral motor exercises   Potential to Achieve Goals Good   Consulted and Agree with Plan of Care Patient      Patient will benefit from skilled therapeutic intervention in order to improve the following deficits and impairments:   Dysarthria and anarthria    Problem List Patient Active Problem List   Diagnosis Date Noted  . 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 Carotid artery disease ([place]) 03/21/2017  . Hypertension associated with diabetes ([place]) 02/22/2017  . Hyperlipidemia associated with type 2 diabetes mellitus ([place]) 02/22/2017  . PD (Parkinson's disease) ([place]) 08/08/2016  . Loss of weight 07/25/2016  . DM  (diabetes mellitus), type 2 with renal complications managed by endocrinologist, Dr. Elyse Hsu 10/08/2012  . CKD (chronic kidney disease) stage 3, GFR 30-59 ml/min 10/08/2012  . Hypothyroid, s/p radioactive iodine tx for graves 2001 - managed by endocrinologist, Dr. Elyse Hsu 08/24/2012  . Vitamin D deficiency 08/24/2012    [facility], Annye Rusk  MS, CCC-SLP 05/30/2017, 11:27 AM  [facility] [address] [place], [place], [address] Phone: [phone]   Fax:  [phone]   Name: Pamela Patton MRN: [id number] Date of Birth: 1946/03/02

## 2017-05-30 NOTE — Patient Instructions (Signed)
VARIATIONS OF YOUR PWR! MOVES:  1)  Standing PWR! Moves FLOW:  -PWR!Up>PWR! Rock>PWR! Twist>PWR! Step   -Repeat that sequence 5-10 times  2)  Sit to Stand followed by each PWR! Move  -Repeat 5-10 times with each  3) "Mini" Posture session  -Seated PWR! Up x 10-20  -Standing PWR! Up x 10-20  -Sit<>stand followed by PWR! Up x 10-20

## 2017-05-30 NOTE — Therapy (Signed)
[facility] [address] [place], [place], [address] Phone: [phone]   Fax:  [phone]  Occupational Therapy Treatment  Patient Details  Name: Pamela Patton MRN: [id number] Date of Birth: 05/07/1946 Referring Provider: Dr. Carles Collet  Encounter Date: 05/30/2017      OT End of Session - 05/30/17 1231    Visit Number 8   Number of Visits 11   Date for OT Re-Evaluation 06/08/17   Authorization Type UHC MCR   Authorization Time Period 60 days 7/17/.18-9/15/18   Authorization - Visit Number 8   Authorization - Number of Visits 10   OT Start Time 3704   OT Stop Time 1100   OT Time Calculation (min) 45 min   Activity Tolerance Patient tolerated treatment well      Past Medical History:  Diagnosis Date  . Anemia   . Arthritis   . Cataract    immature   . Chest pain summer 2012  . Chicken pox   . Diverticulosis    difficulty with colonoscopy 2005  . 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 Elyse Hsu 10/08/2012  . Fibroid    fibroid tumor  . Fracture    radial/ulnar 03/2016  . GERD (gastroesophageal reflux disease)   . Heart murmur    innocent murmur  . High cholesterol   . Hypertension   . Hypothyroid, s/p radioactive iodine tx for graves 2001 08/24/2012  . Intestinal metaplasia of gastric mucosa   . Kidney disease   . PD (Parkinson's disease) ([place]) 08/08/2016  . Renal cancer ([place])   . UTI (urinary tract infection)   . Weight loss    eval with GI in 2016; s/p CT/MRI and PET scans    Past Surgical History:  Procedure Laterality Date  . ABDOMINAL HYSTERECTOMY  1989  . COLONOSCOPY    . DG BARIUM SWALLOW ([place] HX)     9 yrs ago per pt due to incomplete colonsocopy    There were no vitals filed for this visit.      Subjective Assessment - 05/30/17 1020    Subjective  The review is always helpful   Pertinent History PD, left wrist fx   Patient Stated Goals  coordination and strength in her hands   Currently in Pain? No/denies            [facility] OT Assessment - 05/30/17 0001      Observation/Other Assessments   Simulated Eating Time (seconds) 17.47 sec   Donning Doffing Jacket Time (seconds) 31.87 sec                  OT Treatments/Exercises (OP) - 05/30/17 0001      ADLs   Eating Simulated eating and practiced use of adaptations previously recommended   UB Dressing Practiced donning/doffing jacket using large amplitude movement strategies x 5 reps   ADL Comments Reviewed large amplitude movement strategies for ADLS and further PD related complications. Assessed progress towards LTG's. Pt with greater understanding of strategies to incr. ease, safety, and independence with ADLS.            [facility]) - 05/30/17 1108    PWR! exercises Moves in sitting;Moves in standing   PWR! Up 10               OT Short Term Goals - 04/19/17 0830      OT SHORT TERM GOAL #3   Status --  OT SHORT TERM GOAL #4   Status --     OT SHORT TERM GOAL #5   Status --     OT SHORT TERM GOAL #6   Status --           OT Long Term Goals - 05/30/17 1223      OT LONG TERM GOAL #1   Title Pt will verbalize understanding of ways to prevent future PD related complications and verbalize understanding of community resources.   Time 5   Period Weeks   Status Achieved     OT LONG TERM GOAL #2   Title I with PD specific HEP following review   Time 5   Period Weeks   Status On-going     OT LONG TERM GOAL #3   Title Pt will demonstrate improved ease with dressing as evidenced by decreasing PPT# 4 (don/ doff jacket) to 36 secs or less.   Baseline 40.16   Time 5   Period Weeks   Status Achieved  05/30/17: 31.87 sec     OT LONG TERM GOAL #4   Title Pt will verbalize understanding of adapted strategies for ADLS/IADLS in order to increase safety and independence.   Time 5   Period Weeks   Status Achieved     OT LONG TERM  GOAL #5   Title Pt willdemonstrate increased ease with feeding as evidenced by decreasing PPT#2 (simulated feeding) to 16 secs or less.   Baseline 18.60 secs   Time 5   Period Weeks   Status Not Met  05/30/17: 17.47 sec               Plan - 05/30/17 1224    Clinical Impression Statement Pt progressing towards goals and has met goals #1, #3, and #4. Pt benefits from repetition of strategies and large amplitude movements.    Rehab Potential Good   OT Frequency 2x / week   OT Duration 8 weeks   OT Treatment/Interventions Self-care/ADL training;Therapeutic exercise;Cognitive remediation/compensation;Visual/perceptual remediation/compensation;Splinting;Neuromuscular education;Moist Heat;Parrafin;Fluidtherapy;Energy conservation;Therapeutic exercises;Patient/family education;Balance training;Therapeutic activities;Passive range of motion;Manual Therapy;DME and/or AE instruction;Contrast Bath;Ultrasound;Cryotherapy   Plan review PWR! Seated and supine, d/c   OT Home Exercise Plan Reviewed PWR! seated and supine   Consulted and Agree with Plan of Care Patient      Patient will benefit from skilled therapeutic intervention in order to improve the following deficits and impairments:  Abnormal gait, Decreased cognition, Impaired vision/preception, Impaired flexibility, Decreased knowledge of use of DME, Decreased coordination, Decreased mobility, Impaired tone, Decreased strength, Decreased range of motion, Decreased endurance, Decreased activity tolerance, Decreased balance, Decreased knowledge of precautions, Difficulty walking, Impaired perceived functional ability, Impaired UE functional use  Visit Diagnosis: Other symptoms and signs involving the nervous system  Other symptoms and signs involving the musculoskeletal system  Other lack of coordination  Abnormal posture    Problem List Patient Active Problem List   Diagnosis Date Noted  . 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  Carotid artery disease ([place]) 03/21/2017  . Hypertension associated with diabetes ([place]) 02/22/2017  . Hyperlipidemia associated with type 2 diabetes mellitus ([place]) 02/22/2017  . PD (Parkinson's disease) ([place]) 08/08/2016  . Loss of weight 07/25/2016  . 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 Elyse Hsu 10/08/2012  . CKD (chronic kidney disease) stage 3, GFR 30-59 ml/min 10/08/2012  . Hypothyroid, s/p radioactive iodine tx for graves 2001 - managed by endocrinologist, Dr. Elyse Hsu 08/24/2012  . Vitamin D deficiency 08/24/2012  Carey Bullocks, OTR/L 05/30/2017, 12:32 PM  [facility] [address] [place], [place], [address] Phone: [phone]   Fax:  [phone]  Name: BABETTA PATERSON MRN: [id number] Date of Birth: 08-15-1946

## 2017-06-01 ENCOUNTER — Ambulatory Visit: Payer: Medicare Other | Admitting: Occupational Therapy

## 2017-06-01 ENCOUNTER — Ambulatory Visit: Payer: Medicare Other

## 2017-06-01 DIAGNOSIS — R1310 Dysphagia, unspecified: Secondary | ICD-10-CM

## 2017-06-01 DIAGNOSIS — R29898 Other symptoms and signs involving the musculoskeletal system: Secondary | ICD-10-CM

## 2017-06-01 DIAGNOSIS — R2681 Unsteadiness on feet: Secondary | ICD-10-CM

## 2017-06-01 DIAGNOSIS — R293 Abnormal posture: Secondary | ICD-10-CM

## 2017-06-01 DIAGNOSIS — R278 Other lack of coordination: Secondary | ICD-10-CM

## 2017-06-01 DIAGNOSIS — R471 Dysarthria and anarthria: Secondary | ICD-10-CM

## 2017-06-01 DIAGNOSIS — R29818 Other symptoms and signs involving the nervous system: Secondary | ICD-10-CM

## 2017-06-01 DIAGNOSIS — R2689 Other abnormalities of gait and mobility: Secondary | ICD-10-CM

## 2017-06-01 NOTE — Therapy (Signed)
[facility] [address] [place], [place], [address] Phone: [phone]   Fax:  [phone]  Occupational Therapy Treatment  Patient Details  Name: Pamela Patton MRN: [id number] Date of Birth: 12-Nov-1945 Referring Provider: Dr. Carles Collet  Encounter Date: 06/01/2017      OT End of Session - 06/01/17 1549    Visit Number 9   Number of Visits 11   Date for OT Re-Evaluation 06/08/17   Authorization Type UHC MCR   Authorization Time Period 60 days 7/17/.18-9/15/18   Authorization - Visit Number 9   Authorization - Number of Visits 10   OT Start Time 1540   OT Stop Time 1618   OT Time Calculation (min) 38 min   Activity Tolerance Patient tolerated treatment well   Behavior During Therapy [facility] for tasks assessed/performed      Past Medical History:  Diagnosis Date  . Anemia   . Arthritis   . Cataract    immature   . Chest pain summer 2012  . Chicken pox   . Diverticulosis    difficulty with colonoscopy 2005  . 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 Elyse Hsu 10/08/2012  . Fibroid    fibroid tumor  . Fracture    radial/ulnar 03/2016  . GERD (gastroesophageal reflux disease)   . Heart murmur    innocent murmur  . High cholesterol   . Hypertension   . Hypothyroid, s/p radioactive iodine tx for graves 2001 08/24/2012  . Intestinal metaplasia of gastric mucosa   . Kidney disease   . PD (Parkinson's disease) ([place]) 08/08/2016  . Renal cancer ([place])   . UTI (urinary tract infection)   . Weight loss    eval with GI in 2016; s/p CT/MRI and PET scans    Past Surgical History:  Procedure Laterality Date  . ABDOMINAL HYSTERECTOMY  1989  . COLONOSCOPY    . DG BARIUM SWALLOW ([place] HX)     9 yrs ago per pt due to incomplete colonsocopy    There were no vitals filed for this visit.      Subjective Assessment - 06/01/17 1547    Subjective  I have been focusing on my hand    Pertinent History PD, left wrist fx   Patient Stated Goals coordination and strength in her hands   Currently in Pain? No/denies      Reviewed coordination exercises as requested including dealing cards with thumb and flipping cards with both hands with min v.c. For R hand for incr movement amplitude.  Reviewed ways to prevent future complications.  Discussed/recommended OT screen/re-evaluation in approx 6 months.  Pt verbalized agreement.  Reviewed PWR! Moves (basic 4) in sitting and supine and pt returned demo each with min v.c. For incr effort/movement amplitude.                            OT Short Term Goals - 04/19/17 0830      OT SHORT TERM GOAL #3   Status --     OT SHORT TERM GOAL #4   Status --     OT SHORT TERM GOAL #5   Status --     OT SHORT TERM GOAL #6   Status --           OT Long Term Goals - 06/01/17 1550      OT LONG TERM GOAL #1   Title Pt  will verbalize understanding of ways to prevent future PD related complications and verbalize understanding of community resources.   Time 5   Period Weeks   Status Achieved     OT LONG TERM GOAL #2   Title I with PD specific HEP following review   Time 5   Period Weeks   Status Achieved     OT LONG TERM GOAL #3   Title Pt will demonstrate improved ease with dressing as evidenced by decreasing PPT# 4 (don/ doff jacket) to 36 secs or less.   Baseline 40.16   Time 5   Period Weeks   Status Achieved  05/30/17: 31.87 sec     OT LONG TERM GOAL #4   Title Pt will verbalize understanding of adapted strategies for ADLS/IADLS in order to increase safety and independence.   Time 5   Period Weeks   Status Achieved     OT LONG TERM GOAL #5   Title Pt willdemonstrate increased ease with feeding as evidenced by decreasing PPT#2 (simulated feeding) to 16 secs or less.   Baseline 18.60 secs   Time 5   Period Weeks   Status Not Met  05/30/17: 17.47 sec               Plan -  06-23-2017 1550    Clinical Impression Statement Pt has made good progress towards goals and is appropriate for d/c.   Rehab Potential Good   OT Frequency 2x / week   OT Duration 8 weeks   OT Treatment/Interventions Self-care/ADL training;Therapeutic exercise;Cognitive remediation/compensation;Visual/perceptual remediation/compensation;Splinting;Neuromuscular education;Moist Heat;Parrafin;Fluidtherapy;Energy conservation;Therapeutic exercises;Patient/family education;Balance training;Therapeutic activities;Passive range of motion;Manual Therapy;DME and/or AE instruction;Contrast Bath;Ultrasound;Cryotherapy   Plan d/c OT; recommend OT re-eval/screen in approx 6 months   OT Home Exercise Plan Reviewed PWR! seated and supine   Consulted and Agree with Plan of Care Patient      Patient will benefit from skilled therapeutic intervention in order to improve the following deficits and impairments:  Abnormal gait, Decreased cognition, Impaired vision/preception, Impaired flexibility, Decreased knowledge of use of DME, Decreased coordination, Decreased mobility, Impaired tone, Decreased strength, Decreased range of motion, Decreased endurance, Decreased activity tolerance, Decreased balance, Decreased knowledge of precautions, Difficulty walking, Impaired perceived functional ability, Impaired UE functional use  Visit Diagnosis: Other symptoms and signs involving the nervous system  Other symptoms and signs involving the musculoskeletal system  Other lack of coordination  Abnormal posture  Unsteadiness on feet  Other abnormalities of gait and mobility      G-Codes - Jun 23, 2017 1746    Functional Assessment Tool Used (Outpatient only) PPT#4 :31.87, PPT#2: 17.47   Functional Limitation Self care   Self Care Goal Status (Q3335) At least 1 percent but less than 20 percent impaired, limited or restricted   Self Care Discharge Status [phone]) At least 1 percent but less than 20 percent impaired, limited  or restricted      Problem List Patient Active Problem List   Diagnosis Date Noted  . 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 Carotid artery disease ([place]) 03/21/2017  . Hypertension associated with diabetes ([place]) 02/22/2017  . Hyperlipidemia associated with type 2 diabetes mellitus ([place]) 02/22/2017  . PD (Parkinson's disease) ([place]) 08/08/2016  . Loss of weight 07/25/2016  . 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 Elyse Hsu 10/08/2012  . CKD (chronic kidney disease) stage 3, GFR 30-59 ml/min 10/08/2012  . Hypothyroid, s/p radioactive iodine tx for graves 2001 - managed by endocrinologist, Dr. Elyse Hsu  08/24/2012  . Vitamin D deficiency 08/24/2012    OCCUPATIONAL THERAPY DISCHARGE SUMMARY  Visits from Start of Care: 9  Current functional level related to goals / functional outcomes: See above   Remaining deficits: Bradykinesia, rigidity, decr posture, decr coordination, decr balance/functional mobility   Education / Equipment: Pt educated in adaptive strategies for ADLs, updated HEP, ways to prevent future complications, appropriate community resources.  Pt verbalized understanding of all education provided.  Plan: Patient agrees to discharge.  Patient goals were partially met. Patient is being discharged due to being pleased with the current functional level.  ?????Pt would benefit from re-evaluation/occupational therapy screen in approx 6 months to assess for need for further therapy/functional changes due to progressive nature of diagnosis.  Pt in agreement with plan.         [facility] 06/01/2017, 5:51 PM  [facility] [address] [place], [place], [address] Phone: [phone]   Fax:  [phone]  Name: TASHA DIAZ MRN: [id number] Date of Birth: 05/30/1946   Vianne Bulls, OTR/L [facility] [address] [place], [place]  [address] [phone] phone [phone] 06/01/17 5:51 PM

## 2017-06-01 NOTE — Therapy (Signed)
[facility] [address] [place], [place], [address] Phone: [phone]   Fax:  [phone]  Speech Language Pathology Treatment  Patient Details  Name: Pamela Patton MRN: [id number] Date of Birth: [birth year] Referring Provider: [name], DO  Encounter Date: 06/01/2017      End of Session - 06/01/17 1723    Visit Number 8   Number of Visits 17   Date for SLP Re-Evaluation 06/26/17   SLP Start Time 1449   SLP Stop Time  1530   SLP Time Calculation (min) 41 min   Activity Tolerance Patient tolerated treatment well      Past Medical History:  Diagnosis Date  . Anemia   . Arthritis   . Cataract    immature   . Chest pain summer 2012  . Chicken pox   . Diverticulosis    difficulty with colonoscopy 2005  . 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 Elyse Hsu 10/08/2012  . Fibroid    fibroid tumor  . Fracture    radial/ulnar 03/2016  . GERD (gastroesophageal reflux disease)   . Heart murmur    innocent murmur  . High cholesterol   . Hypertension   . Hypothyroid, s/p radioactive iodine tx for graves 2001 08/24/2012  . Intestinal metaplasia of gastric mucosa   . Kidney disease   . PD (Parkinson's disease) ([place]) 08/08/2016  . Renal cancer ([place])   . UTI (urinary tract infection)   . Weight loss    eval with GI in 2016; s/p CT/MRI and PET scans    Past Surgical History:  Procedure Laterality Date  . ABDOMINAL HYSTERECTOMY  1989  . COLONOSCOPY    . DG BARIUM SWALLOW ([place] HX)     9 yrs ago per pt due to incomplete colonsocopy    There were no vitals filed for this visit.      Subjective Assessment - 06/01/17 1446    Subjective "Maybe I'll think of Richard Pryor yelling." (in order to get proper pitch for loud /a/)   Currently in Pain? No/denies               ADULT SLP TREATMENT - 06/01/17 1505      General Information   Behavior/Cognition  Alert;Pleasant mood;Cooperative     Treatment Provided   Treatment provided Cognitive-Linquistic     Cognitive-Linquistic Treatment   Treatment focused on Dysarthria   Skilled Treatment Initial conversation for 5 minutes prior to loud /a/ was WNL volume average with supervision cues. Loud /a/ to reclibrate conversational loudness completed with average of  mid 90s dB. After loud /a/, 20 minutes moderately complex/complex converation averaged in the low 70s dB. Outside, pt req'd usual faded to occasional reminders for arm swing while using louder volume (divided attention).     Assessment / Recommendations / Plan   Plan Continue with current plan of care     Progression Toward Goals   Progression toward goals Progressing toward goals            SLP Short Term Goals - 06/01/17 1725      SLP SHORT TERM GOAL #1   Title pt will demo appropriate breath support in 19/20 sentence responses over three sessions   Status Achieved     SLP SHORT TERM GOAL #2   Title pt will respond to structured speech tasks with sentences in a noisy environment with 100% intelligibility over three sessions   Status Achieved  SLP SHORT TERM GOAL #3   Title pt will demo speech at low 70s dB average in 20 minutes mod complex conversation over three sessions   Status Achieved          SLP Long Term Goals - 06/01/17 [place] #1   Title pt will produce 20 minutes mod complex-complex conversational speech with average low 70s dB over three sessions   Baseline 05/30/17; 06-01-17   Time 5   Period Weeks   Status On-going     SLP LONG TERM GOAL #2   Title pt will demo appropriate abdominal breath support necessary for intelligible speech in 20 minutes conversation   Status Achieved     SLP LONG TERM GOAL #3   Title pt will demo [facility] volume speech outdoors with adequate focus on gait parameters   Time 5   Period Weeks   Status On-going          Plan - 06/01/17 1724     Clinical Impression Statement Pt with excellent use of WNL volume inside with complex conversation lasting approx 15 minutes however outside when walking and conversing in mod complex conversation (divided attention) pt req'd fading SLP cues to maintain appropriate gait parameters (arm swing) when maintaining intelligible voicing. Skilled ST remains needed for approx 1-2 more weeks using WNL volume and multitasking with essential daily tasks.    Speech Therapy Frequency 2x / week   Treatment/Interventions Aspiration precaution training;Pharyngeal strengthening exercises;Diet toleration management by SLP;Cueing hierarchy;Internal/external aids;SLP instruction and feedback;Functional tasks;Patient/family education;Compensatory strategies;Oral motor exercises   Potential to Achieve Goals Good      Patient will benefit from skilled therapeutic intervention in order to improve the following deficits and impairments:   Dysarthria and anarthria  Dysphagia, unspecified type    Problem List Patient Active Problem List   Diagnosis Date Noted  . 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 Carotid artery disease ([place]) 03/21/2017  . Hypertension associated with diabetes ([place]) 02/22/2017  . Hyperlipidemia associated with type 2 diabetes mellitus ([place]) 02/22/2017  . PD (Parkinson's disease) ([place]) 08/08/2016  . Loss of weight 07/25/2016  . 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 Elyse Hsu 10/08/2012  . CKD (chronic kidney disease) stage 3, GFR 30-59 ml/min 10/08/2012  . Hypothyroid, s/p radioactive iodine tx for graves 2001 - managed by endocrinologist, Dr. Elyse Hsu 08/24/2012  . Vitamin D deficiency 08/24/2012    [facility] ,MS, CCC-SLP  06/01/2017, 5:27 PM  [facility] [address] [place], [place], [address] Phone: [phone]   Fax:  [phone]   Name: KARINE GARN MRN:  [id number] Date of Birth: 04-02-1946

## 2017-06-06 ENCOUNTER — Ambulatory Visit: Payer: Medicare Other | Attending: Neurology

## 2017-06-06 ENCOUNTER — Ambulatory Visit: Payer: Medicare Other

## 2017-06-06 DIAGNOSIS — R293 Abnormal posture: Secondary | ICD-10-CM

## 2017-06-06 DIAGNOSIS — R1310 Dysphagia, unspecified: Secondary | ICD-10-CM | POA: Diagnosis present

## 2017-06-06 DIAGNOSIS — R471 Dysarthria and anarthria: Secondary | ICD-10-CM | POA: Insufficient documentation

## 2017-06-06 DIAGNOSIS — R278 Other lack of coordination: Secondary | ICD-10-CM

## 2017-06-06 DIAGNOSIS — R2681 Unsteadiness on feet: Secondary | ICD-10-CM | POA: Diagnosis present

## 2017-06-06 NOTE — Therapy (Signed)
[facility] [address] [place], [place], [address] Phone: [phone]   Fax:  [phone]  Physical Therapy Treatment  Patient Details  Name: Pamela Patton MRN: [id number] Date of Birth: 1946/08/02 Referring Provider: [name], DO  Encounter Date: 06/06/2017      PT End of Session - 06/06/17 1002    Visit Number 5   Number of Visits 6   Date for PT Re-Evaluation 06/18/17   Authorization Type [employer] Medicare-GCODE every 10th visit   PT Start Time 0932   PT Stop Time 0956   PT Time Calculation (min) 24 min   Activity Tolerance Patient tolerated treatment well   Behavior During Therapy [facility] for tasks assessed/performed      Past Medical History:  Diagnosis Date  . Anemia   . Arthritis   . Cataract    immature   . Chest pain summer 2012  . Chicken pox   . Diverticulosis    difficulty with colonoscopy 2005  . 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 Elyse Hsu 10/08/2012  . Fibroid    fibroid tumor  . Fracture    radial/ulnar 03/2016  . GERD (gastroesophageal reflux disease)   . Heart murmur    innocent murmur  . High cholesterol   . Hypertension   . Hypothyroid, s/p radioactive iodine tx for graves 2001 08/24/2012  . Intestinal metaplasia of gastric mucosa   . Kidney disease   . PD (Parkinson's disease) ([place]) 08/08/2016  . Renal cancer ([place])   . UTI (urinary tract infection)   . Weight loss    eval with GI in 2016; s/p CT/MRI and PET scans    Past Surgical History:  Procedure Laterality Date  . ABDOMINAL HYSTERECTOMY  1989  . COLONOSCOPY    . DG BARIUM SWALLOW ([place] HX)     9 yrs ago per pt due to incomplete colonsocopy    There were no vitals filed for this visit.      Subjective Assessment - 06/06/17 0933    Subjective Pt denied changes or falls since last visit. Pt reported she has a hard time fitting exercises into her everyday routine. Pt  plans to continue community fitness after d/c from PT.    Pertinent History DM, fracture L ulna/radius 03/2016, GERD, HTN   Patient Stated Goals Pt's goal for therapy is to work on improved coordination.   Currently in Pain? No/denies                         [facility] Adult PT Treatment/Exercise - 06/06/17 0936      Ambulation/Gait   Ambulation/Gait Yes   Ambulation/Gait Assistance 7: Independent   Ambulation/Gait Assistance Details Pt amb. during 3 minute walk test (670') over even terrain and then an additional 400' over paved surfaces without LOB. Pt demonstrated improved reciprocal arm swing and upright posture, as she reported speech therapist discussed correct posture prior to PT appt.   Ambulation Distance (Feet) 1070 Feet   Assistive device None   Gait Pattern Step-through pattern;Decreased arm swing - right;Decreased arm swing - left;Decreased stride length;Right flexed knee in stance;Left flexed knee in stance   Ambulation Surface Level;Unlevel;Indoor;Outdoor;Paved           PWR [facility]) - 06/06/17 1001    Comments Pt performed 2-5 reps of each seated PWR! HEP to ensure correct technique. Pt demonstarted safe and proper technique. PT reviewed other HEP with  pt and pt was able to demo and verbalize understanding and safety.             PT Education - 06/15/2017 0948    Education provided Yes   Education Details PT discussed outcome measure results and goal progress. PT also discussed the importance of performing HEP as prescribed, pt demonstrated seated PWR! exercises with proper technique.    Person(s) Educated Patient   Methods Explanation   Comprehension Verbalized understanding          PT Short Term Goals - 09/01/16 0920      PT SHORT TERM GOAL #1   Title Pt will be independent with HEP for improved balance, posture, gait for improved functional mobility.  TARGET 09/01/16   Status Achieved     PT SHORT TERM GOAL #2   Title Pt will perform at  least 8 of 10 reps of sit<>stand transfers with minimal UE support with no posterior lean.   Status Achieved     PT SHORT TERM GOAL #3   Title Pt will improve single limb stance to at least 3 seconds bilateral lower extremities for improved step and obstacle negotiation.   Status Achieved           PT Long Term Goals - 06-15-2017 1003      PT LONG TERM GOAL #1   Title Pt will be independent with progressive HEP, including walking program, to address Parkinson's related deficits.     Time 5   Period Weeks   Status Achieved     PT LONG TERM GOAL #2   Title Pt will improve 3 minute walk test to at least 570 ft in 3 minute walk test for improved gait efficiency.   Time 5   Period Weeks   Status Achieved     PT LONG TERM GOAL #3   Title Pt will ambulate at least 1000 ft, indoor/outdoor surfaces, independently for improved dynamic gait.   Time 5   Period Weeks   Status Achieved     PT LONG TERM GOAL #4   Title Pt will verbalize plans for continued community fitness upon d/c from PT.   Time 5   Period Weeks   Status Achieved               Plan - 06-15-17 1002    Clinical Impression Statement Pt demonstrated progress, as she met all LTGs. Please see d/c summary for details.   Rehab Potential Good   PT Frequency 1x / week   PT Duration --  5 weeks plus eval   PT Treatment/Interventions ADLs/Self Care Home Management;Functional mobility training;Gait training;Patient/family education;Neuromuscular re-education;Balance training;Therapeutic exercise;Therapeutic activities   PT Next Visit Plan Review HEP updates; check goals and plan for d/c   Consulted and Agree with Plan of Care Patient      Patient will benefit from skilled therapeutic intervention in order to improve the following deficits and impairments:  Abnormal gait, Decreased balance, Decreased mobility, Difficulty walking, Postural dysfunction  Visit Diagnosis: Other lack of coordination  Unsteadiness on  feet  Abnormal posture       G-Codes - 06/15/17 1003    Functional Assessment Tool Used (Outpatient Only) gait 670 ft in 3 minutes   Functional Limitation Mobility: Walking and moving around   Mobility: Walking and Moving Around Goal Status [phone]) At least 1 percent but less than 20 percent impaired, limited or restricted   Mobility: Walking and Moving Around Discharge Status [phone]) At least 1  percent but less than 20 percent impaired, limited or restricted      Problem List Patient Active Problem List   Diagnosis Date Noted  . 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 Carotid artery disease ([place]) 03/21/2017  . Hypertension associated with diabetes ([place]) 02/22/2017  . Hyperlipidemia associated with type 2 diabetes mellitus ([place]) 02/22/2017  . PD (Parkinson's disease) ([place]) 08/08/2016  . Loss of weight 07/25/2016  . 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 Elyse Hsu 10/08/2012  . CKD (chronic kidney disease) stage 3, GFR 30-59 ml/min 10/08/2012  . Hypothyroid, s/p radioactive iodine tx for graves 2001 - managed by endocrinologist, Dr. Elyse Hsu 08/24/2012  . Vitamin D deficiency 08/24/2012    Waldemar Siegel L 06/06/2017, 10:04 AM  [facility] [address] [place], [place], [address] Phone: [phone]   Fax:  [phone]  Name: Pamela Patton MRN: [id number] Date of Birth: [birth year]  PHYSICAL THERAPY DISCHARGE SUMMARY  Visits from Start of Care: 5  Current functional level related to goals / functional outcomes:     PT Long Term Goals - 06/06/17 1003      PT LONG TERM GOAL #1   Title Pt will be independent with progressive HEP, including walking program, to address Parkinson's related deficits.     Time 5   Period Weeks   Status Achieved     PT LONG TERM GOAL #2   Title Pt will improve 3 minute walk test to at least 570 ft in 3 minute walk test for  improved gait efficiency.   Time 5   Period Weeks   Status Achieved     PT LONG TERM GOAL #3   Title Pt will ambulate at least 1000 ft, indoor/outdoor surfaces, independently for improved dynamic gait.   Time 5   Period Weeks   Status Achieved     PT LONG TERM GOAL #4   Title Pt will verbalize plans for continued community fitness upon d/c from PT.   Time 5   Period Weeks   Status Achieved        Remaining deficits: Decr. Speed and arm swing    Education / Equipment: HEP and plans to continue community fitness.  Plan: Patient agrees to discharge.  Patient goals were met. Patient is being discharged due to meeting the stated rehab goals.  ?????        Geoffry Paradise, PT,DPT 06/06/17 10:06 AM Phone: [phone] Fax: [phone]

## 2017-06-06 NOTE — Patient Instructions (Signed)
Continue to perform loud "ah" twice every day.

## 2017-06-06 NOTE — Therapy (Signed)
[facility] [address] [place], [place], [address] Phone: [phone]   Fax:  [phone]  Speech Language Pathology Treatment  Patient Details  Name: Pamela Patton MRN: [id number] Date of Birth: [birth year] Referring Provider: [name], DO  Encounter Date: 06/06/2017      End of Session - 06/06/17 1223    Visit Number 9   Number of Visits 17   Date for SLP Re-Evaluation 06/26/17   SLP Start Time 0850   SLP Stop Time  0930   SLP Time Calculation (min) 40 min   Activity Tolerance Patient tolerated treatment well      Past Medical History:  Diagnosis Date  . Anemia   . Arthritis   . Cataract    immature   . Chest pain summer 2012  . Chicken pox   . Diverticulosis    difficulty with colonoscopy 2005  . 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 Elyse Hsu 10/08/2012  . Fibroid    fibroid tumor  . Fracture    radial/ulnar 03/2016  . GERD (gastroesophageal reflux disease)   . Heart murmur    innocent murmur  . High cholesterol   . Hypertension   . Hypothyroid, s/p radioactive iodine tx for graves 2001 08/24/2012  . Intestinal metaplasia of gastric mucosa   . Kidney disease   . PD (Parkinson's disease) ([place]) 08/08/2016  . Renal cancer ([place])   . UTI (urinary tract infection)   . Weight loss    eval with GI in 2016; s/p CT/MRI and PET scans    Past Surgical History:  Procedure Laterality Date  . ABDOMINAL HYSTERECTOMY  1989  . COLONOSCOPY    . DG BARIUM SWALLOW ([place] [facility]X)     9 yrs ago per pt due to incomplete colonsocopy    There were no vitals filed for this visit.      Subjective Assessment - 06/06/17 0909    Subjective "I watched the tennis last night."   Currently in Pain? No/denies               ADULT SLP TREATMENT - 06/06/17 0910      General Information   Behavior/Cognition Alert;Pleasant mood;Cooperative     Treatment Provided    Treatment provided Cognitive-Linquistic     Cognitive-Linquistic Treatment   Treatment focused on Dysarthria   Skilled Treatment Initial conversation for 10 minutes prior to loud /a/ was WNL average volume. Loud /a/ to reclibrate conversational loudness completed with average of  mid 90s dB. After loud /a/, 20 minutes moderately complex/complex converation averaged in the low 70s dB. Outside, pt req'd usual faded to occasional reminders for arm swing while using louder volume (divided attention).     Assessment / Recommendations / Plan   Plan Discharge SLP treatment due to (comment)  goals met; pt satisfied with progress     Progression Toward Goals   Progression toward goals Goals met, education completed, patient discharged from [facility] - 06/01/17 [place] #1   Title pt will demo appropriate breath support in 19/20 sentence responses over three sessions   Status Achieved     SLP SHORT TERM GOAL #2   Title pt will respond to structured speech tasks with sentences in a noisy environment with 100% intelligibility over three sessions   Status Achieved  SLP SHORT TERM GOAL #3   Title pt will demo speech at low 70s dB average in 20 minutes mod complex conversation over three sessions   Status Achieved          SLP Long Term Goals - 07/04/2017 1225      SLP LONG TERM GOAL #1   Title pt will produce 20 minutes mod complex-complex conversational speech with average low 70s dB over three sessions   Status Achieved     SLP LONG TERM GOAL #2   Title pt will demo appropriate abdominal breath support necessary for intelligible speech in 20 minutes conversation   Status Achieved     SLP LONG TERM GOAL #3   Title pt will demo [facility] volume speech outdoors with adequate focus on gait parameters   Status Partially Met          Plan - 2017/07/04 1223    Clinical Impression Statement Pt with excellent use of WNL volume inside with  complex conversation lasting approx 15 minutes however outside when walking and conversing in mod complex conversation (divided attention) pt req'd occasional min cues fading to supervision cues to maintain appropriate gait parameters (arm swing) when maintaining intelligible voicing. Pt is appropirate for d/c today and agrees with this. ltitasking with essential daily tasks.    Treatment/Interventions Aspiration precaution training;Pharyngeal strengthening exercises;Diet toleration management by SLP;Cueing hierarchy;Internal/external aids;SLP instruction and feedback;Functional tasks;Patient/family education;Compensatory strategies;Oral motor exercises   Potential to Achieve Goals Good      Patient will benefit from skilled therapeutic intervention in order to improve the following deficits and impairments:   Dysarthria and anarthria  Dysphagia, unspecified type      G-Codes - 07/04/17 1229    Functional Assessment Tool Used NOMS   Functional Limitations Motor speech   Motor Speech Goal Status [phone]) At least 1 percent but less than 20 percent impaired, limited or restricted   Motor Speech discharge Status ([id number]) At least 1 percent but less than 20 percent impaired, limited or restricted     [place]  Visits from Start of Care: 9  Current functional level related to goals / functional outcomes: See goal section above. Pt met all STGs and 2/3 LTGs. Goal #3 was partially met as pt cont'd to require supervision cues for arm swing and maintaining WNL volume.   Remaining deficits: Pt is functional with WNL voice volume when not multi-tasking with another activity, and requires supervision cues with another task.   Education / Equipment: Need for loud "ah" daily.  Plan: Patient agrees to discharge.  Patient goals were partially met. Patient is being discharged due to meeting the stated rehab goals.  ?????       Problem List Patient Active Problem List    Diagnosis Date Noted  . 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 Carotid artery disease ([place]) 03/21/2017  . Hypertension associated with diabetes ([place]) 02/22/2017  . Hyperlipidemia associated with type 2 diabetes mellitus ([place]) 02/22/2017  . PD (Parkinson's disease) ([place]) 08/08/2016  . Loss of weight 07/25/2016  . 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 Elyse Hsu 10/08/2012  . CKD (chronic kidney disease) stage 3, GFR 30-59 ml/min 10/08/2012  . Hypothyroid, s/p radioactive iodine tx for graves 2001 - managed by endocrinologist, Dr. Elyse Hsu 08/24/2012  . Vitamin D deficiency 08/24/2012    [facility] ,MS, CCC-SLP  07/04/17, 12:29 PM  [facility] [address] [place], [place], [address]  Phone: [phone]   Fax:  [phone]   Name: Pamela Patton MRN: [id number] Date of Birth: 1945/12/19

## 2017-06-13 ENCOUNTER — Telehealth: Payer: Self-pay | Admitting: *Deleted

## 2017-06-13 NOTE — Telephone Encounter (Signed)
I called the pt and gave her the phone [facility].

## 2017-06-13 NOTE — Telephone Encounter (Signed)
-----   Message from Lucretia Kern, DO sent at 06/11/2017  8:56 PM EDT ----- Did she still want to do bone density susan ordered for her? Ok to reorder if so. Thanks.

## 2017-06-21 ENCOUNTER — Other Ambulatory Visit: Payer: Self-pay | Admitting: Family Medicine

## 2017-06-21 DIAGNOSIS — E2839 Other primary ovarian failure: Secondary | ICD-10-CM

## 2017-06-22 ENCOUNTER — Encounter: Payer: Self-pay | Admitting: Family Medicine

## 2017-07-17 ENCOUNTER — Encounter: Payer: Self-pay | Admitting: Family Medicine

## 2017-07-17 ENCOUNTER — Ambulatory Visit (INDEPENDENT_AMBULATORY_CARE_PROVIDER_SITE_OTHER): Payer: Medicare Other | Admitting: Family Medicine

## 2017-07-17 VITALS — BP 112/60 | HR 75 | Temp 98.6°F | Ht 66.0 in | Wt 141.7 lb

## 2017-07-17 DIAGNOSIS — E1159 Type 2 diabetes mellitus with other circulatory complications: Secondary | ICD-10-CM

## 2017-07-17 DIAGNOSIS — Z794 Long term (current) use of insulin: Secondary | ICD-10-CM | POA: Diagnosis not present

## 2017-07-17 DIAGNOSIS — R3915 Urgency of urination: Secondary | ICD-10-CM | POA: Diagnosis not present

## 2017-07-17 DIAGNOSIS — I10 Essential (primary) hypertension: Secondary | ICD-10-CM

## 2017-07-17 DIAGNOSIS — E1129 Type 2 diabetes mellitus with other diabetic kidney complication: Secondary | ICD-10-CM

## 2017-07-17 LAB — URINALYSIS, ROUTINE W REFLEX MICROSCOPIC
Bilirubin Urine: NEGATIVE
HGB URINE DIPSTICK: NEGATIVE
Ketones, ur: NEGATIVE
NITRITE: NEGATIVE
RBC / HPF: NONE SEEN (ref 0–?)
Specific Gravity, Urine: 1.01 (ref 1.000–1.030)
Total Protein, Urine: NEGATIVE
URINE GLUCOSE: NEGATIVE
Urobilinogen, UA: 0.2 (ref 0.0–1.0)
pH: 6 (ref 5.0–8.0)

## 2017-07-17 LAB — POCT URINALYSIS DIPSTICK
Bilirubin, UA: NEGATIVE
Blood, UA: NEGATIVE
GLUCOSE UA: NEGATIVE
KETONES UA: NEGATIVE
Nitrite, UA: NEGATIVE
Protein, UA: NEGATIVE
SPEC GRAV UA: 1.015 (ref 1.010–1.025)
Urobilinogen, UA: 0.2 E.U./dL
pH, UA: 5 (ref 5.0–8.0)

## 2017-07-17 NOTE — Addendum Note (Signed)
Addended by: Agnes Lawrence on: 07/17/2017 12:09 PM   Modules accepted: Orders

## 2017-07-17 NOTE — Progress Notes (Signed)
HPI:  Acute visit for:  ? Elevated BP: -increased stress this week -checked BP at home a number of times and taging 120-150s/70s -she is concerned is running higher -no CP, SOB, DOE  Dysuira: -x1 week -urgency a few times -denies fevers, malaise, chills, flank pain, hematuria, abd pain  ROS: See pertinent positives and negatives per HPI.  Past Medical History:  Diagnosis Date  . Anemia   . Arthritis   . Cataract    immature   . Chest pain summer 2012  . Chicken pox   . Diverticulosis    difficulty with colonoscopy 2005  . 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 Elyse Hsu 10/08/2012  . Fibroid    fibroid tumor  . Fracture    radial/ulnar 03/2016  . GERD (gastroesophageal reflux disease)   . Heart murmur    innocent murmur  . High cholesterol   . Hypertension   . Hypothyroid, s/p radioactive iodine tx for graves 2001 08/24/2012  . Intestinal metaplasia of gastric mucosa   . Kidney disease   . PD (Parkinson's disease) ([place]) 08/08/2016  . Renal cancer ([place])   . UTI (urinary tract infection)   . Weight loss    eval with GI in 2016; s/p CT/MRI and PET scans    Past Surgical History:  Procedure Laterality Date  . ABDOMINAL HYSTERECTOMY  1989  . COLONOSCOPY    . DG BARIUM SWALLOW ([facility] HX)     9 yrs ago per pt due to incomplete colonsocopy    Family History  Problem Relation Age of Onset  . Cancer Mother        liver  . Liver cancer Mother   . Stroke Father 61  . Diabetes Father   . Hypertension Father   . Diabetes Maternal Grandmother   . Prostate cancer Maternal Grandfather   . Pancreatic cancer Brother   . Diabetes Sister   . Cancer Sister   . Diabetes Sister   . Colon polyps Neg Hx   . Colon cancer Neg Hx   . Esophageal cancer Neg Hx   . Rectal cancer Neg Hx   . Stomach cancer Neg Hx     Social History   Social History  . Marital status: Single    Spouse name: N/A  . Number of children: N/A  .  Years of education: N/A   Occupational History  . retired     taught at [employer], histology, anatomy and physiology   Social History Main Topics  . Smoking status: Never Smoker  . Smokeless tobacco: Never Used  . Alcohol use No  . Drug use: No  . Sexual activity: Not Asked   Other Topics Concern  . 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  aspirin 81 MG tablet, Take 81 mg by mouth daily., Disp: , Rfl:  .  atorvastatin (LIPITOR) 40 MG tablet, Take 40 mg by mouth daily., Disp: , Rfl:  .  Calcium Carbonate-Vitamin D (CALTRATE 600+D) 600-400  MG-UNIT per tablet, Take 1 tablet by mouth daily., Disp: , Rfl:  .  carbidopa-levodopa (SINEMET IR) 25-100 MG tablet, Take 1 tablet by mouth 3 (three) times daily., Disp: 270 tablet, Rfl: 1 .  HUMALOG KWIKPEN 100 UNIT/ML KiwkPen, , Disp: , Rfl:  .  hydrochlorothiazide (HYDRODIURIL) 25 MG tablet, Take 25 mg by mouth daily. , Disp: , Rfl:  .  insulin glargine (LANTUS) 100 UNIT/ML injection, Inject 25 Units into the skin at bedtime. , Disp: , Rfl:  .  levothyroxine (SYNTHROID, LEVOTHROID) 75 MCG tablet, Take 75 mcg by mouth daily., Disp: , Rfl:  .  losartan (COZAAR) 50 MG tablet, TAKE 1 TABLET BY MOUTH DAILY, Disp: 90 tablet, Rfl: 1 .  METFORMIN HCL PO, Take 500 mg by mouth 2 (two) times daily. , Disp: , Rfl:  .  Multiple Vitamins-Minerals (PRESERVISION AREDS PO), Take by mouth., Disp: , Rfl:  .  Pioglitazone HCl (ACTOS PO), Take 45 mg by mouth daily. , Disp: , Rfl:   Current Facility-Administered Medications:  .  0.9 %  sodium chloride infusion, 500 mL, Intravenous, Continuous,  Armbruster, Carlota Raspberry, MD  EXAM:  Vitals:   07/17/17 1027  BP: 112/60  Pulse: 75  Temp: 98.6 F (37 C)    Body mass index is 22.87 kg/m.  GENERAL: vitals reviewed and listed above, alert, oriented, appears well hydrated and in no acute distress  HEENT: atraumatic, conjunttiva clear, no obvious abnormalities on inspection of external nose and ears  NECK: no obvious masses on inspection  LUNGS: clear to auscultation bilaterally, no wheezes, rales or rhonchi, good air movement  CV: HRRR, no peripheral edema  MS: moves all extremities without noticeable abnormality  PSYCH: pleasant and cooperative, no obvious depression or anxiety  ASSESSMENT AND PLAN:  Discussed the following assessment and plan:  Hypertension associated with diabetes ([place])  Type 2 diabetes mellitus with other diabetic kidney complication, with long-term current use of insulin ([facility])  Urgency of urination  -BP looks good here today -check urine -follow up 2 weeks with her cuff as she is quite anxious about this -Patient advised to return or notify a doctor immediately if symptoms worsen or persist or new concerns arise.  Patient Instructions  BEFORE YOU LEAVE: -does she want her flu shot? -udip with reflex micro/culture if abnormal -follow up: 2 weeks and bring BP cuff    Aseret Hoffman R., DO

## 2017-07-17 NOTE — Patient Instructions (Signed)
BEFORE YOU LEAVE: -does she want her flu shot? -udip with reflex micro/culture if abnormal -follow up: 2 weeks and bring BP cuff

## 2017-07-19 LAB — URINE CULTURE
MICRO NUMBER: 81146635
SPECIMEN QUALITY: ADEQUATE

## 2017-07-21 ENCOUNTER — Other Ambulatory Visit: Payer: Self-pay | Admitting: Family Medicine

## 2017-07-31 ENCOUNTER — Ambulatory Visit
Admission: RE | Admit: 2017-07-31 | Discharge: 2017-07-31 | Disposition: A | Discharge status: Home / Self Care | Payer: Medicare Other | Source: Ambulatory Visit | Attending: Family Medicine | Admitting: Family Medicine

## 2017-07-31 DIAGNOSIS — E2839 Other primary ovarian failure: Secondary | ICD-10-CM

## 2017-08-07 ENCOUNTER — Encounter: Payer: Self-pay | Admitting: *Deleted

## 2017-08-07 NOTE — Progress Notes (Deleted)
HPI:  Follow up HTN. Had been running high on her cuff at home, but normal here. She was anxious about this. Was to bring cuff today.  ROS: See pertinent positives and negatives per HPI.  Past Medical History:  Diagnosis Date  . Anemia   . Arthritis   . Cataract    immature   . Chest pain summer 2012  . Chicken pox   . Diverticulosis    difficulty with colonoscopy 2005  . 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 Elyse Hsu 10/08/2012  . Fibroid    fibroid tumor  . Fracture    radial/ulnar 03/2016  . GERD (gastroesophageal reflux disease)   . Heart murmur    innocent murmur  . High cholesterol   . Hypertension   . Hypothyroid, s/p radioactive iodine tx for graves 2001 08/24/2012  . Intestinal metaplasia of gastric mucosa   . Kidney disease   . PD (Parkinson's disease) ([place]) 08/08/2016  . Renal cancer ([place])   . UTI (urinary tract infection)   . Weight loss    eval with GI in 2016; s/p CT/MRI and PET scans    Past Surgical History:  Procedure Laterality Date  . ABDOMINAL HYSTERECTOMY  1989  . COLONOSCOPY    . DG BARIUM SWALLOW ([place] [facility])     9 yrs ago per pt due to incomplete colonsocopy    Family History  Problem Relation Age of Onset  . Cancer Mother        liver  . Liver cancer Mother   . Stroke Father 53  . Diabetes Father   . Hypertension Father   . Diabetes Maternal Grandmother   . Prostate cancer Maternal Grandfather   . Pancreatic cancer Brother   . Diabetes Sister   . Cancer Sister   . Diabetes Sister   . Colon polyps Neg Hx   . Colon cancer Neg Hx   . Esophageal cancer Neg Hx   . Rectal cancer Neg Hx   . Stomach cancer Neg Hx     Social History   Socioeconomic History  . Marital status: Single    Spouse name: Not on file  . Number of children: Not on file  . Years of education: Not on file  . Highest education level: Not on file  Social Needs  . Financial resource strain: Not on file  . Food  insecurity - worry: Not on file  . Food insecurity - inability: Not on file  . Transportation needs - medical: Not on file  . Transportation needs - non-medical: Not on file  Occupational History  . 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 Smoking status: Never Smoker  . Smokeless tobacco: Never Used  Substance and Sexual Activity  . Alcohol use: No    Alcohol/week: 0.0 oz  . Drug use: No  . Sexual activity: Not on file  Other Topics Concern  . 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  aspirin 81 MG tablet, Take 81 mg by mouth daily., Disp: , Rfl:  .  atorvastatin (LIPITOR) 40 MG tablet, Take 40 mg by mouth daily., Disp: , Rfl:  .  Calcium Carbonate-Vitamin D (CALTRATE 600+D) 600-400 MG-UNIT per tablet, Take 1 tablet by mouth daily., Disp: , Rfl:  .  carbidopa-levodopa (SINEMET IR) 25-100 MG tablet, Take 1 tablet by mouth 3 (three) times daily., Disp: 270 tablet, Rfl: 1 .  HUMALOG KWIKPEN 100 UNIT/ML KiwkPen, , Disp: , Rfl:  .  hydrochlorothiazide (HYDRODIURIL) 25 MG tablet, Take 25 mg by mouth daily. , Disp: , Rfl:  .  insulin glargine (LANTUS) 100 UNIT/ML injection, Inject 25 Units into the skin at bedtime. , Disp: , Rfl:  .  levothyroxine (SYNTHROID, LEVOTHROID) 75 MCG tablet, Take 75 mcg by mouth daily., Disp: , Rfl:  .  losartan (COZAAR) 50 MG tablet, TAKE 1 TABLET BY MOUTH DAILY, Disp: 90 tablet, Rfl: 0 .  METFORMIN HCL PO, Take 500 mg by mouth 2 (two) times daily. , Disp: , Rfl:  .  Multiple Vitamins-Minerals (PRESERVISION AREDS PO), Take by  mouth., Disp: , Rfl:  .  Pioglitazone HCl (ACTOS PO), Take 45 mg by mouth daily. , Disp: , Rfl:   Current Facility-Administered Medications:  .  0.9 %  sodium chloride infusion, 500 mL, Intravenous, Continuous, Armbruster, Carlota Raspberry, MD  EXAM:  There were no vitals filed for this visit.  There is no height or weight on file to calculate BMI.  GENERAL: vitals reviewed and listed above, alert, oriented, appears well hydrated and in no acute distress  HEENT: atraumatic, conjunttiva clear, no obvious abnormalities on inspection of external nose and ears  NECK: no obvious masses on inspection  LUNGS: clear to auscultation bilaterally, no wheezes, rales or rhonchi, good air movement  CV: HRRR, no peripheral edema  MS: moves all extremities without noticeable abnormality  PSYCH: pleasant and cooperative, no obvious depression or anxiety  ASSESSMENT AND PLAN:  Discussed the following assessment and plan:  No diagnosis found.  -Patient advised to return or notify a doctor immediately if symptoms worsen or persist or new concerns arise.  There are no Patient Instructions on file for this visit.  Colin Benton R., DO

## 2017-08-08 ENCOUNTER — Ambulatory Visit: Payer: Medicare Other | Admitting: Family Medicine

## 2017-08-08 DIAGNOSIS — Z0289 Encounter for other administrative examinations: Secondary | ICD-10-CM

## 2017-09-03 NOTE — Progress Notes (Signed)
HPI:   AWV 05/2017 Due for flu shot and foot exam. She sees endocrinology per her preference for diabetes.  Hx hypertension: -have sig reduce medication regimen as BP dropped with parkisons -she was worried BP was running high at home a few weeks ago, it was good here -returns to recheck and brought her cuff which is running much higher than our manual check in the office -meds: hctz 25, losartan 50 -She exercises frequently in a cycling class and is doing quite well, reports she feels great and denies any chest pain, shortness of breath, swelling, dizziness, lightheaded symptoms were otherwise -Reports she had labs done with her endocrinologist end of October that she was told her kidney function was great -She is due for diabetic foot exam  ROS: See pertinent positives and negatives per HPI.  Past Medical History:  Diagnosis Date  . Anemia   . Arthritis   . Cataract    immature   . Chest pain summer 2012  . Chicken pox   . Diverticulosis    difficulty with colonoscopy 2005  . 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 Elyse Hsu 10/08/2012  . Fibroid    fibroid tumor  . Fracture    radial/ulnar 03/2016  . GERD (gastroesophageal reflux disease)   . Heart murmur    innocent murmur  . High cholesterol   . Hypertension   . Hypothyroid, s/p radioactive iodine tx for graves 2001 08/24/2012  . Intestinal metaplasia of gastric mucosa   . Kidney disease   . PD (Parkinson's disease) ([place]) 08/08/2016  . Renal cancer ([place])   . UTI (urinary tract infection)   . Weight loss    eval with GI in 2016; s/p CT/MRI and PET scans    Past Surgical History:  Procedure Laterality Date  . ABDOMINAL HYSTERECTOMY  1989  . COLONOSCOPY    . DG BARIUM SWALLOW ([place] [facility])     9 yrs ago per pt due to incomplete colonsocopy    Family History  Problem Relation Age of Onset  . Cancer Mother        liver  . Liver cancer Mother   . Stroke Father 41  .  Diabetes Father   . Hypertension Father   . Diabetes Maternal Grandmother   . Prostate cancer Maternal Grandfather   . Pancreatic cancer Brother   . Diabetes Sister   . Cancer Sister   . Diabetes Sister   . Colon polyps Neg Hx   . Colon cancer Neg Hx   . Esophageal cancer Neg Hx   . Rectal cancer Neg Hx   . Stomach cancer Neg Hx     Social History   Socioeconomic History  . Marital status: Single    Spouse name: None  . Number of children: None  . Years of education: None  . Highest education level: None  Social Needs  . Financial resource strain: None  . Food insecurity - worry: None  . Food insecurity - inability: None  . Transportation needs - medical: None  . Transportation needs - non-medical: None  Occupational History  . 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 Smoking status: Never Smoker  . Smokeless tobacco: Never Used  Substance and Sexual Activity  . Alcohol use: No    Alcohol/week: 0.0 oz  . Drug use: No  . Sexual activity: None  Other Topics Concern  . 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  aspirin 81 MG tablet, Take 81 mg by mouth daily., Disp: , Rfl:  .  atorvastatin (LIPITOR) 40 MG tablet, Take 40 mg by mouth daily., Disp: , Rfl:  .  Calcium Carbonate-Vitamin D (CALTRATE 600+D) 600-400 MG-UNIT per tablet, Take 1 tablet by mouth daily., Disp: , Rfl:  .  carbidopa-levodopa (SINEMET IR) 25-100 MG tablet, Take 1 tablet by mouth 3 (three) times daily., Disp: 270 tablet, Rfl: 1 .  Cholecalciferol (VITAMIN D PO), Take 5,000  Units by mouth daily., Disp: , Rfl:  .  HUMALOG KWIKPEN 100 UNIT/ML KiwkPen, , Disp: , Rfl:  .  hydrochlorothiazide (HYDRODIURIL) 25 MG tablet, Take 25 mg by mouth daily. , Disp: , Rfl:  .  insulin glargine (LANTUS) 100 UNIT/ML injection, Inject 25 Units into the skin at bedtime. , Disp: , Rfl:  .  levothyroxine (SYNTHROID, LEVOTHROID) 75 MCG tablet, Take 75 mcg by mouth daily., Disp: , Rfl:  .  losartan (COZAAR) 50 MG tablet, TAKE 1 TABLET BY MOUTH DAILY, Disp: 90 tablet, Rfl: 0 .  METFORMIN HCL PO, Take 500 mg by mouth 2 (two) times daily. , Disp: , Rfl:  .  Multiple Vitamins-Minerals (PRESERVISION AREDS PO), Take by mouth., Disp: , Rfl:  .  Pioglitazone HCl (ACTOS PO), Take 45 mg by mouth daily. , Disp: , Rfl:   Current Facility-Administered Medications:  .  0.9 %  sodium chloride infusion, 500 mL, Intravenous, Continuous, Armbruster, Carlota Raspberry, MD  EXAM:  Vitals:   09/04/17 0851  BP: 112/60  Pulse: 64  Temp: 98.4 F (36.9 C)    Body mass index is 22.73 kg/m.  GENERAL: vitals reviewed and listed above, alert, oriented, appears well hydrated and in no acute distress  HEENT: atraumatic, conjunttiva clear, no obvious abnormalities on inspection of external nose and ears  NECK: no obvious masses on inspection  LUNGS: clear to auscultation bilaterally, no wheezes, rales or rhonchi, good air movement  CV: HRRR, no peripheral edema  MS: moves all extremities without noticeable abnormality  PSYCH: pleasant and cooperative, no obvious depression or anxiety  Foot exam done, see epic.  ASSESSMENT AND PLAN:  Discussed the following assessment and plan:  Hypertension associated with diabetes ([place])  Type 2 diabetes mellitus with other diabetic kidney complication, with long-term current use of insulin (H[facility])  CKD (chronic kidney disease) stage 3, GFR 30-59 ml/min ([facility])  -Foot exam and foot care instructions provided today -We will advise assistant to obtain her labs from  endocrine and updated in the system if not already done -Her blood pressure looks great today and compared to her cuff, it seems like her cuff is running high -She will consider getting a new cuff or we will stop checking her blood pressure, she feels great so we will continue her current medications -Did advise that is likely we will need to continue to decrease her anti-hypertensive medications over time -Encouraged her to continue a healthy diet and regular exercise, I am glad she is  doing so well -Follow-up 3-4 months -Patient advised to return or notify a doctor immediately if symptoms worsen or persist or new concerns arise.  Patient Instructions  BEFORE YOU LEAVE: -follow up: 3-4 months  Please get a new blood pressure cuff as it seems that the one you have is running high.  Continue exercise.  Check your feet nightly and seek care promptly if any wounds or calluses.  Have a very happy holiday season!    Colin Benton R., DO

## 2017-09-04 ENCOUNTER — Encounter: Payer: Self-pay | Admitting: Family Medicine

## 2017-09-04 ENCOUNTER — Ambulatory Visit: Payer: Medicare Other | Admitting: Family Medicine

## 2017-09-04 VITALS — BP 112/60 | HR 64 | Temp 98.4°F | Ht 66.0 in | Wt 140.8 lb

## 2017-09-04 DIAGNOSIS — N183 Chronic kidney disease, stage 3 unspecified: Secondary | ICD-10-CM

## 2017-09-04 DIAGNOSIS — E1159 Type 2 diabetes mellitus with other circulatory complications: Secondary | ICD-10-CM

## 2017-09-04 DIAGNOSIS — I10 Essential (primary) hypertension: Secondary | ICD-10-CM

## 2017-09-04 DIAGNOSIS — E1129 Type 2 diabetes mellitus with other diabetic kidney complication: Secondary | ICD-10-CM | POA: Diagnosis not present

## 2017-09-04 DIAGNOSIS — Z794 Long term (current) use of insulin: Secondary | ICD-10-CM | POA: Diagnosis not present

## 2017-09-04 NOTE — Patient Instructions (Addendum)
BEFORE YOU LEAVE: -follow up: 3-4 months  Please get a new blood pressure cuff as it seems that the one you have is running high.  Continue exercise.  Check your feet nightly and seek care promptly if any wounds or calluses.  Have a very happy holiday season!

## 2017-09-15 NOTE — Progress Notes (Signed)
Pamela Patton was seen today in the movement disorders clinic for neurologic consultation at the request of Lucretia Kern, DO.  The consultation is for the evaluation of gait change, cogwheel rigidity and to r/o PD.  The records that were made available to me were reviewed.  Pt reports that she has had unsteadiness at least since June.  She fell then and broke her radius/ulna.  States that she turned around at the sink and thinks that she tripped over her feet and "raggady bottom shoes"  10/11/16 update:  The patient follows up today.  I started her on carbidopa/levodopa 25/100 and she has worked up to one tablet 3 times daily.  "I think that it is helping."  When first started the whole tablets she had mild nausea for 5 minutes only but that is better.  Pt denies falls.  Pt denies lightheadedness, near syncope.  No hallucinations.  Mood has been "pretty good." The patient has been attending therapy at the outpatient rehabilitation center for physical, occupational and speech therapy. She will finish up OT/PT this week and has ST until second week of February.   I have reviewed notes.   A MBE was requested by speech therapy and this is scheduled for 10/13/2016.  No other CV exercise besides for therapy.    02/08/17 update:  Patient seen in follow-up today.  States that "I feel perfectly fine this AM but this past weekend I had abdominal and back pain."  Was better if relaxed and stretched.  She is unsure if it is related to timing of levodopa.   Admits that she was raking leaves a few days before when it started.   She is on carbidopa/levodopa 25/100, one tablet 3 times per day (7-9am/2-3pm/7pm).  She had a modified barium swallow on 10/13/2016 that demonstrated mild pharyngeal phase dysphagia.  Regular diet with thin liquids was recommended.  She had one fall in her sleep and fell off of the bed.  05/19/17 update:  Pt f/u today for Parkinsonism.  The records that were made available to me were reviewed since  last visit.  On carbidopa/levodopa 25/100 tid (8-8:30/12-12:30/6:00).  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  About 2 weeks ago, she did have a very vivid dream and she states that in general "I have been sleeping really nicely."   The records that were made available to me were reviewed, including therapy notes.  States was taken off of norvasc as BP's were going too low.  Wearing off:  No.  How long before next dose:  n/a Falls:   No. N/V:  No. Hallucinations:  No.  visual distortions: No. Lightheaded:  No.  Syncope: No. Dyskinesia:  No.   09/18/17 update: Patient seen today in follow-up for Parkinson's.  I have reviewed her records available to me since last visit.  I increased her levodopa last visit to carbidopa/levodopa 25/100, 1.5 tablets 3 times per day.  She states that she only ended up increasing the AM one as she thought that it was for tremor and it did help going up.  She did go to outpatient rehab since our last visit.  Those records are reviewed.  She was exercising with Parkinson's cycle class but she had cataract surgery and says she has been a little "off" with that.  She is back to going now and "it makes me feel good."  She has had no falls.  No lightheadedness or near syncope.  No hallucinations.  She denies any swallowing issues.  She has been seeing endocrinology for her diabetes.  ALLERGIES:  No Known Allergies  CURRENT MEDICATIONS:  Outpatient Encounter Medications as of 09/18/2017  Medication Sig  . aspirin 81 MG tablet Take 81 mg by mouth daily.  Marland Kitchen atorvastatin (LIPITOR) 40 MG tablet Take 40 mg by mouth daily.  . Calcium Carbonate-Vitamin D (CALTRATE 600+D) 600-400 MG-UNIT per tablet Take 1 tablet by mouth daily.  . carbidopa-levodopa (SINEMET IR) 25-100 MG tablet Take 1.5 tablets by mouth 3 (three) times daily.  . Cholecalciferol (VITAMIN D PO) Take 5,000 Units by mouth daily.  Marland Kitchen HUMALOG  KWIKPEN 100 UNIT/ML KiwkPen   . hydrochlorothiazide (HYDRODIURIL) 25 MG tablet Take 25 mg by mouth daily.   . insulin glargine (LANTUS) 100 UNIT/ML injection Inject 25 Units into the skin at bedtime.   Marland Kitchen levothyroxine (SYNTHROID, LEVOTHROID) 75 MCG tablet Take 75 mcg by mouth daily.  Marland Kitchen losartan (COZAAR) 50 MG tablet TAKE 1 TABLET BY MOUTH DAILY  . METFORMIN HCL PO Take 500 mg by mouth 2 (two) times daily.   . Multiple Vitamins-Minerals (PRESERVISION AREDS PO) Take by mouth.  . Pioglitazone HCl (ACTOS PO) Take 45 mg by mouth daily.   . [DISCONTINUED] carbidopa-levodopa (SINEMET IR) 25-100 MG tablet Take 1 tablet by mouth 3 (three) times daily. (Patient taking differently: 1.5 tablets in the morning, 1 in the afternoon, 1 in the evening)   Facility-Administered Encounter Medications as of 09/18/2017  Medication  . 0.9 %  sodium chloride infusion    PAST MEDICAL HISTORY:   Past Medical History:  Diagnosis Date  . Anemia   . Arthritis   . Cataract    immature   . Chest pain summer 2012  . Chicken pox   . Diverticulosis    difficulty with colonoscopy 2005  . 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 Elyse Hsu 10/08/2012  . Fibroid    fibroid tumor  . Fracture    radial/ulnar 03/2016  . GERD (gastroesophageal reflux disease)   . Heart murmur    innocent murmur  . High cholesterol   . Hypertension   . Hypothyroid, s/p radioactive iodine tx for graves 2001 08/24/2012  . Intestinal metaplasia of gastric mucosa   . Kidney disease   . PD (Parkinson's disease) ([place]) 08/08/2016  . Renal cancer ([place])   . UTI (urinary tract infection)   . Weight loss    eval with GI in 2016; s/p CT/MRI and PET scans    PAST SURGICAL HISTORY:   Past Surgical History:  Procedure Laterality Date  . ABDOMINAL HYSTERECTOMY  1989  . CATARACT EXTRACTION, BILATERAL    . COLONOSCOPY    . DG BARIUM SWALLOW ([place] [facility])     9 yrs ago per pt due to incomplete  colonsocopy    SOCIAL HISTORY:   Social History   Socioeconomic History  . Marital status: Single    Spouse name: Not on file  . Number of children: Not on file  . Years of education: Not on file  . Highest education level: Not on file  Social Needs  . Financial resource strain: Not on file  . Food insecurity - worry: Not on file  . Food insecurity - inability: Not on file  . Transportation needs - medical: Not on file  . Transportation needs - non-medical: Not on file  Occupational History  .  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 Smoking status: Never Smoker  . Smokeless tobacco: Never Used  Substance and Sexual Activity  . Alcohol use: No    Alcohol/week: 0.0 oz  . Drug use: No  . Sexual activity: Not on file  Other Topics Concern  . 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 Mother  Deceased  . Father  Deceased  . MGM  Deceased  . MGF  Deceased  . PGM  Deceased  . PGF  Deceased  . Brother  Deceased  . Sister  Alive  . Sister  Deceased  . Sister  Alive  . Neg Hx  (Not Specified)    ROS:  A complete 10 system review of systems was obtained and was unremarkable apart from what is mentioned above.  PHYSICAL EXAMINATION:    VITALS:   Vitals:   09/18/17 1057  BP: 100/68  Pulse: 68  SpO2: 96%  Weight: 138 lb (62.6 kg)  Height: 5\' 6"  (1.676 m)   Wt Readings from Last 3 Encounters:  09/18/17 138 lb (62.6 kg)  09/04/17 140 lb 12.8 oz (63.9 kg)  07/17/17 141 lb 11.2 oz (64.3 kg)    GEN:  The patient  appears stated age and is in NAD. HEENT:  Normocephalic, atraumatic.  The mucous membranes are moist. The superficial temporal arteries are without ropiness or tenderness. CV:  RRR Lungs:  CTAB Neck/HEME:  There are no carotid bruits bilaterally.   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  Neurological examination:  Orientation: The patient is alert and oriented x3.  Cranial nerves: There is good facial symmetry. There is facial hypomimia.   The speech is fluent and clear. Speech is pseudobulbar in quality.  Soft palate rises symmetrically and there is no tongue deviation. Hearing is intact to conversational tone. Sensation: Sensation is intact to light and pinprick throughout (facial, trunk, extremities). Vibration is intact at the bilateral big toe. There is no extinction with double simultaneous stimulation. There is no sensory dermatomal level identified. Motor: Strength is 5/5 in the bilateral upper and lower extremities with the exception of abduction of fingers on the left and strength there is 4/5.   Shoulder shrug is equal and symmetric.  There is no pronator drift.   Movement examination: Tone: There is mild increased tone in the LUE Abnormal movements: There is rare tremor in the L hand and in the chin.  Occasional tremor in the legs.   Coordination:  There is decremation with any form of RAMS, including alternating supination and pronation of the forearm, hand opening and closing, finger taps, heel taps and toe taps bilaterally, L more than right Gait and Station: The patient has no significant difficulty arising out of a deep-seated chair without the use of the hands. The patient's stride length is normal with slight decreased arm swing bilaterally  LABS  Chemistry      Component Value Date/Time   NA 142 04/27/2017   K 3.8 04/27/2017   CL 106 06/03/2016 1444   CO2 31 06/03/2016 1444   BUN 23 (A) 04/27/2017   CREATININE 1.2 (A) 04/27/2017   CREATININE 1.30  (H) 03/07/2017 1050   GLU 154 04/27/2017      Component Value Date/Time   CALCIUM 9.8 06/03/2016 1444   ALKPHOS 55 04/27/2017   AST 14 04/27/2017   ALT 10 04/27/2017   BILITOT 0.6 06/03/2016 1444     Lab Results  Component Value Date   TSH 0.30 (A) 04/27/2017   No results found for: VITAMINB12   ASSESSMENT/PLAN:  1.  Parkinsonism.  I suspect that this does represent idiopathic Parkinson's disease but I do think that an atypical state like PSP is in the ddx given diplopia and pseudobulbar speech.  However, tremor is somewhat atypical in PSP.  I discussed with her that time will help [place] differentiate the 2.  In addition, I told her that treatments were the same, although I told her that prognosis was very different.  -my intention last visit was to increase carbidopa/levodopa 25/100 to 1.5 tablets tid but she only did carbidopa/levodopa 25/100 to 1.5/1/1.  Told her to go ahead and make all dosages 1.5 tablets.  She was agreeable.  Risks, benefits, side effects and alternative therapies were discussed.  The opportunity to ask questions was given and they were answered to the best of my ability.  The patient expressed understanding and willingness to follow the outlined treatment protocols.  -she is back to [facility] cycle.  Discussed importance of continuing.  She enjoys the class  2.  Dysphagia  -She had a modified barium swallow on 10/13/2016 that demonstrated mild pharyngeal phase dysphagia.  Regular diet with thin liquids was recommended.  3.  Low blood pressure  -she has been taken off of 2 of BP meds and feels better.  Only on HCTZ now.  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  RBD  -watching closely. She doesn't want more medication but was agreeable to trying safety rails and talked to her about [place] them on [place]    5. Follow up is anticipated in the next few months, sooner should new neurologic issues arise.  Much greater than 50% of this visit was spent in counseling and coordinating  care.  Total face to face time:  25 min     Cc:  Lucretia Kern, DO

## 2017-09-18 ENCOUNTER — Encounter: Payer: Self-pay | Admitting: Neurology

## 2017-09-18 ENCOUNTER — Ambulatory Visit: Payer: Medicare Other | Admitting: Neurology

## 2017-09-18 VITALS — BP 100/68 | HR 68 | Ht 66.0 in | Wt 138.0 lb

## 2017-09-18 DIAGNOSIS — I952 Hypotension due to drugs: Secondary | ICD-10-CM

## 2017-09-18 DIAGNOSIS — G20 Parkinson's disease: Secondary | ICD-10-CM | POA: Diagnosis not present

## 2017-09-18 MED ORDER — CARBIDOPA-LEVODOPA 25-100 MG PO TABS: 1.5000 | ORAL_TABLET | Freq: Three times a day (TID) | ORAL | 1 refills | Status: DC

## 2017-09-18 NOTE — Patient Instructions (Signed)
Increase carbidopa/levodopa 25/100 to 1.5 tablets three times per day  Merry Christmas!

## 2017-10-12 ENCOUNTER — Encounter: Payer: Self-pay | Admitting: Family Medicine

## 2017-11-01 LAB — LIPID PANEL
Cholesterol: 153 (ref 0–200)
HDL: 74 — AB (ref 35–70)
LDL Cholesterol: 65
TRIGLYCERIDES: 50 (ref 40–160)

## 2017-11-01 LAB — HEMOGLOBIN A1C: Hemoglobin A1C: 7.9

## 2017-11-05 ENCOUNTER — Other Ambulatory Visit: Payer: Self-pay | Admitting: Family Medicine

## 2017-11-28 ENCOUNTER — Other Ambulatory Visit: Payer: Self-pay

## 2017-11-28 ENCOUNTER — Emergency Department (HOSPITAL_COMMUNITY)
Admission: EM | Admit: 2017-11-28 | Discharge: 2017-11-28 | Disposition: A | Discharge status: Home / Self Care | Payer: Medicare Other | Attending: Emergency Medicine | Admitting: Emergency Medicine

## 2017-11-28 ENCOUNTER — Emergency Department (HOSPITAL_COMMUNITY): Payer: Medicare Other

## 2017-11-28 ENCOUNTER — Encounter (HOSPITAL_COMMUNITY): Payer: Self-pay | Admitting: *Deleted

## 2017-11-28 DIAGNOSIS — I6529 Occlusion and stenosis of unspecified carotid artery: Secondary | ICD-10-CM | POA: Diagnosis not present

## 2017-11-28 DIAGNOSIS — Z794 Long term (current) use of insulin: Secondary | ICD-10-CM | POA: Insufficient documentation

## 2017-11-28 DIAGNOSIS — Z79899 Other long term (current) drug therapy: Secondary | ICD-10-CM | POA: Diagnosis not present

## 2017-11-28 DIAGNOSIS — I2510 Atherosclerotic heart disease of native coronary artery without angina pectoris: Secondary | ICD-10-CM | POA: Insufficient documentation

## 2017-11-28 DIAGNOSIS — R0789 Other chest pain: Secondary | ICD-10-CM

## 2017-11-28 DIAGNOSIS — Z7982 Long term (current) use of aspirin: Secondary | ICD-10-CM | POA: Diagnosis not present

## 2017-11-28 DIAGNOSIS — E782 Mixed hyperlipidemia: Secondary | ICD-10-CM | POA: Diagnosis not present

## 2017-11-28 DIAGNOSIS — E039 Hypothyroidism, unspecified: Secondary | ICD-10-CM | POA: Insufficient documentation

## 2017-11-28 DIAGNOSIS — R0782 Intercostal pain: Secondary | ICD-10-CM

## 2017-11-28 DIAGNOSIS — N183 Chronic kidney disease, stage 3 (moderate): Secondary | ICD-10-CM | POA: Diagnosis not present

## 2017-11-28 DIAGNOSIS — E119 Type 2 diabetes mellitus without complications: Secondary | ICD-10-CM

## 2017-11-28 DIAGNOSIS — G20 Parkinson's disease: Secondary | ICD-10-CM | POA: Insufficient documentation

## 2017-11-28 DIAGNOSIS — E1122 Type 2 diabetes mellitus with diabetic chronic kidney disease: Secondary | ICD-10-CM | POA: Insufficient documentation

## 2017-11-28 DIAGNOSIS — R079 Chest pain, unspecified: Secondary | ICD-10-CM | POA: Diagnosis present

## 2017-11-28 LAB — BASIC METABOLIC PANEL
Anion gap: 11 (ref 5–15)
BUN: 29 mg/dL — AB (ref 6–20)
CALCIUM: 9.3 mg/dL (ref 8.9–10.3)
CO2: 23 mmol/L (ref 22–32)
CREATININE: 1.37 mg/dL — AB (ref 0.44–1.00)
Chloride: 109 mmol/L (ref 101–111)
GFR calc Af Amer: 43 mL/min — ABNORMAL LOW (ref >60)
GFR, EST NON AFRICAN AMERICAN: 37 mL/min — AB (ref >60)
GLUCOSE: 194 mg/dL — AB (ref 65–99)
POTASSIUM: 4 mmol/L (ref 3.5–5.1)
Sodium: 143 mmol/L (ref 135–145)

## 2017-11-28 LAB — CBC
HEMATOCRIT: 30.5 % — AB (ref 36.0–46.0)
Hemoglobin: 9.7 g/dL — ABNORMAL LOW (ref 12.0–15.0)
MCH: 29.6 pg (ref 26.0–34.0)
MCHC: 31.8 g/dL (ref 30.0–36.0)
MCV: 93 fL (ref 78.0–100.0)
PLATELETS: 216 10*3/uL (ref 150–400)
RBC: 3.28 MIL/uL — ABNORMAL LOW (ref 3.87–5.11)
RDW: 14.9 % (ref 11.5–15.5)
WBC: 4.4 10*3/uL (ref 4.0–10.5)

## 2017-11-28 LAB — I-STAT TROPONIN, ED: Troponin i, poc: 0 ng/mL (ref 0.00–0.08)

## 2017-11-28 LAB — TROPONIN I: Troponin I: <0.03 ng/mL (ref <0.03)

## 2017-11-28 MED ORDER — SODIUM CHLORIDE 0.9 % IV BOLUS (SEPSIS)
1000.0000 mL | Freq: Once | INTRAVENOUS | Status: AC
  Administered 2017-11-28: 1000 mL via INTRAVENOUS

## 2017-11-28 MED ORDER — ACETAMINOPHEN 500 MG PO TABS
1000.0000 mg | ORAL_TABLET | Freq: Once | ORAL | Status: AC
  Administered 2017-11-28: 1000 mg via ORAL
  Filled 2017-11-28: qty 2

## 2017-11-28 NOTE — Discharge Instructions (Signed)
Cardiology evaluated you today in the emergency department. They do not think your chest pain is from your heart. All your labs, EKG, chest x-rays, heart enzymes are normal today. Continue all home medications. Your hemoglobin is lower than usually at 9.6.  Please follow up with your primary care provider in 1 week for further evaluation of this and repeat labs.   Return for chest pain or shortness of breath on exertion or activity, fevers, chills, cough, dizziness, rash

## 2017-11-28 NOTE — ED Triage Notes (Signed)
Pt started having R sided chest pain under her R breast around 6p. Pain is non radiating , denies SOB

## 2017-11-28 NOTE — Consult Note (Addendum)
Cardiology Consultation:   Patient ID: Pamela Patton; [id number]; 12/14/1945   Admit date: 11/28/2017 Date of Consult: 11/28/2017  Primary Care Provider: Lucretia Kern, DO Primary Cardiologist: Dr. Gwenlyn Found Primary Electrophysiologist:  None   Patient Profile:   Pamela Patton is a 72 y.o. 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 Vanita Panda.  History of Present Illness:   Pamela Patton was last seen by cardiology outpatient 03/2017 and thought to be doing well. She was in her usual state of health until the evening of 11/27/17 when she developed right mid-chest discomfort around 6pm. She states she had just finished eating a light dinner when she noticed right sided pain described as a non-radiating, dull ache which ballooned outward to a quarter sized area of pain. She states the pain was fleeting, however continued to occur periodically throughout the evening. There were no aggravating factors and ?improvement with 81mg  ASA. She denied associated SOB, N/V, diaphoresis, dizziness, or lightheadedness. She states earlier in the day she went to her normal exercise cycle class. She denies any new activities or specific upper body exercises. States she has had similar chest pain in the past, however more centrally located, which was attributed to costochondritis after a normal cardiac work up. Given persistence of symptoms she decided to present to the ED overnight.   She states she had 1-2 more short episodes of chest discomfort since arrival to the ED. Again no other associated symptoms. She denies anginal complaints of CP with exertion, SOB, or DOE. She denies recent illness, orthopnea, PND, LE edema, pre-syncope or syncope, abdominal pain, N/V/D/C, melena, hematochezia, hematuria. She has been compliant with her  medications. Last stress test 2011 was normal. Has not had a recent echocardiogram. No history of invasive cardiac work-up.  ED course: mildly HTN, intermittent bradycardia, satting well on RA. Labs notable for electrolytes wnl, Cr 1.37 (baseline 1.2), Hgb 9.7 (baseline 11), PLT 216, Trop negative x2. CXR without acute findings. EKG with NSR, no STE/D, no TWI. Patient received tylenol and NS bolus in the ED. Currently chest pain free. Cardiology consulted for chest pain.   Past Medical History:  Diagnosis Date  . Anemia   . Arthritis   . Cataract    immature   . Chest pain summer 2012  . Chicken pox   . Diverticulosis    difficulty with colonoscopy 2005  . 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 Elyse Hsu 10/08/2012  . Fibroid    fibroid tumor  . Fracture    radial/ulnar 03/2016  . GERD (gastroesophageal reflux disease)   . Heart murmur    innocent murmur  . High cholesterol   . Hypertension   . Hypothyroid, s/p radioactive iodine tx for graves 2001 08/24/2012  . Intestinal metaplasia of gastric mucosa   . Kidney disease   . PD (Parkinson's disease) ([place]) 08/08/2016  . Renal cancer ([place])   . UTI (urinary tract infection)   . Weight loss    eval with GI in 2016; s/p CT/MRI and PET scans    Past Surgical History:  Procedure Laterality Date  . ABDOMINAL HYSTERECTOMY  1989  . CATARACT EXTRACTION, BILATERAL    . COLONOSCOPY    . DG BARIUM SWALLOW ([facility])  9 yrs ago per pt due to incomplete colonsocopy     Home Medications:  Prior to Admission medications   Medication Sig Start Date End Date Taking? Authorizing Provider  aspirin 81 MG tablet Take 81 mg by mouth daily with lunch.    Yes [provider]  atorvastatin (LIPITOR) 40 MG tablet Take 40 mg by mouth daily with lunch.    Yes [provider]  Calcium Carbonate-Vitamin D (CALTRATE 600+D) 600-400 MG-UNIT per tablet Take 1 tablet by mouth 3 (three) times a  week.    Yes [provider]  carbidopa-levodopa (SINEMET IR) 25-100 MG tablet Take 1.5 tablets by mouth 3 (three) times daily. Patient taking differently: Take 1.5 tablets by mouth 3 (three) times daily. adminstered at 9AM, Juan Quam, 8PM 09/18/17  Yes [name], Eustace Quail, DO  Cholecalciferol (VITAMIN D PO) Take 5,000 Units by mouth 3 (three) times a week.    Yes [provider]  glucose 4 GM chewable tablet Chew 1 tablet by mouth as needed for low blood sugar.   Yes [provider]  HUMALOG KWIKPEN 100 UNIT/ML KiwkPen Inject 0-4 Units into the skin 3 (three) times daily.  07/20/16  Yes [provider]  hydrochlorothiazide (HYDRODIURIL) 25 MG tablet Take 25 mg by mouth daily with lunch.  06/29/12  Yes [provider]  insulin glargine (LANTUS) 100 UNIT/ML injection Inject 15 Units into the skin every morning.    Yes [provider]  levothyroxine (SYNTHROID, LEVOTHROID) 75 MCG tablet Take 75 mcg by mouth daily.   Yes [provider]  losartan (COZAAR) 50 MG tablet TAKE 1 TABLET BY MOUTH DAILY Patient taking differently: TAKE 1 TABLET BY MOUTH DAILY WITH LUNCH 11/06/17  Yes Colin Benton R, DO  METFORMIN HCL PO Take 500 mg by mouth 2 (two) times daily.    Yes [provider]  Multiple Vitamins-Minerals (PRESERVISION AREDS PO) Take 1 tablet by mouth daily as needed (FOR EYES).    Yes [provider]  Nutritional Supplements (FEEDING SUPPLEMENT, GLUCERNA 1.2 CAL,) LIQD Place 237 mLs into feeding tube daily with breakfast.   Yes [provider]  Pioglitazone HCl (ACTOS PO) Take 45 mg by mouth daily with lunch.    Yes [provider]  Polyethyl Glycol-Propyl Glycol (SYSTANE) 0.4-0.3 % GEL ophthalmic gel Place 1 application into both eyes at bedtime as needed (FOR EYES).   Yes [provider]    Inpatient Medications: Scheduled Meds:  Continuous Infusions:  PRN Meds:   Allergies:   No Known  Allergies  Social History:   Social History   Socioeconomic History  . Marital status: Single    Spouse name: Not on file  . Number of children: Not on file  . Years of education: Not on file  . Highest education level: Not on file  Social Needs  . Financial resource strain: Not on file  . Food insecurity - worry: Not on file  . Food insecurity - inability: Not on file  . Transportation needs - medical: Not on file  . Transportation needs - non-medical: Not on file  Occupational History  . 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 Smoking status: Never Smoker  . Smokeless tobacco: Never Used  Substance and Sexual Activity  . Alcohol use: No    Alcohol/week: 0.0 oz  . Drug use: No  . Sexual activity: Not on file  Other Topics Concern  . 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 Cancer Mother        liver  . Liver cancer Mother   . Stroke Father 58  . Diabetes Father   . Hypertension Father   . Diabetes Maternal Grandmother   . Prostate cancer Maternal Grandfather   . Pancreatic cancer Brother   . Diabetes Sister   . Cancer Sister   . Diabetes Sister   . Colon polyps Neg Hx   . Colon cancer Neg Hx   . Esophageal cancer Neg Hx   . Rectal cancer Neg Hx   . Stomach cancer Neg Hx      ROS:  Please see the history of present illness.   All other ROS reviewed and negative.     Physical Exam/Data:   Vitals:   11/28/17 0650 11/28/17 0800 11/28/17 0900 11/28/17 1000  BP: (!) 156/77 (!) 122/51 (!)  110/53 (!) 136/59  Pulse:  (!) 54 (!) 51 60  Resp: 17 16 14 16   Temp:      TempSrc:      SpO2:  100% 100% 100%   No intake or output data in the 24 hours ending 11/28/17 1119 There were no vitals filed for this visit. There is no height or weight on file to calculate BMI.  General:  Elderly female, well nourished, well developed, in no acute distress HEENT: sclera anicteric  Neck: no JVD Vascular: No carotid bruits; distal pulses 2+ bilaterally Cardiac:  normal S1, S2; RRR; +murmur best heard R sternal border, no gallops/rubs; no TTP of chest wall. Lungs:  clear to auscultation bilaterally, no wheezing, rhonchi or rales  Abd: NABS, soft, nontender, no hepatomegaly Ext: no edema Musculoskeletal:  No deformities, BUE and BLE strength normal and equal Skin: warm and dry  Neuro:  Slow speech and movement; no focal abnormalities noted Psych:  Normal affect   EKG:  The EKG was personally reviewed and demonstrates:  EKG with NSR, no STE/D, no TWI.  Telemetry:  Telemetry was personally reviewed and demonstrates:  NSR, sinus brady  Relevant CV Studies:   Laboratory Data:  Chemistry Recent Labs  Lab 11/28/17 0300  NA 143  K 4.0  CL 109  CO2 23  GLUCOSE 194*  BUN 29*  CREATININE 1.37*  CALCIUM 9.3  GFRNONAA 37*  GFRAA 43*  ANIONGAP 11    No results for input(s): PROT, ALBUMIN, AST, ALT, ALKPHOS, BILITOT in the last 168 hours. Hematology Recent Labs  Lab 11/28/17 0300  WBC 4.4  RBC 3.28*  HGB 9.7*  HCT 30.5*  MCV 93.0  MCH 29.6  MCHC 31.8  RDW 14.9  PLT 216   Cardiac Enzymes Recent Labs  Lab 11/28/17 0300  TROPONINI <0.03    Recent Labs  Lab 11/28/17 0745  TROPIPOC 0.00    BNPNo results for input(s): BNP, PROBNP in the last 168 hours.  DDimer No results for input(s): DDIMER in the last 168 hours.  Radiology/Studies:  Dg Chest 2 View  Result Date: 11/28/2017 CLINICAL DATA:  72 year old female with chest pain. EXAM: CHEST  2 VIEW COMPARISON:  Chest CT  dated 06/16/2016 FINDINGS: The lungs are clear. There is no pleural effusion or pneumothorax. Top-normal cardiac silhouette. No acute osseous pathology. IMPRESSION: No active cardiopulmonary disease. Electronically Signed   By: Anner Crete M.D.   On: 11/28/2017 03:33    Assessment and Plan:   1. Atypical chest pain: patient presented with intermittent right sided chest discomfort starting around 6pm 11/27/17. No exacerbated by exertion and no associated SOB, DOE, diaphoresis, lightheadedness, dizziness, nausea, or vomiting. She has had similar chest pain in the past which was thought to be 2/2 costochondritis after negative cardiac work up. Had a remote stress test in 2011 which was normal. She exercises routinely and does not experience exertional CP or DOE. Here troponin's negative x2. EKG without STE/D, no TWI. Patient is currently chest pain free after receiving tylenol and NS bolus in the ED. Risk factors for CAD include HTN, DM, and HLD.  - No further cardiac work-up at this time.   2. Murmur on exam: patient has known heart murmur, best appreciated over R sternal border. Last echocardiogram in 2011 without mention of valvular abnormalities. Patient denies syncope/pre-syncope, exertional CP, or DOE. - Consider non-urgent outpatient echocardiogram  3. HTN: BP stable - Continue HCTZ and losartan  4. Carotid artery disease: undergoes annual carotid duplex; last duplex 04/2017 with stable mild-mod LICA stenosis  - Continue routine surveillance per Dr. Gwenlyn Found  5. HLD: Last LDL 63 04/2017 - Continue statin  6. PD: stable. Follows with [facility] - Continue Sinemet  - Continue routine follow-up with Neurology  7. DM type 2 on insulin: last A1C 6.6 on 04/2017. Goal <7. Follows with [facility] - Continue current DM regimen   For questions or updates, please contact [place] Please consult [email or web address] for contact info under Cardiology/STEMI.   Signed, Abigail Butts, PA-C  11/28/2017 11:19 AM [phone]  I have examined the patient and reviewed assessment and plan and discussed with patient.  Agree with above as stated.  Atypical right sided chest pain.  She rode an exercise bike for 45 minutes yesterday with no problems. No ischemic changes on ECG.  Negative troponins x 2.  OK to discharge.  If sx recur, could consider outpatient ischemia eval.  She last had cardiac testing in 2011.  She is comfortable with this plan.    Larae Grooms

## 2017-11-28 NOTE — ED Provider Notes (Signed)
[facility] EMERGENCY DEPARTMENT Provider Note   CSN: [id number] Arrival date & time: 11/28/17  0218     History   Chief Complaint Chief Complaint  Patient presents with  . Chest Pain    HPI Pamela Patton is a 72 y.o. female istory of diabetes on insulin, hypertension, hyperlipidemia, Parkinson's disease is here for evaluation of right-sided, non-radiating, non-exertional, nonpleuritic chest pain described as a expanding bubble that began at 6 PM yesterday while she was eating dinner. She has had 5-6 episodes, intermittent. Episodes have occurred at rest. Not sure of Pain lasts a few minutes and resolves on its own. Has taken aspirin without benefit. She denies fevers, chills, cough, shortness of breath, dizziness, abdominal pain, nausea, vomiting or radiation of chest pain anywhere. Has been at baseline in the last few weeks without recent illness.  HPI  Past Medical History:  Diagnosis Date  . Anemia   . Arthritis   . Cataract    immature   . Chest pain summer 2012  . Chicken pox   . Diverticulosis    difficulty with colonoscopy 2005  . 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 Elyse Hsu 10/08/2012  . Fibroid    fibroid tumor  . Fracture    radial/ulnar 03/2016  . GERD (gastroesophageal reflux disease)   . Heart murmur    innocent murmur  . High cholesterol   . Hypertension   . Hypothyroid, s/p radioactive iodine tx for graves 2001 08/24/2012  . Intestinal metaplasia of gastric mucosa   . Kidney disease   . PD (Parkinson's disease) ([place]) 08/08/2016  . Renal cancer ([place])   . UTI (urinary tract infection)   . Weight loss    eval with GI in 2016; s/p CT/MRI and PET scans    Patient Active Problem List   Diagnosis Date Noted  . 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 Carotid artery disease ([place]) 03/21/2017  . Hypertension associated with diabetes ([place]) 02/22/2017  . Hyperlipidemia associated with type 2  diabetes mellitus ([place]) 02/22/2017  . PD (Parkinson's disease) ([place]) 08/08/2016  . Loss of weight 07/25/2016  . 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 Elyse Hsu 10/08/2012  . CKD (chronic kidney disease) stage 3, GFR 30-59 ml/min ([facility]) 10/08/2012  . Hypothyroid, s/p radioactive iodine tx for graves 2001 - managed by endocrinologist, Dr. Elyse Hsu 08/24/2012  . Vitamin D deficiency 08/24/2012    Past Surgical History:  Procedure Laterality Date  . ABDOMINAL HYSTERECTOMY  1989  . CATARACT EXTRACTION, BILATERAL    . COLONOSCOPY    . DG BARIUM SWALLOW ([facility])     9 yrs ago per pt due to incomplete colonsocopy    OB History    No data available       Home Medications    Prior to Admission medications   Medication Sig Start Date End Date Taking? Authorizing Provider  aspirin 81 MG tablet Take 81 mg by mouth daily with lunch.    Yes [provider]  atorvastatin (LIPITOR) 40 MG tablet Take 40 mg by mouth daily with lunch.    Yes [provider]  Calcium Carbonate-Vitamin D (CALTRATE 600+D) 600-400 MG-UNIT per tablet Take 1 tablet by mouth 3 (three) times a week.    Yes [provider]  carbidopa-levodopa (SINEMET IR) 25-100 MG tablet Take 1.5 tablets by mouth 3 (three) times daily. Patient taking differently: Take 1.5 tablets by mouth 3 (three)  times daily. adminstered at 9AM, Juan Quam, 8PM 09/18/17  Yes [name], Eustace Quail, DO  Cholecalciferol (VITAMIN D PO) Take 5,000 Units by mouth 3 (three) times a week.    Yes [provider]  glucose 4 GM chewable tablet Chew 1 tablet by mouth as needed for low blood sugar.   Yes [provider]  HUMALOG KWIKPEN 100 UNIT/ML KiwkPen Inject 0-4 Units into the skin 3 (three) times daily.  07/20/16  Yes [provider]  hydrochlorothiazide (HYDRODIURIL) 25 MG tablet Take 25 mg by mouth daily with lunch.  06/29/12  Yes [provider]  insulin  glargine (LANTUS) 100 UNIT/ML injection Inject 15 Units into the skin every morning.    Yes [provider]  levothyroxine (SYNTHROID, LEVOTHROID) 75 MCG tablet Take 75 mcg by mouth daily.   Yes [provider]  losartan (COZAAR) 50 MG tablet TAKE 1 TABLET BY MOUTH DAILY Patient taking differently: TAKE 1 TABLET BY MOUTH DAILY WITH LUNCH 11/06/17  Yes Colin Benton R, DO  METFORMIN HCL PO Take 500 mg by mouth 2 (two) times daily.    Yes [provider]  Multiple Vitamins-Minerals (PRESERVISION AREDS PO) Take 1 tablet by mouth daily as needed (FOR EYES).    Yes [provider]  Nutritional Supplements (FEEDING SUPPLEMENT, GLUCERNA 1.2 CAL,) LIQD Place 237 mLs into feeding tube daily with breakfast.   Yes [provider]  Pioglitazone HCl (ACTOS PO) Take 45 mg by mouth daily with lunch.    Yes [provider]  Polyethyl Glycol-Propyl Glycol (SYSTANE) 0.4-0.3 % GEL ophthalmic gel Place 1 application into both eyes at bedtime as needed (FOR EYES).   Yes [provider]    Family History Family History  Problem Relation Age of Onset  . Cancer Mother        liver  . Liver cancer Mother   . Stroke Father 6  . Diabetes Father   . Hypertension Father   . Diabetes Maternal Grandmother   . Prostate cancer Maternal Grandfather   . Pancreatic cancer Brother   . Diabetes Sister   . Cancer Sister   . Diabetes Sister   . Colon polyps Neg Hx   . Colon cancer Neg Hx   . Esophageal cancer Neg Hx   . Rectal cancer Neg Hx   . Stomach cancer Neg Hx     Social History Social History   Tobacco Use  . Smoking status: Never Smoker  . Smokeless tobacco: Never Used  Substance Use Topics  . Alcohol use: No    Alcohol/week: 0.0 oz  . Drug use: No     Allergies   Patient has no known allergies.   Review of Systems Review of Systems  Cardiovascular: Positive for chest pain.  All other systems reviewed and are negative.    Physical  Exam Updated Vital Signs BP (!) 136/59   Pulse 60   Temp 98 F (36.7 C) (Oral)   Resp 16   SpO2 100%   Physical Exam  Constitutional: She appears well-developed and well-nourished.  NAD. Non toxic.   HENT:  Head: Normocephalic and atraumatic.  Nose: Nose normal.  Moist mucous membranes. Tonsils and oropharynx normal  Eyes: Conjunctivae, EOM and lids are normal.  Neck: Trachea normal and normal range of motion.  Neck is supple Trachea midline No cervical adenopathy  Cardiovascular: Normal rate, regular rhythm, S1 normal, S2 normal and normal heart sounds.  Pulses:      Carotid pulses are 2+  on the right side, and 2+ on the left side.      Radial pulses are 2+ on the right side, and 2+ on the left side.       Dorsalis pedis pulses are 2+ on the right side, and 2+ on the left side.  RRR. No orthopnea. No LE edema or calf tenderness.   Pulmonary/Chest: Effort normal and breath sounds normal. No respiratory distress. She has no decreased breath sounds. She has no rhonchi.  No reproducible chest wall tenderness. CP not reproducible with AROM of upper extremities. No rales or wheezing.  Abdominal: Soft. Bowel sounds are normal. There is no tenderness.  No epigastric tenderness. No distention.   Neurological: She is alert. GCS eye subscore is 4. GCS verbal subscore is 5. GCS motor subscore is 6.  Skin: Skin is warm and dry. Capillary refill takes less than 2 seconds.  No rash to chest wall  Psychiatric: She has a normal mood and affect. Her speech is normal and behavior is normal. Judgment and thought content normal. Cognition and memory are normal.     ED Treatments / Results  Labs (all labs ordered are listed, but only abnormal results are displayed) Labs Reviewed  BASIC METABOLIC PANEL - Abnormal; Notable for the following components:      Result Value   Glucose, Bld 194 (*)    BUN 29 (*)    Creatinine, Ser 1.37 (*)    GFR calc non Af Amer 37 (*)    GFR calc Af Amer 43 (*)     All other components within normal limits  CBC - Abnormal; Notable for the following components:   RBC 3.28 (*)    Hemoglobin 9.7 (*)    HCT 30.5 (*)    All other components within normal limits  TROPONIN I  I-STAT TROPONIN, ED    EKG  EKG Interpretation  Date/Time:  Tuesday November 28 2017 02:25:23 EST Ventricular Rate:  69 PR Interval:  128 QRS Duration: 84 QT Interval:  406 QTC Calculation: 435 R Axis:   39 Text Interpretation:  Normal sinus rhythm Normal ECG No significant change was found Confirmed by Ezequiel Essex [phone]) on 11/28/2017 6:46:07 AM       Radiology Dg Chest 2 View  Result Date: 11/28/2017 CLINICAL DATA:  72 year old female with chest pain. EXAM: CHEST  2 VIEW COMPARISON:  Chest CT dated 06/16/2016 FINDINGS: The lungs are clear. There is no pleural effusion or pneumothorax. Top-normal cardiac silhouette. No acute osseous pathology. IMPRESSION: No active cardiopulmonary disease. Electronically Signed   By: Anner Crete M.D.   On: 11/28/2017 03:33    Procedures Procedures (including critical care time)  Medications Ordered in ED Medications  acetaminophen (TYLENOL) tablet 1,000 mg (1,000 mg Oral Given 11/28/17 0749)  sodium chloride 0.9 % bolus 1,000 mL (0 mLs Intravenous Stopped 11/28/17 1018)     Initial Impression / Assessment and Plan / ED Course  I have reviewed the triage vital signs and the nursing notes.  Pertinent labs & imaging results that were available during my care of the patient were reviewed by me and considered in my medical decision making (see chart for details).  Clinical Course as of Nov 28 1224  Tue Nov 28, 2017  0821 Cardiology paged x 2   [CG]  0953 Re-evaluated pt. NAD. Awaiting cardiology evaluation.   [CG]  1156 Re-evaluated pt. States some cardiology "associate" saw her. Will give home medications. Awaiting cardiology recommendations.   [CG]  Clinical Course User Index [CG] Kinnie Feil, PA-C    Atypical chest pain. Pertinent risk factors include HTN, HLD, DM all well controlled and age. She has no SOB or pleuritic CP, no tachycardia or tachypnea, PE considered but unlikely. HEART score =4. Given age and risk factors will plan on delta trop and consult cardiology for recommendations. She follows with Dr Alvester Chou. No recent EKG to compare.   Final Clinical Impressions(s) / ED Diagnoses   1225: Cardiology recommending no further cardiac work up at this time. Pt deemed safe for discharge.  Discussed lower than normal Hgb. She has no diarrhea or signs of bleed. She is to f/u with PCP in 1 week for re-check. Return precautions given. Pt discussed with SP.  Final diagnoses:  Atypical chest pain    ED Discharge Orders    None       Arlean Hopping 11/28/17 1226    Carmin Muskrat, MD 11/29/17 [phone]

## 2017-11-28 NOTE — ED Notes (Signed)
Pt ambulatory to restroom with steady gait.

## 2017-12-14 ENCOUNTER — Ambulatory Visit: Payer: Medicare Other | Attending: Neurology

## 2017-12-14 ENCOUNTER — Ambulatory Visit: Payer: Medicare Other | Admitting: Physical Therapy

## 2017-12-14 ENCOUNTER — Ambulatory Visit: Payer: Medicare Other | Admitting: Occupational Therapy

## 2017-12-14 DIAGNOSIS — R278 Other lack of coordination: Secondary | ICD-10-CM | POA: Insufficient documentation

## 2017-12-14 DIAGNOSIS — R471 Dysarthria and anarthria: Secondary | ICD-10-CM | POA: Insufficient documentation

## 2017-12-14 DIAGNOSIS — R29818 Other symptoms and signs involving the nervous system: Secondary | ICD-10-CM

## 2017-12-14 DIAGNOSIS — R1310 Dysphagia, unspecified: Secondary | ICD-10-CM | POA: Insufficient documentation

## 2017-12-14 NOTE — Therapy (Signed)
[facility] [address] [place], [place], [address] Phone: [phone]   Fax:  [phone]  Patient Details  Name: Pamela Patton MRN: [id number] Date of Birth: 09/06/1946 Referring Provider:  Lucretia Kern, DO  Encounter Date: 12/14/2017  Physical Therapy Parkinson's Disease Screen  Patient reports she faithfully attends the Parkinsons Cycling class 2x/week. She also does her hand exercises with the putty. She has not continued doing the PWR! Exercises and has not made time in her schedule to attend the [employer]! Exercise classes. She reports between daily activities and trying to clean out her personal effects ("my home is like a hoarder's house"), she has not found the time. "I do better if I have it scheduled and I know I need to show up." She reports one loss of balance with fall into file cabinet (trying to step out of partially unbuttoned church robe).   Timed Up and Go test:10.31 sec, 9.97 cog (04/18/17 13.41 sec, 11.97 cog)  10 meter walk test:3.93 ft/sec  (04/18/17  3.39 ft/sec)  5 time sit to stand test:9.97 sec (04/18/17  13.94 sec)  Patient would benefit from Physical Therapy evaluation due to she has noticed a change in movement (feels she takes longer to do things and balance is slightly worse). Discussed using a short round of PT to re-learn PWR! Exercises with goal to attend classes when she discharges from PT.      Rexanne Mano, PT 12/14/2017, 8:03 AM  [facility] [address] [place], [place], [address] Phone: [phone]   Fax:  [phone]

## 2017-12-14 NOTE — Therapy (Signed)
[facility] [address] [place], [place], [address] Phone: [phone]   Fax:  [phone]  Patient Details  Name: Pamela Patton MRN: [id number] Date of Birth: 1946-04-09 Referring Provider:  [name], DO  Encounter Date: 12/14/2017  Speech Therapy Parkinson's Disease Screen   Decibel Level today: 70dB  (WNL=70-72 dB) over 10 minutes with sound level meter 30cm away from pt's mouth. Pt's conversational volume has remained the same since last treatment course. Pt has had cateract surgery on both eyes since last treatment course. Pt reports she has not done loud /a/ so SLP strongly suggested she return to this again. Pt stated she has done them in the car before, and SLP suggested she continue with this routine; put a reminder on her dashboard to complete.   Pt does not report difficulty in swallowing function warranting objective evaluation.  Pt does does not require speech therapy services at this time. Recommend ST screen in another 4-6 months    [facility] ,[place], [place]  12/14/2017, 8:10 AM  [facility] [address] [place], [place], [address] Phone: [phone]   Fax:  [phone]

## 2017-12-14 NOTE — Therapy (Signed)
[facility] [address] [place], [place], [address] Phone: [phone]   Fax:  [phone]  Patient Details  Name: Pamela Patton MRN: [id number] Date of Birth: 03-09-1946 Referring Provider:  Lucretia Kern, DO  Encounter Date: 12/14/2017 Occupational Therapy Parkinson's Disease Screen  Hand dominance:  LUE   Physical Performance Test item #2 (simulated eating):  14.10 sec  Physical Performance Test item #4 (donning/doffing jacket):  21.84 sec  9-hole peg test:    RUE 33.34         LUE  40.66 secs    Change in ability to perform ADLs/IADLs:  Pt reports ADLS take her longer and she is not consistently performing HEP.  Pt would benefit from occupational therapy evaluation due to  Pt reported decline in ADLs    RINE,KATHRYN 12/14/2017, 8:37 AM  [facility] [address] [place], [place], [address] Phone: [phone]   Fax:  [phone]

## 2017-12-15 ENCOUNTER — Telehealth: Payer: Self-pay | Admitting: Neurology

## 2017-12-15 DIAGNOSIS — G20 Parkinson's disease: Secondary | ICD-10-CM

## 2017-12-15 NOTE — Telephone Encounter (Signed)
Order entered

## 2017-12-15 NOTE — Telephone Encounter (Signed)
-----   Message from Hulda Marin, OT sent at 12/14/2017  5:18 PM EDT ----- Dr. Hilda Blades was seen for Parkinson's screens today. She can benefit from PT/OT.  If you agree, please make this referral. Thanks, Curt Bears Rine,OTR/L

## 2018-01-16 ENCOUNTER — Encounter: Payer: Self-pay | Admitting: Family Medicine

## 2018-01-16 ENCOUNTER — Ambulatory Visit: Payer: Medicare Other | Admitting: Family Medicine

## 2018-01-16 VITALS — BP 118/70 | HR 65 | Temp 98.4°F | Ht 66.0 in | Wt 140.9 lb

## 2018-01-16 DIAGNOSIS — D649 Anemia, unspecified: Secondary | ICD-10-CM | POA: Diagnosis not present

## 2018-01-16 DIAGNOSIS — E785 Hyperlipidemia, unspecified: Secondary | ICD-10-CM | POA: Diagnosis not present

## 2018-01-16 DIAGNOSIS — I152 Hypertension secondary to endocrine disorders: Secondary | ICD-10-CM

## 2018-01-16 DIAGNOSIS — E1159 Type 2 diabetes mellitus with other circulatory complications: Secondary | ICD-10-CM

## 2018-01-16 DIAGNOSIS — I739 Peripheral vascular disease, unspecified: Secondary | ICD-10-CM

## 2018-01-16 DIAGNOSIS — I10 Essential (primary) hypertension: Secondary | ICD-10-CM

## 2018-01-16 DIAGNOSIS — E1169 Type 2 diabetes mellitus with other specified complication: Secondary | ICD-10-CM

## 2018-01-16 DIAGNOSIS — E1129 Type 2 diabetes mellitus with other diabetic kidney complication: Secondary | ICD-10-CM

## 2018-01-16 DIAGNOSIS — I779 Disorder of arteries and arterioles, unspecified: Secondary | ICD-10-CM | POA: Diagnosis not present

## 2018-01-16 DIAGNOSIS — N183 Chronic kidney disease, stage 3 unspecified: Secondary | ICD-10-CM

## 2018-01-16 DIAGNOSIS — Z794 Long term (current) use of insulin: Secondary | ICD-10-CM

## 2018-01-16 DIAGNOSIS — E039 Hypothyroidism, unspecified: Secondary | ICD-10-CM

## 2018-01-16 DIAGNOSIS — G20 Parkinson's disease: Secondary | ICD-10-CM

## 2018-01-16 LAB — CBC WITH DIFFERENTIAL/PLATELET
Basophils Absolute: 0 10*3/uL (ref 0.0–0.1)
Basophils Relative: 0.8 % (ref 0.0–3.0)
EOS ABS: 0 10*3/uL (ref 0.0–0.7)
Eosinophils Relative: 0.5 % (ref 0.0–5.0)
HEMATOCRIT: 31.4 % — AB (ref 36.0–46.0)
Hemoglobin: 10.4 g/dL — ABNORMAL LOW (ref 12.0–15.0)
LYMPHS PCT: 23.2 % (ref 12.0–46.0)
Lymphs Abs: 1 10*3/uL (ref 0.7–4.0)
MCHC: 33 g/dL (ref 30.0–36.0)
MCV: 90.5 fl (ref 78.0–100.0)
Monocytes Absolute: 0.3 10*3/uL (ref 0.1–1.0)
Monocytes Relative: 8.1 % (ref 3.0–12.0)
Neutro Abs: 2.9 10*3/uL (ref 1.4–7.7)
Neutrophils Relative %: 67.4 % (ref 43.0–77.0)
Platelets: 188 10*3/uL (ref 150.0–400.0)
RBC: 3.47 Mil/uL — ABNORMAL LOW (ref 3.87–5.11)
RDW: 15.1 % (ref 11.5–15.5)
WBC: 4.3 10*3/uL (ref 4.0–10.5)

## 2018-01-16 LAB — BASIC METABOLIC PANEL
BUN: 23 mg/dL (ref 6–23)
CHLORIDE: 104 meq/L (ref 96–112)
CO2: 30 meq/L (ref 19–32)
CREATININE: 1.22 mg/dL — AB (ref 0.40–1.20)
Calcium: 10 mg/dL (ref 8.4–10.5)
GFR: 55.52 mL/min — ABNORMAL LOW (ref >60.00)
Glucose, Bld: 183 mg/dL — ABNORMAL HIGH (ref 70–99)
Potassium: 4.6 mEq/L (ref 3.5–5.1)
Sodium: 141 mEq/L (ref 135–145)

## 2018-01-16 NOTE — Progress Notes (Signed)
HPI:  Using dictation device. Unfortunately this device frequently misinterprets words/phrases.  Pamela Patton is a pleasant 72 y.o. here for follow up. Chronic medical problems summarized below were reviewed for changes.  Seen in the emergency room in February for some chest pain.  She was evaluated by cardiology whom she also sees outpatient. Had negative EKG and troponins.  Reports she has not had any symptoms since.  She did have a lower than usual hemoglobin.  She takes aspirin, which she reports she was told to take because of her diabetes.  She has a listed history of anemia in the chart, but she denies ever having any anemia in the past.  There is a listed diagnosis of carotid artery disease in her chart, however the notes from her cardiologist from her last duplex state that the study was essentially normal.    Denies CP, SOB, DOE, treatment intolerance or new symptoms.  She denies melena, change in bowels, hematochezia, bleeding or bruising. Reports she had her labs with her endocrinologist in February.  HTN: -meds: Aspirin, losartan 50, hctz 25  Diabetes w/ CKD: -meds: humalog, lantus, metformin, pioglitazone, asa, arb -sees Dr. Elyse Hsu for management  Hypothyroidism: -meds: levothyroxine -sees Dr. Elyse Hsu for management  HLD: -meds: atorvastatin  Parkinson Dz: -sees Dr. Carles Collet for management -meds: sinemet   ROS: See pertinent positives and negatives per HPI.  Past Medical History:  Diagnosis Date  . Anemia   . Arthritis   . Cataract    immature   . Chest pain summer 2012  . Chicken pox   . Diverticulosis    difficulty with colonoscopy 2005  . 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 Elyse Hsu 10/08/2012  . Fibroid    fibroid tumor  . Fracture    radial/ulnar 03/2016  . GERD (gastroesophageal reflux disease)   . Heart murmur    innocent murmur  . High cholesterol   . Hypertension   . Hypothyroid, s/p  radioactive iodine tx for graves 2001 08/24/2012  . Intestinal metaplasia of gastric mucosa   . Kidney disease   . PD (Parkinson's disease) ([facility]) 08/08/2016  . Renal cancer ([facility])   . UTI (urinary tract infection)   . Weight loss    eval with GI in 2016; s/p CT/MRI and PET scans    Past Surgical History:  Procedure Laterality Date  . ABDOMINAL HYSTERECTOMY  1989  . CATARACT EXTRACTION, BILATERAL    . COLONOSCOPY    . DG BARIUM SWALLOW ([facility])     9 yrs ago per pt due to incomplete colonsocopy    Family History  Problem Relation Age of Onset  . Cancer Mother        liver  . Liver cancer Mother   . Stroke Father 24  . Diabetes Father   . Hypertension Father   . Diabetes Maternal Grandmother   . Prostate cancer Maternal Grandfather   . Pancreatic cancer Brother   . Diabetes Sister   . Cancer Sister   . Diabetes Sister   . Colon polyps Neg Hx   . Colon cancer Neg Hx   . Esophageal cancer Neg Hx   . Rectal cancer Neg Hx   . Stomach cancer Neg Hx     SOCIAL HX: See HPI   Current Outpatient Medications:  .  aspirin 81 MG tablet, Take 81 mg by mouth daily with lunch. , Disp: , Rfl:  .  atorvastatin (LIPITOR) 40 MG tablet, Take  40 mg by mouth daily with lunch. , Disp: , Rfl:  .  Calcium Carbonate-Vitamin D (CALTRATE 600+D) 600-400 MG-UNIT per tablet, Take 1 tablet by mouth 3 (three) times a week. , Disp: , Rfl:  .  carbidopa-levodopa (SINEMET IR) 25-100 MG tablet, Take 1.5 tablets by mouth 3 (three) times daily. (Patient taking differently: Take 1.5 tablets by mouth 3 (three) times daily. adminstered at 9AM, 3PM, 8PM), Disp: 405 tablet, Rfl: 1 .  Cholecalciferol (VITAMIN D PO), Take 5,000 Units by mouth 3 (three) times a week. , Disp: , Rfl:  .  glucose 4 GM chewable tablet, Chew 1 tablet by mouth as needed for low blood sugar., Disp: , Rfl:  .  HUMALOG KWIKPEN 100 UNIT/ML KiwkPen, Inject 0-4 Units into the skin 3 (three) times daily. , Disp: , Rfl:  .  hydrochlorothiazide  (HYDRODIURIL) 25 MG tablet, Take 25 mg by mouth daily with lunch. , Disp: , Rfl:  .  insulin glargine (LANTUS) 100 UNIT/ML injection, Inject 15 Units into the skin every morning. , Disp: , Rfl:  .  levothyroxine (SYNTHROID, LEVOTHROID) 75 MCG tablet, Take 75 mcg by mouth daily., Disp: , Rfl:  .  losartan (COZAAR) 50 MG tablet, TAKE 1 TABLET BY MOUTH DAILY (Patient taking differently: TAKE 1 TABLET BY MOUTH DAILY WITH LUNCH), Disp: 90 tablet, Rfl: 1 .  METFORMIN HCL PO, Take 500 mg by mouth 2 (two) times daily. , Disp: , Rfl:  .  Multiple Vitamins-Minerals (PRESERVISION AREDS PO), Take 1 tablet by mouth daily as needed (FOR EYES). , Disp: , Rfl:  .  Nutritional Supplements (FEEDING SUPPLEMENT, GLUCERNA 1.2 CAL,) LIQD, Place 237 mLs into feeding tube daily with breakfast., Disp: , Rfl:  .  Pioglitazone HCl (ACTOS PO), Take 45 mg by mouth daily with lunch. , Disp: , Rfl:  .  Polyethyl Glycol-Propyl Glycol (SYSTANE) 0.4-0.3 % GEL ophthalmic gel, Place 1 application into both eyes at bedtime as needed (FOR EYES)., Disp: , Rfl:   Current Facility-Administered Medications:  .  0.9 %  sodium chloride infusion, 500 mL, Intravenous, Continuous, Armbruster, Carlota Raspberry, MD  EXAM:  Vitals:   01/16/18 1131  BP: 118/70  Pulse: 65  Temp: 98.4 F (36.9 C)    Body mass index is 22.74 kg/m.  GENERAL: vitals reviewed and listed above, alert, oriented, appears well hydrated and in no acute distress  HEENT: atraumatic, conjunttiva clear, no obvious abnormalities on inspection of external nose and ears  NECK: no obvious masses on inspection  LUNGS: clear to auscultation bilaterally, no wheezes, rales or rhonchi, good air movement  CV: HRRR, no peripheral edema  MS: moves all extremities without noticeable abnormality  PSYCH: pleasant and cooperative, no obvious depression or anxiety  ASSESSMENT AND PLAN:  Discussed the following assessment and plan:  Anemia, unspecified type  Hypertension  associated with diabetes ([facility])  Hyperlipidemia associated with type 2 diabetes mellitus ([facility])  Type 2 diabetes mellitus with other diabetic kidney complication, with long-term current use of insulin ([facility])  CKD (chronic kidney disease) stage 3, GFR 30-59 ml/min (H[facility])  Hypothyroidism, unspecified type  Carotid artery disease, unspecified laterality, unspecified type ([facility])  PD (Parkinson's disease) ([place])  Labs to recheck the anemia, recommended stool cards if still anemic and further-anemia lab test Recommended assistant to obtain labs from her endocrinologist and abstract Blood pressure is good today. She reports she has been exercising on a regular basis in a cycling class and has no chest pain or symptoms with exercise. Discussed  recent studies and aspirin.  Discussed risk and benefits.  Did recommend she ask her cardiologist prior in stopping because of the questionable history of carotid artery disease in the chart.  However the last notes from the cardiologist on her most recent duplex states that this study was essentially normal. Annual wellness visit and follow-up this summer. -Patient advised to return or notify a doctor immediately if symptoms worsen or persist or new concerns arise.  Patient Instructions  BEFORE YOU LEAVE: -update/abstract hgba1c, labs from 11/2017 endocrine visit -labs -AWV susan and follow up Dr. Maudie Mercury on a Tuesday in august  We have ordered labs or studies at this visit. It can take up to 1-2 weeks for results and processing. IF results require follow up or explanation, we will call you with instructions. Clinically stable results will be released to your [facility]. If you have not heard from [place] or cannot find your results in [facility] in 2 weeks please contact our office at [phone].  If you are not yet signed up for [facility], please consider signing up.  Please consider stopping the aspirin as we discussed.         Lucretia Kern, DO

## 2018-01-16 NOTE — Patient Instructions (Addendum)
BEFORE YOU LEAVE: -update/abstract hgba1c, labs from 11/2017 endocrine visit -labs -AWV susan and follow up Dr. Maudie Mercury on a Tuesday in august  We have ordered labs or studies at this visit. It can take up to 1-2 weeks for results and processing. IF results require follow up or explanation, we will call you with instructions. Clinically stable results will be released to your [facility]. If you have not heard from [place] or cannot find your results in [facility] in 2 weeks please contact our office at [phone].  If you are not yet signed up for [facility], please consider signing up.  Check with your cardiologist about stopping the aspirin.

## 2018-01-22 LAB — HM DIABETES EYE EXAM

## 2018-02-08 LAB — LIPID PANEL
Cholesterol: 131 (ref 0–200)
HDL: 68 (ref 35–70)
LDL CALC: 54
Triglycerides: 45 (ref 40–160)

## 2018-02-08 LAB — HEMOGLOBIN A1C: HEMOGLOBIN A1C: 7.4

## 2018-02-13 DIAGNOSIS — Z79899 Other long term (current) drug therapy: Secondary | ICD-10-CM | POA: Insufficient documentation

## 2018-02-15 NOTE — Progress Notes (Deleted)
Pamela Patton was seen today in the movement disorders clinic for neurologic consultation at the request of Lucretia Kern, DO.  The consultation is for the evaluation of gait change, cogwheel rigidity and to r/o PD.  The records that were made available to me were reviewed.  Pt reports that she has had unsteadiness at least since June.  She fell then and broke her radius/ulna.  States that she turned around at the sink and thinks that she tripped over her feet and "raggady bottom shoes"  10/11/16 update:  The patient follows up today.  I started her on carbidopa/levodopa 25/100 and she has worked up to one tablet 3 times daily.  "I think that it is helping."  When first started the whole tablets she had mild nausea for 5 minutes only but that is better.  Pt denies falls.  Pt denies lightheadedness, near syncope.  No hallucinations.  Mood has been "pretty good." The patient has been attending therapy at the outpatient rehabilitation center for physical, occupational and speech therapy. She will finish up OT/PT this week and has ST until second week of February.   I have reviewed notes.   A MBE was requested by speech therapy and this is scheduled for 10/13/2016.  No other CV exercise besides for therapy.    02/08/17 update:  Patient seen in follow-up today.  States that "I feel perfectly fine this AM but this past weekend I had abdominal and back pain."  Was better if relaxed and stretched.  She is unsure if it is related to timing of levodopa.   Admits that she was raking leaves a few days before when it started.   She is on carbidopa/levodopa 25/100, one tablet 3 times per day (7-9am/2-3pm/7pm).  She had a modified barium swallow on 10/13/2016 that demonstrated mild pharyngeal phase dysphagia.  Regular diet with thin liquids was recommended.  She had one fall in her sleep and fell off of the bed.  05/19/17 update:  Pt f/u today for Parkinsonism.  The records that were made available to me were reviewed since  last visit.  On carbidopa/levodopa 25/100 tid (8-8:30/12-12:30/6:00).  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  About 2 weeks ago, she did have a very vivid dream and she states that in general "I have been sleeping really nicely."   The records that were made available to me were reviewed, including therapy notes.  States was taken off of norvasc as BP's were going too low.  Wearing off:  No.  How long before next dose:  n/a Falls:   No. N/V:  No. Hallucinations:  No.  visual distortions: No. Lightheaded:  No.  Syncope: No. Dyskinesia:  No.   09/18/17 update: Patient seen today in follow-up for Parkinson's.  I have reviewed her records available to me since last visit.  I increased her levodopa last visit to carbidopa/levodopa 25/100, 1.5 tablets 3 times per day.  She states that she only ended up increasing the AM one as she thought that it was for tremor and it did help going up.  She did go to outpatient rehab since our last visit.  Those records are reviewed.  She was exercising with Parkinson's cycle class but she had cataract surgery and says she has been a little "off" with that.  She is back to going now and "it makes me feel good."  She has had no falls.  No lightheadedness or near syncope.  No hallucinations.  She denies any swallowing issues.  She has been seeing endocrinology for her diabetes.  02/19/18 update: Patient is seen today in follow-up for parkinsonism.  I increased her carbidopa/levodopa 25/1 100 to 1.5 tablet 3 times per day.  Records have been reviewed.  Last saw primary care on January 16, 2018.  ALLERGIES:  No Known Allergies  CURRENT MEDICATIONS:  Outpatient Encounter Medications as of 02/19/2018  Medication Sig  . aspirin 81 MG tablet Take 81 mg by mouth daily with lunch.   Marland Kitchen atorvastatin (LIPITOR) 40 MG tablet Take 40 mg by mouth daily with lunch.   . Calcium Carbonate-Vitamin D (CALTRATE 600+D)  600-400 MG-UNIT per tablet Take 1 tablet by mouth 3 (three) times a week.   . carbidopa-levodopa (SINEMET IR) 25-100 MG tablet Take 1.5 tablets by mouth 3 (three) times daily. (Patient taking differently: Take 1.5 tablets by mouth 3 (three) times daily. adminstered at [place], [place], 8PM)  . Cholecalciferol (VITAMIN D PO) Take 5,000 Units by mouth 3 (three) times a week.   Marland Kitchen glucose 4 GM chewable tablet Chew 1 tablet by mouth as needed for low blood sugar.  Marland Kitchen HUMALOG KWIKPEN 100 UNIT/ML KiwkPen Inject 0-4 Units into the skin 3 (three) times daily.   . hydrochlorothiazide (HYDRODIURIL) 25 MG tablet Take 25 mg by mouth daily with lunch.   . insulin glargine (LANTUS) 100 UNIT/ML injection Inject 15 Units into the skin every morning.   Marland Kitchen levothyroxine (SYNTHROID, LEVOTHROID) 75 MCG tablet Take 75 mcg by mouth daily.  Marland Kitchen losartan (COZAAR) 50 MG tablet TAKE 1 TABLET BY MOUTH DAILY (Patient taking differently: TAKE 1 TABLET BY MOUTH DAILY WITH LUNCH)  . METFORMIN HCL PO Take 500 mg by mouth 2 (two) times daily.   . Multiple Vitamins-Minerals (PRESERVISION AREDS PO) Take 1 tablet by mouth daily as needed (FOR EYES).   . Nutritional Supplements (FEEDING SUPPLEMENT, GLUCERNA 1.2 CAL,) LIQD Place 237 mLs into feeding tube daily with breakfast.  . Pioglitazone HCl (ACTOS PO) Take 45 mg by mouth daily with lunch.   Vladimir Faster Glycol-Propyl Glycol (SYSTANE) 0.4-0.3 % GEL ophthalmic gel Place 1 application into both eyes at bedtime as needed (FOR EYES).   Facility-Administered Encounter Medications as of 02/19/2018  Medication  . 0.9 %  sodium chloride infusion    PAST MEDICAL HISTORY:   Past Medical History:  Diagnosis Date  . Anemia   . Arthritis   . Cataract    immature   . Chest pain summer 2012  . Chicken pox   . Diverticulosis    difficulty with colonoscopy 2005  . 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 Elyse Hsu 10/08/2012  . Fibroid    fibroid tumor    . Fracture    radial/ulnar 03/2016  . GERD (gastroesophageal reflux disease)   . Heart murmur    innocent murmur  . High cholesterol   . Hypertension   . Hypothyroid, s/p radioactive iodine tx for graves 2001 08/24/2012  . Intestinal metaplasia of gastric mucosa   . Kidney disease   . PD (Parkinson's disease) ([place]) 08/08/2016  . Renal cancer ([place])   . UTI (urinary tract infection)   . Weight loss    eval with GI in 2016; s/p CT/MRI and PET scans    PAST SURGICAL HISTORY:   Past Surgical History:  Procedure Laterality Date  . ABDOMINAL HYSTERECTOMY  1989  .  CATARACT EXTRACTION, BILATERAL    . COLONOSCOPY    . DG BARIUM SWALLOW ([place] HX)     9 yrs ago per pt due to incomplete colonsocopy    SOCIAL HISTORY:   Social History   Socioeconomic History  . Marital status: Single    Spouse name: Not on file  . Number of children: Not on file  . Years of education: Not on file  . Highest education level: Not on file  Occupational History  . 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 Financial resource strain: Not on file  . Food insecurity:    Worry: Not on file    Inability: Not on file  . Transportation needs:    Medical: Not on file    Non-medical: Not on file  Tobacco Use  . Smoking status: Never Smoker  . Smokeless tobacco: Never Used  Substance and Sexual Activity  . Alcohol use: No    Alcohol/week: 0.0 oz  . Drug use: No  . Sexual activity: Not on file  Lifestyle  . Physical activity:    Days per week: Not on file    Minutes per session: Not on file  . Stress: Not on file  Relationships  . Social connections:    Talks on phone: Not on file    Gets together: Not on file    Attends religious service: Not on file    Active member of club or organization: Not on file    Attends meetings of clubs or organizations: Not on file    Relationship status: Not on file  . Intimate partner violence:    Fear of  current or ex partner: Not on file    Emotionally abused: Not on file    Physically abused: Not on file    Forced sexual activity: Not on file  Other Topics Concern  . 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 Mother  Deceased  . Father  Deceased  . MGM  Deceased  . MGF  Deceased  . PGM  Deceased  . PGF  Deceased  . Brother  Deceased  . Sister  Alive  . Sister  Deceased  . Sister  Alive  . Neg Hx  (Not Specified)    ROS:  A complete 10 system review of systems was obtained and was unremarkable apart from what is mentioned above.  PHYSICAL EXAMINATION:    VITALS:   There were no vitals filed for this visit. Wt Readings from Last 3 Encounters:  01/16/18 140 lb 14.4 oz (63.9 kg)  09/18/17 138 lb (62.6 kg)  09/04/17 140 lb 12.8 oz (63.9 kg)    GEN:  The patient appears stated age and is in NAD. HEENT:  Normocephalic, atraumatic.  The mucous membranes are moist. The superficial temporal arteries are without ropiness or tenderness. CV:  RRR Lungs:  CTAB Neck/HEME:  There are no carotid bruits bilaterally.  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  Neurological examination:  Orientation: The patient is alert and oriented x3.  Cranial nerves: There is good facial symmetry. There is facial hypomimia.   The speech is fluent and clear. Speech is pseudobulbar in quality.  Soft palate rises symmetrically and there is no tongue deviation. Hearing is intact to conversational tone. Sensation: Sensation is intact to light and pinprick throughout (facial, trunk,  extremities). Vibration is intact at the bilateral big toe. There is no extinction with double simultaneous stimulation. There is no sensory dermatomal level identified. Motor: Strength is 5/5 in the bilateral upper and lower extremities with the exception of abduction of fingers on the left and strength there is 4/5.   Shoulder shrug is equal and symmetric.  There is no pronator drift.   Movement examination: Tone: There is mild increased tone in the LUE Abnormal movements: There is rare tremor in the L hand and in the chin.  Occasional tremor in the legs.   Coordination:  There is decremation with any form of RAMS, including alternating supination and pronation of the forearm, hand opening and closing, finger taps, heel taps and toe taps bilaterally, L more than right Gait and Station: The patient has no significant difficulty arising out of a deep-seated chair without the use of the hands. The patient's stride length is normal with slight decreased arm swing bilaterally  LABS    Chemistry      Component Value Date/Time   NA 141 01/16/2018 1205   NA 142 04/27/2017   K 4.6 01/16/2018 1205   CL 104 01/16/2018 1205   CO2 30 01/16/2018 1205   BUN 23 01/16/2018 1205   BUN 23 (A) 04/27/2017   CREATININE 1.22 (H) 01/16/2018 1205   GLU 154 04/27/2017      Component Value Date/Time   CALCIUM 10.0 01/16/2018 1205   ALKPHOS 55 04/27/2017   AST 14 04/27/2017   ALT 10 04/27/2017   BILITOT 0.6 06/03/2016 1444     Lab Results  Component Value Date   TSH 0.30 (A) 04/27/2017   No results found for: VITAMINB12   ASSESSMENT/PLAN:  1.  Parkinsonism.  I suspect that this does represent idiopathic Parkinson's disease but I do think that an atypical state like PSP is in the ddx given diplopia and pseudobulbar speech.  However, tremor is somewhat atypical in PSP.  I discussed with her that time will help [place] differentiate the 2.  In addition, I told her that treatments were the same, although I  told her that prognosis was very different.  -my intention last visit was to increase carbidopa/levodopa 25/100 to 1.5 tablets tid but she only did carbidopa/levodopa 25/100 to 1.5/1/1.  Told her to go ahead and make all dosages 1.5 tablets.  She was agreeable.  Risks, benefits, side effects and alternative therapies were discussed.  The opportunity to ask questions was given and they were answered to the best of my ability.  The patient expressed understanding and willingness to follow the outlined treatment protocols.  -she is back to [facility] cycle.  Discussed importance of continuing.  She enjoys the class  2.  Dysphagia  -She had a modified barium swallow on 10/13/2016 that demonstrated mild pharyngeal phase dysphagia.  Regular diet with thin liquids was recommended.  3.  Low blood pressure  -she has been taken off of 2 of BP meds and feels better.  Only on HCTZ now.  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  RBD  -watching closely. She doesn't want more medication but was agreeable to trying safety rails and talked to her about buying them on [place]    5. Follow up is anticipated in the next few months, sooner should new neurologic issues arise.  Much greater than 50% of this visit was spent in counseling and coordinating care.  Total face to face time:  25 min     Cc:  Lucretia Kern, DO

## 2018-02-19 ENCOUNTER — Ambulatory Visit: Payer: Medicare Other | Admitting: Neurology

## 2018-03-16 ENCOUNTER — Other Ambulatory Visit: Payer: Self-pay | Admitting: Neurology

## 2018-03-22 ENCOUNTER — Ambulatory Visit: Payer: Medicare Other | Admitting: Gastroenterology

## 2018-03-22 ENCOUNTER — Encounter: Payer: Self-pay | Admitting: Gastroenterology

## 2018-03-22 ENCOUNTER — Other Ambulatory Visit (INDEPENDENT_AMBULATORY_CARE_PROVIDER_SITE_OTHER): Payer: Medicare Other

## 2018-03-22 VITALS — BP 116/56 | HR 72 | Ht 64.5 in | Wt 142.0 lb

## 2018-03-22 DIAGNOSIS — D649 Anemia, unspecified: Secondary | ICD-10-CM | POA: Diagnosis not present

## 2018-03-22 DIAGNOSIS — K31A0 Gastric intestinal metaplasia, unspecified: Secondary | ICD-10-CM

## 2018-03-22 DIAGNOSIS — K3189 Other diseases of stomach and duodenum: Secondary | ICD-10-CM

## 2018-03-22 LAB — RETICULOCYTES
ABS RETIC: 33400 {cells}/uL (ref 20000–8000)
RETIC CT PCT: 1 %

## 2018-03-22 LAB — VITAMIN B12: Vitamin B-12: 539 pg/mL (ref 211–911)

## 2018-03-22 LAB — FOLATE: FOLATE: 22.6 ng/mL (ref >5.9)

## 2018-03-22 LAB — FERRITIN: Ferritin: 12.6 ng/mL (ref 10.0–291.0)

## 2018-03-22 NOTE — Progress Notes (Signed)
HPI :  72 year old female with a history of chronic anemia, Parkinson's, history of small renal cell carcinoma, history of gastric intestinal metaplasia, here for a follow-up visit for anemia.  She's had an anemia dating back until at least 2011 from what I can gather from chart. Normocytic anemia hemoglobin ranging between 10 and 11 mostly. A few months ago her hemoglobin was 9.7, 1 last checked it had risen to 10.4. MCV in 90s. He denies any blood in her stools. She states her bowel function remains normal for the most part. She is a history of Parkinson's disease and feels that levodopa can sometimes cause her bowels to be regular and causes some dyspepsia at times. She denies any abdominal pains otherwise. She states she is eating okay but has a good appetite. She states she eats better someone else cooks for her, she had a hard time cooking in light of her Parkinson's. She had an EGD and colonoscopy in October 2017 as outlined below.  EGD 07/28/16 - 2 small gastric polyps, otherwise normal exam - biopsies show chronic gastritis with intestinal metaplasia Colonoscopy 07/28/16 - one adenoma, one hyperplastic polyp, diverticulosis   Past Medical History:  Diagnosis Date  . Anemia   . Arthritis   . Cataract    immature   . Chest pain summer 2012  . Chicken pox   . Diverticulosis    difficulty with colonoscopy 2005  . 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 Elyse Hsu 10/08/2012  . Fibroid    fibroid tumor  . Fracture    radial/ulnar 03/2016  . GERD (gastroesophageal reflux disease)   . Heart murmur    innocent murmur  . High cholesterol   . Hypertension   . Hypothyroid, s/p radioactive iodine tx for graves 2001 08/24/2012  . Intestinal metaplasia of gastric mucosa   . Kidney disease   . PD (Parkinson's disease) ([place]) 08/08/2016  . Renal cancer ([place])   . UTI (urinary tract infection)   . Weight loss    eval with GI in 2016; s/p  CT/MRI and PET scans     Past Surgical History:  Procedure Laterality Date  . ABDOMINAL HYSTERECTOMY  1989  . CATARACT EXTRACTION, BILATERAL    . COLONOSCOPY    . DG BARIUM SWALLOW ([place] HX)     9 yrs ago per pt due to incomplete colonsocopy   Family History  Problem Relation Age of Onset  . Cancer Mother        liver  . Liver cancer Mother   . Stroke Father 38  . Diabetes Father   . Hypertension Father   . Diabetes Maternal Grandmother   . Prostate cancer Maternal Grandfather   . Pancreatic cancer Brother   . Diabetes Sister   . Cancer Sister   . Diabetes Sister   . Colon polyps Neg Hx   . Colon cancer Neg Hx   . Esophageal cancer Neg Hx   . Rectal cancer Neg Hx   . Stomach cancer Neg Hx    Social History   Tobacco Use  . Smoking status: Never Smoker  . Smokeless tobacco: Never Used  Substance Use Topics  . Alcohol use: No    Alcohol/week: 0.0 oz  . Drug use: No   Current Outpatient Medications  Medication Sig Dispense Refill  . aspirin 81 MG tablet Take 81 mg by mouth daily with lunch.     Marland Kitchen atorvastatin (LIPITOR) 40 MG tablet Take 40 mg  by mouth daily with lunch.     . Calcium Carbonate-Vitamin D (CALTRATE 600+D) 600-400 MG-UNIT per tablet Take 1 tablet by mouth 3 (three) times a week.     . carbidopa-levodopa (SINEMET IR) 25-100 MG tablet Take 1.5 tablets by mouth 3 (three) times daily. 135 tablet 0  . Cholecalciferol (VITAMIN D PO) Take 5,000 Units by mouth 3 (three) times a week.     . Cyanocobalamin (VITAMIN B12 PO) Take 1 tablet by mouth daily.    Marland Kitchen glucose 4 GM chewable tablet Chew 1 tablet by mouth as needed for low blood sugar.    Marland Kitchen HUMALOG KWIKPEN 100 UNIT/ML KiwkPen Inject 0-4 Units into the skin 3 (three) times daily with meals.     . hydrochlorothiazide (HYDRODIURIL) 25 MG tablet Take 25 mg by mouth daily with lunch.     . insulin glargine (LANTUS) 100 UNIT/ML injection Inject 15 Units into the skin every evening.     Marland Kitchen levothyroxine (SYNTHROID,  LEVOTHROID) 75 MCG tablet Take 75 mcg by mouth daily.    Marland Kitchen losartan (COZAAR) 50 MG tablet TAKE 1 TABLET BY MOUTH DAILY 90 tablet 1  . METFORMIN HCL PO Take 500 mg by mouth 2 (two) times daily.     . Multiple Vitamins-Minerals (PRESERVISION AREDS PO) Take 1 tablet by mouth daily as needed (FOR EYES).     . Nutritional Supplements (FEEDING SUPPLEMENT, GLUCERNA 1.2 CAL,) LIQD Place 237 mLs into feeding tube daily with breakfast.    . Pioglitazone HCl (ACTOS PO) Take 45 mg by mouth daily with lunch.     Vladimir Faster Glycol-Propyl Glycol (SYSTANE) 0.4-0.3 % GEL ophthalmic gel Place 1 application into both eyes at bedtime as needed (FOR EYES).     Current Facility-Administered Medications  Medication Dose Route Frequency Provider Last Rate Last Dose  . 0.9 %  sodium chloride infusion  500 mL Intravenous Continuous Josefa [place], Carlota Raspberry, MD       No Known Allergies   Review of Systems: All systems reviewed and negative except where noted in HPI.   Lab Results  Component Value Date   WBC 4.3 01/16/2018   HGB 10.4 (L) 01/16/2018   HCT 31.4 (L) 01/16/2018   MCV 90.5 01/16/2018   PLT 188.0 01/16/2018    Lab Results  Component Value Date   CREATININE 1.22 (H) 01/16/2018   BUN 23 01/16/2018   NA 141 01/16/2018   K 4.6 01/16/2018   CL 104 01/16/2018   CO2 30 01/16/2018    Lab Results  Component Value Date   ALT 10 04/27/2017   AST 14 04/27/2017   ALKPHOS 55 04/27/2017   BILITOT 0.6 06/03/2016     Physical Exam: BP (!) 116/56 (BP Location: Left Arm, Patient Position: Sitting, Cuff Size: Normal)   Pulse 72   Ht 5' 4.5" (1.638 m) Comment: height measured without shoes  Wt 142 lb (64.4 kg)   BMI 24.00 kg/m  Constitutional: Pleasant,well-developed, female in no acute distress. HEENT: Normocephalic and atraumatic. Conjunctivae are normal. No scleral icterus. Neck supple.  Cardiovascular: Normal rate, regular rhythm.  Pulmonary/chest: Effort normal and breath sounds normal. No  wheezing, rales or rhonchi. Abdominal: Soft, nondistended, nontender.  There are no masses palpable. No hepatomegaly. Extremities: no edema Lymphadenopathy: No cervical adenopathy noted. Neurological: Alert and oriented to person place and time. Skin: Skin is warm and dry. No rashes noted. Psychiatric: Normal mood and affect. Behavior is normal.   ASSESSMENT AND PLAN: 72 year old female with medical history as  outlined above, here for reassessment following issues:  Anemia - chronic normocytic anemia, unclear etiology. Will send some labs today to include iron studies, B12/folate, reticulocyte count to initially evaluate. Her thyroid has been checked relatively recently. I'm not convinced this is due to GI tract loss, he did not have any overt blood in her stool. She's had a relatively recent upper and lower endoscopy in the setting of anemia which did not show any clear cause. If she has an iron deficiency we may consider reassessment of her bowel. She agreed with the plan, will await labs initially make a decision on how to proceed.  Gastric intestinal metaplasia - she had gastric intestinal metaplasia noted on multiple biopsies of her stomach on prior EGD. We discussed this finding and potential increased risk for gastric cancer. She has no family history or other risk factors for gastric cancer. We discussed how there are no clear guidelines and long-term management for this condition in the [place] however in [place] and [place] surveillance endoscopy is recommended. I offered her surveillance endoscopy with mapping procedure previously and she did not follow up. We discussed this issue again. She is interested in a surveillance exam although did not want to schedule today, she has some travel plans upcoming, she will contact me when she is ready to schedule.  If iron deficiency is noted, she agreed to have it done soon.   [place] Cellar, MD [facility]

## 2018-03-22 NOTE — Patient Instructions (Signed)
If you are age 74 or older, your body mass index should be between 23-30. Your Body mass index is 24 kg/m. If this is out of the aforementioned range listed, please consider follow up with your Primary Care Provider.  If you are age 43 or younger, your body mass index should be between 19-25. Your Body mass index is 24 kg/m. If this is out of the aformentioned range listed, please consider follow up with your Primary Care Provider.   Please go to the lab in the basement of our building to have lab work done as you leave today.  It has been recommended to you by your physician that you have a(n) endoscopy completed. Per your request, we did not schedule the procedure(s) today. Please contact our office at [phone] when you decide to have the procedure completed.  Thank you for entrusting me with your care and for choosing [facility], Dr. [place] Cellar

## 2018-03-23 LAB — IRON AND TIBC
Iron Saturation: 16 % (ref 15–55)
Iron: 51 ug/dL (ref 27–139)
Total Iron Binding Capacity: 324 ug/dL (ref 250–450)
UIBC: 273 ug/dL (ref 118–369)

## 2018-04-07 ENCOUNTER — Other Ambulatory Visit: Payer: Self-pay | Admitting: Neurology

## 2018-04-20 ENCOUNTER — Encounter: Payer: Self-pay | Admitting: Family Medicine

## 2018-04-25 ENCOUNTER — Other Ambulatory Visit: Payer: Self-pay | Admitting: Family Medicine

## 2018-04-25 DIAGNOSIS — Z1231 Encounter for screening mammogram for malignant neoplasm of breast: Secondary | ICD-10-CM

## 2018-05-04 ENCOUNTER — Other Ambulatory Visit: Payer: Self-pay | Admitting: Family Medicine

## 2018-05-06 NOTE — Progress Notes (Signed)
Subjective:   Pamela Patton is a 72 y.o. 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 Elyse Hsu A1c 7.4   bMI 23.6   Discusses parkinson  Lives in a town home/ 3 level  Now it is not an issue Is aware of safety and making home safer by removing rugs etc     Hyperlipidemia -labs great    BMI 23 feels good this weight Going out for some meals  Stops at the grocery and eats salads  Made a spinach souffle, baked potato etc Takes a long time for her to cook    Exercise reported x 5 days a week  Now starting a cycling class and is free mon -th x45 min x 2 days a week Loves the cycling class today as it revives her   Stressors: She is adjusting to her disabilites Discusses loss of brother to pancreatic cancer x3 years  Strongest strength is being able to change her attitude No depression voiced  Sleep patterns: sleeps well most of the time Wakes up refreshed    Health Maintenance Due  Topic Date Due  . MAMMOGRAM  10/18/2017  . OPHTHALMOLOGY EXAM  12/19/2017  . INFLUENZA VACCINE  05/03/2018    [facility] 10/2015; - [facility] Mammogram scheduled x 2 weeks   Eye exam 12/19/2016 and one scheduled tomorrow with Dr. Herbert Deaner  Colonoscopy 07/28/2016 and she see Dr. Enis Gash and he stated she could have one 2022   To shedule with Dr. Gwenlyn Found the cardiologist   May have an endoscopy at the end of August   Dexa scan last year in October  Left femur -2.6  Takes calcium and Vit D  May repeat 07/2019    Flu vaccines are not available at this time   Shingrix is a vaccine for the prevention of Shingles in Adults 50 and older.  If you  are on Medicare, the shingrix is covered under your Part D plan, so you will take both of the vaccines in the series at your pharmacy. Please check with your benefits regarding applicable copays or out of pocket expenses.  The Shingrix is given in 2 vaccines approx 8 weeks apart. You must receive the 2nd dose prior to 6 months from receipt of the first. Please have the pharmacist print out you Immunization  dates for our office records    Advanced Directive is on her agenda to completed        Objective:     Vitals: BP 122/60   Pulse 67   Ht 5\' 5"  (1.651 m)   Wt 142 lb 6 oz (64.6 kg)   SpO2 97%   BMI 23.69 kg/m   Body mass index is 23.69 kg/m.  Advanced Directives 11/28/2017 05/25/2017 05/09/2017 05/04/2017 04/18/2017 04/18/2017 04/18/2017  Does Patient Have a Medical Advance Directive? No No No No No No No  Would patient like information on creating a medical advance directive? - - - Yes (MAU/Ambulatory/Procedural Areas - Information given) - No - Patient declined No - Patient declined   Has not  Completed but plans to do so    Tobacco Social  History   Tobacco Use  Smoking Status Never Smoker  Smokeless Tobacco Never Used     Counseling given: Yes   Clinical Intake:   Past Medical History:  Diagnosis Date  . Anemia   . Arthritis   . Cataract    immature   . Chest pain summer 2012  . Chicken pox   . Diverticulosis    difficulty with colonoscopy 2005  . 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 Elyse Hsu 10/08/2012  . Fibroid    fibroid tumor  . Fracture    radial/ulnar 03/2016  . GERD (gastroesophageal reflux disease)   . Heart murmur    innocent murmur  . High cholesterol   . Hypertension   . Hypothyroid, s/p radioactive iodine tx for graves 2001 08/24/2012  . Intestinal metaplasia of gastric mucosa   . Kidney disease   . PD (Parkinson's disease) ([place]) 08/08/2016  . Renal cancer ([place])   . UTI (urinary tract  infection)   . Weight loss    eval with GI in 2016; s/p CT/MRI and PET scans   Past Surgical History:  Procedure Laterality Date  . ABDOMINAL HYSTERECTOMY  1989  . CATARACT EXTRACTION, BILATERAL    . COLONOSCOPY    . DG BARIUM SWALLOW ([place] HX)     9 yrs ago per pt due to incomplete colonsocopy   Family History  Problem Relation Age of Onset  . Cancer Mother        liver  . Liver cancer Mother   . Stroke Father 33  . Diabetes Father   . Hypertension Father   . Diabetes Maternal Grandmother   . Prostate cancer Maternal Grandfather   . Pancreatic cancer Brother   . Diabetes Sister   . Cancer Sister   . Diabetes Sister   . Colon polyps Neg Hx   . Colon cancer Neg Hx   . Esophageal cancer Neg Hx   . Rectal cancer Neg Hx   . Stomach cancer Neg Hx    Social History   Socioeconomic History  . Marital status: Single    Spouse name: Not on file  . Number of children: Not on file  . Years of education: Not on file  . Highest education level: Not on file  Occupational History  . 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 Financial resource strain: Not on file  . Food insecurity:    Worry: Not on file    Inability: Not on file  . Transportation needs:    Medical: Not on file    Non-medical: Not on file  Tobacco Use  . Smoking status: Never Smoker  . Smokeless tobacco: Never Used  Substance and Sexual Activity  . Alcohol use: No    Alcohol/week: 0.0 oz  . Drug use: No  . Sexual activity: Not on file  Lifestyle  . Physical activity:    Days per week: Not on file    Minutes per session: Not on file  . Stress: Not on file  Relationships  . Social connections:    Talks on phone: Not on file    Gets together: Not on file    Attends religious service: Not on file    Active member of club or organization: Not on file    Attends meetings of clubs or organizations: Not on file    Relationship status: Not on file    Other  Topics Concern  . 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 aspirin 81 MG tablet Take 81 mg by mouth daily with lunch.   Marland Kitchen atorvastatin (LIPITOR) 40 MG tablet Take 40 mg by mouth daily with lunch.   . Calcium Carbonate-Vitamin D (CALTRATE 600+D) 600-400 MG-UNIT per tablet Take 1 tablet by mouth 3 (three) times a week.   . carbidopa-levodopa (SINEMET IR) 25-100 MG tablet TAKE 1.5 TABLETS BY MOUTH 3 (THREE) TIMES DAILY.  Marland Kitchen Cholecalciferol (VITAMIN D PO) Take 5,000 Units by mouth 3 (three) times a week.   . Cyanocobalamin (VITAMIN B12 PO) Take 1 tablet by mouth daily.  Marland Kitchen glucose 4 GM chewable tablet Chew 1 tablet by mouth as needed for low blood sugar.  Marland Kitchen HUMALOG KWIKPEN 100 UNIT/ML KiwkPen Inject 0-4 Units into the skin 3 (three) times daily with meals.   . hydrochlorothiazide (HYDRODIURIL) 25 MG tablet Take 25 mg by mouth daily with lunch.   . insulin glargine (LANTUS) 100 UNIT/ML injection Inject 15 Units into the skin every evening.   Marland Kitchen levothyroxine (SYNTHROID, LEVOTHROID) 75 MCG tablet Take 75 mcg by mouth daily.  Marland Kitchen losartan (COZAAR) 50 MG tablet TAKE 1 TABLET BY MOUTH DAILY  . METFORMIN HCL PO Take 500 mg by mouth 2 (two) times daily.   . Multiple Vitamins-Minerals (PRESERVISION AREDS PO) Take 1 tablet by mouth daily as needed (FOR EYES).   . Nutritional Supplements (FEEDING SUPPLEMENT, GLUCERNA 1.2 CAL,) LIQD Place 237 mLs into feeding tube daily with breakfast.  . Pioglitazone HCl (ACTOS PO) Take 45 mg by mouth daily with lunch.   Vladimir Faster Glycol-Propyl Glycol  (SYSTANE) 0.4-0.3 % GEL ophthalmic gel Place 1 application into both eyes at bedtime as needed (FOR EYES).  . [DISCONTINUED] losartan (COZAAR) 50 MG tablet TAKE 1 TABLET BY MOUTH DAILY   Facility-Administered Encounter Medications as of 05/08/2018  Medication  . 0.9 %  sodium chloride infusion    Activities of Daily Living No flowsheet data found.  Patient Care Team: Lucretia Kern, DO as PCP - General (Family Medicine) Lorretta Harp, MD as PCP - Cardiology (Cardiology) Altheimer, Legrand Como, MD as Attending Physician (Endocrinology) Ardis Hughs, MD as Attending Physician (Urology) Armbruster, Carlota Raspberry, MD as Consulting Physician (Gastroenterology)    Assessment:   This is a routine wellness examination for Clemma.  Exercise Activities and Dietary recommendations    Goals    . Patient Stated     Modify diet and buys thinks partially cooked. Casserole's are easy  Buy a few items so it is not overwhelming Air tight containers; cut vegetables so they are ready  Plan your trips         Fall Risk Fall Risk  05/08/2018 09/18/2017 04/27/2017 02/08/2017 10/11/2016  Falls in the past year? No No No Yes Yes  Number falls in past yr: - - - 1 1  Injury with Fall? - - - No Yes  Follow up - - - Falls  evaluation completed Falls evaluation completed    Depression Screen PHQ 2/9 Scores 05/08/2018 05/04/2017 04/27/2017 03/22/2016  PHQ - 2 Score 0 0 0 0     Cognitive Function Ad8 score reviewed for issues:  Issues making decisions:  Less interest in hobbies / activities:  Repeats questions, stories (family complaining):  Trouble using ordinary gadgets (microwave, computer, phone):  Forgets the month or year:   Mismanaging finances:   Remembering appts:  Daily problems with thinking and/or memory: Ad8 score is=0 Dr. Carles Collet is following        6CIT Screen 05/04/2017  What Year? 0 points  What month? 0 points  What time? 0 points  Count back from 20 0 points  Months in  reverse 0 points  Repeat phrase 0 points  Total Score 0    Immunization History  Administered Date(s) Administered  . Influenza Whole 07/03/2012  . Influenza-Unspecified 07/04/2015, 07/04/2016, 07/03/2017  . Pneumococcal Conjugate-13 06/17/2014  . Pneumococcal Polysaccharide-23 10/03/2009, 02/01/2011  . Tdap 03/03/2009, 03/12/2016  . Zoster 01/31/2010     Screening Tests Health Maintenance  Topic Date Due  . MAMMOGRAM  10/18/2017  . OPHTHALMOLOGY EXAM  12/19/2017  . INFLUENZA VACCINE  05/03/2018  . HEMOGLOBIN A1C  08/11/2018  . FOOT EXAM  09/04/2018  . COLONOSCOPY  07/28/2021  . TETANUS/TDAP  03/12/2026  . DEXA SCAN  Completed  . PNA vac Low Risk Adult  Completed  . Hepatitis C Screening  Addressed        Plan:      PCP Notes  Mammogram 10/2015; - the breast center Mammogram scheduled x 2 weeks   Eye exam 12/19/2016 and one scheduled tomorrow with Dr. Herbert Deaner  Colonoscopy 07/28/2016 and she see Dr. Enis Gash and he stated she could have one 2022   To shedule with Dr. Gwenlyn Found the cardiologist   May have an endoscopy at the end of August   Dexa scan last year in October  Left femur -2.6  Takes calcium and Vit D  May repeat 07/2019    Flu vaccines are not available at this time   Shingrix is a vaccine for the prevention of Shingles in Adults 50 and older.  If you are on Medicare, the shingrix is covered under your Part D plan, so you will take both of the vaccines in the series at your pharmacy. Please check with your benefits regarding applicable copays or out of pocket expenses.  The Shingrix is given in 2 vaccines approx 8 weeks apart. You must receive the 2nd dose prior to 6 months from receipt of the first. Please have the pharmacist print out you Immunization  dates for our office records      Abnormal Screens  none  Referrals  none  Patient concerns; none  Nurse Concerns; She is updating and modifying her lifestyle  in lieu   Next PCP apt  05/15/2018        I have personally reviewed and noted the following in the patient's chart:   . Medical and social history . Use of alcohol, tobacco or illicit drugs  . Current medications and supplements . Functional ability and status . Nutritional status . Physical activity . Advanced directives . List of other physicians . Hospitalizations, surgeries, and ER visits in previous 12 months . Vitals . Screenings to include cognitive, depression, and falls . Referrals and appointments  In addition, I have reviewed and discussed with patient certain preventive protocols, quality metrics, and best practice recommendations. A written personalized  care plan for preventive services as well as general preventive health recommendations were provided to patient.     Wynetta Fines, RN  05/08/2018

## 2018-05-08 ENCOUNTER — Ambulatory Visit (INDEPENDENT_AMBULATORY_CARE_PROVIDER_SITE_OTHER): Payer: Medicare Other

## 2018-05-08 ENCOUNTER — Ambulatory Visit: Payer: Medicare Other | Admitting: Family Medicine

## 2018-05-08 ENCOUNTER — Encounter: Payer: Self-pay | Admitting: Family Medicine

## 2018-05-08 VITALS — BP 122/60 | HR 67 | Ht 65.0 in | Wt 142.4 lb

## 2018-05-08 VITALS — BP 122/60 | HR 67 | Temp 98.0°F | Wt 142.4 lb

## 2018-05-08 DIAGNOSIS — N183 Chronic kidney disease, stage 3 unspecified: Secondary | ICD-10-CM

## 2018-05-08 DIAGNOSIS — E039 Hypothyroidism, unspecified: Secondary | ICD-10-CM

## 2018-05-08 DIAGNOSIS — E1129 Type 2 diabetes mellitus with other diabetic kidney complication: Secondary | ICD-10-CM

## 2018-05-08 DIAGNOSIS — Z794 Long term (current) use of insulin: Secondary | ICD-10-CM

## 2018-05-08 DIAGNOSIS — E1159 Type 2 diabetes mellitus with other circulatory complications: Secondary | ICD-10-CM

## 2018-05-08 DIAGNOSIS — I10 Essential (primary) hypertension: Secondary | ICD-10-CM

## 2018-05-08 DIAGNOSIS — G20 Parkinson's disease: Secondary | ICD-10-CM

## 2018-05-08 DIAGNOSIS — E1169 Type 2 diabetes mellitus with other specified complication: Secondary | ICD-10-CM

## 2018-05-08 DIAGNOSIS — Z0000 Encounter for general adult medical examination without abnormal findings: Secondary | ICD-10-CM | POA: Diagnosis not present

## 2018-05-08 DIAGNOSIS — D3000 Benign neoplasm of unspecified kidney: Secondary | ICD-10-CM

## 2018-05-08 DIAGNOSIS — D649 Anemia, unspecified: Secondary | ICD-10-CM | POA: Diagnosis not present

## 2018-05-08 DIAGNOSIS — E785 Hyperlipidemia, unspecified: Secondary | ICD-10-CM

## 2018-05-08 LAB — CBC WITH DIFFERENTIAL/PLATELET
BASOS PCT: 0.8 % (ref 0.0–3.0)
Basophils Absolute: 0 10*3/uL (ref 0.0–0.1)
EOS ABS: 0 10*3/uL (ref 0.0–0.7)
EOS PCT: 0.7 % (ref 0.0–5.0)
HCT: 33.9 % — ABNORMAL LOW (ref 36.0–46.0)
HEMOGLOBIN: 11.1 g/dL — AB (ref 12.0–15.0)
LYMPHS ABS: 1.4 10*3/uL (ref 0.7–4.0)
Lymphocytes Relative: 23.4 % (ref 12.0–46.0)
MCHC: 32.8 g/dL (ref 30.0–36.0)
MCV: 90.9 fl (ref 78.0–100.0)
MONO ABS: 0.4 10*3/uL (ref 0.1–1.0)
Monocytes Relative: 6.9 % (ref 3.0–12.0)
NEUTROS ABS: 4.1 10*3/uL (ref 1.4–7.7)
NEUTROS PCT: 68.2 % (ref 43.0–77.0)
PLATELETS: 240 10*3/uL (ref 150.0–400.0)
RBC: 3.72 Mil/uL — ABNORMAL LOW (ref 3.87–5.11)
RDW: 15.2 % (ref 11.5–15.5)
WBC: 6.1 10*3/uL (ref 4.0–10.5)

## 2018-05-08 NOTE — Progress Notes (Signed)
Hannah R Kim, DO  

## 2018-05-08 NOTE — Patient Instructions (Addendum)
Pamela Patton , Thank you for taking time to come for your Medicare Wellness Visit. I appreciate your ongoing commitment to your health goals. Please review the following plan we discussed and let me know if I can assist you in the future.   Enjoying your family affair in [place]   Ask Dr. Hall Busing about constipation   Has scheduled mammogram in/ 2 weeks at the breast center  Eye exam Dr. Herbert Deaner tomorrow (cataracts last year)    Shingrix is a vaccine for the prevention of Shingles in Adults 46 and older.  If you are on Medicare, the shingrix is covered under your Part D plan, so you will take both of the vaccines in the series at your pharmacy. Please check with your benefits regarding applicable copays or out of pocket expenses.  The Shingrix is given in 2 vaccines approx 8 weeks apart. You must receive the 2nd dose prior to 6 months from receipt of the first. Please have the pharmacist print out you Immunization  dates for our office records   Will schedule with Dr. Gwenlyn Found the cardiologist   Bone density in October 2018 and continue to take Vit d Has some osteoporosis   You can a hearing test anywhere; medicare pays for this [facility] - can assist with hearing aid x 1  No reviews  [facility]  [address]  [phone]  http://[email or web address]-[email or web address]/files/hearingpedia/Guide_How_to_Buy_Hearing_Aids.pdf    These are the goals we discussed: Goals    . Patient Stated     Modify diet and buys thinks partially cooked. Casserole's are easy  Buy a few items so it is not overwhelming Air tight containers; cut vegetables so they are ready  Plan your trips         This is a list of the screening recommended for you and due dates:  Health Maintenance  Topic Date Due  . Mammogram  10/18/2017  . Eye exam for diabetics  12/19/2017  . Flu Shot  05/03/2018  . Hemoglobin A1C  08/11/2018  . Complete foot exam    09/04/2018  . Colon Cancer Screening  07/28/2021  . Tetanus Vaccine  03/12/2026  . DEXA scan (bone density measurement)  Completed  . Pneumonia vaccines  Completed  .  Hepatitis C: One time screening is recommended by Center for Disease Control  (CDC) for  adults born from [birth year] through 1967.   Addressed    '  Fall Prevention in the [place] can cause injuries. They can happen to people of all ages. There are many things you can do to make your home safe and to help prevent falls. What can I do on the outside of my home?  Regularly fix the edges of walkways and driveways and fix any cracks.  Remove anything that might make you trip as you walk through a door, such as a raised step or threshold.  Trim any bushes or trees on the path to your home.  Use bright outdoor lighting.  Clear any walking paths of anything that might make someone trip, such as rocks or tools.  Regularly check to see if handrails are loose or broken. Make sure that both sides of any steps have handrails.  Any raised decks and porches should have guardrails on the edges.  Have any leaves, snow, or ice cleared regularly.  Use sand or salt on walking paths during winter.  Clean up any spills in your garage right away. This  includes oil or grease spills. What can I do in the bathroom?  Use night lights.  Install grab bars by the toilet and in the tub and shower. Do not use towel bars as grab bars.  Use non-skid mats or decals in the tub or shower.  If you need to sit down in the shower, use a plastic, non-slip stool.  Keep the floor dry. Clean up any water that spills on the floor as soon as it happens.  Remove soap buildup in the tub or shower regularly.  Attach bath mats securely with double-sided non-slip rug tape.  Do not have throw rugs and other things on the floor that can make you trip. What can I do in the bedroom?  Use night lights.  Make sure that you have a light by your bed that  is easy to reach.  Do not use any sheets or blankets that are too big for your bed. They should not hang down onto the floor.  Have a firm chair that has side arms. You can use this for support while you get dressed.  Do not have throw rugs and other things on the floor that can make you trip. What can I do in the kitchen?  Clean up any spills right away.  Avoid walking on wet floors.  Keep items that you use a lot in easy-to-reach places.  If you need to reach something above you, use a strong step stool that has a grab bar.  Keep electrical cords out of the way.  Do not use floor polish or wax that makes floors slippery. If you must use wax, use non-skid floor wax.  Do not have throw rugs and other things on the floor that can make you trip. What can I do with my stairs?  Do not leave any items on the stairs.  Make sure that there are handrails on both sides of the stairs and use them. Fix handrails that are broken or loose. Make sure that handrails are as long as the stairways.  Check any carpeting to make sure that it is firmly attached to the stairs. Fix any carpet that is loose or worn.  Avoid having throw rugs at the top or bottom of the stairs. If you do have throw rugs, attach them to the floor with carpet tape.  Make sure that you have a light switch at the top of the stairs and the bottom of the stairs. If you do not have them, ask someone to add them for you. What else can I do to help prevent falls?  Wear shoes that: ? Do not have high heels. ? Have rubber bottoms. ? Are comfortable and fit you well. ? Are closed at the toe. Do not wear sandals.  If you use a stepladder: ? Make sure that it is fully opened. Do not climb a closed stepladder. ? Make sure that both sides of the stepladder are locked into place. ? Ask someone to hold it for you, if possible.  Clearly mark and make sure that you can see: ? Any grab bars or handrails. ? First and last  steps. ? Where the edge of each step is.  Use tools that help you move around (mobility aids) if they are needed. These include: ? Canes. ? Walkers. ? Scooters. ? Crutches.  Turn on the lights when you go into a dark area. Replace any light bulbs as soon as they burn out.  Set up your furniture so  you have a clear path. Avoid moving your furniture around.  If any of your floors are uneven, fix them.  If there are any pets around you, be aware of where they are.  Review your medicines with your doctor. Some medicines can make you feel dizzy. This can increase your chance of falling. Ask your doctor what other things that you can do to help prevent falls. This information is not intended to replace advice given to you by your health care provider. Make sure you discuss any questions you have with your health care provider. Document Released: 07/16/2009 Document Revised: 02/25/2016 Document Reviewed: 10/24/2014 Elsevier Interactive Patient Education  2018 [employer].   Hearing Loss Hearing loss is a partial or total loss of the ability to hear. This can be temporary or permanent, and it can happen in one or both ears. Hearing loss may be referred to as deafness. Medical care is necessary to treat hearing loss properly and to prevent the condition from getting worse. Your hearing may partially or completely come back, depending on what caused your hearing loss and how severe it is. In some cases, hearing loss is permanent. What are the causes? Common causes of hearing loss include:  Too much wax in the ear canal.  Infection of the ear canal or middle ear.  Fluid in the middle ear.  Injury to the ear or surrounding area.  An object stuck in the ear.  Prolonged exposure to loud sounds, such as music.  Less common causes of hearing loss include:  Tumors in the ear.  Viral or bacterial infections, such as meningitis.  A hole in the eardrum (perforated eardrum).  Problems  with the hearing nerve that sends signals between the brain and the ear.  Certain medicines.  What are the signs or symptoms? Symptoms of this condition may include:  Difficulty telling the difference between sounds.  Difficulty following a conversation when there is background noise.  Lack of response to sounds in your environment. This may be most noticeable when you do not respond to startling sounds.  Needing to turn up the volume on the television, radio, etc.  Ringing in the ears.  Dizziness.  Pain in the ears.  How is this diagnosed? This condition is diagnosed based on a physical exam and a hearing test (audiometry). The audiometry test will be performed by a hearing specialist (audiologist). You may also be referred to an ear, nose, and throat (ENT) specialist (otolaryngologist). How is this treated? Treatment for recent onset of hearing loss may include:  Ear wax removal.  Being prescribed medicines to prevent infection (antibiotics).  Being prescribed medicines to reduce inflammation (corticosteroids).  Follow these instructions at home:  If you were prescribed an antibiotic medicine, take it as told by your health care provider. Do not stop taking the antibiotic even if you start to feel better.  Take over-the-counter and prescription medicines only as told by your health care provider.  Avoid loud noises.  Return to your normal activities as told by your health care provider. Ask your health care provider what activities are safe for you.  Keep all follow-up visits as told by your health care provider. This is important. Contact a health care provider if:  You feel dizzy.  You develop new symptoms.  You vomit or feel nauseous.  You have a fever. Get help right away if:  You develop sudden changes in your vision.  You have severe ear pain.  You have new or  increased weakness.  You have a severe headache. This information is not intended to  replace advice given to you by your health care provider. Make sure you discuss any questions you have with your health care provider. Document Released: 09/19/2005 Document Revised: 02/25/2016 Document Reviewed: 02/04/2015 Elsevier Interactive Patient Education  2018 [employer].

## 2018-05-08 NOTE — Progress Notes (Signed)
HPI:  Using dictation device. Unfortunately this device frequently misinterprets words/phrases.  Pamela Patton is a pleasant 72 y.o. here for follow up. Chronic medical problems summarized below were reviewed for changes and stability and were updated as needed below. These issues and their treatment remain stable for the most part.  Reports she is doing well.  Reports she continues to cycle on a regular basis and that this is helpful her energy, and her mood.  See HPI she has her diabetes and metabolic labs with her endocrinologist coming up soon later this week.  She reports she will see her eye doctor tomorrow for her diabetic eye exam.  She reports that she is scheduled for her mammogram.  She saw [name] today for her annual wellness visit.   She saw the gastroenterologist about her anemia.  She had a number of labs that were all normal including iron studies and B12.  For now she is holding off on the EGD they suggested.  They did not feel that her anemia is GI related.  She is interested in rechecking the status of her anemia.  May consider hematology referral.  She has not seen Dr. Louis Meckel about her kidneys, but agrees to schedule this.  She has been anxious about how this conversation may go.  She is not sure she would want surgery on her kidneys if the mass is changed.  Denies CP, SOB, DOE, treatment intolerance or new symptoms.   HTN: -meds: Aspirin, losartan 50, hctz 25 -had negative cardiology eval, EKG and trop for CP in 11/2017 ER visit -sees cardiologist for HTN and ? Carotid art dz - but neg carotid duplex in 2018 per cardiology notes "Essentially normal study. Repeat when clinically indicated." Dr. Gwenlyn Found  Diabetes w/ CKD: -meds: humalog, lantus, metformin, pioglitazone, asa, arb -sees Dr. Elyse Hsu for management  Hypothyroidism: -meds: levothyroxine -sees Dr. Elyse Hsu for management  HLD: -meds: atorvastatin  Parkinson Dz: -sees Dr. Carles Collet for management -meds:  sinemet  Hx of Anemia: -neg eval iron studies, b12 and folate with GI in 6/19 and they advised EGD, but doubted GI source -advised seeing Dr. Louis Meckel as well whom follows her for her renal mass and CKD    ROS: See pertinent positives and negatives per HPI.  Past Medical History:  Diagnosis Date  . Anemia   . Arthritis   . Cataract    immature   . Chest pain summer 2012  . Chicken pox   . Diverticulosis    difficulty with colonoscopy 2005  . 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 Elyse Hsu 10/08/2012  . Fibroid    fibroid tumor  . Fracture    radial/ulnar 03/2016  . GERD (gastroesophageal reflux disease)   . Heart murmur    innocent murmur  . High cholesterol   . Hypertension   . Hypothyroid, s/p radioactive iodine tx for graves 2001 08/24/2012  . Intestinal metaplasia of gastric mucosa   . Kidney disease   . PD (Parkinson's disease) ([place]) 08/08/2016  . Renal cancer ([place])   . UTI (urinary tract infection)   . Weight loss    eval with GI in 2016; s/p CT/MRI and PET scans    Past Surgical History:  Procedure Laterality Date  . ABDOMINAL HYSTERECTOMY  1989  . CATARACT EXTRACTION, BILATERAL    . COLONOSCOPY    . DG BARIUM SWALLOW ([facility] HX)     9 yrs ago per pt due to incomplete colonsocopy  Family History  Problem Relation Age of Onset  . Cancer Mother        liver  . Liver cancer Mother   . Stroke Father 104  . Diabetes Father   . Hypertension Father   . Diabetes Maternal Grandmother   . Prostate cancer Maternal Grandfather   . Pancreatic cancer Brother   . Diabetes Sister   . Cancer Sister   . Diabetes Sister   . Colon polyps Neg Hx   . Colon cancer Neg Hx   . Esophageal cancer Neg Hx   . Rectal cancer Neg Hx   . Stomach cancer Neg Hx     SOCIAL HX: see hpi   Current Outpatient Medications:  .  aspirin 81 MG tablet, Take 81 mg by mouth daily with lunch. , Disp: , Rfl:  .  atorvastatin (LIPITOR) 40  MG tablet, Take 40 mg by mouth daily with lunch. , Disp: , Rfl:  .  Calcium Carbonate-Vitamin D (CALTRATE 600+D) 600-400 MG-UNIT per tablet, Take 1 tablet by mouth 3 (three) times a week. , Disp: , Rfl:  .  carbidopa-levodopa (SINEMET IR) 25-100 MG tablet, TAKE 1.5 TABLETS BY MOUTH 3 (THREE) TIMES DAILY., Disp: 405 tablet, Rfl: 0 .  Cholecalciferol (VITAMIN D PO), Take 5,000 Units by mouth 3 (three) times a week. , Disp: , Rfl:  .  Cyanocobalamin (VITAMIN B12 PO), Take 1 tablet by mouth daily., Disp: , Rfl:  .  glucose 4 GM chewable tablet, Chew 1 tablet by mouth as needed for low blood sugar., Disp: , Rfl:  .  HUMALOG KWIKPEN 100 UNIT/ML KiwkPen, Inject 0-4 Units into the skin 3 (three) times daily with meals. , Disp: , Rfl:  .  hydrochlorothiazide (HYDRODIURIL) 25 MG tablet, Take 25 mg by mouth daily with lunch. , Disp: , Rfl:  .  insulin glargine (LANTUS) 100 UNIT/ML injection, Inject 15 Units into the skin every evening. , Disp: , Rfl:  .  levothyroxine (SYNTHROID, LEVOTHROID) 75 MCG tablet, Take 75 mcg by mouth daily., Disp: , Rfl:  .  losartan (COZAAR) 50 MG tablet, TAKE 1 TABLET BY MOUTH DAILY, Disp: 90 tablet, Rfl: 1 .  METFORMIN HCL PO, Take 500 mg by mouth 2 (two) times daily. , Disp: , Rfl:  .  Multiple Vitamins-Minerals (PRESERVISION AREDS PO), Take 1 tablet by mouth daily as needed (FOR EYES). , Disp: , Rfl:  .  Nutritional Supplements (FEEDING SUPPLEMENT, GLUCERNA 1.2 CAL,) LIQD, Place 237 mLs into feeding tube daily with breakfast., Disp: , Rfl:  .  Pioglitazone HCl (ACTOS PO), Take 45 mg by mouth daily with lunch. , Disp: , Rfl:  .  Polyethyl Glycol-Propyl Glycol (SYSTANE) 0.4-0.3 % GEL ophthalmic gel, Place 1 application into both eyes at bedtime as needed (FOR EYES)., Disp: , Rfl:   Current Facility-Administered Medications:  .  0.9 %  sodium chloride infusion, 500 mL, Intravenous, Continuous, Armbruster, Carlota Raspberry, MD  EXAM:  Vitals:   05/08/18 1019  BP: 122/60  Pulse: 67     Body mass index is 23.69 kg/m.  GENERAL: vitals reviewed and listed above, alert, oriented, appears well hydrated and in no acute distress  HEENT: atraumatic, conjunttiva clear, no obvious abnormalities on inspection of external nose and ears  NECK: no obvious masses on inspection  LUNGS: clear to auscultation bilaterally, no wheezes, rales or rhonchi, good air movement  CV: HRRR, no peripheral edema  MS: moves all extremities without noticeable abnormality  PSYCH: pleasant and cooperative, no obvious  depression or anxiety  ASSESSMENT AND PLAN:  Discussed the following assessment and plan:  Anemia, unspecified type  Hypothyroidism, unspecified type  Type 2 diabetes mellitus with other diabetic kidney complication, with long-term current use of insulin (HCC)  CKD (chronic kidney disease) stage 3, GFR 30-59 ml/min (HCC)  Hypertension associated with diabetes ([place])  Hyperlipidemia associated with type 2 diabetes mellitus (HCC)  PD (Parkinson's disease) ([place])  Benign neoplasm of kidney, unspecified laterality  -CBC to check status of anemia, she is not sure if she wishes to pursue further evaluation if it is stable or improving, if worsening she agrees to see hematology -She agrees to schedule follow-up with Dr. Louis Meckel regarding her kidney she agrees to call to schedule this,  -Sees endocrinology for management of the diabetes, hyperlipidemia and thyroid and reports she has labs later this week -Sees neurology for management of Parkinson's disease, is doing well with cycling -Follow-up in 3 to 4 months, or as needed -Recommended that she have her ophthalmologist fax the diabetic eye report to [place] -Patient advised to return or notify a doctor immediately if symptoms worsen or persist or new concerns arise.  Patient Instructions  BEFORE YOU LEAVE: -Lab -follow up: 3 to 4 months  We have ordered labs or studies at this visit. It can take up to 1-2 weeks for results  and processing. IF results require follow up or explanation, we will call you with instructions. Clinically stable results will be released to your [facility]. If you have not heard from [place] or cannot find your results in [facility] in 2 weeks please contact our office at [phone].  If you are not yet signed up for [facility], please consider signing up.  Get your diabetic eye exam as planned tomorrow.  Please instruct your ophthalmologist to send the report to [place] tomorrow.  Call to schedule follow-up with Dr. Louis Meckel regarding your kidney.          Lucretia Kern, DO

## 2018-05-08 NOTE — Patient Instructions (Signed)
BEFORE YOU LEAVE: -Lab -follow up: 3 to 4 months  We have ordered labs or studies at this visit. It can take up to 1-2 weeks for results and processing. IF results require follow up or explanation, we will call you with instructions. Clinically stable results will be released to your [facility]. If you have not heard from [place] or cannot find your results in [facility] in 2 weeks please contact our office at [phone].  If you are not yet signed up for [facility], please consider signing up.  Get your diabetic eye exam as planned tomorrow.  Please instruct your ophthalmologist to send the report to [place] tomorrow.  Call to schedule follow-up with Dr. Louis Meckel regarding your kidney.

## 2018-05-10 NOTE — Addendum Note (Signed)
Addended by: Agnes Lawrence on: 05/10/2018 10:55 AM   Modules accepted: Orders

## 2018-05-15 ENCOUNTER — Ambulatory Visit: Payer: Medicare Other | Admitting: Family Medicine

## 2018-05-22 LAB — LIPID PANEL
Cholesterol: 155 (ref 0–200)
HDL: 69 (ref 35–70)
LDL CALC: 64
Triglycerides: 46 (ref 40–160)

## 2018-05-22 LAB — HEMOGLOBIN A1C: Hemoglobin A1C: 7.3

## 2018-05-22 NOTE — Progress Notes (Signed)
Pamela Patton was seen today in the movement disorders clinic for neurologic consultation at the request of Lucretia Kern, DO.  The consultation is for the evaluation of gait change, cogwheel rigidity and to r/o PD.  The records that were made available to me were reviewed.  Pt reports that she has had unsteadiness at least since June.  She fell then and broke her radius/ulna.  States that she turned around at the sink and thinks that she tripped over her feet and "raggady bottom shoes"  10/11/16 update:  The patient follows up today.  I started her on carbidopa/levodopa 25/100 and she has worked up to one tablet 3 times daily.  "I think that it is helping."  When first started the whole tablets she had mild nausea for 5 minutes only but that is better.  Pt denies falls.  Pt denies lightheadedness, near syncope.  No hallucinations.  Mood has been "pretty good." The patient has been attending therapy at the outpatient rehabilitation center for physical, occupational and speech therapy. She will finish up OT/PT this week and has ST until second week of February.   I have reviewed notes.   A MBE was requested by speech therapy and this is scheduled for 10/13/2016.  No other CV exercise besides for therapy.    02/08/17 update:  Patient seen in follow-up today.  States that "I feel perfectly fine this AM but this past weekend I had abdominal and back pain."  Was better if relaxed and stretched.  She is unsure if it is related to timing of levodopa.   Admits that she was raking leaves a few days before when it started.   She is on carbidopa/levodopa 25/100, one tablet 3 times per day (7-9am/2-3pm/7pm).  She had a modified barium swallow on 10/13/2016 that demonstrated mild pharyngeal phase dysphagia.  Regular diet with thin liquids was recommended.  She had one fall in her sleep and fell off of the bed.  05/19/17 update:  Pt f/u today for Parkinsonism.  The records that were made available to me were reviewed since  last visit.  On carbidopa/levodopa 25/100 tid (8-8:30/12-12:30/6:00).  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  About 2 weeks ago, she did have a very vivid dream and she states that in general "I have been sleeping really nicely."   The records that were made available to me were reviewed, including therapy notes.  States was taken off of norvasc as BP's were going too low.  Wearing off:  No.  How long before next dose:  n/a Falls:   No. N/V:  No. Hallucinations:  No.  visual distortions: No. Lightheaded:  No.  Syncope: No. Dyskinesia:  No.   09/18/17 update: Patient seen today in follow-up for Parkinson's.  I have reviewed her records available to me since last visit.  I increased her levodopa last visit to carbidopa/levodopa 25/100, 1.5 tablets 3 times per day.  She states that she only ended up increasing the AM one as she thought that it was for tremor and it did help going up.  She did go to outpatient rehab since our last visit.  Those records are reviewed.  She was exercising with Parkinson's cycle class but she had cataract surgery and says she has been a little "off" with that.  She is back to going now and "it makes me feel good."  She has had no falls.  No lightheadedness or near syncope.  No hallucinations.  She denies any swallowing issues.  She has been seeing endocrinology for her diabetes.  05/24/18 update: Patient is seen today in follow-up for Parkinson's disease.  I have not seen her since December (no showed a visit in May).  Levodopa was increased so that she is now on carbidopa/levodopa 25/100, 1-1/2 tablets 3 times per day.  "That was good."  No significant changes since our last visit.  Still doing [employer] cycle class at [place]; "that makes me feel good."  No hallucinations.   Records are reviewed since her last visit.  She is in the emergency room in February with chest pain which was felt noncardiac in  nature.  Was last seen at primary care on May 08, 2018.  No changes were made.  She denies diplopia today.  She denies swallowing difficulties, and actually states that her voice is much better.  ALLERGIES:  No Known Allergies  CURRENT MEDICATIONS:  Outpatient Encounter Medications as of 05/24/2018  Medication Sig  . aspirin 81 MG tablet Take 81 mg by mouth daily with lunch.   Marland Kitchen atorvastatin (LIPITOR) 40 MG tablet Take 40 mg by mouth daily with lunch.   . Calcium Carbonate-Vitamin D (CALTRATE 600+D) 600-400 MG-UNIT per tablet Take 1 tablet by mouth 3 (three) times a week.   . carbidopa-levodopa (SINEMET IR) 25-100 MG tablet TAKE 1.5 TABLETS BY MOUTH 3 (THREE) TIMES DAILY.  Marland Kitchen Cholecalciferol (VITAMIN D PO) Take 5,000 Units by mouth 3 (three) times a week.   . Cyanocobalamin (VITAMIN B12 PO) Take 1 tablet by mouth daily.  Marland Kitchen glucose 4 GM chewable tablet Chew 1 tablet by mouth as needed for low blood sugar.  Marland Kitchen HUMALOG KWIKPEN 100 UNIT/ML KiwkPen Inject 0-4 Units into the skin 3 (three) times daily with meals.   . hydrochlorothiazide (HYDRODIURIL) 25 MG tablet Take 25 mg by mouth daily with lunch.   . insulin glargine (LANTUS) 100 UNIT/ML injection Inject 15 Units into the skin every evening.   Marland Kitchen levothyroxine (SYNTHROID, LEVOTHROID) 75 MCG tablet Take 75 mcg by mouth daily.  Marland Kitchen losartan (COZAAR) 50 MG tablet TAKE 1 TABLET BY MOUTH DAILY  . METFORMIN HCL PO Take 500 mg by mouth 2 (two) times daily.   . Nutritional Supplements (FEEDING SUPPLEMENT, GLUCERNA 1.2 CAL,) LIQD Place 237 mLs into feeding tube daily with breakfast.  . Pioglitazone HCl (ACTOS PO) Take 45 mg by mouth daily with lunch.   Vladimir Faster Glycol-Propyl Glycol (SYSTANE) 0.4-0.3 % GEL ophthalmic gel Place 1 application into both eyes at bedtime as needed (FOR EYES).  . [DISCONTINUED] Multiple Vitamins-Minerals (PRESERVISION AREDS PO) Take 1 tablet by mouth daily as needed (FOR EYES).    Facility-Administered Encounter Medications as  of 05/24/2018  Medication  . 0.9 %  sodium chloride infusion    PAST MEDICAL HISTORY:   Past Medical History:  Diagnosis Date  . Anemia   . Arthritis   . Cataract    immature   . Chest pain summer 2012  . Chicken pox   . Diverticulosis    difficulty with colonoscopy 2005  . 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 Elyse Hsu 10/08/2012  . Fibroid    fibroid tumor  . Fracture    radial/ulnar 03/2016  . GERD (gastroesophageal reflux disease)   . Heart murmur    innocent murmur  . High cholesterol   . Hypertension   .  Hypothyroid, s/p radioactive iodine tx for graves 2001 08/24/2012  . Intestinal metaplasia of gastric mucosa   . Kidney disease   . PD (Parkinson's disease) ([place]) 08/08/2016  . Renal cancer ([place])   . UTI (urinary tract infection)   . Weight loss    eval with GI in 2016; s/p CT/MRI and PET scans    PAST SURGICAL HISTORY:   Past Surgical History:  Procedure Laterality Date  . ABDOMINAL HYSTERECTOMY  1989  . CATARACT EXTRACTION, BILATERAL    . COLONOSCOPY    . DG BARIUM SWALLOW ([facility] HX)     9 yrs ago per pt due to incomplete colonsocopy    SOCIAL HISTORY:   Social History   Socioeconomic History  . Marital status: Single    Spouse name: Not on file  . Number of children: Not on file  . Years of education: Not on file  . Highest education level: Not on file  Occupational History  . 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 Financial resource strain: Not on file  . Food insecurity:    Worry: Not on file    Inability: Not on file  . Transportation needs:    Medical: Not on file    Non-medical: Not on file  Tobacco Use  . Smoking status: Never Smoker  . Smokeless tobacco: Never Used  Substance and Sexual Activity  . Alcohol use: No    Alcohol/week: 0.0 standard drinks  . Drug use: No  . Sexual activity: Not on file  Lifestyle  . Physical  activity:    Days per week: Not on file    Minutes per session: Not on file  . Stress: Not on file  Relationships  . Social connections:    Talks on phone: Not on file    Gets together: Not on file    Attends religious service: Not on file    Active member of club or organization: Not on file    Attends meetings of clubs or organizations: Not on file    Relationship status: Not on file  . Intimate partner violence:    Fear of current or ex partner: Not on file    Emotionally abused: Not on file    Physically abused: Not on file    Forced sexual activity: Not on file  Other Topics Concern  . 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 Mother  Deceased  . Father  Deceased  . MGM  Deceased  . MGF  Deceased  . PGM  Deceased  . PGF  Deceased  . Brother  Deceased  . Sister  Alive  . Sister  Deceased  . Sister  Alive  . Neg Hx  (Not Specified)    ROS:  A complete 10 system review of systems was obtained and was unremarkable apart from what is mentioned above.  PHYSICAL EXAMINATION:    VITALS:  Vitals:   05/24/18 1525  BP: 128/60  Pulse: 78  SpO2: 94%  Weight: 140 lb (63.5 kg)  Height: 5\' 5"  (1.651 m)   Wt Readings from Last 3 Encounters:  05/24/18 140 lb (63.5 kg)  05/08/18 142 lb 6 oz (64.6 kg)  05/08/18 142 lb 6 oz (64.6 kg)    GEN:  The patient appears stated age and is in NAD. HEENT:  Normocephalic, atraumatic.  The mucous membranes are moist. The superficial temporal arteries are without ropiness or tenderness. CV:  RRR Lungs:  CTAB Neck/HEME:  There are no  carotid bruits bilaterally.  Neurological examination:  Orientation: The patient is alert and oriented x3. Cranial nerves: There is good facial symmetry. The speech is fluent and clear.  Extraocular muscles are intact, but there may be slight decrease in downgaze.  She has pseudobulbar speech.  Soft palate rises symmetrically and there is no tongue deviation. Hearing is intact to conversational tone. Sensation: Sensation is intact to light touch throughout Motor: Strength is 5/5 in the bilateral upper and lower extremities.   Shoulder shrug is equal and symmetric.  There is no pronator drift.  Movement examination: Tone: There is there is normal tone in the upper and lower extremities bilaterally. Abnormal movements: There is rare left upper extremity resting tremor. Coordination:  There is mild decremation with RAM's, especially with finger taps bilaterally. Gait and Station: The patient has no difficulty arising out of a deep-seated chair without the use of the hands. The patient's stride length is normal with good armswing.    LABS    Chemistry      Component Value Date/Time   NA 141 01/16/2018 1205   NA 142 04/27/2017   K 4.6 01/16/2018 1205   CL 104 01/16/2018 1205   CO2 30 01/16/2018 1205   BUN 23 01/16/2018 1205   BUN 23 (A) 04/27/2017   CREATININE 1.22 (H) 01/16/2018 1205   GLU 154 04/27/2017      Component Value Date/Time   CALCIUM 10.0 01/16/2018 1205   ALKPHOS 55 04/27/2017   AST 14 04/27/2017   ALT 10 04/27/2017   BILITOT 0.6 06/03/2016 1444     Lab Results  Component Value Date   TSH 0.30 (A) 04/27/2017   Lab Results  Component Value Date   [id number] 245 03/22/2018     ASSESSMENT/PLAN:  1.  Parkinsonism.  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  Regardless, levodopa seems to be working.  -She will continue with carbidopa/levodopa 25/100, 1-1/2 tablets 3 times per  day.  -she is back to [facility] cycle.  Discussed importance of continuing.  She enjoys the class  -Invited to the PARTS program.  2.  Dysphagia  -She had a modified barium swallow on 10/13/2016 that demonstrated mild pharyngeal phase dysphagia.  Regular diet with thin liquids was recommended.  3.  Low blood pressure  -Much better after blood pressure medications were discontinued.  4.  RBD  -watching closely. She doesn't want more medication but was agreeable to trying safety rails and talked to her about [place] them on [place]    5.  Follow-up with me in 5 months, sooner should new neurologic issues arise.     Cc:  Lucretia Kern, DO

## 2018-05-24 ENCOUNTER — Encounter: Payer: Self-pay | Admitting: Neurology

## 2018-05-24 ENCOUNTER — Ambulatory Visit: Payer: Medicare Other | Admitting: Neurology

## 2018-05-24 VITALS — BP 128/60 | HR 78 | Ht 65.0 in | Wt 140.0 lb

## 2018-05-24 DIAGNOSIS — I952 Hypotension due to drugs: Secondary | ICD-10-CM | POA: Diagnosis not present

## 2018-05-24 DIAGNOSIS — G20 Parkinson's disease: Secondary | ICD-10-CM | POA: Diagnosis not present

## 2018-05-24 NOTE — Patient Instructions (Signed)
  [facility] Parkinson's [facility] Movement [facility]  The [facility] team know that living well with a movement disorder extends far beyond our clinic walls. We are together with you. Our team is passionate about providing resources to you and your loved ones who are living with Parkinson's disease and movement disorders. Participate in these programs and join our community. These resources are free or low cost!   [facility] is adding:   Innovative educational programs for patients and caregivers.   Support groups for patients and caregivers living with Parkinson's disease.   Parkinson's specific exercise programs.   Custom tailored therapeutic programs that will benefit patient's living with Parkinson's disease.   We are in this together. You can help and contribute to grow these programs and resources in our community. 100% of the funds donated to the Movement Disorders Fund stays right here in our community to support patients and their caregivers.  To make a tax deductible contribution:  -ask for a [facility] Parkinson's envelope in the office today.  - call the Office of Institutional Advancement at [phone].         

## 2018-05-28 ENCOUNTER — Ambulatory Visit
Admission: RE | Admit: 2018-05-28 | Discharge: 2018-05-28 | Disposition: A | Discharge status: Home / Self Care | Payer: Medicare Other | Source: Ambulatory Visit | Attending: Family Medicine | Admitting: Family Medicine

## 2018-05-28 DIAGNOSIS — Z1231 Encounter for screening mammogram for malignant neoplasm of breast: Secondary | ICD-10-CM

## 2018-05-30 ENCOUNTER — Other Ambulatory Visit: Payer: Self-pay | Admitting: Urology

## 2018-05-30 DIAGNOSIS — D3000 Benign neoplasm of unspecified kidney: Secondary | ICD-10-CM

## 2018-05-31 ENCOUNTER — Encounter: Payer: Self-pay | Admitting: Family Medicine

## 2018-06-07 ENCOUNTER — Ambulatory Visit (HOSPITAL_COMMUNITY)
Admission: RE | Admit: 2018-06-07 | Discharge: 2018-06-07 | Disposition: A | Discharge status: Home / Self Care | Payer: Medicare Other | Source: Ambulatory Visit | Attending: Urology | Admitting: Urology

## 2018-06-07 DIAGNOSIS — D3000 Benign neoplasm of unspecified kidney: Secondary | ICD-10-CM | POA: Diagnosis not present

## 2018-06-07 DIAGNOSIS — K862 Cyst of pancreas: Secondary | ICD-10-CM | POA: Insufficient documentation

## 2018-06-07 LAB — POCT I-STAT CREATININE: Creatinine, Ser: 1.5 mg/dL — ABNORMAL HIGH (ref 0.44–1.00)

## 2018-06-07 MED ORDER — GADOBENATE DIMEGLUMINE 529 MG/ML IV SOLN
15.0000 mL | Freq: Once | INTRAVENOUS | Status: AC | PRN
  Administered 2018-06-07: 7 mL via INTRAVENOUS

## 2018-06-13 ENCOUNTER — Ambulatory Visit: Payer: Medicare Other | Admitting: Cardiovascular Disease

## 2018-06-13 ENCOUNTER — Encounter: Payer: Self-pay | Admitting: Cardiovascular Disease

## 2018-06-13 DIAGNOSIS — E1159 Type 2 diabetes mellitus with other circulatory complications: Secondary | ICD-10-CM | POA: Diagnosis not present

## 2018-06-13 DIAGNOSIS — E785 Hyperlipidemia, unspecified: Secondary | ICD-10-CM

## 2018-06-13 DIAGNOSIS — E1169 Type 2 diabetes mellitus with other specified complication: Secondary | ICD-10-CM

## 2018-06-13 DIAGNOSIS — I10 Essential (primary) hypertension: Secondary | ICD-10-CM

## 2018-06-13 DIAGNOSIS — I152 Hypertension secondary to endocrine disorders: Secondary | ICD-10-CM

## 2018-06-13 NOTE — Assessment & Plan Note (Signed)
History of hyperlipidemia on statin therapy with lipid profile performed 05/22/2018 revealing total cholesterol 155, LDL 64 HDL 69.

## 2018-06-13 NOTE — Assessment & Plan Note (Signed)
3 of essential hypertension with blood pressure measured at 134/62.  She is on hydrochlorothiazide and losartan.  Continue current meds at current dosing

## 2018-06-13 NOTE — Patient Instructions (Signed)

## 2018-06-13 NOTE — Progress Notes (Signed)
06/13/2018 Pamela Patton   [birth year]  [id number]  Primary Physician Lucretia Kern, DO Primary Cardiologist: Lorretta Harp MD Lupe Carney, [place]  HPI:  Pamela Patton is a 72 y.o.  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 I last saw her in the office  03/21/2017. Her risk factor profile is remarkable for diabetes, hypertension and hyperlipidemia. She had a Myoview performed 2 years ago which showed breast attenuation artifact. She has been a symptomatic since I saw her urinary health ago. She has developed Parkinson's disease in the interim. Since I saw her a year ago her Parkinson's is remained stable.  She is in a bicycle exercise class for this.  She had one episode of chest pain back in March which is similar to her previous episodes.  Otherwise she is remained stable.  Current Meds  Medication Sig  . aspirin 81 MG tablet Take 81 mg by mouth daily with lunch.   Marland Kitchen atorvastatin (LIPITOR) 40 MG tablet Take 40 mg by mouth daily with lunch.   . Calcium Carbonate-Vitamin D (CALTRATE 600+D) 600-400 MG-UNIT per tablet Take 1 tablet by mouth 3 (three) times a week.   . carbidopa-levodopa (SINEMET IR) 25-100 MG tablet TAKE 1.5 TABLETS BY MOUTH 3 (THREE) TIMES DAILY.  Marland Kitchen Cholecalciferol (VITAMIN D PO) Take 5,000 Units by mouth 3 (three) times a week.   . Cyanocobalamin (VITAMIN B12 PO) Take 1 tablet by mouth daily.  Marland Kitchen glucose 4 GM chewable tablet Chew 1 tablet by mouth as needed for low blood sugar.  Marland Kitchen HUMALOG KWIKPEN 100 UNIT/ML KiwkPen Inject 0-4 Units into the skin 3 (three) times daily with meals.   . hydrochlorothiazide (HYDRODIURIL) 25 MG tablet Take 25 mg by mouth daily with lunch.   . insulin glargine (LANTUS) 100 UNIT/ML injection Inject 15 Units into the skin every evening.   Marland Kitchen levothyroxine (SYNTHROID, LEVOTHROID) 75 MCG tablet Take 75 mcg by mouth daily.  Marland Kitchen losartan (COZAAR) 50 MG  tablet TAKE 1 TABLET BY MOUTH DAILY  . METFORMIN HCL PO Take 500 mg by mouth 2 (two) times daily.   . Nutritional Supplements (FEEDING SUPPLEMENT, GLUCERNA 1.2 CAL,) LIQD Place 237 mLs into feeding tube daily with breakfast.  . Pioglitazone HCl (ACTOS PO) Take 45 mg by mouth daily with lunch.   Vladimir Faster Glycol-Propyl Glycol (SYSTANE) 0.4-0.3 % GEL ophthalmic gel Place 1 application into both eyes at bedtime as needed (FOR EYES).   Current Facility-Administered Medications for the 06/13/18 encounter (Office Visit) with Lorretta Harp, MD  Medication  . 0.9 %  sodium chloride infusion     No Known Allergies  Social History   Socioeconomic History  . Marital status: Single    Spouse name: Not on file  . Number of children: Not on file  . Years of education: Not on file  . Highest education level: Not on file  Occupational History  . 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 Financial resource strain: Not on file  . Food insecurity:    Worry: Not on file    Inability: Not on file  . Transportation needs:    Medical: Not on file    Non-medical: Not on file  Tobacco Use  . Smoking status: Never Smoker  . Smokeless tobacco: Never Used  Substance and Sexual Activity  . Alcohol  use: No    Alcohol/week: 0.0 standard drinks  . Drug use: No  . Sexual activity: Not on file  Lifestyle  . Physical activity:    Days per week: Not on file    Minutes per session: Not on file  . Stress: Not on file  Relationships  . Social connections:    Talks on phone: Not on file    Gets together: Not on file    Attends religious service: Not on file    Active member of club or organization: Not on file    Attends meetings of clubs or organizations: Not on file    Relationship status: Not on file  . Intimate partner violence:    Fear of current or ex partner: Not on file    Emotionally abused: Not on file    Physically abused: Not on  file    Forced sexual activity: Not on file  Other Topics Concern  . 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  Cardiovascular: negative for chest pain, dyspnea on exertion, edema, orthopnea, palpitations, paroxysmal nocturnal dyspnea or shortness of breath Dermatological: negative for rash Respiratory: negative for cough or wheezing Urologic: negative for hematuria Abdominal: negative for nausea, vomiting, diarrhea, bright red blood per rectum, melena, or hematemesis Neurologic: negative for visual changes, syncope, or dizziness All other systems reviewed and are otherwise negative except as noted above.    Blood pressure 134/62, pulse 70, height 5\' 5"  (1.651 m), weight 142 lb 6.4 oz (64.6 kg).  General appearance: alert and no distress Neck: no adenopathy, no carotid bruit, no JVD, supple, symmetrical, trachea midline and thyroid not enlarged, symmetric, no tenderness/mass/nodules Lungs: clear to auscultation bilaterally Heart: regular rate and rhythm, S1, S2 normal, no murmur, click, rub or gallop Extremities: extremities normal, atraumatic, no cyanosis or edema Pulses: 2+ and symmetric Skin: Skin color, texture, turgor normal. No rashes or lesions Neurologic: Alert and oriented X 3, normal strength and tone. Normal symmetric reflexes. Normal coordination and gait  EKG not performed today  ASSESSMENT AND PLAN:   Hypertension associated with diabetes ([place]) 3 of essential hypertension with blood pressure measured at 134/62.  She is on  hydrochlorothiazide and losartan.  Continue current meds at current dosing  Hyperlipidemia associated with type 2 diabetes mellitus ([place]) History of hyperlipidemia on statin therapy with lipid profile performed 05/22/2018 revealing total cholesterol 155, LDL 64 HDL 69.      Lorretta Harp MD [facility], [facility] 06/13/2018 9:17 AM

## 2018-06-22 ENCOUNTER — Encounter: Payer: Self-pay | Admitting: Family Medicine

## 2018-07-05 ENCOUNTER — Other Ambulatory Visit: Payer: Self-pay | Admitting: Neurology

## 2018-08-03 LAB — HM DIABETES EYE EXAM

## 2018-08-06 ENCOUNTER — Encounter: Payer: Self-pay | Admitting: Family Medicine

## 2018-08-23 ENCOUNTER — Ambulatory Visit: Payer: Medicare Other | Admitting: Family Medicine

## 2018-09-06 ENCOUNTER — Other Ambulatory Visit: Payer: Self-pay | Admitting: Family Medicine

## 2018-09-11 ENCOUNTER — Encounter: Payer: Self-pay | Admitting: Family Medicine

## 2018-09-11 ENCOUNTER — Ambulatory Visit: Payer: Medicare Other | Admitting: Family Medicine

## 2018-09-11 VITALS — BP 120/50 | HR 65 | Temp 98.2°F | Ht 65.0 in | Wt 143.7 lb

## 2018-09-11 DIAGNOSIS — L608 Other nail disorders: Secondary | ICD-10-CM

## 2018-09-11 DIAGNOSIS — E1159 Type 2 diabetes mellitus with other circulatory complications: Secondary | ICD-10-CM

## 2018-09-11 DIAGNOSIS — I10 Essential (primary) hypertension: Secondary | ICD-10-CM

## 2018-09-11 DIAGNOSIS — E1169 Type 2 diabetes mellitus with other specified complication: Secondary | ICD-10-CM

## 2018-09-11 DIAGNOSIS — E039 Hypothyroidism, unspecified: Secondary | ICD-10-CM

## 2018-09-11 DIAGNOSIS — G20 Parkinson's disease: Secondary | ICD-10-CM

## 2018-09-11 DIAGNOSIS — E1129 Type 2 diabetes mellitus with other diabetic kidney complication: Secondary | ICD-10-CM | POA: Diagnosis not present

## 2018-09-11 DIAGNOSIS — N183 Chronic kidney disease, stage 3 unspecified: Secondary | ICD-10-CM

## 2018-09-11 DIAGNOSIS — Z794 Long term (current) use of insulin: Secondary | ICD-10-CM

## 2018-09-11 DIAGNOSIS — E785 Hyperlipidemia, unspecified: Secondary | ICD-10-CM

## 2018-09-11 LAB — BASIC METABOLIC PANEL
BUN: 27 mg/dL — ABNORMAL HIGH (ref 6–23)
CALCIUM: 9.7 mg/dL (ref 8.4–10.5)
CO2: 32 mEq/L (ref 19–32)
CREATININE: 1.35 mg/dL — AB (ref 0.40–1.20)
Chloride: 107 mEq/L (ref 96–112)
GFR: 49.31 mL/min — ABNORMAL LOW (ref >60.00)
Glucose, Bld: 123 mg/dL — ABNORMAL HIGH (ref 70–99)
Potassium: 4.3 mEq/L (ref 3.5–5.1)
Sodium: 145 mEq/L (ref 135–145)

## 2018-09-11 LAB — CBC
HCT: 31.3 % — ABNORMAL LOW (ref 36.0–46.0)
Hemoglobin: 10.3 g/dL — ABNORMAL LOW (ref 12.0–15.0)
MCHC: 32.9 g/dL (ref 30.0–36.0)
MCV: 90 fl (ref 78.0–100.0)
Platelets: 204 10*3/uL (ref 150.0–400.0)
RBC: 3.48 Mil/uL — ABNORMAL LOW (ref 3.87–5.11)
RDW: 16.1 % — AB (ref 11.5–15.5)
WBC: 4.6 10*3/uL (ref 4.0–10.5)

## 2018-09-11 LAB — HEMOGLOBIN A1C: HEMOGLOBIN A1C: 8.1 % — AB (ref 4.6–6.5)

## 2018-09-11 NOTE — Patient Instructions (Addendum)
BEFORE YOU LEAVE: -labs -follow up: 3-4 months   -We placed a referral for you as discussed to the foot doctor. It usually takes about 1-2 weeks to process and schedule this referral. If you have not heard from [place] regarding this appointment in 2 weeks please contact our office.   We have ordered labs or studies at this visit. It can take up to 1-2 weeks for results and processing. IF results require follow up or explanation, we will call you with instructions. Clinically stable results will be released to your [facility]. If you have not heard from [place] or cannot find your results in [facility] in 2 weeks please contact our office at [phone].  If you are not yet signed up for [facility], please consider signing up.   We recommend the following healthy lifestyle for LIFE: 1) Small portions. But, make sure to get regular (at least 3 per day), healthy meals and small healthy snacks if needed.  2) Eat a healthy clean diet.   TRY TO EAT: -at least 5-7 servings of low sugar, colorful, and nutrient rich vegetables per day (not corn, potatoes or bananas.) -berries are the best choice if you wish to eat fruit (only eat small amounts if trying to reduce weight)  -lean meets (fish, white meat of chicken or [place]) -vegan proteins for some meals - beans or tofu, whole grains, nuts and seeds -Replace bad fats with good fats - good fats include: fish, nuts and seeds, canola oil, olive oil -small amounts of low fat or non fat dairy -small amounts of100 % whole grains - check the lables -drink plenty of water  AVOID: -SUGAR, sweets, anything with added sugar, corn syrup or sweeteners - must read labels as even foods advertised as "healthy" often are loaded with sugar -if you must have a sweetener, small amounts of stevia may be best -sweetened beverages and artificially sweetened beverages -simple starches (rice, bread, potatoes, pasta, chips, etc - small amounts of 100% whole grains are ok) -red meat,  pork, butter -fried foods, fast food, processed food, excessive dairy, eggs and coconut.  3)Get at least 150 minutes of sweaty aerobic exercise per week.  4)Reduce stress - consider counseling, meditation and relaxation to balance other aspects of your life.

## 2018-09-11 NOTE — Progress Notes (Signed)
HPI:  Using dictation device. Unfortunately this device frequently misinterprets words/phrases.  Pamela Patton is a pleasant 72 y.o. here for follow up. Chronic medical problems summarized below were reviewed for changes and stability and were updated as needed below. These issues and their treatment remain stable for the most part.  Still cycling and enjoys this.  Reports she did follow-up with Dr. Louis Meckel about her kidneys and they will be watching the mass on 1 year follow-ups.  No new concerns or complaints except for she has a thickened toenail on her third toe on the left foot that bothers her and she would like to see a foot specialist about this. Denies CP, SOB, DOE, treatment intolerance or new symptoms. Due for flu vaccine - done 3 weeks ago, foot exam, labs  AWV 05/2018  HTN: -meds:Aspirin,losartan 50, hctz 25 -had negative cardiology eval, EKG and trop for CP in 11/2017 ER visit -sees cardiologist for HTN and ? Carotid art dz - but neg carotid duplex in 2018 per cardiology notes "Essentially normal study. Repeat when clinically indicated." Dr. Gwenlyn Found  Diabetes w/ CKD: -meds: humalog, lantus, metformin, pioglitazone, asa, arb -sees Dr. Elyse Hsu for management  Hypothyroidism: -meds: levothyroxine -sees Dr. Elyse Hsu for management  HLD: -meds: atorvastatin  Parkinson Dz: -sees Dr. Carles Collet for management -meds: sinemet  Hx of Anemia: -neg eval iron studies, b12 and folate with GI in 6/19 and they advised EGD, but doubted GI source -advised seeing Dr. Louis Meckel as well whom follows her for her renal mass and CKD  Hx CKD/Renal mass: -sees Dr. Louis Meckel for managment  ROS: See pertinent positives and negatives per HPI.  Past Medical History:  Diagnosis Date  . Anemia   . Arthritis   . Cataract    immature   . Chest pain summer 2012  . Chicken pox   . Diverticulosis    difficulty with colonoscopy 2005  . DM (diabetes mellitus), type 2 with renal complications  managed by endocrinologist, Dr. Elyse Hsu 10/08/2012  . Fibroid    fibroid tumor  . Fracture    radial/ulnar 03/2016  . GERD (gastroesophageal reflux disease)   . Heart murmur    innocent murmur  . High cholesterol   . Hypertension   . Hypothyroid, s/p radioactive iodine tx for graves 2001 08/24/2012  . Intestinal metaplasia of gastric mucosa   . Kidney disease   . PD (Parkinson's disease) ([place]) 08/08/2016  . Renal cancer ([place])   . UTI (urinary tract infection)   . Weight loss    eval with GI in 2016; s/p CT/MRI and PET scans    Past Surgical History:  Procedure Laterality Date  . ABDOMINAL HYSTERECTOMY  1989  . CATARACT EXTRACTION, BILATERAL    . COLONOSCOPY    . DG BARIUM SWALLOW ([place] [facility])     9 yrs ago per pt due to incomplete colonsocopy    Family History  Problem Relation Age of Onset  . Cancer Mother        liver  . Liver cancer Mother   . Stroke Father 87  . Diabetes Father   . Hypertension Father   . Diabetes Maternal Grandmother   . Prostate cancer Maternal Grandfather   . Pancreatic cancer Brother   . Diabetes Sister   . Cancer Sister   . Diabetes Sister   . Colon polyps Neg Hx   . Colon cancer Neg Hx   . Esophageal cancer Neg Hx   . Rectal cancer Neg Hx   .  Stomach cancer Neg Hx     SOCIAL HX: See HPI   Current Outpatient Medications:  .  aspirin 81 MG tablet, Take 81 mg by mouth daily with lunch. , Disp: , Rfl:  .  atorvastatin (LIPITOR) 40 MG tablet, Take 40 mg by mouth daily with lunch. , Disp: , Rfl:  .  Calcium Carbonate-Vitamin D (CALTRATE 600+D) 600-400 MG-UNIT per tablet, Take 1 tablet by mouth 3 (three) times a week. , Disp: , Rfl:  .  carbidopa-levodopa (SINEMET IR) 25-100 MG tablet, TAKE 1 & 1/2 TABLETS BY MOUTH 3 TIMES DAILY, Disp: 405 tablet, Rfl: 1 .  Cholecalciferol (VITAMIN D PO), Take 5,000 Units by mouth 3 (three) times a week. , Disp: , Rfl:  .  Cyanocobalamin (VITAMIN B12 PO), Take 1 tablet by mouth daily., Disp: , Rfl:  .   glucose 4 GM chewable tablet, Chew 1 tablet by mouth as needed for low blood sugar., Disp: , Rfl:  .  HUMALOG KWIKPEN 100 UNIT/ML KiwkPen, Inject 0-4 Units into the skin 3 (three) times daily with meals. , Disp: , Rfl:  .  hydrochlorothiazide (HYDRODIURIL) 25 MG tablet, Take 25 mg by mouth daily with lunch. , Disp: , Rfl:  .  insulin glargine (LANTUS) 100 UNIT/ML injection, Inject 15 Units into the skin every evening. , Disp: , Rfl:  .  levothyroxine (SYNTHROID, LEVOTHROID) 75 MCG tablet, Take 75 mcg by mouth daily., Disp: , Rfl:  .  losartan (COZAAR) 50 MG tablet, TAKE 1 TABLET BY MOUTH DAILY, Disp: 90 tablet, Rfl: 1 .  METFORMIN HCL PO, Take 500 mg by mouth 2 (two) times daily. , Disp: , Rfl:  .  Nutritional Supplements (FEEDING SUPPLEMENT, GLUCERNA 1.2 CAL,) LIQD, Place 237 mLs into feeding tube daily with breakfast., Disp: , Rfl:  .  Pioglitazone HCl (ACTOS PO), Take 45 mg by mouth daily with lunch. , Disp: , Rfl:  .  Polyethyl Glycol-Propyl Glycol (SYSTANE) 0.4-0.3 % GEL ophthalmic gel, Place 1 application into both eyes at bedtime as needed (FOR EYES)., Disp: , Rfl:   Current Facility-Administered Medications:  .  0.9 %  sodium chloride infusion, 500 mL, Intravenous, Continuous, Armbruster, Carlota Raspberry, MD  EXAM:  Vitals:   09/11/18 1350  BP: (!) 120/50  Pulse: 65  Temp: 98.2 F (36.8 C)    Body mass index is 23.91 kg/m.  GENERAL: vitals reviewed and listed above, alert, oriented, appears well hydrated and in no acute distress  HEENT: atraumatic, conjunttiva clear, no obvious abnormalities on inspection of external nose and ears  NECK: no obvious masses on inspection  LUNGS: clear to auscultation bilaterally, no wheezes, rales or rhonchi, good air movement  CV: HRRR, no peripheral edema  MS: moves all extremities without noticeable abnormality  Foot exam done, see epic  PSYCH: pleasant and cooperative, no obvious depression or anxiety  ASSESSMENT AND PLAN:  Discussed  the following assessment and plan:  Hypertension associated with diabetes ([place]) - Plan: Basic metabolic panel, CBC  Hypothyroidism, unspecified type  Type 2 diabetes mellitus with other diabetic kidney complication, with long-term current use of insulin ([place]) - Plan: Hemoglobin A1c, Ambulatory referral to Podiatry  Hyperlipidemia associated with type 2 diabetes mellitus ([place])  CKD (chronic kidney disease) stage 3, GFR 30-59 ml/min ([facility])  PD (Parkinson's disease) ([place])  Toenail deformity - Plan: Ambulatory referral to Podiatry  -Labs per orders -Lifestyle recommendations -Congratulated her on continued healthy lifestyle -Referral to podiatry  -3 to 4 months, sooner as needed -Patient  advised to return or notify a doctor immediately if symptoms worsen or persist or new concerns arise.  Patient Instructions  BEFORE YOU LEAVE: -labs -follow up: 3-4 months   -We placed a referral for you as discussed to the foot doctor. It usually takes about 1-2 weeks to process and schedule this referral. If you have not heard from [place] regarding this appointment in 2 weeks please contact our office.   We have ordered labs or studies at this visit. It can take up to 1-2 weeks for results and processing. IF results require follow up or explanation, we will call you with instructions. Clinically stable results will be released to your [facility]. If you have not heard from [place] or cannot find your results in [facility] in 2 weeks please contact our office at [phone].  If you are not yet signed up for [facility], please consider signing up.   We recommend the following healthy lifestyle for LIFE: 1) Small portions. But, make sure to get regular (at least 3 per day), healthy meals and small healthy snacks if needed.  2) Eat a healthy clean diet.   TRY TO EAT: -at least 5-7 servings of low sugar, colorful, and nutrient rich vegetables per day (not corn, potatoes or bananas.) -berries are the best choice if  you wish to eat fruit (only eat small amounts if trying to reduce weight)  -lean meets (fish, white meat of chicken or [place]) -vegan proteins for some meals - beans or tofu, whole grains, nuts and seeds -Replace bad fats with good fats - good fats include: fish, nuts and seeds, canola oil, olive oil -small amounts of low fat or non fat dairy -small amounts of100 % whole grains - check the lables -drink plenty of water  AVOID: -SUGAR, sweets, anything with added sugar, corn syrup or sweeteners - must read labels as even foods advertised as "healthy" often are loaded with sugar -if you must have a sweetener, small amounts of stevia may be best -sweetened beverages and artificially sweetened beverages -simple starches (rice, bread, potatoes, pasta, chips, etc - small amounts of 100% whole grains are ok) -red meat, pork, butter -fried foods, fast food, processed food, excessive dairy, eggs and coconut.  3)Get at least 150 minutes of sweaty aerobic exercise per week.  4)Reduce stress - consider counseling, meditation and relaxation to balance other aspects of your life.         Lucretia Kern, DO

## 2018-09-13 MED ORDER — LOSARTAN POTASSIUM 25 MG PO TABS: 50.0000 mg | ORAL_TABLET | Freq: Every day | ORAL | 3 refills | Status: DC

## 2018-09-13 NOTE — Addendum Note (Signed)
Addended by: Agnes Lawrence on: 09/13/2018 10:03 AM   Modules accepted: Orders

## 2018-09-13 NOTE — Addendum Note (Signed)
Addended by: Westley Hummer B on: 09/13/2018 10:06 AM   Modules accepted: Orders

## 2018-09-13 NOTE — Telephone Encounter (Signed)
Rx done. 

## 2018-09-13 NOTE — Telephone Encounter (Signed)
Ok to fill per pharmacy request

## 2018-09-13 NOTE — Telephone Encounter (Signed)
Ok to refill per current dose, #90, 3 refills. Thanks.

## 2018-09-25 ENCOUNTER — Encounter: Payer: Self-pay | Admitting: Family Medicine

## 2018-09-25 ENCOUNTER — Ambulatory Visit: Payer: Medicare Other | Admitting: Family Medicine

## 2018-09-25 VITALS — BP 138/60 | HR 60 | Temp 98.5°F | Ht 65.0 in | Wt 142.4 lb

## 2018-09-25 DIAGNOSIS — L0292 Furuncle, unspecified: Secondary | ICD-10-CM | POA: Diagnosis not present

## 2018-09-25 DIAGNOSIS — L0390 Cellulitis, unspecified: Secondary | ICD-10-CM | POA: Diagnosis not present

## 2018-09-25 MED ORDER — DOXYCYCLINE HYCLATE 100 MG PO TABS: 100.0000 mg | ORAL_TABLET | Freq: Two times a day (BID) | ORAL | 0 refills | Status: DC

## 2018-09-25 NOTE — Patient Instructions (Signed)
BEFORE YOU LEAVE: -follow up: 1 week  Start the antibiotic.  Use compresses twice daily.  I hope you are feeling better soon! Seek care promptly if your symptoms worsen, new concerns arise or you are not improving with treatment. If worsening over the weekend/holiday or not improving over the next 24-48 hours please seek evaluation at the urgent care.

## 2018-09-25 NOTE — Progress Notes (Signed)
HPI:  Using dictation device. Unfortunately this device frequently misinterprets words/phrases.  Acute visit for boil: -reports gets these from time to time in various locations -this started about 4 days ago - feels better now that it drained spontaneously -no fevers, malaise, chills, rapid worsening -under L breast -mammo 05/2018  ROS: See pertinent positives and negatives per HPI.  Past Medical History:  Diagnosis Date  . Anemia   . Arthritis   . Cataract    immature   . Chest pain summer 2012  . Chicken pox   . Diverticulosis    difficulty with colonoscopy 2005  . 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 Elyse Hsu 10/08/2012  . Fibroid    fibroid tumor  . Fracture    radial/ulnar 03/2016  . GERD (gastroesophageal reflux disease)   . Heart murmur    innocent murmur  . High cholesterol   . Hypertension   . Hypothyroid, s/p radioactive iodine tx for graves 2001 08/24/2012  . Intestinal metaplasia of gastric mucosa   . Kidney disease   . PD (Parkinson's disease) ([place]) 08/08/2016  . Renal cancer ([place])   . UTI (urinary tract infection)   . Weight loss    eval with GI in 2016; s/p CT/MRI and PET scans    Past Surgical History:  Procedure Laterality Date  . ABDOMINAL HYSTERECTOMY  1989  . CATARACT EXTRACTION, BILATERAL    . COLONOSCOPY    . DG BARIUM SWALLOW ([place] [facility])     9 yrs ago per pt due to incomplete colonsocopy    Family History  Problem Relation Age of Onset  . Cancer Mother        liver  . Liver cancer Mother   . Stroke Father 80  . Diabetes Father   . Hypertension Father   . Diabetes Maternal Grandmother   . Prostate cancer Maternal Grandfather   . Pancreatic cancer Brother   . Diabetes Sister   . Cancer Sister   . Diabetes Sister   . Colon polyps Neg Hx   . Colon cancer Neg Hx   . Esophageal cancer Neg Hx   . Rectal cancer Neg Hx   . Stomach cancer Neg Hx     SOCIAL HX: see hpi   Current  Outpatient Medications:  .  aspirin 81 MG tablet, Take 81 mg by mouth daily with lunch. , Disp: , Rfl:  .  atorvastatin (LIPITOR) 40 MG tablet, Take 40 mg by mouth daily with lunch. , Disp: , Rfl:  .  Calcium Carbonate-Vitamin D (CALTRATE 600+D) 600-400 MG-UNIT per tablet, Take 1 tablet by mouth 3 (three) times a week. , Disp: , Rfl:  .  carbidopa-levodopa (SINEMET IR) 25-100 MG tablet, TAKE 1 & 1/2 TABLETS BY MOUTH 3 TIMES DAILY, Disp: 405 tablet, Rfl: 1 .  Cholecalciferol (VITAMIN D PO), Take 5,000 Units by mouth 3 (three) times a week. , Disp: , Rfl:  .  Cyanocobalamin (VITAMIN B12 PO), Take 1 tablet by mouth daily., Disp: , Rfl:  .  glucose 4 GM chewable tablet, Chew 1 tablet by mouth as needed for low blood sugar., Disp: , Rfl:  .  HUMALOG KWIKPEN 100 UNIT/ML KiwkPen, Inject 0-4 Units into the skin 3 (three) times daily with meals. , Disp: , Rfl:  .  hydrochlorothiazide (HYDRODIURIL) 25 MG tablet, Take 25 mg by mouth daily with lunch. , Disp: , Rfl:  .  insulin glargine (LANTUS) 100 UNIT/ML injection, Inject 15 Units into  the skin every evening. , Disp: , Rfl:  .  levothyroxine (SYNTHROID, LEVOTHROID) 75 MCG tablet, Take 75 mcg by mouth daily., Disp: , Rfl:  .  losartan (COZAAR) 25 MG tablet, Take 2 tablets (50 mg total) by mouth daily., Disp: 180 tablet, Rfl: 3 .  METFORMIN HCL PO, Take 500 mg by mouth 2 (two) times daily. , Disp: , Rfl:  .  Nutritional Supplements (FEEDING SUPPLEMENT, GLUCERNA 1.2 CAL,) LIQD, Place 237 mLs into feeding tube daily with breakfast., Disp: , Rfl:  .  Pioglitazone HCl (ACTOS PO), Take 45 mg by mouth daily with lunch. , Disp: , Rfl:  .  Polyethyl Glycol-Propyl Glycol (SYSTANE) 0.4-0.3 % GEL ophthalmic gel, Place 1 application into both eyes at bedtime as needed (FOR EYES)., Disp: , Rfl:  .  doxycycline (VIBRA-TABS) 100 MG tablet, Take 1 tablet (100 mg total) by mouth 2 (two) times daily., Disp: 14 tablet, Rfl: 0  Current Facility-Administered Medications:  .   0.9 %  sodium chloride infusion, 500 mL, Intravenous, Continuous, Armbruster, Carlota Raspberry, MD  EXAM:  Vitals:   09/25/18 0900  BP: 138/60  Pulse: 60  Temp: 98.5 F (36.9 C)    Body mass index is 23.7 kg/m.  GENERAL: vitals reviewed and listed above, alert, oriented, appears well hydrated and in no acute distress  HEENT: atraumatic, conjunttiva clear, no obvious abnormalities on inspection of external nose and ears  NECK: no obvious masses on inspection  SKIN: draining boil under L breast, mild erythema/edema in small area surrounding  MS: moves all extremities without noticeable abnormality  PSYCH: pleasant and cooperative, no obvious depression or anxiety  ASSESSMENT AND PLAN:  Discussed the following assessment and plan:  Boil  Cellulitis, unspecified cellulitis site  -we discussed possible serious and likely etiologies, workup and treatment, treatment risks and return precautions - likely inflamed or infected cyst with possible mild cellulitis, draining.  -after this discussion, Pamela Patton opted for compresses, doxy, close follow up. She opted against incision. -follow up advised 1 week -of course, we advised Pamela Patton  to return or notify a doctor immediately if symptoms worsen or persist or new concerns arise. Discussed options for the holiday/weekend.   Patient Instructions  BEFORE YOU LEAVE: -follow up: 1 week  Start the antibiotic.  Use compresses twice daily.  I hope you are feeling better soon! Seek care promptly if your symptoms worsen, new concerns arise or you are not improving with treatment. If worsening over the weekend/holiday or not improving over the next 24-48 hours please seek evaluation at the urgent care.      Lucretia Kern, DO

## 2018-10-03 NOTE — Progress Notes (Signed)
HPI:  Using dictation device. Unfortunately this device frequently misinterprets words/phrases.  Follow up Boil chest: -L inframammary region -started around 09/21/18 spontaneously drained, mild cellulitis treated with doxy 12/24 -reports:doing much better and almost entirely resolved, saw dermatology in the past for same on back x2 and had surgery for removal of those cysts -denies:pain, drainage, side effects to treatment  ROS: See pertinent positives and negatives per HPI.  Past Medical History:  Diagnosis Date  . Anemia   . Arthritis   . Cataract    immature   . Chest pain summer 2012  . Chicken pox   . Diverticulosis    difficulty with colonoscopy 2005  . 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 Elyse Hsu 10/08/2012  . Fibroid    fibroid tumor  . Fracture    radial/ulnar 03/2016  . GERD (gastroesophageal reflux disease)   . Heart murmur    innocent murmur  . High cholesterol   . Hypertension   . Hypothyroid, s/p radioactive iodine tx for graves 2001 08/24/2012  . Intestinal metaplasia of gastric mucosa   . Kidney disease   . PD (Parkinson's disease) ([place]) 08/08/2016  . Renal cancer ([place])   . UTI (urinary tract infection)   . Weight loss    eval with GI in 2016; s/p CT/MRI and PET scans    Past Surgical History:  Procedure Laterality Date  . ABDOMINAL HYSTERECTOMY  1989  . CATARACT EXTRACTION, BILATERAL    . COLONOSCOPY    . DG BARIUM SWALLOW ([place] [facility]X)     9 yrs ago per pt due to incomplete colonsocopy    Family History  Problem Relation Age of Onset  . Cancer Mother        liver  . Liver cancer Mother   . Stroke Father 26  . Diabetes Father   . Hypertension Father   . Diabetes Maternal Grandmother   . Prostate cancer Maternal Grandfather   . Pancreatic cancer Brother   . Diabetes Sister   . Cancer Sister   . Diabetes Sister   . Colon polyps Neg Hx   . Colon cancer Neg Hx   . Esophageal cancer Neg Hx    . Rectal cancer Neg Hx   . Stomach cancer Neg Hx     SOCIAL HX: see hpi   Current Outpatient Medications:  .  aspirin 81 MG tablet, Take 81 mg by mouth daily with lunch. , Disp: , Rfl:  .  atorvastatin (LIPITOR) 40 MG tablet, Take 40 mg by mouth daily with lunch. , Disp: , Rfl:  .  Calcium Carbonate-Vitamin D (CALTRATE 600+D) 600-400 MG-UNIT per tablet, Take 1 tablet by mouth 3 (three) times a week. , Disp: , Rfl:  .  carbidopa-levodopa (SINEMET IR) 25-100 MG tablet, TAKE 1 & 1/2 TABLETS BY MOUTH 3 TIMES DAILY, Disp: 405 tablet, Rfl: 1 .  Cholecalciferol (VITAMIN D PO), Take 5,000 Units by mouth 3 (three) times a week. , Disp: , Rfl:  .  Cyanocobalamin (VITAMIN B12 PO), Take 1 tablet by mouth daily., Disp: , Rfl:  .  glucose 4 GM chewable tablet, Chew 1 tablet by mouth as needed for low blood sugar., Disp: , Rfl:  .  HUMALOG KWIKPEN 100 UNIT/ML KiwkPen, Inject 0-4 Units into the skin 3 (three) times daily with meals. , Disp: , Rfl:  .  hydrochlorothiazide (HYDRODIURIL) 25 MG tablet, Take 25 mg by mouth daily with lunch. , Disp: , Rfl:  .  insulin glargine (LANTUS) 100 UNIT/ML injection, Inject 15 Units into the skin every evening. , Disp: , Rfl:  .  levothyroxine (SYNTHROID, LEVOTHROID) 75 MCG tablet, Take 75 mcg by mouth daily., Disp: , Rfl:  .  losartan (COZAAR) 25 MG tablet, Take 2 tablets (50 mg total) by mouth daily., Disp: 180 tablet, Rfl: 3 .  METFORMIN HCL PO, Take 500 mg by mouth 2 (two) times daily. , Disp: , Rfl:  .  Nutritional Supplements (FEEDING SUPPLEMENT, GLUCERNA 1.2 CAL,) LIQD, Place 237 mLs into feeding tube daily with breakfast., Disp: , Rfl:  .  Pioglitazone HCl (ACTOS PO), Take 45 mg by mouth daily with lunch. , Disp: , Rfl:  .  Polyethyl Glycol-Propyl Glycol (SYSTANE) 0.4-0.3 % GEL ophthalmic gel, Place 1 application into both eyes at bedtime as needed (FOR EYES)., Disp: , Rfl:   Current Facility-Administered Medications:  .  0.9 %  sodium chloride infusion, 500  mL, Intravenous, Continuous, Armbruster, Carlota Raspberry, MD  EXAM:  Vitals:   10/04/18 0850  BP: 120/70  Pulse: (!) 58  Temp: 98.4 F (36.9 C)    Body mass index is 23.7 kg/m.  GENERAL: vitals reviewed and listed above, alert, oriented, appears well hydrated and in no acute distress  HEENT: atraumatic, conjunttiva clear, no obvious abnormalities on inspection of external nose and ears  NECK: no obvious masses on inspection  SKIN: much improved appearance cyst L lower chest wall, small area of hyperpigmentation skin, no induration, erythema, warmth or pus  MS: moves all extremities without noticeable abnormality  PSYCH: pleasant and cooperative, no obvious depression or anxiety  ASSESSMENT AND PLAN:  Discussed the following assessment and plan:  Sebaceous cyst  -infection/inflamation/cellulitis resolved and healing great -advised to monitor and schedule follow up with dermatology in about 1 month as she desires removal -Patient advised to return or notify a doctor immediately if symptoms worsen or persist or new concerns arise.  There are no Patient Instructions on file for this visit.  Lucretia Kern, DO

## 2018-10-04 ENCOUNTER — Ambulatory Visit: Payer: Medicare Other | Admitting: Family Medicine

## 2018-10-04 ENCOUNTER — Encounter: Payer: Self-pay | Admitting: Family Medicine

## 2018-10-04 VITALS — BP 120/70 | HR 58 | Temp 98.4°F | Ht 65.0 in

## 2018-10-04 DIAGNOSIS — L723 Sebaceous cyst: Secondary | ICD-10-CM

## 2018-10-18 ENCOUNTER — Ambulatory Visit: Payer: Medicare Other | Admitting: Podiatry

## 2018-10-23 NOTE — Progress Notes (Signed)
Pamela Patton was seen today in the movement disorders clinic for neurologic consultation at the request of Lucretia Kern, DO.  The consultation is for the evaluation of gait change, cogwheel rigidity and to r/o PD.  The records that were made available to me were reviewed.  Pt reports that she has had unsteadiness at least since June.  She fell then and broke her radius/ulna.  States that she turned around at the sink and thinks that she tripped over her feet and "raggady bottom shoes"  10/11/16 update:  The patient follows up today.  I started her on carbidopa/levodopa 25/100 and she has worked up to one tablet 3 times daily.  "I think that it is helping."  When first started the whole tablets she had mild nausea for 5 minutes only but that is better.  Pt denies falls.  Pt denies lightheadedness, near syncope.  No hallucinations.  Mood has been "pretty good." The patient has been attending therapy at the outpatient rehabilitation center for physical, occupational and speech therapy. She will finish up OT/PT this week and has ST until second week of February.   I have reviewed notes.   A MBE was requested by speech therapy and this is scheduled for 10/13/2016.  No other CV exercise besides for therapy.    02/08/17 update:  Patient seen in follow-up today.  States that "I feel perfectly fine this AM but this past weekend I had abdominal and back pain."  Was better if relaxed and stretched.  She is unsure if it is related to timing of levodopa.   Admits that she was raking leaves a few days before when it started.   She is on carbidopa/levodopa 25/100, one tablet 3 times per day (7-9am/2-3pm/7pm).  She had a modified barium swallow on 10/13/2016 that demonstrated mild pharyngeal phase dysphagia.  Regular diet with thin liquids was recommended.  She had one fall in her sleep and fell off of the bed.  05/19/17 update:  Pt f/u today for Parkinsonism.  The records that were made available to me were reviewed since  last visit.  On carbidopa/levodopa 25/100 tid (8-8:30/12-12:30/6:00).  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  About 2 weeks ago, she did have a very vivid dream and she states that in general "I have been sleeping really nicely."   The records that were made available to me were reviewed, including therapy notes.  States was taken off of norvasc as BP's were going too low.  Wearing off:  No.  How long before next dose:  n/a Falls:   No. N/V:  No. Hallucinations:  No.  visual distortions: No. Lightheaded:  No.  Syncope: No. Dyskinesia:  No.   09/18/17 update: Patient seen today in follow-up for Parkinson's.  I have reviewed her records available to me since last visit.  I increased her levodopa last visit to carbidopa/levodopa 25/100, 1.5 tablets 3 times per day.  She states that she only ended up increasing the AM one as she thought that it was for tremor and it did help going up.  She did go to outpatient rehab since our last visit.  Those records are reviewed.  She was exercising with Parkinson's cycle class but she had cataract surgery and says she has been a little "off" with that.  She is back to going now and "it makes me feel good."  She has had no falls.  No lightheadedness or near syncope.  No hallucinations.  She denies any swallowing issues.  She has been seeing endocrinology for her diabetes.  05/24/18 update: Patient is seen today in follow-up for Parkinson's disease.  I have not seen her since December (no showed a visit in May).  Levodopa was increased so that she is now on carbidopa/levodopa 25/100, 1-1/2 tablets 3 times per day.  "That was good."  No significant changes since our last visit.  Still doing [employer] cycle class at [place]; "that makes me feel good."  No hallucinations.   Records are reviewed since her last visit.  She is in the emergency room in February with chest pain which was felt noncardiac in  nature.  Was last seen at primary care on May 08, 2018.  No changes were made.  She denies diplopia today.  She denies swallowing difficulties, and actually states that her voice is much better.  10/24/18 update: Patient seen today in follow-up for Parkinson's disease.  She is on carbidopa/levodopa 25/100, 1-1/2 tablets 3 times per day.  She has had no falls since last visit.  No lightheadedness or near syncope.  No hallucinations.  She is in [facility] cycle class 2 days a week but "I need to do more."  Medical records have been reviewed since our last visit.  She has been treated for a sebaceous cyst.  She states that it is better/gone.  ALLERGIES:  No Known Allergies  CURRENT MEDICATIONS:  Outpatient Encounter Medications as of 10/24/2018  Medication Sig  . aspirin 81 MG tablet Take 81 mg by mouth daily with lunch.   Marland Kitchen atorvastatin (LIPITOR) 40 MG tablet Take 40 mg by mouth daily with lunch.   . Calcium Carbonate-Vitamin D (CALTRATE 600+D) 600-400 MG-UNIT per tablet Take 1 tablet by mouth 3 (three) times a week.   . carbidopa-levodopa (SINEMET IR) 25-100 MG tablet TAKE 1 & 1/2 TABLETS BY MOUTH 3 TIMES DAILY  . Cholecalciferol (VITAMIN D PO) Take 5,000 Units by mouth 3 (three) times a week.   . Cyanocobalamin (VITAMIN B12 PO) Take 1 tablet by mouth daily.  Marland Kitchen glucose 4 GM chewable tablet Chew 1 tablet by mouth as needed for low blood sugar.  Marland Kitchen HUMALOG KWIKPEN 100 UNIT/ML KiwkPen Inject 0-4 Units into the skin 3 (three) times daily with meals.   . hydrochlorothiazide (HYDRODIURIL) 25 MG tablet Take 25 mg by mouth daily with lunch.   . insulin glargine (LANTUS) 100 UNIT/ML injection Inject 15 Units into the skin every evening.   Marland Kitchen levothyroxine (SYNTHROID, LEVOTHROID) 75 MCG tablet Take 75 mcg by mouth daily.  Marland Kitchen losartan (COZAAR) 25 MG tablet Take 2 tablets (50 mg total) by mouth daily.  Marland Kitchen METFORMIN HCL PO Take 500 mg by mouth 2 (two) times daily.   . Nutritional Supplements (FEEDING SUPPLEMENT,  GLUCERNA 1.2 CAL,) LIQD Place 237 mLs into feeding tube daily with breakfast.  . Pioglitazone HCl (ACTOS PO) Take 45 mg by mouth daily with lunch.   Vladimir Faster Glycol-Propyl Glycol (SYSTANE) 0.4-0.3 % GEL ophthalmic gel Place 1 application into both eyes at bedtime as needed (FOR EYES).   Facility-Administered Encounter Medications as of 10/24/2018  Medication  . 0.9 %  sodium chloride infusion    PAST MEDICAL HISTORY:   Past Medical History:  Diagnosis Date  . Anemia   . Arthritis   . Cataract    immature   . Chest pain summer 2012  . Chicken pox   .  Diverticulosis    difficulty with colonoscopy 2005  . 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 Elyse Hsu 10/08/2012  . Fibroid    fibroid tumor  . Fracture    radial/ulnar 03/2016  . GERD (gastroesophageal reflux disease)   . Heart murmur    innocent murmur  . High cholesterol   . Hypertension   . Hypothyroid, s/p radioactive iodine tx for graves 2001 08/24/2012  . Intestinal metaplasia of gastric mucosa   . Kidney disease   . PD (Parkinson's disease) ([place]) 08/08/2016  . Renal cancer ([place])   . UTI (urinary tract infection)   . Weight loss    eval with GI in 2016; s/p CT/MRI and PET scans    PAST SURGICAL HISTORY:   Past Surgical History:  Procedure Laterality Date  . ABDOMINAL HYSTERECTOMY  1989  . CATARACT EXTRACTION, BILATERAL    . COLONOSCOPY    . DG BARIUM SWALLOW ([place] HX)     9 yrs ago per pt due to incomplete colonsocopy    SOCIAL HISTORY:   Social History   Socioeconomic History  . Marital status: Single    Spouse name: Not on file  . Number of children: Not on file  . Years of education: Not on file  . Highest education level: Not on file  Occupational History  . 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 Financial resource strain: Not on file  . Food insecurity:    Worry: Not on file    Inability:  Not on file  . Transportation needs:    Medical: Not on file    Non-medical: Not on file  Tobacco Use  . Smoking status: Never Smoker  . Smokeless tobacco: Never Used  Substance and Sexual Activity  . Alcohol use: No    Alcohol/week: 0.0 standard drinks  . Drug use: No  . Sexual activity: Not on file  Lifestyle  . Physical activity:    Days per week: Not on file    Minutes per session: Not on file  . Stress: Not on file  Relationships  . Social connections:    Talks on phone: Not on file    Gets together: Not on file    Attends religious service: Not on file    Active member of club or organization: Not on file    Attends meetings of clubs or organizations: Not on file    Relationship status: Not on file  . Intimate partner violence:    Fear of current or ex partner: Not on file    Emotionally abused: Not on file    Physically abused: Not on file    Forced sexual activity: Not on file  Other Topics Concern  . 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 Mother  Deceased  . Father  Deceased  . MGM  Deceased  . MGF  Deceased  .  PGM  Deceased  . PGF  Deceased  . Brother  Deceased  . Sister  Alive  . Sister  Deceased  . Sister  Alive  . Neg Hx  (Not Specified)    ROS:  Review of Systems  Constitutional: Negative.   HENT: Negative.   Eyes: Negative.   Respiratory: Negative.   Cardiovascular: Negative.   Gastrointestinal: Positive for constipation.  Skin: Negative.   Endo/Heme/Allergies: Negative.     PHYSICAL EXAMINATION:    VITALS:   Vitals:   10/24/18 0942    BP: (!) 142/78  Pulse: 64  Weight: 141 lb (64 kg)  Height: 5\' 6"  (1.676 m)   Wt Readings from Last 3 Encounters:  10/24/18 141 lb (64 kg)  09/25/18 142 lb 6.4 oz (64.6 kg)  09/11/18 143 lb 11.2 oz (65.2 kg)    GEN:  The patient appears stated age and is in NAD. HEENT:  Normocephalic, atraumatic.  The mucous membranes are moist. The superficial temporal arteries are without ropiness or tenderness. CV:  RRR Lungs:  CTAB Neck/HEME:  There are no carotid bruits bilaterally.  Neurological examination:  Orientation: The patient is alert and oriented x3. Cranial nerves: There is good facial symmetry. The speech is fluent and clear but is pseudobulbar in quality.  EOMI.  VFF.   Soft palate rises symmetrically and there is no tongue deviation. Hearing is intact to conversational tone. Sensation: Sensation is intact to light touch throughout Motor: Strength is 5/5 in the bilateral upper and lower extremities.   Shoulder shrug is equal and symmetric.  There is no pronator drift.   Movement examination: Tone: There is mild increased tone in the bilateral UE Abnormal movements: There is no tremor Coordination:  There is decremation, with any form of RAMS, including alternating supination and pronation of the forearm, hand opening and closing, finger taps, heel taps and toe taps bilaterally, L more than R. Gait and Station: The patient has no difficulty arising out of a deep-seated chair without the use of the hands. The patient's stride length is normal with good armswing.  She slightly drags the L leg with ambulation  LABS    Chemistry      Component Value Date/Time   NA 145 09/11/2018 1419   NA 142 04/27/2017   K 4.3 09/11/2018 1419   CL 107 09/11/2018 1419   CO2 32 09/11/2018 1419   BUN 27 (H) 09/11/2018 1419   BUN 23 (A) 04/27/2017   CREATININE 1.35 (H) 09/11/2018 1419   GLU 154 04/27/2017      Component Value Date/Time   CALCIUM 9.7 09/11/2018 1419   ALKPHOS 55 04/27/2017    AST 14 04/27/2017   ALT 10 04/27/2017   BILITOT 0.6 06/03/2016 1444     Lab Results  Component Value Date   TSH 0.30 (A) 04/27/2017   Lab Results  Component Value Date   [id number] 034 03/22/2018     ASSESSMENT/PLAN:  1.  Parkinsonism.  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  Regardless, levodopa seems to be working.  -increase carbidopa/levodopa 25/100, 2 po tid.  Risks, benefits, side effects and alternative therapies were discussed.  The opportunity to ask questions was given and they were answered to the best of my ability.  The patient expressed understanding and willingness to follow the outlined treatment protocols.  -she is back to [facility] cycle.  Discussed importance of continuing.  She enjoys the class  2.  Dysphagia  -She had a modified barium swallow on 10/13/2016 that demonstrated mild pharyngeal phase dysphagia.  Regular diet with thin liquids was recommended.  3.  Low blood pressure  -Much better after blood pressure medications were discontinued.  4.  RBD  -watching closely. She doesn't want more medication but was agreeable to trying safety rails and talked to her about buying them on [place]    5. constipation  -rancho recipe given  -hydration encouraged  -miralax and colace recommended  6.  Follow up is anticipated in the next few months, sooner should new neurologic issues arise.  Much greater than 50% of this visit was spent in counseling and coordinating care.  Total face to face time:  25 min.       Cc:  Lucretia Kern, DO

## 2018-10-24 ENCOUNTER — Encounter: Payer: Self-pay | Admitting: Neurology

## 2018-10-24 ENCOUNTER — Ambulatory Visit: Payer: Medicare Other | Admitting: Neurology

## 2018-10-24 VITALS — BP 142/78 | HR 64 | Ht 66.0 in | Wt 141.0 lb

## 2018-10-24 DIAGNOSIS — K5901 Slow transit constipation: Secondary | ICD-10-CM | POA: Diagnosis not present

## 2018-10-24 DIAGNOSIS — G4752 REM sleep behavior disorder: Secondary | ICD-10-CM

## 2018-10-24 DIAGNOSIS — G20 Parkinson's disease: Secondary | ICD-10-CM | POA: Diagnosis not present

## 2018-10-24 NOTE — Patient Instructions (Signed)
Increase carbidopa/levodopa 25/100 to 2 tablets three times per day  Constipation and Parkinson's disease:  1.Rancho recipe for constipation in Parkinsons Disease:  -1 cup of unprocessed bran (need to get this at [employer], [employer] or similar type of store), 2 cups of applesauce in 1 cup of prune juice 2.  Increase fiber intake (Metamucil,vegetables) 3.  Regular, moderate exercise can be beneficial. 4.  Avoid medications causing constipation, such as medications like antacids with calcium or magnesium 5.  Laxative overuse should be avoided. 6.  Stool softeners (Colace) can help with chronic constipation. 7.  Increase water intake.  You should be drinking 1/2 gallon of water a day as long as you have not been diagnosed with congestive heart failure or renal/kidney failure.  This is probably the single greatest thing that you can do to help your constipation.

## 2018-10-30 ENCOUNTER — Ambulatory Visit: Payer: Medicare Other | Admitting: Podiatry

## 2018-10-30 ENCOUNTER — Encounter: Payer: Self-pay | Admitting: Podiatry

## 2018-10-30 VITALS — BP 134/61

## 2018-10-30 DIAGNOSIS — B351 Tinea unguium: Secondary | ICD-10-CM

## 2018-10-30 DIAGNOSIS — E119 Type 2 diabetes mellitus without complications: Secondary | ICD-10-CM | POA: Diagnosis not present

## 2018-10-30 DIAGNOSIS — M79675 Pain in left toe(s): Secondary | ICD-10-CM | POA: Diagnosis not present

## 2018-10-30 DIAGNOSIS — M79674 Pain in right toe(s): Secondary | ICD-10-CM | POA: Diagnosis not present

## 2018-10-30 NOTE — Patient Instructions (Signed)
Diabetes Mellitus and Foot Care Foot care is an important part of your health, especially when you have diabetes. Diabetes may cause you to have problems because of poor blood flow (circulation) to your feet and legs, which can cause your skin to:  Become thinner and drier.  Break more easily.  Heal more slowly.  Peel and crack. You may also have nerve damage (neuropathy) in your legs and feet, causing decreased feeling in them. This means that you may not notice minor injuries to your feet that could lead to more serious problems. Noticing and addressing any potential problems early is the best way to prevent future foot problems. How to care for your feet Foot hygiene  Wash your feet daily with warm water and mild soap. Do not use hot water. Then, pat your feet and the areas between your toes until they are completely dry. Do not soak your feet as this can dry your skin.  Trim your toenails straight across. Do not dig under them or around the cuticle. File the edges of your nails with an emery board or nail file.  Apply a moisturizing lotion or petroleum jelly to the skin on your feet and to dry, brittle toenails. Use lotion that does not contain alcohol and is unscented. Do not apply lotion between your toes. Shoes and socks  Wear clean socks or stockings every day. Make sure they are not too tight. Do not wear knee-high stockings since they may decrease blood flow to your legs.  Wear shoes that fit properly and have enough cushioning. Always look in your shoes before you put them on to be sure there are no objects inside.  To break in new shoes, wear them for just a few hours a day. This prevents injuries on your feet. Wounds, scrapes, corns, and calluses  Check your feet daily for blisters, cuts, bruises, sores, and redness. If you cannot see the bottom of your feet, use a mirror or ask someone for help.  Do not cut corns or calluses or try to remove them with medicine.  If you  find a minor scrape, cut, or break in the skin on your feet, keep it and the skin around it clean and dry. You may clean these areas with mild soap and water. Do not clean the area with peroxide, alcohol, or iodine.  If you have a wound, scrape, corn, or callus on your foot, look at it several times a day to make sure it is healing and not infected. Check for: ? Redness, swelling, or pain. ? Fluid or blood. ? Warmth. ? Pus or a bad smell. General instructions  Do not cross your legs. This may decrease blood flow to your feet.  Do not use heating pads or hot water bottles on your feet. They may burn your skin. If you have lost feeling in your feet or legs, you may not know this is happening until it is too late.  Protect your feet from hot and cold by wearing shoes, such as at the beach or on hot pavement.  Schedule a complete foot exam at least once a year (annually) or more often if you have foot problems. If you have foot problems, report any cuts, sores, or bruises to your health care provider immediately. Contact a health care provider if:  You have a medical condition that increases your risk of infection and you have any cuts, sores, or bruises on your feet.  You have an injury that is not   healing.  You have redness on your legs or feet.  You feel burning or tingling in your legs or feet.  You have pain or cramps in your legs and feet.  Your legs or feet are numb.  Your feet always feel cold.  You have pain around a toenail. Get help right away if:  You have a wound, scrape, corn, or callus on your foot and: ? You have pain, swelling, or redness that gets worse. ? You have fluid or blood coming from the wound, scrape, corn, or callus. ? Your wound, scrape, corn, or callus feels warm to the touch. ? You have pus or a bad smell coming from the wound, scrape, corn, or callus. ? You have a fever. ? You have a red line going up your leg. Summary  Check your feet every day  for cuts, sores, red spots, swelling, and blisters.  Moisturize feet and legs daily.  Wear shoes that fit properly and have enough cushioning.  If you have foot problems, report any cuts, sores, or bruises to your health care provider immediately.  Schedule a complete foot exam at least once a year (annually) or more often if you have foot problems. This information is not intended to replace advice given to you by your health care provider. Make sure you discuss any questions you have with your health care provider. Document Released: 09/16/2000 Document Revised: 11/01/2017 Document Reviewed: 10/21/2016 Elsevier Interactive Patient Education  2019 [employer]  Onychomycosis/Fungal Toenails  WHAT IS IT? An infection that lies within the keratin of your nail plate that is caused by a fungus.  WHY ME? Fungal infections affect all ages, sexes, races, and creeds.  There may be many factors that predispose you to a fungal infection such as age, coexisting medical conditions such as diabetes, or an autoimmune disease; stress, medications, fatigue, genetics, etc.  Bottom line: fungus thrives in a warm, moist environment and your shoes offer such a location.  IS IT CONTAGIOUS? Theoretically, yes.  You do not want to share shoes, nail clippers or files with someone who has fungal toenails.  Walking around barefoot in the same room or sleeping in the same bed is unlikely to transfer the organism.  It is important to realize, however, that fungus can spread easily from one nail to the next on the same foot.  HOW DO WE TREAT THIS?  There are several ways to treat this condition.  Treatment may depend on many factors such as age, medications, pregnancy, liver and kidney conditions, etc.  It is best to ask your doctor which options are available to you.  1. No treatment.   Unlike many other medical concerns, you can live with this condition.  However for many people this can be a painful condition and  may lead to ingrown toenails or a bacterial infection.  It is recommended that you keep the nails cut short to help reduce the amount of fungal nail. 2. Topical treatment.  These range from herbal remedies to prescription strength nail lacquers.  About 40-50% effective, topicals require twice daily application for approximately 9 to 12 months or until an entirely new nail has grown out.  The most effective topicals are medical grade medications available through physicians offices. 3. Oral antifungal medications.  With an 80-90% cure rate, the most common oral medication requires 3 to 4 months of therapy and stays in your system for a year as the new nail grows out.  Oral antifungal medications do require   blood work to make sure it is a safe drug for you.  A liver function panel will be performed prior to starting the medication and after the first month of treatment.  It is important to have the blood work performed to avoid any harmful side effects.  In general, this medication safe but blood work is required. 4. Laser Therapy.  This treatment is performed by applying a specialized laser to the affected nail plate.  This therapy is noninvasive, fast, and non-painful.  It is not covered by insurance and is therefore, out of pocket.  The results have been very good with a 80-95% cure rate.  [facility] is the only practice in the area to offer this therapy. 5. Permanent Nail Avulsion.  Removing the entire nail so that a new nail will not grow back. 

## 2018-10-31 MED ORDER — NONFORMULARY OR COMPOUNDED ITEM: 5 refills | Status: DC

## 2018-11-09 NOTE — Progress Notes (Signed)
Subjective: Pamela Patton presents today referred by Lucretia Kern, DO for diabetic foot evaluation. She presents with h/o diabetes, diagnosed in 68.  She c/o  painful, discolored, thick toenails which interfere with daily activities.  Pain is aggravated when wearing enclosed shoe gear.   She denies any h/o foot wounds. She denies any pedal numbness or tingling.  She does relate occasional burning between 4th and 5th digits left foot.  Past Medical History:  Diagnosis Date  . Anemia   . Arthritis   . Cataract    immature   . Chest pain summer 2012  . Chicken pox   . Diverticulosis    difficulty with colonoscopy 2005  . 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 Elyse Hsu 10/08/2012  . Fibroid    fibroid tumor  . Fracture    radial/ulnar 03/2016  . GERD (gastroesophageal reflux disease)   . Heart murmur    innocent murmur  . High cholesterol   . Hypertension   . Hypothyroid, s/p radioactive iodine tx for graves 2001 08/24/2012  . Intestinal metaplasia of gastric mucosa   . Kidney disease   . PD (Parkinson's disease) ([place]) 08/08/2016  . Renal cancer ([place])   . UTI (urinary tract infection)   . Weight loss    eval with GI in 2016; s/p CT/MRI and PET scans    Patient Active Problem List   Diagnosis Date Noted  . Encounter for long-term (current) use of medications 02/13/2018  . 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 Carotid artery disease ([place]) 03/21/2017  . Hypertension associated with diabetes ([place]) 02/22/2017  . Hyperlipidemia associated with type 2 diabetes mellitus ([place]) 02/22/2017  . Hypothyroidism, postablative 10/31/2016  . Hyperuricemia 10/31/2016  . PD (Parkinson's disease) ([place]) 08/08/2016  . Loss of weight 07/25/2016  . 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 Elyse Hsu 10/08/2012  . CKD (chronic kidney disease) stage 3, GFR 30-59 ml/min ([facility]) 10/08/2012  . Hypothyroid,  s/p radioactive iodine tx for graves 2001 - managed by endocrinologist, Dr. Elyse Hsu 08/24/2012  . Vitamin D deficiency 08/24/2012    Past Surgical History:  Procedure Laterality Date  . ABDOMINAL HYSTERECTOMY  1989  . CATARACT EXTRACTION, BILATERAL    . COLONOSCOPY    . DG BARIUM SWALLOW ([place] [facility]X)     9 yrs ago per pt due to incomplete colonsocopy   Medications reviewed  No Known Allergies  Social History   Occupational History  . 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 Smoking status: Never Smoker  . Smokeless tobacco: Never Used  Substance and Sexual Activity  . Alcohol use: No    Alcohol/week: 0.0 standard drinks  . Drug use: No  . Sexual activity: Not on file    Family History  Problem Relation Age of Onset  . Cancer Mother        liver  . Liver cancer Mother   . Stroke Father 75  . Diabetes Father   . Hypertension Father   . Diabetes Maternal Grandmother   . Prostate cancer Maternal Grandfather   . Pancreatic cancer Brother   . Diabetes Sister   . Cancer Sister   . Diabetes Sister   . Colon polyps Neg Hx   . Colon cancer Neg Hx   . Esophageal cancer Neg Hx   . Rectal cancer Neg Hx   . Stomach cancer Neg Hx  Immunization History  Administered Date(s) Administered  . Influenza Whole 07/03/2012  . Influenza-Unspecified 07/04/2015, 07/04/2016, 07/03/2017, 08/21/2018  . Pneumococcal Conjugate-13 06/17/2014  . Pneumococcal Polysaccharide-23 10/03/2009, 02/01/2011  . Tdap 03/03/2009, 03/12/2016  . Zoster 01/31/2010     Review of systems: Positive Findings in bold print.  Constitutional:  chills, fatigue, fever, sweats, weight change Communication: Optometrist, sign Ecologist, hand writing, iPad/Android device Head: headaches, head injury Eyes: changes in vision, eye pain, glaucoma, cataracts, macular degeneration, diplopia, glare,  light sensitivity, eyeglasses or contacts,  blindness Ears nose mouth throat: Hard of hearing, ringing in ears, deaf, sign language,  vertigo,   nosebleeds,  rhinitis,  cold sores, snoring, swollen glands Cardiovascular: HTN, edema, arrhythmia, pacemaker in place, defibrillator in place,  chest pain/tightness, chronic anticoagulation, blood clot, heart failure Peripheral Vascular: leg cramps, varicose veins, blood clots, lymphedema Respiratory:  difficulty breathing, denies congestion, SOB, wheezing, cough, emphysema Gastrointestinal: change in appetite or weight, abdominal pain, constipation, diarrhea, nausea, vomiting, vomiting blood, change in bowel habits, abdominal pain, jaundice, rectal bleeding, hemorrhoids, Genitourinary:  nocturia,  pain on urination,  blood in urine, Foley catheter, urinary urgency Musculoskeletal: uses mobility aid,  cramping, stiff joints, painful joints, decreased joint motion, fractures, OA, gout Skin: +changes in toenails, color change, dryness, itching, mole changes,  rash  Neurological: headaches, numbness in feet, paresthesias in feet, burning in feet, fainting,  seizures, change in speech. denies headaches, memory problems/poor historian, cerebral palsy, weakness, paralysis, tremors Endocrine: diabetes, hypothyroidism, hyperthyroidism,  goiter, dry mouth, flushing, heat intolerance,  cold intolerance,  excessive thirst, denies polyuria,  nocturia Hematological:  easy bleeding, excessive bleeding, easy bruising, enlarged lymph nodes, on long term blood thinner, history of past transusions Allergy/immunological:  hives, eczema, frequent infections, multiple drug allergies, seasonal allergies, transplant recipient Psychiatric:  anxiety, depression, mood disorder, suicidal ideations, hallucinations   Objective: Vascular Examination: Capillary refill time immediate x 10 digits Dorsalis pedis and posterior tibial pulses present b/l No digital hair x 10 digits Skin temperature gradient WNL b/l  Dermatological  Examination: Skin with normal turgor, texture and tone b/l  Toenails 1-5 b/l discolored, thick, dystrophic with subungual debris and pain with palpation to nailbeds due to thickness of nails.  Musculoskeletal: Muscle strength 5/5 to all LE muscle groups  Neurological: Sensation intact with 10 gram monofilament Vibratory sensation intact.  Assessment: 1. Painful onychomycosis toenails 1-5 b/l  2. NIDDM  Plan: 1. Discussed diabetic foot care principles. Literature dispensed on today. Toenails 1-5 b/l were debrided in length and girth without iatrogenic bleeding.  2. Compounded Toenail Antifungal will be ordered and mailed to patient's home from [facility]. The medication consist of terbinafine 3%, fluconazole 2%, tea tree oil 5%, urea 10%, ibuprofen 2% and DMSO suspension. 3. Patient to continue soft, supportive shoe gear. 4. Patient to report any pedal injuries to medical professional immediately. 5. Follow up 3 months.  6. Patient/POA to call should there be a concern in the interim.

## 2019-01-14 ENCOUNTER — Ambulatory Visit (INDEPENDENT_AMBULATORY_CARE_PROVIDER_SITE_OTHER): Payer: Medicare Other | Admitting: Family Medicine

## 2019-01-14 ENCOUNTER — Other Ambulatory Visit: Payer: Self-pay

## 2019-01-14 DIAGNOSIS — E890 Postprocedural hypothyroidism: Secondary | ICD-10-CM | POA: Diagnosis not present

## 2019-01-14 DIAGNOSIS — E1169 Type 2 diabetes mellitus with other specified complication: Secondary | ICD-10-CM | POA: Diagnosis not present

## 2019-01-14 DIAGNOSIS — E785 Hyperlipidemia, unspecified: Secondary | ICD-10-CM

## 2019-01-14 DIAGNOSIS — E1159 Type 2 diabetes mellitus with other circulatory complications: Secondary | ICD-10-CM

## 2019-01-14 DIAGNOSIS — Z794 Long term (current) use of insulin: Secondary | ICD-10-CM

## 2019-01-14 DIAGNOSIS — I10 Essential (primary) hypertension: Secondary | ICD-10-CM

## 2019-01-14 DIAGNOSIS — E1129 Type 2 diabetes mellitus with other diabetic kidney complication: Secondary | ICD-10-CM

## 2019-01-14 DIAGNOSIS — G20 Parkinson's disease: Secondary | ICD-10-CM

## 2019-01-14 DIAGNOSIS — I152 Hypertension secondary to endocrine disorders: Secondary | ICD-10-CM

## 2019-01-14 NOTE — Progress Notes (Signed)
Virtual Visit via Video Note  I connected with Pamela Patton  on 01/14/19 at  8:45 AM EDT by a video enabled telemedicine application and verified that I am speaking with the correct person using two identifiers. She was not able to use the video component of the platform, but visit was completed successfully audio.  Location patient: home Location provider:work or home office Persons participating in the virtual visit: patient, provider  I discussed the limitations of evaluation and management by telemedicine and the availability of in person appointments. The patient expressed understanding and agreed to proceed.   HPI:  Pamela Patton is a pleasant 73 y.o. here for follow up. Chronic medical problems summarized below were reviewed for changes and stability and were updated as needed below. These issues and their treatment remain stable for the most part.   She has a blood pressure cuff at home. Thinks blood pressure has been ok. She had a BP check recently with Dr. Elyse Hsu and it was ok.  She misses cycling class. Is doing exercises at home. She checks her blood sugars on a regular basis and usually around 120. Denies CP, SOB, dizziness, low bs, DOE, treatment intolerance or new symptoms.  AWV 05/2018  HTN: -meds:Aspirin,losartan 50, hctz 25 -had negative cardiology eval, EKG and trop for CP in 11/2017 ER visit -sees cardiologist for HTN and ? Carotid art dz - but neg carotid duplex in 2018 per cardiology notes "Essentially normal study. Repeat when clinically indicated." Dr. Gwenlyn Found  Diabetes w/ CKD: -meds: humalog, lantus, metformin, pioglitazone, asa, arb -sees Dr. Elyse Hsu for management  Hypothyroidism: -meds: levothyroxine -sees Dr. Elyse Hsu for management  HLD: -meds: atorvastatin  Parkinson Dz: -sees Dr. Carles Collet for management -meds: sinemet  Hx of Anemia: -neg eval iron studies, b12 and folate with GI in 6/19 and they advised EGD, but doubted GI source -advised seeing  Dr. Louis Meckel as well whom follows her for her renal mass and CKD  Hx CKD/Renal mass: -sees Dr. Louis Meckel for managment   ROS: See pertinent positives and negatives per HPI.  Past Medical History:  Diagnosis Date  . Anemia   . Arthritis   . Cataract    immature   . Chest pain summer 2012  . Chicken pox   . Diverticulosis    difficulty with colonoscopy 2005  . 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 Elyse Hsu 10/08/2012  . Fibroid    fibroid tumor  . Fracture    radial/ulnar 03/2016  . GERD (gastroesophageal reflux disease)   . Heart murmur    innocent murmur  . High cholesterol   . Hypertension   . Hypothyroid, s/p radioactive iodine tx for graves 2001 08/24/2012  . Intestinal metaplasia of gastric mucosa   . Kidney disease   . PD (Parkinson's disease) ([place]) 08/08/2016  . Renal cancer ([place])   . UTI (urinary tract infection)   . Weight loss    eval with GI in 2016; s/p CT/MRI and PET scans    Past Surgical History:  Procedure Laterality Date  . ABDOMINAL HYSTERECTOMY  1989  . CATARACT EXTRACTION, BILATERAL    . COLONOSCOPY    . DG BARIUM SWALLOW ([place] HX)     9 yrs ago per pt due to incomplete colonsocopy    Family History  Problem Relation Age of Onset  . Cancer Mother        liver  . Liver cancer Mother   . Stroke Father 31  . Diabetes  Father   . Hypertension Father   . Diabetes Maternal Grandmother   . Prostate cancer Maternal Grandfather   . Pancreatic cancer Brother   . Diabetes Sister   . Cancer Sister   . Diabetes Sister   . Colon polyps Neg Hx   . Colon cancer Neg Hx   . Esophageal cancer Neg Hx   . Rectal cancer Neg Hx   . Stomach cancer Neg Hx     SOCIAL HX: see hpi   Current Outpatient Medications:  .  aspirin 81 MG tablet, Take 81 mg by mouth daily with lunch. , Disp: , Rfl:  .  atorvastatin (LIPITOR) 40 MG tablet, Take 40 mg by mouth daily with lunch. , Disp: , Rfl:  .  Calcium  Carbonate-Vitamin D (CALTRATE 600+D) 600-400 MG-UNIT per tablet, Take 1 tablet by mouth 3 (three) times a week. , Disp: , Rfl:  .  carbidopa-levodopa (SINEMET IR) 25-100 MG tablet, TAKE 1 & 1/2 TABLETS BY MOUTH 3 TIMES DAILY, Disp: 405 tablet, Rfl: 1 .  Cholecalciferol (VITAMIN D PO), Take 5,000 Units by mouth 3 (three) times a week. , Disp: , Rfl:  .  Cyanocobalamin (VITAMIN B12 PO), Take 1 tablet by mouth daily., Disp: , Rfl:  .  glucose 4 GM chewable tablet, Chew 1 tablet by mouth as needed for low blood sugar., Disp: , Rfl:  .  HUMALOG KWIKPEN 100 UNIT/ML KiwkPen, Inject 0-4 Units into the skin 3 (three) times daily with meals. , Disp: , Rfl:  .  hydrochlorothiazide (HYDRODIURIL) 25 MG tablet, Take 25 mg by mouth daily with lunch. , Disp: , Rfl:  .  insulin glargine (LANTUS) 100 UNIT/ML injection, Inject 15 Units into the skin every evening. , Disp: , Rfl:  .  levothyroxine (SYNTHROID, LEVOTHROID) 75 MCG tablet, Take 75 mcg by mouth daily., Disp: , Rfl:  .  losartan (COZAAR) 25 MG tablet, Take 2 tablets (50 mg total) by mouth daily., Disp: 180 tablet, Rfl: 3 .  METFORMIN HCL PO, Take 500 mg by mouth 2 (two) times daily. , Disp: , Rfl:  .  NONFORMULARY OR COMPOUNDED ITEM, [facility]:  Antifungal - Terbinafine 3%, Fluconazole 2%, Tea Tree Oil 5%, Urea 10%, Ibuprofen 2% in DMSO Suspension #69ml. Apply to the affected nail(s) once (at bedtime) or twice daily., Disp: 30 each, Rfl: 5 .  Nutritional Supplements (FEEDING SUPPLEMENT, GLUCERNA 1.2 CAL,) LIQD, Place 237 mLs into feeding tube daily with breakfast., Disp: , Rfl:  .  Pioglitazone HCl (ACTOS PO), Take 45 mg by mouth daily with lunch. , Disp: , Rfl:  .  Polyethyl Glycol-Propyl Glycol (SYSTANE) 0.4-0.3 % GEL ophthalmic gel, Place 1 application into both eyes at bedtime as needed (FOR EYES)., Disp: , Rfl:   Current Facility-Administered Medications:  .  0.9 %  sodium chloride infusion, 500 mL, Intravenous, Continuous, Armbruster, Carlota Raspberry, MD  EXAM:  VITALS per patient if applicable: none  GENERAL: alert, oriented, no audible acute distress  LUNGS: no audible signs of respiratory distress, no obvious gross SOB, gasping or wheezing  PSYCH/NEURO: pleasant and cooperative, no obvious depression or anxiety, speech and thought processing grossly intact - slow speech but clear  ASSESSMENT AND PLAN:  Discussed the following assessment and plan:  Hypertension associated with diabetes ([place])  Hyperlipidemia associated with type 2 diabetes mellitus ([facility])  Hypothyroidism, postablative  PD (Parkinson's disease) ([place])  Type 2 diabetes mellitus with other diabetic kidney complication, with long-term current use of insulin ([place])  Encouraged  and congratulated on continued exercise during Pandemic. Discussed social distancing. She will continue to monitor BS and BP at home. She has a  TOC visit with Dr. Ethlyn Gallery set up.   I discussed the assessment and treatment plan with the patient. The patient was provided an opportunity to ask questions and all were answered. The patient agreed with the plan and demonstrated an understanding of the instructions.   The patient was advised to call back or seek an in-person evaluation if the symptoms worsen or if the condition fails to improve as anticipated.   Lucretia Kern, DO

## 2019-01-29 ENCOUNTER — Ambulatory Visit: Payer: Medicare Other | Admitting: Podiatry

## 2019-02-06 ENCOUNTER — Other Ambulatory Visit: Payer: Self-pay | Admitting: Neurology

## 2019-02-12 NOTE — Progress Notes (Signed)
Virtual Visit via Video Note The purpose of this virtual visit is to provide medical care while limiting exposure to the novel coronavirus.    Consent was obtained for video visit:  Yes.   Answered questions that patient had about telehealth interaction:  Yes.   I discussed the limitations, risks, security and privacy concerns of performing an evaluation and management service by telemedicine. I also discussed with the patient that there may be a patient responsible charge related to this service. The patient expressed understanding and agreed to proceed.  Pt location: Home Physician Location: office Name of referring provider:  Lucretia Kern, DO I connected with Pamela Patton at patients initiation/request on 02/15/2019 at  9:00 AM EDT by video enabled telemedicine application and verified that I am speaking with the correct person using two identifiers. Pt MRN:  [id number] Pt DOB:  [birth year] Video Participants:  Pamela Patton;     History of Present Illness:  Patient seen today in follow-up for parkinsonism.  She is on carbidopa/levodopa 25/100, 2 tablets 3 times per day (hasn't gotten any in yet today).  Pt denies falls.  Pt denies lightheadedness, near syncope.  No hallucinations.  Mood has been good.  No swallowing difficulties.  Medical records have been reviewed since last visit.  She last saw her primary care physician on April 13.  No medication changes were made.  [facility]'t go to [facility] so she is now using lightweights at home.  She also walks around her town[place] community x 30 min.  Bowels are moving better now that she is drinking water better.   Observations/Objective:   Vitals:   02/15/19 0843  Weight: 143 lb (64.9 kg)  Height: 5' 6.5" (1.689 m)   GEN:  The patient appears stated age and is in NAD.  Neurological examination:  Orientation: The patient is alert and oriented x3. Cranial nerves: There is good facial symmetry. There is mild facial hypomimia.  The speech  is fluent and clear with pseudobulbar quality. Soft palate rises symmetrically and there is no tongue deviation. Hearing is intact to conversational tone. Motor: Strength is at least antigravity x 4.   Shoulder shrug is equal and symmetric.  There is no pronator drift.  Movement examination: Tone: unable Abnormal movements: None Coordination:  There is no decremation with RAM's, with any form of RAMS, including alternating supination and pronation of the forearm, hand opening and closing, finger taps on the left Gait and Station: The patient pushes off of the chair to arise. The patient's stride length is good.      Assessment and Plan:   1.  Parkinsonism.  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  Regardless, levodopa seems to be working.             -contine carbidopa/levodopa 25/100, 2 po tid.  Risks, benefits, side effects and alternative therapies were discussed.  The opportunity to ask questions was given and they were answered to the best of my ability.  The patient expressed understanding and willingness to follow the outlined treatment protocols.             -talked about online programs for exercise and mental health.   2.  Dysphagia             -She had a modified barium swallow on 10/13/2016 that demonstrated mild pharyngeal phase dysphagia.  Regular diet with thin liquids was recommended.  3.  Low blood pressure             -Much better after blood pressure medications were discontinued.  4.  RBD             -watching closely. She doesn't want more medication but was agreeable to trying safety rails and talked to her about [place]    5. constipation             -better with hydration and improved diet  Follow Up Instructions:  f/u 4 months  -I discussed the assessment and treatment plan with the patient. The patient was provided an opportunity to ask questions and all were  answered. The patient agreed with the plan and demonstrated an understanding of the instructions.   The patient was advised to call back or seek an in-person evaluation if the symptoms worsen or if the condition fails to improve as anticipated.    Total Time spent in visit with the patient was:  15 min, of which more than 50% of the time was spent in counseling and/or coordinating care on safety and importance of exercise.   Pt understands and agrees with the plan of care outlined.     [name], DO

## 2019-02-15 ENCOUNTER — Encounter: Payer: Self-pay | Admitting: Neurology

## 2019-02-15 ENCOUNTER — Telehealth (INDEPENDENT_AMBULATORY_CARE_PROVIDER_SITE_OTHER): Payer: Medicare Other | Admitting: Neurology

## 2019-02-15 ENCOUNTER — Other Ambulatory Visit: Payer: Self-pay

## 2019-02-15 DIAGNOSIS — G20 Parkinson's disease: Secondary | ICD-10-CM

## 2019-02-26 ENCOUNTER — Ambulatory Visit: Payer: Medicare Other | Admitting: Neurology

## 2019-03-27 ENCOUNTER — Other Ambulatory Visit: Payer: Self-pay | Admitting: Pediatric Intensive Care

## 2019-03-27 DIAGNOSIS — Z20822 Contact with and (suspected) exposure to covid-19: Secondary | ICD-10-CM

## 2019-03-30 LAB — NOVEL CORONAVIRUS, NAA: SARS-CoV-2, NAA: NOT DETECTED

## 2019-04-03 ENCOUNTER — Other Ambulatory Visit: Payer: Self-pay | Admitting: Urology

## 2019-04-03 ENCOUNTER — Other Ambulatory Visit (HOSPITAL_COMMUNITY): Payer: Self-pay | Admitting: Urology

## 2019-04-03 DIAGNOSIS — D3000 Benign neoplasm of unspecified kidney: Secondary | ICD-10-CM

## 2019-04-24 ENCOUNTER — Encounter: Payer: Self-pay | Admitting: Family Medicine

## 2019-04-24 ENCOUNTER — Other Ambulatory Visit: Payer: Self-pay

## 2019-04-24 ENCOUNTER — Ambulatory Visit (INDEPENDENT_AMBULATORY_CARE_PROVIDER_SITE_OTHER): Payer: Medicare Other | Admitting: Family Medicine

## 2019-04-24 DIAGNOSIS — E785 Hyperlipidemia, unspecified: Secondary | ICD-10-CM

## 2019-04-24 DIAGNOSIS — E1169 Type 2 diabetes mellitus with other specified complication: Secondary | ICD-10-CM

## 2019-04-24 DIAGNOSIS — I10 Essential (primary) hypertension: Secondary | ICD-10-CM

## 2019-04-24 DIAGNOSIS — G20 Parkinson's disease: Secondary | ICD-10-CM | POA: Diagnosis not present

## 2019-04-24 DIAGNOSIS — Z794 Long term (current) use of insulin: Secondary | ICD-10-CM

## 2019-04-24 DIAGNOSIS — E1159 Type 2 diabetes mellitus with other circulatory complications: Secondary | ICD-10-CM | POA: Diagnosis not present

## 2019-04-24 DIAGNOSIS — E890 Postprocedural hypothyroidism: Secondary | ICD-10-CM | POA: Diagnosis not present

## 2019-04-24 DIAGNOSIS — E1129 Type 2 diabetes mellitus with other diabetic kidney complication: Secondary | ICD-10-CM

## 2019-04-24 NOTE — Progress Notes (Signed)
Virtual Visit via Video Note  I connected with Pamela Patton   on 04/24/19 at 10:00 AM EDT by a video enabled telemedicine application and verified that I am speaking with the correct person using two identifiers.  Location patient: home Location provider:work office Persons participating in the virtual visit: patient, provider  I discussed the limitations of evaluation and management by telemedicine and the availability of in person appointments. The patient expressed understanding and agreed to proceed.   Pamela Patton DOB: 1946/03/31 Encounter date: 04/24/2019  This is a 73 y.o. female who presents to establish care. Chief Complaint  Patient presents with  . Establish Care    History of present illness: Parkinsons: follows with Dr. Carles Collet. On carbidopa/levodopa 25/100 2 tabs TID.  DMII: following with Dr. Elyse Hsu. Last A1C 5/22 was 8.4. has been checking sugar multiple times daily; working on getting A1C down. Has follow up visit in August.  HTN: medications (atenolol, amlodipine) discontinued due to hypotension. She is still taking hctz and losartan 50mg . Hasn't been checking at home regularly. Not sure where home cuff is now.  Hypothyroid: follows with Dr. Elyse Hsu. On levothyroxine. CKD/Renal mass: follows with Dr. Louis Meckel. She has follow up appointment in August. She is due for repeat imaging which is scheduled already.  HL: lipitor 40mg  daily. Lipids stable when checked in may (in care everywhere; reviewed today).  She is pretty much in isolation in home. Most of family is away in other states. Does talk to people regularly. Trying to exercise daily. Too hot to walk outside now. Does chair exercises; walks in house. Feels like anxiety is getting to her with state of world. Turns off tv because feels like there is nothing much good going on in world. Just hasn't felt quite this much stress before. Little problems make her feel much more stressed/anxious now.   Has 2 sisters, son  that are good support system for her. Also has a lot of folks that she talks with through day. Also able to meet with church friend while distanced the other day.     Past Medical History:  Diagnosis Date  . Anemia   . Arthritis   . Cataract    immature   . Chest pain summer 2012  . Chicken pox   . Diverticulosis    difficulty with colonoscopy 2005  . 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 Elyse Hsu 10/08/2012  . Fibroid    fibroid tumor  . Fracture    radial/ulnar 03/2016  . GERD (gastroesophageal reflux disease)   . Heart murmur    innocent murmur  . High cholesterol   . Hypertension   . Hypothyroid, s/p radioactive iodine tx for graves 2001 08/24/2012  . Intestinal metaplasia of gastric mucosa   . Kidney disease   . PD (Parkinson's disease) ([place]) 08/08/2016  . Renal cancer ([place])   . UTI (urinary tract infection)   . Weight loss    eval with GI in 2016; s/p CT/MRI and PET scans   Past Surgical History:  Procedure Laterality Date  . ABDOMINAL HYSTERECTOMY  1989  . CATARACT EXTRACTION, BILATERAL    . COLONOSCOPY    . DG BARIUM SWALLOW ([facility] HX)     9 yrs ago per pt due to incomplete colonsocopy   No Known Allergies Current Meds  Medication Sig  . aspirin 81 MG tablet Take 81 mg by mouth daily with lunch.   Marland Kitchen atorvastatin (LIPITOR) 40 MG tablet Take  40 mg by mouth daily with lunch.   . Calcium Carbonate-Vitamin D (CALTRATE 600+D) 600-400 MG-UNIT per tablet Take 1 tablet by mouth 3 (three) times a week.   . carbidopa-levodopa (SINEMET IR) 25-100 MG tablet TAKE 1 & 1/2 TABLETS BY MOUTH 3 TIMES DAILY  . Cholecalciferol (VITAMIN D PO) Take 5,000 Units by mouth 3 (three) times a week.   . Cyanocobalamin (VITAMIN B12 PO) Take 1 tablet by mouth daily.  Marland Kitchen glucose 4 GM chewable tablet Chew 1 tablet by mouth as needed for low blood sugar.  Marland Kitchen HUMALOG KWIKPEN 100 UNIT/ML KiwkPen Inject 0-4 Units into the skin 3 (three) times daily  with meals.   . hydrochlorothiazide (HYDRODIURIL) 25 MG tablet Take 25 mg by mouth daily with lunch.   . insulin glargine (LANTUS) 100 UNIT/ML injection Inject 15 Units into the skin every evening.   Marland Kitchen levothyroxine (SYNTHROID, LEVOTHROID) 75 MCG tablet Take 75 mcg by mouth daily.  Marland Kitchen losartan (COZAAR) 25 MG tablet Take 2 tablets (50 mg total) by mouth daily.  Marland Kitchen METFORMIN HCL PO Take 500 mg by mouth 2 (two) times daily.   . NONFORMULARY OR COMPOUNDED ITEM [facility]:  Antifungal - Terbinafine 3%, Fluconazole 2%, Tea Tree Oil 5%, Urea 10%, Ibuprofen 2% in DMSO Suspension #70ml. Apply to the affected nail(s) once (at bedtime) or twice daily.  . Nutritional Supplements (FEEDING SUPPLEMENT, GLUCERNA 1.2 CAL,) LIQD Place 237 mLs into feeding tube daily with breakfast.  . Pioglitazone HCl (ACTOS PO) Take 45 mg by mouth daily with lunch.   Vladimir Faster Glycol-Propyl Glycol (SYSTANE) 0.4-0.3 % GEL ophthalmic gel Place 1 application into both eyes at bedtime as needed (FOR EYES).   Current Facility-Administered Medications for the 04/24/19 encounter (Office Visit) with Caren Macadam, MD  Medication  . 0.9 %  sodium chloride infusion   Social History   Tobacco Use  . Smoking status: Never Smoker  . Smokeless tobacco: Never Used  Substance Use Topics  . Alcohol use: No    Alcohol/week: 0.0 standard drinks   Family History  Problem Relation Age of Onset  . Cancer Mother        liver  . Liver cancer Mother   . Stroke Father 18  . Diabetes Father   . Hypertension Father   . Diabetes Maternal Grandmother   . Prostate cancer Maternal Grandfather   . Pancreatic cancer Brother   . Diabetes Sister   . Cancer Sister   . Diabetes Sister   . Colon polyps Neg Hx   . Colon cancer Neg Hx   . Esophageal cancer Neg Hx   . Rectal cancer Neg Hx   . Stomach cancer Neg Hx      Review of Systems  Constitutional: Negative for chills, fatigue and fever.  Respiratory: Negative for cough,  chest tightness, shortness of breath and wheezing.   Cardiovascular: Negative for chest pain, palpitations and leg swelling.    Objective:  There were no vitals taken for this visit.      BP Readings from Last 3 Encounters:  10/30/18 134/61  10/24/18 (!) 142/78  10/04/18 120/70   Wt Readings from Last 3 Encounters:  02/15/19 143 lb (64.9 kg)  10/24/18 141 lb (64 kg)  09/25/18 142 lb 6.4 oz (64.6 kg)    EXAM:  GENERAL: alert, oriented, appears well and in no acute distress  LUNGS: no shortness of breath in conversation on phone  PSYCH/NEURO: pleasant and cooperative, no obvious depression or anxiety, speech  and thought processing grossly intact   Assessment/Plan  1. Hypertension associated with diabetes ([place]) Has been stable. Continue current medications.   2. Hyperlipidemia associated with type 2 diabetes mellitus ([place]) Stable. Continue current medications.  3. Hypothyroidism, postablative Stable. Continue synthroid 66mcg daily  4. PD (Parkinson's disease) [facility]) Following with neurology regularly. Stable.  5. Type 2 diabetes mellitus with other diabetic kidney complication, with long-term current use of insulin ([place]) Follows with endocrinology. Sugars have been stable.  Return AWV August with HK. We did also discuss increased stress with COVID and stresses in country. She will reach out if future concerns. She has good support system and feels she is holding out ok for now.      I discussed the assessment and treatment plan with the patient. The patient was provided an opportunity to ask questions and all were answered. The patient agreed with the plan and demonstrated an understanding of the instructions.   The patient was advised to call back or seek an in-person evaluation if the symptoms worsen or if the condition fails to improve as anticipated.  I provided 25 minutes of non-face-to-face time during this encounter.   Micheline Rough, MD

## 2019-04-25 ENCOUNTER — Telehealth: Payer: Self-pay | Admitting: *Deleted

## 2019-04-25 NOTE — Telephone Encounter (Signed)
-----   Message from Caren Macadam, MD sent at 04/24/2019 10:52 AM EDT ----- AWV with HK in august please :)

## 2019-04-25 NOTE — Telephone Encounter (Signed)
I called the pt and scheduled an appt for 8/18.

## 2019-05-07 ENCOUNTER — Ambulatory Visit: Payer: Medicare Other | Admitting: Podiatry

## 2019-05-13 ENCOUNTER — Ambulatory Visit: Payer: Medicare Other

## 2019-05-21 ENCOUNTER — Ambulatory Visit (INDEPENDENT_AMBULATORY_CARE_PROVIDER_SITE_OTHER): Payer: Medicare Other | Admitting: Family Medicine

## 2019-05-21 ENCOUNTER — Other Ambulatory Visit: Payer: Self-pay

## 2019-05-21 DIAGNOSIS — G20 Parkinson's disease: Secondary | ICD-10-CM

## 2019-05-21 DIAGNOSIS — Z0000 Encounter for general adult medical examination without abnormal findings: Secondary | ICD-10-CM

## 2019-05-21 DIAGNOSIS — Z794 Long term (current) use of insulin: Secondary | ICD-10-CM

## 2019-05-21 DIAGNOSIS — E1159 Type 2 diabetes mellitus with other circulatory complications: Secondary | ICD-10-CM | POA: Diagnosis not present

## 2019-05-21 DIAGNOSIS — E1169 Type 2 diabetes mellitus with other specified complication: Secondary | ICD-10-CM

## 2019-05-21 DIAGNOSIS — N183 Chronic kidney disease, stage 3 unspecified: Secondary | ICD-10-CM

## 2019-05-21 DIAGNOSIS — E1129 Type 2 diabetes mellitus with other diabetic kidney complication: Secondary | ICD-10-CM

## 2019-05-21 DIAGNOSIS — I10 Essential (primary) hypertension: Secondary | ICD-10-CM

## 2019-05-21 DIAGNOSIS — E890 Postprocedural hypothyroidism: Secondary | ICD-10-CM

## 2019-05-21 DIAGNOSIS — E785 Hyperlipidemia, unspecified: Secondary | ICD-10-CM

## 2019-05-21 MED ORDER — BLOOD PRESSURE CUFF MISC: 1.0000 | 0 refills | Status: AC | PRN

## 2019-05-21 NOTE — Patient Instructions (Addendum)
  Pamela Patton , Thank you for taking time to come for your Medicare Wellness Visit. I appreciate your ongoing commitment to your health goals. Please review the following plan we discussed and let me know if I can assist you in the future.   These are the goals we discussed: Goals      General    *please get a blood pressure cuff and monitor the blood pressure - check 3x per week and keep a log to bring to your next virtual follow up appointment.  *Please get your flu shot as soon as possible  *please get regular gentle exercise - there are a number of great and easy videos on youtube  *eat three healthy meals per day, drink plenty of water      We recommend the following healthy lifestyle for LIFE: 1) Small portions. But, make sure to get regular (at least 3 per day), healthy meals and small healthy snacks if needed.  2) Eat a healthy clean diet.   TRY TO EAT: -at least 5-7 servings of low sugar, colorful, and nutrient rich vegetables per day (not corn, potatoes or bananas.) -berries are the best choice if you wish to eat fruit (only eat small amounts if trying to reduce weight)  -lean meets (fish, white meat of chicken or [place]) -vegan proteins for some meals - beans or tofu, whole grains, nuts and seeds -Replace bad fats with good fats - good fats include: fish, nuts and seeds, canola oil, olive oil -small amounts of low fat or non fat dairy -small amounts of100 % whole grains - check the lables -drink plenty of water  AVOID: -SUGAR, sweets, anything with added sugar, corn syrup or sweeteners - must read labels as even foods advertised as "healthy" often are loaded with sugar -if you must have a sweetener, small amounts of stevia may be best -sweetened beverages and artificially sweetened beverages -simple starches (rice, bread, potatoes, pasta, chips, etc - small amounts of 100% whole grains are ok) -red meat, pork, butter -fried foods, fast food, processed food, excessive  dairy, eggs and coconut.  3)Get at least 150 minutes of sweaty aerobic exercise per week.  4)Reduce stress - consider counseling, meditation and relaxation to balance other aspects of your life.                     This is a list of the screening recommended for you and due dates:  Health Maintenance  Topic Date Due  . Flu Shot  05/04/2019  . Eye exam for diabetics  08/04/2019  . Hemoglobin A1C  08/23/2019  . Complete foot exam   09/12/2019  . Mammogram  05/28/2020 - reschedule this as planned  . Colon Cancer Screening  07/28/2021  . Tetanus Vaccine  03/12/2026  . DEXA scan (bone density measurement)  Completed  . Pneumonia vaccines  Completed  .  Hepatitis C: One time screening is recommended by Center for Disease Control  (CDC) for  adults born from [birth year] through 1967.   Addressed
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
## 2019-06-05 ENCOUNTER — Other Ambulatory Visit: Payer: Self-pay

## 2019-06-05 ENCOUNTER — Ambulatory Visit (HOSPITAL_COMMUNITY)
Admission: RE | Admit: 2019-06-05 | Discharge: 2019-06-05 | Disposition: A | Discharge status: Home / Self Care | Payer: Medicare Other | Source: Ambulatory Visit | Attending: Urology | Admitting: Urology

## 2019-06-05 DIAGNOSIS — D3000 Benign neoplasm of unspecified kidney: Secondary | ICD-10-CM | POA: Insufficient documentation

## 2019-06-05 MED ORDER — GADOBUTROL 1 MMOL/ML IV SOLN
6.0000 mL | Freq: Once | INTRAVENOUS | Status: AC | PRN
  Administered 2019-06-05: 6 mL via INTRAVENOUS

## 2019-07-01 NOTE — Progress Notes (Signed)
Pamela Patton was seen today in follow up for Parkinsons disease.  Pt is currently on carbidopa/levodopa 25/100, 2 tablets 3 times per day (9am/2pm/7pm).  Pt denies falls.  Pt denies lightheadedness, near syncope.  No hallucinations.  Mood has been depressed but she states that is because of being confined during the pandemic.  Reports that swallowing has been good.  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  Medical records have been reviewed since last visit.  Saw primary care on April 24, 2019.  No changes were made with her medications.  She does follow with endocrinology for her diabetes.   PREVIOUS MEDICATIONS: Sinemet  ALLERGIES:  No Known Allergies  CURRENT MEDICATIONS:  Outpatient Encounter Medications as of 07/02/2019  Medication Sig  . aspirin 81 MG tablet Take 81 mg by mouth daily with lunch.   Marland Kitchen atorvastatin (LIPITOR) 40 MG tablet Take 40 mg by mouth daily with lunch.   . Blood Pressure Monitoring (BLOOD PRESSURE CUFF) MISC 1 Product by Does not apply route as needed.  . Calcium Carbonate-Vitamin D (CALTRATE 600+D) 600-400 MG-UNIT per tablet Take 1 tablet by mouth 3 (three) times a week.   . carbidopa-levodopa (SINEMET IR) 25-100 MG tablet TAKE 1 & 1/2 TABLETS BY MOUTH 3 TIMES DAILY (Patient taking differently: 2 tablets 3 (three) times daily. )  . Cholecalciferol (VITAMIN D PO) Take 5,000 Units by mouth 3 (three) times a week.   . Cyanocobalamin (VITAMIN B12 PO) Take 1 tablet by mouth daily.  Marland Kitchen glucose 4 GM chewable tablet Chew 1 tablet by mouth as needed for low blood sugar.  Marland Kitchen HUMALOG KWIKPEN 100 UNIT/ML KiwkPen Inject 0-4 Units into the skin 3 (three) times daily with meals.   . hydrochlorothiazide (HYDRODIURIL) 25 MG tablet Take 25 mg by mouth daily with lunch.   . insulin glargine (LANTUS) 100 UNIT/ML injection Inject 15 Units into the skin every evening.   Marland Kitchen levothyroxine  (SYNTHROID, LEVOTHROID) 75 MCG tablet Take 75 mcg by mouth daily.  Marland Kitchen losartan (COZAAR) 25 MG tablet Take 2 tablets (50 mg total) by mouth daily.  Marland Kitchen METFORMIN HCL PO Take 500 mg by mouth 2 (two) times daily.   . NONFORMULARY OR COMPOUNDED ITEM [facility]:  Antifungal - Terbinafine 3%, Fluconazole 2%, Tea Tree Oil 5%, Urea 10%, Ibuprofen 2% in DMSO Suspension #38m. Apply to the affected nail(s) once (at bedtime) or twice daily.  . Nutritional Supplements (FEEDING SUPPLEMENT, GLUCERNA 1.2 CAL,) LIQD Place 237 mLs into feeding tube daily with breakfast.  . Pioglitazone HCl (ACTOS PO) Take 45 mg by mouth daily with lunch.   .Vladimir FasterGlycol-Propyl Glycol (SYSTANE) 0.4-0.3 % GEL ophthalmic gel Place 1 application into both eyes at bedtime as needed (FOR EYES).   Facility-Administered Encounter Medications as of 07/02/2019  Medication  . 0.9 %  sodium chloride infusion    PAST MEDICAL HISTORY:   Past Medical History:  Diagnosis Date  . Anemia   . Arthritis   . Cataract    immature   . Chest pain summer 2012  . Chicken pox   . Diverticulosis    difficulty with colonoscopy 2005  . 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 AElyse Hsu1/03/2013  . Fibroid    fibroid tumor  . Fracture    radial/ulnar 03/2016  . GERD (gastroesophageal reflux disease)   .  Heart murmur    innocent murmur  . High cholesterol   . Hypertension   . Hypothyroid, s/p radioactive iodine tx for graves 2001 08/24/2012  . Intestinal metaplasia of gastric mucosa   . Kidney disease   . PD (Parkinson's disease) ([place]) 08/08/2016  . Renal cancer ([place])   . UTI (urinary tract infection)   . Weight loss    eval with GI in 2016; s/p CT/MRI and PET scans    PAST SURGICAL HISTORY:   Past Surgical History:  Procedure Laterality Date  . ABDOMINAL HYSTERECTOMY  1989  . CATARACT EXTRACTION, BILATERAL    . COLONOSCOPY    . DG BARIUM SWALLOW ([place] HX)     9 yrs ago per pt  due to incomplete colonsocopy    SOCIAL HISTORY:   Social History   Socioeconomic History  . Marital status: Single    Spouse name: Not on file  . Number of children: 1  . Years of education: Not on file  . Highest education level: Doctorate  Occupational History  . 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 Financial resource strain: Not on file  . Food insecurity    Worry: Not on file    Inability: Not on file  . Transportation needs    Medical: Not on file    Non-medical: Not on file  Tobacco Use  . Smoking status: Never Smoker  . Smokeless tobacco: Never Used  Substance and Sexual Activity  . Alcohol use: No    Alcohol/week: 0.0 standard drinks  . Drug use: No  . Sexual activity: Not on file  Lifestyle  . Physical activity    Days per week: Not on file    Minutes per session: Not on file  . Stress: Not on file  Relationships  . Social Herbalist on phone: Not on file    Gets together: Not on file    Attends religious service: Not on file    Active member of club or organization: Not on file    Attends meetings of clubs or organizations: Not on file    Relationship status: Not on file  . Intimate partner violence    Fear of current or ex partner: Not on file    Emotionally abused: Not on file    Physically abused: Not on file    Forced sexual activity: Not on file  Other Topics Concern  . 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 Mother  Deceased  . Father  Deceased  . MGM  Deceased  . MGF   Deceased  . PGM  Deceased  . PGF  Deceased  . Brother  Deceased  . Sister  Alive  . Sister  Deceased  . Sister  Alive  . Neg Hx  (Not Specified)    ROS:  Review of Systems  Constitutional: Negative.   HENT: Negative.  Eyes: Negative.   Respiratory: Negative.   Cardiovascular: Negative.   Gastrointestinal: Negative.   Genitourinary: Negative.   Skin: Negative.     PHYSICAL EXAMINATION:    VITALS:   Vitals:   07/02/19 1300  BP: 136/73  Pulse: 67  SpO2: 99%  Weight: 145 lb 6.4 oz (66 kg)  Height: _0  (1.651 m)    GEN:  The patient appears stated age and is in NAD. HEENT:  Normocephalic, atraumatic.  The mucous membranes are moist. The superficial temporal arteries are without ropiness or tenderness. CV:  RRR Lungs:  CTAB Neck/HEME:  There are no carotid bruits bilaterally.  Neurological examination:  Orientation: The patient is alert and oriented x3. Cranial nerves: There is good facial symmetry with facial hypomimia. The speech is fluent and clear with pseudobulbar qualities. Soft palate rises symmetrically and there is no tongue deviation. Hearing is intact to conversational tone. Sensation: Sensation is intact to light touch throughout Motor: Strength is at least antigravity x4.  Movement examination: Tone: There is mild increased tone in the LUE Abnormal movements: mild tremor in the L thumb Coordination:  There is mild decremation with RAM's, with any form of RAMS, including alternating supination and pronation of the forearm, hand opening and closing, finger taps, heel taps and toe taps on the L Gait and Station: The patient has no difficulty arising out of a deep-seated chair without the use of the hands. The patient's stride length is good.     ASSESSMENT/PLAN:  1. Parkinsonism. has pseudobulbar speech but not seen development of atypical state over time -continuecarbidopa/levodopa 25/100, 2 po tid.Risks, benefits, side effects and  alternative therapies were discussed. The opportunity to ask questions was given and they were answered to the best of my ability. The patient expressed understanding and willingness to follow the outlined treatment protocols.  -encouraged exercise   2. Dysphagia -She had a modified barium swallow on 10/13/2016 that demonstrated mild pharyngeal phase dysphagia. Regular diet with thin liquids was recommended.  3. Low blood pressure -Much better after blood pressure medications were discontinued.  4. RBD -watching closely. She doesn't want more medication and has done well since our last visit  5.constipation  -controlling with diet  6.  Depression, situational  -mostly related to pandemic and somewhat politics.  Doesn't want any medications.  States that exercise does help her.  Offered our Education officer, museum for counseling.  Met with our social worker today  7.  Much greater than 50% of this visit was spent in counseling and coordinating care.  Total face to face time:  25 min.  Follow up is anticipated in the next 4-6 months, sooner should new neurologic issues arise.  Cc:  Caren Macadam, MD

## 2019-07-02 ENCOUNTER — Other Ambulatory Visit: Payer: Self-pay

## 2019-07-02 ENCOUNTER — Encounter: Payer: Self-pay | Admitting: Neurology

## 2019-07-02 ENCOUNTER — Ambulatory Visit: Payer: Medicare Other | Admitting: Neurology

## 2019-07-02 VITALS — BP 136/73 | HR 67 | Ht 65.0 in | Wt 145.4 lb

## 2019-07-02 DIAGNOSIS — F330 Major depressive disorder, recurrent, mild: Secondary | ICD-10-CM

## 2019-07-02 DIAGNOSIS — G20 Parkinson's disease: Secondary | ICD-10-CM | POA: Diagnosis not present

## 2019-07-09 ENCOUNTER — Other Ambulatory Visit: Payer: Self-pay

## 2019-07-09 ENCOUNTER — Ambulatory Visit: Payer: Medicare Other | Admitting: Cardiovascular Disease

## 2019-07-09 ENCOUNTER — Encounter: Payer: Self-pay | Admitting: Cardiovascular Disease

## 2019-07-09 VITALS — BP 128/56 | HR 58 | Ht 65.5 in | Wt 145.8 lb

## 2019-07-09 DIAGNOSIS — Z008 Encounter for other general examination: Secondary | ICD-10-CM

## 2019-07-09 DIAGNOSIS — E1159 Type 2 diabetes mellitus with other circulatory complications: Secondary | ICD-10-CM

## 2019-07-09 DIAGNOSIS — E1169 Type 2 diabetes mellitus with other specified complication: Secondary | ICD-10-CM | POA: Diagnosis not present

## 2019-07-09 DIAGNOSIS — I10 Essential (primary) hypertension: Secondary | ICD-10-CM

## 2019-07-09 DIAGNOSIS — E785 Hyperlipidemia, unspecified: Secondary | ICD-10-CM

## 2019-07-09 NOTE — Assessment & Plan Note (Signed)
History of hyperlipidemia on statin therapy with lipid profile performed 05/22/2018 revealing total cholesterol 155, LDL 64 HDL 69

## 2019-07-09 NOTE — Patient Instructions (Signed)

## 2019-07-09 NOTE — Progress Notes (Signed)
07/09/2019 Pamela Patton   [birth year]  LC:[id number]  Primary Physician Caren Macadam, MD Primary Cardiologist: Lorretta Harp MD Lupe Carney, [place]  HPI:  Pamela Patton is a 73 y.o.    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 I last saw her in the office  06/13/2018. Her risk factor profile is remarkable for diabetes, hypertension and hyperlipidemia. She had a Myoview performed 2 years ago which showed breast attenuation artifact.She has been a symptomatic since I saw her urinary health ago. She has developed Parkinson's disease in the interim. Since I saw her a year ago her Parkinson's is remained stable.  She was in a bicycle exercise class but because of COVID-19 exercises, walks outside. She had one episode of chest pain back in March which is similar to her previous episodes.    Since I saw her a year ago she is remained stable.  She is sheltering in place and socially distancing otherwise has no symptoms or complaints.   Current Meds  Medication Sig  . aspirin 81 MG tablet Take 81 mg by mouth daily with lunch.   Marland Kitchen atorvastatin (LIPITOR) 40 MG tablet Take 40 mg by mouth daily with lunch.   . Blood Pressure Monitoring (BLOOD PRESSURE CUFF) MISC 1 Product by Does not apply route as needed.  . Calcium Carbonate-Vitamin D (CALTRATE 600+D) 600-400 MG-UNIT per tablet Take 1 tablet by mouth 3 (three) times a week.   . carbidopa-levodopa (SINEMET IR) 25-100 MG tablet TAKE 1 & 1/2 TABLETS BY MOUTH 3 TIMES DAILY (Patient taking differently: 2 tablets 3 (three) times daily. )  . Cholecalciferol (VITAMIN D PO) Take 5,000 Units by mouth 3 (three) times a week.   . Cyanocobalamin (VITAMIN B12 PO) Take 1 tablet by mouth daily.  Marland Kitchen glucose 4 GM chewable tablet Chew 1 tablet by mouth as needed for low blood sugar.  Marland Kitchen HUMALOG KWIKPEN 100 UNIT/ML KiwkPen Inject 0-4 Units into the skin 3 (three)  times daily with meals.   . hydrochlorothiazide (HYDRODIURIL) 25 MG tablet Take 25 mg by mouth daily with lunch.   . insulin glargine (LANTUS) 100 UNIT/ML injection Inject 15 Units into the skin every evening.   Marland Kitchen levothyroxine (SYNTHROID, LEVOTHROID) 75 MCG tablet Take 75 mcg by mouth daily.  Marland Kitchen losartan (COZAAR) 25 MG tablet Take 2 tablets (50 mg total) by mouth daily.  Marland Kitchen METFORMIN HCL PO Take 500 mg by mouth 2 (two) times daily.   . NONFORMULARY OR COMPOUNDED ITEM [facility]:  Antifungal - Terbinafine 3%, Fluconazole 2%, Tea Tree Oil 5%, Urea 10%, Ibuprofen 2% in DMSO Suspension #40ml. Apply to the affected nail(s) once (at bedtime) or twice daily.  . Nutritional Supplements (FEEDING SUPPLEMENT, GLUCERNA 1.2 CAL,) LIQD Place 237 mLs into feeding tube daily with breakfast.  . Pioglitazone HCl (ACTOS PO) Take 45 mg by mouth daily with lunch.   Vladimir Faster Glycol-Propyl Glycol (SYSTANE) 0.4-0.3 % GEL ophthalmic gel Place 1 application into both eyes at bedtime as needed (FOR EYES).   Current Facility-Administered Medications for the 07/09/19 encounter (Office Visit) with Lorretta Harp, MD  Medication  . 0.9 %  sodium chloride infusion     No Known Allergies  Social History   Socioeconomic History  . Marital status: Single    Spouse name: Not on file  . Number of children: 1  . Years of education: Not  on file  . Highest education level: Doctorate  Occupational History  . 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 Financial resource strain: Not on file  . Food insecurity    Worry: Not on file    Inability: Not on file  . Transportation needs    Medical: Not on file    Non-medical: Not on file  Tobacco Use  . Smoking status: Never Smoker  . Smokeless tobacco: Never Used  Substance and Sexual Activity  . Alcohol use: No    Alcohol/week: 0.0 standard drinks  . Drug use: No  . Sexual activity: Not on file   Lifestyle  . Physical activity    Days per week: Not on file    Minutes per session: Not on file  . Stress: Not on file  Relationships  . Social Herbalist on phone: Not on file    Gets together: Not on file    Attends religious service: Not on file    Active member of club or organization: Not on file    Attends meetings of clubs or organizations: Not on file    Relationship status: Not on file  . Intimate partner violence    Fear of current or ex partner: Not on file    Emotionally abused: Not on file    Physically abused: Not on file    Forced sexual activity: Not on file  Other Topics Concern  . 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  Cardiovascular: negative for chest pain, dyspnea on exertion, edema, orthopnea, palpitations, paroxysmal nocturnal dyspnea or shortness of breath Dermatological: negative for rash Respiratory: negative for cough or wheezing Urologic: negative for hematuria Abdominal: negative for nausea, vomiting, diarrhea, bright red blood per rectum, melena, or hematemesis Neurologic: negative for visual changes, syncope, or dizziness All other systems reviewed and are otherwise negative except as noted above.    Blood pressure (!) 128/56, pulse (!) 58, height 5' 5.5" (1.664 m), weight 145 lb 12.8 oz (66.1 kg).  General appearance: alert and no distress Neck: no adenopathy, no carotid bruit, no JVD, supple, symmetrical, trachea midline and thyroid not enlarged, symmetric, no tenderness/mass/nodules Lungs: clear to  auscultation bilaterally Heart: regular rate and rhythm, S1, S2 normal, no murmur, click, rub or gallop Extremities: extremities normal, atraumatic, no cyanosis or edema Pulses: 2+ and symmetric Skin: Skin color, texture, turgor normal. No rashes or lesions Neurologic: Alert and oriented X 3, normal strength and tone. Normal symmetric reflexes. Normal coordination and gait  EKG sinus bradycardia 58 without ST or T wave changes.  I personally reviewed this EKG.  ASSESSMENT AND PLAN:   Hypertension associated with diabetes ([place]) History of essential hypertension with blood pressure measured today 128/56.  She is on hydrochlorothiazide and losartan.  Hyperlipidemia associated with type 2 diabetes mellitus ([place]) History of hyperlipidemia on statin therapy with lipid profile performed 05/22/2018  revealing total cholesterol 155, LDL 64 HDL 69      Lorretta Harp MD [facility], [facility] 07/09/2019 9:01 AM

## 2019-07-09 NOTE — Assessment & Plan Note (Signed)
History of essential hypertension with blood pressure measured today 128/56.  She is on hydrochlorothiazide and losartan.

## 2019-07-11 ENCOUNTER — Other Ambulatory Visit: Payer: Self-pay | Admitting: Family Medicine

## 2019-07-11 DIAGNOSIS — Z1231 Encounter for screening mammogram for malignant neoplasm of breast: Secondary | ICD-10-CM

## 2019-07-31 ENCOUNTER — Institutional Professional Consult (permissible substitution): Payer: Medicare Other | Admitting: Clinical

## 2019-08-02 ENCOUNTER — Ambulatory Visit (INDEPENDENT_AMBULATORY_CARE_PROVIDER_SITE_OTHER): Payer: Medicare Other | Admitting: Clinical

## 2019-08-02 ENCOUNTER — Other Ambulatory Visit: Payer: Self-pay

## 2019-08-02 DIAGNOSIS — F330 Major depressive disorder, recurrent, mild: Secondary | ICD-10-CM

## 2019-08-06 LAB — HM DIABETES EYE EXAM

## 2019-08-07 ENCOUNTER — Other Ambulatory Visit: Payer: Self-pay | Admitting: Neurology

## 2019-08-13 NOTE — BH Specialist Note (Signed)
Patient presents with h/o MDD, previously diagnosed.LCSW assessment today is positive for symptomology of OCD however does not appear to be impairing functioning at this time.   Pt is presently experiencing mild depressive sx exacerbated by social isolation of covid pandemic and life adjustments of retirement and grief following deaths of two close friends.  [facility] provided supportive counseling as pt detailed efforts to cope including maintaining some social connection through family prayer via zoom, zoom church, and social distance porch visit with friend. 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 Pt has one adult son who lives in [place].  Pt complains of Bradykinesia and Apathy and sleeping 12am-5am. Pt will likely benefit from functional plan to increase activity through CBT behavior activation and improved sleep hygiene. Pt responded well in session. [facility] will remain available for future consultation.   1. Patient to follow up with [facility] in:  ~3 weeks 2. Medication Recommendation: none 3. Behavioral Recommendation(s): A: consider physical activity such as Parkinson's Brain Dance via zoom B: consider opportunities for safe social engagement

## 2019-08-20 ENCOUNTER — Encounter: Payer: Self-pay | Admitting: Family Medicine

## 2019-08-20 ENCOUNTER — Other Ambulatory Visit: Payer: Self-pay

## 2019-08-20 ENCOUNTER — Telehealth (INDEPENDENT_AMBULATORY_CARE_PROVIDER_SITE_OTHER): Payer: Medicare Other | Admitting: Family Medicine

## 2019-08-20 DIAGNOSIS — E1169 Type 2 diabetes mellitus with other specified complication: Secondary | ICD-10-CM | POA: Diagnosis not present

## 2019-08-20 DIAGNOSIS — G20 Parkinson's disease: Secondary | ICD-10-CM | POA: Diagnosis not present

## 2019-08-20 DIAGNOSIS — E1129 Type 2 diabetes mellitus with other diabetic kidney complication: Secondary | ICD-10-CM | POA: Diagnosis not present

## 2019-08-20 DIAGNOSIS — E1159 Type 2 diabetes mellitus with other circulatory complications: Secondary | ICD-10-CM

## 2019-08-20 DIAGNOSIS — I779 Disorder of arteries and arterioles, unspecified: Secondary | ICD-10-CM

## 2019-08-20 DIAGNOSIS — E039 Hypothyroidism, unspecified: Secondary | ICD-10-CM

## 2019-08-20 DIAGNOSIS — Z794 Long term (current) use of insulin: Secondary | ICD-10-CM

## 2019-08-20 DIAGNOSIS — I10 Essential (primary) hypertension: Secondary | ICD-10-CM

## 2019-08-20 DIAGNOSIS — E785 Hyperlipidemia, unspecified: Secondary | ICD-10-CM

## 2019-08-20 NOTE — Progress Notes (Signed)
Virtual Visit via Video Note  I connected with [name]  on 08/20/19 at 10:20 AM EST by a video enabled telemedicine application and verified that I am speaking with the correct person using two identifiers.  Location patient: home Location provider:work or home office Persons participating in the virtual visit: patient, provider  I discussed the limitations of evaluation and management by telemedicine and the availability of in person appointments. The patient expressed understanding and agreed to proceed.   HPI:  Pamela Patton is a pleasant 73 yo seen for follow up. She has a PMH of DM, Hypothyroidism, HTN, HLD, GERD, Parkinsons and renal carcinoma. She continues to see her specialists for management of her Diabetes, Hypothyroidism, Parkinson's and Renal cancer. She is getting cognitive behavioral therapy for depressed mood and reports feels well.  Reports has been doing ok. Her biggest complaint is she is no longer able to sleep all the way through the night. She also tends to get some anxiety. She feels that the CBT through her neurology office is helping with this. She is having a session about every two weeks. She also is frustrated that she can no longer exercise in class due to the pandemic. And, she no longer feels safe walking outside after dark. She has started walking during daylight. She does loops around her apartment complex for 30 minutes 5 days per week and she "feels great" when she does this.  Reports last hgba1c with specialist was 7.3 with recent endocrine visit. Reports she has a full panel of labs with endo next week. She has a goal of BG under 120 and she denies any low blood sugars. Her BP looked good at her recent visit with cardiology. Reports she continues to see her kidney specialist and they are monitoring her kidneys with CT scans.   ROS: See pertinent positives and negatives per HPI.  Past Medical History:  Diagnosis Date  . Anemia   . Arthritis   . Cataract    immature   . Chest pain summer 2012  . Chicken pox   . Diverticulosis    difficulty with colonoscopy 2005  . 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 Elyse Hsu 10/08/2012  . Fibroid    fibroid tumor  . Fracture    radial/ulnar 03/2016  . GERD (gastroesophageal reflux disease)   . Heart murmur    innocent murmur  . High cholesterol   . Hypertension   . Hypothyroid, s/p radioactive iodine tx for graves 2001 08/24/2012  . Intestinal metaplasia of gastric mucosa   . Kidney disease   . PD (Parkinson's disease) ([place]) 08/08/2016  . Renal cancer ([place])   . UTI (urinary tract infection)   . Weight loss    eval with GI in 2016; s/p CT/MRI and PET scans    Past Surgical History:  Procedure Laterality Date  . ABDOMINAL HYSTERECTOMY  1989  . CATARACT EXTRACTION, BILATERAL    . COLONOSCOPY    . DG BARIUM SWALLOW ([facility])     9 yrs ago per pt due to incomplete colonsocopy    Family History  Problem Relation Age of Onset  . Cancer Mother        liver  . Liver cancer Mother   . Stroke Father 86  . Diabetes Father   . Hypertension Father   . Diabetes Maternal Grandmother   . Prostate cancer Maternal Grandfather   . Pancreatic cancer Brother   . Diabetes Sister   . Cancer Sister   .  Diabetes Sister   . Colon polyps Neg Hx   . Colon cancer Neg Hx   . Esophageal cancer Neg Hx   . Rectal cancer Neg Hx   . Stomach cancer Neg Hx     SOCIAL HX: see hpi   Current Outpatient Medications:  .  aspirin 81 MG tablet, Take 81 mg by mouth daily with lunch. , Disp: , Rfl:  .  atorvastatin (LIPITOR) 40 MG tablet, Take 40 mg by mouth daily with lunch. , Disp: , Rfl:  .  Blood Pressure Monitoring (BLOOD PRESSURE CUFF) MISC, 1 Product by Does not apply route as needed., Disp: 1 each, Rfl: 0 .  Calcium Carbonate-Vitamin D (CALTRATE 600+D) 600-400 MG-UNIT per tablet, Take 1 tablet by mouth 3 (three) times a week. , Disp: , Rfl:  .  carbidopa-levodopa  (SINEMET IR) 25-100 MG tablet, TAKE 1 & 1/2 TABLETS BY MOUTH 3 TIMES DAILY, Disp: 405 tablet, Rfl: 1 .  Cholecalciferol (VITAMIN D PO), Take 5,000 Units by mouth 3 (three) times a week. , Disp: , Rfl:  .  Cyanocobalamin (VITAMIN B12 PO), Take 1 tablet by mouth daily., Disp: , Rfl:  .  glucose 4 GM chewable tablet, Chew 1 tablet by mouth as needed for low blood sugar., Disp: , Rfl:  .  HUMALOG KWIKPEN 100 UNIT/ML KiwkPen, Inject 0-4 Units into the skin 3 (three) times daily with meals. , Disp: , Rfl:  .  hydrochlorothiazide (HYDRODIURIL) 25 MG tablet, Take 25 mg by mouth daily with lunch. , Disp: , Rfl:  .  insulin glargine (LANTUS) 100 UNIT/ML injection, Inject 15 Units into the skin every evening. , Disp: , Rfl:  .  levothyroxine (SYNTHROID, LEVOTHROID) 75 MCG tablet, Take 75 mcg by mouth daily., Disp: , Rfl:  .  losartan (COZAAR) 25 MG tablet, Take 2 tablets (50 mg total) by mouth daily., Disp: 180 tablet, Rfl: 3 .  METFORMIN HCL PO, Take 500 mg by mouth 2 (two) times daily. , Disp: , Rfl:  .  NONFORMULARY OR COMPOUNDED ITEM, [facility]:  Antifungal - Terbinafine 3%, Fluconazole 2%, Tea Tree Oil 5%, Urea 10%, Ibuprofen 2% in DMSO Suspension #32ml. Apply to the affected nail(s) once (at bedtime) or twice daily., Disp: 30 each, Rfl: 5 .  Nutritional Supplements (FEEDING SUPPLEMENT, GLUCERNA 1.2 CAL,) LIQD, Place 237 mLs into feeding tube daily with breakfast., Disp: , Rfl:  .  Pioglitazone HCl (ACTOS PO), Take 45 mg by mouth daily with lunch. , Disp: , Rfl:  .  Polyethyl Glycol-Propyl Glycol (SYSTANE) 0.4-0.3 % GEL ophthalmic gel, Place 1 application into both eyes at bedtime as needed (FOR EYES)., Disp: , Rfl:   Current Facility-Administered Medications:  .  0.9 %  sodium chloride infusion, 500 mL, Intravenous, Continuous, Armbruster, Carlota Raspberry, MD  EXAM:  VITALS per patient if applicable:none  GENERAL: alert, oriented, appears well and in no acute distress  HEENT: atraumatic,  conjunttiva clear, no obvious abnormalities on inspection of external nose and ears  NECK: normal movements of the head and neck  LUNGS: on inspection no signs of respiratory distress, breathing rate appears normal, no obvious gross SOB, gasping or wheezing  CV: no obvious cyanosis  MS: moves all visible extremities without noticeable abnormality  PSYCH/NEURO: pleasant and cooperative, no obvious depression or anxiety, speech and thought processing grossly intact  ASSESSMENT AND PLAN:  Discussed the following assessment and plan:  Type 2 diabetes mellitus with other diabetic kidney complication, with long-term current use of insulin ([facility])  Hypertension associated with diabetes ([place])  Hyperlipidemia associated with type 2 diabetes mellitus ([place])  PD (Parkinson's disease) ([place])  Carotid artery disease, unspecified laterality, unspecified type ([place])  Hypothyroidism, unspecified type  -congratulated her and encourage her to continue the exercise and a healthy diet -discussed sleep issues and treatment, advised sleep hygiene, naps if able and CBT for now - would avoid sleep medications -glad she is doing CBT for the mood, she denies depression -she does labs with endo -discussed hypoglycemia management, no recent lows -reminded her of eye exam due in epic - she reports this was done on Nov 3rd - Dr. Parke Simmers -follow up 3-4 months   I discussed the assessment and treatment plan with the patient. The patient was provided an opportunity to ask questions and all were answered. The patient agreed with the plan and demonstrated an understanding of the instructions.   The patient was advised to call back or seek an in-person evaluation if the symptoms worsen or if the condition fails to improve as anticipated.   Lucretia Kern, DO

## 2019-08-23 ENCOUNTER — Ambulatory Visit: Payer: Medicare Other | Admitting: Clinical

## 2019-08-23 ENCOUNTER — Other Ambulatory Visit: Payer: Self-pay

## 2019-08-23 DIAGNOSIS — F330 Major depressive disorder, recurrent, mild: Secondary | ICD-10-CM

## 2019-08-27 ENCOUNTER — Encounter: Payer: Self-pay | Admitting: Family Medicine

## 2019-08-27 NOTE — BH Specialist Note (Signed)
Referring Provider: [name], DO Date of Referral: 07/02/19  Primary Reason for Referral: [name], DO Location of Service: Individual, office visit  Suicide/Homicide Risk: Pt denies risk  Subjective Notes: sleep is better, now sleeping 10:30pm-5:30am sleeping with tv on, inserts into dreams son plans to come for Christmas distress re getting house in order, trying to put it off recounts stories of playing piano for kids and teaching them words and definitions that they would then use to annoy their parents "how marvelous" has enjoyed various hobbies over time-planting, choir talks with sisters in [place], attending church online  Psychosocial Assessment Patient presents with h/o Parkinson's Disease. Pt presents today for integrated behavioral health care in the management of chronic sx related to PD, along with supportive counseling for presenting problem of  MDD, mild, recurrent. Pt reports sleep in improved, sleeping longer. LCSW provided psychoeducation as pt sleeps with TV.  Pt continues to experience mild depressive sx likely exacerbated by ongoing social isolation of covid pandemic. Discussed plan from last session of engagement with Parkinson's Brain Dance, which pt did not complete. Pt described process of cognitive rigidity as reason for not engaging but remains contemplative, pt continues effort to exercise at home and notes mood improvement is correlated. LCSW provided supportive counseling as pt expresses mild distress re son's plan to visit at Christmas as well as desire to organize house. Pt would likely benefit from behavior activation plan in future sessions. Also noting sx consistent with OCD likely impacting pt's ability to activate behavior as well as Parkinson's related Apathy. Pt responded well to supportive counseling and psychoeducation, pt states relief at end of session "I guess I needed to talk about it, I feel better". LCSW will f/u with pt virtually as pt  requested.    Brief Interventions provided today in session 1. psychoeducation, patient education 2. supportive counseling   Plan 1. Turn off TV before bed. Consider other behavior recommendations to promote better sleep, info provided to pt to take home and read 2. Goal for exercise- 4x/wk  Behavioral Health treatment recommendations communicated to referring provider and pt states agreement with plan. LCSW will remain available for future consultation.

## 2019-08-28 ENCOUNTER — Ambulatory Visit
Admission: RE | Admit: 2019-08-28 | Discharge: 2019-08-28 | Disposition: A | Discharge status: Home / Self Care | Payer: Medicare Other | Source: Ambulatory Visit | Attending: Family Medicine | Admitting: Family Medicine

## 2019-08-28 ENCOUNTER — Other Ambulatory Visit: Payer: Self-pay

## 2019-08-28 DIAGNOSIS — Z1231 Encounter for screening mammogram for malignant neoplasm of breast: Secondary | ICD-10-CM

## 2019-09-20 ENCOUNTER — Other Ambulatory Visit: Payer: Self-pay | Admitting: Family Medicine

## 2019-09-23 ENCOUNTER — Ambulatory Visit: Payer: Medicare Other | Admitting: Clinical

## 2019-09-23 ENCOUNTER — Other Ambulatory Visit: Payer: Self-pay

## 2019-10-02 ENCOUNTER — Other Ambulatory Visit: Payer: Self-pay

## 2019-10-02 ENCOUNTER — Ambulatory Visit (INDEPENDENT_AMBULATORY_CARE_PROVIDER_SITE_OTHER): Payer: Medicare Other | Admitting: Clinical

## 2019-10-02 DIAGNOSIS — F330 Major depressive disorder, recurrent, mild: Secondary | ICD-10-CM

## 2019-10-08 NOTE — BH Specialist Note (Signed)
Referring Provider: [name], DO Date of Referral: 07/02/19       Primary Reason for Referral: [name], DO Location of Service: Individual, virtual visit  Suicide/Homicide Risk: Pt denies risk  Subjective Notes: -son did not come home for Christmas d/t covid pandemic, pt accepting of choice to further delay seeing each other distress re getting house in order, trying to put it off -beginning phase of church project  -family and church socialization via phone,zoom  Psychosocial Assessment Patient presentswith h/o Parkinson's Disease. Pt presents today for integrated behavioral health care in the management of chronic sx related to PD, along with supportive counseling for presenting problem of  MDD, mild, recurrent.  Pt continues to experience mild depressive sx likely exacerbated by ongoing social isolation of covid pandemic. Pt continues contemplation re engagement in virtual social exercise opportunities though pt continues goal  to walk ~5 days/week.    LCSW provided assessment and supportive counseling as pt spent time reflecting on activities she has found enjoyable in the past, identified new project at church as a potential activity for engagement. Pt responded well to supportive counseling and behavior activation goals today.  LCSW will f/u with pt virtually in ~1 mo as pt requested.    Brief Interventions provided today in session 1. psychoeducation, patient education 2. supportive counseling, behavior activation  Plan 1. Call friend for more info about church project 2. Goal for exercise-continue walk 4-5x/wk Consider online exercise opportunities for social as well as physical health benefit  Behavioral Health treatment recommendations communicated to referring provider and pt states agreement with plan. LCSW will remain available for future consultation.

## 2019-10-14 IMAGING — MR MR ABDOMEN WO/W CM
10 of 19 series · 21 of 48 positions shown · IV contrast (gadavist)
Comparison: 06/07/2018

CLINICAL DATA: Follow-up left renal mass and pancreatic cystic
lesions.

EXAM:
MRI ABDOMEN WITHOUT AND WITH CONTRAST
TECHNIQUE: Multiplanar multisequence MR imaging of the abdomen was performed
both before and after the administration of intravenous contrast.
CONTRAST:  6 mL Gadavist

[Series 3: T2 fat-sat · axial · 5.0mm · 0.78mm/px · z∈[-50,+175]mm · 2 of 46 slices shown]
[im 1/46]
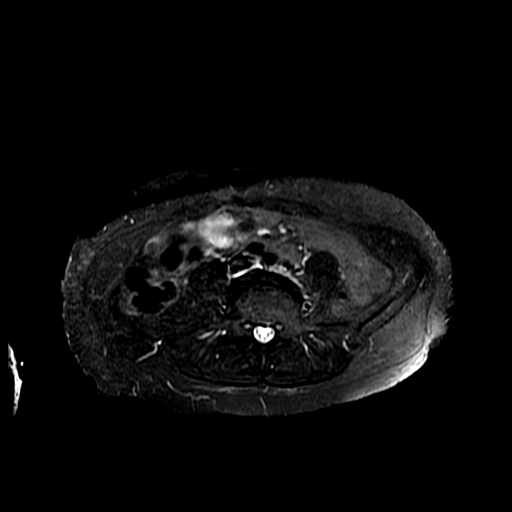
[im 46/46]
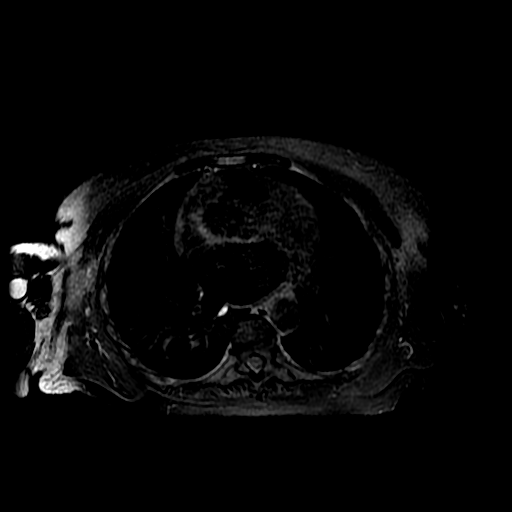

[Series 4: DWI b500 · axial · 6.0mm · 1.48mm/px · z∈[-51,+176]mm · 2 of 60 slices shown]
[im 1/60]
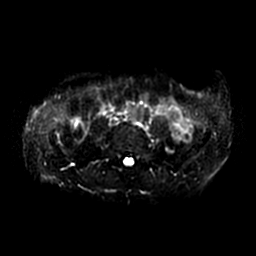
[im 60/60]
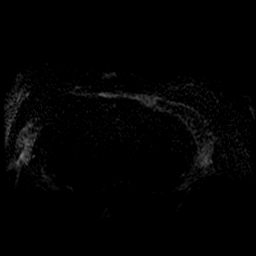

[Series 5: T2 · axial · 5.0mm · 0.78mm/px · z∈[-50,+175]mm · 2 of 46 slices shown (1 of 2)]
[im 1/46]
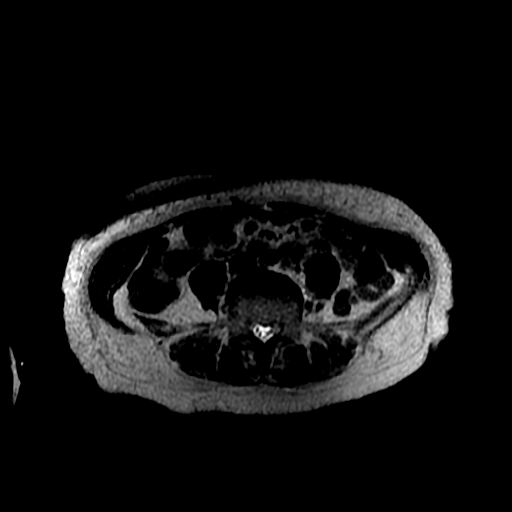
[im 46/46]
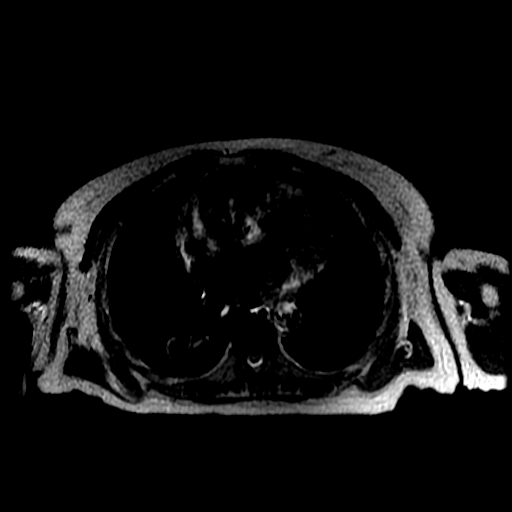

[Series 6: T2 · coronal · 5.0mm · 0.70mm/px · 1 of 37 slices shown (2 of 2)]
[im 1/37]
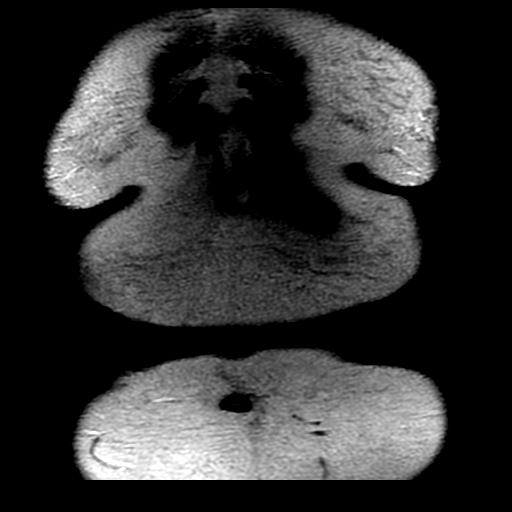

[Series 7: bSSFP · axial · 5.0mm · 0.78mm/px · z∈[-50,+175]mm · 2 of 46 slices shown]
[im 1/46]
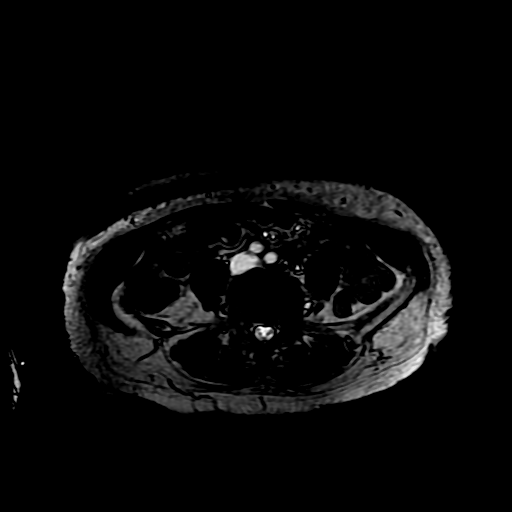
[im 46/46]
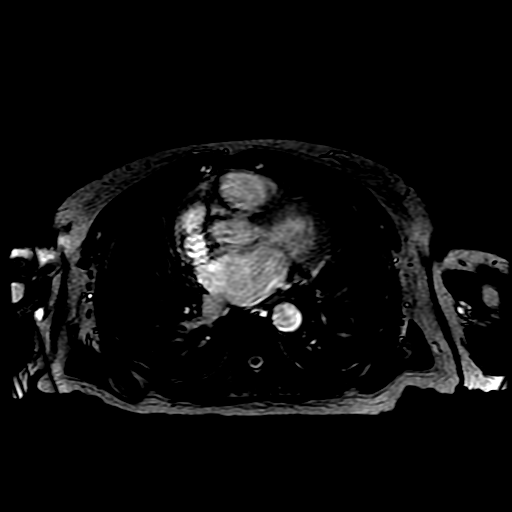

[Series 8: ax dualecho bh · axial · 5.0mm · 0.78mm/px · z∈[-50,+175]mm · 3 of 92 slices shown]
[im 1/92]
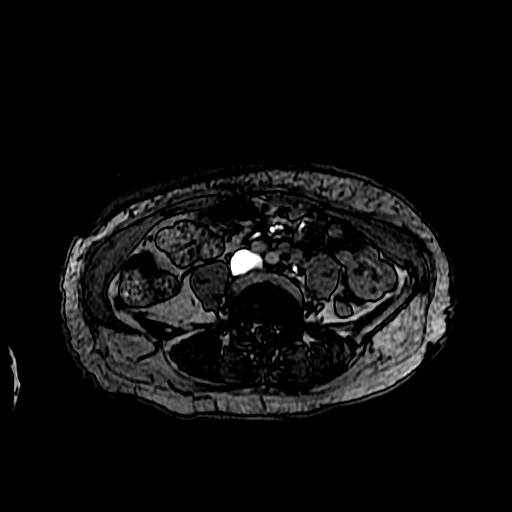
[im 46/92]
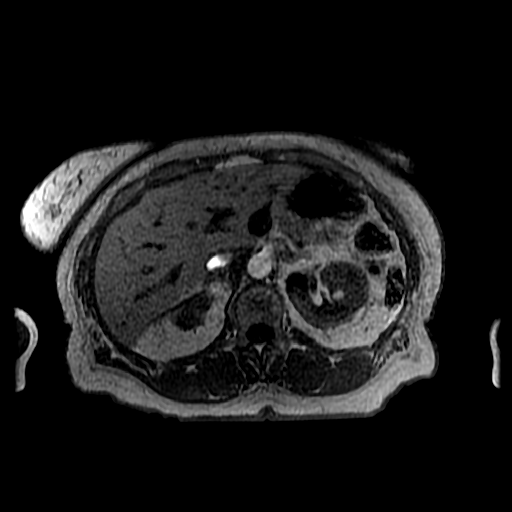
[im 92/92]
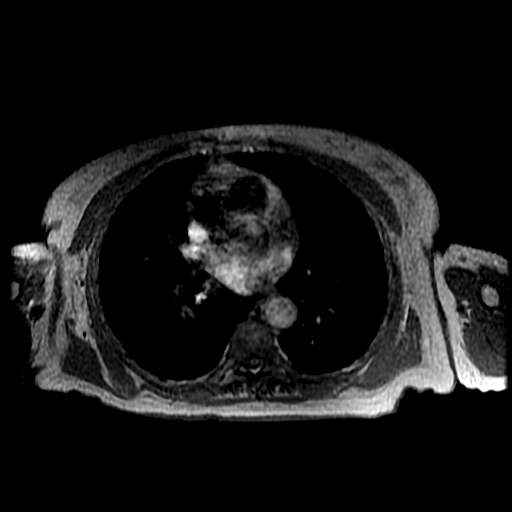

[Series 400: DWI · axial · 6.0mm · 1.48mm/px · 1 of 30 slices shown]
[im 1/30]
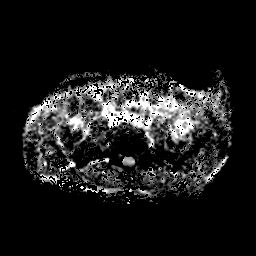

[Series 1000: T1 dynamic · axial · 5.8mm · 0.78mm/px · z∈[-68,+185]mm · 3 of 88 slices shown (1 of 3)]
[im 1/88]
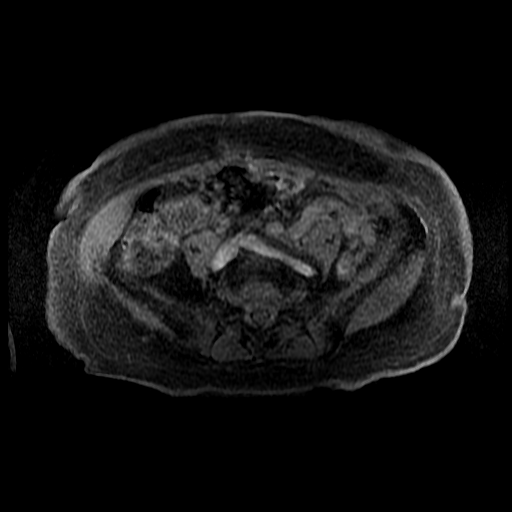
[im 44/88]
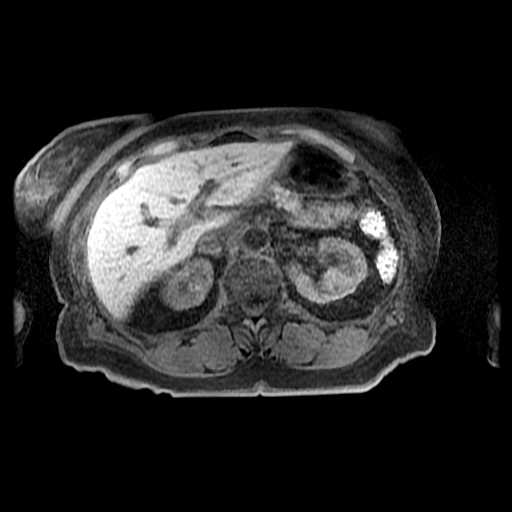
[im 88/88]
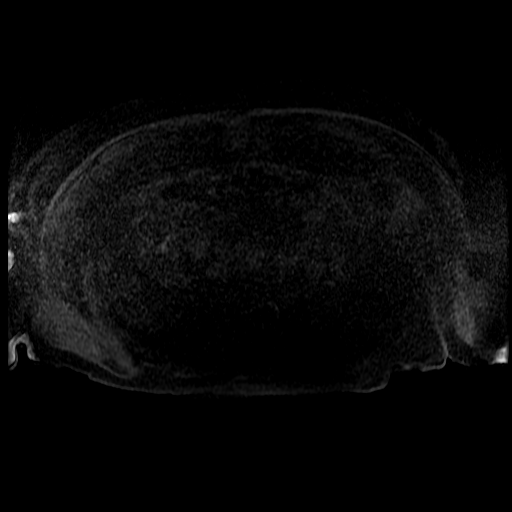

[Series 1001: T1 dynamic · axial · 5.8mm · 0.78mm/px · z∈[-68,+185]mm · 3 of 88 slices shown (2 of 3)]
[im 1/88]
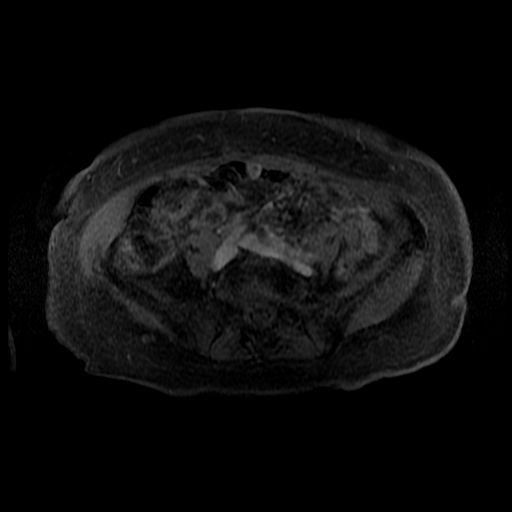
[im 44/88]
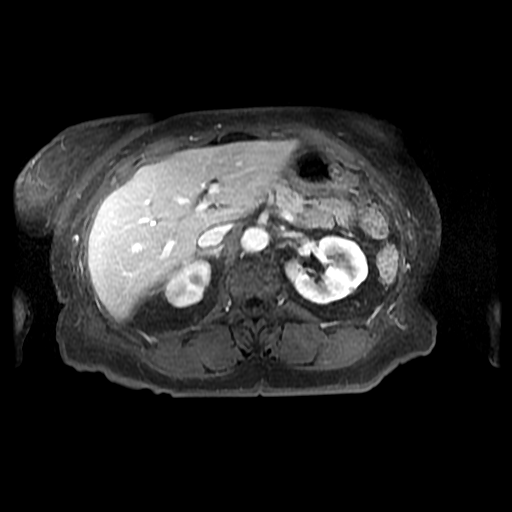
[im 88/88]
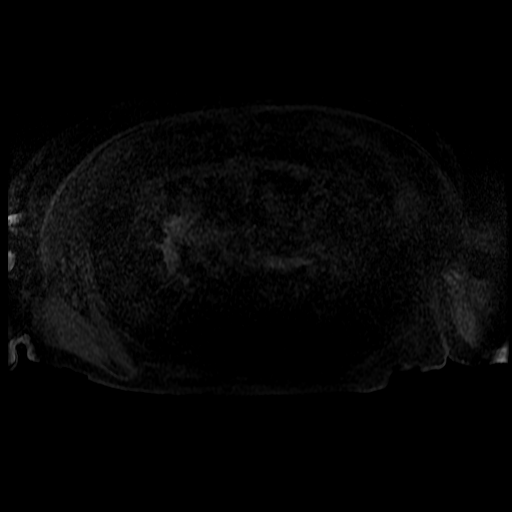

[Series 1002: T1 dynamic · axial · 5.8mm · 0.78mm/px · z∈[-68,+57]mm · 2 of 88 slices shown (3 of 3)]
[im 1/88]
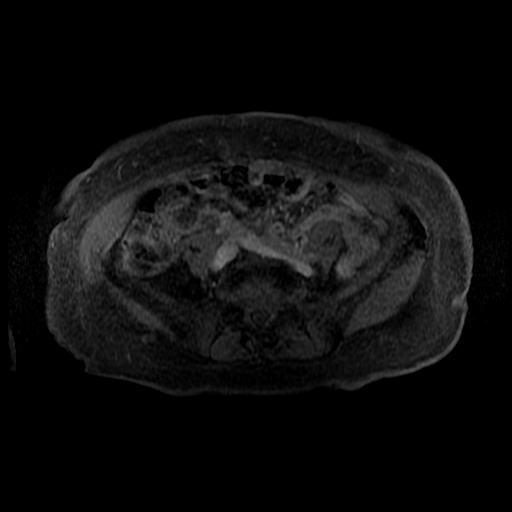
[im 44/88]
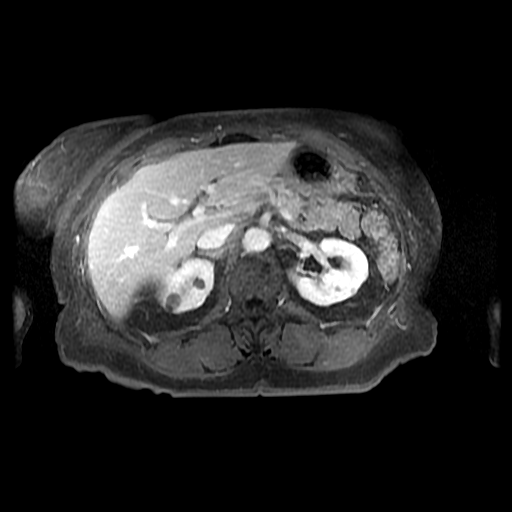

[21 of 48 positions shown; findings below may reference images not displayed]

FINDINGS: Lower chest: No acute findings.

Hepatobiliary: No hepatic masses identified. Gallbladder is
unremarkable. No evidence of biliary ductal dilatation. No evidence
of biliary ductal dilatation.

Pancreas: 1.2 cm simple appearing cyst in the uncinate process of
pancreas is unchanged. Smaller sub-cm cystic foci in the pancreatic
head and tail are also unchanged. No new or enlarging pancreatic
lesions identified. No evidence of pancreatic ductal dilatation.

Spleen:  Within normal limits in size and appearance.

Adrenals/Urinary Tract: A mass is seen in the posterior lower pole
the left kidney which shows T1 and T2 hypointensity as well as
diffuse contrast enhancement. This measures 2.6 x 2.5 cm on image
[DATE], mildly increased in size from 2.3 x 2.2 cm on prior exam. No
other solid or enhancing masses are seen. Multiple bilateral renal
cysts are again seen, which show no significant change. No evidence
of hydronephrosis.

Stomach/Bowel: Visualized portion unremarkable.

Vascular/Lymphatic: No pathologically enlarged lymph nodes
identified. No evidence of renal vein or IVC thrombus. No abdominal
aortic aneurysm.

Other:  None.

Musculoskeletal:  No suspicious bone lesions identified.
IMPRESSION: Mild increase in size of 2.6 cm solid left renal mass, consistent
with renal cell carcinoma.

No evidence of abdominal metastatic disease.

Stable small cystic pancreatic lesions, largest in uncinate process
measuring 1.2 cm. Recommend continued follow-up by MRI in 1 year.

## 2019-10-18 ENCOUNTER — Ambulatory Visit: Payer: Medicare Other | Admitting: Podiatry

## 2019-10-22 ENCOUNTER — Encounter: Payer: Self-pay | Admitting: Family Medicine

## 2019-10-23 ENCOUNTER — Ambulatory Visit: Payer: Medicare PPO | Attending: Internal Medicine

## 2019-10-23 DIAGNOSIS — Z23 Encounter for immunization: Secondary | ICD-10-CM | POA: Insufficient documentation

## 2019-11-11 ENCOUNTER — Ambulatory Visit: Payer: Medicare PPO | Attending: Internal Medicine

## 2019-11-11 DIAGNOSIS — Z23 Encounter for immunization: Secondary | ICD-10-CM

## 2019-11-11 NOTE — Progress Notes (Signed)
   Covid-19 Vaccination [facility]  Name:  Pamela Patton    MRN: [id number] DOB: 09-22-1946  11/11/2019  [name] was observed post Covid-19 immunization for 15 minutes without incidence. She was provided with Vaccine Information Sheet and instruction to access the V-Safe system.   [name] was instructed to call 911 with any severe reactions post vaccine: Marland Kitchen Difficulty breathing  . Swelling of your face and throat  . A fast heartbeat  . A bad rash all over your body  . Dizziness and weakness    Immunizations Administered    Name Date Dose VIS Date Route   Pfizer COVID-19 Vaccine 11/11/2019  1:23 PM 0.3 mL 09/13/2019 Intramuscular   Manufacturer: [place]   Lot: [id number]   [place]: [id number]

## 2019-11-22 ENCOUNTER — Other Ambulatory Visit: Payer: Self-pay

## 2019-11-25 ENCOUNTER — Encounter: Payer: Self-pay | Admitting: Family Medicine

## 2019-11-25 ENCOUNTER — Ambulatory Visit (INDEPENDENT_AMBULATORY_CARE_PROVIDER_SITE_OTHER): Payer: Medicare PPO | Admitting: Family Medicine

## 2019-11-25 ENCOUNTER — Other Ambulatory Visit: Payer: Self-pay

## 2019-11-25 VITALS — BP 122/60 | HR 66 | Temp 97.1°F | Ht 65.5 in | Wt 146.7 lb

## 2019-11-25 DIAGNOSIS — E1169 Type 2 diabetes mellitus with other specified complication: Secondary | ICD-10-CM

## 2019-11-25 DIAGNOSIS — E1159 Type 2 diabetes mellitus with other circulatory complications: Secondary | ICD-10-CM

## 2019-11-25 DIAGNOSIS — E538 Deficiency of other specified B group vitamins: Secondary | ICD-10-CM | POA: Diagnosis not present

## 2019-11-25 DIAGNOSIS — E1129 Type 2 diabetes mellitus with other diabetic kidney complication: Secondary | ICD-10-CM | POA: Diagnosis not present

## 2019-11-25 DIAGNOSIS — C642 Malignant neoplasm of left kidney, except renal pelvis: Secondary | ICD-10-CM

## 2019-11-25 DIAGNOSIS — E559 Vitamin D deficiency, unspecified: Secondary | ICD-10-CM | POA: Diagnosis not present

## 2019-11-25 DIAGNOSIS — I152 Hypertension secondary to endocrine disorders: Secondary | ICD-10-CM

## 2019-11-25 DIAGNOSIS — G20 Parkinson's disease: Secondary | ICD-10-CM | POA: Diagnosis not present

## 2019-11-25 DIAGNOSIS — E890 Postprocedural hypothyroidism: Secondary | ICD-10-CM

## 2019-11-25 DIAGNOSIS — I10 Essential (primary) hypertension: Secondary | ICD-10-CM

## 2019-11-25 DIAGNOSIS — E785 Hyperlipidemia, unspecified: Secondary | ICD-10-CM

## 2019-11-25 DIAGNOSIS — Z794 Long term (current) use of insulin: Secondary | ICD-10-CM

## 2019-11-25 NOTE — Progress Notes (Signed)
Pamela Patton DOB: [birth year] Encounter date: 11/25/2019  This is a 74 y.o. female who presents with Chief Complaint  Patient presents with  . Follow-up    History of present illness: Pandemic is getting to her. Mood up and down.   Talking with integrative med doc to help her through with chronic conditions, etc.   Not sleeping as well. When she does sleep doesn't feel like it is as restful as it used to be. Exercise does help with sleep. Was doing this more until gyms were closed. Tries to get out and walk even when weather bad seems to help 25-30 minutes.   Parkinsons: follows with Dr. Carles Collet. On carbidopa/levodopa 25/100 2 tabs TID.   DMII: following with Dr. Elyse Hsu. Last A1C 8 from 09/03/2019. Have been "good and bad". Messy recently. Thinks related to stress. Not eating as much, but feels like she is running higher. Fingertips are hurting.   HTN: She is still taking hctz and losartan 50mg . CMP and thyroid at that time normal.  Hypothyroid: follows with Dr. Elyse Hsu. On levothyroxine.  CKD/Renal mass: follows with Dr. Louis Meckel. Repeat imaging 06/2019 showed mild increase in size of 2.6cm solid left renal mass; consistent with renal cell carcinoma. Per urology notes 06/2019 they will observe for 1 year as patient did not wish to start treatment during pandemic and plan for repeat imaging 06/2020.  HL: lipitor 40mg  daily. Lipids stable when checked in may (in care everywhere; reviewed today).  She does have a good support system. Sisters are further away. Son in [place]. In contact with them, but hasn't seen them.    No Known Allergies Current Meds  Medication Sig  . aspirin 81 MG tablet Take 81 mg by mouth daily with lunch.   Marland Kitchen atorvastatin (LIPITOR) 40 MG tablet Take 40 mg by mouth daily with lunch.   . Blood Pressure Monitoring (BLOOD PRESSURE CUFF) MISC 1 Product by Does not apply route as needed.  . Calcium Carbonate-Vitamin D (CALTRATE 600+D) 600-400 MG-UNIT per tablet Take 1  tablet by mouth 3 (three) times a week.   . carbidopa-levodopa (SINEMET IR) 25-100 MG tablet TAKE 1 & 1/2 TABLETS BY MOUTH 3 TIMES DAILY  . Cholecalciferol (VITAMIN D PO) Take 5,000 Units by mouth 3 (three) times a week.   . Cyanocobalamin (VITAMIN B12 PO) Take 1 tablet by mouth daily.  Marland Kitchen glucose 4 GM chewable tablet Chew 1 tablet by mouth as needed for low blood sugar.  Marland Kitchen HUMALOG KWIKPEN 100 UNIT/ML KiwkPen Inject 0-4 Units into the skin 3 (three) times daily with meals. Sliding scale per Dr Altheimer's office notes from 09/24/2019  . hydrochlorothiazide (HYDRODIURIL) 25 MG tablet Take 25 mg by mouth daily with lunch.   . insulin glargine (LANTUS) 100 UNIT/ML injection Inject 15 Units into the skin every evening.   Marland Kitchen levothyroxine (SYNTHROID, LEVOTHROID) 75 MCG tablet Take 75 mcg by mouth daily.  Marland Kitchen losartan (COZAAR) 25 MG tablet TAKE 2 TABLETS BY MOUTH EVERY DAY  . metFORMIN (GLUCOPHAGE-XR) 500 MG 24 hr tablet Take 1,000 mg by mouth daily with breakfast.  . NONFORMULARY OR COMPOUNDED ITEM [facility]:  Antifungal - Terbinafine 3%, Fluconazole 2%, Tea Tree Oil 5%, Urea 10%, Ibuprofen 2% in DMSO Suspension #58ml. Apply to the affected nail(s) once (at bedtime) or twice daily.  . Nutritional Supplements (FEEDING SUPPLEMENT, GLUCERNA 1.2 CAL,) LIQD Place 237 mLs into feeding tube daily with breakfast.  . Pioglitazone HCl (ACTOS PO) Take 45 mg by mouth daily with lunch.   Marland Kitchen  Polyethyl Glycol-Propyl Glycol (SYSTANE) 0.4-0.3 % GEL ophthalmic gel Place 1 application into both eyes at bedtime as needed (FOR EYES).   Current Facility-Administered Medications for the 11/25/19 encounter (Office Visit) with Caren Macadam, MD  Medication  . 0.9 %  sodium chloride infusion    Review of Systems  Constitutional: Negative for chills, fatigue and fever.  Respiratory: Negative for cough, chest tightness, shortness of breath and wheezing.   Cardiovascular: Negative for chest pain, palpitations and  leg swelling.  Psychiatric/Behavioral: Positive for sleep disturbance. Negative for suicidal ideas. The patient is not nervous/anxious.        Mood does get depressed    Objective:  BP 122/60 (BP Location: Left Arm, Patient Position: Sitting, Cuff Size: Normal)   Pulse 66   Temp (!) 97.1 F (36.2 C) (Temporal)   Ht 5' 5.5" (1.664 m)   Wt 146 lb 11.2 oz (66.5 kg)   BMI 24.04 kg/m   Weight: 146 lb 11.2 oz (66.5 kg)   BP Readings from Last 3 Encounters:  11/25/19 122/60  07/09/19 (!) 128/56  07/02/19 136/73   Wt Readings from Last 3 Encounters:  11/25/19 146 lb 11.2 oz (66.5 kg)  07/09/19 145 lb 12.8 oz (66.1 kg)  07/02/19 145 lb 6.4 oz (66 kg)    Physical Exam Constitutional:      General: She is not in acute distress.    Appearance: She is well-developed.  HENT:     Head: Normocephalic and atraumatic.  Cardiovascular:     Rate and Rhythm: Normal rate and regular rhythm.     Heart sounds: Murmur present. Systolic murmur present with a grade of 2/6.  Pulmonary:     Effort: Pulmonary effort is normal.     Breath sounds: Normal breath sounds.  Abdominal:     General: Bowel sounds are normal. There is no distension.     Palpations: Abdomen is soft.     Tenderness: There is no abdominal tenderness. There is no guarding.  Skin:    General: Skin is warm and dry.     Comments: Sensory exam of the foot is normal, tested with the monofilament. Good pulses, no lesions or ulcers, good peripheral pulses.  Psychiatric:        Judgment: Judgment normal.     Assessment/Plan  1. Hypertension associated with diabetes ([place]) Well-controlled.  Continue current medication.  2. Type 2 diabetes mellitus with other diabetic kidney complication, with long-term current use of insulin ([place]) Follows up with endocrinology.  Sugars have been somewhat up-and-down, and she feels that discomfort was checking regularly holds her back from better control.  I encouraged her to look into coverage  for continuous glucometer.  Continue to work on healthy low carbohydrate eating and follow-up regularly with endocrinology. - [facility] DIABETES FOOT EXAM  3. Hyperlipidemia associated with type 2 diabetes mellitus ([place]) Has been well controlled on atorvastatin.  Continue current dose.  4. Hypothyroidism, postablative Has been well controlled on Synthroid.  Continue current dose.  Managed by endocrinology.  5. PD (Parkinson's disease) [facility]) Patient feels that symptoms are stable.  Currently on carbidopa levodopa.  Following with neurology.  We discussed working on daily exercise to help maintain mobility.  6. Renal cell carcinoma of left kidney [facility]) This is being managed by specialist.  She will repeat imaging in the fall to determine stability of current lesion.  7. Vitamin D deficiency Taking supplementation.  Continue with this.  8. B12 deficiency Supplementing.  Levels have been  stable.    Return in about 6 months (around 05/24/2020) for physical exam with JK; but AWV with HK in 3 months.    Micheline Rough, MD

## 2019-11-25 NOTE — Patient Instructions (Signed)
*  Talk with Dr. Carles Collet about possibility of twice daily dosing for the carbidopa/levodopa? Not sure if this is possible for you, but might help with difficulty you are having with day planning/mealplanning.   *Is continuous glucometer an option for you? Might help with finger tenderness.

## 2019-12-09 NOTE — Progress Notes (Signed)
Pamela Patton was seen today in follow up for Parkinsons disease.  My previous records were reviewed prior to todays visit as well as outside records available to me.  She met with our behavioral health social worker for short-term counseling after our last visit.  States "I went through a period of depression."  States that she is doing better now.  Sleep is also better, which has helped.  Pt denies falls.  Pt denies lightheadedness, near syncope.  She describes a few episodes of hallucinations out of sleep - states with one she woke up and felt that there was a mouse on her pillow and she opened her mouth and it was like it went in her mouth.  She knew though it was a hallucination.  She never has a daytime hallucination.   No episodes of RBD.   No lightheadedness/dizziness.  Feels that memory has been good. Saw her primary care physician on February 22.   Pt did get her covid vaccine and did pretty well with that.  Current prescribed movement disorder medications: Carbidopa/levodopa 25/100, 2 tablets 3 times per day   ALLERGIES:  No Known Allergies  CURRENT MEDICATIONS:  Outpatient Encounter Medications as of 12/10/2019  Medication Sig  . aspirin 81 MG tablet Take 81 mg by mouth daily with lunch.   Marland Kitchen atorvastatin (LIPITOR) 40 MG tablet Take 40 mg by mouth daily with lunch.   . Blood Pressure Monitoring (BLOOD PRESSURE CUFF) MISC 1 Product by Does not apply route as needed.  . Calcium Carbonate-Vitamin D (CALTRATE 600+D) 600-400 MG-UNIT per tablet Take 1 tablet by mouth 3 (three) times a week.   . carbidopa-levodopa (SINEMET IR) 25-100 MG tablet TAKE 1 & 1/2 TABLETS BY MOUTH 3 TIMES DAILY  . Cholecalciferol (VITAMIN D PO) Take 5,000 Units by mouth 3 (three) times a week.   . Cyanocobalamin (VITAMIN B12 PO) Take 1 tablet by mouth daily.  Marland Kitchen glucose 4 GM chewable tablet Chew 1 tablet by mouth as needed for low blood sugar.  Marland Kitchen HUMALOG KWIKPEN 100 UNIT/ML KiwkPen Inject 0-4 Units into the skin 3  (three) times daily with meals. Sliding scale per Dr Altheimer's office notes from 09/24/2019  . hydrochlorothiazide (HYDRODIURIL) 25 MG tablet Take 25 mg by mouth daily with lunch.   . insulin glargine (LANTUS) 100 UNIT/ML injection Inject 15 Units into the skin every evening.   Marland Kitchen levothyroxine (SYNTHROID, LEVOTHROID) 75 MCG tablet Take 75 mcg by mouth daily.  Marland Kitchen losartan (COZAAR) 25 MG tablet TAKE 2 TABLETS BY MOUTH EVERY DAY  . metFORMIN (GLUCOPHAGE-XR) 500 MG 24 hr tablet Take 1,000 mg by mouth daily with breakfast.  . NONFORMULARY OR COMPOUNDED ITEM [facility]:  Antifungal - Terbinafine 3%, Fluconazole 2%, Tea Tree Oil 5%, Urea 10%, Ibuprofen 2% in DMSO Suspension #66m. Apply to the affected nail(s) once (at bedtime) or twice daily.  . Nutritional Supplements (FEEDING SUPPLEMENT, GLUCERNA 1.2 CAL,) LIQD Place 237 mLs into feeding tube daily with breakfast.  . Pioglitazone HCl (ACTOS PO) Take 45 mg by mouth daily with lunch.   .Vladimir FasterGlycol-Propyl Glycol (SYSTANE) 0.4-0.3 % GEL ophthalmic gel Place 1 application into both eyes at bedtime as needed (FOR EYES).   Facility-Administered Encounter Medications as of 12/10/2019  Medication  . 0.9 %  sodium chloride infusion    PHYSICAL EXAMINATION:    VITALS:   Vitals:   12/10/19 1300  BP: 103/69  Pulse: 71  SpO2: 98%  Weight: 144 lb (65.3 kg)  Height: 5' 6"  (1.676 m)    GEN:  The patient appears stated age and is in NAD. HEENT:  Normocephalic, atraumatic.  The mucous membranes are moist. The superficial temporal arteries are without ropiness or tenderness. CV:  RRR Lungs:  CTAB Neck/HEME:  There are no carotid bruits bilaterally.  Neurological examination:  Orientation: The patient is alert and oriented x3. Cranial nerves: There is good facial symmetry with facial hypomimia. The speech is fluent and clear. Soft palate rises symmetrically and there is no tongue deviation. Hearing is intact to conversational  tone. Sensation: Sensation is intact to light touch throughout Motor: Strength is at least antigravity x4. Seen at  1:00 PM EST and last took medication at 8:15 am.  Due for carbidopa/levodopa 25/100.  Movement examination: Tone: There is normal tone in the UE/LE Abnormal movements: none Coordination:  There is mild decremation with RAM's, with finger and toe taps on the L Gait and Station: The patient has no difficulty arising out of a deep-seated chair without the use of the hands. The patient's stride length is good and good arm swing on the L.    I have reviewed and interpreted the following labs independently She had lab work on September 03, 2019.  Her hemoglobin A1c was 8.0.  Sodium was 143, potassium 4.7, chloride 105, CO2 27, BUN 21, creatinine 1.18, glucose 167, AST 16, ALT 16  ASSESSMENT/PLAN:  1.  Parkinson's disease -Continue carbidopa/levodopa 25/100, 2 tablets 3 times per day.  Watching nighttime hallucinations (upon awakening).    -We discussed that it used to be thought that levodopa would increase risk of melanoma but now it is believed that Parkinsons itself likely increases risk of melanoma. she is to get regular skin checks.    -encouraged exercise  -talked to her about neurocog testing.  She was agreeable.  We will schedule that.   2. Dysphagia -Last modified barium swallow was in January, 2018.  Regular diet with thin liquid is recommended.  Patient feels that she is doing good in that regard.    3. Low blood pressure -Much better after blood pressure medications were discontinued.  4. REM behavior disorder  -This is commonly associated with PD and the patient is experiencing this.  We discussed that this can be very serious and even harmful.  We talked about medications as well as physical barriers to put in the bed (particularly soft bed rails, pillow barriers).  We talked about moving the night stand so that it  is not so close to the side of the bed.  She does not want any medication right now because is doing well in that regard  5.constipation             -controlling with diet  6.  Depression  -states much better now.    -saw social work in the past, but would like to see her again.  Asks me to schedule another visit.  -talked with her about meds and patient states that she is feeling better now and wants to hold off on it right now    Total time spent on today's visit was 35 minutes, including both face-to-face time and nonface-to-face time.  Time included that spent on review of records (prior notes available to me/labs/imaging if pertinent), discussing treatment and goals, answering patient's questions and coordinating care.  Cc:  Caren Macadam, MD

## 2019-12-10 ENCOUNTER — Ambulatory Visit: Payer: Medicare PPO | Admitting: Neurology

## 2019-12-10 ENCOUNTER — Encounter: Payer: Self-pay | Admitting: Neurology

## 2019-12-10 ENCOUNTER — Other Ambulatory Visit: Payer: Self-pay

## 2019-12-10 VITALS — BP 103/69 | HR 71 | Ht 66.0 in | Wt 144.0 lb

## 2019-12-10 DIAGNOSIS — F330 Major depressive disorder, recurrent, mild: Secondary | ICD-10-CM | POA: Diagnosis not present

## 2019-12-10 DIAGNOSIS — G20 Parkinson's disease: Secondary | ICD-10-CM

## 2019-12-10 DIAGNOSIS — R413 Other amnesia: Secondary | ICD-10-CM | POA: Diagnosis not present

## 2019-12-10 MED ORDER — CARBIDOPA-LEVODOPA 25-100 MG PO TABS: 2.0000 | ORAL_TABLET | Freq: Three times a day (TID) | ORAL | 1 refills | Status: DC

## 2019-12-10 NOTE — Patient Instructions (Signed)
You have been referred for a neurocognitive evaluation in our office.   The evaluation has two parts.   . The first part of the evaluation is a clinical interview with the neuropsychologist (Dr. Merz or Dr. Stewart). Please bring someone with you to this appointment if possible, as it is helpful for the doctor to hear from both you and another adult who knows you well.   . The second part of the evaluation is testing with the doctor's technician (Dana or Kim). The testing includes a variety of tasks- mostly question-and-answer, some paper-and-pencil. There is nothing you need to do to prepare for this appointment, but having a good night's sleep prior to the testing, taking medications as you normally would, and bringing eyeglasses and hearing aids (if you wear them), is advised. Please make sure that you wear a mask to the appointment.  Please note: We have to reserve several hours of the neuropsychologist's time and the psychometrician's time for your evaluation appointment. As such, please note that there is a No-Show fee of $100. If you are unable to attend any of your appointments, please contact our office as soon as possible to reschedule.  

## 2019-12-24 DIAGNOSIS — E7800 Pure hypercholesterolemia, unspecified: Secondary | ICD-10-CM | POA: Diagnosis not present

## 2019-12-24 DIAGNOSIS — Z79899 Other long term (current) drug therapy: Secondary | ICD-10-CM | POA: Diagnosis not present

## 2019-12-24 DIAGNOSIS — E890 Postprocedural hypothyroidism: Secondary | ICD-10-CM | POA: Diagnosis not present

## 2019-12-24 DIAGNOSIS — E1165 Type 2 diabetes mellitus with hyperglycemia: Secondary | ICD-10-CM | POA: Diagnosis not present

## 2019-12-24 DIAGNOSIS — Z794 Long term (current) use of insulin: Secondary | ICD-10-CM | POA: Diagnosis not present

## 2019-12-24 DIAGNOSIS — E559 Vitamin D deficiency, unspecified: Secondary | ICD-10-CM | POA: Diagnosis not present

## 2019-12-25 DIAGNOSIS — N1831 Chronic kidney disease, stage 3a: Secondary | ICD-10-CM | POA: Diagnosis not present

## 2019-12-25 DIAGNOSIS — Z794 Long term (current) use of insulin: Secondary | ICD-10-CM | POA: Diagnosis not present

## 2019-12-25 DIAGNOSIS — E113293 Type 2 diabetes mellitus with mild nonproliferative diabetic retinopathy without macular edema, bilateral: Secondary | ICD-10-CM | POA: Diagnosis not present

## 2019-12-25 DIAGNOSIS — E1165 Type 2 diabetes mellitus with hyperglycemia: Secondary | ICD-10-CM | POA: Diagnosis not present

## 2019-12-25 DIAGNOSIS — E790 Hyperuricemia without signs of inflammatory arthritis and tophaceous disease: Secondary | ICD-10-CM | POA: Diagnosis not present

## 2019-12-25 DIAGNOSIS — E890 Postprocedural hypothyroidism: Secondary | ICD-10-CM | POA: Diagnosis not present

## 2019-12-25 DIAGNOSIS — E7800 Pure hypercholesterolemia, unspecified: Secondary | ICD-10-CM | POA: Diagnosis not present

## 2019-12-25 DIAGNOSIS — E1122 Type 2 diabetes mellitus with diabetic chronic kidney disease: Secondary | ICD-10-CM | POA: Diagnosis not present

## 2019-12-25 DIAGNOSIS — I129 Hypertensive chronic kidney disease with stage 1 through stage 4 chronic kidney disease, or unspecified chronic kidney disease: Secondary | ICD-10-CM | POA: Diagnosis not present

## 2020-01-24 ENCOUNTER — Ambulatory Visit: Payer: Medicare PPO | Admitting: Clinical

## 2020-01-24 NOTE — Clinical Note (Incomplete)
Organizing clothing Sister going to come visit  Feeling better with better weather  30 minute walk 4 family members died from covid   Eating less sweets, eating earlier around 6pm for dinner-thinks its helping with sleep [employer]  hub near her appt keeps her awake Trying to increase water  No more hallucination/dreams

## 2020-02-03 DIAGNOSIS — H524 Presbyopia: Secondary | ICD-10-CM | POA: Diagnosis not present

## 2020-02-03 DIAGNOSIS — H40013 Open angle with borderline findings, low risk, bilateral: Secondary | ICD-10-CM | POA: Diagnosis not present

## 2020-02-03 DIAGNOSIS — H04123 Dry eye syndrome of bilateral lacrimal glands: Secondary | ICD-10-CM | POA: Diagnosis not present

## 2020-02-03 DIAGNOSIS — H16223 Keratoconjunctivitis sicca, not specified as Sjogren's, bilateral: Secondary | ICD-10-CM | POA: Diagnosis not present

## 2020-03-18 ENCOUNTER — Ambulatory Visit: Payer: Medicare PPO

## 2020-03-18 ENCOUNTER — Ambulatory Visit (INDEPENDENT_AMBULATORY_CARE_PROVIDER_SITE_OTHER): Payer: Medicare PPO | Admitting: Psychology

## 2020-03-18 ENCOUNTER — Encounter: Payer: Self-pay | Admitting: Psychology

## 2020-03-18 ENCOUNTER — Other Ambulatory Visit: Payer: Self-pay

## 2020-03-18 DIAGNOSIS — G3184 Mild cognitive impairment, so stated: Secondary | ICD-10-CM | POA: Diagnosis not present

## 2020-03-18 DIAGNOSIS — G20 Parkinson's disease: Secondary | ICD-10-CM

## 2020-03-18 DIAGNOSIS — F329 Major depressive disorder, single episode, unspecified: Secondary | ICD-10-CM | POA: Diagnosis not present

## 2020-03-18 HISTORY — DX: Mild cognitive impairment of uncertain or unknown etiology: G31.84

## 2020-03-18 NOTE — Progress Notes (Signed)
   Psychometrician Note   Cognitive testing was administered to Pamela Patton by Cruzita Lederer, B.S. (psychometrist) under the supervision of Dr. Christia Reading, Ph.D., licensed psychologist on 03/18/2020. Pamela Patton did not appear overtly distressed by the testing session per behavioral observation or responses across self-report questionnaires. Dr. Christia Reading, Ph.D. checked in with Pamela Patton as needed to manage any distress related to testing procedures (if applicable). Rest breaks were offered.    The battery of tests administered was selected by Dr. Christia Reading, Ph.D. with consideration to Pamela Patton's current level of functioning, the nature of her symptoms, emotional and behavioral responses during interview, level of literacy, observed level of motivation/effort, and the nature of the referral question. This battery was communicated to the psychometrist. Communication between Dr. Christia Reading, Ph.D. and the psychometrist was ongoing throughout the evaluation and Dr. Christia Reading, Ph.D. was immediately accessible at all times. Dr. Christia Reading, Ph.D. provided supervision to the psychometrist on the date of this service to the extent necessary to assure the quality of all services provided.    Pamela Patton will return within approximately 1-2 weeks for an interactive feedback session with Dr. Melvyn Novas at which time her test performances, clinical impressions, and treatment recommendations will be reviewed in detail. Pamela Patton understands she can contact our office should she require our assistance before this time.  A total of 210 minutes of billable time were spent face-to-face with Pamela Patton by the psychometrist. This includes both test administration and scoring time. Billing for these services is reflected in the clinical report generated by Dr. Christia Reading, Ph.D..  This note reflects time spent with the psychometrician and does not include test scores or any clinical  interpretations made by Dr. Melvyn Novas. The full report will follow in a separate note.

## 2020-03-18 NOTE — Patient Instructions (Signed)
Clinical Impression(s): Overall, Dr. Sarita Bottom' pattern of performance is suggestive of weaknesses across processing speed, executive functioning (including working memory), visual encoding (i.e., learning), and retrieval aspects of both verbal and visual memory. Mild variability was noted across expressive language and visuospatial tasks, with a majority of performances scoring within normal limits. Performances across basic attention, receptive language, verbal encoding, and consolidation aspects of both verbal and visual memory were within normal limits. Dr. Sarita Bottom denied difficulties completing instrumental activities of daily living (ADLs) independently. As such, given evidence for cognitive dysfunction described above, she meets criteria for a Mild Neurocognitive Disorder (formerly "mild cognitive impairment") at the present time. However, the mild nature of cognitive weaknesses should be emphasized at present.   Regarding etiology, her pattern of performance is largely consistent with what would be expected in Parkinson's disease. As such, this likely represents the primary culprit for cognitive changes. Executive dysfunction is also common in Lewy body dementia. However, despite the presence of REM sleep behaviors, hallucinations do not appear fully formed (or fully verified that she is awake during their occurrence and they do not better represent vivid dreaming). She also denied fluctuations in alertness, demonstrated generally intact visuospatial functioning, and noted that levodopa medications seemed to be beneficial, making this diagnosis less likely at the present time. It is also worth noting that performances would be consistent with a primary vascular etiology and her medical history includes hypertension, hyperlipidemia, type II diabetes, and carotid artery disease. Neuroimaging was not available for my review to assess the presence of smell vessel ischemic changes. However, cognitive deficits due  to cardiovascular changes should remain on her differential. Specific to memory, despite some trouble with retrieving information from memory storage, her performance was strong across recognition trials and is not suggestive of a memory storage deficit. Overall, her performance is not suggestive of Alzheimer's disease at the present time. Continued medical monitoring will be important moving forward.

## 2020-03-18 NOTE — Progress Notes (Signed)
NEUROPSYCHOLOGICAL EVALUATION [place] of Neurology  Date of Evaluation: March 18, 2020  Reason for Referral:   Pamela Patton is a 74 y.o. left-handed African-American female referred by [name], D.O., to characterize her current cognitive functioning and assist with diagnostic clarity and treatment planning in the context of subjective cognitive decline and a history of Parkinson's disease.  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 Mild variability was noted across expressive language and visuospatial tasks, with a majority of performances scoring within normal limits. Performances across basic attention, receptive language, verbal encoding, and consolidation aspects of both verbal and visual memory were within normal limits. Dr. Sarita Bottom denied difficulties completing instrumental activities of daily living (ADLs) independently. As such, given evidence for cognitive dysfunction described above, she meets criteria for a Mild Neurocognitive Disorder (formerly "mild cognitive impairment") at the present time. However, the mild nature of cognitive weaknesses should be emphasized at present.   Regarding etiology, her pattern of performance is largely consistent with what would be expected in Parkinson's disease. As such, this likely represents the primary culprit for cognitive changes. Executive dysfunction is also common in Lewy body dementia. However, despite the presence of REM sleep behaviors, hallucinations do not appear fully formed (or fully verified that she is awake during their occurrence and they do not better represent vivid dreaming). She also denied fluctuations in alertness, demonstrated generally intact  visuospatial functioning, and noted that levodopa medications seemed to be beneficial, making this diagnosis less likely at the present time. It is also worth noting that performances would be consistent with a primary vascular etiology and her medical history includes hypertension, hyperlipidemia, type II diabetes, and carotid artery disease. Neuroimaging was not available for my review to assess the presence of smell vessel ischemic changes. However, cognitive deficits due to cardiovascular changes should remain on her differential. Specific to memory, despite some trouble with retrieving information from memory storage, her performance was strong across recognition trials and is not suggestive of a memory storage deficit. Overall, her performance is not suggestive of Alzheimer's disease at the present time. Continued medical monitoring will be important moving forward.   Recommendations: A repeat neuropsychological evaluation in 18-24 months (or sooner if functional decline is noted) is recommended to assess the trajectory of future cognitive decline should it occur. This will also aid in future efforts towards improved diagnostic clarity.  I could not find any neuroimaging available for my review. If this has not been previously performed, imaging in the form of a brain MRI would be beneficial to establish an anatomical baseline to compare any future changes (should they occur) against, while also looking for any anatomical correlates of current cognitive weaknesses.  Dr. Sarita Bottom would also benefit from practicing good sleep hygiene. These practices can include:   -Going to bed and waking up at the same time each day   -Using relaxation strategies before bed   -Doing a quiet activity until drowsy if unable to fall asleep within 20 minutes rather than watching television   -Using the bed only for sleeping (i.e., no television watching or other activities)   -Creating a good sleeping environment (e.g.,  dark enough, quiet enough, right temperature)   -Engaging in regular aerobic exercise  Dr. Sarita Bottom is encouraged to attend to lifestyle factors  for brain health (e.g., regular physical exercise, good nutrition habits, regular participation in cognitively-stimulating activities, and general stress management techniques), which are likely to have benefits for both emotional adjustment and cognition. In fact, in addition to promoting good general health, regular exercise incorporating aerobic activities (e.g., brisk walking, jogging, cycling, etc.) has been demonstrated to be a very effective treatment for depression and stress, with similar efficacy rates to both antidepressant medication and psychotherapy. Optimal control of vascular risk factors (including safe cardiovascular exercise and adherence to dietary recommendations) is encouraged. Continued participation in activities which provide mental stimulation and social interaction is also recommended.  If interested, there are some activities which have therapeutic value and can be useful in keeping her cognitively stimulated. For suggestions, Dr. Sarita Bottom is encouraged to go to the following website: https://www.[facility].org/get-to-know-barrow/centers-programs/neurorehabilitation-center/neuro-rehab-apps-and-games/ which has options, categorized by level of difficulty. It should be noted that these activities should not be viewed as a substitute for therapy.  For day-to-day problems recalling information, she may benefit from using strategies to aid with her learning and memory, such as asking questions for clarification, requesting that information to be repeated, or repeating an explanation in her own words to ensure comprehension and promote encoding.    Memory can be improved using internal strategies such as rehearsal, repetition, chunking, mnemonics, association, and imagery. External strategies such as written notes in a consistently used memory  journal, visual and nonverbal auditory cues such as a calendar on the refrigerator or appointments with alarm, such as on a cell phone, can also help maximize recall.    To address problems with processing speed, she may wish to consider:   -Ensuring that she is alerted when essential material or instructions are being presented   -Adjusting the speed at which new information is presented   -Allowing for more time in comprehending, processing, and responding in conversation  To address problems with executive dysfunction, she may wish to consider:   -Avoiding external distractions when needing to concentrate   -Limiting exposure to fast paced environments with multiple sensory demands   -Writing down complicated information and using checklists   -Attempting and completing one task at a time (i.e., no multi-tasking)   -Verbalizing aloud each step of a task to maintain focus    -Reducing the amount of information considered at one time  Review of Records:   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 She recently met with a behavioral health social worker for short-term counseling. Dr. Sarita Bottom stated that she went through a "period of depression;" however, symptoms were largely denied when meeting with Dr. Carles Collet recently. She denied the presence of lightheadedness, near syncope, or recent falls. She did describe a few episodes of hallucinations out of sleep. For example, she described one instance where she woke up and felt that there was a mouse on her pillow. When she opened her mouth, it appeared that mouse went into her mouth. She reporting knowing that this was a hallucination and daytime experiences were denied. She currently takes carbidopa/levodopa 25/100 2 tablets 3 times per day. Mild decremation with RAM's with finger and toe taps on the left side was noted. Dr. Carles Collet also reported prior experiences of REM sleep behaviors.  Ultimately, Dr. Sarita Bottom was referred for a comprehensive neuropsychological evaluation to characterize her cognitive abilities and to assist with diagnostic clarity and treatment planning.   No neuroimaging was available for review.   Past Medical History:  Diagnosis  Date  . Anemia   . Arthritis   . 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 Carotid artery disease 03/21/2017  . Cataract    immature   . Chest pain summer 2012  . Chicken pox   . CKD (chronic kidney disease) 10/08/2012   Stage 3, GFR 30-59 ml/min  . Diverticulosis    difficulty with colonoscopy 2005  . DM (diabetes mellitus), type 2 with renal complications 49/67/5916  . Fracture    radial/ulnar 03/2016  . GERD (gastroesophageal reflux disease)   . Heart murmur    innocent murmur  . High cholesterol   . Hypertension associated with diabetes 02/22/2017  . Hyperuricemia 10/31/2016  . Hypothyroidism    s/p radioactive iodine tx for graves in 2001  . Intestinal metaplasia of gastric mucosa   . Parkinson's disease 08/08/2016  . UTI (urinary tract infection)   . Vitamin D deficiency 08/24/2012  . Weight loss    eval with GI in 2016; s/p CT/MRI and PET scans    Past Surgical History:  Procedure Laterality Date  . ABDOMINAL HYSTERECTOMY  1989  . CATARACT EXTRACTION, BILATERAL    . COLONOSCOPY    . DG BARIUM SWALLOW ([facility] HX)     9 yrs ago per pt due to incomplete colonsocopy    Current Outpatient Medications:  .  aspirin 81 MG tablet, Take 81 mg by mouth daily with lunch. , Disp: , Rfl:  .  atorvastatin (LIPITOR) 40 MG tablet, Take 40 mg by mouth daily with lunch. , Disp: , Rfl:  .  Blood Pressure Monitoring (BLOOD PRESSURE CUFF) MISC, 1 Product by Does not apply route as needed., Disp: 1 each, Rfl: 0 .  Calcium Carbonate-Vitamin D (CALTRATE 600+D) 600-400 MG-UNIT per tablet, Take 1 tablet by mouth 3 (three) times a week. , Disp: , Rfl:  .  carbidopa-levodopa (SINEMET IR) 25-100 MG tablet, Take 2 tablets by mouth 3  (three) times daily., Disp: 540 tablet, Rfl: 1 .  Cholecalciferol (VITAMIN D PO), Take 5,000 Units by mouth 3 (three) times a week. , Disp: , Rfl:  .  Cyanocobalamin (VITAMIN B12 PO), Take 1 tablet by mouth daily., Disp: , Rfl:  .  glucose 4 GM chewable tablet, Chew 1 tablet by mouth as needed for low blood sugar., Disp: , Rfl:  .  HUMALOG KWIKPEN 100 UNIT/ML KiwkPen, Inject 0-4 Units into the skin 3 (three) times daily with meals. Sliding scale per Dr Altheimer's office notes from 09/24/2019, Disp: , Rfl:  .  hydrochlorothiazide (HYDRODIURIL) 25 MG tablet, Take 25 mg by mouth daily with lunch. , Disp: , Rfl:  .  insulin glargine (LANTUS) 100 UNIT/ML injection, Inject 15 Units into the skin every evening. , Disp: , Rfl:  .  levothyroxine (SYNTHROID, LEVOTHROID) 75 MCG tablet, Take 75 mcg by mouth daily., Disp: , Rfl:  .  losartan (COZAAR) 25 MG tablet, TAKE 2 TABLETS BY MOUTH EVERY DAY, Disp: 180 tablet, Rfl: 1 .  metFORMIN (GLUCOPHAGE-XR) 500 MG 24 hr tablet, Take 1,000 mg by mouth daily with breakfast., Disp: , Rfl:  .  NONFORMULARY OR COMPOUNDED ITEM, [facility]:  Antifungal - Terbinafine 3%, Fluconazole 2%, Tea Tree Oil 5%, Urea 10%, Ibuprofen 2% in DMSO Suspension #14m. Apply to the affected nail(s) once (at bedtime) or twice daily., Disp: 30 each, Rfl: 5 .  Nutritional Supplements (FEEDING SUPPLEMENT, GLUCERNA 1.2 CAL,) LIQD, Place 237 mLs into feeding tube daily with breakfast., Disp: , Rfl:  .  Pioglitazone HCl (ACTOS  PO), Take 45 mg by mouth daily with lunch. , Disp: , Rfl:  .  Polyethyl Glycol-Propyl Glycol (SYSTANE) 0.4-0.3 % GEL ophthalmic gel, Place 1 application into both eyes at bedtime as needed (FOR EYES)., Disp: , Rfl:   Current Facility-Administered Medications:  .  0.9 %  sodium chloride infusion, 500 mL, Intravenous, Continuous, Armbruster, Carlota Raspberry, MD  Clinical Interview:   Cognitive Symptoms: Decreased short-term memory: Endorsed. She acknowledged some mild  difficulties with short-term memory. These were described as generalized in nature, but did include more specific occasional difficulties remembering the details of previous conversations and misplacing things. Overall, she felt that memory changes were largely age-appropriate and similar to her peers.  Decreased long-term memory: Denied. Decreased attention/concentration: Denied. However, she did allude to infrequent instances of losing her train of thought.  Reduced processing speed: Endorsed. Difficulties with executive functions: Denied. Impulsivity and personality changes were also denied.  Difficulties with emotion regulation: Denied. Difficulties with receptive language: Denied. Difficulties with word finding: Endorsed. She noted that her speech has slowed down and that her sentences are sometimes "fractured" while speaking.  Decreased visuoperceptual ability: Denied.  Trajectory of deficits: Per her perception, cognitive difficulties were largely described as age-appropriate, making a time of decline difficult to determine.   Difficulties completing ADLs: Denied. She reported completing a driving evaluation in the recent past, which she passed.   Additional Medical History: History of traumatic brain injury/concussion: Denied. History of stroke: Denied. History of seizure activity: Denied. History of known exposure to toxins: Denied. 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 She reported always being very careful and denied any instances where she was exposed to dangerous levels of this chemical to her knowledge.  Symptoms of chronic pain: Denied. Experience of frequent headaches/migraines: Denied. Frequent instances of dizziness/vertigo: Denied.  Sensory changes: She has had cataracts removed and currently wears reading glasses with positive effect. She wondered about mild hearing loss but has not had a formal evaluation completed.  Other sensory changes/difficulties (e.g., taste or smell) were denied.  Balance/coordination difficulties: Largely denied. She described her balance as feeling "fine" and noted being able to navigate up and down stairs without issue (outside of being conscious of her need to be extra careful). She reported a single remote fall caused by her tripping over a rug in her home. No other falls were reported.  Other motor difficulties: She reported a very mild tremor, predominantly in her left hand.   Sleep History: Estimated hours obtained each night: Unsure. She described her sleep as "awful" but was unable to provide a numerical estimation.  Difficulties falling asleep: Denied. Difficulties staying asleep: Endorsed. Sleep was described as broken in nature where she will often wake up throughout the night. However, sleep has improved lately as she has begun to sleep without the television on.  Feels rested and refreshed upon awakening: Variably so depending on how much sleep she is able to obtain the night before.   History of snoring: "I suspect so." History of waking up gasping for air: Denied. Witnessed breath cessation while asleep: Denied.  History of vivid dreaming: Endorsed.  Excessive movement while asleep: Endorsed. Instances of acting out her dreams: Endorsed. She acknowledged a history of vivid dreaming, including some instances where she has acted out these dreams. In particular, she described a recent dream where her sister had been bitten by a snake and required the toxins to be removed. Ms. Wahab woke up in  the process of removing these toxins, remarking that she had been acting out the process.  Psychiatric/Behavioral Health History: Depression: Endorsed. She reported going through a "period of depression" during the early part of 2021, largely attributed to the ongoing COVID-19 pandemic. She described periods of isolation, being by herself, and having to attend church services on Zoom,  which limited her ability to socialize. Overall, symptoms were described as mild and have improved over time. Current or remote suicidal ideation, intent, or plan was denied.  Anxiety: Denied. Mania: Denied. Trauma History: Denied. Visual/auditory hallucinations: Endorsed. She reported the presence of predominantly nighttime visual hallucinations. However, it is somewhat unclear if these are better characterized as vivid dreams rather than active hallucinations. For example, she described one instance where she saw a mouse on her pillow, with the mouse entering her mouth as she opened it to scream. It was unclear if this occurred while asleep or awake. She also described an instance or two where she feels she sees an individual in her room. These are said to not be overly distressing and she is aware that what she sees is not real.  Delusional thoughts: Denied.  Tobacco: Denied. Alcohol: She denied current alcohol consumption as well as a history of problematic alcohol abuse or dependence.  Recreational drugs: Denied. Caffeine: 1 cup of coffee per week, as well as an occasional tea.   Family History: Problem Relation Age of Onset  . Cancer Mother        liver  . Liver cancer Mother   . Stroke Father 41  . Diabetes Father   . Hypertension Father   . Diabetes Maternal Grandmother   . Prostate cancer Maternal Grandfather   . Pancreatic cancer Brother   . Diabetes Sister   . Cancer Sister   . Diabetes Sister   . Colon polyps Neg Hx   . Colon cancer Neg Hx   . Esophageal cancer Neg Hx   . Rectal cancer Neg Hx   . Stomach cancer Neg Hx    This information was confirmed by Dr. Sarita Bottom.  Academic/Vocational History: Highest level of educational attainment: 20 years. 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 She denied relative weaknesses across academic subjects.  History of developmental delay: Denied. History  of grade repetition: Denied. History of class failures: Denied. Enrollment in special education courses: Denied. History of LD/ADHD: Denied.  Employment: Retired. 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   Evaluation Results:   Behavioral Observations: Dr. Sarita Bottom was unaccompanied, arrived to her appointment on time, and was appropriately dressed and groomed. She appeared alert and oriented. She ambulated slowly but frank instability was not observed. Gross motor functioning appeared intact upon informal observation and no abnormal movements (e.g., tremors) were noted outside of one instance where she held up her left hand in order to demonstrate mild tremulous behaviors. Her affect was generally relaxed and positive, but did range appropriately given the subject being discussed during the clinical interview or the task at hand during testing procedures. Rate of speech was slowed. However, spontaneous speech was fluent and word finding difficulties were not observed during the clinical interview or testing procedures. Thought processes were tangential at times, but overall coherent and normal in content. Insight into her cognitive difficulties appeared adequate. However, objective deficits were noted above and beyond age-related changes  as previously suspected by Dr. Sarita Bottom. During testing, sustained attention was appropriate. Task engagement was adequate and she persisted when challenged. Overall, Dr. Sarita Bottom was cooperative with the clinical interview and subsequent testing procedures.   Adequacy of Effort: The validity of neuropsychological testing is limited by the extent to which the individual being tested may be assumed to have exerted adequate effort during testing. Dr. Sarita Bottom expressed her intention to perform to the best of her abilities and exhibited adequate task engagement and  persistence. Scores across stand-alone and embedded performance validity measures were within expectation. As such, the results of the current evaluation are believed to be a valid representation of Dr. Sarita Bottom' current cognitive functioning.  Test Results: Dr. Sarita Bottom was fully oriented at the time of the current evaluation.  Intellectual abilities based upon educational and vocational attainment were estimated to be in the above average range. Premorbid abilities were estimated to be within the above average range based upon a single-word reading test.   Processing speed was mildly variable, scoring in the below average to above average normative ranges. Basic attention was average. More complex attention (e.g., working memory) was below average. Executive functioning was well below average to average, representing a relative weakness across the current evaluation.  Assessed receptive language abilities were above average. Likewise, Dr. Sarita Bottom did not exhibit any difficulties comprehending task instructions and answered all questions asked of her appropriately. Sentence repetition was above average, while a semantic knowledge screening test was within expectation. Assessed expressive language was variable. Phonemic fluency was below average, semantic fluency was exceptionally high, and confrontation naming was average.     Assessed visuospatial/visuoconstructional abilities were below average to above average.    Learning (i.e., encoding) of novel information was average to above average across verbal tasks and well below average across a visual task. Spontaneous delayed recall (i.e., retrieval) of previously learned information was exceptionally low to average. Retention rates were 54% across a story learning task, 0% across a list learning task, and 25% across a shape learning task. Performance across recognition tasks was appropriate, suggesting evidence for information consolidation.   Results of  emotional screening instruments suggested that recent symptoms of generalized anxiety were in the mild range, while symptoms of depression were also within the mild range. A screening instrument assessing recent sleep quality suggested the presence of minimal sleep dysfunction. Responses across a Parkinson's disease symptom scale suggested primary areas of difficulty surrounding social support and cognitive dysfunction.   Tables of Scores:   Note: This summary of test scores accompanies the interpretive report and should not be considered in isolation without reference to the appropriate sections in the text. Descriptors are based on appropriate normative data and may be adjusted based on clinical judgment. The terms "impaired" and "within normal limits (WNL)" are used when a more specific level of functioning cannot be determined.       Effort Testing:   DESCRIPTOR       Dot Counting Test: --- --- Within Expectation  HVLT-R Recognition Discrimination Index: --- --- Within Expectation  D-KEFS Color Word Effort Index: --- --- Within Expectation       Orientation:      Raw Score Percentile   NAB Orientation, Form 1 29/29 --- ---       Intellectual Functioning:           Standard Score Percentile   Test of Premorbid Functioning: 916 38 Above Average       Memory:  Wechsler Memory Scale (WMS-IV):                       Raw Score (Scaled Score) Percentile     Logical Memory I 37/53 (13) 84 Above Average    Logical Memory II 13/39 (9) 37 Average    Logical Memory Recognition 20/23 >75 Above Average       Hopkins Verbal Learning Test (HVLT-R), Form 1: Raw Score (T Score) Percentile     Total Trials 1-3 25/36 (54) 66 Average    Delayed Recall 0/12 (19) <1 Exceptionally Low    Recognition Discrimination Index 10 (48) 42 Average      True Positives 12 --- ---      False Positives 2 --- ---        Brief Visuospatial Memory Test (BVMT-R), Form 1: Raw Score (T Score) Percentile      Total Trials 1-3 12/36 (36) 8 Well Below Average    Delayed Recall 1/12 (23) <1 Exceptionally Low    Recognition Discrimination Index 5 >16 Within Normal Limits      Recognition Hits 5/6 >16 Within Normal Limits      False Positive Errors 0 >16 Within Normal Limits        Attention/Executive Function:          Trail Making Test (TMT): Raw Score (T Score) Percentile     Part A 44 secs.,  1 error (47) 38 Average    Part B 210 secs.,  4 errors (39) 14 Below Average        Symbol Digit Modalities Test (SDMT): Raw Score (Z-Score) Percentile     Oral 37 (-1.17) 12 Below Average       NAB Attention Module, Form 1: T Score Percentile     Digits Forward 55 69 Average    Digits Backwards 40 16 Below Average       D-KEFS Color-Word Interference Test: Raw Score (Scaled Score) Percentile     Color Naming 28 secs. (12) 75 Above Average    Word Reading 25 secs. (10) 50 Average    Inhibition 72 secs. (10) 50 Average      Total Errors 3 errors (10) 50 Average    Inhibition/Switching 101 secs. (7) 16 Below Average      Total Errors 4 errors (9) 37 Average       D-KEFS 20 Questions Test: Scaled Score Percentile     Total Weighted Achievement Score 5 5 Well Below Average    Initial Abstraction Score 8 25 Average       [place] Card Sorting Test: Raw Score Percentile     Categories (trials) 1 (64) 11-16 Below Average    Total Errors 29 16 Below Average    Perseverative Errors 24 2 Well Below Average    Non-Perseverative Errors 5 98 Exceptionally High    Failure to Maintain Set 1 --- ---       Language:           Raw Score Percentile   PPVT Screening Instrument: 16/16 --- Within Expectation  Sentence Repetition: 17/22 89 Above Average       Verbal Fluency Test: Raw Score (T Score) Percentile     Phonemic Fluency (FAS) 28 (40) 16 Below Average    Animal Fluency 27 (70) 98 Exceptionally High        NAB Language Module, Form 2: T Score Percentile     Auditory Comprehension 57 75 Above  Average  Naming 29/31 (49) 46 Average       Visuospatial/Visuoconstruction:      Raw Score Percentile   Clock Drawing: 10/10 --- Within Normal Limits       NAB Spatial Module, Form 2: T Score Percentile     Visual Discrimination 41 18 Below Average        Scaled Score Percentile   WAIS-IV Matrix Reasoning: 12 75 Above Average  WAIS-IV Visual Puzzles: 9 37 Average       Mood and Personality:      Raw Score Percentile   Geriatric Depression Scale: 12 --- Mild  Geriatric Anxiety Scale: 14 --- Mild    Somatic 3 --- Minimal    Cognitive 5 --- Mild    Affective 6 --- Mild       Additional Questionnaires:      Raw Score Percentile   PROMIS Sleep Disturbance Questionnaire: 24 --- None to Slight       Parkinson's Disease Questionnaire-39: Raw Score Percentile     Mobility 3 8 Minimally Affected    Activities of Daily Living 3 13 Mildly Affected    Emotional Well-Being 4 17 Mildly Affected    Stigma 1 6 Minimally Affected    Social Support 2 25 Mildly Affected    Cognitions 4 25 Mildly Affected    Communication 2 17 Mildly Affected    Bodily Discomfort 0 0 Minimally Affected   Informed Consent and Coding/Compliance:   Dr. Sarita Bottom was provided with a verbal description of the nature and purpose of the present neuropsychological evaluation. Also reviewed were the foreseeable risks and/or discomforts and benefits of the procedure, limits of confidentiality, and mandatory reporting requirements of this provider. The patient was given the opportunity to ask questions and receive answers about the evaluation. Oral consent to participate was provided by the patient.   This evaluation was conducted by Christia Reading, Ph.D., licensed clinical neuropsychologist. Dr. Sarita Bottom completed a comprehensive clinical interview with Dr. Melvyn Novas, billed as one unit [phone], and 210 minutes of cognitive testing and scoring, billed as one unit [phone] and six additional units [id number]. Psychometrist Cruzita Lederer,  B.S., assisted Dr. Melvyn Novas with test administration and scoring procedures. As a separate and discrete service, Dr. Melvyn Novas spent a total of 180 minutes in interpretation and report writing billed as one unit [phone] and two units 96133.

## 2020-03-19 ENCOUNTER — Other Ambulatory Visit: Payer: Self-pay | Admitting: Family Medicine

## 2020-03-25 ENCOUNTER — Ambulatory Visit (INDEPENDENT_AMBULATORY_CARE_PROVIDER_SITE_OTHER): Payer: Medicare PPO | Admitting: Psychology

## 2020-03-25 ENCOUNTER — Other Ambulatory Visit: Payer: Self-pay

## 2020-03-25 DIAGNOSIS — G3184 Mild cognitive impairment, so stated: Secondary | ICD-10-CM | POA: Diagnosis not present

## 2020-03-25 DIAGNOSIS — G20 Parkinson's disease: Secondary | ICD-10-CM | POA: Diagnosis not present

## 2020-03-25 NOTE — Patient Instructions (Signed)
Recommendations: A repeat neuropsychological evaluation in 18-24 months (or sooner if functional decline is noted) is recommended to assess the trajectory of future cognitive decline should it occur. This will also aid in future efforts towards improved diagnostic clarity.  I could not find any neuroimaging available for my review. If this has not been previously performed, imaging in the form of a brain MRI would be beneficial to establish an anatomical baseline to compare any future changes (should they occur) against, while also looking for any anatomical correlates of current cognitive weaknesses.  Pamela Patton would also benefit from practicing good sleep hygiene. These practices can include:   -Going to bed and waking up at the same time each day   -Using relaxation strategies before bed   -Doing a quiet activity until drowsy if unable to fall asleep within 20 minutes rather than watching television   -Using the bed only for sleeping (i.e., no television watching or other activities)   -Creating a good sleeping environment (e.g., dark enough, quiet enough, right temperature)   -Engaging in regular aerobic exercise  Pamela Patton is encouraged to attend to lifestyle factors for brain health (e.g., regular physical exercise, good nutrition habits, regular participation in cognitively-stimulating activities, and general stress management techniques), which are likely to have benefits for both emotional adjustment and cognition. In fact, in addition to promoting good general health, regular exercise incorporating aerobic activities (e.g., brisk walking, jogging, cycling, etc.) has been demonstrated to be a very effective treatment for depression and stress, with similar efficacy rates to both antidepressant medication and psychotherapy. Optimal control of vascular risk factors (including safe cardiovascular exercise and adherence to dietary recommendations) is encouraged. Continued participation in  activities which provide mental stimulation and social interaction is also recommended.  If interested, there are some activities which have therapeutic value and can be useful in keeping her cognitively stimulated. For suggestions, Pamela Patton is encouraged to go to the following website: https://www.[facility].org/get-to-know-barrow/centers-programs/neurorehabilitation-center/neuro-rehab-apps-and-games/ which has options, categorized by level of difficulty. It should be noted that these activities should not be viewed as a substitute for therapy.  For day-to-day problems recalling information, she may benefit from using strategies to aid with her learning and memory, such as asking questions for clarification, requesting that information to be repeated, or repeating an explanation in her own words to ensure comprehension and promote encoding.    Memory can be improved using internal strategies such as rehearsal, repetition, chunking, mnemonics, association, and imagery. External strategies such as written notes in a consistently used memory journal, visual and nonverbal auditory cues such as a calendar on the refrigerator or appointments with alarm, such as on a cell phone, can also help maximize recall.    To address problems with processing speed, she may wish to consider:   -Ensuring that she is alerted when essential material or instructions are being presented   -Adjusting the speed at which new information is presented   -Allowing for more time in comprehending, processing, and responding in conversation  To address problems with executive dysfunction, she may wish to consider:   -Avoiding external distractions when needing to concentrate   -Limiting exposure to fast paced environments with multiple sensory demands   -Writing down complicated information and using checklists   -Attempting and completing one task at a time (i.e., no multi-tasking)   -Verbalizing aloud each step of a task  to maintain focus    -Reducing the amount of information considered at one time

## 2020-03-25 NOTE — Progress Notes (Signed)
° °  Neuropsychology Feedback Session Pamela Patton. [facility]  Reason for Referral:   Pamela Patton a 74 y.o. left-handed African-American female referred by [name], D.O.,to characterize hercurrent cognitive functioning and assist with diagnostic clarity and treatment planning in the context of subjective cognitive decline and a history of Parkinson's disease.  Feedback:   Pamela Patton completed a comprehensive neuropsychological evaluation on 03/18/2020. Please refer to that encounter for the full report and recommendations. Briefly, results suggested weaknesses across processing speed, executive functioning (including working memory), visual encoding (i.e., learning), and retrieval aspects of both verbal and visual memory. Mild variability was noted across expressive language and visuospatial tasks, with a majority of performances scoring within normal limits. Regarding etiology, her pattern of performance is largely consistent with what would be expected in Parkinson's disease. As such, this likely represents the primary culprit for cognitive changes. Executive dysfunction is also common in Lewy body dementia. However, despite the presence of REM sleep behaviors, hallucinations do not appear fully formed (or fully verified that she is awake during their occurrence and they do not better represent vivid dreaming). She also denied fluctuations in alertness, demonstrated generally intact visuospatial functioning, and noted that levodopa medications seemed to be beneficial, making this diagnosis less likely at the present time. It is also worth noting that performances would be consistent with a primary vascular etiology and her medical history includes hypertension, hyperlipidemia, type II diabetes, and carotid artery disease. Neuroimaging was not available for my review to assess the presence of smell vessel ischemic changes. However, cognitive deficits due to  cardiovascular changes should remain on her differential.  Pamela Patton was unaccompanied during the current telephone call. She was at her residence while I was within my office. Content of the current session focused on the results of her neuropsychological evaluation. Pamela Patton was given the opportunity to ask questions and her questions were answered. She was encouraged to reach out should additional questions arise. A copy of her report was mailed at the conclusion of the visit.      32 minutes were spent conducting the current feedback session with Pamela Patton, billed as one unit [phone].

## 2020-03-26 DIAGNOSIS — E1165 Type 2 diabetes mellitus with hyperglycemia: Secondary | ICD-10-CM | POA: Diagnosis not present

## 2020-03-26 DIAGNOSIS — E7800 Pure hypercholesterolemia, unspecified: Secondary | ICD-10-CM | POA: Diagnosis not present

## 2020-03-26 DIAGNOSIS — E790 Hyperuricemia without signs of inflammatory arthritis and tophaceous disease: Secondary | ICD-10-CM | POA: Diagnosis not present

## 2020-03-26 DIAGNOSIS — Z794 Long term (current) use of insulin: Secondary | ICD-10-CM | POA: Diagnosis not present

## 2020-04-02 DIAGNOSIS — E790 Hyperuricemia without signs of inflammatory arthritis and tophaceous disease: Secondary | ICD-10-CM | POA: Diagnosis not present

## 2020-04-02 DIAGNOSIS — E890 Postprocedural hypothyroidism: Secondary | ICD-10-CM | POA: Diagnosis not present

## 2020-04-02 DIAGNOSIS — N183 Chronic kidney disease, stage 3 unspecified: Secondary | ICD-10-CM | POA: Diagnosis not present

## 2020-04-02 DIAGNOSIS — E7800 Pure hypercholesterolemia, unspecified: Secondary | ICD-10-CM | POA: Diagnosis not present

## 2020-04-02 DIAGNOSIS — E11319 Type 2 diabetes mellitus with unspecified diabetic retinopathy without macular edema: Secondary | ICD-10-CM | POA: Diagnosis not present

## 2020-04-02 DIAGNOSIS — E1165 Type 2 diabetes mellitus with hyperglycemia: Secondary | ICD-10-CM | POA: Diagnosis not present

## 2020-04-02 DIAGNOSIS — I129 Hypertensive chronic kidney disease with stage 1 through stage 4 chronic kidney disease, or unspecified chronic kidney disease: Secondary | ICD-10-CM | POA: Diagnosis not present

## 2020-04-02 DIAGNOSIS — E559 Vitamin D deficiency, unspecified: Secondary | ICD-10-CM | POA: Diagnosis not present

## 2020-04-02 DIAGNOSIS — E1122 Type 2 diabetes mellitus with diabetic chronic kidney disease: Secondary | ICD-10-CM | POA: Diagnosis not present

## 2020-05-18 NOTE — Progress Notes (Signed)
Assessment/Plan:   1.  Parkinsons Disease  -Continue carbidopa/levodopa 25/100, 2 tablets 3 times per day  2.  History of low blood pressure  -Improved after blood pressure medications were discontinued.  3.  RBD  -Understands safety.  Does not want any medication right now, as feels that this is not significantly active  4.  Depression  -Has been to counseling for this.  No SI/HI  -discussed meds today for worry/mood but pt declines.  Doesn't want more med  5.  MCI  -Had neurocognitive testing with Dr. Melvyn Novas in June, 2021 with evidence of MCI only. Subjective:   Pamela Patton was seen today in follow up for Parkinsons disease.  My previous records were reviewed prior to todays visit as well as outside records available to me. Pt had one fall - she got up in middle of the night and didn't turn on lights and tripped over a box that wasn't normally there.    Pt denies lightheadedness, near syncope.  No hallucinations.  In regards to mood, the patient states that "its getting better."  She does state that she is more easily scared/worried than in the past if something new comes up.  Had neurocognitive testing in June.  This demonstrated evidence of mild cognitive impairment.  Current prescribed movement disorder medications: Carbidopa/levodopa 25/100, 2 tablets 3 times per day   ALLERGIES:  No Known Allergies  CURRENT MEDICATIONS:  Outpatient Encounter Medications as of 05/19/2020  Medication Sig  . aspirin 81 MG tablet Take 81 mg by mouth daily with lunch.   Marland Kitchen atorvastatin (LIPITOR) 40 MG tablet Take 40 mg by mouth daily with lunch.   . Calcium Carbonate-Vitamin D (CALTRATE 600+D) 600-400 MG-UNIT per tablet Take 1 tablet by mouth 3 (three) times a week.   . carbidopa-levodopa (SINEMET IR) 25-100 MG tablet Take 2 tablets by mouth 3 (three) times daily.  . Cholecalciferol (VITAMIN D PO) Take 5,000 Units by mouth 3 (three) times a week.   . Cyanocobalamin (VITAMIN B12 PO) Take 1  tablet by mouth daily.  Marland Kitchen glucose 4 GM chewable tablet Chew 1 tablet by mouth as needed for low blood sugar.  Marland Kitchen HUMALOG KWIKPEN 100 UNIT/ML KiwkPen Inject 0-4 Units into the skin 3 (three) times daily with meals. Sliding scale per Dr Altheimer's office notes from 09/24/2019  . hydrochlorothiazide (HYDRODIURIL) 25 MG tablet Take 25 mg by mouth daily with lunch.   . insulin glargine (LANTUS) 100 UNIT/ML injection Inject 15 Units into the skin every evening.   Marland Kitchen levothyroxine (SYNTHROID, LEVOTHROID) 75 MCG tablet Take 75 mcg by mouth daily.  Marland Kitchen losartan (COZAAR) 25 MG tablet TAKE 2 TABLETS BY MOUTH EVERY DAY  . metFORMIN (GLUCOPHAGE-XR) 500 MG 24 hr tablet Take 1,000 mg by mouth daily with breakfast. Takes a few times a week  . NONFORMULARY OR COMPOUNDED ITEM [facility]:  Antifungal - Terbinafine 3%, Fluconazole 2%, Tea Tree Oil 5%, Urea 10%, Ibuprofen 2% in DMSO Suspension #30ml. Apply to the affected nail(s) once (at bedtime) or twice daily.  . Nutritional Supplements (FEEDING SUPPLEMENT, GLUCERNA 1.2 CAL,) LIQD Place 237 mLs into feeding tube daily with breakfast.  . Pioglitazone HCl (ACTOS PO) Take 45 mg by mouth daily with lunch.   Vladimir Faster Glycol-Propyl Glycol (SYSTANE) 0.4-0.3 % GEL ophthalmic gel Place 1 application into both eyes at bedtime as needed (FOR EYES).  . Blood Pressure Monitoring (BLOOD PRESSURE CUFF) MISC 1 Product by Does not apply route as needed. (Patient not  taking: Reported on 05/19/2020)   Facility-Administered Encounter Medications as of 05/19/2020  Medication  . 0.9 %  sodium chloride infusion    Objective:   PHYSICAL EXAMINATION:    VITALS:   Vitals:   05/19/20 1448  BP: 129/62  Pulse: 70  Resp: 20  SpO2: 100%  Weight: 137 lb (62.1 kg)  Height: 5\' 7"  (1.702 m)    GEN:  The patient appears stated age and is in NAD.   Neurological examination:  Orientation: The patient is alert and oriented x3. Cranial nerves: There is good facial symmetry  with mild facial hypomimia. The speech is fluent and clear. Soft palate rises symmetrically and there is no tongue deviation. Hearing is intact to conversational tone. Sensation: Sensation is intact to light touch throughout Motor: Strength is at least antigravity x4.  Movement examination: Tone: There is normal tone in the UE/LE Abnormal movements: mild LE dyskinesia Coordination:  There is mild decremation with RAM's, with any form of RAMS, including alternating supination and pronation of the forearm, hand opening and closing, finger taps, heel taps and toe taps, R>L Gait and Station: The patient has no difficulty arising out of a deep-seated chair without the use of the hands. The patient's stride length is good.    I have reviewed and interpreted the following labs independently  Patient had lab work on March 26, 2020.  Hemoglobin A1c was 8.1 (under the care of endocrinology).  Sodium was 142, potassium 4.4, chloride 107, CO2 29, BUN 25, creatinine 1.39  Total time spent on today's visit was 30 minutes, including both face-to-face time and nonface-to-face time.  Time included that spent on review of records (prior notes available to me/labs/imaging if pertinent), discussing treatment and goals, answering patient's questions and coordinating care.  Cc:  Caren Macadam, MD

## 2020-05-19 ENCOUNTER — Other Ambulatory Visit: Payer: Self-pay

## 2020-05-19 ENCOUNTER — Encounter: Payer: Self-pay | Admitting: Neurology

## 2020-05-19 ENCOUNTER — Ambulatory Visit: Payer: Medicare PPO | Admitting: Neurology

## 2020-05-19 VITALS — BP 129/62 | HR 70 | Resp 20 | Ht 67.0 in | Wt 137.0 lb

## 2020-05-19 DIAGNOSIS — G4752 REM sleep behavior disorder: Secondary | ICD-10-CM | POA: Diagnosis not present

## 2020-05-19 DIAGNOSIS — G3184 Mild cognitive impairment, so stated: Secondary | ICD-10-CM

## 2020-05-19 DIAGNOSIS — F330 Major depressive disorder, recurrent, mild: Secondary | ICD-10-CM | POA: Diagnosis not present

## 2020-05-19 DIAGNOSIS — G20 Parkinson's disease: Secondary | ICD-10-CM | POA: Diagnosis not present

## 2020-05-19 NOTE — Patient Instructions (Signed)
No changes in medication.  Let me know if you change your mind and want medication for mood/stress.  The physicians and staff at [facility] are committed to providing excellent care. You may receive a survey requesting feedback about your experience at our office. We strive to receive "very good" responses to the survey questions. If you feel that your experience would prevent you from giving the office a "very good " response, please contact our office to try to remedy the situation. We may be reached at [phone]. Thank you for taking the time out of your busy day to complete the survey.

## 2020-05-25 ENCOUNTER — Ambulatory Visit: Payer: Medicare PPO | Admitting: Internal Medicine

## 2020-05-25 ENCOUNTER — Encounter: Payer: Self-pay | Admitting: Internal Medicine

## 2020-05-25 ENCOUNTER — Other Ambulatory Visit: Payer: Self-pay

## 2020-05-25 VITALS — BP 116/62 | HR 71 | Temp 98.3°F | Ht 67.0 in | Wt 137.6 lb

## 2020-05-25 DIAGNOSIS — E1129 Type 2 diabetes mellitus with other diabetic kidney complication: Secondary | ICD-10-CM | POA: Diagnosis not present

## 2020-05-25 DIAGNOSIS — G20 Parkinson's disease: Secondary | ICD-10-CM | POA: Diagnosis not present

## 2020-05-25 DIAGNOSIS — N183 Chronic kidney disease, stage 3 unspecified: Secondary | ICD-10-CM

## 2020-05-25 DIAGNOSIS — Z794 Long term (current) use of insulin: Secondary | ICD-10-CM | POA: Diagnosis not present

## 2020-05-25 DIAGNOSIS — R21 Rash and other nonspecific skin eruption: Secondary | ICD-10-CM

## 2020-05-25 DIAGNOSIS — L853 Xerosis cutis: Secondary | ICD-10-CM | POA: Diagnosis not present

## 2020-05-25 NOTE — Patient Instructions (Signed)
Fortunately I do not see infection at this time.  Skin is dry  Use a non soap such as dove or caress etc   Otherwise cleansers such  as Cerave  aveeno    Then immediate hydration water based  Ointment better than creams    Non scented  Such as aquafor etc   Can use OTC hydrocortisone  At itchy patches 2 x per day for short term   7- 10 days    Make fu with PCP dr Raliegh Ip  In 2-3 weeks to see how doing .   Let us know if any pain  Pus infection   Looking areas ,

## 2020-05-25 NOTE — Progress Notes (Signed)
Chief Complaint  Patient presents with  . Abrasion    patient has a sore on her leg and has some redness and itching, started using a new soap    HPI: Pamela Patton 74 y.o. come in for  Problem with  Lesion on legs  pcp appt NA  In past weeks has had areas on shin and rahses that itch  She ran into box in dark and had an abrasion  That is getting better but had other areas  She changed soap  [place]  But too drying  Has been using niveoa litoin of years .   No other rashers and trying not to scratch  Rash on leg and then dry skin and then  Changed cleanser  Used coast.  She has dm and parkinsons  ROS: See pertinent positives and negatives per HPI. No fever pain  Past Medical History:  Diagnosis Date  . Anemia   . Arthritis   . 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 Carotid artery disease 03/21/2017  . Cataract    immature   . Chest pain summer 2012  . Chicken pox   . CKD (chronic kidney disease) 10/08/2012   Stage 3, GFR 30-59 ml/min  . Diverticulosis    difficulty with colonoscopy 2005  . DM (diabetes mellitus), type 2 with renal complications 47/42/5956  . Fracture    radial/ulnar 03/2016  . GERD (gastroesophageal reflux disease)   . Heart murmur    innocent murmur  . High cholesterol   . Hypertension associated with diabetes 02/22/2017  . Hyperuricemia 10/31/2016  . Hypothyroidism    s/p radioactive iodine tx for graves in 2001  . Intestinal metaplasia of gastric mucosa   . 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 Parkinson's disease 08/08/2016  . UTI (urinary tract infection)   . Vitamin D deficiency 08/24/2012  . Weight loss    eval with GI in 2016; s/p CT/MRI and PET scans    Family History  Problem Relation Age of Onset  . Cancer Mother        liver  . Liver cancer Mother   . Stroke Father 62  . Diabetes Father   . Hypertension Father   . Diabetes Maternal Grandmother   . Prostate cancer Maternal Grandfather   . Pancreatic cancer Brother     . Diabetes Sister   . Cancer Sister   . Diabetes Sister   . Colon polyps Neg Hx   . Colon cancer Neg Hx   . Esophageal cancer Neg Hx   . Rectal cancer Neg Hx   . Stomach cancer Neg Hx     Social History   Socioeconomic History  . Marital status: Single    Spouse name: Not on file  . Number of children: 1  . Years of education: 96  . Highest education level: Professional school degree (e.g., MD, DDS, DVM, JD)  Occupational History  . 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 Smoking status: Never Smoker  . Smokeless tobacco: Never Used  Vaping Use  . Vaping Use: Never used  Substance and Sexual Activity  . Alcohol use: No    Alcohol/week: 0.0 standard drinks  . Drug use: No  . Sexual activity: Not on file  Other Topics Concern  . 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 Difficulty of Paying Living Expenses: Not on file  Food Insecurity:   . Worried About Charity fundraiser in the Last Year: Not on file  . Ran Out of Food in the Last Year: Not on file  Transportation Needs:   . Lack of Transportation (Medical): Not on file  . Lack of Transportation (Non-Medical): Not on file  Physical Activity:   . Days of Exercise per Week: Not on file  . Minutes of Exercise per Session: Not on file  Stress:   . Feeling of Stress : Not on file  Social Connections:   . Frequency of Communication with Friends and Family: Not on file  . Frequency of Social Gatherings with Friends and Family: Not on file  . Attends Religious  Services: Not on file  . Active Member of Clubs or Organizations: Not on file  . Attends Archivist Meetings: Not on file  . Marital Status: Not on file    Outpatient Medications Prior to Visit  Medication Sig Dispense Refill  . aspirin 81 MG tablet Take 81 mg by mouth daily with lunch.     Marland Kitchen atorvastatin (LIPITOR) 40 MG tablet Take 40 mg by mouth daily with lunch.     . Calcium Carbonate-Vitamin D (CALTRATE 600+D) 600-400 MG-UNIT per tablet Take 1 tablet by mouth 3 (three) times a week.     . carbidopa-levodopa (SINEMET IR) 25-100 MG tablet Take 2 tablets by mouth 3 (three) times daily. 540 tablet 1  . Cholecalciferol (VITAMIN D PO) Take 5,000 Units by mouth 3 (three) times a week.     . Cyanocobalamin (VITAMIN B12 PO) Take 1 tablet by mouth daily.    Marland Kitchen glucose 4 GM chewable tablet Chew 1 tablet by mouth as needed for low blood sugar.    Marland Kitchen HUMALOG KWIKPEN 100 UNIT/ML KiwkPen Inject 0-4 Units into the skin 3 (three) times daily with meals. Sliding scale per Dr Altheimer's office notes from 09/24/2019    . hydrochlorothiazide (HYDRODIURIL) 25 MG tablet Take 25 mg by mouth daily with lunch.     . insulin glargine (LANTUS) 100 UNIT/ML injection Inject 15 Units into the skin every evening.     Marland Kitchen levothyroxine (SYNTHROID, LEVOTHROID) 75 MCG tablet Take 75 mcg by mouth daily.    Marland Kitchen losartan (COZAAR) 25 MG tablet TAKE 2 TABLETS BY MOUTH EVERY DAY 180 tablet 1  . metFORMIN (GLUCOPHAGE-XR) 500 MG 24 hr tablet Take 1,000 mg by mouth daily with breakfast. Takes a few times a week    . NONFORMULARY OR COMPOUNDED ITEM [facility]:  Antifungal - Terbinafine 3%, Fluconazole 2%, Tea Tree Oil 5%, Urea 10%, Ibuprofen 2% in DMSO Suspension #44ml. Apply to the affected nail(s) once (at bedtime) or twice daily. 30 each 5  . Nutritional Supplements (FEEDING SUPPLEMENT, GLUCERNA 1.2 CAL,) LIQD Place 237 mLs into feeding tube daily with breakfast.    . Pioglitazone HCl (ACTOS PO) Take 45 mg by mouth  daily with lunch.     Vladimir Faster Glycol-Propyl Glycol (SYSTANE) 0.4-0.3 % GEL ophthalmic gel Place  1 application into both eyes at bedtime as needed (FOR EYES).    . Blood Pressure Monitoring (BLOOD PRESSURE CUFF) MISC 1 Product by Does not apply route as needed. (Patient not taking: Reported on 05/19/2020) 1 each 0   Facility-Administered Medications Prior to Visit  Medication Dose Route Frequency Provider Last Rate Last Admin  . 0.9 %  sodium chloride infusion  500 mL Intravenous Continuous Armbruster, Carlota Raspberry, MD         EXAM:  BP 116/62   Pulse 71   Temp 98.3 F (36.8 C) (Oral)   Ht 5\' 7"  (1.702 m)   Wt 137 lb 9.6 oz (62.4 kg)   SpO2 98%   BMI 21.55 kg/m   Body mass index is 21.55 kg/m.  GENERAL: vitals reviewed and listed above, alert, oriented, appears well hydrated and in no acute distress HEENT: atraumatic, conjunctiva  clear, no obvious abnormalities on inspection of external nose and ears OP masked NECK: no obvious masses on inspection palpation  CV: HRRR, no clubbing cyanosis or  peripheral edema nl cap refill  MS: moves all extremities without noticeable focal  Abnormality  Shins bilaterally above the sock line have some hyperpigmented areas some red patches and a healed pin point abrasion right   No sig edema  Pulses are bilateral  and no ulcers or  Open wounds  PSYCH: pleasant and cooperative, no obvious depression or anxiety Lab Results  Component Value Date   WBC 4.6 09/11/2018   HGB 10.3 (L) 09/11/2018   HCT 31.3 (L) 09/11/2018   PLT 204.0 09/11/2018   GLUCOSE 123 (H) 09/11/2018   CHOL 155 05/22/2018   TRIG 46 05/22/2018   HDL 69 05/22/2018   LDLCALC 64 05/22/2018   ALT 10 04/27/2017   AST 14 04/27/2017   NA 145 09/11/2018   K 4.3 09/11/2018   CL 107 09/11/2018   CREATININE 1.35 (H) 09/11/2018   BUN 27 (H) 09/11/2018   CO2 32 09/11/2018   TSH 0.30 (A) 04/27/2017   HGBA1C 8.1 (H) 09/11/2018   MICROALBUR 1.1 06/17/2014   BP Readings from Last 3  Encounters:  05/25/20 116/62  05/19/20 129/62  12/10/19 103/69    ASSESSMENT AND PLAN:  Discussed the following assessment and plan:  Skin rash  Dry skin  Stage 3 chronic kidney disease, unspecified whether stage 3a or 3b CKD  Type 2 diabetes mellitus with other diabetic kidney complication, with long-term current use of insulin (HCC)  PD (Parkinson's disease) ([place]) Counseled about avoid drying cleansers  Local care   No obv bacterial infection and no evidence of vascular ulcer  Etc   Plan fu dr Raliegh Ip PCP in 2-3 weeks  To fu  And if any signs of  Bacterial infeciton or progression -Patient advised to return or notify health care team  if  new concerns arise.  Patient Instructions  Fortunately I do not see infection at this time.  Skin is dry  Use a non soap such as dove or caress etc   Otherwise cleansers such  as Cerave  aveeno    Then immediate hydration water based  Ointment better than creams    Non scented  Such as aquafor etc   Can use OTC hydrocortisone  At itchy patches 2 x per day for short term   7- 10 days    Make fu with PCP dr Raliegh Ip  In 2-3 weeks to see how doing .   Let us know if any pain  Pus  infection   Looking areas ,     Standley Brooking. Bianca Vester M.D.

## 2020-06-18 DIAGNOSIS — D49512 Neoplasm of unspecified behavior of left kidney: Secondary | ICD-10-CM | POA: Diagnosis not present

## 2020-06-18 DIAGNOSIS — D3000 Benign neoplasm of unspecified kidney: Secondary | ICD-10-CM | POA: Diagnosis not present

## 2020-06-26 ENCOUNTER — Other Ambulatory Visit: Payer: Self-pay | Admitting: Urology

## 2020-06-26 ENCOUNTER — Other Ambulatory Visit (HOSPITAL_COMMUNITY): Payer: Self-pay | Admitting: Urology

## 2020-06-26 DIAGNOSIS — D49512 Neoplasm of unspecified behavior of left kidney: Secondary | ICD-10-CM

## 2020-06-30 ENCOUNTER — Other Ambulatory Visit: Payer: Self-pay

## 2020-06-30 ENCOUNTER — Ambulatory Visit (INDEPENDENT_AMBULATORY_CARE_PROVIDER_SITE_OTHER): Payer: Medicare PPO

## 2020-06-30 DIAGNOSIS — Z0000 Encounter for general adult medical examination without abnormal findings: Secondary | ICD-10-CM

## 2020-06-30 NOTE — Progress Notes (Signed)
Virtual Visit via Telephone Note  I connected with  Pamela Patton on 06/30/20 at 11:00 AM EDT by telephone and verified that I am speaking with the correct person using two identifiers.  Medicare Annual Wellness visit completed telephonically due to Covid-19 pandemic.   Persons participating in this call: This Health Coach and this patient.   Location: Patient: Home Provider: Office   I discussed the limitations, risks, security and privacy concerns of performing an evaluation and management service by telephone and the availability of in person appointments. The patient expressed understanding and agreed to proceed.  Unable to perform video visit due to video visit attempted and failed and/or patient does not have video capability.   Some vital signs may be absent or patient reported.   Willette Brace, LPN    Subjective:   Pamela Patton is a 74 y.o. female who presents for Medicare Annual (Subsequent) preventive examination.  Review of Systems     Cardiac Risk Factors include: advanced age (>67men, >82 women);diabetes mellitus;hypertension     Objective:    There were no vitals filed for this visit. There is no height or weight on file to calculate BMI.  Advanced Directives 06/30/2020 05/19/2020 12/10/2019 07/02/2019 02/15/2019 02/15/2019 05/08/2018  Does Patient Have a Medical Advance Directive? Yes Yes Yes Yes No No Yes  Type of Advance Directive Living will Living will - Living will;Healthcare Power of Attorney - - -  Would patient like information on creating a medical advance directive? - - - - - (No Data) -    Current Medications (verified) Outpatient Encounter Medications as of 06/30/2020  Medication Sig   aspirin 81 MG tablet Take 81 mg by mouth daily with lunch.    atorvastatin (LIPITOR) 40 MG tablet Take 40 mg by mouth daily with lunch.    Calcium Carbonate-Vitamin D (CALTRATE 600+D) 600-400 MG-UNIT per tablet Take 1 tablet by mouth 3 (three) times a week.     carbidopa-levodopa (SINEMET IR) 25-100 MG tablet Take 2 tablets by mouth 3 (three) times daily.   Cholecalciferol (VITAMIN D PO) Take 5,000 Units by mouth 3 (three) times a week.    Cyanocobalamin (VITAMIN B12 PO) Take 1 tablet by mouth daily.   glucose 4 GM chewable tablet Chew 1 tablet by mouth as needed for low blood sugar.   glucose blood (ACCU-CHEK AVIVA PLUS) test strip Use to test blood sugar 4 times a day (Dx: E11.65)   HUMALOG KWIKPEN 100 UNIT/ML KiwkPen Inject 0-4 Units into the skin 3 (three) times daily with meals. Sliding scale per Dr Altheimer's office notes from 09/24/2019   hydrochlorothiazide (HYDRODIURIL) 25 MG tablet Take 25 mg by mouth daily with lunch.    insulin glargine (LANTUS) 100 UNIT/ML injection Inject 15 Units into the skin every evening.    Insulin Syringe-Needle U-100 (INSULIN SYRINGE .3CC/31GX5/16") 31G X 5/16" 0.3 ML MISC Use as directed for Lantus injection once a day   levothyroxine (SYNTHROID, LEVOTHROID) 75 MCG tablet Take 75 mcg by mouth daily.   losartan (COZAAR) 25 MG tablet TAKE 2 TABLETS BY MOUTH EVERY DAY   metFORMIN (GLUCOPHAGE-XR) 500 MG 24 hr tablet Take 1,000 mg by mouth daily with breakfast. Takes a few times a week   NONFORMULARY OR COMPOUNDED ITEM [facility]:  Antifungal - Terbinafine 3%, Fluconazole 2%, Tea Tree Oil 5%, Urea 10%, Ibuprofen 2% in DMSO Suspension #25ml. Apply to the affected nail(s) once (at bedtime) or twice daily.   Nutritional Supplements (FEEDING SUPPLEMENT, GLUCERNA 1.2  CAL,) LIQD Place 237 mLs into feeding tube daily with breakfast.   OneTouch Delica Lancets 97L MISC Use to test blood sugar 4 times a day (Dx: E11.65)   Pioglitazone HCl (ACTOS PO) Take 45 mg by mouth daily with lunch.    Polyethyl Glycol-Propyl Glycol (SYSTANE) 0.4-0.3 % GEL ophthalmic gel Place 1 application into both eyes at bedtime as needed (FOR EYES).   Blood Pressure Monitoring (BLOOD PRESSURE CUFF) MISC 1 Product by Does not apply  route as needed. (Patient not taking: Reported on 05/19/2020)   Facility-Administered Encounter Medications as of 06/30/2020  Medication   0.9 %  sodium chloride infusion    Allergies (verified) Patient has no known allergies.   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   Carotid artery disease 03/21/2017   Cataract    immature    Chest pain summer 2012   Chicken pox    CKD (chronic kidney disease) 10/08/2012   Stage 3, GFR 30-59 ml/min   Diverticulosis    difficulty with colonoscopy 2005   DM (diabetes mellitus), type 2 with renal complications 89/21/1941   Fracture    radial/ulnar 03/2016   GERD (gastroesophageal reflux disease)    Heart murmur    innocent murmur   High cholesterol    Hypertension associated with diabetes 02/22/2017   Hyperuricemia 10/31/2016   Hypothyroidism    s/p radioactive iodine tx for graves in 2001   Intestinal metaplasia of gastric mucosa    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   Parkinson's disease 08/08/2016   UTI (urinary tract infection)    Vitamin D deficiency 08/24/2012   Weight loss    eval with GI in 2016; s/p CT/MRI and PET scans   Past Surgical History:  Procedure Laterality Date   ABDOMINAL HYSTERECTOMY  1989   CATARACT EXTRACTION, BILATERAL     COLONOSCOPY     DG BARIUM SWALLOW ([place] [facility])     9 yrs ago per pt due to incomplete colonsocopy   Family History  Problem Relation Age of Onset   Cancer Mother        liver   Liver cancer Mother    Stroke Father 109   Diabetes Father    Hypertension Father    Diabetes Maternal Grandmother    Prostate cancer Maternal Grandfather    Pancreatic cancer Brother    Diabetes Sister    Cancer Sister    Diabetes Sister    Colon polyps Neg Hx    Colon cancer Neg Hx    Esophageal cancer Neg Hx    Rectal cancer Neg Hx    Stomach cancer Neg Hx    Social History    Socioeconomic History   Marital status: Single    Spouse name: Not on file   Number of children: 1   Years of education: 20   Highest education level: Professional school degree (e.g., MD, DDS, DVM, JD)  Occupational History   Occupation: retired    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[employer], histology, anatomy and physiology  Tobacco Use   Smoking status: Never Smoker   Smokeless tobacco: Never Used  Scientific laboratory technician Use: Never used  Substance and Sexual Activity   Alcohol use: No    Alcohol/week: 0.0 standard drinks   Drug use: No   Sexual activity: Not on file  Other Topics Concern   Not on file  Social History Narrative   Work:  recently retired Firefighter, 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      Home Situation:  Lives alone, no family around, good friends      Spiritual Beliefs: christian, very involved with her church - [employer]      Lifestyle: exercising 5 days per week, trying to eat healthy               Social Determinants of Health   Financial Resource Strain: Low Risk    Difficulty of Paying Living Expenses: Not hard at all  Food Insecurity: No Food Insecurity   Worried About Charity fundraiser in the Last Year: Never true   Arboriculturist in the Last Year: Never true  Transportation Needs: No Transportation Needs   Lack of Transportation (Medical): No   Lack of Transportation (Non-Medical): No  Physical Activity: Inactive   Days of Exercise per Week: 0 days   Minutes of Exercise per Session: 0 min  Stress: No Stress Concern Present   Feeling of Stress : Only a little  Social Connections: Socially Isolated   Frequency of Communication with Friends and Family: More than three times a week   Frequency of Social Gatherings with Friends and Family: Never   Attends Religious Services: Never   Printmaker: No   Attends Music therapist: Never   Marital  Status: Never married    Tobacco Counseling Counseling given: Not Answered   Clinical Intake:  Pre-visit preparation completed: Yes  Pain : No/denies pain     Nutritional Risks: None Diabetes: Yes CBG done?: Yes (147) CBG resulted in Enter/ Edit results?: No Did pt. bring in CBG monitor from home?: No  How often do you need to have someone help you when you read instructions, pamphlets, or other written materials from your doctor or pharmacy?: 1 - Never  Diabetic?Yes  Interpreter Needed?: No  Information entered by :: Charlott Rakes, LPN   Activities of Daily Living In your present state of health, do you have any difficulty performing the following activities: 06/30/2020  Hearing? N  Vision? N  Difficulty concentrating or making decisions? Y  Comment at times  Walking or climbing stairs? N  Dressing or bathing? N  Doing errands, shopping? N  Preparing Food and eating ? N  Comment when i feel like it  Using the Toilet? N  In the past six months, have you accidently leaked urine? N  Do you have problems with loss of bowel control? Y  Comment only when use metformin  Managing your Medications? N  Managing your Finances? N  Housekeeping or managing your Housekeeping? N  Some recent data might be hidden    Patient Care Team: Caren Macadam, MD as PCP - General (Family Medicine) Lorretta Harp, MD as PCP - Cardiology (Cardiology) Ardis Hughs, MD as Attending Physician (Urology) Armbruster, Carlota Raspberry, MD as Consulting Physician (Gastroenterology) Altheimer, Legrand Como, MD as Referring Physician (Endocrinology) [name], Eustace Quail, DO as Consulting Physician (Neurology)  Indicate any recent Medical Services you may have received from other than [facility] providers in the past year (date may be approximate).     Assessment:   This is a routine wellness examination for Lezlee.  Hearing/Vision screen  Hearing Screening   125Hz  250Hz  500Hz  1000Hz  2000Hz  3000Hz   4000Hz  6000Hz  8000Hz   Right ear:           Left ear:  Comments: Pt denies any difficulty at this time  Vision Screening Comments: Pt follows up with Dr Herbert Deaner for eye exams  Dietary issues and exercise activities discussed: Current Exercise Habits: The patient does not participate in regular exercise at present  Goals     patient centered     Get her home more organized Finding time is an issue  Uses her strengths she dev during fall to help her now      Patient [place] and buys thinks partially cooked. Casserole's are easy  Buy a few items so it is not overwhelming Air tight containers; cut vegetables so they are ready  Plan your trips       Patient Stated     Eating more vegan like foods       Depression Screen PHQ 2/9 Scores 06/30/2020 08/20/2019 05/08/2018 05/04/2017 04/27/2017 03/22/2016 04/02/2015  PHQ - 2 Score 0 1 0 0 0 0 1    Fall Risk Fall Risk  06/30/2020 05/19/2020 12/10/2019 07/02/2019 02/15/2019  Falls in the past year? 0 0 0 0 0  Number falls in past yr: 0 0 - 0 -  Injury with Fall? 0 0 - 0 0  Follow up Falls prevention discussed - - - -    Any stairs in or around the home? Yes  If so, are there any without handrails? No  Home free of loose throw rugs in walkways, pet beds, electrical cords, etc? Yes  Adequate lighting in your home to reduce risk of falls? Yes   ASSISTIVE DEVICES UTILIZED TO PREVENT FALLS:   Life alert? No  Use of a cane, walker or w/c? No  Grab bars in the bathroom? Yes  Shower chair or bench in shower? No  Elevated toilet seat or a handicapped toilet? Yes   TIMED UP AND GO:  Was the test performed? No .   Cognitive Function: MMSE - Mini Mental State Exam 05/08/2018  Not completed: (No Data)     6CIT Screen 06/30/2020 05/04/2017  What Year? 0 points 0 points  What month? 0 points 0 points  What time? - 0 points  Count back from 20 0 points 0 points  Months in reverse 0 points 0 points  Repeat phrase 2  points 0 points  Total Score - 0    Immunizations Immunization History  Administered Date(s) Administered   Influenza Whole 07/03/2012   Influenza, High Dose Seasonal PF 08/01/2017, 08/23/2018, 06/21/2019   Influenza-Unspecified 07/04/2015, 07/04/2016, 07/03/2017, 08/21/2018, 06/21/2019   PFIZER SARS-COV-2 Vaccination 10/23/2019, 11/11/2019   Pneumococcal Conjugate-13 06/17/2014   Pneumococcal Polysaccharide-23 10/03/2009, 02/01/2011   Tdap 03/03/2009, 03/12/2016   Zoster 01/31/2010    TDAP status: Up to date Flu Vaccine status: Declined, Education has been provided regarding the importance of this vaccine but patient still declined. Advised may receive this vaccine at local pharmacy or Health Dept. Aware to provide a copy of the vaccination record if obtained from local pharmacy or Health Dept. Verbalized acceptance and understanding. Pneumococcal vaccine status: Up to date Covid-19 vaccine status: Completed vaccines  Qualifies for Shingles Vaccine? Yes   Zostavax completed Yes   Shingrix Completed?: No.    Education has been provided regarding the importance of this vaccine. Patient has been advised to call insurance company to determine out of pocket expense if they have not yet received this vaccine. Advised may also receive vaccine at local pharmacy or Health Dept. Verbalized acceptance and understanding.  Screening Tests Health Maintenance  Topic Date Due   HEMOGLOBIN A1C  08/23/2019   INFLUENZA VACCINE  05/03/2020   OPHTHALMOLOGY EXAM  08/05/2020   FOOT EXAM  11/24/2020   COLONOSCOPY  07/28/2021   TETANUS/TDAP  03/12/2026   DEXA SCAN  Completed   COVID-19 Vaccine  Completed   Hepatitis C Screening  Completed   PNA vac Low Risk Adult  Completed    Health Maintenance  Health Maintenance Due  Topic Date Due   HEMOGLOBIN A1C  08/23/2019   INFLUENZA VACCINE  05/03/2020    Colorectal cancer screening: Completed 07/28/16. Repeat every 5  years Mammogram status: Completed 08/28/19. Repeat every year Bone Density status: Completed 07/31/17. Results reflect: Bone density results: OSTEOPOROSIS. Repeat every 2 years.    Additional Screening:  Hepatitis C Screening:  Completed 04/18/16  Vision Screening: Recommended annual ophthalmology exams for early detection of glaucoma and other disorders of the eye. Is the patient up to date with their annual eye exam?  Yes  Who is the provider or what is the name of the office in which the patient attends annual eye exams? Dr Herbert Deaner   Dental Screening: Recommended annual dental exams for proper oral hygiene  Community Resource Referral / Chronic Care Management: CRR required this visit?  No   CCM required this visit?  No      Plan:     I have personally reviewed and noted the following in the patients chart:    Medical and social history  Use of alcohol, tobacco or illicit drugs   Current medications and supplements  Functional ability and status  Nutritional status  Physical activity  Advanced directives  List of other physicians  Hospitalizations, surgeries, and ER visits in previous 12 months  Vitals  Screenings to include cognitive, depression, and falls  Referrals and appointments  In addition, I have reviewed and discussed with patient certain preventive protocols, quality metrics, and best practice recommendations. A written personalized care plan for preventive services as well as general preventive health recommendations were provided to patient.     Willette Brace, LPN   4/49/2010   Nurse Notes: None

## 2020-06-30 NOTE — Patient Instructions (Addendum)
Pamela Patton , Thank you for taking time to come for your Medicare Wellness Visit. I appreciate your ongoing commitment to your health goals. Please review the following plan we discussed and let me know if I can assist you in the future.   Screening recommendations/referrals: Colonoscopy: Done 07/28/16 Mammogram: Done 08/28/19 Bone Density: Done 07/31/17 Recommended yearly ophthalmology/optometry visit for glaucoma screening and checkup Recommended yearly dental visit for hygiene and checkup  Vaccinations: Influenza vaccine: Due and discussed  Pneumococcal vaccine: Up to date Tdap vaccine: Up to date Shingles vaccine: Shingrix discussed. Please contact your pharmacy for coverage information.    Covid-19:Completed 1/20 & 11/11/19  Advanced directives: Please bring a copy of your health care power of attorney and living will to the office at your convenience.  Conditions/risks identified: eat more of the vegan style meals  Next appointment: Follow up in one year for your annual wellness visit    Preventive Care 65 Years and Older, Female Preventive care refers to lifestyle choices and visits with your health care provider that can promote health and wellness. What does preventive care include?  A yearly physical exam. This is also called an annual well check.  Dental exams once or twice a year.  Routine eye exams. Ask your health care provider how often you should have your eyes checked.  Personal lifestyle choices, including:  Daily care of your teeth and gums.  Regular physical activity.  Eating a healthy diet.  Avoiding tobacco and drug use.  Limiting alcohol use.  Practicing safe sex.  Taking low-dose aspirin every day.  Taking vitamin and mineral supplements as recommended by your health care provider. What happens during an annual well check? The services and screenings done by your health care provider during your annual well check will depend on your age, overall  health, lifestyle risk factors, and family history of disease. Counseling  Your health care provider may ask you questions about your:  Alcohol use.  Tobacco use.  Drug use.  Emotional well-being.  Home and relationship well-being.  Sexual activity.  Eating habits.  History of falls.  Memory and ability to understand (cognition).  Work and work Statistician.  Reproductive health. Screening  You may have the following tests or measurements:  Height, weight, and BMI.  Blood pressure.  Lipid and cholesterol levels. These may be checked every 5 years, or more frequently if you are over 15 years old.  Skin check.  Lung cancer screening. You may have this screening every year starting at age 24 if you have a 30-pack-year history of smoking and currently smoke or have quit within the past 15 years.  Fecal occult blood test (FOBT) of the stool. You may have this test every year starting at age 23.  Flexible sigmoidoscopy or colonoscopy. You may have a sigmoidoscopy every 5 years or a colonoscopy every 10 years starting at age 73.  Hepatitis C blood test.  Hepatitis B blood test.  Sexually transmitted disease (STD) testing.  Diabetes screening. This is done by checking your blood sugar (glucose) after you have not eaten for a while (fasting). You may have this done every 1-3 years.  Bone density scan. This is done to screen for osteoporosis. You may have this done starting at age 50.  Mammogram. This may be done every 1-2 years. Talk to your health care provider about how often you should have regular mammograms. Talk with your health care provider about your test results, treatment options, and if necessary, the need  for more tests. Vaccines  Your health care provider may recommend certain vaccines, such as:  Influenza vaccine. This is recommended every year.  Tetanus, diphtheria, and acellular pertussis (Tdap, Td) vaccine. You may need a Td booster every 10  years.  Zoster vaccine. You may need this after age 31.  Pneumococcal 13-valent conjugate (PCV13) vaccine. One dose is recommended after age 84.  Pneumococcal polysaccharide (PPSV23) vaccine. One dose is recommended after age 53. Talk to your health care provider about which screenings and vaccines you need and how often you need them. This information is not intended to replace advice given to you by your health care provider. Make sure you discuss any questions you have with your health care provider. Document Released: 10/16/2015 Document Revised: 06/08/2016 Document Reviewed: 07/21/2015 Elsevier Interactive Patient Education  2017 [place] Prevention in the Home Falls can cause injuries. They can happen to people of all ages. There are many things you can do to make your home safe and to help prevent falls. What can I do on the outside of my home?  Regularly fix the edges of walkways and driveways and fix any cracks.  Remove anything that might make you trip as you walk through a door, such as a raised step or threshold.  Trim any bushes or trees on the path to your home.  Use bright outdoor lighting.  Clear any walking paths of anything that might make someone trip, such as rocks or tools.  Regularly check to see if handrails are loose or broken. Make sure that both sides of any steps have handrails.  Any raised decks and porches should have guardrails on the edges.  Have any leaves, snow, or ice cleared regularly.  Use sand or salt on walking paths during winter.  Clean up any spills in your garage right away. This includes oil or grease spills. What can I do in the bathroom?  Use night lights.  Install grab bars by the toilet and in the tub and shower. Do not use towel bars as grab bars.  Use non-skid mats or decals in the tub or shower.  If you need to sit down in the shower, use a plastic, non-slip stool.  Keep the floor dry. Clean up any water that  spills on the floor as soon as it happens.  Remove soap buildup in the tub or shower regularly.  Attach bath mats securely with double-sided non-slip rug tape.  Do not have throw rugs and other things on the floor that can make you trip. What can I do in the bedroom?  Use night lights.  Make sure that you have a light by your bed that is easy to reach.  Do not use any sheets or blankets that are too big for your bed. They should not hang down onto the floor.  Have a firm chair that has side arms. You can use this for support while you get dressed.  Do not have throw rugs and other things on the floor that can make you trip. What can I do in the kitchen?  Clean up any spills right away.  Avoid walking on wet floors.  Keep items that you use a lot in easy-to-reach places.  If you need to reach something above you, use a strong step stool that has a grab bar.  Keep electrical cords out of the way.  Do not use floor polish or wax that makes floors slippery. If you must use wax, use  non-skid floor wax.  Do not have throw rugs and other things on the floor that can make you trip. What can I do with my stairs?  Do not leave any items on the stairs.  Make sure that there are handrails on both sides of the stairs and use them. Fix handrails that are broken or loose. Make sure that handrails are as long as the stairways.  Check any carpeting to make sure that it is firmly attached to the stairs. Fix any carpet that is loose or worn.  Avoid having throw rugs at the top or bottom of the stairs. If you do have throw rugs, attach them to the floor with carpet tape.  Make sure that you have a light switch at the top of the stairs and the bottom of the stairs. If you do not have them, ask someone to add them for you. What else can I do to help prevent falls?  Wear shoes that:  Do not have high heels.  Have rubber bottoms.  Are comfortable and fit you well.  Are closed at the  toe. Do not wear sandals.  If you use a stepladder:  Make sure that it is fully opened. Do not climb a closed stepladder.  Make sure that both sides of the stepladder are locked into place.  Ask someone to hold it for you, if possible.  Clearly mark and make sure that you can see:  Any grab bars or handrails.  First and last steps.  Where the edge of each step is.  Use tools that help you move around (mobility aids) if they are needed. These include:  Canes.  Walkers.  Scooters.  Crutches.  Turn on the lights when you go into a dark area. Replace any light bulbs as soon as they burn out.  Set up your furniture so you have a clear path. Avoid moving your furniture around.  If any of your floors are uneven, fix them.  If there are any pets around you, be aware of where they are.  Review your medicines with your doctor. Some medicines can make you feel dizzy. This can increase your chance of falling. Ask your doctor what other things that you can do to help prevent falls. This information is not intended to replace advice given to you by your health care provider. Make sure you discuss any questions you have with your health care provider. Document Released: 07/16/2009 Document Revised: 02/25/2016 Document Reviewed: 10/24/2014 Elsevier Interactive Patient Education  2017 [employer].

## 2020-07-01 DIAGNOSIS — E7800 Pure hypercholesterolemia, unspecified: Secondary | ICD-10-CM | POA: Diagnosis not present

## 2020-07-01 DIAGNOSIS — E890 Postprocedural hypothyroidism: Secondary | ICD-10-CM | POA: Diagnosis not present

## 2020-07-01 DIAGNOSIS — I10 Essential (primary) hypertension: Secondary | ICD-10-CM | POA: Diagnosis not present

## 2020-07-01 DIAGNOSIS — E1165 Type 2 diabetes mellitus with hyperglycemia: Secondary | ICD-10-CM | POA: Diagnosis not present

## 2020-07-01 DIAGNOSIS — Z794 Long term (current) use of insulin: Secondary | ICD-10-CM | POA: Diagnosis not present

## 2020-07-03 DIAGNOSIS — N1831 Chronic kidney disease, stage 3a: Secondary | ICD-10-CM | POA: Diagnosis not present

## 2020-07-03 DIAGNOSIS — E1165 Type 2 diabetes mellitus with hyperglycemia: Secondary | ICD-10-CM | POA: Diagnosis not present

## 2020-07-03 DIAGNOSIS — E113293 Type 2 diabetes mellitus with mild nonproliferative diabetic retinopathy without macular edema, bilateral: Secondary | ICD-10-CM | POA: Diagnosis not present

## 2020-07-03 DIAGNOSIS — Z23 Encounter for immunization: Secondary | ICD-10-CM | POA: Diagnosis not present

## 2020-07-03 DIAGNOSIS — Z794 Long term (current) use of insulin: Secondary | ICD-10-CM | POA: Diagnosis not present

## 2020-07-03 DIAGNOSIS — E790 Hyperuricemia without signs of inflammatory arthritis and tophaceous disease: Secondary | ICD-10-CM | POA: Diagnosis not present

## 2020-07-03 DIAGNOSIS — I129 Hypertensive chronic kidney disease with stage 1 through stage 4 chronic kidney disease, or unspecified chronic kidney disease: Secondary | ICD-10-CM | POA: Diagnosis not present

## 2020-07-03 DIAGNOSIS — Z7984 Long term (current) use of oral hypoglycemic drugs: Secondary | ICD-10-CM | POA: Diagnosis not present

## 2020-07-03 DIAGNOSIS — E7800 Pure hypercholesterolemia, unspecified: Secondary | ICD-10-CM | POA: Diagnosis not present

## 2020-07-06 ENCOUNTER — Ambulatory Visit: Payer: Medicare PPO | Admitting: Family Medicine

## 2020-07-08 ENCOUNTER — Ambulatory Visit (HOSPITAL_COMMUNITY): Admission: RE | Admit: 2020-07-08 | Payer: Medicare PPO | Source: Ambulatory Visit

## 2020-07-08 ENCOUNTER — Encounter (HOSPITAL_COMMUNITY): Payer: Self-pay

## 2020-07-20 ENCOUNTER — Other Ambulatory Visit: Payer: Self-pay

## 2020-07-20 ENCOUNTER — Ambulatory Visit (HOSPITAL_COMMUNITY)
Admission: RE | Admit: 2020-07-20 | Discharge: 2020-07-20 | Disposition: A | Discharge status: Home / Self Care | Payer: Medicare PPO | Source: Ambulatory Visit | Attending: Urology | Admitting: Urology

## 2020-07-20 DIAGNOSIS — K862 Cyst of pancreas: Secondary | ICD-10-CM | POA: Diagnosis not present

## 2020-07-20 DIAGNOSIS — K8689 Other specified diseases of pancreas: Secondary | ICD-10-CM | POA: Diagnosis not present

## 2020-07-20 DIAGNOSIS — D49512 Neoplasm of unspecified behavior of left kidney: Secondary | ICD-10-CM | POA: Insufficient documentation

## 2020-07-20 DIAGNOSIS — N281 Cyst of kidney, acquired: Secondary | ICD-10-CM | POA: Diagnosis not present

## 2020-07-20 DIAGNOSIS — N2889 Other specified disorders of kidney and ureter: Secondary | ICD-10-CM | POA: Diagnosis not present

## 2020-07-20 MED ORDER — GADOBUTROL 1 MMOL/ML IV SOLN
6.2000 mL | Freq: Once | INTRAVENOUS | Status: AC | PRN
  Administered 2020-07-20: 6.2 mL via INTRAVENOUS

## 2020-08-03 DIAGNOSIS — D49512 Neoplasm of unspecified behavior of left kidney: Secondary | ICD-10-CM | POA: Diagnosis not present

## 2020-08-05 ENCOUNTER — Other Ambulatory Visit: Payer: Self-pay | Admitting: Neurology

## 2020-08-05 NOTE — Telephone Encounter (Signed)
Rx(s) sent to pharmacy electronically.  

## 2020-08-11 ENCOUNTER — Encounter: Payer: Self-pay | Admitting: Cardiovascular Disease

## 2020-08-11 ENCOUNTER — Other Ambulatory Visit: Payer: Self-pay

## 2020-08-11 ENCOUNTER — Ambulatory Visit: Payer: Medicare PPO | Admitting: Cardiovascular Disease

## 2020-08-11 DIAGNOSIS — I152 Hypertension secondary to endocrine disorders: Secondary | ICD-10-CM | POA: Diagnosis not present

## 2020-08-11 DIAGNOSIS — E1169 Type 2 diabetes mellitus with other specified complication: Secondary | ICD-10-CM | POA: Diagnosis not present

## 2020-08-11 DIAGNOSIS — E785 Hyperlipidemia, unspecified: Secondary | ICD-10-CM

## 2020-08-11 DIAGNOSIS — E1159 Type 2 diabetes mellitus with other circulatory complications: Secondary | ICD-10-CM | POA: Diagnosis not present

## 2020-08-11 LAB — HEPATIC FUNCTION PANEL
ALT: 5 IU/L (ref 0–32)
AST: 14 IU/L (ref 0–40)
Albumin: 4.5 g/dL (ref 3.7–4.7)
Alkaline Phosphatase: 73 IU/L (ref 44–121)
Bilirubin Total: 0.5 mg/dL (ref 0.0–1.2)
Bilirubin, Direct: 0.16 mg/dL (ref 0.00–0.40)
Total Protein: 6.9 g/dL (ref 6.0–8.5)

## 2020-08-11 LAB — LIPID PANEL
Chol/HDL Ratio: 2.1 ratio (ref 0.0–4.4)
Cholesterol, Total: 152 mg/dL (ref 100–199)
HDL: 72 mg/dL (ref >39)
LDL Chol Calc (NIH): 70 mg/dL (ref 0–99)
Triglycerides: 47 mg/dL (ref 0–149)
VLDL Cholesterol Cal: 10 mg/dL (ref 5–40)

## 2020-08-11 NOTE — Patient Instructions (Signed)
Medication Instructions:  Your Physician recommend you continue on your current medication as directed.    *If you need a refill on your cardiac medications before your next appointment, please call your pharmacy*   Lab Work: Your physician recommends that you return for lab work today ( Lipid, LFT).  If you have labs (blood work) drawn today and your tests are completely normal, you will receive your results only by: Marland Kitchen MyChart Message (if you have MyChart) OR . A paper copy in the mail If you have any lab test that is abnormal or we need to change your treatment, we will call you to review the results.   Testing/Procedures: None   Follow-Up: At [facility], you and your health needs are our priority.  As part of our continuing mission to provide you with exceptional heart care, we have created designated Provider Care Teams.  These Care Teams include your primary Cardiologist (physician) and Advanced Practice Providers (APPs -  Physician Assistants and Nurse Practitioners) who all work together to provide you with the care you need, when you need it.  We recommend signing up for the patient portal called "MyChart".  Sign up information is provided on this After Visit Summary.  MyChart is used to connect with patients for Virtual Visits (Telemedicine).  Patients are able to view lab/test results, encounter notes, upcoming appointments, etc.  Non-urgent messages can be sent to your provider as well.   To learn more about what you can do with MyChart, go to [email or web address].    Your next appointment:   1 year(s)  The format for your next appointment:   In Person  Provider:   Quay Burow, MD

## 2020-08-11 NOTE — Assessment & Plan Note (Signed)
History of hyperlipidemia on statin therapy.  We have not gotten at lipid profile on her in several years and we will check on this morning.

## 2020-08-11 NOTE — Assessment & Plan Note (Signed)
History of essential hypertension a blood pressure measured today 112/52.  She is on HydroDIURIL, and losartan.

## 2020-08-11 NOTE — Progress Notes (Signed)
08/11/2020 Pamela Patton   [birth year]  [id number]  Primary Physician Caren Macadam, MD Primary Cardiologist: Lorretta Harp MD Lupe Carney, [place]  HPI:  Pamela Patton is a 74 y.o.  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 I last saw her in the office10/03/2019. Her risk factor profile is remarkable for diabetes, hypertension and hyperlipidemia. She had a Myoview performed 2 years ago which showed breast attenuation artifact.She has been a symptomatic since I saw her urinary health ago. She has developed Parkinson's disease in the interim. Since I saw her a year ago her Parkinson's is remained stable. She was in a bicycle exercise class but because of COVID-19 exercises, walks outside Since I saw her a year ago she is remained stable.    Since I saw her a year ago she continues to do well.  She exercise by herself outside without complaints of chest pain or shortness of breath.  Her Parkinson's disease is stable.  Current Meds  Medication Sig  . aspirin 81 MG tablet Take 81 mg by mouth daily with lunch.   Marland Kitchen atorvastatin (LIPITOR) 40 MG tablet Take 40 mg by mouth daily with lunch.   . Blood Pressure Monitoring (BLOOD PRESSURE CUFF) MISC 1 Product by Does not apply route as needed.  . Calcium Carbonate-Vitamin D (CALTRATE 600+D) 600-400 MG-UNIT per tablet Take 1 tablet by mouth 3 (three) times a week.   . carbidopa-levodopa (SINEMET IR) 25-100 MG tablet TAKE 2 TABLETS BY MOUTH 3 TIMES DAILY.  Marland Kitchen Cholecalciferol (VITAMIN D PO) Take 5,000 Units by mouth 3 (three) times a week.   . Cyanocobalamin (VITAMIN B12 PO) Take 1 tablet by mouth daily.  Marland Kitchen glucose 4 GM chewable tablet Chew 1 tablet by mouth as needed for low blood sugar.  Marland Kitchen glucose blood (ACCU-CHEK AVIVA PLUS) test strip Use to test blood sugar 4 times a day (Dx: E11.65)  . HUMALOG KWIKPEN 100 UNIT/ML KiwkPen Inject 0-4  Units into the skin 3 (three) times daily with meals. Sliding scale per Dr Altheimer's office notes from 09/24/2019  . hydrochlorothiazide (HYDRODIURIL) 25 MG tablet Take 25 mg by mouth daily with lunch.   . insulin glargine (LANTUS) 100 UNIT/ML injection Inject 15 Units into the skin every evening.   . Insulin Syringe-Needle U-100 (INSULIN SYRINGE .3CC/31GX5/16") 31G X 5/16" 0.3 ML MISC Use as directed for Lantus injection once a day  . levothyroxine (SYNTHROID, LEVOTHROID) 75 MCG tablet Take 75 mcg by mouth daily.  Marland Kitchen losartan (COZAAR) 25 MG tablet TAKE 2 TABLETS BY MOUTH EVERY DAY  . metFORMIN (GLUCOPHAGE-XR) 500 MG 24 hr tablet Take 1,000 mg by mouth daily with breakfast. Takes a few times a week  . NONFORMULARY OR COMPOUNDED ITEM [facility]:  Antifungal - Terbinafine 3%, Fluconazole 2%, Tea Tree Oil 5%, Urea 10%, Ibuprofen 2% in DMSO Suspension #50ml. Apply to the affected nail(s) once (at bedtime) or twice daily.  . Nutritional Supplements (FEEDING SUPPLEMENT, GLUCERNA 1.2 CAL,) LIQD Place 237 mLs into feeding tube daily with breakfast.  . OneTouch Delica Lancets 47M MISC Use to test blood sugar 4 times a day (Dx: E11.65)  . Pioglitazone HCl (ACTOS PO) Take 45 mg by mouth daily with lunch.   Vladimir Faster Glycol-Propyl Glycol (SYSTANE) 0.4-0.3 % GEL ophthalmic gel Place 1 application into both eyes at bedtime as needed (FOR EYES).   Current Facility-Administered Medications for the  08/11/20 encounter (Office Visit) with Lorretta Harp, MD  Medication  . 0.9 %  sodium chloride infusion     No Known Allergies  Social History   Socioeconomic History  . Marital status: Single    Spouse name: Not on file  . Number of children: 1  . Years of education: 37  . Highest education level: Professional school degree (e.g., MD, DDS, DVM, JD)  Occupational History  . 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 Smoking status: Never  Smoker  . Smokeless tobacco: Never Used  Vaping Use  . Vaping Use: Never used  Substance and Sexual Activity  . Alcohol use: No    Alcohol/week: 0.0 standard drinks  . Drug use: No  . Sexual activity: Not on file  Other Topics Concern  . 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 Difficulty of Paying Living Expenses: Not hard at all  Food Insecurity: No Food Insecurity  . Worried About Charity fundraiser in the Last Year: Never true  . Ran Out of Food in the Last Year: Never true  Transportation Needs: No Transportation Needs  . Lack of Transportation (Medical): No  . Lack of Transportation (Non-Medical): No  Physical Activity: Inactive  . Days of Exercise per Week: 0 days  . Minutes of Exercise per Session: 0 min  Stress: No Stress Concern Present  . Feeling of Stress : Only a little  Social Connections: Socially Isolated  . Frequency of Communication with Friends and Family: More than three times a week  . Frequency of Social Gatherings with Friends and Family: Never  . Attends Religious Services: Never  . Active Member of Clubs or Organizations: No  . Attends Archivist Meetings: Never  . Marital Status: Never married  Intimate Partner Violence: Not At Risk  . Fear of Current or Ex-Partner: No  . Emotionally Abused: No  . Physically Abused: No  . Sexually Abused: No     Review of Systems: General: negative for chills, fever, night sweats or weight changes.  Cardiovascular: negative for chest pain, dyspnea on exertion, edema, orthopnea,  palpitations, paroxysmal nocturnal dyspnea or shortness of breath Dermatological: negative for rash Respiratory: negative for cough or wheezing Urologic: negative for hematuria Abdominal: negative for nausea, vomiting, diarrhea, bright red blood per rectum, melena, or hematemesis Neurologic: negative for visual changes, syncope, or dizziness All other systems reviewed and are otherwise negative except as noted above.    Blood pressure (!) 112/52, pulse (!) 57, height 5\' 7"  (1.702 m), weight 135 lb (61.2 kg).  General appearance: alert and no distress Neck: no adenopathy, no carotid bruit, no JVD, supple, symmetrical, trachea midline and thyroid not enlarged, symmetric, no tenderness/mass/nodules Lungs: clear to auscultation bilaterally Heart: regular rate and rhythm, S1, S2 normal, no murmur, click, rub or gallop Extremities: extremities normal, atraumatic, no cyanosis or edema Pulses: 2+ and symmetric Skin:  Skin color, texture, turgor normal. No rashes or lesions Neurologic: Alert and oriented X 3, normal strength and tone. Normal symmetric reflexes. Normal coordination and gait  EKG sinus bradycardia 57 without ST or T wave changes.  Personally reviewed this EKG.  ASSESSMENT AND PLAN:   Hypertension associated with diabetes ([place]) History of essential hypertension a blood pressure measured today 112/52.  She is on HydroDIURIL, and losartan.  Hyperlipidemia associated with type 2 diabetes mellitus ([place]) History of hyperlipidemia on statin therapy.  We have not gotten at lipid profile on her in several years and we will check on this morning.      Lorretta Harp MD FACP,FACC,FAHA, [facility] 08/11/2020 10:52 AM

## 2020-08-14 ENCOUNTER — Encounter (HOSPITAL_COMMUNITY): Payer: Self-pay | Admitting: Emergency Medicine

## 2020-08-14 ENCOUNTER — Other Ambulatory Visit: Payer: Self-pay

## 2020-08-14 ENCOUNTER — Emergency Department (HOSPITAL_COMMUNITY)
Admission: EM | Admit: 2020-08-14 | Discharge: 2020-08-15 | Disposition: A | Discharge status: Home / Self Care | Payer: Medicare PPO | Attending: Emergency Medicine | Admitting: Emergency Medicine

## 2020-08-14 ENCOUNTER — Emergency Department (HOSPITAL_COMMUNITY): Payer: Medicare PPO

## 2020-08-14 DIAGNOSIS — I129 Hypertensive chronic kidney disease with stage 1 through stage 4 chronic kidney disease, or unspecified chronic kidney disease: Secondary | ICD-10-CM | POA: Diagnosis not present

## 2020-08-14 DIAGNOSIS — W19XXXA Unspecified fall, initial encounter: Secondary | ICD-10-CM

## 2020-08-14 DIAGNOSIS — I10 Essential (primary) hypertension: Secondary | ICD-10-CM | POA: Diagnosis not present

## 2020-08-14 DIAGNOSIS — Y9301 Activity, walking, marching and hiking: Secondary | ICD-10-CM | POA: Diagnosis not present

## 2020-08-14 DIAGNOSIS — M25512 Pain in left shoulder: Secondary | ICD-10-CM | POA: Diagnosis not present

## 2020-08-14 DIAGNOSIS — G20 Parkinson's disease: Secondary | ICD-10-CM | POA: Insufficient documentation

## 2020-08-14 DIAGNOSIS — Z7982 Long term (current) use of aspirin: Secondary | ICD-10-CM | POA: Insufficient documentation

## 2020-08-14 DIAGNOSIS — Z794 Long term (current) use of insulin: Secondary | ICD-10-CM | POA: Insufficient documentation

## 2020-08-14 DIAGNOSIS — W108XXA Fall (on) (from) other stairs and steps, initial encounter: Secondary | ICD-10-CM | POA: Diagnosis not present

## 2020-08-14 DIAGNOSIS — Z85528 Personal history of other malignant neoplasm of kidney: Secondary | ICD-10-CM | POA: Diagnosis not present

## 2020-08-14 DIAGNOSIS — Z79899 Other long term (current) drug therapy: Secondary | ICD-10-CM | POA: Diagnosis not present

## 2020-08-14 DIAGNOSIS — E039 Hypothyroidism, unspecified: Secondary | ICD-10-CM | POA: Diagnosis not present

## 2020-08-14 DIAGNOSIS — Z7984 Long term (current) use of oral hypoglycemic drugs: Secondary | ICD-10-CM | POA: Insufficient documentation

## 2020-08-14 DIAGNOSIS — E1129 Type 2 diabetes mellitus with other diabetic kidney complication: Secondary | ICD-10-CM | POA: Insufficient documentation

## 2020-08-14 DIAGNOSIS — I2510 Atherosclerotic heart disease of native coronary artery without angina pectoris: Secondary | ICD-10-CM | POA: Diagnosis not present

## 2020-08-14 DIAGNOSIS — N183 Chronic kidney disease, stage 3 unspecified: Secondary | ICD-10-CM | POA: Insufficient documentation

## 2020-08-14 LAB — CBC WITH DIFFERENTIAL/PLATELET
Abs Immature Granulocytes: 0.02 10*3/uL (ref 0.00–0.07)
Basophils Absolute: 0.1 10*3/uL (ref 0.0–0.1)
Basophils Relative: 1 %
Eosinophils Absolute: 0 10*3/uL (ref 0.0–0.5)
Eosinophils Relative: 0 %
HCT: 32.2 % — ABNORMAL LOW (ref 36.0–46.0)
Hemoglobin: 9.9 g/dL — ABNORMAL LOW (ref 12.0–15.0)
Immature Granulocytes: 0 %
Lymphocytes Relative: 16 %
Lymphs Abs: 1.6 10*3/uL (ref 0.7–4.0)
MCH: 29.5 pg (ref 26.0–34.0)
MCHC: 30.7 g/dL (ref 30.0–36.0)
MCV: 95.8 fL (ref 80.0–100.0)
Monocytes Absolute: 0.7 10*3/uL (ref 0.1–1.0)
Monocytes Relative: 7 %
Neutro Abs: 7.1 10*3/uL (ref 1.7–7.7)
Neutrophils Relative %: 76 %
Platelets: 272 10*3/uL (ref 150–400)
RBC: 3.36 MIL/uL — ABNORMAL LOW (ref 3.87–5.11)
RDW: 14.6 % (ref 11.5–15.5)
WBC: 9.5 10*3/uL (ref 4.0–10.5)
nRBC: 0 % (ref 0.0–0.2)

## 2020-08-14 LAB — BASIC METABOLIC PANEL
Anion gap: 10 (ref 5–15)
BUN: 31 mg/dL — ABNORMAL HIGH (ref 8–23)
CO2: 23 mmol/L (ref 22–32)
Calcium: 9.4 mg/dL (ref 8.9–10.3)
Chloride: 109 mmol/L (ref 98–111)
Creatinine, Ser: 1.53 mg/dL — ABNORMAL HIGH (ref 0.44–1.00)
GFR, Estimated: 35 mL/min — ABNORMAL LOW (ref >60)
Glucose, Bld: 299 mg/dL — ABNORMAL HIGH (ref 70–99)
Potassium: 4.3 mmol/L (ref 3.5–5.1)
Sodium: 142 mmol/L (ref 135–145)

## 2020-08-14 MED ORDER — ACETAMINOPHEN 500 MG PO TABS
1000.0000 mg | ORAL_TABLET | Freq: Once | ORAL | Status: AC
  Administered 2020-08-15: 1000 mg via ORAL
  Filled 2020-08-14: qty 2

## 2020-08-14 MED ORDER — LIDOCAINE 5 % EX PTCH
1.0000 | MEDICATED_PATCH | CUTANEOUS | Status: DC
  Administered 2020-08-15: 1 via TRANSDERMAL
  Filled 2020-08-14: qty 1

## 2020-08-14 MED ORDER — IBUPROFEN 800 MG PO TABS
800.0000 mg | ORAL_TABLET | Freq: Once | ORAL | Status: AC
  Administered 2020-08-15: 800 mg via ORAL
  Filled 2020-08-14: qty 1

## 2020-08-14 NOTE — ED Triage Notes (Addendum)
Patient missed her step and fell while walking this afternoon , denies LOC / alert and oriented , respirations unlabored , reports mild left shoulder pain . She is not taking anticoagulant .

## 2020-08-15 ENCOUNTER — Encounter (HOSPITAL_COMMUNITY): Payer: Self-pay | Admitting: Emergency Medicine

## 2020-08-15 MED ORDER — LIDOCAINE 5 % EX PTCH: 1.0000 | MEDICATED_PATCH | CUTANEOUS | 0 refills | Status: DC

## 2020-08-15 MED ORDER — DICLOFENAC SODIUM 1 % EX GEL
4.0000 g | Freq: Four times a day (QID) | CUTANEOUS | 0 refills | Status: DC
Start: 2020-08-15 — End: 2021-05-04

## 2020-08-15 NOTE — ED Provider Notes (Signed)
[facility] EMERGENCY DEPARTMENT Provider Note   CSN: [id number] Arrival date & time: 08/14/20  2122     History Chief Complaint  Patient presents with  . Fall    Pamela Patton is a 74 y.o. female.  The history is provided by the patient.  Fall This is a new problem. The current episode started 12 to 24 hours ago. The problem occurs rarely. The problem has been resolved. Pertinent negatives include no chest pain, no abdominal pain, no headaches and no shortness of breath. Nothing aggravates the symptoms. Nothing relieves the symptoms. She has tried nothing for the symptoms. The treatment provided no relief.  Patient was on uneven ground, fell and hit left shoulder.  Did not hit head, no LOC, no emesis.  No neck or back pain.      Past Medical History:  Diagnosis Date  . Anemia   . Arthritis   . 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 Carotid artery disease 03/21/2017  . Cataract    immature   . Chest pain summer 2012  . Chicken pox   . CKD (chronic kidney disease) 10/08/2012   Stage 3, GFR 30-59 ml/min  . Diverticulosis    difficulty with colonoscopy 2005  . DM (diabetes mellitus), type 2 with renal complications 35/57/3220  . Fracture    radial/ulnar 03/2016  . GERD (gastroesophageal reflux disease)   . Heart murmur    innocent murmur  . High cholesterol   . Hypertension associated with diabetes 02/22/2017  . Hyperuricemia 10/31/2016  . Hypothyroidism    s/p radioactive iodine tx for graves in 2001  . Intestinal metaplasia of gastric mucosa   . 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 Parkinson's disease 08/08/2016  . UTI (urinary tract infection)   . Vitamin D deficiency 08/24/2012  . Weight loss    eval with GI in 2016; s/p CT/MRI and PET scans    Patient Active Problem List   Diagnosis Date Noted  . 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 Carotid artery disease ([place]) 03/21/2017  .  Hypertension associated with diabetes ([place]) 02/22/2017  . Hyperlipidemia associated with type 2 diabetes mellitus ([place]) 02/22/2017  . Hyperuricemia 10/31/2016  . PD (Parkinson's disease) ([place]) 08/08/2016  . Loss of weight 07/25/2016  . DM (diabetes mellitus), type 2 with renal complications 25/42/7062  . CKD (chronic kidney disease) stage 3, GFR 30-59 ml/min (HCC) 10/08/2012  . Vitamin D deficiency 08/24/2012  . Hypothyroid, s/p radioactive iodine tx for graves disease     Past Surgical History:  Procedure Laterality Date  . ABDOMINAL HYSTERECTOMY  1989  . CATARACT EXTRACTION, BILATERAL    . COLONOSCOPY    . DG BARIUM SWALLOW ([place] HX)     9 yrs ago per pt due to incomplete colonsocopy     OB History   No obstetric history on file.     Family History  Problem Relation Age of Onset  . Cancer Mother        liver  . Liver cancer Mother   . Stroke Father 2  . Diabetes Father   . Hypertension Father   . Diabetes Maternal Grandmother   . Prostate cancer Maternal Grandfather   . Pancreatic cancer Brother   . Diabetes Sister   . Cancer Sister   . Diabetes Sister   . Colon polyps Neg Hx   . Colon cancer Neg Hx   . Esophageal cancer Neg Hx   .  Rectal cancer Neg Hx   . Stomach cancer Neg Hx     Social History   Tobacco Use  . Smoking status: Never Smoker  . Smokeless tobacco: Never Used  Vaping Use  . Vaping Use: Never used  Substance Use Topics  . Alcohol use: No    Alcohol/week: 0.0 standard drinks  . Drug use: No    Home Medications Prior to Admission medications   Medication Sig Start Date End Date Taking? Authorizing Provider  aspirin 81 MG tablet Take 81 mg by mouth daily with lunch.     [provider]  atorvastatin (LIPITOR) 40 MG tablet Take 40 mg by mouth daily with lunch.     [provider]  Blood Pressure Monitoring (BLOOD PRESSURE CUFF) MISC 1 Product by Does not apply route as needed. 05/21/19   Lucretia Kern, DO  Calcium  Carbonate-Vitamin D (CALTRATE 600+D) 600-400 MG-UNIT per tablet Take 1 tablet by mouth 3 (three) times a week.     [provider]  carbidopa-levodopa (SINEMET IR) 25-100 MG tablet TAKE 2 TABLETS BY MOUTH 3 TIMES DAILY. 08/05/20   [name], Eustace Quail, DO  Cholecalciferol (VITAMIN D PO) Take 5,000 Units by mouth 3 (three) times a week.     [provider]  Cyanocobalamin (VITAMIN B12 PO) Take 1 tablet by mouth daily.    [provider]  glucose 4 GM chewable tablet Chew 1 tablet by mouth as needed for low blood sugar.    [provider]  glucose blood (ACCU-CHEK AVIVA PLUS) test strip Use to test blood sugar 4 times a day (Dx: E11.65) 12/25/19   [provider]  HUMALOG KWIKPEN 100 UNIT/ML KiwkPen Inject 0-4 Units into the skin 3 (three) times daily with meals. Sliding scale per Dr Altheimer's office notes from 09/24/2019 07/20/16   [provider]  hydrochlorothiazide (HYDRODIURIL) 25 MG tablet Take 25 mg by mouth daily with lunch.  06/29/12   [provider]  insulin glargine (LANTUS) 100 UNIT/ML injection Inject 15 Units into the skin every evening.     [provider]  Insulin Syringe-Needle U-100 (INSULIN SYRINGE .3CC/31GX5/16") 31G X 5/16" 0.3 ML MISC Use as directed for Lantus injection once a day 12/25/19   [provider]  levothyroxine (SYNTHROID, LEVOTHROID) 75 MCG tablet Take 75 mcg by mouth daily.    [provider]  losartan (COZAAR) 25 MG tablet TAKE 2 TABLETS BY MOUTH EVERY DAY 03/19/20   Koberlein, Steele Berg, MD  metFORMIN (GLUCOPHAGE-XR) 500 MG 24 hr tablet Take 1,000 mg by mouth daily with breakfast. Takes a few times a week    Altheimer, Legrand Como, MD  NONFORMULARY OR COMPOUNDED ITEM [facility]:  Antifungal - Terbinafine 3%, Fluconazole 2%, Tea Tree Oil 5%, Urea 10%, Ibuprofen 2% in DMSO Suspension #65ml. Apply to the affected nail(s) once (at bedtime) or twice daily. 10/31/18   Marzetta Board,  [name]  Nutritional Supplements (FEEDING SUPPLEMENT, GLUCERNA 1.2 CAL,) LIQD Place 237 mLs into feeding tube daily with breakfast.    [provider]  OneTouch Delica Lancets 05L MISC Use to test blood sugar 4 times a day (Dx: E11.65) 12/25/19   [provider]  Pioglitazone HCl (ACTOS PO) Take 45 mg by mouth daily with lunch.     [provider]  Polyethyl Glycol-Propyl Glycol (SYSTANE) 0.4-0.3 % GEL ophthalmic gel Place 1 application into both eyes at bedtime as needed (FOR EYES).    [provider]    Allergies  Patient has no known allergies.  Review of Systems   Review of Systems  Constitutional: Negative for fever.  HENT: Negative for congestion.   Eyes: Negative for visual disturbance.  Respiratory: Negative for shortness of breath.   Cardiovascular: Negative for chest pain.  Gastrointestinal: Negative for abdominal pain and vomiting.  Genitourinary: Negative for difficulty urinating.  Musculoskeletal: Positive for arthralgias. Negative for back pain and neck pain.  Skin: Negative for rash.  Neurological: Negative for dizziness, seizures and headaches.  Psychiatric/Behavioral: Negative for agitation.  All other systems reviewed and are negative.   Physical Exam Updated Vital Signs BP (!) 166/68 (BP Location: Right Arm)   Pulse 72   Temp 98.4 F (36.9 C) (Oral)   Resp 15   Ht 5\' 6"  (1.676 m)   Wt 65 kg   SpO2 99%   BMI 23.13 kg/m   Physical Exam Vitals and nursing note reviewed.  Constitutional:      Appearance: Normal appearance.  HENT:     Head: Normocephalic and atraumatic.     Nose: Nose normal.  Eyes:     Conjunctiva/sclera: Conjunctivae normal.     Pupils: Pupils are equal, round, and reactive to light.  Cardiovascular:     Rate and Rhythm: Normal rate and regular rhythm.     Pulses: Normal pulses.     Heart sounds: Normal heart sounds.  Pulmonary:     Effort: Pulmonary effort is normal.     Breath sounds: Normal  breath sounds.  Abdominal:     General: Abdomen is flat. Bowel sounds are normal.     Palpations: Abdomen is soft.     Tenderness: There is no abdominal tenderness. There is no guarding.  Musculoskeletal:     Left shoulder: Normal. No swelling, deformity, effusion, laceration, tenderness, bony tenderness or crepitus. Normal range of motion. Normal strength. Normal pulse.     Left upper arm: Normal.     Left elbow: Normal.     Left forearm: Normal.     Left wrist: Normal.     Cervical back: Normal range of motion and neck supple.     Comments: Negative Neers test of the LUE  Skin:    General: Skin is warm and dry.     Capillary Refill: Capillary refill takes less than 2 seconds.  Neurological:     General: No focal deficit present.     Mental Status: She is alert and oriented to person, place, and time.     Deep Tendon Reflexes: Reflexes normal.  Psychiatric:        Mood and Affect: Mood normal.        Behavior: Behavior normal.     ED Results / Procedures / Treatments   Labs (all labs ordered are listed, but only abnormal results are displayed)  EKG EKG Interpretation  Date/Time:  Friday August 14 2020 21:34:12 EST Ventricular Rate:  63 PR Interval:  130 QRS Duration: 86 QT Interval:  392 QTC Calculation: 401 R Axis:   55 Text Interpretation: Normal sinus rhythm Normal ECG Artifact RESOLVED SINCE PREVIOUS Otherwise no significant change Confirmed by Addison Lank [phone]) on 08/14/2020 11:02:34 PM   Radiology DG Shoulder Left  Result Date: 08/14/2020 CLINICAL DATA:  Pain status post fall EXAM: LEFT SHOULDER - 2+ VIEW COMPARISON:  None. FINDINGS: There is no evidence of fracture or dislocation. There is no evidence of arthropathy or other focal bone abnormality. Soft tissues are unremarkable. IMPRESSION: Negative. Electronically Signed   By: Harrell Gave  Green M.D.   On: 08/14/2020 22:11    Procedures Procedures (including critical care time)  Medications Ordered  in ED Medications  lidocaine (LIDODERM) 5 % 1 patch (1 patch Transdermal Patch Applied 08/15/20 0007)  acetaminophen (TYLENOL) tablet 1,000 mg (1,000 mg Oral Given 08/15/20 0006)  ibuprofen (ADVIL) tablet 800 mg (800 mg Oral Given 08/15/20 0007)    ED Course  I have reviewed the triage vital signs and the nursing notes.  Pertinent labs & imaging results that were available during my care of the patient were reviewed by me and considered in my medical decision making (see chart for details).    Exam is benign and reassuring.  Xrays are unremarkable.  Ice shoulder.  Will start voltaren gel and lidoderm.  Stable for discharge with close follow up.    MIHO MONDA was evaluated in Emergency Department on 08/15/2020 for the symptoms described in the history of present illness. She was evaluated in the context of the global COVID-19 pandemic, which necessitated consideration that the patient might be at risk for infection with the SARS-CoV-2 virus that causes COVID-19. Institutional protocols and algorithms that pertain to the evaluation of patients at risk for COVID-19 are in a state of rapid change based on information released by regulatory bodies including the CDC and federal and state organizations. These policies and algorithms were followed during the patient's care in the ED.  Final Clinical Impression(s) / ED Diagnoses Return for intractable cough, coughing up blood,fevers >100.4 unrelieved by medication, shortness of breath, intractable vomiting, chest pain, shortness of breath, weakness,numbness, changes in speech, facial asymmetry,abdominal pain, passing out,Inability to tolerate liquids or food, cough, altered mental status or any concerns. No signs of systemic illness or infection. The patient is nontoxic-appearing on exam and vital signs are within normal limits.   I have reviewed the triage vital signs and the nursing notes. Pertinent labs &imaging results that were  available during my care of the patient were reviewed by me and considered in my medical decision making (see chart for details).After history, exam, and medical workup I feel the patient has beenappropriately medically screened and is safe for discharge home. Pertinent diagnoses were discussed with the patient. Patient was given return precautions.    Laporsche Hoeger, MD 08/15/20 0020

## 2020-09-10 DIAGNOSIS — H353132 Nonexudative age-related macular degeneration, bilateral, intermediate dry stage: Secondary | ICD-10-CM | POA: Diagnosis not present

## 2020-09-10 DIAGNOSIS — H40013 Open angle with borderline findings, low risk, bilateral: Secondary | ICD-10-CM | POA: Diagnosis not present

## 2020-09-10 DIAGNOSIS — E113291 Type 2 diabetes mellitus with mild nonproliferative diabetic retinopathy without macular edema, right eye: Secondary | ICD-10-CM | POA: Diagnosis not present

## 2020-09-10 DIAGNOSIS — H35033 Hypertensive retinopathy, bilateral: Secondary | ICD-10-CM | POA: Diagnosis not present

## 2020-09-10 DIAGNOSIS — H524 Presbyopia: Secondary | ICD-10-CM | POA: Diagnosis not present

## 2020-09-10 LAB — HM DIABETES EYE EXAM

## 2020-10-13 DIAGNOSIS — E7800 Pure hypercholesterolemia, unspecified: Secondary | ICD-10-CM | POA: Diagnosis not present

## 2020-10-13 DIAGNOSIS — E1165 Type 2 diabetes mellitus with hyperglycemia: Secondary | ICD-10-CM | POA: Diagnosis not present

## 2020-10-13 DIAGNOSIS — N1831 Chronic kidney disease, stage 3a: Secondary | ICD-10-CM | POA: Diagnosis not present

## 2020-10-13 DIAGNOSIS — I10 Essential (primary) hypertension: Secondary | ICD-10-CM | POA: Diagnosis not present

## 2020-10-13 DIAGNOSIS — Z794 Long term (current) use of insulin: Secondary | ICD-10-CM | POA: Diagnosis not present

## 2020-10-15 ENCOUNTER — Encounter: Payer: Self-pay | Admitting: Family Medicine

## 2020-10-16 DIAGNOSIS — E890 Postprocedural hypothyroidism: Secondary | ICD-10-CM | POA: Diagnosis not present

## 2020-10-16 DIAGNOSIS — Z794 Long term (current) use of insulin: Secondary | ICD-10-CM | POA: Diagnosis not present

## 2020-10-16 DIAGNOSIS — E1165 Type 2 diabetes mellitus with hyperglycemia: Secondary | ICD-10-CM | POA: Diagnosis not present

## 2020-10-16 DIAGNOSIS — E1122 Type 2 diabetes mellitus with diabetic chronic kidney disease: Secondary | ICD-10-CM | POA: Diagnosis not present

## 2020-10-16 DIAGNOSIS — E113293 Type 2 diabetes mellitus with mild nonproliferative diabetic retinopathy without macular edema, bilateral: Secondary | ICD-10-CM | POA: Diagnosis not present

## 2020-10-16 DIAGNOSIS — I129 Hypertensive chronic kidney disease with stage 1 through stage 4 chronic kidney disease, or unspecified chronic kidney disease: Secondary | ICD-10-CM | POA: Diagnosis not present

## 2020-10-16 DIAGNOSIS — N1831 Chronic kidney disease, stage 3a: Secondary | ICD-10-CM | POA: Diagnosis not present

## 2020-10-16 DIAGNOSIS — E790 Hyperuricemia without signs of inflammatory arthritis and tophaceous disease: Secondary | ICD-10-CM | POA: Diagnosis not present

## 2020-10-16 DIAGNOSIS — E7800 Pure hypercholesterolemia, unspecified: Secondary | ICD-10-CM | POA: Diagnosis not present

## 2020-11-02 DIAGNOSIS — D49512 Neoplasm of unspecified behavior of left kidney: Secondary | ICD-10-CM | POA: Diagnosis not present

## 2020-11-16 ENCOUNTER — Other Ambulatory Visit: Payer: Self-pay | Admitting: Family Medicine

## 2020-11-18 NOTE — Progress Notes (Unsigned)
Assessment/Plan:   1.  Parkinsons Disease  -Carbidopa/levodopa 25/100, 2 tablets 3 times per day.  Move dosages closer together to 8am/noon/4pm.  2.  RBD  -Doing fairly well in that regard.  Does not want any medication.  3.  Depression  -start mirtazapine, 15 mg q hs for depression and insomnia.  R/B/SE were discussed.  The opportunity to ask questions was given and they were answered to the best of my ability.  The patient expressed understanding and willingness to follow the outlined treatment protocols.  4.  MCI  -Had neurocognitive testing with Dr. Melvyn Novas in June, 2021.  Will need to watch as she states that she keeps on the radio "looking for aliens."  Told her to turn off radio.   Subjective:   Pamela Patton was seen today in follow up for Parkinsons disease.  My previous records were reviewed prior to todays visit as well as outside records available to me. Pt was in the emergency room in November after a fall.  She was hurrying outside to get out of the way of oncoming traffic.  She hit her left shoulder.  Pt denies lightheadedness, near syncope.  No hallucinations.  Mood has been depressed.  No SI.  She is having trouble sleeping, both because of sleep and because she keeps on her radio "looking for aliens."  Current prescribed movement disorder medications: Carbidopa/levodopa 25/100, 2 tablets 3 times per day (8am/3pm/7:30pm)   PREVIOUS MEDICATIONS: none to date  ALLERGIES:  No Known Allergies  CURRENT MEDICATIONS:  Outpatient Encounter Medications as of 11/20/2020  Medication Sig  . aspirin 81 MG tablet Take 81 mg by mouth daily with lunch.   Marland Kitchen atorvastatin (LIPITOR) 40 MG tablet Take 40 mg by mouth daily with lunch.   . Blood Pressure Monitoring (BLOOD PRESSURE CUFF) MISC 1 Product by Does not apply route as needed.  . Calcium Carbonate-Vitamin D 600-400 MG-UNIT tablet Take 1 tablet by mouth 3 (three) times a week.   . carbidopa-levodopa (SINEMET IR) 25-100 MG tablet  TAKE 2 TABLETS BY MOUTH 3 TIMES DAILY.  Marland Kitchen Cholecalciferol (VITAMIN D PO) Take 5,000 Units by mouth 3 (three) times a week.   . Cyanocobalamin (VITAMIN B12 PO) Take 1 tablet by mouth daily.  . diclofenac Sodium (VOLTAREN) 1 % GEL Apply 4 g topically 4 (four) times daily.  Marland Kitchen glucose 4 GM chewable tablet Chew 1 tablet by mouth as needed for low blood sugar.  Marland Kitchen glucose blood (ACCU-CHEK AVIVA PLUS) test strip Use to test blood sugar 4 times a day (Dx: E11.65)  . HUMALOG KWIKPEN 100 UNIT/ML KiwkPen Inject 0-4 Units into the skin 3 (three) times daily with meals. Sliding scale per Dr Altheimer's office notes from 09/24/2019  . hydrochlorothiazide (HYDRODIURIL) 25 MG tablet Take 25 mg by mouth daily with lunch.   . insulin glargine (LANTUS) 100 UNIT/ML injection Inject 15 Units into the skin every evening.   . Insulin Syringe-Needle U-100 (INSULIN SYRINGE .3CC/31GX5/16") 31G X 5/16" 0.3 ML MISC Use as directed for Lantus injection once a day  . levothyroxine (SYNTHROID, LEVOTHROID) 75 MCG tablet Take 75 mcg by mouth daily.  Marland Kitchen lidocaine (LIDODERM) 5 % Place 1 patch onto the skin daily. Remove & Discard patch within 12 hours or as directed by MD  . losartan (COZAAR) 25 MG tablet TAKE 2 TABLETS BY MOUTH EVERY DAY  . metFORMIN (GLUCOPHAGE-XR) 500 MG 24 hr tablet Take 1,000 mg by mouth daily with breakfast. Takes a few times  a week  . NONFORMULARY OR COMPOUNDED ITEM [facility]:  Antifungal - Terbinafine 3%, Fluconazole 2%, Tea Tree Oil 5%, Urea 10%, Ibuprofen 2% in DMSO Suspension #55ml. Apply to the affected nail(s) once (at bedtime) or twice daily.  . Nutritional Supplements (FEEDING SUPPLEMENT, GLUCERNA 1.2 CAL,) LIQD Place 237 mLs into feeding tube daily with breakfast.  . OneTouch Delica Lancets 16X MISC Use to test blood sugar 4 times a day (Dx: E11.65)  . Pioglitazone HCl (ACTOS PO) Take 45 mg by mouth daily with lunch.   Vladimir Faster Glycol-Propyl Glycol (SYSTANE) 0.4-0.3 % GEL ophthalmic gel  Place 1 application into both eyes at bedtime as needed (FOR EYES).   Facility-Administered Encounter Medications as of 11/20/2020  Medication  . 0.9 %  sodium chloride infusion    Objective:   PHYSICAL EXAMINATION:    VITALS:   Vitals:   11/20/20 0815  BP: (!) 102/58  Pulse: 77  Resp: 18  SpO2: 96%  Weight: 133 lb (60.3 kg)  Height: 5\' 6"  (1.676 m)    GEN:  The patient appears stated age and is in NAD. HEENT:  Normocephalic, atraumatic.  The mucous membranes are moist. The superficial temporal arteries are without ropiness or tenderness. CV:  RRR Lungs:  CTAB Neck/HEME:  There are no carotid bruits bilaterally.  Neurological examination:  Orientation: The patient is alert and oriented x3. Cranial nerves: There is good facial symmetry with facial hypomimia. The speech is fluent and pseudbulbar and somewhat dysarthric. Soft palate rises symmetrically and there is no tongue deviation. Hearing is intact to conversational tone. Sensation: Sensation is intact to light touch throughout Motor: Strength is at least antigravity x4.  Movement examination: Tone: There is nl tone in the UE/LE Abnormal movements: none Coordination:  There is mild decremation with RAM's, with finger taps bilaterally Gait and Station: The patient has min difficulty arising out of a deep-seated chair without the use of the hands. The patient's stride length is good.  There is decreased arm swing on the L.   I have reviewed and interpreted the following labs independently    Chemistry      Component Value Date/Time   NA 142 08/14/2020 2144   NA 142 04/27/2017 0000   K 4.3 08/14/2020 2144   CL 109 08/14/2020 2144   CO2 23 08/14/2020 2144   BUN 31 (H) 08/14/2020 2144   BUN 23 (A) 04/27/2017 0000   CREATININE 1.53 (H) 08/14/2020 2144   GLU 154 04/27/2017 0000      Component Value Date/Time   CALCIUM 9.4 08/14/2020 2144   ALKPHOS 73 08/11/2020 1116   AST 14 08/11/2020 1116   ALT 5 08/11/2020  1116   BILITOT 0.5 08/11/2020 1116       Lab Results  Component Value Date   WBC 9.5 08/14/2020   HGB 9.9 (L) 08/14/2020   HCT 32.2 (L) 08/14/2020   MCV 95.8 08/14/2020   PLT 272 08/14/2020      Total time spent on today's visit was 30 minutes, including both face-to-face time and nonface-to-face time.  Time included that spent on review of records (prior notes available to me/labs/imaging if pertinent), discussing treatment and goals, answering patient's questions and coordinating care.  Cc:  Caren Macadam, MD

## 2020-11-20 ENCOUNTER — Other Ambulatory Visit: Payer: Self-pay

## 2020-11-20 ENCOUNTER — Ambulatory Visit: Payer: Medicare PPO | Admitting: Neurology

## 2020-11-20 ENCOUNTER — Encounter: Payer: Self-pay | Admitting: Neurology

## 2020-11-20 VITALS — BP 102/58 | HR 77 | Resp 18 | Ht 66.0 in | Wt 133.0 lb

## 2020-11-20 DIAGNOSIS — F330 Major depressive disorder, recurrent, mild: Secondary | ICD-10-CM | POA: Diagnosis not present

## 2020-11-20 DIAGNOSIS — F5101 Primary insomnia: Secondary | ICD-10-CM

## 2020-11-20 DIAGNOSIS — G20 Parkinson's disease: Secondary | ICD-10-CM | POA: Diagnosis not present

## 2020-11-20 MED ORDER — CARBIDOPA-LEVODOPA 25-100 MG PO TABS
2.0000 | ORAL_TABLET | Freq: Three times a day (TID) | ORAL | 1 refills | Status: DC
Start: 2020-11-20 — End: 2021-05-04

## 2020-11-20 MED ORDER — MIRTAZAPINE 15 MG PO TABS: 15.0000 mg | ORAL_TABLET | Freq: Every day | ORAL | 1 refills | Status: DC

## 2020-11-20 NOTE — Patient Instructions (Addendum)
1.  Take carbidopa/levodopa 25/100, 2 tablets three times per day.  Move dosages closer together to 8am/noon/4pm. 2.  Exercise!  The physicians and staff at [facility] are committed to providing excellent care. You may receive a survey requesting feedback about your experience at our office. We strive to receive "very good" responses to the survey questions. If you feel that your experience would prevent you from giving the office a "very good " response, please contact our office to try to remedy the situation. We may be reached at [phone]. Thank you for taking the time out of your busy day to complete the survey.

## 2021-02-18 ENCOUNTER — Other Ambulatory Visit (HOSPITAL_COMMUNITY): Payer: Self-pay | Admitting: Urology

## 2021-02-18 DIAGNOSIS — D49512 Neoplasm of unspecified behavior of left kidney: Secondary | ICD-10-CM

## 2021-02-26 DIAGNOSIS — N1831 Chronic kidney disease, stage 3a: Secondary | ICD-10-CM | POA: Diagnosis not present

## 2021-02-26 DIAGNOSIS — Z794 Long term (current) use of insulin: Secondary | ICD-10-CM | POA: Diagnosis not present

## 2021-02-26 DIAGNOSIS — E890 Postprocedural hypothyroidism: Secondary | ICD-10-CM | POA: Diagnosis not present

## 2021-02-26 DIAGNOSIS — E1165 Type 2 diabetes mellitus with hyperglycemia: Secondary | ICD-10-CM | POA: Diagnosis not present

## 2021-02-26 DIAGNOSIS — E559 Vitamin D deficiency, unspecified: Secondary | ICD-10-CM | POA: Diagnosis not present

## 2021-02-26 DIAGNOSIS — Z79899 Other long term (current) drug therapy: Secondary | ICD-10-CM | POA: Diagnosis not present

## 2021-02-26 DIAGNOSIS — E7800 Pure hypercholesterolemia, unspecified: Secondary | ICD-10-CM | POA: Diagnosis not present

## 2021-02-26 DIAGNOSIS — I10 Essential (primary) hypertension: Secondary | ICD-10-CM | POA: Diagnosis not present

## 2021-03-02 ENCOUNTER — Encounter (HOSPITAL_COMMUNITY): Payer: Self-pay

## 2021-03-02 ENCOUNTER — Ambulatory Visit (HOSPITAL_COMMUNITY): Admission: RE | Admit: 2021-03-02 | Payer: Medicare PPO | Source: Ambulatory Visit

## 2021-03-03 DIAGNOSIS — H5789 Other specified disorders of eye and adnexa: Secondary | ICD-10-CM | POA: Diagnosis not present

## 2021-03-03 DIAGNOSIS — Z794 Long term (current) use of insulin: Secondary | ICD-10-CM | POA: Diagnosis not present

## 2021-03-03 DIAGNOSIS — E113293 Type 2 diabetes mellitus with mild nonproliferative diabetic retinopathy without macular edema, bilateral: Secondary | ICD-10-CM | POA: Diagnosis not present

## 2021-03-03 DIAGNOSIS — E1122 Type 2 diabetes mellitus with diabetic chronic kidney disease: Secondary | ICD-10-CM | POA: Diagnosis not present

## 2021-03-03 DIAGNOSIS — I129 Hypertensive chronic kidney disease with stage 1 through stage 4 chronic kidney disease, or unspecified chronic kidney disease: Secondary | ICD-10-CM | POA: Diagnosis not present

## 2021-03-03 DIAGNOSIS — N1831 Chronic kidney disease, stage 3a: Secondary | ICD-10-CM | POA: Diagnosis not present

## 2021-03-03 DIAGNOSIS — E1165 Type 2 diabetes mellitus with hyperglycemia: Secondary | ICD-10-CM | POA: Diagnosis not present

## 2021-03-03 DIAGNOSIS — E790 Hyperuricemia without signs of inflammatory arthritis and tophaceous disease: Secondary | ICD-10-CM | POA: Diagnosis not present

## 2021-03-03 DIAGNOSIS — E7800 Pure hypercholesterolemia, unspecified: Secondary | ICD-10-CM | POA: Diagnosis not present

## 2021-04-27 ENCOUNTER — Other Ambulatory Visit: Payer: Self-pay

## 2021-04-27 ENCOUNTER — Ambulatory Visit (HOSPITAL_COMMUNITY)
Admission: RE | Admit: 2021-04-27 | Discharge: 2021-04-27 | Disposition: A | Discharge status: Home / Self Care | Payer: Medicare PPO | Source: Ambulatory Visit | Attending: Urology | Admitting: Urology

## 2021-04-27 DIAGNOSIS — D49512 Neoplasm of unspecified behavior of left kidney: Secondary | ICD-10-CM | POA: Diagnosis not present

## 2021-04-27 DIAGNOSIS — C642 Malignant neoplasm of left kidney, except renal pelvis: Secondary | ICD-10-CM | POA: Diagnosis not present

## 2021-04-27 DIAGNOSIS — N281 Cyst of kidney, acquired: Secondary | ICD-10-CM | POA: Diagnosis not present

## 2021-04-27 MED ORDER — GADOBUTROL 1 MMOL/ML IV SOLN
6.0000 mL | Freq: Once | INTRAVENOUS | Status: AC | PRN
  Administered 2021-04-27: 6 mL via INTRAVENOUS

## 2021-05-03 NOTE — Progress Notes (Signed)
Assessment/Plan:   1.  Parkinsons Disease  -Continue carbidopa/levodopa 25/100, 2 tablets 3 times per day.  2.  RBD             -Doing fairly well in that regard.  Does not want any medication.   3.  Depression/insomnia             -mirtatazapine given but not taken but thinks that doing well in both aspects.   4.  MCI             -Had neurocognitive testing with Dr. Melvyn Novas in June, 2021.  hallucinations/delusions resolved on own  Subjective:   Pamela Patton was seen today in follow up for Parkinsons disease.  My previous records were reviewed prior to todays visit as well as outside records available to me.  pt denies falls.  Pt denies lightheadedness, near syncope.  No hallucinations any longer.  Mood has been good - "I've been doing better in my attitude and i've been sleeping better."  Admits that she is not taking the mirtazapine because "I'm taking so many medications."  States that she listened to advice last visit and turned the radio off at night and she is unwinding at night before bed.  She is taking her levodopa on a better schedule.  Current prescribed movement disorder medications: Carbidopa/levodopa 25/100, 2 tablets 3 times per day Mirtazapine, 15 mg at bedtime (started last visit)   PREVIOUS MEDICATIONS: mirtazapine (given but not taken)  ALLERGIES:  No Known Allergies  CURRENT MEDICATIONS:  Outpatient Encounter Medications as of 05/04/2021  Medication Sig   aspirin 81 MG tablet Take 81 mg by mouth daily with lunch.    atorvastatin (LIPITOR) 40 MG tablet Take 40 mg by mouth daily with lunch.    Calcium Carbonate-Vitamin D 600-400 MG-UNIT tablet Take 1 tablet by mouth 3 (three) times a week.    carbidopa-levodopa (SINEMET IR) 25-100 MG tablet Take 2 tablets by mouth 3 (three) times daily. 8am/noon/4pm.   Cholecalciferol (VITAMIN D PO) Take 5,000 Units by mouth 3 (three) times a week.    Cyanocobalamin (VITAMIN B12 PO) Take 1 tablet by mouth daily.   glucose 4 GM  chewable tablet Chew 1 tablet by mouth as needed for low blood sugar.   glucose blood (ACCU-CHEK AVIVA PLUS) test strip Use to test blood sugar 4 times a day (Dx: E11.65)   HUMALOG KWIKPEN 100 UNIT/ML KiwkPen Inject 0-4 Units into the skin 3 (three) times daily with meals. Sliding scale per Dr Altheimer's office notes from 09/24/2019   hydrochlorothiazide (HYDRODIURIL) 25 MG tablet Take 25 mg by mouth daily with lunch.    insulin glargine (LANTUS) 100 UNIT/ML injection Inject 15 Units into the skin every evening.    levothyroxine (SYNTHROID, LEVOTHROID) 75 MCG tablet Take 75 mcg by mouth daily.   losartan (COZAAR) 25 MG tablet TAKE 2 TABLETS BY MOUTH EVERY DAY   mirtazapine (REMERON) 15 MG tablet Take 1 tablet (15 mg total) by mouth at bedtime.   Blood Pressure Monitoring (BLOOD PRESSURE CUFF) MISC 1 Product by Does not apply route as needed.   Insulin Syringe-Needle U-100 (INSULIN SYRINGE .3CC/31GX5/16") 31G X 5/16" 0.3 ML MISC Use as directed for Lantus injection once a day   NONFORMULARY OR COMPOUNDED ITEM [facility]:  Antifungal - Terbinafine 3%, Fluconazole 2%, Tea Tree Oil 5%, Urea 10%, Ibuprofen 2% in DMSO Suspension #4m. Apply to the affected nail(s) once (at bedtime) or twice daily.   Nutritional Supplements (FEEDING  SUPPLEMENT, GLUCERNA 1.2 CAL,) LIQD Place 237 mLs into feeding tube daily with breakfast.   OneTouch Delica Lancets 99991111 MISC Use to test blood sugar 4 times a day (Dx: E11.65)   Pioglitazone HCl (ACTOS PO) Take 45 mg by mouth daily with lunch.    Polyethyl Glycol-Propyl Glycol (SYSTANE) 0.4-0.3 % GEL ophthalmic gel Place 1 application into both eyes at bedtime as needed (FOR EYES).   [DISCONTINUED] diclofenac Sodium (VOLTAREN) 1 % GEL Apply 4 g topically 4 (four) times daily. (Patient not taking: Reported on 05/04/2021)   [DISCONTINUED] lidocaine (LIDODERM) 5 % Place 1 patch onto the skin daily. Remove & Discard patch within 12 hours or as directed by MD (Patient not  taking: Reported on 05/04/2021)   [DISCONTINUED] metFORMIN (GLUCOPHAGE-XR) 500 MG 24 hr tablet Take 1,000 mg by mouth daily with breakfast. Takes a few times a week (Patient not taking: Reported on 05/04/2021)   Facility-Administered Encounter Medications as of 05/04/2021  Medication   0.9 %  sodium chloride infusion    Objective:   PHYSICAL EXAMINATION:    VITALS:   Vitals:   05/04/21 0820  BP: 110/66  Pulse: 97  SpO2: 96%  Weight: 132 lb (59.9 kg)  Height: '5\' 6"'$  (1.676 m)    GEN:  The patient appears stated age and is in NAD. HEENT:  Normocephalic, atraumatic.  The mucous membranes are moist. The superficial temporal arteries are without ropiness or tenderness. CV:  RRR Lungs:  CTAB Neck/HEME:  There are no carotid bruits bilaterally.  Neurological examination:  Orientation: The patient is alert and oriented x3. Cranial nerves: There is good facial symmetry with mild facial hypomimia. The speech is fluent and clear but pseudobulbar. Soft palate rises symmetrically and there is no tongue deviation. Hearing is intact to conversational tone. Sensation: Sensation is intact to light touch throughout Motor: Strength is at least antigravity x4.  Movement examination: Tone: There is nl tone in the UE/LE Abnormal movements: none Coordination:  There is slowness with finger taps bilaterally but able to do all other RAM's without trouble.  Gait and Station: Patient pushes off of the chair to arise.  The patient's stride length is good.   I have reviewed and interpreted the following labs independently    Chemistry      Component Value Date/Time   NA 142 08/14/2020 2144   NA 142 04/27/2017 0000   K 4.3 08/14/2020 2144   CL 109 08/14/2020 2144   CO2 23 08/14/2020 2144   BUN 31 (H) 08/14/2020 2144   BUN 23 (A) 04/27/2017 0000   CREATININE 1.53 (H) 08/14/2020 2144   GLU 154 04/27/2017 0000      Component Value Date/Time   CALCIUM 9.4 08/14/2020 2144   ALKPHOS 73 08/11/2020 1116    AST 14 08/11/2020 1116   ALT 5 08/11/2020 1116   BILITOT 0.5 08/11/2020 1116       Lab Results  Component Value Date   WBC 9.5 08/14/2020   HGB 9.9 (L) 08/14/2020   HCT 32.2 (L) 08/14/2020   MCV 95.8 08/14/2020   PLT 272 08/14/2020    Lab Results  Component Value Date   TSH 0.30 (A) 04/27/2017     Total time spent on today's visit was 20 minutes, including both face-to-face time and nonface-to-face time.  Time included that spent on review of records (prior notes available to me/labs/imaging if pertinent), discussing treatment and goals, answering patient's questions and coordinating care.  Cc:  Caren Macadam, MD

## 2021-05-04 ENCOUNTER — Ambulatory Visit: Payer: Medicare PPO | Admitting: Neurology

## 2021-05-04 ENCOUNTER — Other Ambulatory Visit: Payer: Self-pay

## 2021-05-04 ENCOUNTER — Encounter: Payer: Self-pay | Admitting: Neurology

## 2021-05-04 VITALS — BP 110/66 | HR 97 | Ht 66.0 in | Wt 132.0 lb

## 2021-05-04 DIAGNOSIS — G3184 Mild cognitive impairment, so stated: Secondary | ICD-10-CM | POA: Diagnosis not present

## 2021-05-04 DIAGNOSIS — G20 Parkinson's disease: Secondary | ICD-10-CM | POA: Diagnosis not present

## 2021-05-04 MED ORDER — CARBIDOPA-LEVODOPA 25-100 MG PO TABS: 2.0000 | ORAL_TABLET | Freq: Three times a day (TID) | ORAL | 1 refills | Status: DC

## 2021-05-04 NOTE — Patient Instructions (Signed)
Parkinson's Disease Parkinson's disease causes problems with movements. It is a long-term condition. It gets worse over time (is progressive). It affects each person in different ways. It makes it harder for you to: Control how your body moves. Move your body normally. The condition can range from mild to very bad (advanced). What are the causes? This condition results from a loss of brain cells called neurons. These brain cells make a chemical called dopamine, which is needed to control body movement. As the condition gets worse, the brain cells make less dopamine. Thismakes it hard to move or control your movements. The exact cause of this condition is not known. What increases the risk? Being female. Being age 49 or older. Having family members who had Parkinson's disease. Having had an injury to the brain. Being very sad (depressed). Being around things that are harmful or poisonous. What are the signs or symptoms? Symptoms of this condition can vary. The main symptoms have to do with movement. These include: A tremor or shaking while you are resting that you cannot control. Stiffness in your neck, arms, and legs. Slowing of movement. This may include: Losing expressions of the face. Having trouble making small movements that are needed to button your clothing or brush your teeth. Walking in a way that is not normal. You may walk with short, shuffling steps. Loss of balance when standing. You may sway, fall backward, or have trouble making turns. Other symptoms include: Being very sad, worried, or confused. Seeing or hearing things that are not real. Losing thinking abilities (dementia). Trouble speaking or swallowing. Having a hard time pooping (constipation). Needing to pee right away, peeing often, or not being able to control when you pee or poop. Sleep problems. How is this treated? There is no cure. The goal of treatment is to manage your symptoms. Treatment may  include: Medicines. Therapy to help with talking or movement. Surgery to reduce shaking and other movements that you cannot control. Follow these instructions at home: Medicines Take over-the-counter and prescription medicines only as told by your doctor. Avoid taking pain or sleeping medicines. Eating and drinking Follow instructions from your doctor about what you cannot eat or drink. Do not drink alcohol. Activity Talk with your doctor about if it is safe for you to drive. Do exercises as told by your doctor. Lifestyle     Put in grab bars and railings in your home. These help to prevent falls. Do not use any products that contain nicotine or tobacco, such as cigarettes, e-cigarettes, and chewing tobacco. If you need help quitting, ask your doctor. Join a support group. General instructions Talk with your doctor about what you need help with and what your safety needs are. Keep all follow-up visits as told by your doctor, including any therapy visits to help with talking or moving. This is important. Contact a doctor if: Medicines do not help your symptoms. You feel off-balance. You fall at home. You need more help at home. You have trouble swallowing. You have a very hard time pooping. You have a lot of side effects from your medicines. You feel very sad, worried, or confused. Get help right away if: You were hurt in a fall. You see or hear things that are not real. You cannot swallow without choking. You have chest pain or trouble breathing. You do not feel safe at home. You have thoughts about hurting yourself or others. If you ever feel like you may hurt yourself or others, or have  thoughts about taking your own life, get help right away. You can go to your nearest emergency department or call: Your local emergency services (911 in the [place] A suicide crisis helpline, such as the [facility] at 707-641-0232. This is open 24 hours a  day. Summary This condition causes problems with movements. It is a long-term condition. It gets worse over time. There is no cure. Treatment focuses on managing your symptoms. Talk with your doctor about what you need help with and what your safety needs are. Keep all follow-up visits as told by your doctor. This is important. This information is not intended to replace advice given to you by your health care provider. Make sure you discuss any questions you have with your healthcare provider. Document Revised: 09/01/2020 Document Reviewed: 12/06/2018 Elsevier Patient Education  [place].

## 2021-06-10 DIAGNOSIS — D3002 Benign neoplasm of left kidney: Secondary | ICD-10-CM | POA: Diagnosis not present

## 2021-06-21 DIAGNOSIS — Z794 Long term (current) use of insulin: Secondary | ICD-10-CM | POA: Diagnosis not present

## 2021-06-21 DIAGNOSIS — E1165 Type 2 diabetes mellitus with hyperglycemia: Secondary | ICD-10-CM | POA: Diagnosis not present

## 2021-06-21 DIAGNOSIS — E7800 Pure hypercholesterolemia, unspecified: Secondary | ICD-10-CM | POA: Diagnosis not present

## 2021-06-21 DIAGNOSIS — I10 Essential (primary) hypertension: Secondary | ICD-10-CM | POA: Diagnosis not present

## 2021-06-21 DIAGNOSIS — E890 Postprocedural hypothyroidism: Secondary | ICD-10-CM | POA: Diagnosis not present

## 2021-06-21 DIAGNOSIS — N1831 Chronic kidney disease, stage 3a: Secondary | ICD-10-CM | POA: Diagnosis not present

## 2021-06-22 DIAGNOSIS — I129 Hypertensive chronic kidney disease with stage 1 through stage 4 chronic kidney disease, or unspecified chronic kidney disease: Secondary | ICD-10-CM | POA: Diagnosis not present

## 2021-06-22 DIAGNOSIS — N1831 Chronic kidney disease, stage 3a: Secondary | ICD-10-CM | POA: Diagnosis not present

## 2021-06-22 DIAGNOSIS — E1169 Type 2 diabetes mellitus with other specified complication: Secondary | ICD-10-CM | POA: Diagnosis not present

## 2021-06-22 DIAGNOSIS — E1165 Type 2 diabetes mellitus with hyperglycemia: Secondary | ICD-10-CM | POA: Diagnosis not present

## 2021-06-22 DIAGNOSIS — E890 Postprocedural hypothyroidism: Secondary | ICD-10-CM | POA: Diagnosis not present

## 2021-06-22 DIAGNOSIS — E113293 Type 2 diabetes mellitus with mild nonproliferative diabetic retinopathy without macular edema, bilateral: Secondary | ICD-10-CM | POA: Diagnosis not present

## 2021-06-22 DIAGNOSIS — E1122 Type 2 diabetes mellitus with diabetic chronic kidney disease: Secondary | ICD-10-CM | POA: Diagnosis not present

## 2021-06-22 DIAGNOSIS — E11649 Type 2 diabetes mellitus with hypoglycemia without coma: Secondary | ICD-10-CM | POA: Diagnosis not present

## 2021-06-22 DIAGNOSIS — E785 Hyperlipidemia, unspecified: Secondary | ICD-10-CM | POA: Diagnosis not present

## 2021-07-06 ENCOUNTER — Ambulatory Visit: Payer: Medicare PPO

## 2021-07-06 ENCOUNTER — Other Ambulatory Visit: Payer: Self-pay

## 2021-07-06 NOTE — Progress Notes (Deleted)
Virtual Visit via Telephone Note  I connected with  [name] on 07/06/21 at 11:00 AM EDT by telephone and verified that I am speaking with the correct person using two identifiers.  Medicare Annual Wellness visit completed telephonically due to Covid-19 pandemic.   Persons participating in this call: This Health Coach and this patient. ***  Location: Patient: *** Provider: ***   I discussed the limitations, risks, security and privacy concerns of performing an evaluation and management service by telephone and the availability of in person appointments. The patient expressed understanding and agreed to proceed.  Unable to perform video visit due to video visit attempted and failed and/or patient does not have video capability.   Some vital signs may be absent or patient reported.   Willette Brace, LPN   Subjective:   VILLA BURGIN is a 75 y.o. female who presents for Medicare Annual (Subsequent) preventive examination.  Review of Systems    ***       Objective:    There were no vitals filed for this visit. There is no height or weight on file to calculate BMI.  Advanced Directives 05/04/2021 11/20/2020 08/14/2020 06/30/2020 05/19/2020 12/10/2019 07/02/2019  Does Patient Have a Medical Advance Directive? Yes Yes No Yes Yes Yes Yes  Type of Advance Directive Living will - - Living will Living will - Living will;Healthcare Power of Attorney  Would patient like information on creating a medical advance directive? - - No - Patient declined - - - -    Current Medications (verified) Outpatient Encounter Medications as of 07/06/2021  Medication Sig   aspirin 81 MG tablet Take 81 mg by mouth daily with lunch.    atorvastatin (LIPITOR) 40 MG tablet Take 40 mg by mouth daily with lunch.    Blood Pressure Monitoring (BLOOD PRESSURE CUFF) MISC 1 Product by Does not apply route as needed.   Calcium Carbonate-Vitamin D 600-400 MG-UNIT tablet Take 1 tablet by mouth 3 (three) times a week.     carbidopa-levodopa (SINEMET IR) 25-100 MG tablet Take 2 tablets by mouth 3 (three) times daily. 8am/noon/4pm.   Cholecalciferol (VITAMIN D PO) Take 5,000 Units by mouth 3 (three) times a week.    Cyanocobalamin (VITAMIN B12 PO) Take 1 tablet by mouth daily.   glucose 4 GM chewable tablet Chew 1 tablet by mouth as needed for low blood sugar.   glucose blood (ACCU-CHEK AVIVA PLUS) test strip Use to test blood sugar 4 times a day (Dx: E11.65)   HUMALOG KWIKPEN 100 UNIT/ML KiwkPen Inject 0-4 Units into the skin 3 (three) times daily with meals. Sliding scale per Dr Altheimer's office notes from 09/24/2019   hydrochlorothiazide (HYDRODIURIL) 25 MG tablet Take 25 mg by mouth daily with lunch.    insulin glargine (LANTUS) 100 UNIT/ML injection Inject 15 Units into the skin every evening.    Insulin Syringe-Needle U-100 (INSULIN SYRINGE .3CC/31GX5/16") 31G X 5/16" 0.3 ML MISC Use as directed for Lantus injection once a day   levothyroxine (SYNTHROID, LEVOTHROID) 75 MCG tablet Take 75 mcg by mouth daily.   losartan (COZAAR) 25 MG tablet TAKE 2 TABLETS BY MOUTH EVERY DAY   mirtazapine (REMERON) 15 MG tablet Take 1 tablet (15 mg total) by mouth at bedtime.   NONFORMULARY OR COMPOUNDED ITEM [facility]:  Antifungal - Terbinafine 3%, Fluconazole 2%, Tea Tree Oil 5%, Urea 10%, Ibuprofen 2% in DMSO Suspension #48ml. Apply to the affected nail(s) once (at bedtime) or twice daily.   Nutritional Supplements (  FEEDING SUPPLEMENT, GLUCERNA 1.2 CAL,) LIQD Place 237 mLs into feeding tube daily with breakfast.   OneTouch Delica Lancets 50Y MISC Use to test blood sugar 4 times a day (Dx: E11.65)   Pioglitazone HCl (ACTOS PO) Take 45 mg by mouth daily with lunch.    Polyethyl Glycol-Propyl Glycol (SYSTANE) 0.4-0.3 % GEL ophthalmic gel Place 1 application into both eyes at bedtime as needed (FOR EYES).   Facility-Administered Encounter Medications as of 07/06/2021  Medication   0.9 %  sodium chloride infusion     Allergies (verified) Patient has no known allergies.   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   Carotid artery disease 03/21/2017   Cataract    immature    Chest pain summer 2012   Chicken pox    CKD (chronic kidney disease) 10/08/2012   Stage 3, GFR 30-59 ml/min   Diverticulosis    difficulty with colonoscopy 2005   DM (diabetes mellitus), type 2 with renal complications 77/41/2878   Fracture    radial/ulnar 03/2016   GERD (gastroesophageal reflux disease)    Heart murmur    innocent murmur   High cholesterol    Hypertension associated with diabetes 02/22/2017   Hyperuricemia 10/31/2016   Hypothyroidism    s/p radioactive iodine tx for graves in 2001   Intestinal metaplasia of gastric mucosa    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   Parkinson's disease 08/08/2016   UTI (urinary tract infection)    Vitamin D deficiency 08/24/2012   Weight loss    eval with GI in 2016; s/p CT/MRI and PET scans   Past Surgical History:  Procedure Laterality Date   ABDOMINAL HYSTERECTOMY  1989   CATARACT EXTRACTION, BILATERAL     COLONOSCOPY     DG BARIUM SWALLOW ([place] HX)     9 yrs ago per pt due to incomplete colonsocopy   Family History  Problem Relation Age of Onset   Cancer Mother        liver   Liver cancer Mother    Stroke Father 20   Diabetes Father    Hypertension Father    Diabetes Maternal Grandmother    Prostate cancer Maternal Grandfather    Pancreatic cancer Brother    Diabetes Sister    Cancer Sister    Diabetes Sister    Colon polyps Neg Hx    Colon cancer Neg Hx    Esophageal cancer Neg Hx    Rectal cancer Neg Hx    Stomach cancer Neg Hx    Social History   Socioeconomic History   Marital status: Single    Spouse name: Not on file   Number of children: 1   Years of education: 20   Highest education level: Professional school degree (e.g., MD, DDS, DVM, JD)  Occupational  History   Occupation: retired    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[employer], histology, anatomy and physiology  Tobacco Use   Smoking status: Never   Smokeless tobacco: Never  Vaping Use   Vaping Use: Never used  Substance and Sexual Activity   Alcohol use: No    Alcohol/week: 0.0 standard drinks   Drug use: No   Sexual activity: Not on file  Other Topics Concern   Not on file  Social History Narrative   Work: recently retired Firefighter, 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      Home  Situation:  Lives alone, no family around, good friends      Spiritual Beliefs: christian, very involved with her church - [employer]      Lifestyle: exercising 5 days per week, trying to eat healthy               Social Determinants of Health   Financial Resource Strain: Not on file  Food Insecurity: Not on file  Transportation Needs: Not on file  Physical Activity: Not on file  Stress: Not on file  Social Connections: Not on file    Tobacco Counseling Counseling given: Not Answered   Clinical Intake:                 Diabetic?***         Activities of Daily Living No flowsheet data found.  Patient Care Team: Caren Macadam, MD as PCP - General (Family Medicine) Lorretta Harp, MD as PCP - Cardiology (Cardiology) Ardis Hughs, MD as Attending Physician (Urology) Armbruster, Carlota Raspberry, MD as Consulting Physician (Gastroenterology) Altheimer, Legrand Como, MD as Referring Physician (Endocrinology) [name], Eustace Quail, DO as Consulting Physician (Neurology)  Indicate any recent Medical Services you may have received from other than [facility] providers in the past year (date may be approximate).     Assessment:   This is a routine wellness examination for Rainee.  Hearing/Vision screen No results found.  Dietary issues and exercise activities discussed:     Goals Addressed   None    Depression Screen PHQ 2/9 Scores 06/30/2020  08/20/2019 05/08/2018 05/04/2017 04/27/2017 03/22/2016 04/02/2015  PHQ - 2 Score 0 1 0 0 0 0 1    Fall Risk Fall Risk  05/04/2021 11/20/2020 06/30/2020 05/19/2020 12/10/2019  Falls in the past year? 1 1 0 0 0  Number falls in past yr: 0 0 0 0 -  Injury with Fall? 1 0 0 0 -  Follow up - - Falls prevention discussed - -    FALL RISK PREVENTION PERTAINING TO THE HOME:  Any stairs in or around the home? {YES/NO:21197} If so, are there any without handrails? {YES/NO:21197} Home free of loose throw rugs in walkways, pet beds, electrical cords, etc? {YES/NO:21197} Adequate lighting in your home to reduce risk of falls? {YES/NO:21197}  ASSISTIVE DEVICES UTILIZED TO PREVENT FALLS:  Life alert? {YES/NO:21197} Use of a cane, walker or w/c? {YES/NO:21197} Grab bars in the bathroom? {YES/NO:21197} Shower chair or bench in shower? {YES/NO:21197} Elevated toilet seat or a handicapped toilet? {YES/NO:21197}  TIMED UP AND GO:  Was the test performed? {YES/NO:21197}.  Length of time to ambulate 10 feet: *** sec.   {Appearance of QQVZ:5638756}  Cognitive Function: MMSE - Mini Mental State Exam 05/08/2018  Not completed: (No Data)     6CIT Screen 06/30/2020 05/04/2017  What Year? 0 points 0 points  What month? 0 points 0 points  What time? - 0 points  Count back from 20 0 points 0 points  Months in reverse 0 points 0 points  Repeat phrase 2 points 0 points  Total Score - 0    Immunizations Immunization History  Administered Date(s) Administered   Influenza Whole 07/03/2012   Influenza, High Dose Seasonal PF 08/01/2017, 08/23/2018, 06/21/2019   Influenza-Unspecified 07/04/2015, 07/04/2016, 07/03/2017, 08/21/2018, 06/21/2019   PFIZER Comirnaty(Gray Top)Covid-19 Tri-Sucrose Vaccine 10/23/2019, 11/11/2019   PFIZER(Purple Top)SARS-COV-2 Vaccination 10/23/2019, 11/11/2019   Pneumococcal Conjugate-13 06/17/2014   Pneumococcal Polysaccharide-23 10/03/2009, 02/01/2011   Tdap 03/03/2009, 03/12/2016    Zoster, Live 01/31/2010    {  TDAP status:2101805}  {Flu Vaccine status:2101806}  {Pneumococcal vaccine status:2101807}  {Covid-19 vaccine status:2101808}  Qualifies for Shingles Vaccine? {YES/NO:21197}  Zostavax completed {YES/NO:21197}  {Shingrix Completed?:2101804}  Screening Tests Health Maintenance  Topic Date Due   Zoster Vaccines- Shingrix (1 of 2) Never done   HEMOGLOBIN A1C  08/23/2019   COVID-19 Vaccine (4 - Booster) 10/24/2020   FOOT EXAM  11/24/2020   INFLUENZA VACCINE  05/03/2021   COLONOSCOPY (Pts 45-101yrs Insurance coverage will need to be confirmed)  07/28/2021   OPHTHALMOLOGY EXAM  09/10/2021   TETANUS/TDAP  03/12/2026   DEXA SCAN  Completed   Hepatitis C Screening  Completed   HPV VACCINES  Aged Out    Health Maintenance  Health Maintenance Due  Topic Date Due   Zoster Vaccines- Shingrix (1 of 2) Never done   HEMOGLOBIN A1C  08/23/2019   COVID-19 Vaccine (4 - Booster) 10/24/2020   FOOT EXAM  11/24/2020   INFLUENZA VACCINE  05/03/2021    {Colorectal cancer screening:2101809}  {Mammogram status:21018020}  {Bone Density status:21018021}  Lung Cancer Screening: (Low Dose CT Chest recommended if Age 78-80 years, 30 pack-year currently smoking OR have quit w/in 15years.) {DOES NOT does:27190::"does not"} qualify.   Lung Cancer Screening Referral: ***  Additional Screening:  Hepatitis C Screening: {DOES NOT does:27190::"does not"} qualify; Completed ***  Vision Screening: Recommended annual ophthalmology exams for early detection of glaucoma and other disorders of the eye. Is the patient up to date with their annual eye exam?  {YES/NO:21197} Who is the provider or what is the name of the office in which the patient attends annual eye exams? *** If pt is not established with a provider, would they like to be referred to a provider to establish care? {YES/NO:21197}.   Dental Screening: Recommended annual dental exams for proper oral  hygiene  Community Resource Referral / Chronic Care Management: CRR required this visit?  {YES/NO:21197}  CCM required this visit?  {YES/NO:21197}     Plan:     I have personally reviewed and noted the following in the patient's chart:   Medical and social history Use of alcohol, tobacco or illicit drugs  Current medications and supplements including opioid prescriptions.  Functional ability and status Nutritional status Physical activity Advanced directives List of other physicians Hospitalizations, surgeries, and ER visits in previous 12 months Vitals Screenings to include cognitive, depression, and falls Referrals and appointments  In addition, I have reviewed and discussed with patient certain preventive protocols, quality metrics, and best practice recommendations. A written personalized care plan for preventive services as well as general preventive health recommendations were provided to patient.     Willette Brace, LPN   07/03/7509   Nurse Notes: ***

## 2021-08-13 ENCOUNTER — Other Ambulatory Visit: Payer: Self-pay

## 2021-08-13 ENCOUNTER — Encounter: Payer: Self-pay | Admitting: Cardiovascular Disease

## 2021-08-13 ENCOUNTER — Ambulatory Visit: Payer: Medicare PPO | Admitting: Cardiovascular Disease

## 2021-08-13 DIAGNOSIS — E1159 Type 2 diabetes mellitus with other circulatory complications: Secondary | ICD-10-CM | POA: Diagnosis not present

## 2021-08-13 DIAGNOSIS — E785 Hyperlipidemia, unspecified: Secondary | ICD-10-CM | POA: Diagnosis not present

## 2021-08-13 DIAGNOSIS — I152 Hypertension secondary to endocrine disorders: Secondary | ICD-10-CM | POA: Diagnosis not present

## 2021-08-13 DIAGNOSIS — E1169 Type 2 diabetes mellitus with other specified complication: Secondary | ICD-10-CM | POA: Diagnosis not present

## 2021-08-13 NOTE — Assessment & Plan Note (Signed)
History of essential hypertension a blood pressure measured today at 118/60.  She is on hydrochlorothiazide and losartan.

## 2021-08-13 NOTE — Assessment & Plan Note (Signed)
History of hyperlipidemia on statin therapy with lipid profile performed 08/11/2020 revealing total cholesterol 152, LDL 70 and HDL of 72.

## 2021-08-13 NOTE — Patient Instructions (Signed)

## 2021-08-13 NOTE — Progress Notes (Signed)
08/13/2021 Pamela Patton   16-Dec-1945  [id number]  Primary Physician Caren Macadam, MD Primary Cardiologist: Lorretta Harp MD Lupe Carney, [place]  HPI:  Pamela Patton is a 75 y.o.  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 I last saw her in the office 08/11/2020. Her risk factor profile is remarkable for diabetes, hypertension and hyperlipidemia. She had a Myoview 04/29/2010 which showed breast attenuation artifact. She has developed Parkinson's disease in the interim.   Since I saw her a year ago she continues to do well.  She exercise by herself outside without complaints of chest pain or shortness of breath.  She also exercises on a stationary bike at home daily for an hour.  Her Parkinson's disease is stable.     Current Meds  Medication Sig   aspirin 81 MG tablet Take 81 mg by mouth daily with lunch.    atorvastatin (LIPITOR) 40 MG tablet Take 40 mg by mouth daily with lunch.    Blood Pressure Monitoring (BLOOD PRESSURE CUFF) MISC 1 Product by Does not apply route as needed.   Calcium Carbonate-Vitamin D 600-400 MG-UNIT tablet Take 1 tablet by mouth 3 (three) times a week.    carbidopa-levodopa (SINEMET IR) 25-100 MG tablet Take 2 tablets by mouth 3 (three) times daily. 8am/noon/4pm.   Cholecalciferol (VITAMIN D PO) Take 5,000 Units by mouth 3 (three) times a week.    Cyanocobalamin (VITAMIN B12 PO) Take 1 tablet by mouth daily.   glucose 4 GM chewable tablet Chew 1 tablet by mouth as needed for low blood sugar.   glucose blood (ACCU-CHEK AVIVA PLUS) test strip Use to test blood sugar 4 times a day (Dx: E11.65)   HUMALOG KWIKPEN 100 UNIT/ML KiwkPen Inject 0-4 Units into the skin 3 (three) times daily with meals. Sliding scale per Dr Altheimer's office notes from 09/24/2019   hydrochlorothiazide (HYDRODIURIL) 25 MG tablet Take 25 mg by mouth daily with lunch.    insulin  glargine (LANTUS) 100 UNIT/ML injection Inject 15 Units into the skin every evening.    Insulin Syringe-Needle U-100 (INSULIN SYRINGE .3CC/31GX5/16") 31G X 5/16" 0.3 ML MISC Use as directed for Lantus injection once a day   levothyroxine (SYNTHROID, LEVOTHROID) 75 MCG tablet Take 75 mcg by mouth daily.   losartan (COZAAR) 25 MG tablet TAKE 2 TABLETS BY MOUTH EVERY DAY   mirtazapine (REMERON) 15 MG tablet Take 1 tablet (15 mg total) by mouth at bedtime.   NONFORMULARY OR COMPOUNDED ITEM [facility]:  Antifungal - Terbinafine 3%, Fluconazole 2%, Tea Tree Oil 5%, Urea 10%, Ibuprofen 2% in DMSO Suspension #70ml. Apply to the affected nail(s) once (at bedtime) or twice daily.   Nutritional Supplements (FEEDING SUPPLEMENT, GLUCERNA 1.2 CAL,) LIQD Place 237 mLs into feeding tube daily with breakfast.   OneTouch Delica Lancets 25O MISC Use to test blood sugar 4 times a day (Dx: E11.65)   Pioglitazone HCl (ACTOS PO) Take 45 mg by mouth daily with lunch.    Polyethyl Glycol-Propyl Glycol (SYSTANE) 0.4-0.3 % GEL ophthalmic gel Place 1 application into both eyes at bedtime as needed (FOR EYES).   Current Facility-Administered Medications for the 08/13/21 encounter (Office Visit) with Lorretta Harp, MD  Medication   0.9 %  sodium chloride infusion     No Known Allergies  Social History   Socioeconomic History   Marital status: Single  Spouse name: Not on file   Number of children: 1   Years of education: 65   Highest education level: Professional school degree (e.g., MD, DDS, DVM, JD)  Occupational History   Occupation: retired    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[employer], histology, anatomy and physiology  Tobacco Use   Smoking status: Never   Smokeless tobacco: Never  Vaping Use   Vaping Use: Never used  Substance and Sexual Activity   Alcohol use: No    Alcohol/week: 0.0 standard drinks   Drug use: No   Sexual activity: Not on file  Other Topics Concern   Not on file  Social History  Narrative   Work: recently retired Firefighter, 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  Cardiovascular: negative for chest pain, dyspnea on exertion, edema, orthopnea, palpitations, paroxysmal nocturnal dyspnea or shortness of breath Dermatological: negative for rash Respiratory: negative for cough or wheezing Urologic: negative for hematuria Abdominal: negative for nausea, vomiting, diarrhea, bright red blood per rectum, melena, or hematemesis Neurologic: negative for visual changes, syncope, or dizziness All other systems reviewed and are otherwise negative except as noted above.    Blood pressure 118/60, pulse 64, height 5\' 6"  (1.676 m), weight 131 lb 9.6 oz (59.7 kg), SpO2 99 %.  General appearance: alert and no distress Neck: no adenopathy, no carotid bruit, no JVD, supple, symmetrical, trachea midline, and thyroid not enlarged, symmetric, no tenderness/mass/nodules Lungs: clear to auscultation bilaterally Heart: regular rate and rhythm, S1, S2 normal, no murmur, click, rub or gallop Extremities: extremities normal, atraumatic, no cyanosis or edema Pulses: 2+ and symmetric Skin: Skin color, texture, turgor normal. No rashes or lesions Neurologic: Grossly normal  EKG sinus rhythm at 64 without ST or T wave changes.  I personally reviewed this EKG.  ASSESSMENT  AND PLAN:   Hypertension associated with diabetes ([place]) History of essential hypertension a blood pressure measured today at 118/60.  She is on hydrochlorothiazide and losartan.  Hyperlipidemia associated with type 2 diabetes mellitus ([place]) History of hyperlipidemia on statin therapy with lipid profile performed 08/11/2020 revealing total cholesterol 152, LDL 70 and HDL of 72.     Lorretta Harp MD FACP,FACC,FAHA, [facility] 08/13/2021 9:31 AM

## 2021-09-16 ENCOUNTER — Other Ambulatory Visit: Payer: Self-pay | Admitting: Family Medicine

## 2021-09-29 DIAGNOSIS — F324 Major depressive disorder, single episode, in partial remission: Secondary | ICD-10-CM | POA: Diagnosis not present

## 2021-09-29 DIAGNOSIS — E1165 Type 2 diabetes mellitus with hyperglycemia: Secondary | ICD-10-CM | POA: Diagnosis not present

## 2021-09-29 DIAGNOSIS — I10 Essential (primary) hypertension: Secondary | ICD-10-CM | POA: Diagnosis not present

## 2021-09-29 DIAGNOSIS — C649 Malignant neoplasm of unspecified kidney, except renal pelvis: Secondary | ICD-10-CM | POA: Diagnosis not present

## 2021-09-29 DIAGNOSIS — E785 Hyperlipidemia, unspecified: Secondary | ICD-10-CM | POA: Diagnosis not present

## 2021-09-29 DIAGNOSIS — E890 Postprocedural hypothyroidism: Secondary | ICD-10-CM | POA: Diagnosis not present

## 2021-09-29 DIAGNOSIS — K5900 Constipation, unspecified: Secondary | ICD-10-CM | POA: Diagnosis not present

## 2021-09-29 DIAGNOSIS — G20 Parkinson's disease: Secondary | ICD-10-CM | POA: Diagnosis not present

## 2021-09-29 DIAGNOSIS — Z794 Long term (current) use of insulin: Secondary | ICD-10-CM | POA: Diagnosis not present

## 2021-10-08 ENCOUNTER — Other Ambulatory Visit: Payer: Self-pay | Admitting: Family Medicine

## 2021-10-28 DIAGNOSIS — E11649 Type 2 diabetes mellitus with hypoglycemia without coma: Secondary | ICD-10-CM | POA: Diagnosis not present

## 2021-10-28 DIAGNOSIS — E890 Postprocedural hypothyroidism: Secondary | ICD-10-CM | POA: Diagnosis not present

## 2021-10-28 DIAGNOSIS — E785 Hyperlipidemia, unspecified: Secondary | ICD-10-CM | POA: Diagnosis not present

## 2021-10-28 DIAGNOSIS — E1165 Type 2 diabetes mellitus with hyperglycemia: Secondary | ICD-10-CM | POA: Diagnosis not present

## 2021-10-28 DIAGNOSIS — E1169 Type 2 diabetes mellitus with other specified complication: Secondary | ICD-10-CM | POA: Diagnosis not present

## 2021-10-28 DIAGNOSIS — Z794 Long term (current) use of insulin: Secondary | ICD-10-CM | POA: Diagnosis not present

## 2021-10-29 DIAGNOSIS — E890 Postprocedural hypothyroidism: Secondary | ICD-10-CM | POA: Diagnosis not present

## 2021-10-29 DIAGNOSIS — N1832 Chronic kidney disease, stage 3b: Secondary | ICD-10-CM | POA: Diagnosis not present

## 2021-10-29 DIAGNOSIS — E1169 Type 2 diabetes mellitus with other specified complication: Secondary | ICD-10-CM | POA: Diagnosis not present

## 2021-10-29 DIAGNOSIS — E1122 Type 2 diabetes mellitus with diabetic chronic kidney disease: Secondary | ICD-10-CM | POA: Diagnosis not present

## 2021-10-29 DIAGNOSIS — I129 Hypertensive chronic kidney disease with stage 1 through stage 4 chronic kidney disease, or unspecified chronic kidney disease: Secondary | ICD-10-CM | POA: Diagnosis not present

## 2021-10-29 DIAGNOSIS — Z794 Long term (current) use of insulin: Secondary | ICD-10-CM | POA: Diagnosis not present

## 2021-10-29 DIAGNOSIS — E785 Hyperlipidemia, unspecified: Secondary | ICD-10-CM | POA: Diagnosis not present

## 2021-10-29 DIAGNOSIS — E1165 Type 2 diabetes mellitus with hyperglycemia: Secondary | ICD-10-CM | POA: Diagnosis not present

## 2021-10-29 DIAGNOSIS — E113293 Type 2 diabetes mellitus with mild nonproliferative diabetic retinopathy without macular edema, bilateral: Secondary | ICD-10-CM | POA: Diagnosis not present

## 2021-11-01 ENCOUNTER — Ambulatory Visit (INDEPENDENT_AMBULATORY_CARE_PROVIDER_SITE_OTHER): Payer: Medicare PPO | Admitting: Family Medicine

## 2021-11-01 ENCOUNTER — Encounter: Payer: Self-pay | Admitting: Family Medicine

## 2021-11-01 VITALS — BP 136/78 | HR 86 | Temp 98.6°F | Ht 66.0 in | Wt 131.7 lb

## 2021-11-01 DIAGNOSIS — E1169 Type 2 diabetes mellitus with other specified complication: Secondary | ICD-10-CM

## 2021-11-01 DIAGNOSIS — E1129 Type 2 diabetes mellitus with other diabetic kidney complication: Secondary | ICD-10-CM | POA: Diagnosis not present

## 2021-11-01 DIAGNOSIS — G3184 Mild cognitive impairment, so stated: Secondary | ICD-10-CM

## 2021-11-01 DIAGNOSIS — I779 Disorder of arteries and arterioles, unspecified: Secondary | ICD-10-CM | POA: Diagnosis not present

## 2021-11-01 DIAGNOSIS — E039 Hypothyroidism, unspecified: Secondary | ICD-10-CM

## 2021-11-01 DIAGNOSIS — G20 Parkinson's disease: Secondary | ICD-10-CM | POA: Diagnosis not present

## 2021-11-01 DIAGNOSIS — E1159 Type 2 diabetes mellitus with other circulatory complications: Secondary | ICD-10-CM

## 2021-11-01 DIAGNOSIS — N183 Chronic kidney disease, stage 3 unspecified: Secondary | ICD-10-CM | POA: Diagnosis not present

## 2021-11-01 DIAGNOSIS — Z1231 Encounter for screening mammogram for malignant neoplasm of breast: Secondary | ICD-10-CM

## 2021-11-01 DIAGNOSIS — Z23 Encounter for immunization: Secondary | ICD-10-CM

## 2021-11-01 DIAGNOSIS — I152 Hypertension secondary to endocrine disorders: Secondary | ICD-10-CM

## 2021-11-01 DIAGNOSIS — Z0000 Encounter for general adult medical examination without abnormal findings: Secondary | ICD-10-CM | POA: Diagnosis not present

## 2021-11-01 DIAGNOSIS — E785 Hyperlipidemia, unspecified: Secondary | ICD-10-CM

## 2021-11-01 DIAGNOSIS — E559 Vitamin D deficiency, unspecified: Secondary | ICD-10-CM | POA: Diagnosis not present

## 2021-11-01 DIAGNOSIS — Z794 Long term (current) use of insulin: Secondary | ICD-10-CM

## 2021-11-01 DIAGNOSIS — G20A1 Parkinson's disease without dyskinesia, without mention of fluctuations: Secondary | ICD-10-CM

## 2021-11-01 MED ORDER — INSULIN GLARGINE 100 UNIT/ML ~~LOC~~ SOLN: 5.0000 [IU] | Freq: Every evening | SUBCUTANEOUS | Status: AC

## 2021-11-01 NOTE — Patient Instructions (Addendum)
If insulin regimen is too complicated and the change of lantus is not helping your numbers as much as we would like; talk with your endocrinologist about other options. There may be a longer acting/easier insulin regimen that they can put you on. There also may be some other categories of medications that are easier to take which would help with simplifying regimen (like GLP-1 which can be a once weekly injection) which may help with control overall.     [facility] [address] [place], [place]  [address] Phone:  [phone]  Call Dr. Havery Moros to set up follow up colonoscopy.    Shingrix at pharmacy

## 2021-11-01 NOTE — Progress Notes (Signed)
Pamela Patton DOB: [birth year] Encounter date: 11/01/2021  This is a 76 y.o. female who presents for complete physical   History of present illness/Additional concerns: Last visit with me was 11/25/2019.  Staying tired. Once she gets up, out of bed, gets ready then she gets a little bit of energy. Sometimes will use pedal machine which helps pick her up a little. Will do this for 30-60 minutes daily. Energy usually then feels good for the day. Didn't sleep well last night; thinking about doc appointment and has been up since 4am. Taking frequent naps- 1 or 2 short naps in a day. Usually wakes up early (6-6:30am); then busy trying just to manage all meds/meals. Is able to do some house cleaning.  Parkinson's: Follows with Dr. Carles Collet.  Carbidopa levodopa 25/102 tablets 3 times daily.  For depression/insomnia Remeron has been given but not taken by her.  She did have neurocognitive testing done with Dr.Merz in June 2021.  Hallucinations/delusions resolved on their own.  Hypertension: She does follow regularly with cardiology.  Last visit with cardiology was 08/2021.  Blood pressure stable on hydrochlorothiazide and losartan. She does check occasionally at home and usually runs well.  Hypothyroid/DMII: Follows with endocrinology through [facility], Dr. [place]ville Silos.  Lantus 5 units in the morning, Humalog 2 units plus correction scale  Under 80 = 0 units  80 - 120 = 1 unit  121 - 150 = 2 units  151 - 200 = 3 units  201 - 250 = 4 units  251 - 300 = 5 units  over 300 = 6 units   Actos 45 mg daily.  Synthroid 75 mcg 6 days a week. She was missing the lantus dosing sometimes in evening,so this was moved to morning.  Chronic kidney disease/renal mass: Plan at last visit was to observe and follow-up with imaging 06/2020. She had MRI 04/27/21. Right now still in holding pattern.   Hyperlipidemia:lipitor 40mg daily.   Mammogram: she just left it out of things to do.  Colonoscopy: Recall follow-up letter was  sent 05/2017.  Last colonoscopy was 07/2016.   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   Carotid artery disease 03/21/2017   Cataract    immature    Chest pain summer 2012   Chicken pox    CKD (chronic kidney disease) 10/08/2012   Stage 3, GFR 30-59 ml/min   Diverticulosis    difficulty with colonoscopy 2005   DM (diabetes mellitus), type 2 with renal complications 33/00/7622   Fracture    radial/ulnar 03/2016   GERD (gastroesophageal reflux disease)    Heart murmur    innocent murmur   High cholesterol    Hypertension associated with diabetes 02/22/2017   Hyperuricemia 10/31/2016   Hypothyroidism    s/p radioactive iodine tx for graves in 2001   Intestinal metaplasia of gastric mucosa    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   Parkinson's disease 08/08/2016   UTI (urinary tract infection)    Vitamin D deficiency 08/24/2012   Weight loss    eval with GI in 2016; s/p CT/MRI and PET scans   Past Surgical History:  Procedure Laterality Date   ABDOMINAL HYSTERECTOMY  1989   CATARACT EXTRACTION, BILATERAL     COLONOSCOPY     DG BARIUM SWALLOW ([place] HX)     9 yrs ago per pt due to incomplete colonsocopy   No Known Allergies Current Meds  Medication  Sig   aspirin 81 MG tablet Take 81 mg by mouth daily with lunch.    atorvastatin (LIPITOR) 40 MG tablet Take 40 mg by mouth daily with lunch.    Blood Pressure Monitoring (BLOOD PRESSURE CUFF) MISC 1 Product by Does not apply route as needed.   Calcium Carbonate-Vitamin D 600-400 MG-UNIT tablet Take 1 tablet by mouth 3 (three) times a week.    carbidopa-levodopa (SINEMET IR) 25-100 MG tablet Take 2 tablets by mouth 3 (three) times daily. 8am/noon/4pm.   Cholecalciferol (VITAMIN D PO) Take 5,000 Units by mouth 3 (three) times a week.    Cyanocobalamin (VITAMIN B12 PO) Take 1 tablet by mouth daily.   glucose 4 GM chewable tablet Chew 1 tablet by mouth as needed for low  blood sugar.   glucose blood (ACCU-CHEK AVIVA PLUS) test strip Use to test blood sugar 4 times a day (Dx: E11.65)   HUMALOG KWIKPEN 100 UNIT/ML KiwkPen Inject 0-4 Units into the skin 3 (three) times daily with meals. Sliding scale per Dr Altheimer's office notes from 09/24/2019   hydrochlorothiazide (HYDRODIURIL) 25 MG tablet Take 25 mg by mouth daily with lunch.    Insulin Syringe-Needle U-100 (INSULIN SYRINGE .3CC/31GX5/16") 31G X 5/16" 0.3 ML MISC Use as directed for Lantus injection once a day   levothyroxine (SYNTHROID, LEVOTHROID) 75 MCG tablet Take 75 mcg by mouth daily.   losartan (COZAAR) 25 MG tablet TAKE 2 TABLETS BY MOUTH EVERY DAY   NONFORMULARY OR COMPOUNDED ITEM [facility]:  Antifungal - Terbinafine 3%, Fluconazole 2%, Tea Tree Oil 5%, Urea 10%, Ibuprofen 2% in DMSO Suspension #84ml. Apply to the affected nail(s) once (at bedtime) or twice daily.   Nutritional Supplements (FEEDING SUPPLEMENT, GLUCERNA 1.2 CAL,) LIQD Place 237 mLs into feeding tube daily with breakfast.   OneTouch Delica Lancets 78G MISC Use to test blood sugar 4 times a day (Dx: E11.65)   Pioglitazone HCl (ACTOS PO) Take 45 mg by mouth daily with lunch.    Polyethyl Glycol-Propyl Glycol (SYSTANE) 0.4-0.3 % GEL ophthalmic gel Place 1 application into both eyes at bedtime as needed (FOR EYES).   [DISCONTINUED] insulin glargine (LANTUS) 100 UNIT/ML injection Inject 15 Units into the skin every evening.    [DISCONTINUED] mirtazapine (REMERON) 15 MG tablet Take 1 tablet (15 mg total) by mouth at bedtime.   Current Facility-Administered Medications for the 11/01/21 encounter (Office Visit) with Caren Macadam, MD  Medication   0.9 %  sodium chloride infusion   Social History   Tobacco Use   Smoking status: Never   Smokeless tobacco: Never  Substance Use Topics   Alcohol use: No    Alcohol/week: 0.0 standard drinks   Family History  Problem Relation Age of Onset   Cancer Mother        liver    Liver cancer Mother    Stroke Father 59   Diabetes Father    Hypertension Father    Diabetes Maternal Grandmother    Prostate cancer Maternal Grandfather    Pancreatic cancer Brother    Diabetes Sister    Cancer Sister    Diabetes Sister    Colon polyps Neg Hx    Colon cancer Neg Hx    Esophageal cancer Neg Hx    Rectal cancer Neg Hx    Stomach cancer Neg Hx      Review of Systems  Constitutional:  Positive for fatigue. Negative for activity change, appetite change, chills, fever and unexpected weight change.  HENT:  Negative for congestion, ear pain, hearing loss, sinus pressure, sinus pain, sore throat and trouble swallowing.   Eyes:  Negative for pain and visual disturbance.  Respiratory:  Negative for cough, chest tightness, shortness of breath and wheezing.   Cardiovascular:  Negative for chest pain, palpitations and leg swelling.  Gastrointestinal:  Negative for abdominal pain, blood in stool, constipation, diarrhea, nausea and vomiting.  Genitourinary:  Negative for difficulty urinating and menstrual problem.  Musculoskeletal:  Negative for arthralgias and back pain.  Skin:  Negative for rash.  Neurological:  Negative for dizziness, weakness, numbness and headaches.  Hematological:  Negative for adenopathy. Does not bruise/bleed easily.  Psychiatric/Behavioral:  Negative for sleep disturbance and suicidal ideas. The patient is not nervous/anxious.    CBC:  Lab Results  Component Value Date   WBC 9.5 08/14/2020   HGB 9.9 (L) 08/14/2020   HCT 32.2 (L) 08/14/2020   MCH 29.5 08/14/2020   MCHC 30.7 08/14/2020   RDW 14.6 08/14/2020   PLT 272 08/14/2020   CMP: Lab Results  Component Value Date   NA 142 08/14/2020   NA 142 04/27/2017   K 4.3 08/14/2020   CL 109 08/14/2020   CO2 23 08/14/2020   ANIONGAP 10 08/14/2020   GLUCOSE 299 (H) 08/14/2020   BUN 31 (H) 08/14/2020   BUN 23 (A) 04/27/2017   CREATININE 1.53 (H) 08/14/2020   GFRAA 43 (L) 11/28/2017   CALCIUM  9.4 08/14/2020   PROT 6.9 08/11/2020   BILITOT 0.5 08/11/2020   ALKPHOS 73 08/11/2020   ALT 5 08/11/2020   AST 14 08/11/2020   LIPID: Lab Results  Component Value Date   CHOL 152 08/11/2020   TRIG 47 08/11/2020   HDL 72 08/11/2020   LDLCALC 70 08/11/2020   LABVLDL 10 08/11/2020    Objective:  BP 136/78 (BP Location: Left Arm, Patient Position: Sitting, Cuff Size: Normal)    Pulse 86    Temp 98.6 F (37 C) (Oral)    Ht 5\' 6"  (1.676 m)    Wt 131 lb 11.2 oz (59.7 kg)    SpO2 95%    BMI 21.26 kg/m   Weight: 131 lb 11.2 oz (59.7 kg)   BP Readings from Last 3 Encounters:  11/01/21 136/78  08/13/21 118/60  05/04/21 110/66   Wt Readings from Last 3 Encounters:  11/01/21 131 lb 11.2 oz (59.7 kg)  08/13/21 131 lb 9.6 oz (59.7 kg)  05/04/21 132 lb (59.9 kg)    Physical Exam Constitutional:      General: She is not in acute distress.    Appearance: She is well-developed.  HENT:     Head: Normocephalic and atraumatic.     Right Ear: External ear normal.     Left Ear: External ear normal.     Mouth/Throat:     Pharynx: No oropharyngeal exudate.  Eyes:     Conjunctiva/sclera: Conjunctivae normal.     Pupils: Pupils are equal, round, and reactive to light.  Neck:     Thyroid: No thyromegaly.  Cardiovascular:     Rate and Rhythm: Normal rate and regular rhythm.     Heart sounds: Normal heart sounds. No murmur heard.   No friction rub. No gallop.  Pulmonary:     Effort: Pulmonary effort is normal.     Breath sounds: Normal breath sounds.  Abdominal:     General: Bowel sounds are normal. There is no distension.     Palpations: Abdomen is soft. There is no  mass.     Tenderness: There is no abdominal tenderness. There is no guarding.     Hernia: No hernia is present.  Musculoskeletal:        General: No tenderness or deformity. Normal range of motion.     Cervical back: Normal range of motion and neck supple.  Lymphadenopathy:     Cervical: No cervical adenopathy.   Skin:    General: Skin is warm and dry.     Findings: No rash.  Neurological:     Mental Status: She is alert and oriented to person, place, and time.     Deep Tendon Reflexes: Reflexes normal.     Reflex Scores:      Tricep reflexes are 2+ on the right side and 2+ on the left side.      Bicep reflexes are 2+ on the right side and 2+ on the left side.      Brachioradialis reflexes are 2+ on the right side and 2+ on the left side.      Patellar reflexes are 2+ on the right side and 2+ on the left side.    Comments: Movement, responses are generally slowed  Psychiatric:        Speech: Speech normal.        Behavior: Behavior normal.        Thought Content: Thought content normal.    Assessment/Plan: Health Maintenance Due  Topic Date Due   Zoster Vaccines- Shingrix (1 of 2) Never done   HEMOGLOBIN A1C  08/23/2019   FOOT EXAM  11/24/2020   COLONOSCOPY (Pts 45-34yrs Insurance coverage will need to be confirmed)  07/28/2021   Health Maintenance reviewed.  1. Preventative health care Encouraged her to keep up with regular activity.  She is behind on some of her preventative care items, but a lot of this is related to her living alone and issues with transportation/care.  See below. - Pneumococcal conjugate vaccine 20-valent (Prevnar 20)  2. Hypertension associated with diabetes ([place]) Blood pressure stable today.  We are reaching out for community care coordination to help her where possible to maintain overall health.  She has some interest in living in assisted living facility, but is not sure how to go about looking into this.  She understands that she may need more hands-on level of care in the future, and would like to prepare for this. - AMB Referral to [place]  3. Carotid artery disease, unspecified laterality, unspecified type [facility]) She is following regularly with cardiology.  Blood pressure is stable.  Continue with Lipitor. - AMB Referral to [place]  4. Hypothyroidism, unspecified type She does follow with endocrinology.  Continue with Synthroid 75 mcg daily. - AMB Referral to [place]  5. Type 2 diabetes mellitus with other diabetic kidney complication, with long-term current use of insulin [facility]) She follows with endocrinology.  We discussed medication burden for her today.  This is a real issue since she has multiple medications that require multiple dosings in the day.  She has a very difficult time with maintaining the schedule and trying to be compliant with how to take medications on the schedule.  I encouraged her to talk with her endocrinologist about this at her follow-up visit.  She has recently had a change to move her Lantus to the morning, which may help her with compliance and thus overall blood sugar control.  We discussed that there may be some other options for her diabetic  treatment that would be simpler for her to do and help with her compliance. - AMB Referral to [place]  6. Hyperlipidemia associated with type 2 diabetes mellitus ([place]) Continue with Lipitor. - AMB Referral to [place]  7. PD (Parkinson's disease) [facility]) She follows with neurology.  Currently on carbidopa levodopa. - AMB Referral to [place]  8. Stage 3 chronic kidney disease, unspecified whether stage 3a or 3b CKD ([place]) Kidney function has been relatively stable.  She is getting blood work done regularly through specialist. - AMB Referral to [place]  9. Vitamin D deficiency - AMB Referral to [place]  10. Mild neurocognitive disorder - AMB Referral to [place]  11. Encounter for screening mammogram for malignant neoplasm of breast - MM DIGITAL SCREENING BILATERAL; Future   I did give her number for low-power gastroenterology to discuss follow-up colonoscopy.  She has multiple years overdue, but we  additionally discussed that weighing our pros and cons and discussing with a specialist may be helpful for her to determine next step.  Similarly, we discussed mammogram.  She does feel that she would act upon abnormalities (i.e. would complete a surgery if she were found to have cancer in breast or colon).   Encouraged her to get Shingrix at the pharmacy.  Prevnar 20 was given today.   Return for pending lab or imaging results.  Micheline Rough, MD

## 2021-11-06 ENCOUNTER — Other Ambulatory Visit: Payer: Self-pay | Admitting: Family Medicine

## 2021-11-08 NOTE — Progress Notes (Deleted)
Assessment/Plan:   1.  Parkinsons Disease  -Continue carbidopa/levodopa 25/100, 2 tablets 3 times per day.  2.  RBD             -Doing fairly well in that regard.  Does not want any medication.   3.  Depression/insomnia             -mirtatazapine given but not taken but thinks that doing well in both aspects.   4.  MCI             -Had neurocognitive testing with Dr. Melvyn Novas in June, 2021.    Subjective:   Pamela Patton was seen today in follow up for Parkinsons disease.  My previous records were reviewed prior to todays visit as well as outside records available to me. Pt denies falls.  Pt denies lightheadedness, near syncope.  No hallucinations.  Mood has been good.  She saw her endocrinologist January 27.  She saw primary care on January 30.  Those notes are reviewed.  Current prescribed movement disorder medications: Carbidopa/levodopa 25/100, 2 tablets 3 times per day    PREVIOUS MEDICATIONS: mirtazapine (given but not taken)  ALLERGIES:  No Known Allergies  CURRENT MEDICATIONS:  Outpatient Encounter Medications as of 11/11/2021  Medication Sig   aspirin 81 MG tablet Take 81 mg by mouth daily with lunch.    atorvastatin (LIPITOR) 40 MG tablet Take 40 mg by mouth daily with lunch.    Blood Pressure Monitoring (BLOOD PRESSURE CUFF) MISC 1 Product by Does not apply route as needed.   Calcium Carbonate-Vitamin D 600-400 MG-UNIT tablet Take 1 tablet by mouth 3 (three) times a week.    carbidopa-levodopa (SINEMET IR) 25-100 MG tablet Take 2 tablets by mouth 3 (three) times daily. 8am/noon/4pm.   Cholecalciferol (VITAMIN D PO) Take 5,000 Units by mouth 3 (three) times a week.    Cyanocobalamin (VITAMIN B12 PO) Take 1 tablet by mouth daily.   glucose 4 GM chewable tablet Chew 1 tablet by mouth as needed for low blood sugar.   glucose blood (ACCU-CHEK AVIVA PLUS) test strip Use to test blood sugar 4 times a day (Dx: E11.65)   HUMALOG KWIKPEN 100 UNIT/ML KiwkPen Inject 0-4 Units  into the skin 3 (three) times daily with meals. Sliding scale per Dr Altheimer's office notes from 09/24/2019   hydrochlorothiazide (HYDRODIURIL) 25 MG tablet Take 25 mg by mouth daily with lunch.    insulin glargine (LANTUS) 100 UNIT/ML injection Inject 0.05 mLs (5 Units total) into the skin every evening.   Insulin Syringe-Needle U-100 (INSULIN SYRINGE .3CC/31GX5/16") 31G X 5/16" 0.3 ML MISC Use as directed for Lantus injection once a day   levothyroxine (SYNTHROID, LEVOTHROID) 75 MCG tablet Take 75 mcg by mouth daily.   losartan (COZAAR) 25 MG tablet TAKE 2 TABLETS BY MOUTH EVERY DAY   NONFORMULARY OR COMPOUNDED ITEM [facility]:  Antifungal - Terbinafine 3%, Fluconazole 2%, Tea Tree Oil 5%, Urea 10%, Ibuprofen 2% in DMSO Suspension #23ml. Apply to the affected nail(s) once (at bedtime) or twice daily.   Nutritional Supplements (FEEDING SUPPLEMENT, GLUCERNA 1.2 CAL,) LIQD Place 237 mLs into feeding tube daily with breakfast.   OneTouch Delica Lancets 69C MISC Use to test blood sugar 4 times a day (Dx: E11.65)   Pioglitazone HCl (ACTOS PO) Take 45 mg by mouth daily with lunch.    Polyethyl Glycol-Propyl Glycol (SYSTANE) 0.4-0.3 % GEL ophthalmic gel Place 1 application into both eyes at bedtime as needed (FOR EYES).  Facility-Administered Encounter Medications as of 11/11/2021  Medication   0.9 %  sodium chloride infusion    Objective:   PHYSICAL EXAMINATION:    VITALS:   There were no vitals filed for this visit.   GEN:  The patient appears stated age and is in NAD. HEENT:  Normocephalic, atraumatic.  The mucous membranes are moist. The superficial temporal arteries are without ropiness or tenderness. CV:  RRR Lungs:  CTAB Neck/HEME:  There are no carotid bruits bilaterally.  Neurological examination:  Orientation: The patient is alert and oriented x3. Cranial nerves: There is good facial symmetry with mild facial hypomimia. The speech is fluent and clear but pseudobulbar.  Soft palate rises symmetrically and there is no tongue deviation. Hearing is intact to conversational tone. Sensation: Sensation is intact to light touch throughout Motor: Strength is at least antigravity x4.  Movement examination: Tone: There is nl tone in the UE/LE Abnormal movements: none Coordination:  There is slowness with finger taps bilaterally but able to do all other RAM's without trouble.  Gait and Station: Patient pushes off of the chair to arise.  The patient's stride length is good.   I have reviewed and interpreted the following labs independently    Chemistry      Component Value Date/Time   NA 142 08/14/2020 2144   NA 142 04/27/2017 0000   K 4.3 08/14/2020 2144   CL 109 08/14/2020 2144   CO2 23 08/14/2020 2144   BUN 31 (H) 08/14/2020 2144   BUN 23 (A) 04/27/2017 0000   CREATININE 1.53 (H) 08/14/2020 2144   GLU 154 04/27/2017 0000      Component Value Date/Time   CALCIUM 9.4 08/14/2020 2144   ALKPHOS 73 08/11/2020 1116   AST 14 08/11/2020 1116   ALT 5 08/11/2020 1116   BILITOT 0.5 08/11/2020 1116       Lab Results  Component Value Date   WBC 9.5 08/14/2020   HGB 9.9 (L) 08/14/2020   HCT 32.2 (L) 08/14/2020   MCV 95.8 08/14/2020   PLT 272 08/14/2020    Lab Results  Component Value Date   TSH 0.30 (A) 04/27/2017     Total time spent on today's visit was *** minutes, including both face-to-face time and nonface-to-face time.  Time included that spent on review of records (prior notes available to me/labs/imaging if pertinent), discussing treatment and goals, answering patient's questions and coordinating care.  Cc:  Caren Macadam, MD

## 2021-11-10 ENCOUNTER — Telehealth: Payer: Self-pay | Admitting: *Deleted

## 2021-11-10 NOTE — Chronic Care Management (AMB) (Signed)
Chronic Care Management   Note  11/10/2021 Name: CYBILL URIEGAS MRN: [id number] DOB: 14-Oct-1945  [name] is a 76 y.o. year old female who is a primary care patient of Koberlein, Steele Berg, MD. I reached out to [name] by phone today in response to a referral sent by Ms. Alexander Bergeron [place]'s PCP.  Ms. Joens was given information about Chronic Care Management services today including:  CCM service includes personalized support from designated clinical staff supervised by her physician, including individualized plan of care and coordination with other care providers 24/7 contact phone numbers for assistance for urgent and routine care needs. Service will only be billed when office clinical staff spend 20 minutes or more in a month to coordinate care. Only one practitioner may furnish and bill the service in a calendar month. The patient may stop CCM services at any time (effective at the end of the month) by phone call to the office staff. The patient is responsible for co-pay (up to 20% after annual deductible is met) if co-pay is required by the individual health plan.   Patient agreed to services and verbal consent obtained.   Follow up plan: Telephone appointment with care management team member scheduled FHP:DFGI on 11/15/21 and SW on 11/18/21  [place] Management  Direct Dial: [phone]

## 2021-11-10 NOTE — Chronic Care Management (AMB) (Signed)
°  Chronic Care Management   Outreach Note  11/10/2021 Name: Pamela Patton MRN: [id number] DOB: May 26, 1946  Pamela Patton is a 76 y.o. year old female who is a primary care patient of Koberlein, Steele Berg, MD. I reached out to Pamela Patton by phone today in response to a referral sent by Pamela Patton's primary care provider.  An unsuccessful telephone outreach was attempted today. The patient was referred to the case management team for assistance with care management and care coordination.   Follow Up Plan: The care management team will reach out to the patient again over the next 7 days. If patient returns call to provider office, please advise to call [place] at [phone].  [place] Management  Direct Dial: [phone]

## 2021-11-11 ENCOUNTER — Ambulatory Visit: Payer: Medicare PPO | Admitting: Neurology

## 2021-11-15 ENCOUNTER — Ambulatory Visit (INDEPENDENT_AMBULATORY_CARE_PROVIDER_SITE_OTHER): Payer: Medicare PPO

## 2021-11-15 DIAGNOSIS — E1169 Type 2 diabetes mellitus with other specified complication: Secondary | ICD-10-CM

## 2021-11-15 DIAGNOSIS — E1159 Type 2 diabetes mellitus with other circulatory complications: Secondary | ICD-10-CM

## 2021-11-15 DIAGNOSIS — N183 Chronic kidney disease, stage 3 unspecified: Secondary | ICD-10-CM

## 2021-11-15 DIAGNOSIS — E785 Hyperlipidemia, unspecified: Secondary | ICD-10-CM

## 2021-11-15 DIAGNOSIS — I779 Disorder of arteries and arterioles, unspecified: Secondary | ICD-10-CM

## 2021-11-15 DIAGNOSIS — G20 Parkinson's disease: Secondary | ICD-10-CM

## 2021-11-15 DIAGNOSIS — Z794 Long term (current) use of insulin: Secondary | ICD-10-CM

## 2021-11-15 NOTE — Chronic Care Management (AMB) (Signed)
Chronic Care Management   CCM RN Visit Note  11/15/2021 Name: Pamela Patton MRN: [id number] DOB: Feb 20, 1946  Subjective: Pamela Patton is a 76 y.o. year old female who is a primary care patient of Koberlein, Steele Berg, MD. The care management team was consulted for assistance with disease management and care coordination needs.    Engaged with patient by telephone for initial visit in response to provider referral for case management and/or care coordination services.   Consent to Services:  The patient was given the following information about Chronic Care Management services today, agreed to services, and gave verbal consent: 1. CCM service includes personalized support from designated clinical staff supervised by the primary care provider, including individualized plan of care and coordination with other care providers 2. 24/7 contact phone numbers for assistance for urgent and routine care needs. 3. Service will only be billed when office clinical staff spend 20 minutes or more in a month to coordinate care. 4. Only one practitioner may furnish and bill the service in a calendar month. 5.The patient may stop CCM services at any time (effective at the end of the month) by phone call to the office staff. 6. The patient will be responsible for cost sharing (co-pay) of up to 20% of the service fee (after annual deductible is met). Patient agreed to services and consent obtained.  Patient agreed to services and verbal consent obtained.   Assessment: Review of patient past medical history, allergies, medications, health status, including review of consultants reports, laboratory and other test data, was performed as part of comprehensive evaluation and provision of chronic care management services.   SDOH (Social Determinants of Health) assessments and interventions performed:  SDOH Interventions    Flowsheet Row Most Recent Value  SDOH Interventions   Food Insecurity Interventions Intervention  Not Indicated  Financial Strain Interventions Intervention Not Indicated  Housing Interventions Intervention Not Indicated  Physical Activity Interventions Other (Comments)  [reviewed Silver [employer]ne[employer] benefit]  Transportation Interventions Other (Comment)  [insurance benefit for transportation]        CCM Care Plan  No Known Allergies  Outpatient Encounter Medications as of 11/15/2021  Medication Sig Note   aspirin 81 MG tablet Take 81 mg by mouth daily with lunch.     atorvastatin (LIPITOR) 40 MG tablet Take 40 mg by mouth daily with lunch.     Blood Pressure Monitoring (BLOOD PRESSURE CUFF) MISC 1 Product by Does not apply route as needed.    Calcium Carbonate-Vitamin D 600-400 MG-UNIT tablet Take 1 tablet by mouth 3 (three) times a week.     carbidopa-levodopa (SINEMET IR) 25-100 MG tablet Take 2 tablets by mouth 3 (three) times daily. 8am/noon/4pm.    Cholecalciferol (VITAMIN D PO) Take 5,000 Units by mouth 3 (three) times a week.     Cyanocobalamin (VITAMIN B12 PO) Take 1 tablet by mouth daily.    glucose 4 GM chewable tablet Chew 1 tablet by mouth as needed for low blood sugar. 06/30/2020: As needed    glucose blood (ACCU-CHEK AVIVA PLUS) test strip Use to test blood sugar 4 times a day (Dx: E11.65)    HUMALOG KWIKPEN 100 UNIT/ML KiwkPen Inject 0-4 Units into the skin 3 (three) times daily with meals. Sliding scale per Dr Altheimer's office notes from 09/24/2019    hydrochlorothiazide (HYDRODIURIL) 25 MG tablet Take 25 mg by mouth daily with lunch.     insulin glargine (LANTUS) 100 UNIT/ML injection Inject 0.05 mLs (5 Units total) into the  skin every evening. (Patient taking differently: Inject 5 Units into the skin daily.)    Insulin Syringe-Needle U-100 (INSULIN SYRINGE .3CC/31GX5/16") 31G X 5/16" 0.3 ML MISC Use as directed for Lantus injection once a day    levothyroxine (SYNTHROID, LEVOTHROID) 75 MCG tablet Take 75 mcg by mouth daily. 08/13/2021: Patient no taking 1 day at week  (Sunday)   losartan (COZAAR) 25 MG tablet TAKE 2 TABLETS BY MOUTH EVERY DAY    NONFORMULARY OR COMPOUNDED ITEM [facility]:  Antifungal - Terbinafine 3%, Fluconazole 2%, Tea Tree Oil 5%, Urea 10%, Ibuprofen 2% in DMSO Suspension #83m. Apply to the affected nail(s) once (at bedtime) or twice daily.    Nutritional Supplements (FEEDING SUPPLEMENT, GLUCERNA 1.2 CAL,) LIQD Take 237 mLs by mouth daily as needed. 06/30/2020: In place of meal when out at time   OSUPERVALU INCLancets 374BMISC Use to test blood sugar 4 times a day (Dx: E11.65)    Pioglitazone HCl (ACTOS PO) Take 45 mg by mouth daily with lunch.     Polyethyl Glycol-Propyl Glycol (SYSTANE) 0.4-0.3 % GEL ophthalmic gel Place 1 application into both eyes at bedtime as needed (FOR EYES).    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 ([facility] 03/21/2017   Hypertension associated with diabetes ([facility] 02/22/2017   Hyperlipidemia associated with type 2 diabetes mellitus ([facility] 02/22/2017   Hyperuricemia 10/31/2016   PD (Parkinson's disease) ([facility] 08/08/2016   Loss of weight 07/25/2016   DM (diabetes mellitus), type 2 with renal complications 044/96/7591  CKD (chronic kidney disease) stage 3, GFR 30-59 ml/min ([facility] 10/08/2012   Vitamin D deficiency 08/24/2012   Hypothyroid, s/p radioactive iodine tx for graves disease     Conditions to be addressed/monitored:CAD, HTN, HLD, DMII, CKD Stage 3, and Parkinson's Disease  Care Plan : RN Care Manager Plan of Care  Updates made by SDimitri Ped RN since 11/15/2021 12:00 AM     Problem: Chronic Disease Management and Care Coordination Needs (DM, Parkinsons Disease, CAD,CKD3, HTN ,HLD)   Priority: High     Long-Range Goal: Establish Plan of Care for Chronic  Disease Management Needs(DM, Parkinson's Disease, CAD,CKD3, HTN ,HLD)   Start Date: 11/15/2021  Expected End Date: 11/15/2022  Priority: High  Note:   Current Barriers:  Knowledge Deficits related to plan of care for management of CAD, HTN, HLD, DMII, CKD Stage 3, and Parkinson's Disease  Care Coordination needs related to Limited social support, Transportation, and ADL IADL limitations Chronic Disease Management support and education needs related to CAD, HTN, HLD, DMII, CKD Stage 3, and Parkinson's Disease   Lacks caregiver support Transportation barriers  States that her Parkinson's disease makes her slower.  States she spends a lot of time keeping up with her medications and insulin.  States she sometimes forgets to take her evening insulin as she falls asleep.  States her endocrinologist told her she could take her Lantus in the morning but she has not started doing that yet. States she checks her CBG 3-4 times a day and doses her insulin depending on her blood sugars.  States her CBGs in the morning range 121-130 and 140-200 later in the day.  States she lives in a 3 story town home and she would like to move to  somewhere that is better for her Parkinson's.  States she has a car that she can no longer drive as it is a straight shift.  States her church friends take her to her appointments and shopping. Denies any recent falls  RNCM Clinical Goal(s):  Patient will verbalize understanding of plan for management of CAD, HTN, HLD, DMII, CKD Stage 3, and Parkinson's Disease  as evidenced by voiced adherence to plan of care verbalize basic understanding of  CAD, HTN, HLD, DMII, CKD Stage 3, and Parkinson's Disease  disease process and self health management plan as evidenced by voiced understanding and teach back take all medications exactly as prescribed and will call provider for medication related questions as evidenced by dispense report and pt verbalization attend all scheduled medical  appointments: CCM LCSW 11/18/21 as evidenced by medical records demonstrate Improved adherence to prescribed treatment plan for CAD, HTN, HLD, DMII, CKD Stage 3, and Parkinson's Disease  as evidenced by readings within limits, voiced adherence to plan of care continue to work with RN Care Manager to address care management and care coordination needs related to  CAD, HTN, HLD, DMII, CKD Stage 3, and Parkinson's Disease  as evidenced by adherence to CM Team Scheduled appointments work with pharmacist to address polypharmacy and adherence issues related toCAD, HTN, HLD, DMII, CKD Stage 3, and Parkinson's Disease  as evidenced by review or EMR and patient or pharmacist report work with Education officer, museum to address  related to the management of Limited social support, Transportation, Level of care concerns, and ADL IADL limitations related to the management of CAD, HTN, HLD, DMII, CKD Stage 3, and Parkinson's Disease  as evidenced by review of EMR and patient or Education officer, museum report through collaboration with Consulting civil engineer, provider, and care team.   Interventions: 1:1 collaboration with primary care provider regarding development and update of comprehensive plan of care as evidenced by provider attestation and co-signature Inter-disciplinary care team collaboration (see longitudinal plan of care) Evaluation of current treatment plan related to  self management and patient's adherence to plan as established by provider   CAD Interventions: (Status:  New goal.) Long Term Goal Assessed understanding of CAD diagnosis Medications reviewed including medications utilized in CAD treatment plan Provided education on importance of blood pressure control in management of CAD Provided education on Importance of limiting foods high in cholesterol Counseled on importance of regular laboratory monitoring as prescribed Reviewed Importance of taking all medications as prescribed Reviewed Importance of attending all  scheduled provider appointments    Chronic Kidney Disease Interventions:  (Status:  New goal.) Long Term Goal Evaluation of current treatment plan related to chronic kidney disease self management and patient's adherence to plan as established by provider      Provided education to patient re: stroke prevention, s/s of heart attack and stroke    Discussed complications of poorly controlled blood pressure such as heart disease, stroke, circulatory complications, vision complications, kidney impairment, sexual dysfunction    Discussed the impact of chronic kidney disease on daily life and mental health and acknowledged and normalized feelings of disempowerment, fear, and frustration    Last practice recorded BP readings:  BP Readings from Last 3 Encounters:  11/01/21 136/78  08/13/21 118/60  05/04/21 110/66  Most recent eGFR/CrCl: No results found for: EGFR  No components found for: CRCL    Diabetes Interventions:  (Status:  New goal.) Long Term Goal Assessed patient's understanding of A1c goal: <7% Provided education to patient about basic DM  disease process Reviewed medications with patient and discussed importance of medication adherence Counseled on importance of regular laboratory monitoring as prescribed Provided patient with written educational materials related to hypo and hyperglycemia and importance of correct treatment Advised patient, providing education and rationale, to check cbg 3-4 times daily and record, calling provider for findings outside established parameters Referral made to pharmacy team for assistance with polypharmacy, complicated medication regime and adherence issues Referral made to social work team for assistance with scheduled with CCM LCSW 11/18/21 to address housing, level of care issues Review of patient status, including review of consultants reports, relevant laboratory and other test results, and medications completed Screening for signs and symptoms of  depression related to chronic disease state  Assessed social determinant of health barriers Reviewed importance of taking insulin and CBGs as ordered and to try taking her Lantus in the morning as endocrinologist has directed Lab Results  Component Value Date   HGBA1C 8.1 (H) 09/11/2018  Last Hemoglobin A1C at endocrinologist 7.3% 10/29/21  Parkinson's Disease   (Status:  New goal.)  Long Term Goal Evaluation of current treatment plan related to  Parkinson's Disease  ,  Parkinson's Disease   self-management and patient's adherence to plan as established by provider. Discussed plans with patient for ongoing care management follow up and provided patient with direct contact information for care management team Evaluation of current treatment plan related to Parkinson's Disease  and patient's adherence to plan as established by provider Provided education to patient re: Parkinson's Disease  Social Work referral for scheduled with CCM LCSW 11/18/21 to address housing, level of care issues Pharmacy referral for polypharmacy, complicated medication regime and adherence issues Screening for signs and symptoms of depression related to chronic disease state  Assessed social determinant of health barriers Reviewed home safety and fall precautions. Reviewed to contact her insurance company to check on benefit for personal emergency alarm   Hyperlipidemia Interventions:  (Status:  New goal.) Long Term Goal Medication review performed; medication list updated in electronic medical record.  Provider established cholesterol goals reviewed Counseled on importance of regular laboratory monitoring as prescribed Reviewed role and benefits of statin for ASCVD risk reduction  Hypertension Interventions:  (Status:  New goal.) Long Term Goal Last practice recorded BP readings:  BP Readings from Last 3 Encounters:  11/01/21 136/78  08/13/21 118/60  05/04/21 110/66  Most recent eGFR/CrCl: No results found for:  EGFR  No components found for: CRCL  Evaluation of current treatment plan related to hypertension self management and patient's adherence to plan as established by provider Provided education to patient re: stroke prevention, s/s of heart attack and stroke Reviewed medications with patient and discussed importance of compliance Counseled on the importance of exercise goals with target of 150 minutes per week Discussed plans with patient for ongoing care management follow up and provided patient with direct contact information for care management team Provided education on prescribed diet low sodium low CHO heart healthy Mailed [place]   calendar to pt    Patient Goals/Self-Care Activities: Take all medications as prescribed Attend all scheduled provider appointments Call pharmacy for medication refills 3-7 days in advance of running out of medications Attend church or other social activities Call provider office for new concerns or questions  Work with the social worker to address care coordination needs and will continue to work with the clinical team to address health care and disease management related needs keep appointment with eye doctor check blood sugar  at prescribed times: before meals and at bedtime and when you have symptoms of low or high blood sugar take the blood sugar log to all doctor visits drink 6 to 8 glasses of water each day fill half of plate with vegetables switch to sugar-free drinks Take Lantus insulin in the morning check blood pressure weekly choose a place to take my blood pressure (home, clinic or office, retail store) take blood pressure log to all doctor appointments keep all doctor appointments begin an exercise program eat more whole grains, fruits and vegetables, lean meats and healthy fats limit salt intake to 2321m/day call for medicine refill 2 or 3 days before it runs out take all medications exactly as prescribed call doctor  with any symptoms you believe are related to your medicine  Follow Up Plan:  Telephone follow up appointment with care management team member scheduled for:  12/06/21 The patient has been provided with contact information for the care management team and has been advised to call with any health related questions or concerns.       Plan:Telephone follow up appointment with care management team member scheduled for:  12/06/21 The patient has been provided with contact information for the care management team and has been advised to call with any health related questions or concerns.  MPeter GarterRN, BJackquline Denmark CDE Care Management Coordinator [facility] [phone]

## 2021-11-15 NOTE — Patient Instructions (Signed)
Visit Information   Thank you for taking time to visit with me today. Please don't hesitate to contact me if I can be of assistance to you before our next scheduled telephone appointment.  Following are the goals we discussed today:  Take all medications as prescribed Attend all scheduled provider appointments Call pharmacy for medication refills 3-7 days in advance of running out of medications Attend church or other social activities Call provider office for new concerns or questions  Work with the social worker to address care coordination needs and will continue to work with the clinical team to address health care and disease management related needs keep appointment with eye doctor check blood sugar at prescribed times: before meals and at bedtime and when you have symptoms of low or high blood sugar take the blood sugar log to all doctor visits drink 6 to 8 glasses of water each day fill half of plate with vegetables switch to sugar-free drinks Take Lantus insulin in the morning check blood pressure weekly choose a place to take my blood pressure (home, clinic or office, retail store) take blood pressure log to all doctor appointments keep all doctor appointments begin an exercise program eat more whole grains, fruits and vegetables, lean meats and healthy fats limit salt intake to 2372m/day call for medicine refill 2 or 3 days before it runs out take all medications exactly as prescribed call doctor with any symptoms you believe are related to your medicine  Our next appointment is by telephone on 12/06/21 at 10 AM  Please call the care guide team at [phone] you need to cancel or reschedule your appointment.   If you are experiencing a Mental Health or [place] need someone to talk to, please call the Suicide and Crisis Lifeline: 988 call the [place] Suicide Prevention Lifeline: 1581-636-4284or TTY: 19723317086TTY [phone]) 218-0345 to talk to  a trained counselor call 1-800-273-TALK (toll free, 24 hour hotline) go to [facility] [address] [place]([phone] call 9Newton GroveRN, [facility] CDE Care Management Coordinator [facility] ([phone] Following is a copy of your full care plan:  Care Plan : RCenterportof Care  Updates made by SDimitri Ped RN since 11/15/2021 12:00 AM     Problem: Chronic Disease Management and Care Coordination Needs (DM, Parkinsons Disease, CAD,CKD3, HTN ,HLD)   Priority: High     Long-Range Goal: Establish Plan of Care for Chronic Disease Management Needs(DM, Parkinson's Disease, CAD,CKD3, HTN ,HLD)   Start Date: 11/15/2021  Expected End Date: 11/15/2022  Priority: High  Note:   Current Barriers:  Knowledge Deficits related to plan of care for management of CAD, HTN, HLD, DMII, CKD Stage 3, and Parkinson's Disease  Care Coordination needs related to Limited social support, Transportation, and ADL IADL limitations Chronic Disease Management support and education needs related to CAD, HTN, HLD, DMII, CKD Stage 3, and Parkinson's Disease   Lacks caregiver support Transportation barriers  States that her Parkinson's disease makes her slower.  States she spends a lot of time keeping up with her medications and insulin.  States she sometimes forgets to take her evening insulin as she falls asleep.  States her endocrinologist told her she could take her Lantus in the morning but she has not started doing that yet. States she checks her CBG 3-4 times a day and doses her insulin depending on her blood sugars.  States her  CBGs in the morning range 121-130 and 140-200 later in the day.  States she lives in a 3 story town home and she would like to move to somewhere that is better for her Parkinson's.  States she has a car that she can no longer drive as it is a straight shift.  States her church friends take her to her  appointments and shopping. Denies any recent falls  RNCM Clinical Goal(s):  Patient will verbalize understanding of plan for management of CAD, HTN, HLD, DMII, CKD Stage 3, and Parkinson's Disease  as evidenced by voiced adherence to plan of care verbalize basic understanding of  CAD, HTN, HLD, DMII, CKD Stage 3, and Parkinson's Disease  disease process and self health management plan as evidenced by voiced understanding and teach back take all medications exactly as prescribed and will call provider for medication related questions as evidenced by dispense report and pt verbalization attend all scheduled medical appointments: CCM LCSW 11/18/21 as evidenced by medical records demonstrate Improved adherence to prescribed treatment plan for CAD, HTN, HLD, DMII, CKD Stage 3, and Parkinson's Disease  as evidenced by readings within limits, voiced adherence to plan of care continue to work with RN Care Manager to address care management and care coordination needs related to  CAD, HTN, HLD, DMII, CKD Stage 3, and Parkinson's Disease  as evidenced by adherence to CM Team Scheduled appointments work with pharmacist to address polypharmacy and adherence issues related toCAD, HTN, HLD, DMII, CKD Stage 3, and Parkinson's Disease  as evidenced by review or EMR and patient or pharmacist report work with Education officer, museum to address  related to the management of Limited social support, Transportation, Level of care concerns, and ADL IADL limitations related to the management of CAD, HTN, HLD, DMII, CKD Stage 3, and Parkinson's Disease  as evidenced by review of EMR and patient or Education officer, museum report through collaboration with Consulting civil engineer, provider, and care team.   Interventions: 1:1 collaboration with primary care provider regarding development and update of comprehensive plan of care as evidenced by provider attestation and co-signature Inter-disciplinary care team collaboration (see longitudinal plan of  care) Evaluation of current treatment plan related to  self management and patient's adherence to plan as established by provider   CAD Interventions: (Status:  New goal.) Long Term Goal Assessed understanding of CAD diagnosis Medications reviewed including medications utilized in CAD treatment plan Provided education on importance of blood pressure control in management of CAD Provided education on Importance of limiting foods high in cholesterol Counseled on importance of regular laboratory monitoring as prescribed Reviewed Importance of taking all medications as prescribed Reviewed Importance of attending all scheduled provider appointments    Chronic Kidney Disease Interventions:  (Status:  New goal.) Long Term Goal Evaluation of current treatment plan related to chronic kidney disease self management and patient's adherence to plan as established by provider      Provided education to patient re: stroke prevention, s/s of heart attack and stroke    Discussed complications of poorly controlled blood pressure such as heart disease, stroke, circulatory complications, vision complications, kidney impairment, sexual dysfunction    Discussed the impact of chronic kidney disease on daily life and mental health and acknowledged and normalized feelings of disempowerment, fear, and frustration    Last practice recorded BP readings:  BP Readings from Last 3 Encounters:  11/01/21 136/78  08/13/21 118/60  05/04/21 110/66  Most recent eGFR/CrCl: No results found for: EGFR  No components found  for: CRCL    Diabetes Interventions:  (Status:  New goal.) Long Term Goal Assessed patient's understanding of A1c goal: <7% Provided education to patient about basic DM disease process Reviewed medications with patient and discussed importance of medication adherence Counseled on importance of regular laboratory monitoring as prescribed Provided patient with written educational materials related to hypo  and hyperglycemia and importance of correct treatment Advised patient, providing education and rationale, to check cbg 3-4 times daily and record, calling provider for findings outside established parameters Referral made to pharmacy team for assistance with polypharmacy, complicated medication regime and adherence issues Referral made to social work team for assistance with scheduled with CCM LCSW 11/18/21 to address housing, level of care issues Review of patient status, including review of consultants reports, relevant laboratory and other test results, and medications completed Screening for signs and symptoms of depression related to chronic disease state  Assessed social determinant of health barriers Reviewed importance of taking insulin and CBGs as ordered and to try taking her Lantus in the morning as endocrinologist has directed Lab Results  Component Value Date   HGBA1C 8.1 (H) 09/11/2018  Last Hemoglobin A1C at endocrinologist 7.3% 10/29/21  Parkinson's Disease   (Status:  New goal.)  Long Term Goal Evaluation of current treatment plan related to  Parkinson's Disease  ,  Parkinson's Disease   self-management and patient's adherence to plan as established by provider. Discussed plans with patient for ongoing care management follow up and provided patient with direct contact information for care management team Evaluation of current treatment plan related to Parkinson's Disease  and patient's adherence to plan as established by provider Provided education to patient re: Parkinson's Disease  Social Work referral for scheduled with CCM LCSW 11/18/21 to address housing, level of care issues Pharmacy referral for polypharmacy, complicated medication regime and adherence issues Screening for signs and symptoms of depression related to chronic disease state  Assessed social determinant of health barriers Reviewed home safety and fall precautions. Reviewed to contact her insurance company to  check on benefit for personal emergency alarm   Hyperlipidemia Interventions:  (Status:  New goal.) Long Term Goal Medication review performed; medication list updated in electronic medical record.  Provider established cholesterol goals reviewed Counseled on importance of regular laboratory monitoring as prescribed Reviewed role and benefits of statin for ASCVD risk reduction  Hypertension Interventions:  (Status:  New goal.) Long Term Goal Last practice recorded BP readings:  BP Readings from Last 3 Encounters:  11/01/21 136/78  08/13/21 118/60  05/04/21 110/66  Most recent eGFR/CrCl: No results found for: EGFR  No components found for: CRCL  Evaluation of current treatment plan related to hypertension self management and patient's adherence to plan as established by provider Provided education to patient re: stroke prevention, s/s of heart attack and stroke Reviewed medications with patient and discussed importance of compliance Counseled on the importance of exercise goals with target of 150 minutes per week Discussed plans with patient for ongoing care management follow up and provided patient with direct contact information for care management team Provided education on prescribed diet low sodium low CHO heart healthy Mailed [place]   calendar to pt    Patient Goals/Self-Care Activities: Take all medications as prescribed Attend all scheduled provider appointments Call pharmacy for medication refills 3-7 days in advance of running out of medications Attend church or other social activities Call provider office for new concerns or questions  Work with the social worker to address  care coordination needs and will continue to work with the clinical team to address health care and disease management related needs keep appointment with eye doctor check blood sugar at prescribed times: before meals and at bedtime and when you have symptoms of low or high blood  sugar take the blood sugar log to all doctor visits drink 6 to 8 glasses of water each day fill half of plate with vegetables switch to sugar-free drinks Take Lantus insulin in the morning check blood pressure weekly choose a place to take my blood pressure (home, clinic or office, retail store) take blood pressure log to all doctor appointments keep all doctor appointments begin an exercise program eat more whole grains, fruits and vegetables, lean meats and healthy fats limit salt intake to 2359m/day call for medicine refill 2 or 3 days before it runs out take all medications exactly as prescribed call doctor with any symptoms you believe are related to your medicine  Follow Up Plan:  Telephone follow up appointment with care management team member scheduled for:  12/06/21 The patient has been provided with contact information for the care management team and has been advised to call with any health related questions or concerns.       Consent to CCM Services: Ms. FRayfieldwas given information about Chronic Care Management services including:  CCM service includes personalized support from designated clinical staff supervised by her physician, including individualized plan of care and coordination with other care providers 24/7 contact phone numbers for assistance for urgent and routine care needs. Service will only be billed when office clinical staff spend 20 minutes or more in a month to coordinate care. Only one practitioner may furnish and bill the service in a calendar month. The patient may stop CCM services at any time (effective at the end of the month) by phone call to the office staff. The patient will be responsible for cost sharing (co-pay) of up to 20% of the service fee (after annual deductible is met).  Patient agreed to services and verbal consent obtained.   The patient verbalized understanding of instructions, educational materials, and care plan provided today and  agreed to receive a mailed copy of patient instructions, educational materials, and care plan.   Telephone follow up appointment with care management team member scheduled for: 12/06/21 MPeter GarterRN, [facility] CDE Care Management Coordinator [facility] ([phone]

## 2021-11-17 ENCOUNTER — Other Ambulatory Visit: Payer: Self-pay | Admitting: Family Medicine

## 2021-11-17 DIAGNOSIS — I152 Hypertension secondary to endocrine disorders: Secondary | ICD-10-CM

## 2021-11-17 DIAGNOSIS — G20 Parkinson's disease: Secondary | ICD-10-CM

## 2021-11-17 DIAGNOSIS — E1169 Type 2 diabetes mellitus with other specified complication: Secondary | ICD-10-CM

## 2021-11-17 DIAGNOSIS — E1129 Type 2 diabetes mellitus with other diabetic kidney complication: Secondary | ICD-10-CM

## 2021-11-17 DIAGNOSIS — G20A1 Parkinson's disease without dyskinesia, without mention of fluctuations: Secondary | ICD-10-CM

## 2021-11-17 DIAGNOSIS — E039 Hypothyroidism, unspecified: Secondary | ICD-10-CM

## 2021-11-17 DIAGNOSIS — I779 Disorder of arteries and arterioles, unspecified: Secondary | ICD-10-CM

## 2021-11-17 DIAGNOSIS — Z794 Long term (current) use of insulin: Secondary | ICD-10-CM

## 2021-11-17 DIAGNOSIS — E1159 Type 2 diabetes mellitus with other circulatory complications: Secondary | ICD-10-CM

## 2021-11-18 ENCOUNTER — Ambulatory Visit: Payer: Medicare PPO | Admitting: Licensed Clinical Social Worker

## 2021-11-18 ENCOUNTER — Telehealth: Payer: Self-pay | Admitting: Family Medicine

## 2021-11-18 DIAGNOSIS — I779 Disorder of arteries and arterioles, unspecified: Secondary | ICD-10-CM

## 2021-11-18 DIAGNOSIS — E1129 Type 2 diabetes mellitus with other diabetic kidney complication: Secondary | ICD-10-CM

## 2021-11-18 DIAGNOSIS — E785 Hyperlipidemia, unspecified: Secondary | ICD-10-CM

## 2021-11-18 DIAGNOSIS — E1159 Type 2 diabetes mellitus with other circulatory complications: Secondary | ICD-10-CM

## 2021-11-18 DIAGNOSIS — N183 Chronic kidney disease, stage 3 unspecified: Secondary | ICD-10-CM

## 2021-11-18 DIAGNOSIS — G20 Parkinson's disease: Secondary | ICD-10-CM

## 2021-11-18 DIAGNOSIS — G3184 Mild cognitive impairment, so stated: Secondary | ICD-10-CM

## 2021-11-18 DIAGNOSIS — E1169 Type 2 diabetes mellitus with other specified complication: Secondary | ICD-10-CM

## 2021-11-18 NOTE — Chronic Care Management (AMB) (Signed)
°  Chronic Care Management   Note  11/18/2021 Name: SHAQUEENA MAUCERI MRN: [id number] DOB: September 18, 1946  [name] is a 76 y.o. year old female who is a primary care patient of Koberlein, Steele Berg, MD. I reached out to [name] by phone today in response to a referral sent by Ms. Lyrical Darnell Level Ledin's PCP, Caren Macadam, MD.   Ms. Teater was given information about Chronic Care Management services today including:  CCM service includes personalized support from designated clinical staff supervised by her physician, including individualized plan of care and coordination with other care providers 24/7 contact phone numbers for assistance for urgent and routine care needs. Service will only be billed when office clinical staff spend 20 minutes or more in a month to coordinate care. Only one practitioner may furnish and bill the service in a calendar month. The patient may stop CCM services at any time (effective at the end of the month) by phone call to the office staff.   Patient agreed to services and verbal consent obtained.   Follow up plan: PT AWARE $20 [place]

## 2021-11-26 NOTE — Patient Instructions (Signed)
Visit Information  Thank you for taking time to visit with me today. Please don't hesitate to contact me if I can be of assistance to you before our next scheduled telephone appointment.  Following are the goals we discussed today:  Patient Goals/Self-Care Activities: Over the next 120 days Attend all scheduled medical appointments Utilize healthy coping skills and/or supportive resources discussed Contact PCP office with any questions or concerns  Our next appointment is by telephone on 12/24/21 at 10:45 AM  Please call the care guide team at [phone] if you need to cancel or reschedule your appointment.   If you are experiencing a Mental Health or [place] or need someone to talk to, please call the [place] National Suicide Prevention Lifeline: 779-441-1874 or TTY: 714-566-7678 TTY 314-734-5100) to talk to a trained counselor call 911   Following is a copy of your full plan of care:  Care Plan : LCSW Plan of Care  Updates made by Rebekah [place]field, LCSW since 11/26/2021 12:00 AM     Problem: Quality of Life (General Plan of Care)      Long-Range Goal: Quality of Life Maintained   Start Date: 11/18/2021  This Visit's Progress: On track  Priority: Medium  Note:   Current barriers:   Acute Mental Health needs related to Stress Needs Support, Education, and Care Coordination in order to manage psychosocial stressors Clinical Goal(s): explore community resource options for unmet needs related to:  Stress  Clinical Interventions:  Assessed patient's previous and current treatment, coping skills, support system and barriers to care  Patient endorses difficulty adjusting with lifestyle changes after the pandemic. Patient was successful in identifying stressors and healthy coping skills to assist with management of stress Patient receives strong support from family and friends through church Mindfulness or Relaxation training provided Active listening / Reflection  utilized  Emotional Support Provided Problem [place] strategies reviewed Provided psychoeducation for mental health needs  Verbalization of feelings encouraged   1:1 collaboration with primary care provider regarding development and update of comprehensive plan of care as evidenced by provider attestation and co-signature Inter-disciplinary care team collaboration (see longitudinal plan of care) Patient Goals/Self-Care Activities: Over the next 120 days Attend all scheduled medical appointments Utilize healthy coping skills and/or supportive resources discussed Contact PCP office with any questions or concerns        Ms. Menchaca was given information about Care Management services by the embedded care coordination team including:  Care Management services include personalized support from designated clinical staff supervised by her physician, including individualized plan of care and coordination with other care providers 24/7 contact phone numbers for assistance for urgent and routine care needs. The patient may stop CCM services at any time (effective at the end of the month) by phone call to the office staff.  Patient agreed to services and verbal consent obtained.   Patient verbalizes understanding of instructions and care plan provided today and agrees to view in [place]. Active MyChart status confirmed with patient.    Christa See, MSW, [email or web address] Phone [phone] 6:53 AM

## 2021-11-26 NOTE — Chronic Care Management (AMB) (Signed)
Chronic Care Management    Clinical Social Work Note  11/26/2021 Name: Pamela Patton MRN: [id number] DOB: May 02, 1946  Pamela Patton is a 76 y.o. year old female who is a primary care patient of Koberlein, Steele Berg, MD. The CCM team was consulted to assist the patient with chronic disease management and/or care coordination needs related to:  Stress .   Engaged with patient by telephone for initial visit in response to provider referral for social work chronic care management and care coordination services.   Consent to Services:  The patient was given the following information about Chronic Care Management services today, agreed to services, and gave verbal consent: 1. CCM service includes personalized support from designated clinical staff supervised by the primary care provider, including individualized plan of care and coordination with other care providers 2. 24/7 contact phone numbers for assistance for urgent and routine care needs. 3. Service will only be billed when office clinical staff spend 20 minutes or more in a month to coordinate care. 4. Only one practitioner may furnish and bill the service in a calendar month. 5.The patient may stop CCM services at any time (effective at the end of the month) by phone call to the office staff. 6. The patient will be responsible for cost sharing (co-pay) of up to 20% of the service fee (after annual deductible is met). Patient agreed to services and consent obtained.  Patient agreed to services and consent obtained.   Assessment: Review of patient past medical history, allergies, medications, and health status, including review of relevant consultants reports was performed today as part of a comprehensive evaluation and provision of chronic care management and care coordination services.     SDOH (Social Determinants of Health) assessments and interventions performed:    Advanced Directives Status: Not addressed in this encounter.  CCM Care  Plan  No Known Allergies  Outpatient Encounter Medications as of 11/18/2021  Medication Sig Note   aspirin 81 MG tablet Take 81 mg by mouth daily with lunch.     atorvastatin (LIPITOR) 40 MG tablet Take 40 mg by mouth daily with lunch.     Blood Pressure Monitoring (BLOOD PRESSURE CUFF) MISC 1 Product by Does not apply route as needed.    Calcium Carbonate-Vitamin D 600-400 MG-UNIT tablet Take 1 tablet by mouth 3 (three) times a week.     carbidopa-levodopa (SINEMET IR) 25-100 MG tablet Take 2 tablets by mouth 3 (three) times daily. 8am/noon/4pm.    Cholecalciferol (VITAMIN D PO) Take 5,000 Units by mouth 3 (three) times a week.     Cyanocobalamin (VITAMIN B12 PO) Take 1 tablet by mouth daily.    glucose 4 GM chewable tablet Chew 1 tablet by mouth as needed for low blood sugar. 06/30/2020: As needed    glucose blood (ACCU-CHEK AVIVA PLUS) test strip Use to test blood sugar 4 times a day (Dx: E11.65)    HUMALOG KWIKPEN 100 UNIT/ML KiwkPen Inject 0-4 Units into the skin 3 (three) times daily with meals. Sliding scale per Dr Altheimer's office notes from 09/24/2019    hydrochlorothiazide (HYDRODIURIL) 25 MG tablet Take 25 mg by mouth daily with lunch.     insulin glargine (LANTUS) 100 UNIT/ML injection Inject 0.05 mLs (5 Units total) into the skin every evening. (Patient taking differently: Inject 5 Units into the skin daily.)    Insulin Syringe-Needle U-100 (INSULIN SYRINGE .3CC/31GX5/16") 31G X 5/16" 0.3 ML MISC Use as directed for Lantus injection once a day  levothyroxine (SYNTHROID, LEVOTHROID) 75 MCG tablet Take 75 mcg by mouth daily. 08/13/2021: Patient no taking 1 day at week (Sunday)   losartan (COZAAR) 25 MG tablet TAKE 2 TABLETS BY MOUTH EVERY DAY    NONFORMULARY OR COMPOUNDED ITEM [facility]:  Antifungal - Terbinafine 3%, Fluconazole 2%, Tea Tree Oil 5%, Urea 10%, Ibuprofen 2% in DMSO Suspension #52m. Apply to the affected nail(s) once (at bedtime) or twice daily.     Nutritional Supplements (FEEDING SUPPLEMENT, GLUCERNA 1.2 CAL,) LIQD Take 237 mLs by mouth daily as needed. 06/30/2020: In place of meal when out at time   OSUPERVALU INCLancets 342HMISC Use to test blood sugar 4 times a day (Dx: E11.65)    Pioglitazone HCl (ACTOS PO) Take 45 mg by mouth daily with lunch.     Polyethyl Glycol-Propyl Glycol (SYSTANE) 0.4-0.3 % GEL ophthalmic gel Place 1 application into both eyes at bedtime as needed (FOR EYES).    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   Carotid artery disease ([place] 03/21/2017   Hypertension associated with diabetes ([place] 02/22/2017   Hyperlipidemia associated with type 2 diabetes mellitus ([facility] 02/22/2017   Hyperuricemia 10/31/2016   PD (Parkinson's disease) ([place] 08/08/2016   Loss of weight 07/25/2016   DM (diabetes mellitus), type 2 with renal complications 006/23/7628  CKD (chronic kidney disease) stage 3, GFR 30-59 ml/min (HCC) 10/08/2012   Vitamin D deficiency 08/24/2012   Hypothyroid, s/p radioactive iodine tx for graves disease     Conditions to be addressed/monitored: HTN, DMII, and CKD Stage 3 ; Stress  Care Plan : LCSW Plan of Care  Updates made by LRebekah Chesterfield LCSW since 11/26/2021 12:00 AM     Problem: Quality of Life (General Plan of Care)      Long-Range Goal: Quality of Life Maintained   Start Date: 11/18/2021  This Visit's Progress: On track  Priority: Medium  Note:   Current barriers:   Acute Mental Health needs related to Stress Needs Support, Education, and Care Coordination in order to manage psychosocial stressors Clinical Goal(s): explore community resource options for unmet needs related to:  Stress  Clinical Interventions:  Assessed patient's previous and current treatment, coping skills,  support system and barriers to care  Patient endorses difficulty adjusting with lifestyle changes after the pandemic. Patient was successful in identifying stressors and healthy coping skills to assist with management of stress Patient receives strong support from family and friends through church Mindfulness or Relaxation training provided Active listening / Reflection utilized  Emotional Support Provided Problem SOsagestrategies reviewed Provided psychoeducation for mental health needs  Verbalization of feelings encouraged   1:1 collaboration with primary care provider regarding development and update of comprehensive plan of care as evidenced by provider attestation and co-signature Inter-disciplinary care team collaboration (see longitudinal plan of care) Patient Goals/Self-Care Activities: Over the next 120 days Attend all scheduled medical appointments Utilize healthy coping skills and/or supportive resources discussed Contact PCP office with any questions or concerns         JChrista See MSW, [email or web address] Phone ([phone] AM

## 2021-11-30 DIAGNOSIS — E1169 Type 2 diabetes mellitus with other specified complication: Secondary | ICD-10-CM

## 2021-11-30 DIAGNOSIS — N183 Chronic kidney disease, stage 3 unspecified: Secondary | ICD-10-CM | POA: Diagnosis not present

## 2021-11-30 DIAGNOSIS — I152 Hypertension secondary to endocrine disorders: Secondary | ICD-10-CM | POA: Diagnosis not present

## 2021-11-30 DIAGNOSIS — E785 Hyperlipidemia, unspecified: Secondary | ICD-10-CM | POA: Diagnosis not present

## 2021-11-30 DIAGNOSIS — E1159 Type 2 diabetes mellitus with other circulatory complications: Secondary | ICD-10-CM

## 2021-11-30 DIAGNOSIS — Z794 Long term (current) use of insulin: Secondary | ICD-10-CM

## 2021-11-30 DIAGNOSIS — E1129 Type 2 diabetes mellitus with other diabetic kidney complication: Secondary | ICD-10-CM

## 2021-12-06 ENCOUNTER — Ambulatory Visit (INDEPENDENT_AMBULATORY_CARE_PROVIDER_SITE_OTHER): Payer: Medicare PPO

## 2021-12-06 DIAGNOSIS — Z794 Long term (current) use of insulin: Secondary | ICD-10-CM

## 2021-12-06 DIAGNOSIS — G20A1 Parkinson's disease without dyskinesia, without mention of fluctuations: Secondary | ICD-10-CM

## 2021-12-06 DIAGNOSIS — E1159 Type 2 diabetes mellitus with other circulatory complications: Secondary | ICD-10-CM

## 2021-12-06 DIAGNOSIS — G20 Parkinson's disease: Secondary | ICD-10-CM

## 2021-12-06 DIAGNOSIS — E785 Hyperlipidemia, unspecified: Secondary | ICD-10-CM

## 2021-12-06 DIAGNOSIS — I152 Hypertension secondary to endocrine disorders: Secondary | ICD-10-CM

## 2021-12-06 DIAGNOSIS — E1129 Type 2 diabetes mellitus with other diabetic kidney complication: Secondary | ICD-10-CM

## 2021-12-06 DIAGNOSIS — E1169 Type 2 diabetes mellitus with other specified complication: Secondary | ICD-10-CM

## 2021-12-06 DIAGNOSIS — N183 Chronic kidney disease, stage 3 unspecified: Secondary | ICD-10-CM

## 2021-12-06 NOTE — Patient Instructions (Signed)
Visit Information ? ?Thank you for taking time to visit with me today. Please don't hesitate to contact me if I can be of assistance to you before our next scheduled telephone appointment. ? ?Following are the goals we discussed today:  ?Take all medications as prescribed ?Attend all scheduled provider appointments ?Call pharmacy for medication refills 3-7 days in advance of running out of medications ?Attend church or other social activities ?Call provider office for new concerns or questions  ?Work with the Education officer, museum to address care coordination needs and will continue to work with the clinical team to address health care and disease management related needs ?keep appointment with eye doctor ?check blood sugar at prescribed times: before meals and at bedtime and when you have symptoms of low or high blood sugar ?take the blood sugar log to all doctor visits ?drink 6 to 8 glasses of water each day ?fill half of plate with vegetables ?switch to sugar-free drinks ?Take Lantus insulin in the morning ?check blood pressure weekly ?choose a place to take my blood pressure (home, clinic or office, retail store) ?take blood pressure log to all doctor appointments ?keep all doctor appointments ?begin an exercise program ?eat more whole grains, fruits and vegetables, lean meats and healthy fats ?limit salt intake to '2300mg'$ /day ?call for medicine refill 2 or 3 days before it runs out ?take all medications exactly as prescribed ?call doctor with any symptoms you believe are related to your medicine ? ?Our next appointment is by telephone on 01/10/22 at 10:45 AM ? ?Please call the care guide team at [phone] if you need to cancel or reschedule your appointment.  ? ?If you are experiencing a Mental Health or [place] or need someone to talk to, please call the Suicide and Crisis Lifeline: 988 ?call the [place] National Suicide Prevention Lifeline: (463)338-0298 or TTY: (573)557-1215 TTY (413) 622-6511) to talk  to a trained counselor ?call 1-800-273-TALK (toll free, 24 hour hotline) ?go to [facility] [address], [place] [phone]) ?call 911  ? ?The patient verbalized understanding of instructions, educational materials, and care plan provided today and agreed to receive a mailed copy of patient instructions, educational materials, and care plan.  ? ?Peter Garter RN, BSN,CCM, CDE ?Care Management Coordinator ?[facility] ?[phone]   ?

## 2021-12-06 NOTE — Chronic Care Management (AMB) (Signed)
Chronic Care Management   CCM RN Visit Note  12/06/2021 Name: Pamela Patton MRN: [id number] DOB: [birth year]  Subjective: Pamela Patton is a 76 y.o. year old female who is a primary care patient of Koberlein, Steele Berg, MD. The care management team was consulted for assistance with disease management and care coordination needs.    Engaged with patient by telephone for follow up visit in response to provider referral for case management and/or care coordination services.   Consent to Services:  The patient was given information about Chronic Care Management services, agreed to services, and gave verbal consent prior to initiation of services.  Please see initial visit note for detailed documentation.   Patient agreed to services and verbal consent obtained.   Assessment: Review of patient past medical history, allergies, medications, health status, including review of consultants reports, laboratory and other test data, was performed as part of comprehensive evaluation and provision of chronic care management services.   SDOH (Social Determinants of Health) assessments and interventions performed:    CCM Care Plan  No Known Allergies  Outpatient Encounter Medications as of 12/06/2021  Medication Sig Note   aspirin 81 MG tablet Take 81 mg by mouth daily with lunch.     atorvastatin (LIPITOR) 40 MG tablet Take 40 mg by mouth daily with lunch.     Blood Pressure Monitoring (BLOOD PRESSURE CUFF) MISC 1 Product by Does not apply route as needed.    Calcium Carbonate-Vitamin D 600-400 MG-UNIT tablet Take 1 tablet by mouth 3 (three) times a week.     carbidopa-levodopa (SINEMET IR) 25-100 MG tablet Take 2 tablets by mouth 3 (three) times daily. 8am/noon/4pm.    Cholecalciferol (VITAMIN D PO) Take 5,000 Units by mouth 3 (three) times a week.     Cyanocobalamin (VITAMIN B12 PO) Take 1 tablet by mouth daily.    glucose 4 GM chewable tablet Chew 1 tablet by mouth as needed for low blood sugar.  06/30/2020: As needed    glucose blood (ACCU-CHEK AVIVA PLUS) test strip Use to test blood sugar 4 times a day (Dx: E11.65)    HUMALOG KWIKPEN 100 UNIT/ML KiwkPen Inject 0-4 Units into the skin 3 (three) times daily with meals. Sliding scale per Dr Altheimer's office notes from 09/24/2019    hydrochlorothiazide (HYDRODIURIL) 25 MG tablet Take 25 mg by mouth daily with lunch.     insulin glargine (LANTUS) 100 UNIT/ML injection Inject 0.05 mLs (5 Units total) into the skin every evening. (Patient taking differently: Inject 5 Units into the skin daily.)    Insulin Syringe-Needle U-100 (INSULIN SYRINGE .3CC/31GX5/16") 31G X 5/16" 0.3 ML MISC Use as directed for Lantus injection once a day    levothyroxine (SYNTHROID, LEVOTHROID) 75 MCG tablet Take 75 mcg by mouth daily. 08/13/2021: Patient no taking 1 day at week (Sunday)   losartan (COZAAR) 25 MG tablet TAKE 2 TABLETS BY MOUTH EVERY DAY    NONFORMULARY OR COMPOUNDED ITEM [facility]:  Antifungal - Terbinafine 3%, Fluconazole 2%, Tea Tree Oil 5%, Urea 10%, Ibuprofen 2% in DMSO Suspension #29m. Apply to the affected nail(s) once (at bedtime) or twice daily.    Nutritional Supplements (FEEDING SUPPLEMENT, GLUCERNA 1.2 CAL,) LIQD Take 237 mLs by mouth daily as needed. 06/30/2020: In place of meal when out at time   OSUPERVALU INCLancets 316BMISC Use to test blood sugar 4 times a day (Dx: E11.65)    Pioglitazone HCl (ACTOS PO) Take 45 mg by mouth daily with lunch.  Polyethyl Glycol-Propyl Glycol (SYSTANE) 0.4-0.3 % GEL ophthalmic gel Place 1 application into both eyes at bedtime as needed (FOR EYES).    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   Carotid artery disease ([place]) 03/21/2017   Hypertension associated with diabetes ([place]) 02/22/2017    Hyperlipidemia associated with type 2 diabetes mellitus ([place]) 02/22/2017   Hyperuricemia 10/31/2016   PD (Parkinson's disease) ([place]) 08/08/2016   Loss of weight 07/25/2016   DM (diabetes mellitus), type 2 with renal complications 37/85/8850   CKD (chronic kidney disease) stage 3, GFR 30-59 ml/min (HCC) 10/08/2012   Vitamin D deficiency 08/24/2012   Hypothyroid, s/p radioactive iodine tx for graves disease     Conditions to be addressed/monitored:HTN, HLD, DMII, CKD Stage 3, and Parkinson's disease  Care Plan : RN Care Manager Plan of Care  Updates made by Dimitri Ped, RN since 12/06/2021 12:00 AM     Problem: Chronic Disease Management and Care Coordination Needs (DM, Parkinsons Disease, CAD,CKD3, HTN ,HLD)   Priority: High     Long-Range Goal: Establish Plan of Care for Chronic Disease Management Needs(DM, Parkinson's Disease, CAD,CKD3, HTN ,HLD)   Start Date: 11/15/2021  Expected End Date: 11/15/2022  Priority: High  Note:   Current Barriers:  Knowledge Deficits related to plan of care for management of CAD, HTN, HLD, DMII, CKD Stage 3, and Parkinson's Disease  Care Coordination needs related to Limited social support, Transportation, and ADL IADL limitations Chronic Disease Management support and education needs related to CAD, HTN, HLD, DMII, CKD Stage 3, and Parkinson's Disease   Lacks caregiver support Transportation barriers  States that her Parkinson's disease makes her slower.  States she still spends a lot of time keeping up with her medications and insulin.  States she has been doing better taking her pills on time.  States has tried taking her Lantus in the morning but she still has missed taking sometimes.  States she checks her CBG 3-4 times a day and doses her insulin depending on her blood sugars.  States her CBGs in the morning have ranged 120-171 and 140-280 later in the day.   Denies any recent falls. States she tries to eat healthy. STates she has not checked her  B/P lately. Denies any chest pains or shortness of breath. Reports she received the [place]  calendar  RNCM Clinical Goal(s):  Patient will verbalize understanding of plan for management of CAD, HTN, HLD, DMII, CKD Stage 3, and Parkinson's Disease  as evidenced by voiced adherence to plan of care verbalize basic understanding of  CAD, HTN, HLD, DMII, CKD Stage 3, and Parkinson's Disease  disease process and self health management plan as evidenced by voiced understanding and teach back take all medications exactly as prescribed and will call provider for medication related questions as evidenced by dispense report and pt verbalization attend all scheduled medical appointments: CCM LCSW 12/15/21, CCM Pharm D 12/24/21 as evidenced by medical records demonstrate Improved adherence to prescribed treatment plan for CAD, HTN, HLD, DMII, CKD Stage 3, and Parkinson's Disease  as evidenced by readings within limits, voiced adherence to plan of care continue to work with RN Care Manager to address care management and care coordination needs related to  CAD, HTN, HLD, DMII, CKD Stage 3, and Parkinson's Disease  as evidenced by adherence to CM Team Scheduled appointments work with pharmacist to address polypharmacy and adherence issues related toCAD, HTN, HLD, DMII, CKD Stage 3, and Parkinson's Disease  as evidenced by review or EMR and patient or pharmacist report work with Education officer, museum to address  related to the management of Limited social support, Transportation, Level of care concerns, and ADL IADL limitations related to the management of CAD, HTN, HLD, DMII, CKD Stage 3, and Parkinson's Disease  as evidenced by review of EMR and patient or Education officer, museum report through collaboration with Consulting civil engineer, provider, and care team.   Interventions: 1:1 collaboration with primary care provider regarding development and update of comprehensive plan of care as evidenced by provider attestation and  co-signature Inter-disciplinary care team collaboration (see longitudinal plan of care) Evaluation of current treatment plan related to  self management and patient's adherence to plan as established by provider   CAD Interventions: (Status:  Goal on track:  Yes.) Long Term Goal Assessed understanding of CAD diagnosis Medications reviewed including medications utilized in CAD treatment plan Provided education on importance of blood pressure control in management of CAD Provided education on Importance of limiting foods high in cholesterol Counseled on importance of regular laboratory monitoring as prescribed Reviewed Importance of taking all medications as prescribed Reviewed Importance of attending all scheduled provider appointments Advised to report any changes in symptoms or exercise tolerance    Chronic Kidney Disease Interventions:  (Status:  Goal on track:  Yes.) Long Term Goal Evaluation of current treatment plan related to chronic kidney disease self management and patient's adherence to plan as established by provider      Provided education to patient re: stroke prevention, s/s of heart attack and stroke    Reviewed medications with patient and discussed importance of compliance    Discussed complications of poorly controlled blood pressure such as heart disease, stroke, circulatory complications, vision complications, kidney impairment, sexual dysfunction    Discussed the impact of chronic kidney disease on daily life and mental health and acknowledged and normalized feelings of disempowerment, fear, and frustration    Last practice recorded BP readings:  BP Readings from Last 3 Encounters:  11/01/21 136/78  08/13/21 118/60  05/04/21 110/66  Most recent eGFR/CrCl: No results found for: EGFR  No components found for: CRCL    Diabetes Interventions:  (Status:  Goal on track:  Yes.) Long Term Goal Assessed patient's understanding of A1c goal: <7% Provided education to  patient about basic DM disease process Reviewed medications with patient and discussed importance of medication adherence Counseled on importance of regular laboratory monitoring as prescribed Provided patient with written educational materials related to hypo and hyperglycemia and importance of correct treatment Advised patient, providing education and rationale, to check cbg 3-4 times daily and record, calling provider for findings outside established parameters Referral made to pharmacy team for assistance with polypharmacy, complicated medication regime and adherence issues Referral made to social work team for assistance with scheduled with CCM LCSW 12/15/21 to address housing, level of care issues Review of patient status, including review of consultants reports, relevant laboratory and other test results, and medications completed Reinforced importance of taking insulin and CBGs as ordered and to try taking her Lantus in the morning as endocrinologist has directed. Reviewed that if that time does nto work to try to find a time of day that she can consistently remember to take her Lantus Lab Results  Component Value Date   HGBA1C 8.1 (H)  09/11/2018  Last Hemoglobin A1C at endocrinologist 7.3% 10/29/21  Parkinson's Disease   (Status:  Goal on track:  Yes.)  Long Term Goal Evaluation of current treatment plan related to  Parkinson's Disease  ,  Parkinson's Disease   self-management and patient's adherence to plan as established by provider. Discussed plans with patient for ongoing care management follow up and provided patient with direct contact information for care management team Evaluation of current treatment plan related to Parkinson's Disease  and patient's adherence to plan as established by provider Provided education to patient re: Parkinson's Disease  Social Work referral for scheduled with CCM LCSW 12/15/21 to address housing, level of care issues Pharmacy referral for polypharmacy,  complicated medication regime and adherence issues  scheduled 12/24/21 Reinforced instructions on home safety and fall precautions. Reinforced to contact her insurance company to check on benefit for personal emergency alarm   Hyperlipidemia Interventions:  (Status:  Goal on track:  Yes.) Long Term Goal Medication review performed; medication list updated in electronic medical record.  Provider established cholesterol goals reviewed Counseled on importance of regular laboratory monitoring as prescribed Reviewed role and benefits of statin for ASCVD risk reduction Reviewed importance of limiting foods high in cholesterol  Hypertension Interventions:  (Status:  Goal on track:  Yes.) Long Term Goal Last practice recorded BP readings:  BP Readings from Last 3 Encounters:  11/01/21 136/78  08/13/21 118/60  05/04/21 110/66  Most recent eGFR/CrCl: No results found for: EGFR  No components found for: CRCL  Evaluation of current treatment plan related to hypertension self management and patient's adherence to plan as established by provider Provided education to patient re: stroke prevention, s/s of heart attack and stroke Reviewed medications with patient and discussed importance of compliance Counseled on the importance of exercise goals with target of 150 minutes per week Discussed plans with patient for ongoing care management follow up and provided patient with direct contact information for care management team Provided education on prescribed diet low sodium low CHO heart healthy Reviewed to try to take B/P at least weekly    Patient Goals/Self-Care Activities: Take all medications as prescribed Attend all scheduled provider appointments Call pharmacy for medication refills 3-7 days in advance of running out of medications Attend church or other social activities Call provider office for new concerns or questions  Work with the social worker to address care coordination needs and will  continue to work with the clinical team to address health care and disease management related needs keep appointment with eye doctor check blood sugar at prescribed times: before meals and at bedtime and when you have symptoms of low or high blood sugar take the blood sugar log to all doctor visits drink 6 to 8 glasses of water each day fill half of plate with vegetables switch to sugar-free drinks Take Lantus insulin in the morning check blood pressure weekly choose a place to take my blood pressure (home, clinic or office, retail store) take blood pressure log to all doctor appointments keep all doctor appointments begin an exercise program eat more whole grains, fruits and vegetables, lean meats and healthy fats limit salt intake to 2395m/day call for medicine refill 2 or 3 days before it runs out take all medications exactly as prescribed call doctor with any symptoms you believe are related to your medicine  Follow Up Plan:  Telephone follow up appointment with care management team member scheduled for:  01/10/22 The patient has been provided with contact information for  the care management team and has been advised to call with any health related questions or concerns.       Plan:Telephone follow up appointment with care management team member scheduled for:  01/10/22 The patient has been provided with contact information for the care management team and has been advised to call with any health related questions or concerns.  Peter Garter RN, Jackquline Denmark, CDE Care Management Coordinator [facility] [phone]

## 2021-12-08 ENCOUNTER — Telehealth: Payer: Self-pay | Admitting: Pharmacist

## 2021-12-08 NOTE — Chronic Care Management (AMB) (Signed)
? ? ?Chronic Care Management ?Pharmacy Assistant  ? ?Name: Pamela Patton  MRN: [id number] DOB: 1946/04/02 ? ?Pamela Patton is an 76 y.o. year old female who presents for her initial CCM visit with the clinical pharmacist. ? ?Reason for Encounter: Chart prep for initial visit with Jeni Salles, Clinical Pharmacist on 12/15/2021 at 9:00 via the telephone. ?  ?Conditions to be addressed/monitored: ?CAD, HTN, DMII, CKD Stage 3, and Hypothyroidism ? ?Recent office visits:  ?11/01/2021 Micheline Rough MD - Patient was seen for preventative health care and additional issues. Changed Lantus to 5 units every evening. Discontinued Mirtazapine. No follow up noted.  ? ?Recent consult visits:  ?08/13/2021 Quay Burow MD (cardiology) - Patient was seen for Hypertension associated with diabetes and additional issues. No medication changes. Follow up in 1 year. ? ?Hospital visits:  ?None ? ?Medications: ?Outpatient Encounter Medications as of 12/08/2021  ?Medication Sig Note  ? aspirin 81 MG tablet Take 81 mg by mouth daily with lunch.    ? atorvastatin (LIPITOR) 40 MG tablet Take 40 mg by mouth daily with lunch.    ? Blood Pressure Monitoring (BLOOD PRESSURE CUFF) MISC 1 Product by Does not apply route as needed.   ? Calcium Carbonate-Vitamin D 600-400 MG-UNIT tablet Take 1 tablet by mouth 3 (three) times a week.    ? carbidopa-levodopa (SINEMET IR) 25-100 MG tablet Take 2 tablets by mouth 3 (three) times daily. 8am/noon/4pm.   ? Cholecalciferol (VITAMIN D PO) Take 5,000 Units by mouth 3 (three) times a week.    ? Cyanocobalamin (VITAMIN B12 PO) Take 1 tablet by mouth daily.   ? glucose 4 GM chewable tablet Chew 1 tablet by mouth as needed for low blood sugar. 06/30/2020: As needed   ? glucose blood (ACCU-CHEK AVIVA PLUS) test strip Use to test blood sugar 4 times a day (Dx: E11.65)   ? HUMALOG KWIKPEN 100 UNIT/ML KiwkPen Inject 0-4 Units into the skin 3 (three) times daily with meals. Sliding scale per Dr Altheimer's office  notes from 09/24/2019   ? hydrochlorothiazide (HYDRODIURIL) 25 MG tablet Take 25 mg by mouth daily with lunch.    ? insulin glargine (LANTUS) 100 UNIT/ML injection Inject 0.05 mLs (5 Units total) into the skin every evening. (Patient taking differently: Inject 5 Units into the skin daily.)   ? Insulin Syringe-Needle U-100 (INSULIN SYRINGE .3CC/31GX5/16") 31G X 5/16" 0.3 ML MISC Use as directed for Lantus injection once a day   ? levothyroxine (SYNTHROID, LEVOTHROID) 75 MCG tablet Take 75 mcg by mouth daily. 08/13/2021: Patient no taking 1 day at week (Sunday)  ? losartan (COZAAR) 25 MG tablet TAKE 2 TABLETS BY MOUTH EVERY DAY   ? NONFORMULARY OR COMPOUNDED ITEM [facility]:  Antifungal - Terbinafine 3%, Fluconazole 2%, Tea Tree Oil 5%, Urea 10%, Ibuprofen 2% in DMSO Suspension #62m. Apply to the affected nail(s) once (at bedtime) or twice daily.   ? Nutritional Supplements (FEEDING SUPPLEMENT, GLUCERNA 1.2 CAL,) LIQD Take 237 mLs by mouth daily as needed. 06/30/2020: In place of meal when out at time  ? OneTouch [place]ica Lancets 329NMISC Use to test blood sugar 4 times a day (Dx: E11.65)   ? Pioglitazone HCl (ACTOS PO) Take 45 mg by mouth daily with lunch.    ? Polyethyl Glycol-Propyl Glycol (SYSTANE) 0.4-0.3 % GEL ophthalmic gel Place 1 application into both eyes at bedtime as needed (FOR EYES).   ? ?Facility-Administered Encounter Medications as of 12/08/2021  ?Medication  ? 0.9 %  sodium chloride infusion  ?Fill History: ?ATORVASTATIN 40 MG TABLET 10/21/2021 90  ? ?CARBIDOPA-LEVODOPA 25-100 TAB 12/02/2021 90  ? ?LANTUS 100 UNIT/ML VIAL 03/12/2021 30  ? ?SYNTHROID 75 MCG TABLET 12/05/2021 90  ? ?LOSARTAN POTASSIUM 25 MG TAB 11/08/2021 90  ? ?PIOGLITAZONE HCL 45 MG TABLET 10/21/2021 90  ? ?HYDROCHLOROTHIAZIDE 25 MG TAB 10/21/2021 90  ? ?Have you seen any other providers since your last visit? **Patient denies any other visits recently. ? ?Any changes in your medications or health? Patient denies any changes,  she mentions her insulin is taken on a sliding scale.  ? ?Any side effects from any medications? Patient denies any side effects. ? ?Do you have an symptoms or problems not managed by your medications? Patient denies. ? ?Any concerns about your health right now? Patient denies any concerns at this time.  ? ?Has your provider asked that you check blood pressure, blood sugar, or follow special diet at home? Patient does check her blood sugars daily, she hasn't checked her blood pressure in a while and tries to make healthy choices.  ? ?Do you get any type of exercise on a regular basis? Patient stated she does ride a stationary bike for an hour daily.  ? ?Can you think of a goal you would like to reach for your health? Patient would like to get her A1C much lower.  ? ?Do you have any problems getting your medications? Patient denies any issues getting medications.  ? ?Is there anything that you would like to discuss during the appointment? Patient denies.  ? ?Please bring medications and supplements to appointment ? ? ?Care Gaps: ?Last AWV - completed on 06/30/2021 ?Last BP - 136/78 on 11/01/2021 ?Last A1C -10.1 on 10/28/2021 ?Shingrix - never done ?Covid booster - overdue ?Foot Exam - overdue ?Colonoscopy - overdue ? ?Star Rating Drugs: ?Atorvastatin 40 mg  - last filled 10/21/2021 90 [facility] on [facility] ?Losartan 25 mg - last filled 11/08/2021 [facility] ?Actos 45 mg - last filled 10/21/2021 90 [facility] ? ?Gennie Alma CMA  ?Clinical Pharmacist Assistant ?[phone] ? ?

## 2021-12-15 ENCOUNTER — Ambulatory Visit: Payer: Medicare PPO | Admitting: Pharmacist

## 2021-12-15 DIAGNOSIS — I152 Hypertension secondary to endocrine disorders: Secondary | ICD-10-CM

## 2021-12-15 DIAGNOSIS — E1129 Type 2 diabetes mellitus with other diabetic kidney complication: Secondary | ICD-10-CM

## 2021-12-15 DIAGNOSIS — E1159 Type 2 diabetes mellitus with other circulatory complications: Secondary | ICD-10-CM

## 2021-12-15 DIAGNOSIS — Z794 Long term (current) use of insulin: Secondary | ICD-10-CM

## 2021-12-15 NOTE — Patient Instructions (Addendum)
Hi Dinesha, ? ?It was great to get to meet you over the telephone! Below is a summary of some of the topics we discussed.  ? ?Don't forget to look for calcium citrate 500-600 mg per day when you run out of your current calcium product. ? ?Please reach out to me if you have any questions or need anything before I reach back out to you! ? ?Best, ?Maddie ? ?Jeni Salles, PharmD, BCACP ?Clinical Pharmacist ?Therapist, music at [place] ?[phone] ? ? Visit Information ? ? Goals Addressed   ? ?  ?  ?  ?  ? This Visit's Progress  ?  Track and Manage My Blood Pressure-Hypertension     ?  Timeframe:  Long-Range Goal ?Priority:  High ?Start Date:                             ?Expected End Date:                      ? ?Follow Up Date 02/14/22  ?  ?- check blood pressure weekly ?- choose a place to take my blood pressure (home, clinic or office, retail store) ?- write blood pressure results in a log or diary  ?  ?Why is this important?   ?You won't feel high blood pressure, but it can still hurt your blood vessels.  ?High blood pressure can cause heart or kidney problems. It can also cause a stroke.  ?Making lifestyle changes like losing a little weight or eating less salt will help.  ?Checking your blood pressure at home and at different times of the day can help to control blood pressure.  ?If the doctor prescribes medicine remember to take it the way the doctor ordered.  ?Call the office if you cannot afford the medicine or if there are questions about it.   ?  ?Notes:  ?  ? ?  ? ?Patient Care Plan: RN Care Manager Plan of Care  ?  ? ?Problem Identified: Chronic Disease Management and Care Coordination Needs (DM, Parkinsons Disease, CAD,CKD3, HTN ,HLD)   ?Priority: High  ?  ? ?Long-Range Goal: Establish Plan of Care for Chronic Disease Management Needs(DM, Parkinson's Disease, CAD,CKD3, HTN ,HLD)   ?Start Date: 11/15/2021  ?Expected End Date: 11/15/2022  ?Priority: High  ?Note:   ?Current Barriers:  ?Knowledge Deficits  related to plan of care for management of CAD, HTN, HLD, DMII, CKD Stage 3, and Parkinson's Disease  ?Care Coordination needs related to Limited social support, Transportation, and ADL IADL limitations ?Chronic Disease Management support and education needs related to CAD, HTN, HLD, DMII, CKD Stage 3, and Parkinson's Disease   ?Lacks caregiver support ?Transportation barriers  ?States that her Parkinson's disease makes her slower.  States she still spends a lot of time keeping up with her medications and insulin.  States she has been doing better taking her pills on time.  States has tried taking her Lantus in the morning but she still has missed taking sometimes.  States she checks her CBG 3-4 times a day and doses her insulin depending on her blood sugars.  States her CBGs in the morning have ranged 120-171 and 140-280 later in the day.   Denies any recent falls. States she tries to eat healthy. STates she has not checked her B/P lately. Denies any chest pains or shortness of breath. Reports she received the [place]  calendar ? ?RNCM Clinical  Goal(s):  ?Patient will verbalize understanding of plan for management of CAD, HTN, HLD, DMII, CKD Stage 3, and Parkinson's Disease  as evidenced by voiced adherence to plan of care ?verbalize basic understanding of  CAD, HTN, HLD, DMII, CKD Stage 3, and Parkinson's Disease  disease process and self health management plan as evidenced by voiced understanding and teach back ?take all medications exactly as prescribed and will call provider for medication related questions as evidenced by dispense report and pt verbalization ?attend all scheduled medical appointments: CCM LCSW 12/15/21, CCM Pharm D 12/24/21 as evidenced by medical records ?demonstrate Improved adherence to prescribed treatment plan for CAD, HTN, HLD, DMII, CKD Stage 3, and Parkinson's Disease  as evidenced by readings within limits, voiced adherence to plan of care ?continue to work with RN Care  Manager to address care management and care coordination needs related to  CAD, HTN, HLD, DMII, CKD Stage 3, and Parkinson's Disease  as evidenced by adherence to CM Team Scheduled appointments ?work with pharmacist to address polypharmacy and adherence issues related toCAD, HTN, HLD, DMII, CKD Stage 3, and Parkinson's Disease  as evidenced by review or EMR and patient or pharmacist report ?work with Education officer, museum to address  related to the management of Limited social support, Transportation, Level of care concerns, and ADL IADL limitations related to the management of CAD, HTN, HLD, DMII, CKD Stage 3, and Parkinson's Disease  as evidenced by review of EMR and patient or Education officer, museum report through collaboration with Consulting civil engineer, provider, and care team.  ? ?Interventions: ?1:1 collaboration with primary care provider regarding development and update of comprehensive plan of care as evidenced by provider attestation and co-signature ?Inter-disciplinary care team collaboration (see longitudinal plan of care) ?Evaluation of current treatment plan related to  self management and patient's adherence to plan as established by provider ? ? ?CAD Interventions: (Status:  Goal on track:  Yes.) Long Term Goal ?Assessed understanding of CAD diagnosis ?Medications reviewed including medications utilized in CAD treatment plan ?Provided education on importance of blood pressure control in management of CAD ?Provided education on Importance of limiting foods high in cholesterol ?Counseled on importance of regular laboratory monitoring as prescribed ?Reviewed Importance of taking all medications as prescribed ?Reviewed Importance of attending all scheduled provider appointments ?Advised to report any changes in symptoms or exercise tolerance ? ? ? Chronic Kidney Disease Interventions:  (Status:  Goal on track:  Yes.) Long Term Goal ?Evaluation of current treatment plan related to chronic kidney disease self management and  patient's adherence to plan as established by provider      ?Provided education to patient re: stroke prevention, s/s of heart attack and stroke    ?Reviewed medications with patient and discussed importance of compliance    ?Discussed complications of poorly controlled blood pressure such as heart disease, stroke, circulatory complications, vision complications, kidney impairment, sexual dysfunction    ?Discussed the impact of chronic kidney disease on daily life and mental health and acknowledged and normalized feelings of disempowerment, fear, and frustration    ?Last practice recorded BP readings:  ?BP Readings from Last 3 Encounters:  ?11/01/21 136/78  ?08/13/21 118/60  ?05/04/21 110/66  ?Most recent eGFR/CrCl: No results found for: EGFR  No components found for: CRCL ? ? ? ?Diabetes Interventions:  (Status:  Goal on track:  Yes.) Long Term Goal ?Assessed patient's understanding of A1c goal: <7% ?Provided education to patient about basic DM disease process ?Reviewed medications with patient and discussed importance  of medication adherence ?Counseled on importance of regular laboratory monitoring as prescribed ?Provided patient with written educational materials related to hypo and hyperglycemia and importance of correct treatment ?Advised patient, providing education and rationale, to check cbg 3-4 times daily and record, calling provider for findings outside established parameters ?Referral made to pharmacy team for assistance with polypharmacy, complicated medication regime and adherence issues ?Referral made to social work team for assistance with scheduled with CCM LCSW 12/15/21 to address housing, level of care issues ?Review of patient status, including review of consultants reports, relevant laboratory and other test results, and medications completed ?Reinforced importance of taking insulin and CBGs as ordered and to try taking her Lantus in the morning as endocrinologist has directed. Reviewed that if  that time does nto work to try to find a time of day that she can consistently remember to take her Lantus ?Lab Results  ?Component Value Date  ? HGBA1C 8.1 (H) 09/11/2018  ?Last Hemoglobin A1C at endocrin

## 2021-12-15 NOTE — Progress Notes (Signed)
? ?Chronic Care Management ?Pharmacy Note ? ?12/16/2021 ?Name:  Pamela Patton MRN:  [id number] DOB:  Sep 22, 1946 ? ?Summary: ?A1c not at goal < 8% ?Pt is having difficulty managing medications ? ?Recommendations/Changes made from today's visit: ?-Recommended switching losartan to 1 of the 50 mg tablets daily and to the evening ?-Recommend switching calcium carbonate to calcium citrate for better absorption and recommended one a day ?-Recommended CGM for A1c lowering ?-Recommended SGTL2 inhibitor for blood sugar lowering and CKD benefits ?-Recommend repeat DEXA ?-Transition to [facility] for adherence packaging ? ?Plan: ?DM assessment in 1 month ?Follow up in 3 months ? ?Subjective: ?Pamela Patton is an 76 y.o. year old female who is a primary patient of Koberlein, Steele Berg, MD.  The CCM team was consulted for assistance with disease management and care coordination needs.   ? ?Engaged with patient by telephone for initial visit in response to provider referral for pharmacy case management and/or care coordination services.  ? ?Consent to Services:  ?The patient was given the following information about Chronic Care Management services today, agreed to services, and gave verbal consent: 1. CCM service includes personalized support from designated clinical staff supervised by the primary care provider, including individualized plan of care and coordination with other care providers 2. 24/7 contact phone numbers for assistance for urgent and routine care needs. 3. Service will only be billed when office clinical staff spend 20 minutes or more in a month to coordinate care. 4. Only one practitioner may furnish and bill the service in a calendar month. 5.The patient may stop CCM services at any time (effective at the end of the month) by phone call to the office staff. 6. The patient will be responsible for cost sharing (co-pay) of up to 20% of the service fee (after annual deductible is met). Patient agreed to  services and consent obtained. ? ?Patient Care Team: ?Caren Macadam, MD as PCP - General (Family Medicine) ?Lorretta Harp, MD as PCP - Cardiology (Cardiology) ?Ardis Hughs, MD as Attending Physician (Urology) ?Armbruster, Carlota Raspberry, MD as Consulting Physician (Gastroenterology) ?Altheimer, Legrand Como, MD as Referring Physician (Endocrinology) ?Ludwig Clarks, DO as Consulting Physician (Neurology) ?Rebekah Chesterfield, LCSW as Education officer, museum (Licensed Holiday representative) ?Dimitri Ped, RN as [place] Management ?Viona Gilmore, [facility] as Pharmacist (Pharmacist) ? ?Recent office visits: ?11/01/2021 Micheline Rough MD - Patient was seen for preventative health care and additional issues. Changed Lantus to 5 units every evening. Discontinued Mirtazapine. No follow up noted.  ? ?Recent consult visits: ?10/29/21 Daine Floras, MD (endo): Patient presented for DM follow up. ? ?08/13/2021 Quay Burow MD (cardiology) - Patient was seen for Hypertension associated with diabetes and additional issues. No medication changes. Follow up in 1 year. ? ?Hospital visits: ?None in previous 6 months ? ? ?Objective: ? ?Lab Results  ?Component Value Date  ? CREATININE 1.53 (H) 08/14/2020  ? BUN 31 (H) 08/14/2020  ? GFR 49.31 (L) 09/11/2018  ? GFRNONAA 35 (L) 08/14/2020  ? GFRAA 43 (L) 11/28/2017  ? NA 142 08/14/2020  ? K 4.3 08/14/2020  ? CALCIUM 9.4 08/14/2020  ? CO2 23 08/14/2020  ? GLUCOSE 299 (H) 08/14/2020  ? ? ?Lab Results  ?Component Value Date/Time  ? HGBA1C 8.1 (H) 09/11/2018 02:19 PM  ? HGBA1C 7.3 05/22/2018 12:00 AM  ? HGBA1C 7.4 02/08/2018 12:00 AM  ? GFR 49.31 (L) 09/11/2018 02:19 PM  ? GFR 55.52 (L) 01/16/2018 12:05 PM  ?  MICROALBUR 1.1 06/17/2014 09:52 AM  ?  ?Last diabetic Eye exam:  ?Lab Results  ?Component Value Date/Time  ? HMDIABEYEEXA Retinopathy (A) 09/10/2020 12:00 AM  ?  ?Last diabetic Foot exam:  ?Lab Results  ?Component Value Date/Time  ? HMDIABFOOTEX good 06/17/2014  12:00 AM  ?  ? ?Lab Results  ?Component Value Date  ? CHOL 152 08/11/2020  ? HDL 72 08/11/2020  ? [place] 70 08/11/2020  ? TRIG 47 08/11/2020  ? CHOLHDL 2.1 08/11/2020  ? ? ?Hepatic Function Latest Ref Rng & Units 08/11/2020 04/27/2017 06/03/2016  ?Total Protein 6.0 - 8.5 g/dL 6.9 - 7.8  ?Albumin 3.7 - 4.7 g/dL 4.5 - 4.7  ?AST 0 - 40 IU/L 14 14 13   ?ALT 0 - 32 IU/L 5 10 10   ?Alk Phosphatase 44 - 121 IU/L 73 55 58  ?Total Bilirubin 0.0 - 1.2 mg/dL 0.5 - 0.6  ?Bilirubin, Direct 0.00 - 0.40 mg/dL 0.16 - -  ? ? ?Lab Results  ?Component Value Date/Time  ? TSH 0.30 (A) 04/27/2017 12:00 AM  ? TSH 0.58 04/02/2015 10:02 AM  ? TSH 0.44 06/17/2014 09:52 AM  ? FREET4 1.21 04/19/2010 03:09 AM  ? ? ?CBC Latest Ref Rng & Units 08/14/2020 09/11/2018 05/08/2018  ?WBC 4.0 - 10.5 K/uL 9.5 4.6 6.1  ?Hemoglobin 12.0 - 15.0 g/dL 9.9(L) 10.3(L) 11.1(L)  ?Hematocrit 36.0 - 46.0 % 32.2(L) 31.3(L) 33.9(L)  ?Platelets 150 - 400 K/uL 272 204.0 240.0  ? ? ?No results found for: VD25OH ? ?Clinical ASCVD: Yes  ?The 10-year ASCVD risk score (Arnett DK, et al., 2019) is: 36.8% ?  Values used to calculate the score: ?    Age: 76 years ?    Sex: Female ?    Is Non-Hispanic African American: Yes ?    Diabetic: Yes ?    Tobacco smoker: No ?    Systolic Blood Pressure: 703 mmHg ?    Is BP treated: Yes ?    HDL Cholesterol: 78 MG/DL ?    Total Cholesterol: 158 MG/DL   ? ?Depression screen [facility] 2/9 11/15/2021 11/01/2021 06/30/2020  ?Decreased Interest 1 1 0  ?Down, Depressed, Hopeless 1 1 0  ?PHQ - 2 Score 2 2 0  ?Altered sleeping 1 1 -  ?Tired, decreased energy 1 2 -  ?Change in appetite 1 1 -  ?Feeling bad or failure about yourself  0 0 -  ?Trouble concentrating 1 1 -  ?Moving slowly or fidgety/restless 1 1 -  ?Suicidal thoughts 0 0 -  ?PHQ-9 Score 7 8 -  ?  ? ? ?Social History  ? ?Tobacco Use  ?Smoking Status Never  ?Smokeless Tobacco Never  ? ?BP Readings from Last 3 Encounters:  ?11/01/21 136/78  ?08/13/21 118/60  ?05/04/21 110/66  ? ?Pulse Readings from Last  3 Encounters:  ?11/01/21 86  ?08/13/21 64  ?05/04/21 97  ? ?Wt Readings from Last 3 Encounters:  ?11/01/21 131 lb 11.2 oz (59.7 kg)  ?08/13/21 131 lb 9.6 oz (59.7 kg)  ?05/04/21 132 lb (59.9 kg)  ? ?BMI Readings from Last 3 Encounters:  ?11/01/21 21.26 kg/m?  ?08/13/21 21.24 kg/m?  ?05/04/21 21.31 kg/m?  ? ? ?Assessment/Interventions: Review of patient past medical history, allergies, medications, health status, including review of consultants reports, laboratory and other test data, was performed as part of comprehensive evaluation and provision of chronic care management services.  ? ?SDOH:  (Social Determinants of Health) assessments and interventions performed: Yes ?SDOH Interventions   ? ?Flowsheet  Row Most Recent Value  ?SDOH Interventions   ?Financial Strain Interventions Intervention Not Indicated  ?Transportation Interventions Intervention Not Indicated  ? ?  ? ?SDOH Screenings  ? ?Alcohol Screen: Not on file  ?Depression (PHQ2-9): Medium Risk  ? PHQ-2 Score: 7  ?Financial Resource Strain: Low Risk   ? Difficulty of Paying Living Expenses: Not very hard  ?Food Insecurity: No Food Insecurity  ? Worried About Charity fundraiser in the Last Year: Never true  ? Ran Out of Food in the Last Year: Never true  ?Housing: Low Risk   ? Last Housing Risk Score: 0  ?Physical Activity: Inactive  ? Days of Exercise per Week: 0 days  ? Minutes of Exercise per Session: 0 min  ?Social Connections: Not on file  ?Stress: Not on file  ?Tobacco Use: Low Risk   ? Smoking Tobacco Use: Never  ? Smokeless Tobacco Use: Never  ? Passive Exposure: Not on file  ?Transportation Needs: No Transportation Needs  ? Lack of Transportation (Medical): No  ? Lack of Transportation (Non-Medical): No  ? ?Patient reports sometimes her day is hectic and varies. She tries to keep a regular schedule for her medications but doesn't always go according to her schedule. She usually wakes up a little later and is slow moving. ? ?Patient doesn't always  sleep well. She feels she needs to get up early to take her Synthroid except Sundays. But on Sundays she gets up earlier to go to church. Sometimes sleep isn't great but sometimes she uses the radio to go

## 2021-12-18 ENCOUNTER — Other Ambulatory Visit: Payer: Self-pay | Admitting: Family Medicine

## 2021-12-18 DIAGNOSIS — M810 Age-related osteoporosis without current pathological fracture: Secondary | ICD-10-CM

## 2021-12-18 MED ORDER — LOSARTAN POTASSIUM 50 MG PO TABS: 50.0000 mg | ORAL_TABLET | Freq: Every day | ORAL | 2 refills | Status: DC

## 2021-12-20 ENCOUNTER — Telehealth: Payer: Self-pay | Admitting: Pharmacist

## 2021-12-20 NOTE — Telephone Encounter (Signed)
Called patient and had to leave a voicemail. Requested a call back for CCM visit follow up. Will try again later this week. ?

## 2021-12-24 ENCOUNTER — Telehealth: Payer: Medicare PPO

## 2021-12-24 NOTE — Telephone Encounter (Signed)
Called patient again and left a voicemail for her to return my call. Requested a call back. ?

## 2021-12-27 ENCOUNTER — Telehealth: Payer: Self-pay | Admitting: *Deleted

## 2021-12-27 NOTE — Chronic Care Management (AMB) (Signed)
?  Care Management  ? ?Note ? ?12/27/2021 ?Name: Pamela Patton MRN: [id number] DOB: 1946/05/19 ? ?Pamela Patton is a 76 y.o. year old female who is a primary care patient of Koberlein, Steele Berg, MD and is actively engaged with the care management team. I reached out to [name] by phone today to assist with re-scheduling a follow up visit with the Licensed Clinical Social Worker ? ?Follow up plan: ?Unsuccessful telephone outreach attempt made. A HIPAA compliant phone message was left for the patient providing contact information and requesting a return call.  ?The care management team will reach out to the patient again over the next 7 days.  ?If patient returns call to provider office, please advise to call [place] at [phone]. ? ?Laverda Sorenson  ?Care Guide, Embedded Care Coordination ?[place]  Care Management  ?Direct Dial: [phone] ? ?

## 2021-12-29 ENCOUNTER — Ambulatory Visit (INDEPENDENT_AMBULATORY_CARE_PROVIDER_SITE_OTHER): Payer: Medicare PPO

## 2021-12-29 VITALS — Ht 66.0 in | Wt 131.0 lb

## 2021-12-29 DIAGNOSIS — Z0000 Encounter for general adult medical examination without abnormal findings: Secondary | ICD-10-CM

## 2021-12-29 NOTE — Progress Notes (Signed)
? ?Subjective:  ? Pamela Patton is a 76 y.o. female who presents for Medicare Annual (Subsequent) preventive examination. ? ?Review of Systems    ?Virtual Visit via Telephone Note ? ?I connected with  Pamela Patton on 12/29/21 at  1:15 PM EDT by telephone and verified that I am speaking with the correct person using two identifiers. ? ?Location: ?Patient: Home ?Provider: Office ?Persons participating in the virtual visit: patient/Nurse Health Advisor ?  ?I discussed the limitations, risks, security and privacy concerns of performing an evaluation and management service by telephone and the availability of in person appointments. The patient expressed understanding and agreed to proceed. ? ?Interactive audio and video telecommunications were attempted between this nurse and patient, however failed, due to patient having technical difficulties OR patient did not have access to video capability.  We continued and completed visit with audio only. ? ?Some vital signs may be absent or patient reported.  ? ?Pamela Peaches, LPN  ?Cardiac Risk Factors include: advanced age (>68mn, >>36women);diabetes mellitus;hypertension ? ?   ?Objective:  ?  ?Today's Vitals  ? 12/29/21 1321  ?Weight: 131 lb (59.4 kg)  ?Height: '5\' 6"'$  (1.676 m)  ? ?Body mass index is 21.14 kg/m?. ? ? ?  12/29/2021  ?  1:38 PM 11/15/2021  ? 10:18 AM 05/04/2021  ?  8:22 AM 11/20/2020  ?  8:15 AM 08/14/2020  ?  9:38 PM 06/30/2020  ? 11:34 AM 05/19/2020  ?  2:56 PM  ?Advanced Directives  ?Does Patient Have a Medical Advance Directive? Yes Yes Yes Yes No Yes Yes  ?Type of [place] Living will   Living will Living will  ?Does patient want to make changes to medical advance directive? No - Patient declined No - Patient declined       ?Copy of [place]? No - copy requested No - copy requested       ?Would patient like information on creating a medical  advance directive?     No - Patient declined    ? ? ?Current Medications (verified) ?Outpatient Encounter Medications as of 12/29/2021  ?Medication Sig  ? aspirin 81 MG tablet Take 81 mg by mouth daily with lunch.   ? atorvastatin (LIPITOR) 40 MG tablet Take 40 mg by mouth daily with lunch.   ? Blood Pressure Monitoring (BLOOD PRESSURE CUFF) MISC 1 Product by Does not apply route as needed.  ? Calcium Carbonate-Vitamin D 600-400 MG-UNIT tablet Take 1 tablet by mouth 3 (three) times a week.   ? carbidopa-levodopa (SINEMET IR) 25-100 MG tablet Take 2 tablets by mouth 3 (three) times daily. 8am/noon/4pm.  ? Cholecalciferol (VITAMIN D PO) Take 5,000 Units by mouth 3 (three) times a week.   ? Cyanocobalamin (VITAMIN B12 PO) Take 5,000 mcg by mouth 2 (two) times a week.  ? glucose 4 GM chewable tablet Chew 1 tablet by mouth as needed for low blood sugar.  ? glucose blood (ACCU-CHEK AVIVA PLUS) test strip Use to test blood sugar 4 times a day (Dx: E11.65)  ? HUMALOG KWIKPEN 100 UNIT/ML KiwkPen Inject 0-4 Units into the skin 3 (three) times daily with meals. Sliding scale per Dr Altheimer's office notes from 09/24/2019  ? hydrochlorothiazide (HYDRODIURIL) 25 MG tablet Take 25 mg by mouth daily with lunch.   ? insulin glargine (LANTUS) 100 UNIT/ML injection Inject 0.05 mLs (5 Units total) into the skin every evening. (  Patient taking differently: Inject 5 Units into the skin daily.)  ? Insulin Syringe-Needle U-100 (INSULIN SYRINGE .3CC/31GX5/16") 31G X 5/16" 0.3 ML MISC Use as directed for Lantus injection once a day  ? levothyroxine (SYNTHROID, LEVOTHROID) 75 MCG tablet Take 75 mcg by mouth daily.  ? losartan (COZAAR) 50 MG tablet Take 1 tablet (50 mg total) by mouth daily.  ? NONFORMULARY OR COMPOUNDED ITEM [facility]:  Antifungal - Terbinafine 3%, Fluconazole 2%, Tea Tree Oil 5%, Urea 10%, Ibuprofen 2% in DMSO Suspension #57m. Apply to the affected nail(s) once (at bedtime) or twice daily.  ? Nutritional  Supplements (FEEDING SUPPLEMENT, GLUCERNA 1.2 CAL,) LIQD Take 237 mLs by mouth daily as needed.  ? OneTouch Delica Lancets 362HMISC Use to test blood sugar 4 times a day (Dx: E11.65)  ? Pioglitazone HCl (ACTOS PO) Take 45 mg by mouth daily with lunch.   ? Polyethyl Glycol-Propyl Glycol (SYSTANE) 0.4-0.3 % GEL ophthalmic gel Place 1 application into both eyes at bedtime as needed (FOR EYES).  ? ?Facility-Administered Encounter Medications as of 12/29/2021  ?Medication  ? 0.9 %  sodium chloride infusion  ? ? ?Allergies (verified) ?Patient has no known allergies.  ? ?History: ?Past Medical History:  ?Diagnosis Date  ? Anemia   ? Arthritis   ? 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 Carotid artery disease 03/21/2017  ? Cataract   ? immature   ? Chest pain summer 2012  ? Chicken pox   ? CKD (chronic kidney disease) 10/08/2012  ? Stage 3, GFR 30-59 ml/min  ? Diverticulosis   ? difficulty with colonoscopy 2005  ? DM (diabetes mellitus), type 2 with renal complications 047/65/4650 ? Fracture   ? radial/ulnar 03/2016  ? GERD (gastroesophageal reflux disease)   ? Heart murmur   ? innocent murmur  ? High cholesterol   ? Hypertension associated with diabetes 02/22/2017  ? Hyperuricemia 10/31/2016  ? Hypothyroidism   ? s/p radioactive iodine tx for graves in 2001  ? Intestinal metaplasia of gastric mucosa   ? 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 Parkinson's disease 08/08/2016  ? UTI (urinary tract infection)   ? Vitamin D deficiency 08/24/2012  ? Weight loss   ? eval with GI in 2016; s/p CT/MRI and PET scans  ? ?Past Surgical History:  ?Procedure Laterality Date  ? ABDOMINAL HYSTERECTOMY  1989  ? CATARACT EXTRACTION, BILATERAL    ? COLONOSCOPY    ? DG BARIUM SWALLOW (ALemhiHX)    ? 9 yrs ago per pt due to incomplete colonsocopy  ? ?Family History  ?Problem Relation Age of Onset  ? Cancer Mother   ?     liver  ? Liver cancer Mother   ? Stroke Father 742 ? Diabetes Father   ? Hypertension Father   ? Diabetes Maternal Grandmother    ? Prostate cancer Maternal Grandfather   ? Pancreatic cancer Brother   ? Diabetes Sister   ? Cancer Sister   ? Diabetes Sister   ? Colon polyps Neg Hx   ? Colon cancer Neg Hx   ? Esophageal cancer Neg Hx   ? Rectal cancer Neg Hx   ? Stomach cancer Neg Hx   ? ?Social History  ? ?Socioeconomic History  ? Marital status: Single  ?  Spouse name: Not on file  ? Number of children: 1  ? Years of education: 273 ? Highest education level: Professional school degree (e.g., MD, DDS, DVM, JD)  ?Occupational History  ?  Occupation: retired  ?  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 Smoking status: Never  ? Smokeless tobacco: Never  ?Vaping Use  ? Vaping Use: Never used  ?Substance and Sexual Activity  ? Alcohol use: No  ?  Alcohol/week: 0.0 standard drinks  ? Drug use: No  ? Sexual activity: Not on file  ?Other Topics Concern  ? Not on file  ?Social History Narrative  ? 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   ? Home Situation:  Lives alone, no family around, good friends  ?   ? Spiritual Beliefs: christian, very involved with her church - [employer]  ?   ? Lifestyle: exercising 5 days per week, trying to eat healthy  ?   ?   ?   ?   ? ?Social Determinants of Health  ? ?Financial Resource Strain: Low Risk   ? Difficulty of Paying Living Expenses: Not hard at all  ?Food Insecurity: No Food Insecurity  ? Worried About Charity fundraiser in the Last Year: Never true  ? Ran Out of Food in the Last Year: Never true  ?Transportation Needs: No Transportation Needs  ? Lack of Transportation (Medical): No  ? Lack of Transportation (Non-Medical): No  ?Physical Activity: Insufficiently Active  ? Days of Exercise per Week: 4 days  ? Minutes of Exercise per Session: 30 min  ?Stress: No Stress Concern Present  ? Feeling of Stress : Not at all  ?Social Connections: Moderately Integrated  ? Frequency of Communication with Friends  and Family: More than three times a week  ? Frequency of Social Gatherings with Friends and Family: More than three times a week  ? Attends Religious Services: More than 4 times per year  ? Active Member of

## 2021-12-29 NOTE — Patient Instructions (Addendum)
?Pamela Patton , ?Thank you for taking time to come for your Medicare Wellness Visit. I appreciate your ongoing commitment to your health goals. Please review the following plan we discussed and let me know if I can assist you in the future.  ? ?These are the goals we discussed: ? Goals   ? ?   Manage My Emotions   ?   Timeframe:  Long-Range Goal ?Priority:  Medium ?Start Date:  11/18/21                           ?Expected End Date: 04/01/22                     ? ?Follow Up Date 12/24/21 ?  ?Patient Goals/Self-Care Activities: Over the next 120 days ?Attend all scheduled medical appointments ?Utilize healthy coping skills and/or supportive resources discussed ?Contact PCP office with any questions or concerns ?  ?   patient centered   ?   Get her home more organized ?Finding time is an issue  ?Uses her strengths she dev during fall to help her now ? ?  ?   Patient Stated (pt-stated)   ?   Modify your diet and eat better foods.  ? ?  ?   Patient Stated   ?   Eating more vegan like foods  ?  ?   Track and Manage My Blood Pressure-Hypertension   ?   Timeframe:  Long-Range Goal ?Priority:  High ?Start Date:                             ?Expected End Date:                      ? ?Follow Up Date 02/14/22  ?  ?- check blood pressure weekly ?- choose a place to take my blood pressure (home, clinic or office, retail store) ?- write blood pressure results in a log or diary  ?  ?Why is this important?   ?You won't feel high blood pressure, but it can still hurt your blood vessels.  ?High blood pressure can cause heart or kidney problems. It can also cause a stroke.  ?Making lifestyle changes like losing a little weight or eating less salt will help.  ?Checking your blood pressure at home and at different times of the day can help to control blood pressure.  ?If the doctor prescribes medicine remember to take it the way the doctor ordered.  ?Call the office if you cannot afford the medicine or if there are questions about it.   ?   ?Notes:  ?  ? ?  ?  ?This is a list of the screening recommended for you and due dates:  ?Health Maintenance  ?Topic Date Due  ? Hemoglobin A1C  08/23/2019  ? Complete foot exam   11/24/2020  ? Colon Cancer Screening  07/28/2021  ? Eye exam for diabetics  01/30/2022*  ? Zoster (Shingles) Vaccine (1 of 2) 03/31/2022*  ? Tetanus Vaccine  03/12/2026  ? Pneumonia Vaccine  Completed  ? Flu Shot  Completed  ? DEXA scan (bone density measurement)  Completed  ? COVID-19 Vaccine  Completed  ? Hepatitis C Screening: USPSTF Recommendation to screen - Ages 59-79 yo.  Completed  ? HPV Vaccine  Aged Out  ?*Topic was postponed. The date shown is not the original due date.  ?  ?  Advanced directives: Yes  ? ?Conditions/risks identified: None ? ?Next appointment: Follow up in one year for your annual wellness visit  ? ? ?Preventive Care 73 Years and Older, Female ?Preventive care refers to lifestyle choices and visits with your health care provider that can promote health and wellness. ?What does preventive care include? ?A yearly physical exam. This is also called an annual well check. ?Dental exams once or twice a year. ?Routine eye exams. Ask your health care provider how often you should have your eyes checked. ?Personal lifestyle choices, including: ?Daily care of your teeth and gums. ?Regular physical activity. ?Eating a healthy diet. ?Avoiding tobacco and drug use. ?Limiting alcohol use. ?Practicing safe sex. ?Taking low-dose aspirin every day. ?Taking vitamin and mineral supplements as recommended by your health care provider. ?What happens during an annual well check? ?The services and screenings done by your health care provider during your annual well check will depend on your age, overall health, lifestyle risk factors, and family history of disease. ?Counseling  ?Your health care provider may ask you questions about your: ?Alcohol use. ?Tobacco use. ?Drug use. ?Emotional well-being. ?Home and relationship  well-being. ?Sexual activity. ?Eating habits. ?History of falls. ?Memory and ability to understand (cognition). ?Work and work Statistician. ?Reproductive health. ?Screening  ?You may have the following tests or measurements: ?Height, weight, and BMI. ?Blood pressure. ?Lipid and cholesterol levels. These may be checked every 5 years, or more frequently if you are over 53 years old. ?Skin check. ?Lung cancer screening. You may have this screening every year starting at age 73 if you have a 30-pack-year history of smoking and currently smoke or have quit within the past 15 years. ?Fecal occult blood test (FOBT) of the stool. You may have this test every year starting at age 24. ?Flexible sigmoidoscopy or colonoscopy. You may have a sigmoidoscopy every 5 years or a colonoscopy every 10 years starting at age 43. ?Hepatitis C blood test. ?Hepatitis B blood test. ?Sexually transmitted disease (STD) testing. ?Diabetes screening. This is done by checking your blood sugar (glucose) after you have not eaten for a while (fasting). You may have this done every 1-3 years. ?Bone density scan. This is done to screen for osteoporosis. You may have this done starting at age 61. ?Mammogram. This may be done every 1-2 years. Talk to your health care provider about how often you should have regular mammograms. ?Talk with your health care provider about your test results, treatment options, and if necessary, the need for more tests. ?Vaccines  ?Your health care provider may recommend certain vaccines, such as: ?Influenza vaccine. This is recommended every year. ?Tetanus, diphtheria, and acellular pertussis (Tdap, Td) vaccine. You may need a Td booster every 10 years. ?Zoster vaccine. You may need this after age 31. ?Pneumococcal 13-valent conjugate (PCV13) vaccine. One dose is recommended after age 1. ?Pneumococcal polysaccharide (PPSV23) vaccine. One dose is recommended after age 29. ?Talk to your health care provider about which  screenings and vaccines you need and how often you need them. ?This information is not intended to replace advice given to you by your health care provider. Make sure you discuss any questions you have with your health care provider. ?Document Released: 10/16/2015 Document Revised: 06/08/2016 Document Reviewed: 07/21/2015 ?Elsevier Interactive Patient Education ? 2017 [place]. ? ?Fall Prevention in the Home ?Falls can cause injuries. They can happen to people of all ages. There are many things you can do to make your home safe and to help prevent  falls. ?What can I do on the outside of my home? ?Regularly fix the edges of walkways and driveways and fix any cracks. ?Remove anything that might make you trip as you walk through a door, such as a raised step or threshold. ?Trim any bushes or trees on the path to your home. ?Use bright outdoor lighting. ?Clear any walking paths of anything that might make someone trip, such as rocks or tools. ?Regularly check to see if handrails are loose or broken. Make sure that both sides of any steps have handrails. ?Any raised decks and porches should have guardrails on the edges. ?Have any leaves, snow, or ice cleared regularly. ?Use sand or salt on walking paths during winter. ?Clean up any spills in your garage right away. This includes oil or grease spills. ?What can I do in the bathroom? ?Use night lights. ?Install grab bars by the toilet and in the tub and shower. Do not use towel bars as grab bars. ?Use non-skid mats or decals in the tub or shower. ?If you need to sit down in the shower, use a plastic, non-slip stool. ?Keep the floor dry. Clean up any water that spills on the floor as soon as it happens. ?Remove soap buildup in the tub or shower regularly. ?Attach bath mats securely with double-sided non-slip rug tape. ?Do not have throw rugs and other things on the floor that can make you trip. ?What can I do in the bedroom? ?Use night lights. ?Make sure that you have a  light by your bed that is easy to reach. ?Do not use any sheets or blankets that are too big for your bed. They should not hang down onto the floor. ?Have a firm chair that has side arms. You can use this for support while y

## 2021-12-31 DIAGNOSIS — E1129 Type 2 diabetes mellitus with other diabetic kidney complication: Secondary | ICD-10-CM

## 2021-12-31 DIAGNOSIS — E1159 Type 2 diabetes mellitus with other circulatory complications: Secondary | ICD-10-CM

## 2021-12-31 DIAGNOSIS — I152 Hypertension secondary to endocrine disorders: Secondary | ICD-10-CM | POA: Diagnosis not present

## 2021-12-31 DIAGNOSIS — E785 Hyperlipidemia, unspecified: Secondary | ICD-10-CM

## 2021-12-31 DIAGNOSIS — N183 Chronic kidney disease, stage 3 unspecified: Secondary | ICD-10-CM

## 2021-12-31 DIAGNOSIS — Z794 Long term (current) use of insulin: Secondary | ICD-10-CM

## 2022-01-03 NOTE — Telephone Encounter (Signed)
Spoke with patient about follow up from CCM visit for getting patient's medications pre-packaged. Per her insurance, it would be covered. Made patient aware and she wants to try packaging. Will call her back on Wednesday at 1:30 pm to get # of tablets on hand for all of her medications. ?

## 2022-01-04 NOTE — Chronic Care Management (AMB) (Signed)
?  Care Management  ? ?Note ? ?01/04/2022 ?Name: Pamela Patton MRN: [id number] DOB: 09/03/1946 ? ?Pamela Patton is a 76 y.o. year old female who is a primary care patient of Koberlein, Steele Berg, MD and is actively engaged with the care management team. I reached out to [name] by phone today to assist with re-scheduling a follow up visit with the Licensed Clinical Social Worker ? ?Follow up plan: ?Telephone appointment with care management team member scheduled for:01/20/22 ? ?Laverda Sorenson  ?Care Guide, Embedded Care Coordination ?[place]  Care Management  ?Direct Dial: [phone] ? ?

## 2022-01-06 ENCOUNTER — Telehealth: Payer: Medicare PPO

## 2022-01-10 ENCOUNTER — Ambulatory Visit (INDEPENDENT_AMBULATORY_CARE_PROVIDER_SITE_OTHER): Payer: Medicare PPO

## 2022-01-10 DIAGNOSIS — Z794 Long term (current) use of insulin: Secondary | ICD-10-CM

## 2022-01-10 DIAGNOSIS — G20 Parkinson's disease: Secondary | ICD-10-CM

## 2022-01-10 DIAGNOSIS — I779 Disorder of arteries and arterioles, unspecified: Secondary | ICD-10-CM

## 2022-01-10 DIAGNOSIS — I152 Hypertension secondary to endocrine disorders: Secondary | ICD-10-CM

## 2022-01-10 DIAGNOSIS — N183 Chronic kidney disease, stage 3 unspecified: Secondary | ICD-10-CM

## 2022-01-10 NOTE — Patient Instructions (Signed)
Visit Information ? ?Thank you for taking time to visit with me today. Please don't hesitate to contact me if I can be of assistance to you before our next scheduled telephone appointment. ? ?Following are the goals we discussed today:  ?Take all medications as prescribed ?Attend all scheduled provider appointments ?Call pharmacy for medication refills 3-7 days in advance of running out of medications ?Attend church or other social activities ?Call provider office for new concerns or questions  ?Work with the Education officer, museum to address care coordination needs and will continue to work with the clinical team to address health care and disease management related needs ?keep appointment with eye doctor ?check blood sugar at prescribed times: before meals and at bedtime and when you have symptoms of low or high blood sugar ?take the blood sugar log to all doctor visits ?drink 6 to 8 glasses of water each day ?fill half of plate with vegetables ?switch to sugar-free drinks ?Take Lantus insulin in the morning ?check blood pressure weekly ?choose a place to take my blood pressure (home, clinic or office, retail store) ?take blood pressure log to all doctor appointments ?keep all doctor appointments ?begin an exercise program ?eat more whole grains, fruits and vegetables, lean meats and healthy fats ?limit salt intake to '2300mg'$ /day ?call for medicine refill 2 or 3 days before it runs out ?take all medications exactly as prescribed ?call doctor with any symptoms you believe are related to your medicine ? ?Our next appointment is by telephone on 03/07/22 at 10:45 AM ? ?Please call the care guide team at [phone] if you need to cancel or reschedule your appointment.  ? ?If you are experiencing a Mental Health or [place] or need someone to talk to, please call the Suicide and Crisis Lifeline: 988 ?call the [place] National Suicide Prevention Lifeline: 830 730 9294 or TTY: 351 180 5446 TTY [phone]) to talk  to a trained counselor ?call 1-800-273-TALK (toll free, 24 hour hotline) ?go to [facility] [address], [place] [phone]) ?call 911  ? ?Patient verbalizes understanding of instructions and care plan provided today and agrees to view in [place]. Active MyChart status confirmed with patient.   ? ?Peter Garter RN, BSN,CCM, CDE ?Care Management Coordinator ?[facility] ?[phone]   ?

## 2022-01-10 NOTE — Chronic Care Management (AMB) (Signed)
?Chronic Care Management  ? ?CCM RN Visit Note ? ?01/10/2022 ?Name: Pamela Patton MRN: [id number] DOB: July 25, 1946 ? ?Subjective: ?Pamela Patton is a 76 y.o. year old female who is a primary care patient of Koberlein, Steele Berg, MD. The care management team was consulted for assistance with disease management and care coordination needs.   ? ?Engaged with patient by telephone for follow up visit in response to provider referral for case management and/or care coordination services.  ? ?Consent to Services:  ?The patient was given information about Chronic Care Management services, agreed to services, and gave verbal consent prior to initiation of services.  Please see initial visit note for detailed documentation.  ? ?Patient agreed to services and verbal consent obtained.  ? ?Assessment: Review of patient past medical history, allergies, medications, health status, including review of consultants reports, laboratory and other test data, was performed as part of comprehensive evaluation and provision of chronic care management services.  ? ?SDOH (Social Determinants of Health) assessments and interventions performed:   ? ?CCM Care Plan ? ?No Known Allergies ? ?Outpatient Encounter Medications as of 01/10/2022  ?Medication Sig Note  ? aspirin 81 MG tablet Take 81 mg by mouth daily with lunch.    ? atorvastatin (LIPITOR) 40 MG tablet Take 40 mg by mouth daily with lunch.    ? Blood Pressure Monitoring (BLOOD PRESSURE CUFF) MISC 1 Product by Does not apply route as needed.   ? Calcium Carbonate-Vitamin D 600-400 MG-UNIT tablet Take 1 tablet by mouth 3 (three) times a week.    ? carbidopa-levodopa (SINEMET IR) 25-100 MG tablet Take 2 tablets by mouth 3 (three) times daily. 8am/noon/4pm.   ? Cholecalciferol (VITAMIN D PO) Take 5,000 Units by mouth 3 (three) times a week.    ? Cyanocobalamin (VITAMIN B12 PO) Take 5,000 mcg by mouth 2 (two) times a week.   ? glucose 4 GM chewable tablet Chew 1 tablet by mouth as needed for low  blood sugar. 06/30/2020: As needed   ? glucose blood (ACCU-CHEK AVIVA PLUS) test strip Use to test blood sugar 4 times a day (Dx: E11.65)   ? HUMALOG KWIKPEN 100 UNIT/ML KiwkPen Inject 0-4 Units into the skin 3 (three) times daily with meals. Sliding scale per Dr Altheimer's office notes from 09/24/2019   ? hydrochlorothiazide (HYDRODIURIL) 25 MG tablet Take 25 mg by mouth daily with lunch.    ? insulin glargine (LANTUS) 100 UNIT/ML injection Inject 0.05 mLs (5 Units total) into the skin every evening. (Patient taking differently: Inject 5 Units into the skin daily.)   ? Insulin Syringe-Needle U-100 (INSULIN SYRINGE .3CC/31GX5/16") 31G X 5/16" 0.3 ML MISC Use as directed for Lantus injection once a day   ? levothyroxine (SYNTHROID, LEVOTHROID) 75 MCG tablet Take 75 mcg by mouth daily. 12/15/2021: Patient not taking 1 day a week (Sunday) ?  ? losartan (COZAAR) 50 MG tablet Take 1 tablet (50 mg total) by mouth daily.   ? NONFORMULARY OR COMPOUNDED ITEM [facility]:  Antifungal - Terbinafine 3%, Fluconazole 2%, Tea Tree Oil 5%, Urea 10%, Ibuprofen 2% in DMSO Suspension #1m. Apply to the affected nail(s) once (at bedtime) or twice daily.   ? Nutritional Supplements (FEEDING SUPPLEMENT, GLUCERNA 1.2 CAL,) LIQD Take 237 mLs by mouth daily as needed. 06/30/2020: In place of meal when out at time  ? OneTouch Delica Lancets 302HMISC Use to test blood sugar 4 times a day (Dx: E11.65)   ? Pioglitazone HCl (ACTOS PO) Take  45 mg by mouth daily with lunch.    ? Polyethyl Glycol-Propyl Glycol (SYSTANE) 0.4-0.3 % GEL ophthalmic gel Place 1 application into both eyes at bedtime as needed (FOR EYES).   ? ?Facility-Administered Encounter Medications as of 01/10/2022  ?Medication  ? 0.9 %  sodium chloride infusion  ? ? ?Patient Active Problem List  ? Diagnosis Date Noted  ? 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 Carotid artery disease ([place]) 03/21/2017  ? Hypertension associated with  diabetes ([place]) 02/22/2017  ? Hyperlipidemia associated with type 2 diabetes mellitus ([place]) 02/22/2017  ? Hyperuricemia 10/31/2016  ? PD (Parkinson's disease) ([place]) 08/08/2016  ? Loss of weight 07/25/2016  ? DM (diabetes mellitus), type 2 with renal complications 42/70/6237  ? CKD (chronic kidney disease) stage 3, GFR 30-59 ml/min (HCC) 10/08/2012  ? Vitamin D deficiency 08/24/2012  ? Hypothyroid, s/p radioactive iodine tx for graves disease   ? ? ?Conditions to be addressed/monitored:CAD, HTN, HLD, DMII, and Parkinson's Disease ? ?Care Plan : RN Care Manager Plan of Care  ?Updates made by Dimitri Ped, RN since 01/10/2022 12:00 AM  ?  ? ?Problem: Chronic Disease Management and Care Coordination Needs (DM, Parkinsons Disease, CAD,CKD3, HTN ,HLD)   ?Priority: High  ?  ? ?Long-Range Goal: Establish Plan of Care for Chronic Disease Management Needs(DM, Parkinson's Disease, CAD,CKD3, HTN ,HLD)   ?Start Date: 11/15/2021  ?Expected End Date: 11/15/2022  ?Priority: High  ?Note:   ?Current Barriers:  ?Knowledge Deficits related to plan of care for management of CAD, HTN, HLD, DMII, CKD Stage 3, and Parkinson's Disease  ?Care Coordination needs related to Limited social support, Transportation, and ADL IADL limitations ?Chronic Disease Management support and education needs related to CAD, HTN, HLD, DMII, CKD Stage 3, and Parkinson's Disease   ?Lacks caregiver support ?Transportation barriers  ?States that her Parkinson's disease makes her slower. States she has more fatigue from being more active with her church's [employer] Week activities. States she still spends a lot of time keeping up with her medications and insulin.  States she has been doing better taking her pills on time.  States has tried taking her Lantus in the morning but she still has missed taking sometimes.  States she checks her CBG 3-4 times a day and doses her insulin depending on her blood sugars.  States her CBGs in the morning have ranged 114-130 and  140-280 later in the day.   Denies any recent falls. States she tries to eat healthy. States her B/P was normal when she last checked. Denies any chest pains or shortness of breath. ? ?RNCM Clinical Goal(s):  ?Patient will verbalize understanding of plan for management of CAD, HTN, HLD, DMII, CKD Stage 3, and Parkinson's Disease  as evidenced by voiced adherence to plan of care ?verbalize basic understanding of  CAD, HTN, HLD, DMII, CKD Stage 3, and Parkinson's Disease  disease process and self health management plan as evidenced by voiced understanding and teach back ?take all medications exactly as prescribed and will call provider for medication related questions as evidenced by dispense report and pt verbalization ?attend all scheduled medical appointments: CCM LCSW 01/20/22, Endocrinology 01/28/22, Annual Wellness Visit 01/02/23 as evidenced by medical records ?demonstrate Improved adherence to prescribed treatment plan for CAD, HTN, HLD, DMII, CKD Stage 3, and Parkinson's Disease  as evidenced by readings within limits, voiced adherence to plan of care ?continue to work with RN Care Manager to address care management and  care coordination needs related to  CAD, HTN, HLD, DMII, CKD Stage 3, and Parkinson's Disease  as evidenced by adherence to CM Team Scheduled appointments ?work with pharmacist to address polypharmacy and adherence issues related toCAD, HTN, HLD, DMII, CKD Stage 3, and Parkinson's Disease  as evidenced by review or EMR and patient or pharmacist report ?work with Education officer, museum to address  related to the management of Limited social support, Transportation, Level of care concerns, and ADL IADL limitations related to the management of CAD, HTN, HLD, DMII, CKD Stage 3, and Parkinson's Disease  as evidenced by review of EMR and patient or Education officer, museum report through collaboration with Consulting civil engineer, provider, and care team.  ? ?Interventions: ?1:1 collaboration with primary care provider regarding  development and update of comprehensive plan of care as evidenced by provider attestation and co-signature ?Inter-disciplinary care team collaboration (see longitudinal plan of care) ?Evaluation of current t

## 2022-01-11 ENCOUNTER — Telehealth: Payer: Self-pay | Admitting: Pharmacist

## 2022-01-11 NOTE — Chronic Care Management (AMB) (Addendum)
? ? ?Chronic Care Management ?Pharmacy Assistant  ? ?Name: Pamela Patton  MRN: [id number] DOB: 1946-09-18 ? ?Reason for Encounter: Onboarding process ? ?Recent office visits:  ?None ? ?Recent consult visits:  ?None ? ?Hospital visits:  ?None ? ?Medications: ?Outpatient Encounter Medications as of 01/11/2022  ?Medication Sig Note  ? aspirin 81 MG tablet Take 81 mg by mouth daily with lunch.    ? atorvastatin (LIPITOR) 40 MG tablet Take 40 mg by mouth daily with lunch.    ? Blood Pressure Monitoring (BLOOD PRESSURE CUFF) MISC 1 Product by Does not apply route as needed.   ? Calcium Carbonate-Vitamin D 600-400 MG-UNIT tablet Take 1 tablet by mouth 3 (three) times a week.    ? carbidopa-levodopa (SINEMET IR) 25-100 MG tablet Take 2 tablets by mouth 3 (three) times daily. 8am/noon/4pm.   ? Cholecalciferol (VITAMIN D PO) Take 5,000 Units by mouth 3 (three) times a week.    ? Cyanocobalamin (VITAMIN B12 PO) Take 5,000 mcg by mouth 2 (two) times a week.   ? glucose 4 GM chewable tablet Chew 1 tablet by mouth as needed for low blood sugar. 06/30/2020: As needed   ? glucose blood (ACCU-CHEK AVIVA PLUS) test strip Use to test blood sugar 4 times a day (Dx: E11.65)   ? HUMALOG KWIKPEN 100 UNIT/ML KiwkPen Inject 0-4 Units into the skin 3 (three) times daily with meals. Sliding scale per Dr Altheimer's office notes from 09/24/2019   ? hydrochlorothiazide (HYDRODIURIL) 25 MG tablet Take 25 mg by mouth daily with lunch.    ? insulin glargine (LANTUS) 100 UNIT/ML injection Inject 0.05 mLs (5 Units total) into the skin every evening. (Patient taking differently: Inject 5 Units into the skin daily.)   ? Insulin Syringe-Needle U-100 (INSULIN SYRINGE .3CC/31GX5/16") 31G X 5/16" 0.3 ML MISC Use as directed for Lantus injection once a day   ? levothyroxine (SYNTHROID, LEVOTHROID) 75 MCG tablet Take 75 mcg by mouth daily. 12/15/2021: Patient not taking 1 day a week (Sunday) ?  ? losartan (COZAAR) 50 MG tablet Take 1 tablet (50 mg total) by  mouth daily.   ? NONFORMULARY OR COMPOUNDED ITEM [facility]:  Antifungal - Terbinafine 3%, Fluconazole 2%, Tea Tree Oil 5%, Urea 10%, Ibuprofen 2% in DMSO Suspension #47m. Apply to the affected nail(s) once (at bedtime) or twice daily.   ? Nutritional Supplements (FEEDING SUPPLEMENT, GLUCERNA 1.2 CAL,) LIQD Take 237 mLs by mouth daily as needed. 06/30/2020: In place of meal when out at time  ? OneTouch Delica Lancets 309GMISC Use to test blood sugar 4 times a day (Dx: E11.65)   ? Pioglitazone HCl (ACTOS PO) Take 45 mg by mouth daily with lunch.    ? Polyethyl Glycol-Propyl Glycol (SYSTANE) 0.4-0.3 % GEL ophthalmic gel Place 1 application into both eyes at bedtime as needed (FOR EYES).   ? ?Facility-Administered Encounter Medications as of 01/11/2022  ?Medication  ? 0.9 %  sodium chloride infusion  ?  ?Referred by: KCaren Macadam MD  ?Reason for referral: Chronic care management ? ?Reviewed chart for medication changes ahead of medication coordination call. ? ?BP Readings from Last 3 Encounters:  ?11/01/21 136/78  ?08/13/21 118/60  ?05/04/21 110/66  ?  ?Lab Results  ?Component Value Date  ? HGBA1C 8.1 (H) 09/11/2018  ?  ? ?Verbal consent obtained for [facility] enhanced pharmacy services (medication synchronization, adherence packaging, delivery coordination). A medication sync plan was created to allow patient to get all medications delivered once every  30 to 90 days per patient preference. Patient understands they have freedom to choose pharmacy and clinical pharmacist will coordinate care between all prescribers and [facility]. ? ?Patient requested to obtain medications through Adherence Packaging  30 Days  ? ?Med Sync Plan: ?Aspirin 81 mg X      1   #65 March 13, 2022  ?Atorvastatin   [place]    1   #90 April 07, 2022  ?Carbidopa-levodopa 25/100 mg  [name], DO   '2 2  2  '$ #300 Feb 26, 2022  ?Humalog Kwikpen 100 units per ml - Inject 0-4 Units into the skin 3 (three) times  daily with meals. Sliding scale  Lorne Skeens, MD       #7 pens   ?Hydrochlorothiazide 25 mg [place]    1   #595 July 21, 2022  ?Lantus 100un/ml - Inject 0.05 mLs (5 Units total) into the skin every evening Lorne Skeens, MD       #4 vials   ?Levothyroxine 75 mcg [place] 1      #63 March 23, 2022  ?Losartan '50mg'$  X      1   #62 March 10, 2022  ?Pioglitazone 45 mg  [place]    1   #875 April 22, 2022  ?Accu-chek aviva test strips -  7 boxes Lorne Skeens, MD       #7 boxes   ? ? ?Patient will need a short fill prior to adherence delivery: ? ?Prescriptions requested from [facility] [place] [facility]. Reviewed medications that will need to be transferred, all medications will be transferred by fax from [facility].   ? ? ?Care Gaps: ?Last AWV - scheduled 01/02/2023 ?Last BP - 136/78 on 11/01/2021 ?Last A1C -10.1 on 10/28/2021 ?Foot Exam - overdue ?Colonoscopy - overdue ?  ?Star Rating Drugs: ?Atorvastatin 40 mg  - last filled [facility]/21/2021 [facility] ?Losartan 25 mg - last filled [facility]/08/2021 [facility] ?Actos 45 mg - last filled 10/21/2021 [facility] on [facility] ? ?Gennie Alma CMA  ?Clinical Pharmacist Assistant ?[phone] ? ?

## 2022-01-20 ENCOUNTER — Ambulatory Visit: Payer: Medicare PPO | Admitting: Licensed Clinical Social Worker

## 2022-01-20 DIAGNOSIS — G20 Parkinson's disease: Secondary | ICD-10-CM

## 2022-01-20 DIAGNOSIS — N183 Chronic kidney disease, stage 3 unspecified: Secondary | ICD-10-CM

## 2022-01-20 DIAGNOSIS — Z794 Long term (current) use of insulin: Secondary | ICD-10-CM

## 2022-01-20 DIAGNOSIS — G20A1 Parkinson's disease without dyskinesia, without mention of fluctuations: Secondary | ICD-10-CM

## 2022-01-20 DIAGNOSIS — I152 Hypertension secondary to endocrine disorders: Secondary | ICD-10-CM

## 2022-01-20 DIAGNOSIS — E1129 Type 2 diabetes mellitus with other diabetic kidney complication: Secondary | ICD-10-CM

## 2022-01-20 DIAGNOSIS — E1159 Type 2 diabetes mellitus with other circulatory complications: Secondary | ICD-10-CM

## 2022-01-20 NOTE — Patient Instructions (Signed)
Visit Information ? ?Thank you for taking time to visit with me today. Please don't hesitate to contact me if I can be of assistance to you before our next scheduled telephone appointment. ? ?Following are the goals we discussed today:  ?Patient Goals/Self-Care Activities: Over the next 120 days ?Attend all scheduled medical appointments ?Utilize healthy coping skills and/or supportive resources discussed ?Contact PCP office with any questions or concerns ? ?Our next appointment is by telephone on 03/17/22 at 10:45 AM ? ?Please call the care guide team at [phone] if you need to cancel or reschedule your appointment.  ? ?If you are experiencing a Mental Health or [place] or need someone to talk to, please call the Suicide and Crisis Lifeline: 988  ? ?Patient verbalizes understanding of instructions and care plan provided today and agrees to view in [place]. Active MyChart status confirmed with patient.   ? ?Christa See, MSW, LCSW ?[facility] ?[facility] ?[email or web address] ?Phone [phone] ?12:42 PM ?  ?

## 2022-01-20 NOTE — Chronic Care Management (AMB) (Signed)
?Chronic Care Management  ? ? Clinical Social Work Note ? ?01/20/2022 ?Name: Pamela Patton MRN: [id number] DOB: 1946/02/03 ? ?Pamela Patton is a 76 y.o. year old female who is a primary care patient of Koberlein, Steele Berg, MD. The CCM team was consulted to assist the patient with chronic disease management and/or care coordination needs related to: Mental Health Counseling and Resources.  ? ?Engaged with patient by telephone for follow up visit in response to provider referral for social work chronic care management and care coordination services.  ? ?Consent to Services:  ?The patient was given information about Chronic Care Management services, agreed to services, and gave verbal consent prior to initiation of services.  Please see initial visit note for detailed documentation.  ? ?Patient agreed to services and consent obtained.  ? ?Assessment: Review of patient past medical history, allergies, medications, and health status, including review of relevant consultants reports was performed today as part of a comprehensive evaluation and provision of chronic care management and care coordination services.    ? ?SDOH (Social Determinants of Health) assessments and interventions performed:   ? ?Advanced Directives Status: Not addressed in this encounter. ? ?CCM Care Plan ? ?No Known Allergies ? ?Outpatient Encounter Medications as of 01/20/2022  ?Medication Sig Note  ? aspirin 81 MG tablet Take 81 mg by mouth daily with lunch.    ? atorvastatin (LIPITOR) 40 MG tablet Take 40 mg by mouth daily with lunch.    ? Blood Pressure Monitoring (BLOOD PRESSURE CUFF) MISC 1 Product by Does not apply route as needed.   ? Calcium Carbonate-Vitamin D 600-400 MG-UNIT tablet Take 1 tablet by mouth 3 (three) times a week.    ? carbidopa-levodopa (SINEMET IR) 25-100 MG tablet Take 2 tablets by mouth 3 (three) times daily. 8am/noon/4pm.   ? Cholecalciferol (VITAMIN D PO) Take 5,000 Units by mouth 3 (three) times a week.    ?  Cyanocobalamin (VITAMIN B12 PO) Take 5,000 mcg by mouth 2 (two) times a week.   ? glucose 4 GM chewable tablet Chew 1 tablet by mouth as needed for low blood sugar. 06/30/2020: As needed   ? glucose blood (ACCU-CHEK AVIVA PLUS) test strip Use to test blood sugar 4 times a day (Dx: E11.65)   ? HUMALOG KWIKPEN 100 UNIT/ML KiwkPen Inject 0-4 Units into the skin 3 (three) times daily with meals. Sliding scale per Dr Altheimer's office notes from 09/24/2019   ? hydrochlorothiazide (HYDRODIURIL) 25 MG tablet Take 25 mg by mouth daily with lunch.    ? insulin glargine (LANTUS) 100 UNIT/ML injection Inject 0.05 mLs (5 Units total) into the skin every evening. (Patient taking differently: Inject 5 Units into the skin daily.)   ? Insulin Syringe-Needle U-100 (INSULIN SYRINGE .3CC/31GX5/16") 31G X 5/16" 0.3 ML MISC Use as directed for Lantus injection once a day   ? levothyroxine (SYNTHROID, LEVOTHROID) 75 MCG tablet Take 75 mcg by mouth daily. 12/15/2021: Patient not taking 1 day a week (Sunday) ?  ? losartan (COZAAR) 50 MG tablet Take 1 tablet (50 mg total) by mouth daily.   ? NONFORMULARY OR COMPOUNDED ITEM [facility]:  Antifungal - Terbinafine 3%, Fluconazole 2%, Tea Tree Oil 5%, Urea 10%, Ibuprofen 2% in DMSO Suspension #8m. Apply to the affected nail(s) once (at bedtime) or twice daily.   ? Nutritional Supplements (FEEDING SUPPLEMENT, GLUCERNA 1.2 CAL,) LIQD Take 237 mLs by mouth daily as needed. 06/30/2020: In place of meal when out at time  ? OneTouch  Delica Lancets 76P MISC Use to test blood sugar 4 times a day (Dx: E11.65)   ? Pioglitazone HCl (ACTOS PO) Take 45 mg by mouth daily with lunch.    ? Polyethyl Glycol-Propyl Glycol (SYSTANE) 0.4-0.3 % GEL ophthalmic gel Place 1 application into both eyes at bedtime as needed (FOR EYES).   ? ?Facility-Administered Encounter Medications as of 01/20/2022  ?Medication  ? 0.9 %  sodium chloride infusion  ? ? ?Patient Active Problem List  ? Diagnosis Date Noted  ? Mild  neurocognitive disorder 03/18/2020  ? 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 Carotid artery disease ([place]) 03/21/2017  ? Hypertension associated with diabetes ([place]) 02/22/2017  ? Hyperlipidemia associated with type 2 diabetes mellitus ([place]) 02/22/2017  ? Hyperuricemia 10/31/2016  ? PD (Parkinson's disease) ([place]) 08/08/2016  ? Loss of weight 07/25/2016  ? DM (diabetes mellitus), type 2 with renal complications 95/06/3266  ? CKD (chronic kidney disease) stage 3, GFR 30-59 ml/min (HCC) 10/08/2012  ? Vitamin D deficiency 08/24/2012  ? Hypothyroid, s/p radioactive iodine tx for graves disease   ? ? ?Conditions to be addressed/monitored: HTN, DMII, CKD Stage 3, and Parkinson's Disease ? ?Care Plan : LCSW Plan of Care  ?Updates made by Rebekah [place], LCSW since 01/20/2022 12:00 AM  ?  ? ?Problem: Quality of Life (General Plan of Care)   ?  ? ?Long-Range Goal: Quality of Life Maintained   ?Start Date: 11/18/2021  ?Expected End Date: 05/02/2022  ?This Visit's Progress: On track  ?Recent Progress: On track  ?Priority: Medium  ?Note:   ?Current barriers:   ?Acute Mental Health needs related to Stress ?Needs Support, Education, and Care Coordination in order to manage psychosocial stressors ?Clinical Goal(s): explore community resource options for unmet needs related to:  Stress  ?Clinical Interventions:  ?Assessed patient's previous and current treatment, coping skills, support system and barriers to care  ?Patient endorses difficulty adjusting with lifestyle changes after the pandemic. Patient was successful in identifying stressors and healthy coping skills to assist with management of stress ?Patient reports having good and bad days. States she has identified tasks that bring her motivation and energy ?CCM LCSW provided validation and encouragement. Self-care strategies identified to promote relaxation and positive mood ?Patient has improved on checking blood sugars and managing health conditions ?Patient receives strong  support from family and friends through church ?Mindfulness or Relaxation training provided ?Active listening / Reflection utilized  ?Emotional Support Provided ?Problem Solving /Task Center strategies reviewed ?Provided psychoeducation for mental health needs  ?Verbalization of feelings encouraged   ?1:1 collaboration with primary care provider regarding development and update of comprehensive plan of care as evidenced by provider attestation and co-signature ?Inter-disciplinary care team collaboration (see longitudinal plan of care) ?Patient Goals/Self-Care Activities: Over the next 120 days ?Attend all scheduled medical appointments ?Utilize healthy coping skills and/or supportive resources discussed ?Contact PCP office with any questions or concerns ? ?  ?  ? ?Follow Up Plan: Appointment scheduled for SW follow up with client by phone on: 03/17/22 ? ?Christa See, MSW, LCSW ?[facility] ?[facility] ?[email or web address] ?Phone [phone] ?12:47 PM ? ? ? ?

## 2022-01-26 DIAGNOSIS — Z794 Long term (current) use of insulin: Secondary | ICD-10-CM | POA: Diagnosis not present

## 2022-01-26 DIAGNOSIS — E1165 Type 2 diabetes mellitus with hyperglycemia: Secondary | ICD-10-CM | POA: Diagnosis not present

## 2022-01-26 DIAGNOSIS — E7800 Pure hypercholesterolemia, unspecified: Secondary | ICD-10-CM | POA: Diagnosis not present

## 2022-01-26 DIAGNOSIS — E559 Vitamin D deficiency, unspecified: Secondary | ICD-10-CM | POA: Diagnosis not present

## 2022-01-26 DIAGNOSIS — E890 Postprocedural hypothyroidism: Secondary | ICD-10-CM | POA: Diagnosis not present

## 2022-01-28 DIAGNOSIS — E113293 Type 2 diabetes mellitus with mild nonproliferative diabetic retinopathy without macular edema, bilateral: Secondary | ICD-10-CM | POA: Diagnosis not present

## 2022-01-28 DIAGNOSIS — E890 Postprocedural hypothyroidism: Secondary | ICD-10-CM | POA: Diagnosis not present

## 2022-01-28 DIAGNOSIS — E1122 Type 2 diabetes mellitus with diabetic chronic kidney disease: Secondary | ICD-10-CM | POA: Diagnosis not present

## 2022-01-28 DIAGNOSIS — I129 Hypertensive chronic kidney disease with stage 1 through stage 4 chronic kidney disease, or unspecified chronic kidney disease: Secondary | ICD-10-CM | POA: Diagnosis not present

## 2022-01-28 DIAGNOSIS — E1169 Type 2 diabetes mellitus with other specified complication: Secondary | ICD-10-CM | POA: Diagnosis not present

## 2022-01-28 DIAGNOSIS — E785 Hyperlipidemia, unspecified: Secondary | ICD-10-CM | POA: Diagnosis not present

## 2022-01-28 DIAGNOSIS — L84 Corns and callosities: Secondary | ICD-10-CM | POA: Diagnosis not present

## 2022-01-28 DIAGNOSIS — E1165 Type 2 diabetes mellitus with hyperglycemia: Secondary | ICD-10-CM | POA: Diagnosis not present

## 2022-01-28 DIAGNOSIS — N1832 Chronic kidney disease, stage 3b: Secondary | ICD-10-CM | POA: Diagnosis not present

## 2022-01-28 LAB — PROTEIN / CREATININE RATIO, URINE: Albumin, U: 18

## 2022-01-30 DIAGNOSIS — E1159 Type 2 diabetes mellitus with other circulatory complications: Secondary | ICD-10-CM

## 2022-01-30 DIAGNOSIS — E1129 Type 2 diabetes mellitus with other diabetic kidney complication: Secondary | ICD-10-CM

## 2022-01-30 DIAGNOSIS — Z794 Long term (current) use of insulin: Secondary | ICD-10-CM

## 2022-01-30 DIAGNOSIS — I152 Hypertension secondary to endocrine disorders: Secondary | ICD-10-CM

## 2022-01-30 DIAGNOSIS — N183 Chronic kidney disease, stage 3 unspecified: Secondary | ICD-10-CM | POA: Diagnosis not present

## 2022-02-15 ENCOUNTER — Ambulatory Visit
Admission: RE | Admit: 2022-02-15 | Discharge: 2022-02-15 | Disposition: A | Discharge status: Home / Self Care | Payer: Medicare PPO | Source: Ambulatory Visit | Attending: Family Medicine | Admitting: Family Medicine

## 2022-02-15 DIAGNOSIS — Z1231 Encounter for screening mammogram for malignant neoplasm of breast: Secondary | ICD-10-CM | POA: Diagnosis not present

## 2022-02-21 ENCOUNTER — Encounter: Payer: Self-pay | Admitting: Neurology

## 2022-02-21 ENCOUNTER — Ambulatory Visit: Payer: Medicare PPO | Admitting: Neurology

## 2022-02-21 VITALS — BP 138/58 | HR 72 | Ht 66.0 in | Wt 131.0 lb

## 2022-02-21 DIAGNOSIS — G20 Parkinson's disease: Secondary | ICD-10-CM

## 2022-02-21 MED ORDER — CARBIDOPA-LEVODOPA 25-100 MG PO TABS: 2.0000 | ORAL_TABLET | Freq: Three times a day (TID) | ORAL | 5 refills | Status: DC

## 2022-02-21 NOTE — Patient Instructions (Addendum)
As we discussed, it used to be thought that levodopa would increase risk of melanoma but now it is believed that Parkinsons itself likely increases risk of melanoma. I recommend yearly skin checks with a board certified dermatologist.  You can call [facility] or [facility] for an appointment.  [facility] Address: [address], [place], [place] [address] Phone: [phone]  [facility] [place], [place], [place] Phone: [phone]   Local and Online Resources for Power over Parkinson's Group May 2023  [place] over Parkinson's Group:   Power Over Parkinson's Patient Education Group will be Wednesday, May 10th-*Hybrid meting*- in person at [facility] location and via [facility] at 2:00 pm.   Upcoming Power over Parkinson's Meetings:  2nd Wednesdays of the month at 2 pm:  May 10th, June 14th, July 12th Contact Amy Marriott at [email or web address] if interested in participating in this group Parkinson's Care Partners Group:    3rd Mondays, Contact Misty Paladino Atypical Parkinsonian Patient Group:   4th Wednesdays, [place] If you are interested in participating in these groups with Misty, please contact her directly for how to join those meetings.  Her contact information is [email or web address].    LOCAL EVENTS AND NEW OFFERINGS Moving Day [place]:  Saturday, May 6th, 9:30 am at [place], [place], [place]. Participate in Moving Day as a way to "honor loved ones, raise funds, fight Parkinson's disease, and celebrate movement."  Register today at [email or web address]!  Play [place]!  Join [place] for home game for a fun evening to bring awareness of Parkinson's and raise funds for our Movement Disorder Funds. Rescheduled to May 11th  6:30 pm [place]. To  purchase tickets:  [email or web address] Parkinson's T-shirts for sale!  Designed by a local group member, with funds going to [place].  $25.00  Investment banker, corporate to purchase  [employer]! Moves [employer] Instructor-Led Class offering at [employer]!  Wednesdays 1-2 pm, starting April 12th.   Contact Bryson Dames, Acupuncturist at [employer].  [name]  [place]:  [email or web address] PD Health at Home continues:  Mindfulness Mondays, Wellness Wednesdays, Fitness Fridays  Upcoming Education:  Understanding Gene and Cell-Based Therapies in Parkinson's.  Wednesday, May 10th at 1:00 pm Additional Education offerings virtually through their website-upcoming topics include Palliative Care/Hospice and PD, Sleep and PD Register for expert briefings (webinars) at [email or web address] Please check out their website to sign up for emails and see their full online offerings   [place]:  [email or web address]  Third Thursday Webinars:  On the third Thursday of every month at 12 p.m. ET, join our free live webinars to learn about various aspects of living with Parkinson's disease and our work to speed medical breakthroughs. Upcoming Webinar: Get Moving: Exercising for a Healthy Brain.  Thursday, May 18th  at  12 noon. Check out additional information on their website to see their full online offerings  [facility]:  [email or web address] Upcoming Webinar:   Stay tuned Webinar Series:  Living with Parkinson's Meetup.   Third Thursdays each month, 3 pm Care Partner Monthly Meetup.  With Robin Searing [place]inney.  First Tuesday of each month, 2 pm Check out additional information to Live Well Today on their website  Parkinson and Movement Disorders (PMD) Alliance:   www.[email or web address] [employer] Online:  Online Education Events Sign up for emails, which are sent weekly to  give you updates on programming and online offerings  Parkinson's Association of the [place]:  www.[email or web address] Information on online support groups, education events, and online exercises including Yoga, Parkinson's exercises and more-LOTS of information on links to PD resources and online events Virtual Support Group through Parkinson's Association of the [place]; next one is scheduled for Wednesday, May 3rd at 2 pm. (These are typically scheduled for the 1st Wednesday of the month at 2 pm).  Visit website for details. MOVEMENT AND EXERCISE OPPORTUNITIES Parkinson's DRUMMING Classes/Music Therapy with Doylene Canning:  This is a returning class and it's FREE!  2nd Mondays, continuing May 8th, 11:00 at the [place].  Contact *Misty Taylor-Paladino at [email or web address] or Doylene Canning at [phone] or [email or web address]  PWR! Moves Classes at [place].  Wednesdays 10 and 11 am.   Contact Amy Marriott, PT [email or web address] if interested. NEW PWR! Moves Class offering at [employer].  Wednesdays 1-2 pm, starting April 12th.  Contact Bryson Dames, Acupuncturist at [employer].  [name] Here is a link to the PWR!Moves classes on Zoom from [place] - Daily Mon-Sat at 10:00. Via Zoom, FREE and open to all.  There is also a link below via Facebook if you use that platform.  [email or web address]  Parkinson's Wellness Recovery (PWR! Moves)  www.[email or web address] Info on the PWR! Virtual Experience:  You will have access to our expertise through  self-assessment, guided plans that start with the PD-specific fundamentals, educational content, tips, Q&A with an expert, and a growing Art therapist of PD-specific pre-recorded and live exercise classes of varying types and intensity - both physical and cognitive! If that is not enough, we offer 1:1 wellness consultations (in-person or virtual) to personalize your PWR! Research scientist (medical).  [place] Fridays:  As part of the PD Health @ Home program, this free video series focuses each week on one aspect of fitness designed to support people living with Parkinson's.  These weekly videos highlight the [place] recent fitness guidelines for people with Parkinson's disease. ModemGamers.si Dance for PD website is offering free, live-stream classes throughout the week, as well as links to [employer] of classes:  [email or web address]/ Virtual dance and Pilates for Parkinson's classes: Click on the Community Tab> Parkinson's Movement Initiative Tab.  To register for classes and for more information, visit [email or web address] and click the "community" tab.  [employer] Parkinson's Cycling Classes  [employer]:  Thursdays @ Noon-Live classes at [employer] ([employer] at [email or web address] or [phone]) [employer]: Virtual Classes Mondays and Thursdays Jeanette Caprice classes Tuesday, Wednesday and Thursday (contact [place] at [email or web address]  or [phone]) [place] Varied levels of classes are offered Mondays, Tuesdays and Thursdays at [employer].  Stretching with Verdis Frederickson weekly class is also offered for people with Parkinson's To observe a class or for more information, call [phone] or email Hezzie Bump at [email or web address] ADDITIONAL SUPPORT AND RESOURCES Well[employer]:Online Caregiver Education Opportunities:   www.well-[email or web address]/caregiver-education/[email or web address]-support-group.  You may also contact Vickki Muff at [email or web address] or [phone].    Well-Spring Navigator:  [phone] program, a free service to help individuals and families through the journey of determining care for older adults.  The "Navigator" is a [employer], Education officer, museum, who will speak with a prospective client and/or loved ones to provide an assessment of the situation and a set of recommendations for a personalized care plan -- all free of charge, and whether Well[employer]Spring [employer] offers the needed service or not. If the need  is not a service we provide, we are well-connected with reputable programs in town that we can refer you to.  www.well-[email or web address] or to speak with the Navigator, call [phone].

## 2022-02-21 NOTE — Progress Notes (Signed)
Assessment/Plan:   1.  Parkinsons Disease  -Continue carbidopa/levodopa 25/100, 2 tablets 3 times per day.  -We discussed that it used to be thought that levodopa would increase risk of melanoma but now it is believed that Parkinsons itself likely increases risk of melanoma. she is to get regular skin checks.  -referral for PT  2.  RBD             -Doing fairly well in that regard.  Does not want any medication. 3.  MCI             -Had neurocognitive testing with Pamela Patton in June, 2021.  Not having any memory change currently per report and no hallucinations or delusions  4.  Uncontrolled diabetes  -Following with endocrinology.  Current A1c 9.4 in April, 2023  Subjective:   Pamela Patton was seen today in follow up for Parkinsons disease.  My previous records were reviewed prior to todays visit as well as outside records available to me.  I have not seen the patient since last August.  She admits that she misses medication some (last filled for 6 months last august).  She is working on doing better with this.   She denies falls.  She denies near syncopal episodes.  He is following with endocrinology, last seen in April.  Those notes are reviewed.  Her A1c is pretty high at 9.4 (but improved compared to greater than 10 previously).    Current prescribed movement disorder medications: Carbidopa/levodopa 25/100, 2 tablets 3 times per day    PREVIOUS MEDICATIONS: mirtazapine (given but not taken)  ALLERGIES:  No Known Allergies  CURRENT MEDICATIONS:  Outpatient Encounter Medications as of 02/21/2022  Medication Sig   aspirin 81 MG tablet Take 81 mg by mouth daily with lunch.    atorvastatin (LIPITOR) 40 MG tablet Take 40 mg by mouth daily with lunch.    Blood Pressure Monitoring (BLOOD PRESSURE CUFF) MISC 1 Product by Does not apply route as needed.   Calcium Carbonate-Vitamin D 600-400 MG-UNIT tablet Take 1 tablet by mouth 3 (three) times a week.    carbidopa-levodopa (SINEMET  IR) 25-100 MG tablet Take 2 tablets by mouth 3 (three) times daily. 8am/noon/4pm.   Cholecalciferol (VITAMIN D PO) Take 5,000 Units by mouth 3 (three) times a week.    Cyanocobalamin (VITAMIN B12 PO) Take 5,000 mcg by mouth 2 (two) times a week.   glucose 4 GM chewable tablet Chew 1 tablet by mouth as needed for low blood sugar.   glucose blood (ACCU-CHEK AVIVA PLUS) test strip Use to test blood sugar 4 times a day (Dx: E11.65)   HUMALOG KWIKPEN 100 UNIT/ML KiwkPen Inject 0-4 Units into the skin 3 (three) times daily with meals. Sliding scale per Dr Altheimer's office notes from 09/24/2019   hydrochlorothiazide (HYDRODIURIL) 25 MG tablet Take 25 mg by mouth daily with lunch.    insulin glargine (LANTUS) 100 UNIT/ML injection Inject 0.05 mLs (5 Units total) into the skin every evening. (Patient taking differently: Inject 5 Units into the skin daily.)   Insulin Syringe-Needle U-100 (INSULIN SYRINGE .3CC/31GX5/16") 31G X 5/16" 0.3 ML MISC Use as directed for Lantus injection once a day   levothyroxine (SYNTHROID, LEVOTHROID) 75 MCG tablet Take 75 mcg by mouth daily.   losartan (COZAAR) 50 MG tablet Take 1 tablet (50 mg total) by mouth daily.   NONFORMULARY OR COMPOUNDED ITEM [facility]:  Antifungal - Terbinafine 3%, Fluconazole 2%, Tea Tree Oil 5%, Urea  10%, Ibuprofen 2% in DMSO Suspension #61m. Apply to the affected nail(s) once (at bedtime) or twice daily.   Nutritional Supplements (FEEDING SUPPLEMENT, GLUCERNA 1.2 CAL,) LIQD Take 237 mLs by mouth daily as needed.   OneTouch Delica Lancets 356CMISC Use to test blood sugar 4 times a day (Dx: E11.65)   Pioglitazone HCl (ACTOS PO) Take 45 mg by mouth daily with lunch.    Polyethyl Glycol-Propyl Glycol (SYSTANE) 0.4-0.3 % GEL ophthalmic gel Place 1 application into both eyes at bedtime as needed (FOR EYES).   Facility-Administered Encounter Medications as of 02/21/2022  Medication   0.9 %  sodium chloride infusion    Objective:   PHYSICAL  EXAMINATION:    VITALS:   Vitals:   02/21/22 1406  BP: (!) 138/58  Pulse: 72  SpO2: 98%  Weight: 131 lb (59.4 kg)  Height: '5\' 6"'$  (1.676 m)     GEN:  The patient appears stated age and is in NAD. HEENT:  Normocephalic, atraumatic.  The mucous membranes are moist.   Neurological examination:  Orientation: The patient is alert and oriented x3. Cranial nerves: There is good facial symmetry with mild facial hypomimia. The speech is fluent and clear but pseudobulbar, same as previous. Soft palate rises symmetrically and there is no tongue deviation. Hearing is intact to conversational tone. Sensation: Sensation is intact to light touch throughout Motor: Strength is at least antigravity x4.  Movement examination: Tone: There is mild increased tone in the UE bilaterally Abnormal movements: none Coordination:  There is decremation, mild, bilateral, UE and LE Gait and Station: Patient pushes off of the chair to arise.  The patient's stride length is slightly decreased with slight decrease arm swing on the L  I have reviewed and interpreted the following labs independently    Chemistry      Component Value Date/Time   NA 142 08/14/2020 2144   NA 142 04/27/2017 0000   K 4.3 08/14/2020 2144   CL 109 08/14/2020 2144   CO2 23 08/14/2020 2144   BUN 31 (H) 08/14/2020 2144   BUN 23 (A) 04/27/2017 0000   CREATININE 1.53 (H) 08/14/2020 2144   GLU 154 04/27/2017 0000      Component Value Date/Time   CALCIUM 9.4 08/14/2020 2144   ALKPHOS 73 08/11/2020 1116   AST 14 08/11/2020 1116   ALT 5 08/11/2020 1116   BILITOT 0.5 08/11/2020 1116       Lab Results  Component Value Date   WBC 9.5 08/14/2020   HGB 9.9 (L) 08/14/2020   HCT 32.2 (L) 08/14/2020   MCV 95.8 08/14/2020   PLT 272 08/14/2020    Lab Results  Component Value Date   TSH 0.30 (A) 04/27/2017     Total time spent on today's visit was 22 minutes, including both face-to-face time and nonface-to-face time.  Time  included that spent on review of records (prior notes available to me/labs/imaging if pertinent), discussing treatment and goals, answering patient's questions and coordinating care.  Cc:  KCaren Macadam MD

## 2022-02-25 ENCOUNTER — Ambulatory Visit: Payer: Self-pay | Admitting: Physical Therapy

## 2022-03-03 ENCOUNTER — Ambulatory Visit: Payer: Medicare PPO | Attending: Neurology | Admitting: Physical Therapy

## 2022-03-03 DIAGNOSIS — G20 Parkinson's disease: Secondary | ICD-10-CM | POA: Insufficient documentation

## 2022-03-03 DIAGNOSIS — R2681 Unsteadiness on feet: Secondary | ICD-10-CM | POA: Insufficient documentation

## 2022-03-03 DIAGNOSIS — M6281 Muscle weakness (generalized): Secondary | ICD-10-CM | POA: Insufficient documentation

## 2022-03-03 DIAGNOSIS — R2689 Other abnormalities of gait and mobility: Secondary | ICD-10-CM | POA: Insufficient documentation

## 2022-03-03 DIAGNOSIS — Z9181 History of falling: Secondary | ICD-10-CM | POA: Diagnosis present

## 2022-03-03 NOTE — Therapy (Signed)
OUTPATIENT PHYSICAL THERAPY NEURO EVALUATION   Patient Name: Pamela Patton MRN: [id number] DOB:1946-02-09, 76 y.o., female Today's Date: 03/03/2022   PCP: Caren Macadam, MD REFERRING PROVIDER: Ludwig Clarks, DO    PT End of Session - 03/03/22 1024     Visit Number 1    Number of Visits 17   Plus eval   Date for PT Re-Evaluation 05/26/22    Authorization Type [employer]    PT Start Time 1020    PT Stop Time 1058    PT Time Calculation (min) 38 min    Activity Tolerance Patient tolerated treatment well    Behavior During Therapy Anxious;Flat affect             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   Carotid artery disease 03/21/2017   Cataract    immature    Chest pain summer 2012   Chicken pox    CKD (chronic kidney disease) 10/08/2012   Stage 3, GFR 30-59 ml/min   Diverticulosis    difficulty with colonoscopy 2005   DM (diabetes mellitus), type 2 with renal complications 34/91/7915   Fracture    radial/ulnar 03/2016   GERD (gastroesophageal reflux disease)    Heart murmur    innocent murmur   High cholesterol    Hypertension associated with diabetes 02/22/2017   Hyperuricemia 10/31/2016   Hypothyroidism    s/p radioactive iodine tx for graves in 2001   Intestinal metaplasia of gastric mucosa    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   Parkinson's disease 08/08/2016   UTI (urinary tract infection)    Vitamin D deficiency 08/24/2012   Weight loss    eval with GI in 2016; s/p CT/MRI and PET scans   Past Surgical History:  Procedure Laterality Date   ABDOMINAL HYSTERECTOMY  1989   CATARACT EXTRACTION, BILATERAL     COLONOSCOPY     DG BARIUM SWALLOW ([place] [facility])     9 yrs ago per pt due to incomplete colonsocopy   Patient Active Problem List   Diagnosis Date Noted   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   Carotid artery disease ([place])  03/21/2017   Hypertension associated with diabetes ([place]) 02/22/2017   Hyperlipidemia associated with type 2 diabetes mellitus ([place]) 02/22/2017   Hyperuricemia 10/31/2016   PD (Parkinson's disease) ([place]) 08/08/2016   Loss of weight 07/25/2016   DM (diabetes mellitus), type 2 with renal complications 05/69/7948   CKD (chronic kidney disease) stage 3, GFR 30-59 ml/min ([place]) 10/08/2012   Vitamin D deficiency 08/24/2012   Hypothyroid, s/p radioactive iodine tx for graves disease     ONSET DATE: 02/21/2022   REFERRING DIAG: G20 (ICD-10-CM) - Parkinson's disease ([place])   THERAPY DIAG:  Other abnormalities of gait and mobility  Unsteadiness on feet  Muscle weakness (generalized)  History of falling  Rationale for Evaluation and Treatment Rehabilitation  SUBJECTIVE:  SUBJECTIVE STATEMENT: Pt returns to this clinic following bout of therapy in 2018. "A lot of things have changed". Pt states she is trying to clean out her house, "there is a lot of junk in my house". Pt has not been doing PWR moves regularly but has a pedal bike that she uses 5x/week. Does stretches occasionally, was walking but then had incident where she lost her balance while stepping off a curb and rolled down a hill multiple years ago. Had second incident where she fell and hurt her shoulder a few years ago, is very nervous to go out and walk now and has no where to walk that is not in the street. Pt very afraid of falling in the street.     Pt accompanied by: self  PERTINENT HISTORY: Diabetes mellitus type 2 with hyperglycemia, with long-term current use of insulin, stage 3b chronic kidney disease and hypertension, Diabetic mild nonproliferative retinopathy, both eyes, pressure callus, Dyslipidemia associated with type 2 diabetes  mellitus, Hypothyroidism, Thyroid ophthalmopath, Hyperuricemia, Vitamin D deficiency.  PAIN:  Are you having pain? No  PRECAUTIONS: Fall  WEIGHT BEARING RESTRICTIONS No  FALLS: Has patient fallen in last 6 months? No  LIVING ENVIRONMENT: Lives with: lives alone Lives in: House/apartment Stairs: Yes: Internal: Full flight steps; on left going up and External: 5-6 steps; can reach both Has following equipment at home:  [facility] w/quad tip, shower chair  PLOF:  "I am independent, just slow"  PATIENT GOALS "Increase my general feeling of body imagery" "I wanna get rid of my fatigue"   OBJECTIVE:   COGNITION: Overall cognitive status: Within functional limits for tasks assessed   SENSATION: Pt denies changes in sensation   COORDINATION: Heel to shin test: WFL bilaterally   POSTURE: rounded shoulders, forward head, and posterior pelvic tilt  LOWER EXTREMITY MMT tested in seated position:    MMT Right Eval Left Eval  Hip flexion 4- 4  Hip extension    Hip abduction 4 4  Hip adduction 5 5  Hip internal rotation    Hip external rotation    Knee flexion 4- 4-  Knee extension 4+ 4+  Ankle dorsiflexion 4+ 4+  Ankle plantarflexion    Ankle inversion    Ankle eversion    (Blank rows = not tested)  BED MOBILITY:  Pt denies issues   TRANSFERS: Assistive device utilized: None  Sit to stand: SBA- pt requires UE support to stand w/multiple attempts due to poor body mechanics  Stand to sit: SBA  GAIT: Gait pattern:  bilateral genu valgus, step through pattern, decreased arm swing- Right, decreased step length- Right, decreased step length- Left, decreased hip/knee flexion- Right, decreased hip/knee flexion- Left, decreased ankle dorsiflexion- Right, decreased ankle dorsiflexion- Left, poor foot clearance- Right, and poor foot clearance- Left Distance walked: Various clinic distances  Assistive device utilized: None Level of assistance: Modified independence Comments: No  shuffling or festination noted. Pt demonstrated minor anterior lean and small step length bilaterally.   FUNCTIONAL TESTs:   [facility] PT Assessment - 03/03/22 1045       Transfers   Five time sit to stand comments  27.03s   Needed UE support after 2nd rep. Multiple attempts to stand each rep     Ambulation/Gait   Gait velocity 32.8' over 10.16s = 3.22 ft/s without AD      Balance   Balance Assessed Yes      Standardized Balance Assessment   Standardized Balance Assessment Timed Up and Go Test;Mini-BESTest  Mini-BESTest   Timed UP & GO with Dual Task Moderate: Dual Task affects either counting OR walking (>10%) when compared to the TUG without Dual Task.      Timed Up and Go Test   Normal TUG (seconds) 12.31    Cognitive TUG (seconds) 14.91   Counting backwards by 3s starting at 85              TODAY'S TREATMENT:  See below    PATIENT EDUCATION: Education details: Eval findings, POC Person educated: Patient Education method: Customer service manager Education comprehension: verbalized understanding and needs further education   HOME EXERCISE PROGRAM: To be established next session     GOALS: Goals reviewed with patient? Yes  SHORT TERM GOALS: Target date: 03/31/2022  Pt will be independent with initial PD-specific HEP for improved strength, balance, transfers and gait.  Baseline: Not performing HEP due to fear- most moves are in standing position  Goal status: INITIAL  2.  MiniBest to be completed and appropriate STG/LTG written  Baseline: MiniBest started but not completed on eval  Goal status: INITIAL  3.  Pt will improve 5 x STS to less than or equal to 25 seconds without UE support and ability to stand on first attempt to demonstrate improved functional strength and transfer efficiency.   Baseline: 27.03s w/UE support on reps 2-5, multiple attempts to stand each rep  Goal status: INITIAL  4.  Pt will initiate walking program in community  w/LRAD for return to PLOF and improved cardiovascular endurance  Baseline: Pt not walking due to fear of falling Goal status: INITIAL   LONG TERM GOALS: Target date: 04/28/2022  Pt will verbalize understanding of local PD community resources, including fitness post DC.   Baseline:  Goal status: INITIAL  2.  Pt will be independent with final PD-specific HEP for improved strength, balance, transfers and gait. Baseline:  Goal status: INITIAL  3.  MiniBest goal  Baseline:  Goal status: INITIAL  4.  Pt will improve gait velocity to at least 3.7 ft/s with LRAD for improved gait efficiency   Baseline: 3.99f/s without AD Goal status: INITIAL  5.  Pt will improve 5 x STS to less than or equal to 15 seconds without UE support to demonstrate improved functional strength and transfer efficiency.   Baseline: 27.03s w/UE support on reps 2-5 Goal status: INITIAL   ASSESSMENT:  CLINICAL IMPRESSION: Patient is a 76year old female referred to Neuro OPPT for PD. Pt's PMH is significant for: Diabetes mellitus type 2 with hyperglycemia, with long-term current use of insulin, stage 3b chronic kidney disease and hypertension, Diabetic mild nonproliferative retinopathy, both eyes, pressure callus, Dyslipidemia associated with type 2 diabetes mellitus, Hypothyroidism, Thyroid ophthalmopath, Hyperuricemia, Vitamin D deficiency.  The following deficits were present during the exam: impaired gait kinematics, fear-avoidance behavior, decreased strength and decreased balance. Based on 5x STS, TUG and history of falls, pt is an incr risk for falls. Pt would benefit from skilled PT to address these impairments and functional limitations to maximize functional mobility independence   OBJECTIVE IMPAIRMENTS Abnormal gait, decreased activity tolerance, decreased balance, decreased endurance, decreased knowledge of use of DME, decreased mobility, difficulty walking, decreased ROM, and decreased strength.    ACTIVITY LIMITATIONS lifting, standing, squatting, stairs, and transfers  PARTICIPATION LIMITATIONS: cleaning, laundry, driving, and community activity  PERSONAL FACTORS Age, Fitness, Past/current experiences, Time since onset of injury/illness/exacerbation, Transportation, and 1 comorbidity: DM 2  are also affecting patient's functional outcome.   REHAB  POTENTIAL: Good  CLINICAL DECISION MAKING: Stable/uncomplicated  EVALUATION COMPLEXITY: Low  PLAN: PT FREQUENCY: 2x/week  PT DURATION: 8 weeks  PLANNED INTERVENTIONS: Therapeutic exercises, Therapeutic activity, Neuromuscular re-education, Balance training, Gait training, Patient/Family education, Stair training, DME instructions, and Re-evaluation  PLAN FOR NEXT SESSION: MiniBest, establish HEP (emphasis on seated PWR moves, sit <>stands, and seated hamstring stretch)    Daleigh Pollinger E Macsen Nuttall, PT, DPT 03/03/2022, 11:28 AM

## 2022-03-07 ENCOUNTER — Ambulatory Visit: Payer: Medicare PPO

## 2022-03-07 ENCOUNTER — Ambulatory Visit (INDEPENDENT_AMBULATORY_CARE_PROVIDER_SITE_OTHER): Payer: Medicare PPO

## 2022-03-07 DIAGNOSIS — E1159 Type 2 diabetes mellitus with other circulatory complications: Secondary | ICD-10-CM

## 2022-03-07 DIAGNOSIS — N183 Chronic kidney disease, stage 3 unspecified: Secondary | ICD-10-CM

## 2022-03-07 DIAGNOSIS — R2689 Other abnormalities of gait and mobility: Secondary | ICD-10-CM

## 2022-03-07 DIAGNOSIS — R2681 Unsteadiness on feet: Secondary | ICD-10-CM

## 2022-03-07 DIAGNOSIS — G20 Parkinson's disease: Secondary | ICD-10-CM

## 2022-03-07 DIAGNOSIS — Z794 Long term (current) use of insulin: Secondary | ICD-10-CM

## 2022-03-07 DIAGNOSIS — I779 Disorder of arteries and arterioles, unspecified: Secondary | ICD-10-CM

## 2022-03-07 NOTE — Chronic Care Management (AMB) (Signed)
Chronic Care Management   CCM RN Visit Note  03/07/2022 Name: Pamela Patton MRN: [id number] DOB: [birth year]  Subjective: Pamela Patton is a 76 y.o. year old female who is a primary care patient of Koberlein, Steele Berg, MD. The care management team was consulted for assistance with disease management and care coordination needs.    Engaged with patient by telephone for follow up visit in response to provider referral for case management and/or care coordination services.   Consent to Services:  The patient was given information about Chronic Care Management services, agreed to services, and gave verbal consent prior to initiation of services.  Please see initial visit note for detailed documentation.   Patient agreed to services and verbal consent obtained.   Assessment: Review of patient past medical history, allergies, medications, health status, including review of consultants reports, laboratory and other test data, was performed as part of comprehensive evaluation and provision of chronic care management services.   SDOH (Social Determinants of Health) assessments and interventions performed:    CCM Care Plan  No Known Allergies  Outpatient Encounter Medications as of 03/07/2022  Medication Sig Note   aspirin 81 MG tablet Take 81 mg by mouth daily with lunch.     atorvastatin (LIPITOR) 40 MG tablet Take 40 mg by mouth daily with lunch.     Blood Pressure Monitoring (BLOOD PRESSURE CUFF) MISC 1 Product by Does not apply route as needed.    Calcium Carbonate-Vitamin D 600-400 MG-UNIT tablet Take 1 tablet by mouth 3 (three) times a week.     carbidopa-levodopa (SINEMET IR) 25-100 MG tablet Take 2 tablets by mouth 3 (three) times daily. 8am/noon/4pm.    Cholecalciferol (VITAMIN D PO) Take 5,000 Units by mouth 3 (three) times a week.     Cyanocobalamin (VITAMIN B12 PO) Take 5,000 mcg by mouth 2 (two) times a week.    glucose 4 GM chewable tablet Chew 1 tablet by mouth as needed for low  blood sugar. 06/30/2020: As needed    glucose blood (ACCU-CHEK AVIVA PLUS) test strip Use to test blood sugar 4 times a day (Dx: E11.65)    HUMALOG KWIKPEN 100 UNIT/ML KiwkPen Inject 0-4 Units into the skin 3 (three) times daily with meals. Sliding scale per Dr Altheimer's office notes from 09/24/2019    hydrochlorothiazide (HYDRODIURIL) 25 MG tablet Take 25 mg by mouth daily with lunch.     insulin glargine (LANTUS) 100 UNIT/ML injection Inject 0.05 mLs (5 Units total) into the skin every evening. (Patient taking differently: Inject 5 Units into the skin daily.)    Insulin Syringe-Needle U-100 (INSULIN SYRINGE .3CC/31GX5/16") 31G X 5/16" 0.3 ML MISC Use as directed for Lantus injection once a day    levothyroxine (SYNTHROID, LEVOTHROID) 75 MCG tablet Take 75 mcg by mouth daily. 12/15/2021: Patient not taking 1 day a week (Sunday)    losartan (COZAAR) 50 MG tablet Take 1 tablet (50 mg total) by mouth daily.    NONFORMULARY OR COMPOUNDED ITEM [facility]:  Antifungal - Terbinafine 3%, Fluconazole 2%, Tea Tree Oil 5%, Urea 10%, Ibuprofen 2% in DMSO Suspension #65m. Apply to the affected nail(s) once (at bedtime) or twice daily.    Nutritional Supplements (FEEDING SUPPLEMENT, GLUCERNA 1.2 CAL,) LIQD Take 237 mLs by mouth daily as needed. 06/30/2020: In place of meal when out at time   OSUPERVALU INCLancets 378GMISC Use to test blood sugar 4 times a day (Dx: E11.65)    Pioglitazone HCl (ACTOS PO) Take  45 mg by mouth daily with lunch.     Polyethyl Glycol-Propyl Glycol (SYSTANE) 0.4-0.3 % GEL ophthalmic gel Place 1 application into both eyes at bedtime as needed (FOR EYES).    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   Carotid artery disease ([place]) 03/21/2017   Hypertension associated with  diabetes ([place]) 02/22/2017   Hyperlipidemia associated with type 2 diabetes mellitus ([place]) 02/22/2017   Hyperuricemia 10/31/2016   PD (Parkinson's disease) ([place]) 08/08/2016   Loss of weight 07/25/2016   DM (diabetes mellitus), type 2 with renal complications 76/22/6333   CKD (chronic kidney disease) stage 3, GFR 30-59 ml/min (HCC) 10/08/2012   Vitamin D deficiency 08/24/2012   Hypothyroid, s/p radioactive iodine tx for graves disease     Conditions to be addressed/monitored:CAD, HTN, HLD, DMII, and Parkinson's Disease  Care Plan : RN Care Manager Plan of Care  Updates made by Dimitri Ped, RN since 03/07/2022 12:00 AM     Problem: Chronic Disease Management and Care Coordination Needs (DM, Parkinsons Disease, CAD,CKD3, HTN ,HLD)   Priority: High     Long-Range Goal: Establish Plan of Care for Chronic Disease Management Needs(DM, Parkinson's Disease, CAD,CKD3, HTN ,HLD)   Start Date: 11/15/2021  Expected End Date: 11/15/2022  Priority: High  Note:   Current Barriers:  Knowledge Deficits related to plan of care for management of CAD, HTN, HLD, DMII, CKD Stage 3, and Parkinson's Disease  Care Coordination needs related to Limited social support, Transportation, and ADL IADL limitations Chronic Disease Management support and education needs related to CAD, HTN, HLD, DMII, CKD Stage 3, and Parkinson's Disease   Lacks caregiver support Transportation barriers  States that her Parkinson's disease makes her slower. States she is starting PT today to help make her stronger. States she still spends a lot of time keeping up with her medications and insulin.  States she has been doing better taking her pills on time and she has started getting her pills in packs from [facility].  States she to taking her Lantus in the evening but she sometimes will take late if she falls asleep.  States she checks her CBG 3-4 times a day and doses her insulin depending on her blood sugars.  States her CBGs in the  morning have ranged 130-140 and 140-200 later in the day.   States she tripped over a box yesterday with no injury. States she tries to eat healthy. States her B/P was normal when she last checked. Denies any chest pains or shortness of breath.  RNCM Clinical Goal(s):  Patient will verbalize understanding of plan for management of CAD, HTN, HLD, DMII, CKD Stage 3, and Parkinson's Disease  as evidenced by voiced adherence to plan of care verbalize basic understanding of  CAD, HTN, HLD, DMII, CKD Stage 3, and Parkinson's Disease  disease process and self health management plan as evidenced by voiced understanding and teach back take all medications exactly as prescribed and will call provider for medication related questions as evidenced by dispense report and pt verbalization attend all scheduled medical appointments: CCM LCSW 03/17/22, Endocrinology 06/03/22, Annual Wellness Visit 01/02/23 as evidenced by medical records demonstrate Improved adherence to prescribed treatment plan for CAD, HTN, HLD, DMII, CKD Stage 3, and Parkinson's Disease  as evidenced by readings within limits, voiced adherence  to plan of care continue to work with RN Care Manager to address care management and care coordination needs related to  CAD, HTN, HLD, DMII, CKD Stage 3, and Parkinson's Disease  as evidenced by adherence to CM Team Scheduled appointments work with pharmacist to address polypharmacy and adherence issues related toCAD, HTN, HLD, DMII, CKD Stage 3, and Parkinson's Disease  as evidenced by review or EMR and patient or pharmacist report work with Education officer, museum to address  related to the management of Limited social support, Transportation, Level of care concerns, and ADL IADL limitations related to the management of CAD, HTN, HLD, DMII, CKD Stage 3, and Parkinson's Disease  as evidenced by review of EMR and patient or Education officer, museum report through collaboration with Consulting civil engineer, provider, and care team.    Interventions: 1:1 collaboration with primary care provider regarding development and update of comprehensive plan of care as evidenced by provider attestation and co-signature Inter-disciplinary care team collaboration (see longitudinal plan of care) Evaluation of current treatment plan related to  self management and patient's adherence to plan as established by provider   CAD Interventions: (Status:  Goal on track:  Yes.) Long Term Goal Assessed understanding of CAD diagnosis Medications reviewed including medications utilized in CAD treatment plan Provided education on importance of blood pressure control in management of CAD Provided education on Importance of limiting foods high in cholesterol Counseled on importance of regular laboratory monitoring as prescribed Reviewed Importance of taking all medications as prescribed Reviewed Importance of attending all scheduled provider appointments Advised to report any changes in symptoms or exercise tolerance    Chronic Kidney Disease Interventions:  (Status:  Goal on track:  Yes.) Long Term Goal Evaluation of current treatment plan related to chronic kidney disease self management and patient's adherence to plan as established by provider      Provided education to patient re: stroke prevention, s/s of heart attack and stroke    Reviewed medications with patient and discussed importance of compliance    Discussed complications of poorly controlled blood pressure such as heart disease, stroke, circulatory complications, vision complications, kidney impairment, sexual dysfunction    Discussed plans with patient for ongoing care management follow up and provided patient with direct contact information for care management team    Discussed the impact of chronic kidney disease on daily life and mental health and acknowledged and normalized feelings of disempowerment, fear, and frustration    Provided education on kidney disease progression     Last practice recorded BP readings:  BP Readings from Last 3 Encounters:  02/21/22 (!) 138/58  11/01/21 136/78  08/13/21 118/60  Most recent eGFR/CrCl: No results found for: EGFR  No components found for: CRCL    Diabetes Interventions:  (Status:  Goal on track:  Yes.) Long Term Goal Assessed patient's understanding of A1c goal: <7% Provided education to patient about basic DM disease process Reviewed medications with patient and discussed importance of medication adherence Counseled on importance of regular laboratory monitoring as prescribed Provided patient with written educational materials related to hypo and hyperglycemia and importance of correct treatment Advised patient, providing education and rationale, to check cbg 3-4 times daily and record, calling provider for findings outside established parameters Referral made to pharmacy team for assistance with polypharmacy, complicated medication regime and adherence issues Referral made to social work team for assistance with scheduled with CCM LCSW 01/20/22, Endocrinology 01/28/22 to address housing, level of care issues Review of patient status, including review of  consultants reports, relevant laboratory and other test results, and medications completed Reinforced importance of taking insulin and CBGs as ordered and to taking her Lantus every day. Reinforced to keep appointment with endocrinologist Lab Results  Component Value Date   HGBA1C 8.1 (H) 09/11/2018  Last Hemoglobin A1C at endocrinologist 9.4% 01/28/22  Parkinson's Disease   (Status:  Goal on track:  Yes.)  Long Term Goal Evaluation of current treatment plan related to  Parkinson's Disease  ,  Parkinson's Disease   self-management and patient's adherence to plan as established by provider. Discussed plans with patient for ongoing care management follow up and provided patient with direct contact information for care management team Evaluation of current treatment plan  related to Parkinson's Disease  and patient's adherence to plan as established by provider Provided education to patient re: Parkinson's Disease  Social Work referral for scheduled with CCM LCSW 03/17/22 to follow up on housing, level of care issues Pharmacy referral for polypharmacy, complicated medication regime and adherence issues   now getting packaged medications  Reviewed to keep appointments for PT. Reinforced instructions on home safety and fall precautions. Reinforced to contact her insurance company to check on benefit for personal emergency alarm   Hyperlipidemia Interventions:  (Status:  Condition stable.  Not addressed this visit.) Long Term Goal Medication review performed; medication list updated in electronic medical record.  Provider established cholesterol goals reviewed Counseled on importance of regular laboratory monitoring as prescribed Reviewed role and benefits of statin for ASCVD risk reduction Reviewed importance of limiting foods high in cholesterol  Hypertension Interventions:  (Status:  Goal on track:  Yes.) Long Term Goal Last practice recorded BP readings:  BP Readings from Last 3 Encounters:  02/21/22 (!) 138/58  11/01/21 136/78  08/13/21 118/60  Most recent eGFR/CrCl: No results found for: EGFR  No components found for: CRCL  Evaluation of current treatment plan related to hypertension self management and patient's adherence to plan as established by provider Provided education to patient re: stroke prevention, s/s of heart attack and stroke Reviewed medications with patient and discussed importance of compliance Counseled on the importance of exercise goals with target of 150 minutes per week Discussed plans with patient for ongoing care management follow up and provided patient with direct contact information for care management team Provided education on prescribed diet low sodium low CHO heart healthy Discussed complications of poorly controlled blood  pressure such as heart disease, stroke, circulatory complications, vision complications, kidney impairment, sexual dysfunction Reinforced to try to take B/P at least weekly    Patient Goals/Self-Care Activities: Take all medications as prescribed Attend all scheduled provider appointments Call pharmacy for medication refills 3-7 days in advance of running out of medications Attend church or other social activities Call provider office for new concerns or questions  Work with the social worker to address care coordination needs and will continue to work with the clinical team to address health care and disease management related needs keep appointment with eye doctor check blood sugar at prescribed times: before meals and at bedtime and when you have symptoms of low or high blood sugar take the blood sugar log to all doctor visits drink 6 to 8 glasses of water each day fill half of plate with vegetables switch to sugar-free drinks Take Lantus insulin daily around the same time check blood pressure weekly choose a place to take my blood pressure (home, clinic or office, retail store) take blood pressure log to all doctor appointments keep  all doctor appointments begin an exercise program eat more whole grains, fruits and vegetables, lean meats and healthy fats limit salt intake to 2375m/day call for medicine refill 2 or 3 days before it runs out take all medications exactly as prescribed call doctor with any symptoms you believe are related to your medicine  Follow Up Plan:  Telephone follow up appointment with care management team member scheduled for:  7/31//23 The patient has been provided with contact information for the care management team and has been advised to call with any health related questions or concerns.       Plan:Telephone follow up appointment with care management team member scheduled for:  04/24/22 The patient has been provided with contact information for the  care management team and has been advised to call with any health related questions or concerns.  MPeter GarterRN, BJackquline Denmark CDE Care Management Coordinator [facility] ([phone]

## 2022-03-07 NOTE — Patient Instructions (Signed)
Visit Information  Thank you for taking time to visit with me today. Please don't hesitate to contact me if I can be of assistance to you before our next scheduled telephone appointment.  Following are the goals we discussed today:  Take all medications as prescribed Attend all scheduled provider appointments Call pharmacy for medication refills 3-7 days in advance of running out of medications Attend church or other social activities Call provider office for new concerns or questions  Work with the social worker to address care coordination needs and will continue to work with the clinical team to address health care and disease management related needs keep appointment with eye doctor check blood sugar at prescribed times: before meals and at bedtime and when you have symptoms of low or high blood sugar take the blood sugar log to all doctor visits drink 6 to 8 glasses of water each day fill half of plate with vegetables switch to sugar-free drinks Take Lantus insulin daily around the same time check blood pressure weekly choose a place to take my blood pressure (home, clinic or office, retail store) take blood pressure log to all doctor appointments keep all doctor appointments begin an exercise program eat more whole grains, fruits and vegetables, lean meats and healthy fats limit salt intake to '2300mg'$ /day call for medicine refill 2 or 3 days before it runs out take all medications exactly as prescribed call doctor with any symptoms you believe are related to your medicine  Our next appointment is by telephone on 05/02/22 at 10:45 AM  Please call the care guide team at [phone] if you need to cancel or reschedule your appointment.   If you are experiencing a Mental Health or [place] or need someone to talk to, please call the Suicide and Crisis Lifeline: 988 call the [place] National Suicide Prevention Lifeline: (605)133-6432 or TTY: (479)807-4357 TTY  765-752-7629) to talk to a trained counselor call 1-800-273-TALK (toll free, 24 hour hotline) go to [facility] [address], [place] [phone]) call 911   Patient verbalizes understanding of instructions and care plan provided today and agrees to view in [facility]. Active MyChart status and patient understanding of how to access instructions and care plan via MyChart confirmed with patient.     Peter Garter RN, Jackquline Denmark, CDE Care Management Coordinator [facility] [phone]

## 2022-03-07 NOTE — Therapy (Addendum)
OUTPATIENT PHYSICAL THERAPY TREATMENT NOTE   Patient Name: Pamela Patton MRN: [id number] DOB:1946/06/06, 76 y.o., female Today's Date: 03/07/2022  PCP: Caren Macadam, MD REFERRING PROVIDER: Ludwig Clarks, DO   END OF SESSION:   PT End of Session - 03/07/22 1240     Visit Number 2    Number of Visits 17    Date for PT Re-Evaluation 05/26/22    Authorization Type [employer]    PT Start Time 4401   patient late   PT Stop Time 1316    PT Time Calculation (min) 40 min    Equipment Utilized During Treatment Gait belt    Activity Tolerance Patient tolerated treatment well    Behavior During Therapy Flat affect             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   Carotid artery disease 03/21/2017   Cataract    immature    Chest pain summer 2012   Chicken pox    CKD (chronic kidney disease) 10/08/2012   Stage 3, GFR 30-59 ml/min   Diverticulosis    difficulty with colonoscopy 2005   DM (diabetes mellitus), type 2 with renal complications 02/72/5366   Fracture    radial/ulnar 03/2016   GERD (gastroesophageal reflux disease)    Heart murmur    innocent murmur   High cholesterol    Hypertension associated with diabetes 02/22/2017   Hyperuricemia 10/31/2016   Hypothyroidism    s/p radioactive iodine tx for graves in 2001   Intestinal metaplasia of gastric mucosa    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   Parkinson's disease 08/08/2016   UTI (urinary tract infection)    Vitamin D deficiency 08/24/2012   Weight loss    eval with GI in 2016; s/p CT/MRI and PET scans   Past Surgical History:  Procedure Laterality Date   ABDOMINAL HYSTERECTOMY  1989   CATARACT EXTRACTION, BILATERAL     COLONOSCOPY     DG BARIUM SWALLOW ([place] [facility])     9 yrs ago per pt due to incomplete colonsocopy   Patient Active Problem List   Diagnosis Date Noted   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   Benign neoplasm  of kidney 04/06/2017   Carotid artery disease ([place]) 03/21/2017   Hypertension associated with diabetes ([place]) 02/22/2017   Hyperlipidemia associated with type 2 diabetes mellitus ([place]) 02/22/2017   Hyperuricemia 10/31/2016   PD (Parkinson's disease) ([place]) 08/08/2016   Loss of weight 07/25/2016   DM (diabetes mellitus), type 2 with renal complications 44/12/4740   CKD (chronic kidney disease) stage 3, GFR 30-59 ml/min ([place]) 10/08/2012   Vitamin D deficiency 08/24/2012   Hypothyroid, s/p radioactive iodine tx for graves disease     REFERRING DIAG: G20 (ICD-10-CM) - Parkinson's disease ([place])   THERAPY DIAG:  Other abnormalities of gait and mobility  Unsteadiness on feet  Rationale for Evaluation and Treatment Rehabilitation  PERTINENT HISTORY: Diabetes mellitus type 2 with hyperglycemia, with long-term current use of insulin, stage 3b chronic kidney disease and hypertension, Diabetic mild nonproliferative retinopathy, both eyes, pressure callus, Dyslipidemia associated with type 2 diabetes mellitus, Hypothyroidism, Thyroid ophthalmopath, Hyperuricemia, Vitamin D deficiency.  PRECAUTIONS: fall  SUBJECTIVE: Patient reports doing well, but did have a fall yesterday in her garage tripping over a box. She reports being able to get up off the floor herself after crawling to a surface she could pull up on.  PAIN:  Are you having pain? No     TODAY'S TREATMENT:  NMR:  -NuStep x10 mins B UE/LE for improved large amplitude reciprocal coordination, goal >65 steps  -MiniBest Test: 12/28 Therex:  -Provided patient with HEP      PATIENT EDUCATION: Education details: home safety re: fall precautions, OM results, HEP Person educated: Patient Education method: Explanation and Demonstration Education comprehension: verbalized understanding and needs further education     HOME EXERCISE PROGRAM: Access Code: [email or web address] URL: [email or web address] Date: 03/07/2022 Prepared by:  [name]  Exercises - Standing Balance in Corner with Eyes Closed  - 1 x daily - 7 x weekly - 3 sets - 10 reps - Standing Near Stance in Corner  - 1 x daily - 7 x weekly - 3 sets - 10 reps - Standing Balance in Corner  - 1 x daily - 7 x weekly - 3 sets - 10 reps - Semi-Tandem Corner Balance With Eyes Open  - 1 x daily - 7 x weekly - 3 sets - 10 reps - Corner Balance Feet Together: Eyes Open With Head Turns  - 1 x daily - 7 x weekly - 3 sets - 10 reps - Seated Long Arc Quad  - 1 x daily - 7 x weekly - 3 sets - 10 reps - Seated Hamstring Stretch  - 1 x daily - 7 x weekly - 3 sets - 10 reps - Sit to Stand  - 1 x daily - 7 x weekly - 3 sets - 10 reps    [facility] PT Assessment - 03/07/22 0001       Mini-BESTest   Sit To Stand Severe: Unable to stand up from chair without assistance, OR needs several attempts with use of hands.    Rise to Toes < 3 s.    Stand on one leg (left) Severe: Unable    Stand on one leg (right) Severe: Unable    Stand on one leg - lowest score 0    Compensatory Stepping Correction - Forward Moderate: More than one step is required to recover equilibrium    Compensatory Stepping Correction - Backward Moderate: More than one step is required to recover equilibrium    Compensatory Stepping Correction - Left Lateral Moderate: Several steps to recover equilibrium    Compensatory Stepping Correction - Right Lateral Moderate: Several steps to recover equilibrium    Stepping Corredtion Lateral - lowest score 1    Stance - Feet together, eyes open, firm surface  Normal: 30s    Stance - Feet together, eyes closed, foam surface  Moderate: < 30s    Incline - Eyes Closed Moderate: Stands independently < 30s OR aligns with surface    Change in Gait Speed Moderate: Unable to change walking speed or signs of imbalance    Walk with head turns - Horizontal Moderate: performs head turns with reduction in gait speed.    Walk with pivot turns Moderate:Turns with feet close SLOW (>4  steps) with good balance.    Step over obstacles Moderate: Steps over box but touches box OR displays cautious behavior by slowing gait.    Timed UP & GO with Dual Task Moderate: Dual Task affects either counting OR walking (>10%) when compared to the TUG without Dual Task.    Mini-BEST total score 12      Timed Up and Go Test   Normal TUG (seconds) 12.31    Cognitive TUG (seconds) 14.91  GOALS: Goals reviewed with patient? Yes   SHORT TERM GOALS: Target date: 03/31/2022   Pt will be independent with initial PD-specific HEP for improved strength, balance, transfers and gait.   Baseline: Not performing HEP due to fear- most moves are in standing position  Goal status: INITIAL   2.  MiniBest to be completed and appropriate STG/LTG written  Baseline: MiniBest started but not completed on eval  Goal status: INITIAL   3.  Pt will improve 5 x STS to less than or equal to 25 seconds without UE support and ability to stand on first attempt to demonstrate improved functional strength and transfer efficiency.    Baseline: 27.03s w/UE support on reps 2-5, multiple attempts to stand each rep  Goal status: INITIAL   4.  Pt will initiate walking program in community w/LRAD for return to PLOF and improved cardiovascular endurance  Baseline: Pt not walking due to fear of falling Goal status: INITIAL     LONG TERM GOALS: Target date: 04/28/2022   Pt will verbalize understanding of local PD community resources, including fitness post DC.    Baseline:  Goal status: INITIAL   2.  Pt will be independent with final PD-specific HEP for improved strength, balance, transfers and gait. Baseline:  Goal status: INITIAL   3.  MiniBest goal  Baseline:  Goal status: INITIAL   4.  Pt will improve gait velocity to at least 3.7 ft/s with LRAD for improved gait efficiency    Baseline: 3.58f/s without AD Goal status: INITIAL   5.  Pt will improve 5 x STS to less than or equal to  15 seconds without UE support to demonstrate improved functional strength and transfer efficiency.    Baseline: 27.03s w/UE support on reps 2-5 Goal status: INITIAL     ASSESSMENT:   CLINICAL IMPRESSION: Patient seen for skilled PT session with emphasis on large amplitude reciprocal coordination as well as balance strategy re-training. Patient with some carryover of large amplitude reciprocal stepping at faster cadence from NuStep to ambulating around the clinic. Patient completed the following outcome measure: MiniBESTest Patient demonstrates increased fall risk as noted by score of 12/28 on the MiniBESTest <16/28= predictive of falls in elderly, <17.5/28= predictive of falls in stroke, <19/28= predictive of falls in PD, <19.5/28= benefit from use of AD in MS MCID= 4 points  (Database of Knowledge Translation Tools Assessment Summary, 2017).  Discussed with patient and she verbalized understanding. PT providing patient with basic balance and therex HEP with explicit instructions to have sturdy chair in front of her when standing in the corner to better assist with maintaining her balance. Patient tolerating therapy well- continue POC.      OBJECTIVE IMPAIRMENTS Abnormal gait, decreased activity tolerance, decreased balance, decreased endurance, decreased knowledge of use of DME, decreased mobility, difficulty walking, decreased ROM, and decreased strength.    ACTIVITY LIMITATIONS lifting, standing, squatting, stairs, and transfers   PARTICIPATION LIMITATIONS: cleaning, laundry, driving, and community activity   PERSONAL FACTORS Age, Fitness, Past/current experiences, Time since onset of injury/illness/exacerbation, Transportation, and 1 comorbidity: DM 2  are also affecting patient's functional outcome.    REHAB POTENTIAL: Good   CLINICAL DECISION MAKING: Stable/uncomplicated   EVALUATION COMPLEXITY: Low   PLAN: PT FREQUENCY: 2x/week   PT DURATION: 8 weeks   PLANNED  INTERVENTIONS: Therapeutic exercises, Therapeutic activity, Neuromuscular re-education, Balance training, Gait training, Patient/Family education, Stair training, DME instructions, and Re-evaluation   PLAN FOR NEXT SESSION: begin PWR moves ex, SMicrobiologist  stepping strategy re-training.    Debbora Dus, PT Debbora Dus, PT, DPT, CBIS  03/07/2022, 1:19 PM

## 2022-03-08 ENCOUNTER — Telehealth: Payer: Self-pay | Admitting: Pharmacist

## 2022-03-08 NOTE — Chronic Care Management (AMB) (Signed)
Chronic Care Management Pharmacy Assistant   Name: Pamela Patton  MRN: [id number] DOB: [birth year]  Reason for Encounter: Disease State / Diabetes Assessment Call   Conditions to be addressed/monitored: DMII  Recent office visits:  None  Recent consult visits:  03/07/2022 Pamela Patton PT - Patient was seen for Other abnormalities of gait and mobility and unsteadiness on feet. Follow up is 2 times per week for 8 weeks starting 03/03/2022.  02/21/2022 Pamela Bogus DO (neurology) - Patient was seen for parkinson's disease. Referral to Physical Therapy.  No medication changes. No follow up noted.   01/28/2022 Pamela Floras MD (endocrinology) - Patient was seen for Diabetes mellitus type 2 with hyperglycemia, with long-term current use of insulin and additional issues. Increase Humalog to 3 units 10 minutes before each meal plus correction scale: Glucose Units Below 100 = 0 units  101 - 120 = 1 unit  121 - 150 = 2 units  151 - 200 = 3 units  201 - 250 = 4 units  251 - 300 = 5 units  over 300 = 6 units Follow up in 3 months  Hospital visits:  None  Medications: Outpatient Encounter Medications as of 03/08/2022  Medication Sig Note   aspirin 81 MG tablet Take 81 mg by mouth daily with lunch.     atorvastatin (LIPITOR) 40 MG tablet Take 40 mg by mouth daily with lunch.     Blood Pressure Monitoring (BLOOD PRESSURE CUFF) MISC 1 Product by Does not apply route as needed.    Calcium Carbonate-Vitamin D 600-400 MG-UNIT tablet Take 1 tablet by mouth 3 (three) times a week.     carbidopa-levodopa (SINEMET IR) 25-100 MG tablet Take 2 tablets by mouth 3 (three) times daily. 8am/noon/4pm.    Cholecalciferol (VITAMIN D PO) Take 5,000 Units by mouth 3 (three) times a week.     Cyanocobalamin (VITAMIN B12 PO) Take 5,000 mcg by mouth 2 (two) times a week.    glucose 4 GM chewable tablet Chew 1 tablet by mouth as needed for low blood sugar. 06/30/2020: As needed    glucose blood (ACCU-CHEK  AVIVA PLUS) test strip Use to test blood sugar 4 times a day (Dx: E11.65)    HUMALOG KWIKPEN 100 UNIT/ML KiwkPen Inject 0-4 Units into the skin 3 (three) times daily with meals. Sliding scale per Dr Altheimer's office notes from 09/24/2019    hydrochlorothiazide (HYDRODIURIL) 25 MG tablet Take 25 mg by mouth daily with lunch.     insulin glargine (LANTUS) 100 UNIT/ML injection Inject 0.05 mLs (5 Units total) into the skin every evening. (Patient taking differently: Inject 5 Units into the skin daily.)    Insulin Syringe-Needle U-100 (INSULIN SYRINGE .3CC/31GX5/16") 31G X 5/16" 0.3 ML MISC Use as directed for Lantus injection once a day    levothyroxine (SYNTHROID, LEVOTHROID) 75 MCG tablet Take 75 mcg by mouth daily. 12/15/2021: Patient not taking 1 day a week (Sunday)    losartan (COZAAR) 50 MG tablet Take 1 tablet (50 mg total) by mouth daily.    NONFORMULARY OR COMPOUNDED ITEM [facility]:  Antifungal - Terbinafine 3%, Fluconazole 2%, Tea Tree Oil 5%, Urea 10%, Ibuprofen 2% in DMSO Suspension #56m. Apply to the affected nail(s) once (at bedtime) or twice daily.    Nutritional Supplements (FEEDING SUPPLEMENT, GLUCERNA 1.2 CAL,) LIQD Take 237 mLs by mouth daily as needed. 06/30/2020: In place of meal when out at time   OSUPERVALU INCLancets 321JMISC Use to test blood  sugar 4 times a day (Dx: E11.65)    Pioglitazone HCl (ACTOS PO) Take 45 mg by mouth daily with lunch.     Polyethyl Glycol-Propyl Glycol (SYSTANE) 0.4-0.3 % GEL ophthalmic gel Place 1 application into both eyes at bedtime as needed (FOR EYES).    Facility-Administered Encounter Medications as of 03/08/2022  Medication   0.9 %  sodium chloride infusion   Fill History: ATORVASTATIN 40 MG TABLET 10/21/2021 90   carbidopa 25 mg-levodopa 100 mg tablet 02/25/2022 55   HUMALOG 100 UNIT/ML KWIKPEN 03/12/2021 30   SYNTHROID 75 MCG TABLET 12/05/2021 90   losartan 50 mg tablet 03/08/2022 44   PIOGLITAZONE HCL 45 MG TABLET  10/21/2021 90   HYDROCHLOROTHIAZIDE 25 MG TAB 10/21/2021 90   Reviewed chart prior to disease state call. Spoke with patient regarding BP  Recent Office Vitals: BP Readings from Last 3 Encounters:  02/21/22 (!) 138/58  11/01/21 136/78  08/13/21 118/60   Pulse Readings from Last 3 Encounters:  02/21/22 72  11/01/21 86  08/13/21 64    Wt Readings from Last 3 Encounters:  02/21/22 131 lb (59.4 kg)  12/29/21 131 lb (59.4 kg)  11/01/21 131 lb 11.2 oz (59.7 kg)     Kidney Function Lab Results  Component Value Date/Time   CREATININE 1.53 (H) 08/14/2020 09:44 PM   CREATININE 1.35 (H) 09/11/2018 02:19 PM   GFR 49.31 (L) 09/11/2018 02:19 PM   GFRNONAA 35 (L) 08/14/2020 09:44 PM   GFRAA 43 (L) 11/28/2017 03:00 AM       Latest Ref Rng & Units 08/14/2020    9:44 PM 09/11/2018    2:19 PM 06/07/2018    9:41 AM  BMP  Glucose 70 - 99 mg/dL 299   123     BUN 8 - 23 mg/dL 31   27     Creatinine 0.44 - 1.00 mg/dL 1.53   1.35   1.50    Sodium 135 - 145 mmol/L 142   145     Potassium 3.5 - 5.1 mmol/L 4.3   4.3     Chloride 98 - 111 mmol/L 109   107     CO2 22 - 32 mmol/L 23   32     Calcium 8.9 - 10.3 mg/dL 9.4   9.7      Recent Relevant Labs: Lab Results  Component Value Date/Time   HGBA1C 8.1 (H) 09/11/2018 02:19 PM   HGBA1C 7.3 05/22/2018 12:00 AM   HGBA1C 7.4 02/08/2018 12:00 AM   MICROALBUR 1.1 06/17/2014 09:52 AM    Kidney Function Lab Results  Component Value Date/Time   CREATININE 1.53 (H) 08/14/2020 09:44 PM   CREATININE 1.35 (H) 09/11/2018 02:19 PM   GFR 49.31 (L) 09/11/2018 02:19 PM   GFRNONAA 35 (L) 08/14/2020 09:44 PM   GFRAA 43 (L) 11/28/2017 03:00 AM    Current antihyperglycemic regimen:  Humalog Kwikpen 100 un/ml - 3 units 10 minutes before each meal plus correction scale. Lantus 100 un/ml - inject 5 units every evening Actos 45 mg daily at lunch  What recent interventions/DTPs have been made to improve glycemic control:  Increase Humalog to 3 units 10  minutes before each meal plus correction scale: Glucose Units Below 100 = 0 units  101 - 120 = 1 unit  121 - 150 = 2 units  151 - 200 = 3 units  201 - 250 = 4 units  251 - 300 = 5 units  over 300 = 6 units  Have there been any recent hospitalizations or ED visits since last visit with CPP? No recent hospital visits.  Patient denies hypoglycemic symptoms, including None  Patient denies hyperglycemic symptoms, including none  How often are you checking your blood sugar? Patient states she tries to check her blood sugars 2-3 times daily. She states she hasn't been on her regular schedule so she has only been able to get 1 reading daily recently.   What are your blood sugars ranging?  Fasting: Patient states her fasting readings are between 130-140 After meals: Her recent readings have been between 160-200, this morning non fasting reading was 176 and yesterdays non fasting reading was 191. She does not have any readings past this logged.  She will start checking more often and keeping a log so she will have readings at the appointment scheduled on 03/23/2022  During the week, how often does your blood glucose drop below 70? Patient states her blood sugars never drop below 70  Are you checking your feet daily/regularly? Patient is checking her feet daily in the shower.  Adherence Review: Is the patient currently on a STATIN medication? Yes Is the patient currently on ACE/ARB medication? Yes Does the patient have >5 day gap between last estimated fill dates? No  Care Gaps: AWV - scheduled 01/02/2023 Last BP - 138/58 on 02/21/2022 Last A1C - 9.4 on 01/26/2022 A1C - overdue Foot exam - overdue Colonoscopy - overdue Eye exam - overdue Shingrix - postponed  Star Rating Drugs: Atorvastatin 40 mg  - last filled 10/21/2021 90 DS at [facility] -onboarding with [facility] Losartan 25 mg  - last filled 03/08/2022 44 DS at [facility] Actos 45 mg - last filled 10/21/2021 90 DS at [facility] - onboarding  with [place] Pharmacist Assistant [phone]

## 2022-03-09 ENCOUNTER — Ambulatory Visit: Payer: Medicare PPO

## 2022-03-09 DIAGNOSIS — R2681 Unsteadiness on feet: Secondary | ICD-10-CM

## 2022-03-09 DIAGNOSIS — R2689 Other abnormalities of gait and mobility: Secondary | ICD-10-CM

## 2022-03-09 DIAGNOSIS — Z9181 History of falling: Secondary | ICD-10-CM

## 2022-03-09 DIAGNOSIS — M6281 Muscle weakness (generalized): Secondary | ICD-10-CM

## 2022-03-09 NOTE — Therapy (Signed)
OUTPATIENT PHYSICAL THERAPY TREATMENT NOTE   Patient Name: Pamela Patton MRN: [id number] DOB:11-Mar-1946, 76 y.o., female Today's Date: 03/09/2022  PCP: Caren Macadam, MD REFERRING PROVIDER: Ludwig Clarks, DO   END OF SESSION:   PT End of Session - 03/09/22 1231     Visit Number 3    Number of Visits 17    Date for PT Re-Evaluation 05/26/22    Authorization Type [employer]    PT Start Time 5885    PT Stop Time 0277    PT Time Calculation (min) 42 min    Equipment Utilized During Treatment Gait belt    Activity Tolerance Patient tolerated treatment well    Behavior During Therapy WFL for tasks assessed/performed              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   Carotid artery disease 03/21/2017   Cataract    immature    Chest pain summer 2012   Chicken pox    CKD (chronic kidney disease) 10/08/2012   Stage 3, GFR 30-59 ml/min   Diverticulosis    difficulty with colonoscopy 2005   DM (diabetes mellitus), type 2 with renal complications 41/28/7867   Fracture    radial/ulnar 03/2016   GERD (gastroesophageal reflux disease)    Heart murmur    innocent murmur   High cholesterol    Hypertension associated with diabetes 02/22/2017   Hyperuricemia 10/31/2016   Hypothyroidism    s/p radioactive iodine tx for graves in 2001   Intestinal metaplasia of gastric mucosa    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   Parkinson's disease 08/08/2016   UTI (urinary tract infection)    Vitamin D deficiency 08/24/2012   Weight loss    eval with GI in 2016; s/p CT/MRI and PET scans   Past Surgical History:  Procedure Laterality Date   ABDOMINAL HYSTERECTOMY  1989   CATARACT EXTRACTION, BILATERAL     COLONOSCOPY     DG BARIUM SWALLOW ([facility])     9 yrs ago per pt due to incomplete colonsocopy   Patient Active Problem List   Diagnosis Date Noted   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   Benign  neoplasm of kidney 04/06/2017   Carotid artery disease ([place]) 03/21/2017   Hypertension associated with diabetes ([place]) 02/22/2017   Hyperlipidemia associated with type 2 diabetes mellitus ([place]) 02/22/2017   Hyperuricemia 10/31/2016   PD (Parkinson's disease) ([place]) 08/08/2016   Loss of weight 07/25/2016   DM (diabetes mellitus), type 2 with renal complications 67/20/9470   CKD (chronic kidney disease) stage 3, GFR 30-59 ml/min ([place]) 10/08/2012   Vitamin D deficiency 08/24/2012   Hypothyroid, s/p radioactive iodine tx for graves disease     REFERRING DIAG: G20 (ICD-10-CM) - Parkinson's disease ([place])   THERAPY DIAG:  Other abnormalities of gait and mobility  Unsteadiness on feet  Muscle weakness (generalized)  History of falling  Rationale for Evaluation and Treatment Rehabilitation  PERTINENT HISTORY: Diabetes mellitus type 2 with hyperglycemia, with long-term current use of insulin, stage 3b chronic kidney disease and hypertension, Diabetic mild nonproliferative retinopathy, both eyes, pressure callus, Dyslipidemia associated with type 2 diabetes mellitus, Hypothyroidism, Thyroid ophthalmopath, Hyperuricemia, Vitamin D deficiency.  PRECAUTIONS: fall  SUBJECTIVE: Patient reports doing well. She denies any falls. Patient reports that a limiting factor to her being able to complete her HEP is lack of a clear corner in her  home.    PAIN:  Are you having pain? No    TODAY'S TREATMENT:  Theract:  -STS blocked practice verbal cues for anterior weight shift  -ant ball roll out with remarkable carryover noted to anterior weight shift   NMR:  -B anterior step c visual targets, progressing to reciprocal arm swing   -B lateral step c no visual targets needed, B arm abduction   -B arm abduction trunk rotation (difficulty coordinating)   -seated floor, reach up, horizontal reach      PATIENT EDUCATION: Education details: STS form,  Person educated: Patient Education method:  Customer service manager Education comprehension: verbalized understanding and needs further education     HOME EXERCISE PROGRAM: Access Code: [email or web address] URL: [email or web address] Date: 03/07/2022 Prepared by: [name]  Exercises - Standing Balance in Corner with Eyes Closed  - 1 x daily - 7 x weekly - 3 sets - 10 reps - Standing Near Stance in Corner  - 1 x daily - 7 x weekly - 3 sets - 10 reps - Standing Balance in Corner  - 1 x daily - 7 x weekly - 3 sets - 10 reps - Semi-Tandem Corner Balance With Eyes Open  - 1 x daily - 7 x weekly - 3 sets - 10 reps - Corner Balance Feet Together: Eyes Open With Head Turns  - 1 x daily - 7 x weekly - 3 sets - 10 reps - Seated Long Arc Quad  - 1 x daily - 7 x weekly - 3 sets - 10 reps - Seated Hamstring Stretch  - 1 x daily - 7 x weekly - 3 sets - 10 reps - Sit to Stand  - 1 x daily - 7 x weekly - 3 sets - 10 reps    GOALS: Goals reviewed with patient? Yes   SHORT TERM GOALS: Target date: 03/31/2022   Pt will be independent with initial PD-specific HEP for improved strength, balance, transfers and gait.   Baseline: Not performing HEP due to fear- most moves are in standing position  Goal status: INITIAL   2.  MiniBest to be completed and appropriate STG/LTG written  Baseline: MiniBest started but not completed on eval; MiniBest 12/28 (6/5) Goal status: Met   3.  Pt will improve 5 x STS to less than or equal to 25 seconds without UE support and ability to stand on first attempt to demonstrate improved functional strength and transfer efficiency.    Baseline: 27.03s w/UE support on reps 2-5, multiple attempts to stand each rep  Goal status: INITIAL   4.  Pt will initiate walking program in community w/LRAD for return to PLOF and improved cardiovascular endurance  Baseline: Pt not walking due to fear of falling Goal status: INITIAL     LONG TERM GOALS: Target date: 04/28/2022   Pt will verbalize understanding of  local PD community resources, including fitness post DC.    Baseline:  Goal status: INITIAL   2.  Pt will be independent with final PD-specific HEP for improved strength, balance, transfers and gait. Baseline:  Goal status: INITIAL   3.  Patient will improve MiniBESTest score to >/=19/28 in order to demonstrate a decreased risk for falling.  Baseline: 12/28 Goal status: INITIAL   4.  Pt will improve gait velocity to at least 3.7 ft/s with LRAD for improved gait efficiency    Baseline: 3.41f/s without AD Goal status: INITIAL   5.  Pt will improve 5 x STS to less than  or equal to 15 seconds without UE support to demonstrate improved functional strength and transfer efficiency.    Baseline: 27.03s w/UE support on reps 2-5 Goal status: INITIAL     ASSESSMENT:   CLINICAL IMPRESSION: Patient seen for skilled physical therapy session with emphasis on large amplitude movements, stepping strategies and balance re-training. She is progressing with her ability to coordinate reciprocal movement patterns, but remains with difficulty dual tasking (dual motor or dual cog). Patient ambulating with over exaggerated reciprocal pattern with only initial verbal cues. NuStep with B UE/LE for large amplitude reciprocal coordination completed x10 mins with goal of steps >65. Patient tolerating therapy well, continue POC.     OBJECTIVE IMPAIRMENTS Abnormal gait, decreased activity tolerance, decreased balance, decreased endurance, decreased knowledge of use of DME, decreased mobility, difficulty walking, decreased ROM, and decreased strength.    ACTIVITY LIMITATIONS lifting, standing, squatting, stairs, and transfers   PARTICIPATION LIMITATIONS: cleaning, laundry, driving, and community activity   PERSONAL FACTORS Age, Fitness, Past/current experiences, Time since onset of injury/illness/exacerbation, Transportation, and 1 comorbidity: DM 2  are also affecting patient's functional outcome.    REHAB  POTENTIAL: Good   CLINICAL DECISION MAKING: Stable/uncomplicated   EVALUATION COMPLEXITY: Low   PLAN: PT FREQUENCY: 2x/week   PT DURATION: 8 weeks   PLANNED INTERVENTIONS: Therapeutic exercises, Therapeutic activity, Neuromuscular re-education, Balance training, Gait training, Patient/Family education, Stair training, DME instructions, and Re-evaluation   PLAN FOR NEXT SESSION: begin PWR moves ex, STS practice, stepping strategy re-training, dual tasking   Debbora Dus, PT Debbora Dus, PT, DPT, CBIS  03/09/2022, 1:14 PM

## 2022-03-14 ENCOUNTER — Ambulatory Visit: Payer: Medicare PPO

## 2022-03-14 DIAGNOSIS — M6281 Muscle weakness (generalized): Secondary | ICD-10-CM

## 2022-03-14 DIAGNOSIS — Z9181 History of falling: Secondary | ICD-10-CM

## 2022-03-14 DIAGNOSIS — R2689 Other abnormalities of gait and mobility: Secondary | ICD-10-CM

## 2022-03-14 DIAGNOSIS — R2681 Unsteadiness on feet: Secondary | ICD-10-CM

## 2022-03-14 NOTE — Therapy (Signed)
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   Carotid artery disease 03/21/2017   Cataract    immature    Chest pain summer 2012   Chicken pox    CKD (chronic kidney disease) 10/08/2012   Stage 3, GFR 30-59 ml/min   Diverticulosis    difficulty with colonoscopy 2005   DM (diabetes mellitus), type 2 with renal complications 83/66/2947   Fracture    radial/ulnar 03/2016   GERD (gastroesophageal reflux disease)    Heart murmur    innocent murmur   High cholesterol    Hypertension associated with diabetes 02/22/2017   Hyperuricemia 10/31/2016   Hypothyroidism    s/p radioactive iodine tx for graves in 2001   Intestinal metaplasia of gastric mucosa    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) 03/21/2017   Hypertension associated with diabetes ([place]) 02/22/2017   Hyperlipidemia associated with type 2 diabetes mellitus ([place]) 02/22/2017   Hyperuricemia  10/31/2016   PD (Parkinson's disease) ([place]) 08/08/2016   Loss of weight 07/25/2016   DM (diabetes mellitus), type 2 with renal complications 65/46/5035   CKD (chronic kidney disease) stage 3, GFR 30-59 ml/min ([place]) 10/08/2012   Vitamin D deficiency 08/24/2012   Hypothyroid, s/p radioactive iodine tx for graves disease     REFERRING DIAG: G20 (ICD-10-CM) - Parkinson's disease ([place])   THERAPY DIAG:  Other abnormalities of gait and mobility  Muscle weakness (generalized)  Unsteadiness on feet  History of falling  Rationale for Evaluation and Treatment Rehabilitation  PERTINENT HISTORY: Diabetes mellitus type 2 with hyperglycemia, with long-term current use of insulin, stage 3b chronic kidney disease and hypertension, Diabetic mild nonproliferative retinopathy, both eyes, pressure callus, Dyslipidemia associated with type 2 diabetes mellitus, Hypothyroidism, Thyroid ophthalmopath, Hyperuricemia, Vitamin D deficiency.  PRECAUTIONS: fall  SUBJECTIVE: Patient reports doing well. She denies any falls. She does state that she was feeling weak this weekend, but feels better now.  PAIN:  Are you having pain? No    TODAY'S TREATMENT:  NMR:  -SciFit x10 mins hills level 2 BUE/LE  -modified plantigrade open books 2x8   -ant ball roll out x20   -2x5 STS intermittent UE support- verbal cues for anterior weight shift   -stepping  strategy to colored dots on floor, CGA provided  Gait:   -x3 laps walking poles, ModA for correct reciprocal pattern   -x3 laps c PT forcing reciprocal pattern   -x1 lap with good carryover of reciprocal arm swing     PATIENT EDUCATION: Education details: Exercise progressions,  Person educated: Patient Education method: Customer service manager Education comprehension: verbalized understanding and needs further education     HOME EXERCISE PROGRAM: Access Code: [email or web address] URL: [email or web address] Date: 03/07/2022 Prepared by:  [name]  Exercises - Standing Balance in Corner with Eyes Closed  - 1 x daily - 7 x weekly - 3 sets - 10 reps - Standing Near Stance in Corner  - 1 x daily - 7 x weekly - 3 sets - 10 reps - Standing Balance in Corner  - 1 x daily - 7 x weekly - 3 sets - 10 reps - Semi-Tandem Corner Balance With Eyes Open  - 1 x daily - 7 x weekly - 3 sets - 10 reps - Corner Balance Feet Together: Eyes Open With Head Turns  - 1 x daily - 7 x weekly - 3 sets - 10 reps - Seated Long Arc Quad  - 1 x daily - 7 x weekly - 3 sets - 10 reps - Seated Hamstring Stretch  - 1 x daily - 7 x weekly - 3 sets - 10 reps - Sit to Stand  - 1 x daily - 7 x weekly - 3 sets - 10 reps    GOALS: Goals reviewed with patient? Yes   SHORT TERM GOALS: Target date: 03/31/2022   Pt will be independent with initial PD-specific HEP for improved strength, balance, transfers and gait.   Baseline: Not performing HEP due to fear- most moves are in standing position  Goal status: INITIAL   2.  MiniBest to be completed and appropriate STG/LTG written  Baseline: MiniBest started but not completed on eval; MiniBest 12/28 (6/5) Goal status: Met   3.  Pt will improve 5 x STS to less than or equal to 25 seconds without UE support and ability to stand on first attempt to demonstrate improved functional strength and transfer efficiency.    Baseline: 27.03s w/UE support on reps 2-5, multiple attempts to stand each rep  Goal status: INITIAL   4.  Pt will initiate walking program in community w/LRAD for return to PLOF and improved cardiovascular endurance  Baseline: Pt not walking due to fear of falling Goal status: INITIAL     LONG TERM GOALS: Target date: 04/28/2022   Pt will verbalize understanding of local PD community resources, including fitness post DC.    Baseline:  Goal status: INITIAL   2.  Pt will be independent with final PD-specific HEP for improved strength, balance, transfers and gait. Baseline:  Goal status:  INITIAL   3.  Patient will improve MiniBESTest score to >/=19/28 in order to demonstrate a decreased risk for falling.  Baseline: 12/28 Goal status: INITIAL   4.  Pt will improve gait velocity to at least 3.7 ft/s with LRAD for improved gait efficiency    Baseline: 3.41f/s without AD Goal status: INITIAL   5.  Pt will improve 5 x STS to less than or equal to 15 seconds without UE support to demonstrate improved functional strength and transfer efficiency.    Baseline: 27.03s w/UE support on reps 2-5 Goal status: INITIAL     ASSESSMENT:   CLINICAL IMPRESSION: Patient seen for skilled physical therapy session with  emphasis on large amplitude movements, stepping strategies and balance re-training. Patient tolerating progression of scifit hills with noted good endurance at level 2. Patient noted to have good carryover of reciprocal arm swing exercises with noted improvement in equal stride length, gait speed and overall stability of gait. Patient with improving anterior weight shift, but she does require intermittent verbal/tactile cues to do so. Patient will benefit from continued practice with stepping strategies.     OBJECTIVE IMPAIRMENTS Abnormal gait, decreased activity tolerance, decreased balance, decreased endurance, decreased knowledge of use of DME, decreased mobility, difficulty walking, decreased ROM, and decreased strength.    ACTIVITY LIMITATIONS lifting, standing, squatting, stairs, and transfers   PARTICIPATION LIMITATIONS: cleaning, laundry, driving, and community activity   PERSONAL FACTORS Age, Fitness, Past/current experiences, Time since onset of injury/illness/exacerbation, Transportation, and 1 comorbidity: DM 2  are also affecting patient's functional outcome.    REHAB POTENTIAL: Good   CLINICAL DECISION MAKING: Stable/uncomplicated   EVALUATION COMPLEXITY: Low   PLAN: PT FREQUENCY: 2x/week   PT DURATION: 8 weeks   PLANNED INTERVENTIONS: Therapeutic  exercises, Therapeutic activity, Neuromuscular re-education, Balance training, Gait training, Patient/Family education, Stair training, DME instructions, and Re-evaluation   PLAN FOR NEXT SESSION: begin PWR moves ex, STS practice, stepping strategy re-training, dual tasking   Debbora Dus, PT Debbora Dus, PT, DPT, CBIS  03/14/2022, 12:33 PM

## 2022-03-16 ENCOUNTER — Ambulatory Visit: Payer: Medicare PPO | Admitting: Physical Therapy

## 2022-03-16 DIAGNOSIS — R2681 Unsteadiness on feet: Secondary | ICD-10-CM

## 2022-03-16 DIAGNOSIS — R2689 Other abnormalities of gait and mobility: Secondary | ICD-10-CM

## 2022-03-16 DIAGNOSIS — M6281 Muscle weakness (generalized): Secondary | ICD-10-CM

## 2022-03-16 NOTE — Therapy (Signed)
OUTPATIENT PHYSICAL THERAPY TREATMENT NOTE   Patient Name: Pamela Patton MRN: [id number] DOB:09/14/1946, 75 y.o., female Today's Date: 03/16/2022  PCP: Koberlein, Junell C, MD REFERRING PROVIDER: [name], Rebecca S, DO   END OF SESSION:   PT End of Session - 03/16/22 1108     Visit Number 5    Number of Visits 17    Date for PT Re-Evaluation 05/26/22    Authorization Type [employer]    PT Start Time 1106   Pt arrived late   PT Stop Time 1146    PT Time Calculation (min) 40 min    Equipment Utilized During Treatment --    Activity Tolerance Patient tolerated treatment well    Behavior During Therapy WFL for tasks assessed/performed              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   Carotid artery disease 03/21/2017   Cataract    immature    Chest pain summer 2012   Chicken pox    CKD (chronic kidney disease) 10/08/2012   Stage 3, GFR 30-59 ml/min   Diverticulosis    difficulty with colonoscopy 2005   DM (diabetes mellitus), type 2 with renal complications 10/08/2012   Fracture    radial/ulnar 03/2016   GERD (gastroesophageal reflux disease)    Heart murmur    innocent murmur   High cholesterol    Hypertension associated with diabetes 02/22/2017   Hyperuricemia 10/31/2016   Hypothyroidism    s/p radioactive iodine tx for graves in 2001   Intestinal metaplasia of gastric mucosa    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   Parkinson's disease 08/08/2016   UTI (urinary tract infection)    Vitamin D deficiency 08/24/2012   Weight loss    eval with GI in 2016; s/p CT/MRI and PET scans   Past Surgical History:  Procedure Laterality Date   ABDOMINAL HYSTERECTOMY  1989   CATARACT EXTRACTION, BILATERAL     COLONOSCOPY     DG BARIUM SWALLOW ([facility] HX)     9 yrs ago per pt due to incomplete colonsocopy   Patient Active Problem List   Diagnosis Date Noted   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   Carotid artery disease (HCC) 03/21/2017   Hypertension associated with diabetes (HCC) 02/22/2017   Hyperlipidemia associated with type 2 diabetes mellitus (HCC) 02/22/2017   Hyperuricemia 10/31/2016   PD (Parkinson's disease) (HCC) 08/08/2016   Loss of weight 07/25/2016   DM (diabetes mellitus), type 2 with renal complications 10/08/2012   CKD (chronic kidney disease) stage 3, GFR 30-59 ml/min (HCC) 10/08/2012   Vitamin D deficiency 08/24/2012   Hypothyroid, s/p radioactive iodine tx for graves disease     REFERRING DIAG: G20 (ICD-10-CM) - Parkinson's disease (HCC)   THERAPY DIAG:  Other abnormalities of gait and mobility  Muscle weakness (generalized)  Unsteadiness on feet  Rationale for Evaluation and Treatment Rehabilitation  PERTINENT HISTORY: Diabetes mellitus type 2 with hyperglycemia, with long-term current use of insulin, stage 3b chronic kidney disease and hypertension, Diabetic mild nonproliferative retinopathy, both eyes, pressure callus, Dyslipidemia associated with type 2 diabetes mellitus, Hypothyroidism, Thyroid ophthalmopath, Hyperuricemia, Vitamin D deficiency.  PRECAUTIONS: fall  SUBJECTIVE: Pt reports she is doing well, "I feel pretty good". States her exercises are getting easier and she is excited about that. No new falls.   PAIN:  Are you   having pain? No    TODAY'S TREATMENT:  Ther Ex  Updated HEP (see bolded below) to challenge vestibular system w/EC and uneven surfaces. Pt able to demonstrate each without need for cues and w/distant S*:  - Standing on old pillows with eyes closed and feet close together     - Semi-tandem corner balance with eyes closed    - Corner balance feet together: eyes closed with head turns    - Standing on pillows/dog bed with eyes closed in corner     Gait training  The following treadmill training was completed for aerobic/neural priming, endurance, and gait speed.  - Warmup:  2:00 up to 1.1 mph. Min cues for increased step length  - HIIT: 5:00 30sec ON/OFF alternating ball kicks / normal gait at 1.1 mph . Min cues for high amplitude kicks.  -Cool down: 90 sec at 1.1 mph. Noted increased step length/clearance bilaterally without cues.     PATIENT EDUCATION: Education details: Updates to HEP Person educated: Patient Education method: Customer service manager Education comprehension: verbalized understanding and needs further education     HOME EXERCISE PROGRAM: Access Code: [email or web address] URL: [email or web address] Date: 03/16/2022 Prepared by: Mickie Bail Auriah Hollings  Exercises - Standing on old pillows with eyes closed and feet close together   - 1 x daily - 7 x weekly - 3 sets - 30 second hold - Semi-tandem corner balance with eyes closed   - 1 x daily - 7 x weekly - 3 sets - 30 second hold - Corner balance feet together: eyes closed with head turns   - 1 x daily - 7 x weekly - 3 sets - 30 second hold - Standing on pillows/dog bed with eyes closed in corner   - 1 x daily - 7 x weekly - 3 sets - 30 second hold - Seated Long Arc Quad  - 1 x daily - 7 x weekly - 3 sets - 10 reps - Seated Hamstring Stretch  - 1 x daily - 7 x weekly - 3 sets - 10 reps - Sit to Stand  - 1 x daily - 7 x weekly - 3 sets - 10 reps    GOALS: Goals reviewed with patient? Yes   SHORT TERM GOALS: Target date: 03/31/2022   Pt will be independent with initial PD-specific HEP for improved strength, balance, transfers and gait.   Baseline: Not performing HEP due to fear- most moves are in standing position  Goal status: INITIAL   2.  Pt will improve MiniBest to 15/28 for decreased fall risk and improvement with compensatory stepping strategies.   Baseline: MiniBest started but not completed on eval; MiniBest 12/28 (6/5) Goal status: Met   3.  Pt will improve 5 x STS to less than or equal to 25 seconds without UE support and ability to stand on first attempt to demonstrate  improved functional strength and transfer efficiency.    Baseline: 27.03s w/UE support on reps 2-5, multiple attempts to stand each rep  Goal status: INITIAL   4.  Pt will initiate walking program in community w/LRAD for return to PLOF and improved cardiovascular endurance  Baseline: Pt not walking due to fear of falling Goal status: INITIAL     LONG TERM GOALS: Target date: 04/28/2022   Pt will verbalize understanding of local PD community resources, including fitness post DC.    Baseline:  Goal status: INITIAL   2.  Pt will be independent with final PD-specific HEP for improved strength, balance, transfers  and gait. Baseline:  Goal status: INITIAL   3.  Patient will improve MiniBESTest score to >/=19/28 in order to demonstrate a decreased risk for falling.  Baseline: 12/28 Goal status: INITIAL   4.  Pt will improve gait velocity to at least 3.7 ft/s with LRAD for improved gait efficiency    Baseline: 3.63f/s without AD Goal status: INITIAL   5.  Pt will improve 5 x STS to less than or equal to 15 seconds without UE support to demonstrate improved functional strength and transfer efficiency.    Baseline: 27.03s w/UE support on reps 2-5 Goal status: INITIAL     ASSESSMENT:   CLINICAL IMPRESSION: Emphasis of skilled PT session on updating HEP to incorporate more vestibular input and gait training for increased step length/clearance. Pt reported HEP is too easy, progressed to having her stand on uneven surface with closed eyes for greater challenge. Pt enjoyed treadmill gait training and demonstrated increased step length w/ball kicks. Pt continues to demonstrate difficulty w/sit <>stands, will address next session. Continue POC.     OBJECTIVE IMPAIRMENTS Abnormal gait, decreased activity tolerance, decreased balance, decreased endurance, decreased knowledge of use of DME, decreased mobility, difficulty walking, decreased ROM, and decreased strength.    ACTIVITY LIMITATIONS  lifting, standing, squatting, stairs, and transfers   PARTICIPATION LIMITATIONS: cleaning, laundry, driving, and community activity   PERSONAL FACTORS Age, Fitness, Past/current experiences, Time since onset of injury/illness/exacerbation, Transportation, and 1 comorbidity: DM 2  are also affecting patient's functional outcome.    REHAB POTENTIAL: Good   CLINICAL DECISION MAKING: Stable/uncomplicated   EVALUATION COMPLEXITY: Low   PLAN: PT FREQUENCY: 2x/week   PT DURATION: 8 weeks   PLANNED INTERVENTIONS: Therapeutic exercises, Therapeutic activity, Neuromuscular re-education, Balance training, Gait training, Patient/Family education, Stair training, DME instructions, and Re-evaluation   PLAN FOR NEXT SESSION: begin PWR moves ex, STS practice, stepping strategy re-training, dual tasking, treadmill training, dot drill    JCruzita LedererPlaster, PT, DPT 03/16/2022, 12:33 PM

## 2022-03-17 ENCOUNTER — Ambulatory Visit: Payer: Medicare PPO | Admitting: Licensed Clinical Social Worker

## 2022-03-17 DIAGNOSIS — N183 Chronic kidney disease, stage 3 unspecified: Secondary | ICD-10-CM

## 2022-03-17 DIAGNOSIS — E1159 Type 2 diabetes mellitus with other circulatory complications: Secondary | ICD-10-CM

## 2022-03-17 DIAGNOSIS — Z794 Long term (current) use of insulin: Secondary | ICD-10-CM

## 2022-03-18 NOTE — Chronic Care Management (AMB) (Signed)
Chronic Care Management    Clinical Social Work Note  03/18/2022 Name: Pamela Patton MRN: [id number] DOB: Feb 10, 1946  Pamela Patton is a 76 y.o. year old female who is a primary care patient of Koberlein, Steele Berg, MD (Inactive). The CCM team was consulted to assist the patient with chronic disease management and/or care coordination needs related to:  Stress .   Engaged with patient by telephone for follow up visit in response to provider referral for social work chronic care management and care coordination services.   Consent to Services:  The patient was given information about Chronic Care Management services, agreed to services, and gave verbal consent prior to initiation of services.  Please see initial visit note for detailed documentation.   Patient agreed to services and consent obtained.   Assessment: Review of patient past medical history, allergies, medications, and health status, including review of relevant consultants reports was performed today as part of a comprehensive evaluation and provision of chronic care management and care coordination services.     SDOH (Social Determinants of Health) assessments and interventions performed:    Advanced Directives Status: Not addressed in this encounter.  CCM Care Plan  No Known Allergies  Outpatient Encounter Medications as of 03/17/2022  Medication Sig Note   aspirin 81 MG tablet Take 81 mg by mouth daily with lunch.     atorvastatin (LIPITOR) 40 MG tablet Take 40 mg by mouth daily with lunch.     Blood Pressure Monitoring (BLOOD PRESSURE CUFF) MISC 1 Product by Does not apply route as needed.    Calcium Carbonate-Vitamin D 600-400 MG-UNIT tablet Take 1 tablet by mouth 3 (three) times a week.     carbidopa-levodopa (SINEMET IR) 25-100 MG tablet Take 2 tablets by mouth 3 (three) times daily. 8am/noon/4pm.    Cholecalciferol (VITAMIN D PO) Take 5,000 Units by mouth 3 (three) times a week.     Cyanocobalamin (VITAMIN B12 PO)  Take 5,000 mcg by mouth 2 (two) times a week.    glucose 4 GM chewable tablet Chew 1 tablet by mouth as needed for low blood sugar. 06/30/2020: As needed    glucose blood (ACCU-CHEK AVIVA PLUS) test strip Use to test blood sugar 4 times a day (Dx: E11.65)    HUMALOG KWIKPEN 100 UNIT/ML KiwkPen Inject 0-4 Units into the skin 3 (three) times daily with meals. Sliding scale per Dr Altheimer's office notes from 09/24/2019    hydrochlorothiazide (HYDRODIURIL) 25 MG tablet Take 25 mg by mouth daily with lunch.     insulin glargine (LANTUS) 100 UNIT/ML injection Inject 0.05 mLs (5 Units total) into the skin every evening. (Patient taking differently: Inject 5 Units into the skin daily.)    Insulin Syringe-Needle U-100 (INSULIN SYRINGE .3CC/31GX5/16") 31G X 5/16" 0.3 ML MISC Use as directed for Lantus injection once a day    levothyroxine (SYNTHROID, LEVOTHROID) 75 MCG tablet Take 75 mcg by mouth daily. 12/15/2021: Patient not taking 1 day a week (Sunday)    losartan (COZAAR) 50 MG tablet Take 1 tablet (50 mg total) by mouth daily.    NONFORMULARY OR COMPOUNDED ITEM [facility]:  Antifungal - Terbinafine 3%, Fluconazole 2%, Tea Tree Oil 5%, Urea 10%, Ibuprofen 2% in DMSO Suspension #62m. Apply to the affected nail(s) once (at bedtime) or twice daily.    Nutritional Supplements (FEEDING SUPPLEMENT, GLUCERNA 1.2 CAL,) LIQD Take 237 mLs by mouth daily as needed. 06/30/2020: In place of meal when out at time   [facility]  Lancets 33G MISC Use to test blood sugar 4 times a day (Dx: E11.65)    Pioglitazone HCl (ACTOS PO) Take 45 mg by mouth daily with lunch.     Polyethyl Glycol-Propyl Glycol (SYSTANE) 0.4-0.3 % GEL ophthalmic gel Place 1 application into both eyes at bedtime as needed (FOR EYES).    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   Carotid artery disease ([place]) 03/21/2017   Hypertension associated with diabetes ([place]) 02/22/2017   Hyperlipidemia associated with type 2 diabetes mellitus ([place]) 02/22/2017   Hyperuricemia 10/31/2016   PD (Parkinson's disease) ([place]) 08/08/2016   Loss of weight 07/25/2016   DM (diabetes mellitus), type 2 with renal complications 54/65/6812   CKD (chronic kidney disease) stage 3, GFR 30-59 ml/min (HCC) 10/08/2012   Vitamin D deficiency 08/24/2012   Hypothyroid, s/p radioactive iodine tx for graves disease     Conditions to be addressed/monitored: HTN, DMII, and CKD Stage 3  Care Plan : LCSW Plan of Care  Updates made by Rebekah Chesterfield, LCSW since 03/18/2022 12:00 AM     Problem: Quality of Life (General Plan of Care)      Long-Range Goal: Quality of Life Maintained   Start Date: 11/18/2021  Expected End Date: 05/02/2022  This Visit's Progress: On track  Recent Progress: On track  Priority: Medium  Note:   Current barriers:   Acute Mental Health needs related to Stress Needs Support, Education, and Care Coordination in order to manage psychosocial stressors Clinical Goal(s): explore community resource options for unmet needs related to:  Stress  Clinical Interventions:  Assessed patient's previous and current treatment, coping skills, support system and barriers to care  Patient endorses a decrease in stress with improved sleep Patient is managing insulin better due to keeping notes of "what I'm doing and should be doing" Utilizing alarms on phone Commended pt on being intentional on meeting her health goals and staying motivated PT has been going well, patient continues to complete exercises in the home CCM LCSW provided validation and encouragement. Self-care strategies identified to promote relaxation and positive mood Patient has improved on checking blood sugars and managing health conditions Patient receives strong support  from family and friends through [place] or Relaxation training provided Active listening / Reflection utilized  Emotional Support Provided Problem [place] strategies reviewed Provided psychoeducation for mental health needs  Verbalization of feelings encouraged   1:1 collaboration with primary care provider regarding development and update of comprehensive plan of care as evidenced by provider attestation and co-signature Inter-disciplinary care team collaboration (see longitudinal plan of care) Patient Goals/Self-Care Activities: Over the next 120 days Attend all scheduled medical appointments Utilize healthy coping skills and/or supportive resources discussed Contact PCP office with any questions or concerns        Follow Up Plan: SW will follow up with patient by phone over the next 4-6 weeks      Christa See, MSW, [facility] [email or web address] Phone [phone] 12:35 PM

## 2022-03-18 NOTE — Patient Instructions (Signed)
Visit Information  Thank you for taking time to visit with me today. Please don't hesitate to contact me if I can be of assistance to you before our next scheduled telephone appointment.  Following are the goals we discussed today:  Patient Goals/Self-Care Activities: Over the next 120 days Attend all scheduled medical appointments Utilize healthy coping skills and/or supportive resources discussed Contact PCP office with any questions or concerns  Our next appointment is by telephone on 05/12/22 at 1:15 PM  Please call the care guide team at [phone] if you need to cancel or reschedule your appointment.   If you are experiencing a Mental Health or [place] or need someone to talk to, please call [phone]   Patient verbalizes understanding of instructions and care plan provided today and agrees to view in [place]. Active MyChart status and patient understanding of how to access instructions and care plan via MyChart confirmed with patient.     Christa See, MSW, [facility] [email or web address] Phone [phone] 12:36 PM

## 2022-03-21 ENCOUNTER — Ambulatory Visit: Payer: Medicare PPO | Admitting: Physical Therapy

## 2022-03-21 ENCOUNTER — Encounter: Payer: Self-pay | Admitting: Physical Therapy

## 2022-03-21 DIAGNOSIS — M6281 Muscle weakness (generalized): Secondary | ICD-10-CM

## 2022-03-21 DIAGNOSIS — Z9181 History of falling: Secondary | ICD-10-CM

## 2022-03-21 DIAGNOSIS — R2689 Other abnormalities of gait and mobility: Secondary | ICD-10-CM

## 2022-03-21 DIAGNOSIS — R2681 Unsteadiness on feet: Secondary | ICD-10-CM

## 2022-03-21 NOTE — Therapy (Signed)
OUTPATIENT PHYSICAL THERAPY TREATMENT NOTE   Patient Name: Pamela Patton MRN: [id number] DOB:28-Mar-1946, 76 y.o., female Today's Date: 03/21/2022  PCP: Caren Macadam, MD REFERRING PROVIDER: Ludwig Clarks, DO   END OF SESSION:   PT End of Session - 03/21/22 1106     Visit Number 6    Number of Visits 17    Date for PT Re-Evaluation 05/26/22    Authorization Type [employer]    PT Start Time 1104    PT Stop Time 1144    PT Time Calculation (min) 40 min    Equipment Utilized During Treatment Gait belt    Activity Tolerance Patient tolerated treatment well    Behavior During Therapy WFL for tasks assessed/performed              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   Carotid artery disease 03/21/2017   Cataract    immature    Chest pain summer 2012   Chicken pox    CKD (chronic kidney disease) 10/08/2012   Stage 3, GFR 30-59 ml/min   Diverticulosis    difficulty with colonoscopy 2005   DM (diabetes mellitus), type 2 with renal complications 48/88/9169   Fracture    radial/ulnar 03/2016   GERD (gastroesophageal reflux disease)    Heart murmur    innocent murmur   High cholesterol    Hypertension associated with diabetes 02/22/2017   Hyperuricemia 10/31/2016   Hypothyroidism    s/p radioactive iodine tx for graves in 2001   Intestinal metaplasia of gastric mucosa    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   Parkinson's disease 08/08/2016   UTI (urinary tract infection)    Vitamin D deficiency 08/24/2012   Weight loss    eval with GI in 2016; s/p CT/MRI and PET scans   Past Surgical History:  Procedure Laterality Date   ABDOMINAL HYSTERECTOMY  1989   CATARACT EXTRACTION, BILATERAL     COLONOSCOPY     DG BARIUM SWALLOW ([place] [facility])     9 yrs ago per pt due to incomplete colonsocopy   Patient Active Problem List   Diagnosis Date Noted   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   Benign  neoplasm of kidney 04/06/2017   Carotid artery disease ([place]) 03/21/2017   Hypertension associated with diabetes ([place]) 02/22/2017   Hyperlipidemia associated with type 2 diabetes mellitus ([place]) 02/22/2017   Hyperuricemia 10/31/2016   PD (Parkinson's disease) ([place]) 08/08/2016   Loss of weight 07/25/2016   DM (diabetes mellitus), type 2 with renal complications 45/12/8880   CKD (chronic kidney disease) stage 3, GFR 30-59 ml/min ([place]) 10/08/2012   Vitamin D deficiency 08/24/2012   Hypothyroid, s/p radioactive iodine tx for graves disease     REFERRING DIAG: G20 (ICD-10-CM) - Parkinson's disease ([place])   THERAPY DIAG:  Other abnormalities of gait and mobility  Muscle weakness (generalized)  Unsteadiness on feet  History of falling  Rationale for Evaluation and Treatment Rehabilitation  PERTINENT HISTORY: Diabetes mellitus type 2 with hyperglycemia, with long-term current use of insulin, stage 3b chronic kidney disease and hypertension, Diabetic mild nonproliferative retinopathy, both eyes, pressure callus, Dyslipidemia associated with type 2 diabetes mellitus, Hypothyroidism, Thyroid ophthalmopath, Hyperuricemia, Vitamin D deficiency.  PRECAUTIONS: fall  SUBJECTIVE: No recent falls and her exercises are going pretty well.  She has tried walking on the pillow and the pillow that she has is kind of bouncy.  She  has done a few leg stretches to reduce posterior right knee pinching with some relief.  PAIN:  Are you having pain? No    TODAY'S TREATMENT:  NMR: -STS x3 > STS w/ forward reach to chair, cued for palms flat x8 > practiced forward reach 50% to floor w/ pt able to stand w/o UE support x8 fluidly. -Dual task: side stepping w/ ball rebounder at wall 6x15', vertical ball toss w/ ambulation x100'-pt demonstrates inc difficulty w/ walking and manual task performing brief stopping while tossing ball up.   -Dot drills:  PT calls foot and color dot oriented in semi-circle at front of pt  progressed to multiple colors for added dual task component>4-way dot stepping w/ notable dec posterior step  THEREX: Treadmill training completed x8 mins progressing to 1.3 mph and 3% incline with mod cues and facilitation to maintain forward weight shift, approximation to front of treadmill and inc step size.  Performed for improved heel strike, endurance, and anterior weight shifting during dynamic movement.    PATIENT EDUCATION: Education details: Continue HEP. Person educated: Patient Education method: Customer service manager Education comprehension: verbalized understanding and needs further education     HOME EXERCISE PROGRAM: Access Code: [email or web address] URL: [email or web address] Date: 03/16/2022 Prepared by: Mickie Bail Plaster  Exercises - Standing on old pillows with eyes closed and feet close together   - 1 x daily - 7 x weekly - 3 sets - 30 second hold - Semi-tandem corner balance with eyes closed   - 1 x daily - 7 x weekly - 3 sets - 30 second hold - Corner balance feet together: eyes closed with head turns   - 1 x daily - 7 x weekly - 3 sets - 30 second hold - Standing on pillows/dog bed with eyes closed in corner   - 1 x daily - 7 x weekly - 3 sets - 30 second hold - Seated Long Arc Quad  - 1 x daily - 7 x weekly - 3 sets - 10 reps - Seated Hamstring Stretch  - 1 x daily - 7 x weekly - 3 sets - 10 reps - Sit to Stand  - 1 x daily - 7 x weekly - 3 sets - 10 reps    GOALS: Goals reviewed with patient? Yes   SHORT TERM GOALS: Target date: 03/31/2022   Pt will be independent with initial PD-specific HEP for improved strength, balance, transfers and gait.   Baseline: Not performing HEP due to fear- most moves are in standing position  Goal status: INITIAL   2.  Pt will improve MiniBest to 15/28 for decreased fall risk and improvement with compensatory stepping strategies.   Baseline: MiniBest started but not completed on eval; MiniBest 12/28 (6/5) Goal  status: Met   3.  Pt will improve 5 x STS to less than or equal to 25 seconds without UE support and ability to stand on first attempt to demonstrate improved functional strength and transfer efficiency.    Baseline: 27.03s w/UE support on reps 2-5, multiple attempts to stand each rep  Goal status: INITIAL   4.  Pt will initiate walking program in community w/LRAD for return to PLOF and improved cardiovascular endurance  Baseline: Pt not walking due to fear of falling Goal status: INITIAL     LONG TERM GOALS: Target date: 04/28/2022   Pt will verbalize understanding of local PD community resources, including fitness post DC.    Baseline:  Goal status: INITIAL   2.  Pt will be independent with final PD-specific HEP for improved strength, balance, transfers and gait. Baseline:  Goal status: INITIAL   3.  Patient will improve MiniBESTest score to >/=19/28 in order to demonstrate a decreased risk for falling.  Baseline: 12/28 Goal status: INITIAL   4.  Pt will improve gait velocity to at least 3.7 ft/s with LRAD for improved gait efficiency    Baseline: 3.45f/s without AD Goal status: INITIAL   5.  Pt will improve 5 x STS to less than or equal to 15 seconds without UE support to demonstrate improved functional strength and transfer efficiency.    Baseline: 27.03s w/UE support on reps 2-5 Goal status: INITIAL     ASSESSMENT:   CLINICAL IMPRESSION: Today's skilled session targeted at continuing to progress gait mechanics and BLE strengthening with treadmill training.  Further progressed dual tasking with dynamic activity and general stepping strategy.  Pt demonstrates good carryover from session prior in regards to standing transfers.  Will continue to progress as able per POC.    OBJECTIVE IMPAIRMENTS Abnormal gait, decreased activity tolerance, decreased balance, decreased endurance, decreased knowledge of use of DME, decreased mobility, difficulty walking, decreased ROM, and  decreased strength.    ACTIVITY LIMITATIONS lifting, standing, squatting, stairs, and transfers   PARTICIPATION LIMITATIONS: cleaning, laundry, driving, and community activity   PERSONAL FACTORS Age, Fitness, Past/current experiences, Time since onset of injury/illness/exacerbation, Transportation, and 1 comorbidity: DM 2  are also affecting patient's functional outcome.    REHAB POTENTIAL: Good   CLINICAL DECISION MAKING: Stable/uncomplicated   EVALUATION COMPLEXITY: Low   PLAN: PT FREQUENCY: 2x/week   PT DURATION: 8 weeks   PLANNED INTERVENTIONS: Therapeutic exercises, Therapeutic activity, Neuromuscular re-education, Balance training, Gait training, Patient/Family education, Stair training, DME instructions, and Re-evaluation   PLAN FOR NEXT SESSION: begin PWR moves ex, STS practice, stepping strategy re-training, dual tasking, treadmill training, dot drills, resisted walking    MBary Richard PT, DPT 03/21/2022, 2:16 PM

## 2022-03-22 ENCOUNTER — Telehealth: Payer: Self-pay | Admitting: Pharmacist

## 2022-03-22 NOTE — Chronic Care Management (AMB) (Cosign Needed)
    Chronic Care Management Pharmacy Assistant   Name: TEYLOR WOLVEN  MRN: [id number] DOB: [birth year]  03/23/2022 APPOINTMENT REMINDER  [name] was reminded to have all medications, supplements and any blood glucose and blood pressure readings available for review with Jeni Salles, Pharm. D, at her telephone visit on 03/23/2022 at 3:00.  Care Gaps: AWV - scheduled 01/02/2023 Last BP - 138/58 on 02/21/2022 Last A1C - 9.4 on 01/26/2022 HGA1C - overdue Foot exam - overdue Colonoscopy - overdue Eye exam - overdue  Star Rating Drug: Atorvastatin 40 mg - last filled 03/21/2022 16 DS DS at [facility] Losartan 50 mg - last filled 03/08/2022 44 DS at [facility] Actos 45 mg - last filled 10/21/2021 90 DS at [facility] - onboarding with [facility]  Any gaps in medications fill history? No  Gennie Alma [facility]  Catering manager [phone]

## 2022-03-23 ENCOUNTER — Ambulatory Visit: Payer: Medicare PPO | Admitting: Physical Therapy

## 2022-03-23 ENCOUNTER — Ambulatory Visit: Payer: Medicare PPO | Admitting: Pharmacist

## 2022-03-23 DIAGNOSIS — R2689 Other abnormalities of gait and mobility: Secondary | ICD-10-CM

## 2022-03-23 DIAGNOSIS — M6281 Muscle weakness (generalized): Secondary | ICD-10-CM

## 2022-03-23 DIAGNOSIS — E1159 Type 2 diabetes mellitus with other circulatory complications: Secondary | ICD-10-CM

## 2022-03-23 DIAGNOSIS — R2681 Unsteadiness on feet: Secondary | ICD-10-CM

## 2022-03-23 DIAGNOSIS — Z794 Long term (current) use of insulin: Secondary | ICD-10-CM

## 2022-03-23 NOTE — Progress Notes (Signed)
Chronic Care Management Pharmacy Note  03/23/2022 Name:  Pamela Patton MRN:  [id number] DOB:  [birth year]  Summary: A1c not at goal < 8% Pt is having difficulty managing medications  Recommendations/Changes made from today's visit: -Recommended CGM for A1c lowering -Recommended discussing SGLT2 inhibitor with endocrinology -Recommend scheduling repeat DEXA -Requested refills of acc-chek aviva lancets and test strips  Plan: Follow up to schedule DEXA Follow up in 4 months  Subjective: Pamela Patton is an 76 y.o. year old female who is a primary patient of Koberlein, Steele Berg, MD (Inactive).  The CCM team was consulted for assistance with disease management and care coordination needs.    Engaged with patient by telephone for follow up visit in response to provider referral for pharmacy case management and/or care coordination services.   Consent to Services:  The patient was given information about Chronic Care Management services, agreed to services, and gave verbal consent prior to initiation of services.  Please see initial visit note for detailed documentation.   Patient Care Team: Caren Macadam, MD (Inactive) as PCP - General (Family Medicine) Lorretta Harp, MD as PCP - Cardiology (Cardiology) Ardis Hughs, MD as Attending Physician (Urology) Armbruster, Carlota Raspberry, MD as Consulting Physician (Gastroenterology) Altheimer, Legrand Como, MD as Referring Physician (Endocrinology) [name], Eustace Quail, DO as Consulting Physician (Neurology) Rebekah Chesterfield, LCSW as Social Worker (Licensed Clinical Social Worker) Dimitri Ped, RN as Case Manager Viona Gilmore, [facility] as Pharmacist (Pharmacist)  Recent office visits: 12/29/21 [name], LPN: Patient presented for AWV.  11/01/2021 Micheline Rough MD - Patient was seen for preventative health care and additional issues. Changed Lantus to 5 units every evening. Discontinued Mirtazapine. No follow up noted.    Recent consult visits: 03/21/22 Elease Etienne, PT (outpatient rehab): Patient presented for PT treatment for gait and mobility.  03/07/2022 [name] PT - Patient was seen for Other abnormalities of gait and mobility and unsteadiness on feet. Follow up is 2 times per week for 8 weeks starting 03/03/2022.   02/21/2022 [name] DO (neurology) - Patient was seen for parkinson's disease. Referral to Physical Therapy.  No medication changes. No follow up noted.    01/28/2022 Daine Floras MD (endocrinology) - Patient was seen for Diabetes mellitus type 2 with hyperglycemia, with long-term current use of insulin and additional issues. Increase Humalog to 3 units 10 minutes before each meal plus correction scale: Glucose Units Below 100 = 0 units  101 - 120 = 1 unit  121 - 150 = 2 units  151 - 200 = 3 units  201 - 250 = 4 units  251 - 300 = 5 units  over 300 = 6 units Follow up in 3 months  Hospital visits: None in previous 6 months   Objective:  Lab Results  Component Value Date   CREATININE 1.53 (H) 08/14/2020   BUN 31 (H) 08/14/2020   GFR 49.31 (L) 09/11/2018   GFRNONAA 35 (L) 08/14/2020   GFRAA 43 (L) 11/28/2017   NA 142 08/14/2020   K 4.3 08/14/2020   CALCIUM 9.4 08/14/2020   CO2 23 08/14/2020   GLUCOSE 299 (H) 08/14/2020    Lab Results  Component Value Date/Time   HGBA1C 8.1 (H) 09/11/2018 02:19 PM   HGBA1C 7.3 05/22/2018 12:00 AM   HGBA1C 7.4 02/08/2018 12:00 AM   GFR 49.31 (L) 09/11/2018 02:19 PM   GFR 55.52 (L) 01/16/2018 12:05 PM   MICROALBUR 1.1 06/17/2014 09:52 AM  Last diabetic Eye exam:  Lab Results  Component Value Date/Time   HMDIABEYEEXA Retinopathy (A) 09/10/2020 12:00 AM    Last diabetic Foot exam:  Lab Results  Component Value Date/Time   HMDIABFOOTEX good 06/17/2014 12:00 AM     Lab Results  Component Value Date   CHOL 152 08/11/2020   HDL 72 08/11/2020   LDLCALC 70 08/11/2020   TRIG 47 08/11/2020   CHOLHDL 2.1 08/11/2020        Latest Ref Rng & Units 08/11/2020   11:16 AM 04/27/2017   12:00 AM 06/03/2016    2:44 PM  Hepatic Function  Total Protein 6.0 - 8.5 g/dL 6.9   7.8   Albumin 3.7 - 4.7 g/dL 4.5   4.7   AST 0 - 40 IU/L _0 ALT 0 - 32 IU/L _1 Alk Phosphatase 44 - 121 IU/L 73  55     58   Total Bilirubin 0.0 - 1.2 mg/dL 0.5   0.6   Bilirubin, Direct 0.00 - 0.40 mg/dL 0.16        This result is from an external source.    Lab Results  Component Value Date/Time   TSH 0.30 (A) 04/27/2017 12:00 AM   TSH 0.58 04/02/2015 10:02 AM   TSH 0.44 06/17/2014 09:52 AM   FREET4 1.21 04/19/2010 03:09 AM       Latest Ref Rng & Units 08/14/2020    9:44 PM 09/11/2018    2:19 PM 05/08/2018   11:04 AM  CBC  WBC 4.0 - 10.5 K/uL 9.5  4.6  6.1   Hemoglobin 12.0 - 15.0 g/dL 9.9  10.3  11.1   Hematocrit 36.0 - 46.0 % 32.2  31.3  33.9   Platelets 150 - 400 K/uL 272  204.0  240.0     No results found for: "VD25OH"  Clinical ASCVD: Yes  The 10-year ASCVD risk score (Arnett DK, et al., 2019) is: 38%   Values used to calculate the score:     Age: 21 years     Sex: Female     Is Non-Hispanic African American: Yes     Diabetic: Yes     Tobacco smoker: No     Systolic Blood Pressure: 975 mmHg     Is BP treated: Yes     HDL Cholesterol: 76 MG/DL     Total Cholesterol: 164 MG/DL       12/29/2021    1:31 PM 11/15/2021   10:17 AM 11/01/2021   11:13 AM  Depression screen PHQ 2/9  Decreased Interest 0 1 1  Down, Depressed, Hopeless 0 1 1  PHQ - 2 Score 0 2 2  Altered sleeping 0 1 1  Tired, decreased energy 0 1 2  Change in appetite 0 1 1  Feeling bad or failure about yourself  0 0 0  Trouble concentrating 0 1 1  Moving slowly or fidgety/restless 0 1 1  Suicidal thoughts 0 0 0  PHQ-9 Score 0 7 8      Social History   Tobacco Use  Smoking Status Never  Smokeless Tobacco Never   BP Readings from Last 3 Encounters:  02/21/22 (!) 138/58  11/01/21 136/78  08/13/21 118/60    Pulse Readings from Last 3 Encounters:  02/21/22 72  11/01/21 86  08/13/21 64   Wt Readings from Last 3 Encounters:  02/21/22 131 lb (59.4 kg)  12/29/21 131 lb (59.4 kg)  11/01/21 131 lb 11.2 oz (59.7 kg)   BMI Readings from Last 3 Encounters:  02/21/22 21.14 kg/m  12/29/21 21.14 kg/m  11/01/21 21.26 kg/m    Assessment/Interventions: Review of patient past medical history, allergies, medications, health status, including review of consultants reports, laboratory and other test data, was performed as part of comprehensive evaluation and provision of chronic care management services.   SDOH:  (Social Determinants of Health) assessments and interventions performed: Yes   SDOH Screenings   Alcohol Screen: Low Risk  (12/29/2021)   Alcohol Screen    Last Alcohol Screening Score (AUDIT): 0  Depression (PHQ2-9): Low Risk  (12/29/2021)   Depression (PHQ2-9)    PHQ-2 Score: 0  Recent Concern: Depression (PHQ2-9) - Medium Risk (11/15/2021)   Depression (PHQ2-9)    PHQ-2 Score: 7  Financial Resource Strain: Low Risk  (12/29/2021)   Overall Financial Resource Strain (CARDIA)    Difficulty of Paying Living Expenses: Not hard at all  Food Insecurity: No Food Insecurity (12/29/2021)   Hunger Vital Sign    Worried About Running Out of Food in the Last Year: Never true    Ran Out of Food in the Last Year: Never true  Housing: Low Risk  (12/29/2021)   Housing    Last Housing Risk Score: 0  Physical Activity: Insufficiently Active (12/29/2021)   Exercise Vital Sign    Days of Exercise per Week: 4 days    Minutes of Exercise per Session: 30 min  Social Connections: Moderately Integrated (12/29/2021)   Social Connection and Isolation Panel [NHANES]    Frequency of Communication with Friends and Family: More than three times a week    Frequency of Social Gatherings with Friends and Family: More than three times a week    Attends Religious Services: More than 4 times per year    Active  Member of Clubs or Organizations: Yes    Attends Archivist Meetings: More than 4 times per year    Marital Status: Never married  Stress: No Stress Concern Present (12/29/2021)   [employer] [place]    Feeling of Stress : Not at all  Tobacco Use: Low Risk  (03/21/2022)   Patient History    Smoking Tobacco Use: Never    Smokeless Tobacco Use: Never    Passive Exposure: Not on file  Transportation Needs: No Transportation Needs (12/29/2021)   PRAPARE - Transportation    Lack of Transportation (Medical): No    Lack of Transportation (Non-Medical): No    CCM Care Plan  No Known Allergies  Medications Reviewed Today     Reviewed by Plaster, Cruzita Lederer, PT (Physical Therapist) on 03/23/22 at 1235  Med List Status: <None>   Medication Order Taking? Sig Documenting Provider Last Dose Status Informant  0.9 %  sodium chloride infusion [id number]   Armbruster, Carlota Raspberry, MD  Active   aspirin 81 MG tablet [id number] No Take 81 mg by mouth daily with lunch.  [provider] Taking Active Self  atorvastatin (LIPITOR) 40 MG tablet [id number] No Take 40 mg by mouth daily with lunch.  [provider] Taking Active Self  Blood Pressure Monitoring (BLOOD PRESSURE CUFF) MISC [id number] No 1 Product by Does not apply route as needed. Lucretia Kern, DO Taking Active   Calcium Carbonate-Vitamin D 600-400 MG-UNIT tablet [id number] No Take 1 tablet by mouth 3 (three) times a week.  [provider] Taking Active  Self  carbidopa-levodopa (SINEMET IR) 25-100 MG tablet [id number]  Take 2 tablets by mouth 3 (three) times daily. 8am/noon/4pm. Ludwig Clarks, DO  Active   Cholecalciferol (VITAMIN D PO) [id number] No Take 5,000 Units by mouth 3 (three) times a week.  [provider] Taking Active Self  Cyanocobalamin (VITAMIN B12 PO) [id number] No Take 5,000 mcg by mouth 2 (two) times a week. [provider] Taking  Active   glucose 4 GM chewable tablet [id number] No Chew 1 tablet by mouth as needed for low blood sugar. [provider] Taking Active Self           Med Note Willette Brace   Tue Jun 30, 2020 11:16 AM) As needed   glucose blood (ACCU-CHEK AVIVA PLUS) test strip [id number] No Use to test blood sugar 4 times a day (Dx: E11.65) [provider] Taking Active   HUMALOG KWIKPEN 100 UNIT/ML Mayer Masker [id number] No Inject 0-4 Units into the skin 3 (three) times daily with meals. Sliding scale per Dr Altheimer's office notes from 09/24/2019 [provider] Taking Active Self           Med Note (DAVIS, SOPHIA A   Thu Mar 22, 2018 10:05 AM)    hydrochlorothiazide (HYDRODIURIL) 25 MG tablet [id number] No Take 25 mg by mouth daily with lunch.  [provider] Taking Active Self  insulin glargine (LANTUS) 100 UNIT/ML injection [id number] No Inject 0.05 mLs (5 Units total) into the skin every evening.  Patient taking differently: Inject 5 Units into the skin daily.   Caren Macadam, MD Taking Active   Insulin Syringe-Needle U-100 (INSULIN SYRINGE .3CC/31GX5/16") 31G X 5/16" 0.3 ML MISC [id number] No Use as directed for Lantus injection once a day [provider] Taking Active   levothyroxine (SYNTHROID, LEVOTHROID) 75 MCG tablet [id number] No Take 75 mcg by mouth daily. [provider] Taking Active Self           Med Note Kipp Brood, Koron Godeaux G   Wed Dec 15, 2021 10:11 AM) Patient not taking 1 day a week (Sunday)   losartan (COZAAR) 50 MG tablet [id number] No Take 1 tablet (50 mg total) by mouth daily. Caren Macadam, MD Taking Active   NONFORMULARY OR COMPOUNDED ITEM [id number] No [facility]:  Antifungal - Terbinafine 3%, Fluconazole 2%, Tea Tree Oil 5%, Urea 10%, Ibuprofen 2% in DMSO Suspension #46m. Apply to the affected nail(s) once (at bedtime) or twice daily. GMarzetta Board [name] Taking Active   Nutritional Supplements (FEEDING SUPPLEMENT,  GLUCERNA 1.2 CAL,) LIQD [id number] Take 237 mLs by mouth daily as needed. [provider] Taking Active Self           Med Note (Willette Brace  Tue Jun 30, 2020 11:19 AM) In place of meal when out at time  [facility] HospitalLancets 362VMISC [id number] Use to test blood sugar 4 times a day (Dx: E11.65) [provider] Taking Active   Pioglitazone HCl (ACTOS PO) [id number] Take 45 mg by mouth daily with lunch.  [provider] Taking Active Self  Polyethyl Glycol-Propyl Glycol (SYSTANE) 0.4-0.3 % GEL ophthalmic gel [id number] Place 1 application into both eyes at bedtime as needed (FOR EYES). 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   Carotid artery  disease ([place]) 03/21/2017   Hypertension associated with diabetes ([place]) 02/22/2017   Hyperlipidemia associated with type 2 diabetes mellitus ([place]) 02/22/2017   Hyperuricemia 10/31/2016   PD (Parkinson's disease) ([place]) 08/08/2016   Loss of weight 07/25/2016   DM (diabetes mellitus), type 2 with renal complications 16/07/9603   CKD (chronic kidney disease) stage 3, GFR 30-59 ml/min (HCC) 10/08/2012   Vitamin D deficiency 08/24/2012   Hypothyroid, s/p radioactive iodine tx for graves disease     Immunization History  Administered Date(s) Administered   Influenza Whole 07/03/2012   Influenza, High Dose Seasonal PF 08/01/2017, 08/23/2018, 06/21/2019   Influenza-Unspecified 07/04/2015, 07/04/2016, 07/03/2017, 08/21/2018, 06/21/2019, 07/03/2021   PFIZER(Purple Top)SARS-COV-2 Vaccination 10/23/2019, 11/11/2019, 08/02/2020   PNEUMOCOCCAL CONJUGATE-20 11/01/2021   Pfizer Covid-19 Vaccine Bivalent Booster 91yr & up 09/20/2021   Pneumococcal Conjugate-13 06/17/2014   Pneumococcal Polysaccharide-23 10/03/2009, 02/01/2011   Tdap 03/03/2009, 03/12/2016   Zoster, Live  01/31/2010   Patient reports her days are busy and it is harder for her to organize everything including her insulin and her testing schedule.  Patient has been going to physical therapy and has been doing the exercises at home as well. She said PT upped the exercises for her today when she told them she could handle more.  Patient reports her BP cuff is not the right size as it is falling off her arm. She needs to get a new one.  Conditions to be addressed/monitored:  Hypertension, Hyperlipidemia, Diabetes, Chronic Kidney Disease, Hypothyroidism, and Parkinson's disease  Conditions addressed this visit: Hypertension, diabetes  Care Plan : CCM Pharmacy Care Plan  Updates made by PViona Gilmore REden Prairies[place] 03/23/2022 12:00 AM     Problem: Problem: Hypertension, Hyperlipidemia, Diabetes, Chronic Kidney Disease, Hypothyroidism, and Parkinson's disease      Long-Range Goal: Patient-Specific Goal   Start Date: 12/15/2021  Expected End Date: 12/16/2022  Recent Progress: On track  Priority: High  Note:   Current Barriers:  Unable to independently monitor therapeutic efficacy Unable to achieve control of diabetes   Pharmacist Clinical Goal(s):  Patient will achieve adherence to monitoring guidelines and medication adherence to achieve therapeutic efficacy achieve control of diabetes as evidenced by A1c  through collaboration with PharmD and provider.   Interventions: 1:1 collaboration with KCaren Macadam MD regarding development and update of comprehensive plan of care as evidenced by provider attestation and co-signature Inter-disciplinary care team collaboration (see longitudinal plan of care) Comprehensive medication review performed; medication list updated in electronic medical record  Hypertension (BP goal <130/80) -Not ideally controlled -Current treatment: Hydrochlorothiazide 25 mg 1 tablet daily - Appropriate, Effective, Safe, Accessible Losartan 50 mg 1 tablet daily  - Appropriate, Effective, Safe, Accessible -Medications previously tried: unknown  -Current home readings: has an arm cuff -Current dietary habits: cooks with salt a little bit; eats instant oatmeal for breakfast; recommended low sodium salt; mostly cooks at home -Current exercise habits: using the bike pedal daily -Denies hypotensive/hypertensive symptoms -Educated on BP goals and benefits of medications for prevention of heart attack, stroke and kidney damage; Exercise goal of 150 minutes per week; Importance of home blood pressure monitoring; Proper BP monitoring technique; -Counseled to monitor BP at home weekly, document, and provide log at future appointments -Counseled on diet and exercise extensively Recommended to continue current medication  Hyperlipidemia: (LDL goal < 70) -Controlled (10/28/21 LDL=61) -Current treatment: Atorvastatin 40 mg 1 tablet daily - Appropriate, Effective, Safe, Accessible -Medications previously tried: none  -Current dietary patterns: not eating out much -  Current exercise habits: using the foot pedal almost every day -Educated on Cholesterol goals;  Benefits of statin for ASCVD risk reduction; Importance of limiting foods high in cholesterol; -Counseled on diet and exercise extensively Recommended to continue current medication  Diabetes (A1c goal <8%) -Uncontrolled -Current medications: Humalog 100 units/mL inject based on a sliding scale - Appropriate, Query effective, Safe, Accessible Lantus 100 units/mL inject 5 units daily - Appropriate, Query effective, Safe, Accessible Pioglitazone 45 mg 1 tablet daily - Appropriate, Query effective, Safe, Accessible -Medications previously tried: metformin (GI side effects, muscle spasms)  -Current home glucose readings: usually 2-3 times a day fasting glucose: highest was 213, 140, 151, 97, 161, 147, 142, 154  post prandial glucose: n/a -Denies hypoglycemic/hyperglycemic symptoms -Current meal  patterns:  breakfast: did not discuss  lunch: did not discuss  dinner: did not discuss snacks: did not discuss drinks: did not discuss -Current exercise: not using the foot pedal as often -Educated on A1c and blood sugar goals; Exercise goal of 150 minutes per week; Proper insulin injection technique; Benefits of routine self-monitoring of blood sugar; Continuous glucose monitoring; Carbohydrate counting and/or plate method -Counseled to check feet daily and get yearly eye exams -Counseled on diet and exercise extensively Recommended discussing SGLT2 medications ([place] or [place]) with endocrinologist.  Osteoporosis (Goal prevent fractures) -Uncontrolled -Last DEXA Scan: 07/2017   T-Score femoral neck: -2.6  T-Score total hip: n/a  T-Score lumbar spine: -2.0  T-Score forearm radius: n/a  10-year probability of major osteoporotic fracture: n/a  10-year probability of hip fracture: n/a -Patient is a candidate for pharmacologic treatment due to T-Score < -2.5 in femoral neck -Current treatment  Calcium carbonate 600-400 units 1 tablet three times a week - Appropriate, Effective, Safe, Accessible Vitamin D 5000 units 1 tablet three times a week - Appropriate, Effective, Safe, Accessible -Medications previously tried: none  -Recommend [phone] units of vitamin D daily. Recommend 1200 mg of calcium daily from dietary and supplemental sources. Recommend weight-bearing and muscle strengthening exercises for building and maintaining bone density. -Recommended switching to calcium citrate for better absorption.  Hypothyroidism (Goal: 2.5-4.5) -Uncontrolled -Current treatment  Synthroid 75 mcg 1 tablet daily except skip Sundays - Appropriate, Query effective, Safe, Accessible -Medications previously tried: none  -Recommended to continue current medication Counseled on importance of taking on an empty stomach around the same time each morning.  Chronic kidney disease (Goal: minimize  progression of CKD) -Controlled -Current treatment  Losartan 50 mg 1 tablet daily - Appropriate, Effective, Safe, Accessible -Medications previously tried: none  -Recommended for patient to discuss [place] or [place] with endocrinologist.  Parkinson's disease (Goal: minimize symptoms) -Controlled -Current treatment  Carbidopa-levodopa 25-100 mg 2 tablets three times daily - Appropriate, Effective, Safe, Accessible -Medications previously tried: none  -Recommended to continue current medication Counseled on importance of taking on empty stomach.  Health Maintenance -Vaccine gaps: shingrix -Current therapy:  Aspirin 81 mg 1 tablet daily (carotid artery disease) Systane eye drops as needed Vitamin B12 daily -Educated on Cost vs benefit of each product must be carefully weighed by individual consumer -Patient is satisfied with current therapy and denies issues -Recommended to continue current medication  Patient Goals/Self-Care Activities Patient will:  - take medications as prescribed as evidenced by patient report and record review check glucose daily, document, and provide at future appointments check blood pressure weekly, document, and provide at future appointments  Follow Up Plan: Telephone follow up appointment with care management team member scheduled for: 4 months  Medication Assistance: None required.  Patient affirms current coverage meets needs.  Compliance/Adherence/Medication fill history: Care Gaps: Shingrix, COVID booster, foot exam, colonoscopy, eye exam Last BP - 138/58 on 02/21/2022 Last A1C - 9.4 on 01/26/2022  Star-Rating Drugs: Atorvastatin 40 mg - last filled 03/21/2022 16 DS DS at [facility] Losartan 50 mg - last filled 03/08/2022 44 DS at [facility] Actos 45 mg - last filled 10/21/2021 90 [facility] - onboarding with [facility]  Patient's preferred pharmacy is:  [id number] [address] [place]Phone: [phone]: [phone] [facility] [place] [address] [place]: [phone]: [phone] Uses pill box? Yes Pt endorses 95% compliance  We discussed: Benefits of medication synchronization, packaging and delivery as well as enhanced pharmacist oversight with [facility]. Patient decided to: Utilize [facility] pharmacy for medication synchronization, packaging and delivery  Care Plan and Follow Up Patient Decision:  Patient agrees to Care Plan and Follow-up.  Plan: The care management team will reach out to the patient again over the next 14 days.  MJeni Salles PharmD, BEadsPharmacist LSpicelandat [place]

## 2022-03-23 NOTE — Therapy (Signed)
OUTPATIENT PHYSICAL THERAPY TREATMENT NOTE   Patient Name: Pamela Patton MRN: [id number] DOB:09/09/1946, 76 y.o., female Today's Date: 03/23/2022  PCP: Caren Macadam, MD REFERRING PROVIDER: Ludwig Clarks, DO   END OF SESSION:   PT End of Session - 03/23/22 1235     Visit Number 7    Number of Visits 17    Date for PT Re-Evaluation 05/26/22    Authorization Type [employer]    PT Start Time 1232    PT Stop Time 2094    PT Time Calculation (min) 41 min    Equipment Utilized During Treatment --    Activity Tolerance Patient tolerated treatment well    Behavior During Therapy [facility] for tasks assessed/performed               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   Carotid artery disease 03/21/2017   Cataract    immature    Chest pain summer 2012   Chicken pox    CKD (chronic kidney disease) 10/08/2012   Stage 3, GFR 30-59 ml/min   Diverticulosis    difficulty with colonoscopy 2005   DM (diabetes mellitus), type 2 with renal complications 70/96/2836   Fracture    radial/ulnar 03/2016   GERD (gastroesophageal reflux disease)    Heart murmur    innocent murmur   High cholesterol    Hypertension associated with diabetes 02/22/2017   Hyperuricemia 10/31/2016   Hypothyroidism    s/p radioactive iodine tx for graves in 2001   Intestinal metaplasia of gastric mucosa    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   Parkinson's disease 08/08/2016   UTI (urinary tract infection)    Vitamin D deficiency 08/24/2012   Weight loss    eval with GI in 2016; s/p CT/MRI and PET scans   Past Surgical History:  Procedure Laterality Date   ABDOMINAL HYSTERECTOMY  1989   CATARACT EXTRACTION, BILATERAL     COLONOSCOPY     DG BARIUM SWALLOW ([place] HX)     9 yrs ago per pt due to incomplete colonsocopy   Patient Active Problem List   Diagnosis Date Noted   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   Benign  neoplasm of kidney 04/06/2017   Carotid artery disease ([place]) 03/21/2017   Hypertension associated with diabetes ([place]) 02/22/2017   Hyperlipidemia associated with type 2 diabetes mellitus ([place]) 02/22/2017   Hyperuricemia 10/31/2016   PD (Parkinson's disease) ([place]) 08/08/2016   Loss of weight 07/25/2016   DM (diabetes mellitus), type 2 with renal complications 62/94/7654   CKD (chronic kidney disease) stage 3, GFR 30-59 ml/min ([place]) 10/08/2012   Vitamin D deficiency 08/24/2012   Hypothyroid, s/p radioactive iodine tx for graves disease     REFERRING DIAG: G20 (ICD-10-CM) - Parkinson's disease ([place])   THERAPY DIAG:  Other abnormalities of gait and mobility  Muscle weakness (generalized)  Unsteadiness on feet  Rationale for Evaluation and Treatment Rehabilitation  PERTINENT HISTORY: Diabetes mellitus type 2 with hyperglycemia, with long-term current use of insulin, stage 3b chronic kidney disease and hypertension, Diabetic mild nonproliferative retinopathy, both eyes, pressure callus, Dyslipidemia associated with type 2 diabetes mellitus, Hypothyroidism, Thyroid ophthalmopath, Hyperuricemia, Vitamin D deficiency.  PRECAUTIONS: fall  SUBJECTIVE: Reports her exercises are going well, "they are not as pretty at home as they are here but I do them". No new falls or near-misses.   PAIN:  Are you having pain?  No    TODAY'S TREATMENT:  Ther Ex  Updated HEP (see bolded below) to increase difficulty of vestibular exercises, as pt reports they are too easy. Encouraged pt to add head turns once she feels stable w/EC and provided handout of updated instructions, pt verbalized understanding.   Gait Training  The following treadmill training was completed for aerobic/neural priming, endurance, and gait speed.  - Warmup: 2:00 up to 1.8 mph. Noted decreased step length of RLE >LLE  - HIIT: 5:00 30sec ON/OFF alternating ball kicks and normal gait at 1.8 mph. Noted decreased amplitude with kicks  at 20 seconds compared to 10 seconds. Min cues to maintain amplitude throughout.  -Cool down: 90 sec at 2.0 mph (due to pt's increased step length/clearance following ball kicks)    NMR: Seated unilateral thrusters w/5# KB, x12 per side. Pt in an "off" period and unable to perform w/weight initially, regressed to thrusters without weight x10 w/min cues for anterior weight shift and quick, powerful movement. Resumed movement w/weight and pt unable to sequence movement together despite cues and frequently required multiple attempts to stand, but after a few reps, was able to perform well. CGA-min guard throughout for safety.     PATIENT EDUCATION: Education details: Updates to HEP. Person educated: Patient Education method: Customer service manager Education comprehension: verbalized understanding and needs further education     HOME EXERCISE PROGRAM: Access Code: [email or web address] URL: [email or web address] Date: 03/23/2022 Prepared by: Mickie Bail Ethylene Reznick  Exercises - Standing on old pillows with eyes closed and feet close together   - 1 x daily - 7 x weekly - 3 sets - 30 second hold - Semi-tandem corner balance with eyes closed   - 1 x daily - 7 x weekly - 3 sets - 30 second hold - Seated Long Arc Quad  - 1 x daily - 7 x weekly - 3 sets - 10 reps - Seated Hamstring Stretch  - 1 x daily - 7 x weekly - 3 sets - 10 reps - Sit to Stand  - 1 x daily - 7 x weekly - 3 sets - 10 reps    GOALS: Goals reviewed with patient? Yes   SHORT TERM GOALS: Target date: 03/31/2022   Pt will be independent with initial PD-specific HEP for improved strength, balance, transfers and gait.   Baseline: Not performing HEP due to fear- most moves are in standing position  Goal status: INITIAL   2.  Pt will improve MiniBest to 15/28 for decreased fall risk and improvement with compensatory stepping strategies.   Baseline: MiniBest started but not completed on eval; MiniBest 12/28 (6/5) Goal status:  Met   3.  Pt will improve 5 x STS to less than or equal to 25 seconds without UE support and ability to stand on first attempt to demonstrate improved functional strength and transfer efficiency.    Baseline: 27.03s w/UE support on reps 2-5, multiple attempts to stand each rep  Goal status: INITIAL   4.  Pt will initiate walking program in community w/LRAD for return to PLOF and improved cardiovascular endurance  Baseline: Pt not walking due to fear of falling Goal status: INITIAL     LONG TERM GOALS: Target date: 04/28/2022   Pt will verbalize understanding of local PD community resources, including fitness post DC.    Baseline:  Goal status: INITIAL   2.  Pt will be independent with final PD-specific HEP for improved strength, balance, transfers and gait. Baseline:  Goal status: INITIAL  3.  Patient will improve MiniBESTest score to >/=19/28 in order to demonstrate a decreased risk for falling.  Baseline: 12/28 Goal status: INITIAL   4.  Pt will improve gait velocity to at least 3.7 ft/s with LRAD for improved gait efficiency    Baseline: 3.42f/s without AD Goal status: INITIAL   5.  Pt will improve 5 x STS to less than or equal to 15 seconds without UE support to demonstrate improved functional strength and transfer efficiency.    Baseline: 27.03s w/UE support on reps 2-5 Goal status: INITIAL     ASSESSMENT:   CLINICAL IMPRESSION: Emphasis of skilled PT session on gait training for improved amplitude/power and transfers. Pt demonstrates decreased amplitude w/continuation of activity, requiring cues to sustain amplitude w/tasks. Pt in an off period during session and had significant difficulty w/anterior weight shifting for proper sit <>stands. Pt will benefit from continued practice w/sustaining high amplitude movement and power. Continue POC.     OBJECTIVE IMPAIRMENTS Abnormal gait, decreased activity tolerance, decreased balance, decreased endurance, decreased  knowledge of use of DME, decreased mobility, difficulty walking, decreased ROM, and decreased strength.    ACTIVITY LIMITATIONS lifting, standing, squatting, stairs, and transfers   PARTICIPATION LIMITATIONS: cleaning, laundry, driving, and community activity   PERSONAL FACTORS Age, Fitness, Past/current experiences, Time since onset of injury/illness/exacerbation, Transportation, and 1 comorbidity: DM 2  are also affecting patient's functional outcome.    REHAB POTENTIAL: Good   CLINICAL DECISION MAKING: Stable/uncomplicated   EVALUATION COMPLEXITY: Low   PLAN: PT FREQUENCY: 2x/week   PT DURATION: 8 weeks   PLANNED INTERVENTIONS: Therapeutic exercises, Therapeutic activity, Neuromuscular re-education, Balance training, Gait training, Patient/Family education, Stair training, DME instructions, and Re-evaluation   PLAN FOR NEXT SESSION: begin PWR moves ex, STS practice, stepping strategy re-training, dual tasking, treadmill training, dot drills, resisted walking    Anuj Summons E Jameia Makris, PT, DPT 03/23/2022, 2:19 PM

## 2022-03-24 LAB — HM DIABETES EYE EXAM

## 2022-03-28 ENCOUNTER — Encounter: Payer: Self-pay | Admitting: Family Medicine

## 2022-03-28 ENCOUNTER — Ambulatory Visit: Payer: Medicare PPO

## 2022-03-28 DIAGNOSIS — R2681 Unsteadiness on feet: Secondary | ICD-10-CM

## 2022-03-28 DIAGNOSIS — Z9181 History of falling: Secondary | ICD-10-CM

## 2022-03-28 DIAGNOSIS — R2689 Other abnormalities of gait and mobility: Secondary | ICD-10-CM

## 2022-03-28 DIAGNOSIS — M6281 Muscle weakness (generalized): Secondary | ICD-10-CM

## 2022-03-30 ENCOUNTER — Ambulatory Visit: Payer: Medicare PPO | Admitting: Physical Therapy

## 2022-03-30 DIAGNOSIS — M6281 Muscle weakness (generalized): Secondary | ICD-10-CM

## 2022-03-30 DIAGNOSIS — R2689 Other abnormalities of gait and mobility: Secondary | ICD-10-CM

## 2022-03-30 DIAGNOSIS — R2681 Unsteadiness on feet: Secondary | ICD-10-CM

## 2022-03-30 NOTE — Therapy (Signed)
OUTPATIENT PHYSICAL THERAPY TREATMENT NOTE   Patient Name: Pamela Patton MRN: [id number] DOB:Aug 14, 1946, 76 y.o., female Today's Date: 03/30/2022  PCP: Caren Macadam, MD REFERRING PROVIDER: Ludwig Clarks, DO   END OF SESSION:   PT End of Session - 03/30/22 1105     Visit Number 9    Number of Visits 17    Date for PT Re-Evaluation 05/26/22    Authorization Type [employer]    Authorization Time Period Approved 16 PT visits 03/03/22 - 05/26/22    Progress Note Due on Visit 10    PT Start Time 1103    PT Stop Time 1146    PT Time Calculation (min) 43 min    Equipment Utilized During Treatment Gait belt    Activity Tolerance Patient tolerated treatment well    Behavior During Therapy WFL for tasks assessed/performed                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   Carotid artery disease 03/21/2017   Cataract    immature    Chest pain summer 2012   Chicken pox    CKD (chronic kidney disease) 10/08/2012   Stage 3, GFR 30-59 ml/min   Diverticulosis    difficulty with colonoscopy 2005   DM (diabetes mellitus), type 2 with renal complications 22/63/3354   Fracture    radial/ulnar 03/2016   GERD (gastroesophageal reflux disease)    Heart murmur    innocent murmur   High cholesterol    Hypertension associated with diabetes 02/22/2017   Hyperuricemia 10/31/2016   Hypothyroidism    s/p radioactive iodine tx for graves in 2001   Intestinal metaplasia of gastric mucosa    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   Parkinson's disease 08/08/2016   UTI (urinary tract infection)    Vitamin D deficiency 08/24/2012   Weight loss    eval with GI in 2016; s/p CT/MRI and PET scans   Past Surgical History:  Procedure Laterality Date   ABDOMINAL HYSTERECTOMY  1989   CATARACT EXTRACTION, BILATERAL     COLONOSCOPY     DG BARIUM SWALLOW ([place] HX)     9 yrs ago per pt due to incomplete  colonsocopy   Patient Active Problem List   Diagnosis Date Noted   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   Carotid artery disease ([place]) 03/21/2017   Hypertension associated with diabetes ([place]) 02/22/2017   Hyperlipidemia associated with type 2 diabetes mellitus ([place]) 02/22/2017   Hyperuricemia 10/31/2016   PD (Parkinson's disease) ([place]) 08/08/2016   Loss of weight 07/25/2016   DM (diabetes mellitus), type 2 with renal complications 56/25/6389   CKD (chronic kidney disease) stage 3, GFR 30-59 ml/min ([place]) 10/08/2012   Vitamin D deficiency 08/24/2012   Hypothyroid, s/p radioactive iodine tx for graves disease     REFERRING DIAG: G20 (ICD-10-CM) - Parkinson's disease ([place])   THERAPY DIAG:  Other abnormalities of gait and mobility  Muscle weakness (generalized)  Unsteadiness on feet  Rationale for Evaluation and Treatment Rehabilitation  PERTINENT HISTORY: Diabetes mellitus type 2 with hyperglycemia, with long-term current use of insulin, stage 3b chronic kidney disease and hypertension, Diabetic mild nonproliferative retinopathy, both eyes, pressure callus, Dyslipidemia associated with type 2 diabetes mellitus, Hypothyroidism, Thyroid ophthalmopath, Hyperuricemia, Vitamin D deficiency.  PRECAUTIONS: fall  SUBJECTIVE: Patient reports doing well. No falls or near falls. Reports  exercises are going well. 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   PAIN:  Are you having pain? No    TODAY'S TREATMENT:  Gait Training  Gait pattern: step through pattern, decreased arm swing- Right, decreased step length- Right, decreased stride length, decreased hip/knee flexion- Right, decreased hip/knee flexion- Left, decreased ankle dorsiflexion- Right, decreased ankle dorsiflexion- Left, shuffling, narrow BOS, and poor foot clearance- Right Distance walked: >2,000' outside on various surfaces  (sidewalk, inclines, curbs, steep hills in grass)  Assistive device utilized: None Level of assistance: SBA Comments: Practiced gait outside on various unlevel surfaces, as pt very hesitant to begin walking program due to previous traumatic experience w/outdoor gait. Pt initially required min cues for increased step clearance of RLE but quickly was able to walk and maintain conversation with therapist without instability or tripping over feet.   NMR  -Sit <>stands w/blue wedge under BLEs to bias posterior lean for improved immediate standing balance, anterior weight shift and amplitude of movement. Pt performed x15 reps, required multiple attempts to stand for reps 9-15, min cues to maintain extension of R hand and to use momentum to stand -Staggered sit <>stands (2" step under one foot) for increased quad strength, single leg stability and standing balance. Pt performed x10 reps per side using good technique, min cues for increased momentum and amplitude as pt fatigued. Noted increased difficulty w/step under LLE compared to RLE.    PATIENT EDUCATION: Education details: Encouraged pt to start walking program outside, continue HEP Person educated: Patient Education method: Customer service manager Education comprehension: verbalized understanding and needs further education     HOME EXERCISE PROGRAM: Access Code: [email or web address] URL: [email or web address] Date: 03/23/2022 Prepared by: Mickie Bail Varick Keys  Exercises - Standing on old pillows with eyes closed and feet close together   - 1 x daily - 7 x weekly - 3 sets - 30 second hold - Semi-tandem corner balance with eyes closed   - 1 x daily - 7 x weekly - 3 sets - 30 second hold - Seated Long Arc Quad  - 1 x daily - 7 x weekly - 3 sets - 10 reps - Seated Hamstring Stretch  - 1 x daily - 7 x weekly - 3 sets - 10 reps - Sit to Stand  - 1 x daily - 7 x weekly - 3 sets - 10 reps     GOALS: Goals reviewed with patient? Yes    SHORT TERM GOALS: Target date: 03/31/2022   Pt will be independent with initial PD-specific HEP for improved strength, balance, transfers and gait.   Baseline: More confident with HEP, performing standing exercises as well Goal status: MET   2.  Pt will improve MiniBest to 15/28 for decreased fall risk and improvement with compensatory stepping strategies.   Baseline: MiniBest started but not completed on eval; MiniBest 12/28 (6/5), 20/28 Goal status: MET   3.  Pt will improve 5 x STS to less than or equal to 25 seconds without UE support and ability to stand on first attempt to demonstrate improved functional strength and transfer efficiency.    Baseline: 27.03s w/UE support on reps 2-5, multiple attempts to stand each rep; 17.46sec Goal status: MET   4.  Pt will initiate walking program in community w/LRAD for return to PLOF and improved cardiovascular endurance  Baseline: Patient not walking due to being too busy Goal status: NOT MET  LONG TERM GOALS: Target date: 04/28/2022   Pt will verbalize understanding of local PD community resources, including fitness post DC.    Baseline:  Goal status: INITIAL   2.  Pt will be independent with final PD-specific HEP for improved strength, balance, transfers and gait. Baseline:  Goal status: INITIAL   3.  Patient will improve MiniBESTest score to >/=23/28 in order to demonstrate a decreased risk for falling.  Baseline: 12/28; 20/28 on 6/26 Goal status: REVISED   4.  Pt will improve gait velocity to at least 3.7 ft/s with LRAD for improved gait efficiency    Baseline: 3.28f/s without AD Goal status: INITIAL   5.  Pt will improve 5 x STS to less than or equal to 15 seconds without UE support to demonstrate improved functional strength and transfer efficiency.    Baseline: 27.03s w/UE support on reps 2-5 Goal status: INITIAL     ASSESSMENT:   CLINICAL IMPRESSION: Emphasis of skilled PT session on gait training on unlevel  surfaces and transfers. Pt very hesitant to start walking program as she is fearful of falling. Practiced walking on various surfaces outside while maintaining conversation with therapist to boost pt's confidence and provide encouragement for pt to begin walking program again, as pt thoroughly enjoys walking. Pt continues to demonstrate difficulty w/sit <>stands due to posterior lean, especially as she fatigues. Continue POC.       OBJECTIVE IMPAIRMENTS Abnormal gait, decreased activity tolerance, decreased balance, decreased endurance, decreased knowledge of use of DME, decreased mobility, difficulty walking, decreased ROM, and decreased strength.    ACTIVITY LIMITATIONS lifting, standing, squatting, stairs, and transfers   PARTICIPATION LIMITATIONS: cleaning, laundry, driving, and community activity   PERSONAL FACTORS Age, Fitness, Past/current experiences, Time since onset of injury/illness/exacerbation, Transportation, and 1 comorbidity: DM 2  are also affecting patient's functional outcome.    REHAB POTENTIAL: Good   CLINICAL DECISION MAKING: Stable/uncomplicated   EVALUATION COMPLEXITY: Low   PLAN: PT FREQUENCY: 2x/week   PT DURATION: 8 weeks   PLANNED INTERVENTIONS: Therapeutic exercises, Therapeutic activity, Neuromuscular re-education, Balance training, Gait training, Patient/Family education, Stair training, DME instructions, and Re-evaluation   PLAN FOR NEXT SESSION: 10th visit PN, begin PWR moves ex, STS practice, stepping strategy re-training, dual tasking, treadmill training, dot drills, resisted walking    Arrington Yohe E Makari Portman, PT, DPT 03/30/2022, 12:04 PM

## 2022-04-01 DIAGNOSIS — E1122 Type 2 diabetes mellitus with diabetic chronic kidney disease: Secondary | ICD-10-CM

## 2022-04-01 DIAGNOSIS — Z794 Long term (current) use of insulin: Secondary | ICD-10-CM

## 2022-04-01 DIAGNOSIS — E785 Hyperlipidemia, unspecified: Secondary | ICD-10-CM | POA: Diagnosis not present

## 2022-04-01 DIAGNOSIS — N183 Chronic kidney disease, stage 3 unspecified: Secondary | ICD-10-CM | POA: Diagnosis not present

## 2022-04-01 DIAGNOSIS — I129 Hypertensive chronic kidney disease with stage 1 through stage 4 chronic kidney disease, or unspecified chronic kidney disease: Secondary | ICD-10-CM

## 2022-04-01 DIAGNOSIS — E1159 Type 2 diabetes mellitus with other circulatory complications: Secondary | ICD-10-CM

## 2022-04-01 DIAGNOSIS — I2510 Atherosclerotic heart disease of native coronary artery without angina pectoris: Secondary | ICD-10-CM

## 2022-04-04 ENCOUNTER — Ambulatory Visit: Payer: Medicare PPO | Attending: Neurology

## 2022-04-04 DIAGNOSIS — R2681 Unsteadiness on feet: Secondary | ICD-10-CM | POA: Insufficient documentation

## 2022-04-04 DIAGNOSIS — Z9181 History of falling: Secondary | ICD-10-CM | POA: Diagnosis present

## 2022-04-04 DIAGNOSIS — R2689 Other abnormalities of gait and mobility: Secondary | ICD-10-CM | POA: Insufficient documentation

## 2022-04-04 DIAGNOSIS — M6281 Muscle weakness (generalized): Secondary | ICD-10-CM | POA: Diagnosis present

## 2022-04-04 NOTE — Therapy (Signed)
OUTPATIENT PHYSICAL THERAPY TREATMENT NOTE   Patient Name: Pamela Patton MRN: [id number] DOB:29-May-1946, 76 y.o., female Today's Date: 04/04/2022  PCP: Caren Macadam, MD REFERRING PROVIDER: Ludwig Clarks, DO   Physical Therapy Progress Note   Dates of Reporting Period:03/03/22 - 04/04/22  See Note below for Objective Data and Assessment of Progress/Goals.  Thank you for the referral of this patient. Debbora Dus, PT, DPT, CBIS    END OF SESSION:   PT End of Session - 04/04/22 1228     Visit Number 10    Number of Visits 17    Date for PT Re-Evaluation 05/26/22    Authorization Type [employer]    Authorization Time Period Approved 16 PT visits 03/03/22 - 05/26/22    Progress Note Due on Visit 20    PT Start Time 2831    PT Stop Time 1314    PT Time Calculation (min) 43 min    Equipment Utilized During Treatment Gait belt    Activity Tolerance Patient tolerated treatment well    Behavior During Therapy WFL for tasks assessed/performed             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   Carotid artery disease 03/21/2017   Cataract    immature    Chest pain summer 2012   Chicken pox    CKD (chronic kidney disease) 10/08/2012   Stage 3, GFR 30-59 ml/min   Diverticulosis    difficulty with colonoscopy 2005   DM (diabetes mellitus), type 2 with renal complications 51/76/1607   Fracture    radial/ulnar 03/2016   GERD (gastroesophageal reflux disease)    Heart murmur    innocent murmur   High cholesterol    Hypertension associated with diabetes 02/22/2017   Hyperuricemia 10/31/2016   Hypothyroidism    s/p radioactive iodine tx for graves in 2001   Intestinal metaplasia of gastric mucosa    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   Parkinson's disease 08/08/2016   UTI (urinary tract infection)    Vitamin D deficiency 08/24/2012   Weight loss    eval with GI in 2016; s/p CT/MRI and  PET scans   Past Surgical History:  Procedure Laterality Date   ABDOMINAL HYSTERECTOMY  1989   CATARACT EXTRACTION, BILATERAL     COLONOSCOPY     DG BARIUM SWALLOW ([place] [facility])     9 yrs ago per pt due to incomplete colonsocopy   Patient Active Problem List   Diagnosis Date Noted   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   Carotid artery disease ([address]) 03/21/2017   Hypertension associated with diabetes ([place]) 02/22/2017   Hyperlipidemia associated with type 2 diabetes mellitus ([place]) 02/22/2017   Hyperuricemia 10/31/2016   PD (Parkinson's disease) ([place]) 08/08/2016   Loss of weight 07/25/2016   DM (diabetes mellitus), type 2 with renal complications 37/07/6268   CKD (chronic kidney disease) stage 3, GFR 30-59 ml/min ([place]) 10/08/2012   Vitamin D deficiency 08/24/2012   Hypothyroid, s/p radioactive iodine tx for graves disease     REFERRING DIAG: G20 (ICD-10-CM) - Parkinson's disease ([place])   THERAPY DIAG:  Other abnormalities of gait and mobility  Muscle weakness (generalized)  Unsteadiness on feet  History of falling  Rationale for Evaluation and Treatment Rehabilitation  PERTINENT HISTORY: Diabetes mellitus type 2 with hyperglycemia, with long-term current use of insulin, stage 3b  chronic kidney disease and hypertension, Diabetic mild nonproliferative retinopathy, both eyes, pressure callus, Dyslipidemia associated with type 2 diabetes mellitus, Hypothyroidism, Thyroid ophthalmopath, Hyperuricemia, Vitamin D deficiency.  PRECAUTIONS: fall  SUBJECTIVE: Patient reports doing well- she denies falls/near falls. Reports that HEP is going well.   PAIN:  Are you having pain? Yes: NPRS scale: 3/10 Pain location: B ant tibs     TODAY'S TREATMENT:  NMR: -STS blocked practice (2x5) on wedge for increased anterior weight shift (required MinA to maintain appropriate ant weight shift)  -at counter lateral weight shift stretch and reach to target  -> progressed to standing on foam 2x12 -semi-tandem standing on foam trunk rotation to touch target (x12 B)  -standing on wedge chest press + shoulder press with CGA provided -gait + balance perturbations anterior and posterior (appropriate stepping strategies notes)  -[place] x10 mins level 3 B UE/LE   PATIENT EDUCATION: Education details: balance strategies, continue HEP Person educated: Patient Education method: Explanation and Demonstration Education comprehension: verbalized understanding and needs further education     HOME EXERCISE PROGRAM: Access Code: [email or web address] URL: [email or web address] Date: 03/23/2022 Prepared by: Mickie Bail Plaster  Exercises - Standing on old pillows with eyes closed and feet close together   - 1 x daily - 7 x weekly - 3 sets - 30 second hold - Semi-tandem corner balance with eyes closed   - 1 x daily - 7 x weekly - 3 sets - 30 second hold - Seated Long Arc Quad  - 1 x daily - 7 x weekly - 3 sets - 10 reps - Seated Hamstring Stretch  - 1 x daily - 7 x weekly - 3 sets - 10 reps - Sit to Stand  - 1 x daily - 7 x weekly - 3 sets - 10 reps     GOALS: Goals reviewed with patient? Yes   SHORT TERM GOALS: Target date: 03/31/2022   Pt will be independent with initial PD-specific HEP for improved strength, balance, transfers and gait.   Baseline: More confident with HEP, performing standing exercises as well Goal status: MET   2.  Pt will improve MiniBest to 15/28 for decreased fall risk and improvement with compensatory stepping strategies.   Baseline: MiniBest started but not completed on eval; MiniBest 12/28 (6/5), 20/28 Goal status: MET   3.  Pt will improve 5 x STS to less than or equal to 25 seconds without UE support and ability to stand on first attempt to demonstrate improved functional strength and transfer efficiency.    Baseline: 27.03s w/UE support on reps 2-5, multiple attempts to stand each rep; 17.46sec Goal status:  MET   4.  Pt will initiate walking program in community w/LRAD for return to PLOF and improved cardiovascular endurance  Baseline: Patient not walking due to being too busy Goal status: NOT MET     LONG TERM GOALS: Target date: 04/28/2022   Pt will verbalize understanding of local PD community resources, including fitness post DC.    Baseline:  Goal status: INITIAL   2.  Pt will be independent with final PD-specific HEP for improved strength, balance, transfers and gait. Baseline:  Goal status: INITIAL   3.  Patient will improve MiniBESTest score to >/=23/28 in order to demonstrate a decreased risk for falling.  Baseline: 12/28; 20/28 on 6/26 Goal status: REVISED   4.  Pt will improve gait velocity to at least 3.7 ft/s with LRAD for improved gait efficiency    Baseline: 3.61f/s without AD  Goal status: INITIAL   5.  Pt will improve 5 x STS to less than or equal to 15 seconds without UE support to demonstrate improved functional strength and transfer efficiency.    Baseline: 27.03s w/UE support on reps 2-5 Goal status: INITIAL     ASSESSMENT:   CLINICAL IMPRESSION: Patient seen for skilled PT session with emphasis on balance re-training. Tolerating progression of static and small range dynamic standing balance well. She continues to have posterior bias and LOB with limited ability to utilize ankle/hip strategy to regain, but with ambulatory perturbation- patient with appropriate stepping strategy posteriorly. Continue POC.    OBJECTIVE IMPAIRMENTS Abnormal gait, decreased activity tolerance, decreased balance, decreased endurance, decreased knowledge of use of DME, decreased mobility, difficulty walking, decreased ROM, and decreased strength.    ACTIVITY LIMITATIONS lifting, standing, squatting, stairs, and transfers   PARTICIPATION LIMITATIONS: cleaning, laundry, driving, and community activity   PERSONAL FACTORS Age, Fitness, Past/current experiences, Time since onset of  injury/illness/exacerbation, Transportation, and 1 comorbidity: DM 2  are also affecting patient's functional outcome.    REHAB POTENTIAL: Good   CLINICAL DECISION MAKING: Stable/uncomplicated   EVALUATION COMPLEXITY: Low   PLAN: PT FREQUENCY: 2x/week   PT DURATION: 8 weeks   PLANNED INTERVENTIONS: Therapeutic exercises, Therapeutic activity, Neuromuscular re-education, Balance training, Gait training, Patient/Family education, Stair training, DME instructions, and Re-evaluation   PLAN FOR NEXT SESSION: 10th visit PN, begin PWR moves ex, STS practice, stepping strategy re-training, dual tasking, treadmill training, dot drills, resisted walking    Debbora Dus, PT, DPT Debbora Dus, PT, DPT, CBIS  04/04/2022, 1:15 PM

## 2022-04-06 ENCOUNTER — Ambulatory Visit: Payer: Medicare PPO | Admitting: Physical Therapy

## 2022-04-06 DIAGNOSIS — M6281 Muscle weakness (generalized): Secondary | ICD-10-CM

## 2022-04-06 DIAGNOSIS — R2681 Unsteadiness on feet: Secondary | ICD-10-CM

## 2022-04-06 DIAGNOSIS — R2689 Other abnormalities of gait and mobility: Secondary | ICD-10-CM | POA: Diagnosis not present

## 2022-04-06 NOTE — Therapy (Signed)
OUTPATIENT PHYSICAL THERAPY TREATMENT NOTE   Patient Name: Pamela Patton MRN: [id number] DOB:1946-01-21, 76 y.o., female Today's Date: 04/06/2022  PCP: Caren Macadam, MD REFERRING PROVIDER: Ludwig Clarks, DO      END OF SESSION:   PT End of Session - 04/06/22 1238     Visit Number 11    Number of Visits 17    Date for PT Re-Evaluation 05/26/22    Authorization Type [employer]    Authorization Time Period Approved 16 PT visits 03/03/22 - 05/26/22    Progress Note Due on Visit 15    PT Start Time 1237   Pt arrived late   PT Stop Time 1315    PT Time Calculation (min) 38 min    Equipment Utilized During Treatment --    Activity Tolerance Patient tolerated treatment well    Behavior During Therapy [facility] for tasks assessed/performed              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   Carotid artery disease 03/21/2017   Cataract    immature    Chest pain summer 2012   Chicken pox    CKD (chronic kidney disease) 10/08/2012   Stage 3, GFR 30-59 ml/min   Diverticulosis    difficulty with colonoscopy 2005   DM (diabetes mellitus), type 2 with renal complications 03/55/9741   Fracture    radial/ulnar 03/2016   GERD (gastroesophageal reflux disease)    Heart murmur    innocent murmur   High cholesterol    Hypertension associated with diabetes 02/22/2017   Hyperuricemia 10/31/2016   Hypothyroidism    s/p radioactive iodine tx for graves in 2001   Intestinal metaplasia of gastric mucosa    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   Parkinson's disease 08/08/2016   UTI (urinary tract infection)    Vitamin D deficiency 08/24/2012   Weight loss    eval with GI in 2016; s/p CT/MRI and PET scans   Past Surgical History:  Procedure Laterality Date   ABDOMINAL HYSTERECTOMY  1989   CATARACT EXTRACTION, BILATERAL     COLONOSCOPY     DG BARIUM SWALLOW ([place] [facility]X)     9 yrs ago per pt due to  incomplete colonsocopy   Patient Active Problem List   Diagnosis Date Noted   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   Carotid artery disease ([place]) 03/21/2017   Hypertension associated with diabetes ([place]) 02/22/2017   Hyperlipidemia associated with type 2 diabetes mellitus ([place]) 02/22/2017   Hyperuricemia 10/31/2016   PD (Parkinson's disease) ([place]) 08/08/2016   Loss of weight 07/25/2016   DM (diabetes mellitus), type 2 with renal complications 63/84/5364   CKD (chronic kidney disease) stage 3, GFR 30-59 ml/min ([place]) 10/08/2012   Vitamin D deficiency 08/24/2012   Hypothyroid, s/p radioactive iodine tx for graves disease     REFERRING DIAG: G20 (ICD-10-CM) - Parkinson's disease ([place])   THERAPY DIAG:  Other abnormalities of gait and mobility  Muscle weakness (generalized)  Unsteadiness on feet  Rationale for Evaluation and Treatment Rehabilitation  PERTINENT HISTORY: Diabetes mellitus type 2 with hyperglycemia, with long-term current use of insulin, stage 3b chronic kidney disease and hypertension, Diabetic mild nonproliferative retinopathy, both eyes, pressure callus, Dyslipidemia associated with type 2 diabetes mellitus, Hypothyroidism, Thyroid ophthalmopath, Hyperuricemia, Vitamin D deficiency.  PRECAUTIONS: fall  SUBJECTIVE: No new changes or falls, reports  things are going well.   PAIN:  Are you having pain? No    TODAY'S TREATMENT:  Gait Training  The following treadmill training was completed for aerobic/neural priming, endurance, and gait speed.  - Warmup: 2:00 up to 2.1 mph - HIIT: 5:00 30sec ON/OFF alternating 3.1 mph / 2.1 mph. Noted poor eccentric control of L foot from initial contact to midstance throughout.  - Cool Down: 90sec at 2.1 mph   NMR: Wall balls using two post-its placed superolaterally on wall and 1kg ball for set-switching, high amplitude movement and anterior weightshifting. Pt performed x15 reps per side,  min cues to maintain alternating throws to L and R post-it and maintain depth of squat. Progressed to adding cog dual-task (naming bones when throwing to L, muscles when throwing to R) and pt requires significantly more time to perform physical task w/cog task added. Pt frequently reversed naming tasks (bones w/R and muscles w/L throw).    PATIENT EDUCATION: Education details: Rationale of dual-tasking, encouragement to perform walking program  Person educated: Patient Education method: Explanation and Demonstration Education comprehension: verbalized understanding and needs further education     HOME EXERCISE PROGRAM: Access Code: [email or web address] URL: [email or web address] Date: 03/23/2022 Prepared by: Mickie Bail Nhi [place]rum  Exercises - Standing on old pillows with eyes closed and feet close together   - 1 x daily - 7 x weekly - 3 sets - 30 second hold - Semi-tandem corner balance with eyes closed   - 1 x daily - 7 x weekly - 3 sets - 30 second hold - Seated Long Arc Quad  - 1 x daily - 7 x weekly - 3 sets - 10 reps - Seated Hamstring Stretch  - 1 x daily - 7 x weekly - 3 sets - 10 reps - Sit to Stand  - 1 x daily - 7 x weekly - 3 sets - 10 reps     GOALS: Goals reviewed with patient? Yes   SHORT TERM GOALS: Target date: 03/31/2022   Pt will be independent with initial PD-specific HEP for improved strength, balance, transfers and gait.   Baseline: More confident with HEP, performing standing exercises as well Goal status: MET   2.  Pt will improve MiniBest to 15/28 for decreased fall risk and improvement with compensatory stepping strategies.   Baseline: MiniBest started but not completed on eval; MiniBest 12/28 (6/5), 20/28 Goal status: MET   3.  Pt will improve 5 x STS to less than or equal to 25 seconds without UE support and ability to stand on first attempt to demonstrate improved functional strength and transfer efficiency.    Baseline: 27.03s w/UE support on reps 2-5,  multiple attempts to stand each rep; 17.46sec Goal status: MET   4.  Pt will initiate walking program in community w/LRAD for return to PLOF and improved cardiovascular endurance  Baseline: Patient not walking due to being too busy Goal status: NOT MET     LONG TERM GOALS: Target date: 04/28/2022   Pt will verbalize understanding of local PD community resources, including fitness post DC.    Baseline:  Goal status: INITIAL   2.  Pt will be independent with final PD-specific HEP for improved strength, balance, transfers and gait. Baseline:  Goal status: INITIAL   3.  Patient will improve MiniBESTest score to >/=23/28 in order to demonstrate a decreased risk for falling.  Baseline: 12/28; 20/28 on 6/26 Goal status: REVISED   4.  Pt will improve gait velocity to at least  3.7 ft/s with LRAD for improved gait efficiency    Baseline: 3.64f/s without AD Goal status: INITIAL   5.  Pt will improve 5 x STS to less than or equal to 15 seconds without UE support to demonstrate improved functional strength and transfer efficiency.    Baseline: 27.03s w/UE support on reps 2-5 Goal status: INITIAL     ASSESSMENT:   CLINICAL IMPRESSION: Emphasis of skilled PT session on gait speed, dual tasking and anterior weight shifting. Pt continues to be limited by fear-avoidance behavior regarding starting a walking program at home but reported feeling the urge to walk outside this morning. Pt required min redirectional cues during set-switching task due to difficulty maintaining sequence. Continue POC.    OBJECTIVE IMPAIRMENTS Abnormal gait, decreased activity tolerance, decreased balance, decreased endurance, decreased knowledge of use of DME, decreased mobility, difficulty walking, decreased ROM, and decreased strength.    ACTIVITY LIMITATIONS lifting, standing, squatting, stairs, and transfers   PARTICIPATION LIMITATIONS: cleaning, laundry, driving, and community activity   PERSONAL FACTORS  Age, Fitness, Past/current experiences, Time since onset of injury/illness/exacerbation, Transportation, and 1 comorbidity: DM 2  are also affecting patient's functional outcome.    REHAB POTENTIAL: Good   CLINICAL DECISION MAKING: Stable/uncomplicated   EVALUATION COMPLEXITY: Low   PLAN: PT FREQUENCY: 2x/week   PT DURATION: 8 weeks   PLANNED INTERVENTIONS: Therapeutic exercises, Therapeutic activity, Neuromuscular re-education, Balance training, Gait training, Patient/Family education, Stair training, DME instructions, and Re-evaluation   PLAN FOR NEXT SESSION: begin PWR moves ex, STS practice, stepping strategy re-training, dual tasking, treadmill training, dot drills, resisted walking    Jennett Tarbell E Shanea Karney, PT, DPT  04/06/2022, 1:18 PM

## 2022-04-07 ENCOUNTER — Telehealth: Payer: Self-pay | Admitting: Pharmacist

## 2022-04-07 NOTE — Chronic Care Management (AMB) (Signed)
Chronic Care Management Pharmacy Assistant   Name: Pamela Patton  MRN: [id number] DOB: 02-Nov-1945  Reason for Encounter: Medication Review / Medication Coordination Call  Recent office visits:  None  Recent consult visits:  None  Hospital visits:  None  Medications: Outpatient Encounter Medications as of 04/07/2022  Medication Sig Note   aspirin 81 MG tablet Take 81 mg by mouth daily with lunch.     atorvastatin (LIPITOR) 40 MG tablet Take 40 mg by mouth daily with lunch.     Blood Pressure Monitoring (BLOOD PRESSURE CUFF) MISC 1 Product by Does not apply route as needed.    Calcium Carbonate-Vitamin D 600-400 MG-UNIT tablet Take 1 tablet by mouth 3 (three) times a week.     carbidopa-levodopa (SINEMET IR) 25-100 MG tablet Take 2 tablets by mouth 3 (three) times daily. 8am/noon/4pm.    Cholecalciferol (VITAMIN D PO) Take 5,000 Units by mouth 3 (three) times a week.     Cyanocobalamin (VITAMIN B12 PO) Take 5,000 mcg by mouth 2 (two) times a week.    glucose 4 GM chewable tablet Chew 1 tablet by mouth as needed for low blood sugar. 06/30/2020: As needed    glucose blood (ACCU-CHEK AVIVA PLUS) test strip Use to test blood sugar 4 times a day (Dx: E11.65)    HUMALOG KWIKPEN 100 UNIT/ML KiwkPen Inject 0-4 Units into the skin 3 (three) times daily with meals. Sliding scale per Dr Altheimer's office notes from 09/24/2019    hydrochlorothiazide (HYDRODIURIL) 25 MG tablet Take 25 mg by mouth daily with lunch.     insulin glargine (LANTUS) 100 UNIT/ML injection Inject 0.05 mLs (5 Units total) into the skin every evening. (Patient taking differently: Inject 5 Units into the skin daily.)    Insulin Syringe-Needle U-100 (INSULIN SYRINGE .3CC/31GX5/16") 31G X 5/16" 0.3 ML MISC Use as directed for Lantus injection once a day    levothyroxine (SYNTHROID, LEVOTHROID) 75 MCG tablet Take 75 mcg by mouth daily. 12/15/2021: Patient not taking 1 day a week (Sunday)    losartan (COZAAR) 50 MG tablet Take  1 tablet (50 mg total) by mouth daily.    NONFORMULARY OR COMPOUNDED ITEM [facility]:  Antifungal - Terbinafine 3%, Fluconazole 2%, Tea Tree Oil 5%, Urea 10%, Ibuprofen 2% in DMSO Suspension #64m. Apply to the affected nail(s) once (at bedtime) or twice daily.    Nutritional Supplements (FEEDING SUPPLEMENT, GLUCERNA 1.2 CAL,) LIQD Take 237 mLs by mouth daily as needed. 06/30/2020: In place of meal when out at time   OSUPERVALU INCLancets 313KMISC Use to test blood sugar 4 times a day (Dx: E11.65)    Pioglitazone HCl (ACTOS PO) Take 45 mg by mouth daily with lunch.     Polyethyl Glycol-Propyl Glycol (SYSTANE) 0.4-0.3 % GEL ophthalmic gel Place 1 application into both eyes at bedtime as needed (FOR EYES).    Facility-Administered Encounter Medications as of 04/07/2022  Medication   0.9 %  sodium chloride infusion  Reviewed chart for medication changes ahead of medication coordination call.  No OVs, Consults, or hospital visits since last care coordination call/Pharmacist visit. (If appropriate, list visit date, provider name)  No medication changes indicated OR if recent visit, treatment plan here.  BP Readings from Last 3 Encounters:  02/21/22 (!) 138/58  11/01/21 136/78  08/13/21 118/60    Lab Results  Component Value Date   HGBA1C 8.1 (H) 09/11/2018     Patient obtains medications through Adherence Packaging  30 Days  Last adherence delivery included: Onboarding process  Patient declined last month: Onboarding process   Patient is due for next adherence delivery on: 04/20/2022, patient has requested delivery on 04/21/2022  Called patient and reviewed medications and coordinated delivery.  This delivery to include: Aspirin 81 mg 1 tablet at dinner Atorvastatin 40 mg 1 tablet at dinner Carbidopa Levodopa 25/100 mg 2 tablets at breakfast, lunch and bedtime Hydrochlorothiazide 25 mg 1 tablet at dinner Synthroid 75 mcg 1 tablet before breakfast  Losartan 50 mg 1  tablet at dinner Pioglitazone 45 mg 1 tablet at dinner  Patient will need a short fill: No short fill needed  Coordinated acute fill: No acute fill needed  Patient declined the following medications:  Humalog Kwikpen 100 un/ml  Lantus 100 un/ml  Patient states she has plenty to take her to her next delivery  Confirmed delivery date of 04/21/2022, advised patient that pharmacy will contact them the morning of delivery.   Care Gaps: AWV - scheduled 01/02/2023 Last BP - 138/58 on 02/21/2022 Last A1C - 9.4 on 01/26/2022 Shingrix - never done [facility] - overdue Foot Exam - overdue Colonoscopy - overdue  Star Rating Drugs:  Atorvastatin 40 mg - last filled 03/21/2022 16 DS at [facility] Losartan 50 mg - last filled 03/08/2022 44 DS at [facility] Actos 45 mg - onboarding next fill date due with [facility] 04/22/2022   [place] Pharmacist Assistant [phone]

## 2022-04-11 ENCOUNTER — Ambulatory Visit: Payer: Medicare PPO | Admitting: Physical Therapy

## 2022-04-11 DIAGNOSIS — R2689 Other abnormalities of gait and mobility: Secondary | ICD-10-CM

## 2022-04-11 DIAGNOSIS — M6281 Muscle weakness (generalized): Secondary | ICD-10-CM

## 2022-04-11 DIAGNOSIS — R2681 Unsteadiness on feet: Secondary | ICD-10-CM

## 2022-04-11 NOTE — Therapy (Signed)
OUTPATIENT PHYSICAL THERAPY TREATMENT NOTE   Patient Name: Pamela Patton MRN: [id number] DOB:Jul 07, 1946, 76 y.o., female Today's Date: 04/11/2022  PCP: Caren Macadam, MD REFERRING PROVIDER: Ludwig Clarks, DO      END OF SESSION:   PT End of Session - 04/11/22 1236     Visit Number 12    Number of Visits 17    Date for PT Re-Evaluation 05/26/22    Authorization Type [employer]    Authorization Time Period Approved 16 PT visits 03/03/22 - 05/26/22    Progress Note Due on Visit 65    PT Start Time 8101   Pt arrived late   PT Stop Time 1316    PT Time Calculation (min) 41 min    Activity Tolerance Patient tolerated treatment well    Behavior During Therapy [facility] for tasks assessed/performed              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   Carotid artery disease 03/21/2017   Cataract    immature    Chest pain summer 2012   Chicken pox    CKD (chronic kidney disease) 10/08/2012   Stage 3, GFR 30-59 ml/min   Diverticulosis    difficulty with colonoscopy 2005   DM (diabetes mellitus), type 2 with renal complications 75/07/2584   Fracture    radial/ulnar 03/2016   GERD (gastroesophageal reflux disease)    Heart murmur    innocent murmur   High cholesterol    Hypertension associated with diabetes 02/22/2017   Hyperuricemia 10/31/2016   Hypothyroidism    s/p radioactive iodine tx for graves in 2001   Intestinal metaplasia of gastric mucosa    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   Parkinson's disease 08/08/2016   UTI (urinary tract infection)    Vitamin D deficiency 08/24/2012   Weight loss    eval with GI in 2016; s/p CT/MRI and PET scans   Past Surgical History:  Procedure Laterality Date   ABDOMINAL HYSTERECTOMY  1989   CATARACT EXTRACTION, BILATERAL     COLONOSCOPY     DG BARIUM SWALLOW ([place] HX)     9 yrs ago per pt due to incomplete colonsocopy   Patient Active Problem  List   Diagnosis Date Noted   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) 02/22/2017   Hyperuricemia 10/31/2016   PD (Parkinson's disease) ([place]) 08/08/2016   Loss of weight 07/25/2016   DM (diabetes mellitus), type 2 with renal complications 27/78/2423   CKD (chronic kidney disease) stage 3, GFR 30-59 ml/min ([place]) 10/08/2012   Vitamin D deficiency 08/24/2012   Hypothyroid, s/p radioactive iodine tx for graves disease     REFERRING DIAG: G20 (ICD-10-CM) - Parkinson's disease ([place])   THERAPY DIAG:  Other abnormalities of gait and mobility  Muscle weakness (generalized)  Unsteadiness on feet  Rationale for Evaluation and Treatment Rehabilitation  PERTINENT HISTORY: Diabetes mellitus type 2 with hyperglycemia, with long-term current use of insulin, stage 3b chronic kidney disease and hypertension, Diabetic mild nonproliferative retinopathy, both eyes, pressure callus, Dyslipidemia associated with type 2 diabetes mellitus, Hypothyroidism, Thyroid ophthalmopath, Hyperuricemia, Vitamin D deficiency.  PRECAUTIONS: fall  SUBJECTIVE: No new changes or falls, pt reports she thought about walking this weekend but  the weather deterred her.   PAIN:  Are you having pain? No    TODAY'S TREATMENT:  Self-care/home management  Discussed POC moving forward, as pt only scheduled through next week. Pt initially reported interest in extending POC, but stated she does not have transportation. When inquiring as to why pt feels she needs more therapy, pt stated she feels like she can move better in clinic than at home. Discussed potential reasons for this and informed pt about her fear-avoidance behavior. Pt acknowledged lack of confidence in her abilities but stated she would perform walking program this  week prior to DC from therapy as this was her initial goal.   Educated pt on community PD resources, including the [employer] moves class/rock steady boxing and joining the [employer]. [facility] stated [facility] and [facility] that [facility]. Did not provide handout of PD resources as [place] side [place] and was not interested at this time.    Gait Training  The following treadmill training was completed for aerobic/neural priming, endurance, and gait speed.  - Warmup: 2:00 up to 2.1 mph - HIIT: 5:00 30sec ON/OFF alternating 3.3 mph / 2.1 mph. Noted improved eccentric control of L foot from initial contact to midstance throughout compared to previous session - Cool Down: 90sec at 2.1 mph   NMR  Alt fwd step over 2" foam beam to various colored dot (therapist calling out color) w/OH slam ball using 2KG ball for stepping strategy practice, lateral weight shifting and set-switching. Pt performed x10 reps per side w/CGA-min A for steadying assist. Noted increased difficulty when stepping w/LLE > RLE. Min cues throughout to remember to slam ball from [facility].     PATIENT EDUCATION: Education details: Continued encouragement to perform walking program at home, plan to DC next week  Person educated: Patient Education method: Customer service manager Education comprehension: verbalized understanding and needs further education     HOME EXERCISE PROGRAM: Access Code: [email or web address] URL: [email or web address] Date: 03/23/2022 Prepared by: Mickie Bail Archana Eckman  Exercises - Standing on old pillows with eyes closed and feet close together   - 1 x daily - 7 x weekly - 3 sets - 30 second hold - Semi-tandem corner balance with eyes closed   - 1 x daily - 7 x weekly - 3 sets - 30 second hold - Seated Long Arc Quad  - 1 x daily - 7 x weekly - 3 sets - 10 reps - Seated Hamstring Stretch  - 1 x daily - 7 x weekly - 3 sets - 10 reps - Sit to Stand  - 1 x daily - 7  x weekly - 3 sets - 10 reps     GOALS: Goals reviewed with patient? Yes   SHORT TERM GOALS: Target date: 03/31/2022   Pt will be independent with initial PD-specific HEP for improved strength, balance, transfers and gait.   Baseline: More confident with HEP, performing standing exercises as well Goal status: MET   2.  Pt will improve MiniBest to 15/28 for decreased fall risk and improvement with compensatory stepping strategies.   Baseline: MiniBest started but not completed on eval; MiniBest 12/28 (6/5), 20/28 Goal status: MET   3.  Pt will improve 5 x STS to less than or equal to 25 seconds without UE support and ability to stand on first attempt to demonstrate improved functional strength and transfer efficiency.    Baseline: 27.03s w/UE support on  reps 2-5, multiple attempts to stand each rep; 17.46sec Goal status: MET   4.  Pt will initiate walking program in community w/LRAD for return to PLOF and improved cardiovascular endurance  Baseline: Patient not walking due to being too busy Goal status: NOT MET     LONG TERM GOALS: Target date: 04/28/2022   Pt will verbalize understanding of local PD community resources, including fitness post DC.    Baseline:  Goal status: INITIAL   2.  Pt will be independent with final PD-specific HEP for improved strength, balance, transfers and gait. Baseline:  Goal status: INITIAL   3.  Patient will improve MiniBESTest score to >/=23/28 in order to demonstrate a decreased risk for falling.  Baseline: 12/28; 20/28 on 6/26 Goal status: REVISED   4.  Pt will improve gait velocity to at least 3.7 ft/s with LRAD for improved gait efficiency    Baseline: 3.72f/s without AD Goal status: INITIAL   5.  Pt will improve 5 x STS to less than or equal to 15 seconds without UE support to demonstrate improved functional strength and transfer efficiency.    Baseline: 27.03s w/UE support on reps 2-5 Goal status: INITIAL     ASSESSMENT:    CLINICAL IMPRESSION: Emphasis of skilled PT session on gait speed, set-switching and stepping strategies. Pt demonstrated increased difficulty w/retro stepping, requiring min A to stabilize due to crossover step pattern. Pt verbalizes understanding that she is not confident in her locomotion level and stated she would work on walking program this week. Continue POC.    OBJECTIVE IMPAIRMENTS Abnormal gait, decreased activity tolerance, decreased balance, decreased endurance, decreased knowledge of use of DME, decreased mobility, difficulty walking, decreased ROM, and decreased strength.    ACTIVITY LIMITATIONS lifting, standing, squatting, stairs, and transfers   PARTICIPATION LIMITATIONS: cleaning, laundry, driving, and community activity   PERSONAL FACTORS Age, Fitness, Past/current experiences, Time since onset of injury/illness/exacerbation, Transportation, and 1 comorbidity: DM 2  are also affecting patient's functional outcome.    REHAB POTENTIAL: Good   CLINICAL DECISION MAKING: Stable/uncomplicated   EVALUATION COMPLEXITY: Low   PLAN: PT FREQUENCY: 2x/week   PT DURATION: 8 weeks   PLANNED INTERVENTIONS: Therapeutic exercises, Therapeutic activity, Neuromuscular re-education, Balance training, Gait training, Patient/Family education, Stair training, DME instructions, and Re-evaluation   PLAN FOR NEXT SESSION: ball slam progression, resisted step ups, STS practice, stepping strategy re-training, dual tasking, treadmill training, dot drills, resisted walking    Eugean Arnott E Brenten Janney, PT, DPT 04/11/2022, 1:27 PM

## 2022-04-13 ENCOUNTER — Ambulatory Visit: Payer: Medicare PPO | Admitting: Physical Therapy

## 2022-04-13 DIAGNOSIS — R2681 Unsteadiness on feet: Secondary | ICD-10-CM

## 2022-04-13 DIAGNOSIS — R2689 Other abnormalities of gait and mobility: Secondary | ICD-10-CM

## 2022-04-13 DIAGNOSIS — M6281 Muscle weakness (generalized): Secondary | ICD-10-CM

## 2022-04-13 NOTE — Therapy (Signed)
OUTPATIENT PHYSICAL THERAPY TREATMENT NOTE   Patient Name: Pamela Patton MRN: [id number] DOB:[birth year], 76 y.o., female Today's Date: 04/13/2022  PCP: Caren Macadam, MD REFERRING PROVIDER: Ludwig Clarks, DO      END OF SESSION:   PT End of Session - 04/13/22 1233     Visit Number 13    Number of Visits 17    Date for PT Re-Evaluation 05/26/22    Authorization Type [employer]    Authorization Time Period Approved 16 PT visits 03/03/22 - 05/26/22    Progress Note Due on Visit 20    PT Start Time 1231    PT Stop Time 1313    PT Time Calculation (min) 42 min    Equipment Utilized During Treatment Gait belt    Activity Tolerance Patient tolerated treatment well    Behavior During Therapy WFL for tasks assessed/performed               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   Carotid artery disease 03/21/2017   Cataract    immature    Chest pain summer 2012   Chicken pox    CKD (chronic kidney disease) 10/08/2012   Stage 3, GFR 30-59 ml/min   Diverticulosis    difficulty with colonoscopy 2005   DM (diabetes mellitus), type 2 with renal complications 54/27/0623   Fracture    radial/ulnar 03/2016   GERD (gastroesophageal reflux disease)    Heart murmur    innocent murmur   High cholesterol    Hypertension associated with diabetes 02/22/2017   Hyperuricemia 10/31/2016   Hypothyroidism    s/p radioactive iodine tx for graves in 2001   Intestinal metaplasia of gastric mucosa    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   Parkinson's disease 08/08/2016   UTI (urinary tract infection)    Vitamin D deficiency 08/24/2012   Weight loss    eval with GI in 2016; s/p CT/MRI and PET scans   Past Surgical History:  Procedure Laterality Date   ABDOMINAL HYSTERECTOMY  1989   CATARACT EXTRACTION, BILATERAL     COLONOSCOPY     DG BARIUM SWALLOW ([place] HX)     9 yrs ago per pt due to incomplete  colonsocopy   Patient Active Problem List   Diagnosis Date Noted   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   Carotid artery disease ([place]) 03/21/2017   Hypertension associated with diabetes ([place]) 02/22/2017   Hyperlipidemia associated with type 2 diabetes mellitus ([place]) 02/22/2017   Hyperuricemia 10/31/2016   PD (Parkinson's disease) ([place]) 08/08/2016   Loss of weight 07/25/2016   DM (diabetes mellitus), type 2 with renal complications 76/28/3151   CKD (chronic kidney disease) stage 3, GFR 30-59 ml/min ([place]) 10/08/2012   Vitamin D deficiency 08/24/2012   Hypothyroid, s/p radioactive iodine tx for graves disease     REFERRING DIAG: G20 (ICD-10-CM) - Parkinson's disease ([place])   THERAPY DIAG:  Other abnormalities of gait and mobility  Unsteadiness on feet  Muscle weakness (generalized)  Rationale for Evaluation and Treatment Rehabilitation  PERTINENT HISTORY: Diabetes mellitus type 2 with hyperglycemia, with long-term current use of insulin, stage 3b chronic kidney disease and hypertension, Diabetic mild nonproliferative retinopathy, both eyes, pressure callus, Dyslipidemia associated with type 2 diabetes mellitus, Hypothyroidism, Thyroid ophthalmopath, Hyperuricemia, Vitamin D deficiency.  PRECAUTIONS: fall  SUBJECTIVE: Pt reports she is doing well. Has not  performed walking program, states she is on the opposite schedule as her sister and does not want to walk unless she can talk to sister on the phone. Unable to go to an indoor track due to lack of transportation. Pt reports she stubbed her toe, "I have too many shoes laying around". Denies pain.   PAIN:  Are you having pain? No    TODAY'S TREATMENT:  Gait Training  The following treadmill training was completed for aerobic/neural priming, endurance, step clearance and gait speed.  - Warmup: 2:00 up to 2.3 mph - HIIT: 5:00 30sec ON/OFF alternating 3% and 0% incline at 2.3 mph. Pt adapted  to changes in incline well, no shuffling or catching of feet noted.  - Cool Down: 90sec at 2.3 mph and 0% incline   NMR  Forward step to target (colored dot) w/ball throw at rebounder for improved stepping strategies, visual tracking, dual-tasking and lateral weight shifting. Pt performed x10 reps per side w/CGA-min A, mod verbal cues to remind pt of sequence ("step, throw") as she frequently would throw instead of step. Added cog dual-task (naming veggie when stepping w/LLE, fruit w/RLE) and pt unable to maintain sequence w/added task and frequently mixed up fruits and veggies or repeated the same word despite cues for a new word.     PATIENT EDUCATION: Education details: Continued encouragement to perform walking program at home (going to try walking back and forth in townhouse parking lot), plan to DC next week  Person educated: Patient Education method: Customer service manager Education comprehension: verbalized understanding and needs further education     HOME EXERCISE PROGRAM: Access Code: [email or web address] URL: [email or web address] Date: 03/23/2022 Prepared by: Mickie Bail   Exercises - Standing on old pillows with eyes closed and feet close together   - 1 x daily - 7 x weekly - 3 sets - 30 second hold - Semi-tandem corner balance with eyes closed   - 1 x daily - 7 x weekly - 3 sets - 30 second hold - Seated Long Arc Quad  - 1 x daily - 7 x weekly - 3 sets - 10 reps - Seated Hamstring Stretch  - 1 x daily - 7 x weekly - 3 sets - 10 reps - Sit to Stand  - 1 x daily - 7 x weekly - 3 sets - 10 reps     GOALS: Goals reviewed with patient? Yes   SHORT TERM GOALS: Target date: 03/31/2022   Pt will be independent with initial PD-specific HEP for improved strength, balance, transfers and gait.   Baseline: More confident with HEP, performing standing exercises as well Goal status: MET   2.  Pt will improve MiniBest to 15/28 for decreased fall risk and improvement  with compensatory stepping strategies.   Baseline: MiniBest started but not completed on eval; MiniBest 12/28 (6/5), 20/28 Goal status: MET   3.  Pt will improve 5 x STS to less than or equal to 25 seconds without UE support and ability to stand on first attempt to demonstrate improved functional strength and transfer efficiency.    Baseline: 27.03s w/UE support on reps 2-5, multiple attempts to stand each rep; 17.46sec Goal status: MET   4.  Pt will initiate walking program in community w/LRAD for return to PLOF and improved cardiovascular endurance  Baseline: Patient not walking due to being too busy Goal status: NOT MET     LONG TERM GOALS: Target date: 04/28/2022   Pt will verbalize understanding of local PD community  resources, including fitness post DC.    Baseline:  Goal status: INITIAL   2.  Pt will be independent with final PD-specific HEP for improved strength, balance, transfers and gait. Baseline:  Goal status: INITIAL   3.  Patient will improve MiniBESTest score to >/=23/28 in order to demonstrate a decreased risk for falling.  Baseline: 12/28; 20/28 on 6/26 Goal status: REVISED   4.  Pt will improve gait velocity to at least 3.7 ft/s with LRAD for improved gait efficiency    Baseline: 3.81f/s without AD Goal status: INITIAL   5.  Pt will improve 5 x STS to less than or equal to 15 seconds without UE support to demonstrate improved functional strength and transfer efficiency.    Baseline: 27.03s w/UE support on reps 2-5 Goal status: INITIAL     ASSESSMENT:   CLINICAL IMPRESSION: Emphasis of skilled PT session on step clearance, dual-tasking and stepping strategies. Pt reports she has not started walking program for multiple reasons, transportation being one. Therapist advising her to walk in parking lot of townhouse as she is comfortable with the layout and does not require transportation. Pt continues to demonstrate difficulty w/cog dual-tasking and  sequencing, requiring mod verbal cues for sequencing tasks. Continue POC.    OBJECTIVE IMPAIRMENTS Abnormal gait, decreased activity tolerance, decreased balance, decreased endurance, decreased knowledge of use of DME, decreased mobility, difficulty walking, decreased ROM, and decreased strength.    ACTIVITY LIMITATIONS lifting, standing, squatting, stairs, and transfers   PARTICIPATION LIMITATIONS: cleaning, laundry, driving, and community activity   PERSONAL FACTORS Age, Fitness, Past/current experiences, Time since onset of injury/illness/exacerbation, Transportation, and 1 comorbidity: DM 2  are also affecting patient's functional outcome.    REHAB POTENTIAL: Good   CLINICAL DECISION MAKING: Stable/uncomplicated   EVALUATION COMPLEXITY: Low   PLAN: PT FREQUENCY: 2x/week   PT DURATION: 8 weeks   PLANNED INTERVENTIONS: Therapeutic exercises, Therapeutic activity, Neuromuscular re-education, Balance training, Gait training, Patient/Family education, Stair training, DME instructions, and Re-evaluation   PLAN FOR NEXT SESSION: Start goal assessment, ball slam progression, resisted step ups, STS practice, stepping strategy re-training, dual tasking, treadmill training, dot drills, resisted walking    Myya Meenach E Darious Rehman, PT, DPT 04/13/2022, 1:17 PM

## 2022-04-18 ENCOUNTER — Ambulatory Visit: Payer: Medicare PPO | Admitting: Physical Therapy

## 2022-04-18 DIAGNOSIS — R2689 Other abnormalities of gait and mobility: Secondary | ICD-10-CM

## 2022-04-18 DIAGNOSIS — R2681 Unsteadiness on feet: Secondary | ICD-10-CM

## 2022-04-18 DIAGNOSIS — M6281 Muscle weakness (generalized): Secondary | ICD-10-CM

## 2022-04-18 NOTE — Therapy (Signed)
OUTPATIENT PHYSICAL THERAPY TREATMENT NOTE   Patient Name: Pamela Patton MRN: [id number] DOB:05-02-1946, 76 y.o., female Today's Date: 04/18/2022  PCP: Caren Macadam, MD REFERRING PROVIDER: Ludwig Clarks, DO      END OF SESSION:   PT End of Session - 04/18/22 1325     Visit Number 14    Number of Visits 17    Date for PT Re-Evaluation 05/26/22    Authorization Type [employer]    Authorization Time Period Approved 16 PT visits 03/03/22 - 05/26/22    Progress Note Due on Visit 48    PT Start Time 1324   Pt arrived late   PT Stop Time 1400    PT Time Calculation (min) 36 min    Activity Tolerance Patient tolerated treatment well    Behavior During Therapy [facility] for tasks assessed/performed               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   Carotid artery disease 03/21/2017   Cataract    immature    Chest pain summer 2012   Chicken pox    CKD (chronic kidney disease) 10/08/2012   Stage 3, GFR 30-59 ml/min   Diverticulosis    difficulty with colonoscopy 2005   DM (diabetes mellitus), type 2 with renal complications 08/05/1593   Fracture    radial/ulnar 03/2016   GERD (gastroesophageal reflux disease)    Heart murmur    innocent murmur   High cholesterol    Hypertension associated with diabetes 02/22/2017   Hyperuricemia 10/31/2016   Hypothyroidism    s/p radioactive iodine tx for graves in 2001   Intestinal metaplasia of gastric mucosa    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   Parkinson's disease 08/08/2016   UTI (urinary tract infection)    Vitamin D deficiency 08/24/2012   Weight loss    eval with GI in 2016; s/p CT/MRI and PET scans   Past Surgical History:  Procedure Laterality Date   ABDOMINAL HYSTERECTOMY  1989   CATARACT EXTRACTION, BILATERAL     COLONOSCOPY     DG BARIUM SWALLOW ([place] HX)     9 yrs ago per pt due to incomplete colonsocopy   Patient Active Problem  List   Diagnosis Date Noted   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   Carotid artery disease ([place]) 03/21/2017   Hypertension associated with diabetes ([place]) 02/22/2017   Hyperlipidemia associated with type 2 diabetes mellitus ([place]) 02/22/2017   Hyperuricemia 10/31/2016   PD (Parkinson's disease) ([place]) 08/08/2016   Loss of weight 07/25/2016   DM (diabetes mellitus), type 2 with renal complications 58/59/2924   CKD (chronic kidney disease) stage 3, GFR 30-59 ml/min ([place]) 10/08/2012   Vitamin D deficiency 08/24/2012   Hypothyroid, s/p radioactive iodine tx for graves disease     REFERRING DIAG: G20 (ICD-10-CM) - Parkinson's disease ([place])   THERAPY DIAG:  Other abnormalities of gait and mobility  Unsteadiness on feet  Muscle weakness (generalized)  Rationale for Evaluation and Treatment Rehabilitation  PERTINENT HISTORY: Diabetes mellitus type 2 with hyperglycemia, with long-term current use of insulin, stage 3b chronic kidney disease and hypertension, Diabetic mild nonproliferative retinopathy, both eyes, pressure callus, Dyslipidemia associated with type 2 diabetes mellitus, Hypothyroidism, Thyroid ophthalmopath, Hyperuricemia, Vitamin D deficiency.  PRECAUTIONS: fall  SUBJECTIVE: Pt reports she has not been walking. "I did not see many people  out this weekend". Pt reports she was working on her balance by standing w/narrow BOS by her bed and taking her pills with her eyes closed. No falls or changes   PAIN:  Are you having pain? No    TODAY'S TREATMENT:  Ther Act  LTG assessment    [facility] PT Assessment - 04/18/22 1335       Transfers   Five time sit to stand comments  18.91s without UE support      Ambulation/Gait   Gait velocity 32.8' over 8.94s = 3.66 ft/s without AD            Self-care/home management  Discussed goal outcome and provided pt with flyer listing community PD resources ([employer] moves class, [employer],  [employer] cycling class). Pt very interested in classes but states she would need to get more transportation through [facility] first. Strongly encouraging pt to participate in some type of fitness post-DC to maintain functional gains made in PT and to slow progression of PD as backed by EBP. Pt verbalized understanding but stated it may take her 26mo to start exercising.   Ther Ex SciFit multi-peaks level 8 for 8 minutes using BUE/BLEs for dynamic cardiovascular conditioning, neural priming for reciprocal movement and global strengthening. Noted genu valgus throughout bilaterally     PATIENT EDUCATION: Education details: Goal assessment, advised pt against taking her medication w/her eyes closed and narrow BOS, as this is unstable/unsafe. Provided handout for community PD resources   Person educated: Patient Education method: Explanation and Demonstration Education comprehension: verbalized understanding and needs further education     HOME EXERCISE PROGRAM: Access Code: [email or web address] URL: [email or web address] Date: 03/23/2022 Prepared by: Mickie Bail Chrysta Fulcher  Exercises - Standing on old pillows with eyes closed and feet close together   - 1 x daily - 7 x weekly - 3 sets - 30 second hold - Semi-tandem corner balance with eyes closed   - 1 x daily - 7 x weekly - 3 sets - 30 second hold - Seated Long Arc Quad  - 1 x daily - 7 x weekly - 3 sets - 10 reps - Seated Hamstring Stretch  - 1 x daily - 7 x weekly - 3 sets - 10 reps - Sit to Stand  - 1 x daily - 7 x weekly - 3 sets - 10 reps     GOALS: Goals reviewed with patient? Yes   SHORT TERM GOALS: Target date: 03/31/2022   Pt will be independent with initial PD-specific HEP for improved strength, balance, transfers and gait.   Baseline: More confident with HEP, performing standing exercises as well Goal status: MET   2.  Pt will improve MiniBest to 15/28 for decreased fall risk and improvement with compensatory stepping strategies.    Baseline: MiniBest started but not completed on eval; MiniBest 12/28 (6/5), 20/28 Goal status: MET   3.  Pt will improve 5 x STS to less than or equal to 25 seconds without UE support and ability to stand on first attempt to demonstrate improved functional strength and transfer efficiency.    Baseline: 27.03s w/UE support on reps 2-5, multiple attempts to stand each rep; 17.46sec Goal status: MET   4.  Pt will initiate walking program in community w/LRAD for return to PLOF and improved cardiovascular endurance  Baseline: Patient not walking due to being too busy Goal status: NOT MET     LONG TERM GOALS: Target date: 04/28/2022   Pt will verbalize understanding of local PD community  resources, including fitness post DC.    Baseline: Provided handout on 7/17  Goal status: MET   2.  Pt will be independent with final PD-specific HEP for improved strength, balance, transfers and gait. Baseline:  Goal status: INITIAL   3.  Patient will improve MiniBESTest score to >/=23/28 in order to demonstrate a decreased risk for falling.  Baseline: 12/28; 20/28 on 6/26 Goal status: REVISED   4.  Pt will improve gait velocity to at least 3.7 ft/s with LRAD for improved gait efficiency    Baseline: 3.12f/s without AD; 3.66 ft/s without AD Goal status: PARTIALLY MET    5.  Pt will improve 5 x STS to less than or equal to 15 seconds without UE support to demonstrate improved functional strength and transfer efficiency.    Baseline: 27.03s w/UE support on reps 2-5; 18.91s without UE support  Goal status: PARTIALLY MET      ASSESSMENT:   CLINICAL IMPRESSION: Emphasis of skilled PT session on LTG assessment and discussing community PD resources for pt to use post-DC. PT has partially met 2/5 LTGs so far and met 1/5 goals, improving her gait speed to 3.6 ft/s, hardly short of her goal of 3.7 ft/s and meeting her goal of community PD resources. Pt performed 5x STS in 18s, 3s over her goal, but did  not need to use UE support for the first time. Pt states she is limited to participate in [employer] class due to lack of transportation but is working on getting more through her insurance. Pt verbalized agreement to DC from therapy next session and will schedule 659moD screens. Continue POC.    OBJECTIVE IMPAIRMENTS Abnormal gait, decreased activity tolerance, decreased balance, decreased endurance, decreased knowledge of use of DME, decreased mobility, difficulty walking, decreased ROM, and decreased strength.    ACTIVITY LIMITATIONS lifting, standing, squatting, stairs, and transfers   PARTICIPATION LIMITATIONS: cleaning, laundry, driving, and community activity   PERSONAL FACTORS Age, Fitness, Past/current experiences, Time since onset of injury/illness/exacerbation, Transportation, and 1 comorbidity: DM 2  are also affecting patient's functional outcome.    REHAB POTENTIAL: Good   CLINICAL DECISION MAKING: Stable/uncomplicated   EVALUATION COMPLEXITY: Low   PLAN: PT FREQUENCY: 2x/week   PT DURATION: 8 weeks   PLANNED INTERVENTIONS: Therapeutic exercises, Therapeutic activity, Neuromuscular re-education, Balance training, Gait training, Patient/Family education, Stair training, DME instructions, and Re-evaluation   PLAN FOR NEXT SESSION: Did pt schedule 61m51mo screen? finish goal assessment, ball slam progression, resisted step ups, STS practice, stepping strategy re-training, dual tasking, treadmill training, dot drills, resisted walking    Jessia Kief E Santo Zahradnik, PT, DPT 04/18/2022, 2:02 PM

## 2022-04-20 ENCOUNTER — Ambulatory Visit: Payer: Medicare PPO | Admitting: Physical Therapy

## 2022-04-20 DIAGNOSIS — M6281 Muscle weakness (generalized): Secondary | ICD-10-CM

## 2022-04-20 DIAGNOSIS — R2689 Other abnormalities of gait and mobility: Secondary | ICD-10-CM

## 2022-04-20 DIAGNOSIS — R2681 Unsteadiness on feet: Secondary | ICD-10-CM

## 2022-04-20 NOTE — Therapy (Signed)
OUTPATIENT PHYSICAL THERAPY TREATMENT NOTE- DISCHARGE SUMMARY   Patient Name: Pamela Patton MRN: [id number] DOB:21-Jul-1946, 76 y.o., female Today's Date: 04/20/2022  PCP: Caren Macadam, MD REFERRING PROVIDER: [name], Eustace Quail, DO   PHYSICAL THERAPY DISCHARGE SUMMARY  Visits from Start of Care: 15  Current functional level related to goals / functional outcomes: See below   Remaining deficits: Impaired gait kinematics 2/2 PD, decreased safety awareness, fear avoidance behavior   Education / Equipment: HEP, handout on community PD resources   Patient agrees to discharge. Patient goals were partially met. Patient is being discharged due to maximized rehab potential.       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   Carotid artery disease 03/21/2017   Cataract    immature    Chest pain summer 2012   Chicken pox    CKD (chronic kidney disease) 10/08/2012   Stage 3, GFR 30-59 ml/min   Diverticulosis    difficulty with colonoscopy 2005   DM (diabetes mellitus), type 2 with renal complications 38/07/1750   Fracture    radial/ulnar 03/2016   GERD (gastroesophageal reflux disease)    Heart murmur    innocent murmur   High cholesterol    Hypertension associated with diabetes 02/22/2017   Hyperuricemia 10/31/2016   Hypothyroidism    s/p radioactive iodine tx for graves in 2001   Intestinal metaplasia of gastric  mucosa    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   Parkinson's disease 08/08/2016   UTI (urinary tract infection)    Vitamin D deficiency 08/24/2012   Weight loss    eval with GI in 2016; s/p CT/MRI and PET scans   Past Surgical History:  Procedure Laterality Date   ABDOMINAL HYSTERECTOMY  1989   CATARACT EXTRACTION, BILATERAL     COLONOSCOPY     DG BARIUM SWALLOW ([place] HX)     9 yrs ago per pt due to incomplete colonsocopy   Patient Active Problem List   Diagnosis Date Noted   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   Carotid artery disease ([place]) 03/21/2017   Hypertension associated with diabetes ([place]) 02/22/2017   Hyperlipidemia associated with type 2 diabetes mellitus ([place]) 02/22/2017   Hyperuricemia 10/31/2016   PD (Parkinson's disease) ([place]) 08/08/2016   Loss of weight 07/25/2016   DM (diabetes mellitus), type 2 with renal complications 02/58/5277   CKD (chronic kidney disease) stage 3, GFR 30-59 ml/min ([place]) 10/08/2012   Vitamin D deficiency 08/24/2012   Hypothyroid, s/p radioactive iodine tx for graves disease     REFERRING DIAG: G20 (ICD-10-CM) - Parkinson's disease ([place])   THERAPY DIAG:  Other abnormalities of gait and mobility  Unsteadiness on feet  Muscle  weakness (generalized)  Rationale for Evaluation and Treatment Rehabilitation  PERTINENT HISTORY: Diabetes mellitus type 2 with hyperglycemia, with long-term current use of insulin, stage 3b chronic kidney disease and hypertension, Diabetic mild nonproliferative retinopathy, both eyes, pressure callus, Dyslipidemia associated with type 2 diabetes mellitus, Hypothyroidism, Thyroid ophthalmopath, Hyperuricemia, Vitamin D deficiency.  PRECAUTIONS: fall  SUBJECTIVE: Pt reports she has not been walking. "I stuck my head out the door this morning and my neighbor was walking their [place]". "I do the pedaling which keeps my legs moving".  PAIN:  Are you having pain?  No    TODAY'S TREATMENT:  Ther Act  LTG assessment (flowsheets not pulling through)  MiniBest: 21/28 Normal TUG: 12.06s  Cog TUG: 15.28 (retro counting by 3s starting at 62)     PATIENT EDUCATION: Education details: Goal assessment, outcomes, scheduling a 6 month PD screen, obtaining new referral for PT if mobility needs change, continued to encourage high-intensity exercise post DC to slow progression of PD based on evidence-based practice Person educated: Patient Education method: Explanation and Demonstration Education comprehension: verbalized understanding     HOME EXERCISE PROGRAM: Access Code: [email or web address] URL: [email or web address] Date: 03/23/2022 Prepared by: Mickie Bail Bastion Bolger  Exercises - Standing on old pillows with eyes closed and feet close together   - 1 x daily - 7 x weekly - 3 sets - 30 second hold - Semi-tandem corner balance with eyes closed   - 1 x daily - 7 x weekly - 3 sets - 30 second hold - Seated Long Arc Quad  - 1 x daily - 7 x weekly - 3 sets - 10 reps - Seated Hamstring Stretch  - 1 x daily - 7 x weekly - 3 sets - 10 reps - Sit to Stand  - 1 x daily - 7 x weekly - 3 sets - 10 reps     GOALS: Goals reviewed with patient? Yes   SHORT TERM GOALS: Target date: 03/31/2022   Pt will be independent with initial PD-specific HEP for improved strength, balance, transfers and gait.   Baseline: More confident with HEP, performing standing exercises as well Goal status: MET   2.  Pt will improve MiniBest to 15/28 for decreased fall risk and improvement with compensatory stepping strategies.   Baseline: MiniBest started but not completed on eval; MiniBest 12/28 (6/5), 20/28 Goal status: MET   3.  Pt will improve 5 x STS to less than or equal to 25 seconds without UE support and ability to stand on first attempt to demonstrate improved functional strength and transfer efficiency.    Baseline: 27.03s w/UE support on reps 2-5, multiple attempts to  stand each rep; 17.46sec Goal status: MET   4.  Pt will initiate walking program in community w/LRAD for return to PLOF and improved cardiovascular endurance  Baseline: Patient not walking due to being too busy Goal status: NOT MET     LONG TERM GOALS: Target date: 04/28/2022   Pt will verbalize understanding of local PD community resources, including fitness post DC.    Baseline: Provided handout on 7/17  Goal status: MET   2.  Pt will be independent with final PD-specific HEP for improved strength, balance, transfers and gait. Baseline:  Goal status: MET   3.  Patient will improve MiniBESTest score to >/=23/28 in order to demonstrate a decreased risk for falling.  Baseline: 12/28; 20/28 on 6/26; 21/28 on 7/19 Goal status: NOT MET   4.  Pt will improve gait velocity to at  least 3.7 ft/s with LRAD for improved gait efficiency    Baseline: 3.83f/s without AD; 3.66 ft/s without AD Goal status: PARTIALLY MET    5.  Pt will improve 5 x STS to less than or equal to 15 seconds without UE support to demonstrate improved functional strength and transfer efficiency.    Baseline: 27.03s w/UE support on reps 2-5; 18.91s without UE support  Goal status: PARTIALLY MET      ASSESSMENT:   CLINICAL IMPRESSION: Emphasis of skilled PT session on LTG assessment and discussing community PD resources for pt to use post-DC. Reassessed MiniBest with pt showing improvement in score from 20/28 on 6/26 to 21/28 on 7/19. Encouraged pt to continue HEP, walking outdoors and in the community, and to return for 6 month check-in appointment. Also reiterated importance of continuing to exercise and engage in physical activity as well as encouraged her to join local PD classes. Pt verbalized agreement to d/c from PT services this date and verbalized understanding of importance of continuing to engage in fitness post d/c.    OBJECTIVE IMPAIRMENTS Abnormal gait, decreased activity tolerance, decreased balance,  decreased endurance, decreased knowledge of use of DME, decreased mobility, difficulty walking, decreased ROM, and decreased strength.    ACTIVITY LIMITATIONS lifting, standing, squatting, stairs, and transfers   PARTICIPATION LIMITATIONS: cleaning, laundry, driving, and community activity   PERSONAL FACTORS Age, Fitness, Past/current experiences, Time since onset of injury/illness/exacerbation, Transportation, and 1 comorbidity: DM 2  are also affecting patient's functional outcome.    REHAB POTENTIAL: Good   CLINICAL DECISION MAKING: Stable/uncomplicated   EVALUATION COMPLEXITY: Low   PLAN: PT FREQUENCY: 2x/week   PT DURATION: 8 weeks   PLANNED INTERVENTIONS: Therapeutic exercises, Therapeutic activity, Neuromuscular re-education, Balance training, Gait training, Patient/Family education, Stair training, DME instructions, and Re-evaluation      E , PT, DPT 04/20/2022, 1:08 PM

## 2022-04-21 DIAGNOSIS — Z0279 Encounter for issue of other medical certificate: Secondary | ICD-10-CM

## 2022-04-25 ENCOUNTER — Other Ambulatory Visit: Payer: Self-pay | Admitting: Urology

## 2022-04-25 DIAGNOSIS — D3000 Benign neoplasm of unspecified kidney: Secondary | ICD-10-CM

## 2022-04-27 ENCOUNTER — Telehealth: Payer: Self-pay

## 2022-04-27 NOTE — Telephone Encounter (Signed)
Called patient and left a message for a call back. Need to inform patient that her paperwork has been completed and is ready for pick up. Paperwork has been placed up front for pick up.

## 2022-05-02 ENCOUNTER — Ambulatory Visit: Payer: Medicare PPO

## 2022-05-02 DIAGNOSIS — Z794 Long term (current) use of insulin: Secondary | ICD-10-CM

## 2022-05-02 DIAGNOSIS — E1129 Type 2 diabetes mellitus with other diabetic kidney complication: Secondary | ICD-10-CM

## 2022-05-02 DIAGNOSIS — G20 Parkinson's disease: Secondary | ICD-10-CM

## 2022-05-02 DIAGNOSIS — G20A1 Parkinson's disease without dyskinesia, without mention of fluctuations: Secondary | ICD-10-CM

## 2022-05-02 NOTE — Chronic Care Management (AMB) (Signed)
Care Management    RN Visit Note  05/02/2022 Name: Pamela Patton MRN: [id number] DOB: 26-Nov-1945  Subjective: Pamela Patton is a 76 y.o. year old female who is a primary care patient of Koberlein, Steele Berg, MD (Inactive). The care management team was consulted for assistance with disease management and care coordination needs.    Engaged with patient by telephone for follow up visit in response to provider referral for case management and/or care coordination services.   Consent to Services:   [name] was given information about Care Management services today including:  Care Management services includes personalized support from designated clinical staff supervised by her physician, including individualized plan of care and coordination with other care providers 24/7 contact phone numbers for assistance for urgent and routine care needs. The patient may stop case management services at any time by phone call to the office staff.  Patient agreed to services and consent obtained.   Assessment: Review of patient past medical history, allergies, medications, health status, including review of consultants reports, laboratory and other test data, was performed as part of comprehensive evaluation and provision of chronic care management services.   SDOH (Social Determinants of Health) assessments and interventions performed:    Care Plan  No Known Allergies  Outpatient Encounter Medications as of 05/02/2022  Medication Sig Note   aspirin 81 MG tablet Take 81 mg by mouth daily with lunch.     atorvastatin (LIPITOR) 40 MG tablet Take 40 mg by mouth daily with lunch.     Blood Pressure Monitoring (BLOOD PRESSURE CUFF) MISC 1 Product by Does not apply route as needed.    Calcium Carbonate-Vitamin D 600-400 MG-UNIT tablet Take 1 tablet by mouth 3 (three) times a week.     carbidopa-levodopa (SINEMET IR) 25-100 MG tablet Take 2 tablets by mouth 3 (three) times daily. 8am/noon/4pm.     Cholecalciferol (VITAMIN D PO) Take 5,000 Units by mouth 3 (three) times a week.     Cyanocobalamin (VITAMIN B12 PO) Take 5,000 mcg by mouth 2 (two) times a week.    glucose 4 GM chewable tablet Chew 1 tablet by mouth as needed for low blood sugar. 06/30/2020: As needed    glucose blood (ACCU-CHEK AVIVA PLUS) test strip Use to test blood sugar 4 times a day (Dx: E11.65)    HUMALOG KWIKPEN 100 UNIT/ML KiwkPen Inject 0-4 Units into the skin 3 (three) times daily with meals. Sliding scale per Dr Altheimer's office notes from 09/24/2019    hydrochlorothiazide (HYDRODIURIL) 25 MG tablet Take 25 mg by mouth daily with lunch.     insulin glargine (LANTUS) 100 UNIT/ML injection Inject 0.05 mLs (5 Units total) into the skin every evening. (Patient taking differently: Inject 5 Units into the skin daily.)    Insulin Syringe-Needle U-100 (INSULIN SYRINGE .3CC/31GX5/16") 31G X 5/16" 0.3 ML MISC Use as directed for Lantus injection once a day    levothyroxine (SYNTHROID, LEVOTHROID) 75 MCG tablet Take 75 mcg by mouth daily. 12/15/2021: Patient not taking 1 day a week (Sunday)    losartan (COZAAR) 50 MG tablet Take 1 tablet (50 mg total) by mouth daily.    NONFORMULARY OR COMPOUNDED ITEM [facility]:  Antifungal - Terbinafine 3%, Fluconazole 2%, Tea Tree Oil 5%, Urea 10%, Ibuprofen 2% in DMSO Suspension #98m. Apply to the affected nail(s) once (at bedtime) or twice daily.    Nutritional Supplements (FEEDING SUPPLEMENT, GLUCERNA 1.2 CAL,) LIQD Take 237 mLs by mouth daily as needed. 06/30/2020:  In place of meal when out at time   SUPERVALU INC Lancets 67H MISC Use to test blood sugar 4 times a day (Dx: E11.65)    Pioglitazone HCl (ACTOS PO) Take 45 mg by mouth daily with lunch.     Polyethyl Glycol-Propyl Glycol (SYSTANE) 0.4-0.3 % GEL ophthalmic gel Place 1 application into both eyes at bedtime as needed (FOR EYES).    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   Carotid artery disease ([place]) 03/21/2017   Hypertension associated with diabetes ([place]) 02/22/2017   Hyperlipidemia associated with type 2 diabetes mellitus ([place]) 02/22/2017   Hyperuricemia 10/31/2016   PD (Parkinson's disease) ([place]) 08/08/2016   Loss of weight 07/25/2016   DM (diabetes mellitus), type 2 with renal complications 41/93/7902   CKD (chronic kidney disease) stage 3, GFR 30-59 ml/min ([place]) 10/08/2012   Vitamin D deficiency 08/24/2012   Hypothyroid, s/p radioactive iodine tx for graves disease     Conditions to be addressed/monitored: CAD, DMII, and Parkinson's disease  Care Plan : RN Care Manager Plan of Care  Updates made by Dimitri Ped, RN since 05/02/2022 12:00 AM  Completed 05/02/2022   Problem: Chronic Disease Management and Care Coordination Needs (DM, Parkinsons Disease, CAD,CKD3, HTN ,HLD) Resolved 05/02/2022  Priority: High     Long-Range Goal: Establish Plan of Care for Chronic Disease Management Needs(DM, Parkinson's Disease, CAD,CKD3, HTN ,HLD) Completed 05/02/2022  Start Date: 11/15/2021  Expected End Date: 11/15/2022  Priority: High  Note:   RNCM Case closed goals met Current Barriers:  Knowledge Deficits related to plan of care for management of CAD, HTN, HLD, DMII, CKD Stage 3, and Parkinson's Disease  Care Coordination needs related to Limited social support, Transportation, and ADL IADL limitations Chronic Disease Management support and education needs related to CAD, HTN, HLD, DMII, CKD Stage 3, and Parkinson's Disease   Lacks caregiver support Transportation barriers  States that her Parkinson's disease makes her slower. States she completed PT and she has been trying to keep up with the exercises they taught her. States she has been doing better taking her pills on time and she has started  getting her pills in packs from [facility].  States she to taking her Lantus in the evening but she sometimes will take late if she falls asleep.  States she checks her CBG 3-4 times a day and doses her insulin depending on her blood sugars.  States her CBGs in the morning have ranged 130-140 and 140-200 later in the day. Denies any falls States she tries to eat healthy. States her B/P was normal when she last checked. Denies any chest pains or shortness of breath.  RNCM Clinical Goal(s):  Patient will verbalize understanding of plan for management of CAD, HTN, HLD, DMII, CKD Stage 3, and Parkinson's Disease  as evidenced by voiced adherence to plan of care verbalize basic understanding of  CAD, HTN, HLD, DMII, CKD Stage 3, and Parkinson's Disease  disease process and self health management plan as evidenced by voiced understanding and teach back take all medications exactly as prescribed and will call provider for medication related questions as evidenced by dispense report and pt verbalization attend all scheduled medical appointments: CCM LCSW 05/12/22, Endocrinology 06/03/22, Annual Wellness Visit 01/02/23 as evidenced by medical records demonstrate Improved adherence to  prescribed treatment plan for CAD, HTN, HLD, DMII, CKD Stage 3, and Parkinson's Disease  as evidenced by readings within limits, voiced adherence to plan of care continue to work with RN Care Manager to address care management and care coordination needs related to  CAD, HTN, HLD, DMII, CKD Stage 3, and Parkinson's Disease  as evidenced by adherence to CM Team Scheduled appointments work with pharmacist to address polypharmacy and adherence issues related toCAD, HTN, HLD, DMII, CKD Stage 3, and Parkinson's Disease  as evidenced by review or EMR and patient or pharmacist report work with Education officer, museum to address  related to the management of Limited social support, Transportation, Level of care concerns, and ADL IADL limitations related to the  management of CAD, HTN, HLD, DMII, CKD Stage 3, and Parkinson's Disease  as evidenced by review of EMR and patient or Education officer, museum report through collaboration with Consulting civil engineer, provider, and care team.   Interventions: 1:1 collaboration with primary care provider regarding development and update of comprehensive plan of care as evidenced by provider attestation and co-signature Inter-disciplinary care team collaboration (see longitudinal plan of care) Evaluation of current treatment plan related to  self management and patient's adherence to plan as established by provider   CAD Interventions: (Status:  Goal Met.) Long Term Goal Assessed understanding of CAD diagnosis Medications reviewed including medications utilized in CAD treatment plan Provided education on importance of blood pressure control in management of CAD Provided education on Importance of limiting foods high in cholesterol Counseled on importance of regular laboratory monitoring as prescribed Reviewed Importance of taking all medications as prescribed Reviewed Importance of attending all scheduled provider appointments Advised to report any changes in symptoms or exercise tolerance    Chronic Kidney Disease Interventions:  (Status:  Goal Met.) Long Term Goal Evaluation of current treatment plan related to chronic kidney disease self management and patient's adherence to plan as established by provider      Provided education to patient re: stroke prevention, s/s of heart attack and stroke    Reviewed medications with patient and discussed importance of compliance    Discussed complications of poorly controlled blood pressure such as heart disease, stroke, circulatory complications, vision complications, kidney impairment, sexual dysfunction    Discussed plans with patient for ongoing care management follow up and provided patient with direct contact information for care management team    Discussed the impact of chronic  kidney disease on daily life and mental health and acknowledged and normalized feelings of disempowerment, fear, and frustration    Provided education on kidney disease progression    Last practice recorded BP readings:  BP Readings from Last 3 Encounters:  02/21/22 (!) 138/58  11/01/21 136/78  08/13/21 118/60  Most recent eGFR/CrCl: No results found for: EGFR  No components found for: CRCL    Diabetes Interventions:  (Status:  Goal Met.) Long Term Goal Assessed patient's understanding of A1c goal: <7% Provided education to patient about basic DM disease process Reviewed medications with patient and discussed importance of medication adherence Counseled on importance of regular laboratory monitoring as prescribed Provided patient with written educational materials related to hypo and hyperglycemia and importance of correct treatment Advised patient, providing education and rationale, to check cbg 3-4 times daily and record, calling provider for findings outside established parameters Referral made to pharmacy team for assistance with polypharmacy, complicated medication regime and adherence issues Referral made to social work team for assistance with scheduled with CCM LCSW 01/20/22, Endocrinology 01/28/22  to address housing, level of care issues Review of patient status, including review of consultants reports, relevant laboratory and other test results, and medications completed Reviewed to have foot exam when she sees the endocrinologist. Reinforced importance of taking insulin and CBGs as ordered and to taking her Lantus every day. Reinforced to keep appointment with endocrinologist Lab Results  Component Value Date   HGBA1C 8.1 (H) 09/11/2018  Last Hemoglobin A1C at endocrinologist 9.4% 01/28/22  Parkinson's Disease   (Status:  Goal Met.)  Long Term Goal Evaluation of current treatment plan related to  Parkinson's Disease  ,  Parkinson's Disease   self-management and patient's adherence  to plan as established by provider. Discussed plans with patient for ongoing care management follow up and provided patient with direct contact information for care management team Evaluation of current treatment plan related to Parkinson's Disease  and patient's adherence to plan as established by provider Provided education to patient re: Parkinson's Disease  Social Work referral for scheduled with CCM LCSW 05/12/22 to follow up on housing, level of care issues Pharmacy referral for polypharmacy, complicated medication regime and adherence issues   now getting packaged medications  Reviewed to continue exercises that PT taught her. Reinforced instructions on home safety and fall precautions. Reinforced to contact her insurance company to check on benefit for personal emergency alarm   Hyperlipidemia Interventions:  (Status:  Goal Met.) Long Term Goal Medication review performed; medication list updated in electronic medical record.  Provider established cholesterol goals reviewed Counseled on importance of regular laboratory monitoring as prescribed Reviewed role and benefits of statin for ASCVD risk reduction Reviewed importance of limiting foods high in cholesterol  Hypertension Interventions:  (Status:  Goal Met.) Long Term Goal Last practice recorded BP readings:  BP Readings from Last 3 Encounters:  02/21/22 (!) 138/58  11/01/21 136/78  08/13/21 118/60  Most recent eGFR/CrCl: No results found for: EGFR  No components found for: CRCL  Evaluation of current treatment plan related to hypertension self management and patient's adherence to plan as established by provider Provided education to patient re: stroke prevention, s/s of heart attack and stroke Reviewed medications with patient and discussed importance of compliance Counseled on the importance of exercise goals with target of 150 minutes per week Discussed plans with patient for ongoing care management follow up and provided  patient with direct contact information for care management team Provided education on prescribed diet low sodium low CHO heart healthy Discussed complications of poorly controlled blood pressure such as heart disease, stroke, circulatory complications, vision complications, kidney impairment, sexual dysfunction Reinforced to try to take B/P at least weekly    Patient Goals/Self-Care Activities: Take all medications as prescribed Attend all scheduled provider appointments Call pharmacy for medication refills 3-7 days in advance of running out of medications Attend church or other social activities Call provider office for new concerns or questions  Work with the social worker to address care coordination needs and will continue to work with the clinical team to address health care and disease management related needs keep appointment with eye doctor check blood sugar at prescribed times: before meals and at bedtime and when you have symptoms of low or high blood sugar take the blood sugar log to all doctor visits drink 6 to 8 glasses of water each day fill half of plate with vegetables switch to sugar-free drinks Take Lantus insulin daily around the same time check blood pressure weekly choose a place to take my blood pressure (  home, clinic or office, retail store) take blood pressure log to all doctor appointments keep all doctor appointments begin an exercise program eat more whole grains, fruits and vegetables, lean meats and healthy fats limit salt intake to 2336m/day call for medicine refill 2 or 3 days before it runs out take all medications exactly as prescribed call doctor with any symptoms you believe are related to your medicine  Follow Up Plan:  The patient has been provided with contact information for the care management team and has been advised to call with any health related questions or concerns.  No further follow up required: RNCM Case closed goals met        Plan: The patient has been provided with contact information for the care management team and has been advised to call with any health related questions or concerns.  No further follow up required: RNCM Case closed goals met MPeter GarterRN, [facility] CDE Care Management Coordinator [facility]([phone]

## 2022-05-02 NOTE — Patient Instructions (Signed)
Visit Information RNCM Case closed goals met Thank you for allowing me to share the care management and care coordination services that are available to you as part of your health plan and services through your primary care provider and medical home. Please reach out to me at [phone] if the care management/care coordination team may be of assistance to you in the future.   Peter Garter RN, Jackquline Denmark, CDE Care Management Coordinator [facility] [phone]

## 2022-05-06 ENCOUNTER — Ambulatory Visit: Payer: Self-pay | Admitting: Licensed Clinical Social Worker

## 2022-05-06 ENCOUNTER — Encounter: Payer: Self-pay | Admitting: Licensed Clinical Social Worker

## 2022-05-06 NOTE — Patient Outreach (Signed)
  Care Coordination   Initial Visit Note   05/06/2022 Name: Pamela Patton MRN: [id number] DOB: [birth year]  Pamela Patton is a 76 y.o. year old female who sees Koberlein, Steele Berg, MD (Inactive) for primary care. I spoke with  Pamela Patton by phone today  What matters to the patients health and wellness today?  Stress managment    Goals Addressed             This Visit's Progress    COMPLETED: Manage My Emotions       Patient Goals/Self-Care Activities: Over the next 120 days Attend all scheduled medical appointments Utilize healthy coping skills and/or supportive resources discussed Contact PCP office with any questions or concerns     COMPLETED: Management of Stress   On track    Care Coordination Interventions: Mindfulness or Relaxation training provided Active listening / Reflection utilized  Emotional Support Provided Verbalization of feelings encouraged  Completed PT two weeks ago; however, she continues to complete daily exercises Pt identified healthy coping skills to continue with management of stress and anxiety symptoms         SDOH assessments and interventions completed:  No     Care Coordination Interventions Activated:  Yes  Care Coordination Interventions:  Yes, provided   Follow up plan: No further intervention required.   Encounter Outcome:  Pt. Visit Completed   Christa See, MSW, [email or web address] Phone [phone] 2:26 PM

## 2022-05-06 NOTE — Patient Instructions (Signed)
Visit Information  Thank you for taking time to visit with me today. Please don't hesitate to contact me if I can be of assistance to you.   Following are the goals we discussed today:   Goals Addressed             This Visit's Progress    COMPLETED: Manage My Emotions       Patient Goals/Self-Care Activities: Over the next 120 days Attend all scheduled medical appointments Utilize healthy coping skills and/or supportive resources discussed Contact PCP office with any questions or concerns     COMPLETED: Management of Stress   On track    Care Coordination Interventions: Mindfulness or Relaxation training provided Active listening / Reflection utilized  Emotional Support Provided Verbalization of feelings encouraged  Completed PT two weeks ago; however, she continues to complete daily exercises Pt identified healthy coping skills to continue with management of stress and anxiety symptoms         Please call the care guide team at [phone] if you need to cancel or reschedule your appointment.   If you are experiencing a Mental Health or [place] or need someone to talk to, please call the Suicide and Crisis Lifeline: 988 call 911   Patient verbalizes understanding of instructions and care plan provided today and agrees to view in [place]. Active MyChart status and patient understanding of how to access instructions and care plan via MyChart confirmed with patient.     No further follow up required: No additional interventions identified  Christa See, MSW, [email or web address] Phone [phone] 2:28 PM

## 2022-05-09 ENCOUNTER — Telehealth: Payer: Self-pay | Admitting: Pharmacist

## 2022-05-09 NOTE — Chronic Care Management (AMB) (Signed)
Chronic Care Management Pharmacy Assistant   Name: Pamela Patton  MRN: [id number] DOB: Aug 27, 1946  Reason for Encounter: Medication Review / Medication Coordination Call  Recent office visits:  None  Recent consult visits:  None  Hospital visits:  None  Medications: Outpatient Encounter Medications as of 05/09/2022  Medication Sig Note   aspirin 81 MG tablet Take 81 mg by mouth daily with lunch.     atorvastatin (LIPITOR) 40 MG tablet Take 40 mg by mouth daily with lunch.     Blood Pressure Monitoring (BLOOD PRESSURE CUFF) MISC 1 Product by Does not apply route as needed.    Calcium Carbonate-Vitamin D 600-400 MG-UNIT tablet Take 1 tablet by mouth 3 (three) times a week.     carbidopa-levodopa (SINEMET IR) 25-100 MG tablet Take 2 tablets by mouth 3 (three) times daily. 8am/noon/4pm.    Cholecalciferol (VITAMIN D PO) Take 5,000 Units by mouth 3 (three) times a week.     Cyanocobalamin (VITAMIN B12 PO) Take 5,000 mcg by mouth 2 (two) times a week.    glucose 4 GM chewable tablet Chew 1 tablet by mouth as needed for low blood sugar. 06/30/2020: As needed    glucose blood (ACCU-CHEK AVIVA PLUS) test strip Use to test blood sugar 4 times a day (Dx: E11.65)    HUMALOG KWIKPEN 100 UNIT/ML KiwkPen Inject 0-4 Units into the skin 3 (three) times daily with meals. Sliding scale per Dr Altheimer's office notes from 09/24/2019    hydrochlorothiazide (HYDRODIURIL) 25 MG tablet Take 25 mg by mouth daily with lunch.     insulin glargine (LANTUS) 100 UNIT/ML injection Inject 0.05 mLs (5 Units total) into the skin every evening. (Patient taking differently: Inject 5 Units into the skin daily.)    Insulin Syringe-Needle U-100 (INSULIN SYRINGE .3CC/31GX5/16") 31G X 5/16" 0.3 ML MISC Use as directed for Lantus injection once a day    levothyroxine (SYNTHROID, LEVOTHROID) 75 MCG tablet Take 75 mcg by mouth daily. 12/15/2021: Patient not taking 1 day a week (Sunday)    losartan (COZAAR) 50 MG tablet Take  1 tablet (50 mg total) by mouth daily.    NONFORMULARY OR COMPOUNDED ITEM [facility]:  Antifungal - Terbinafine 3%, Fluconazole 2%, Tea Tree Oil 5%, Urea 10%, Ibuprofen 2% in DMSO Suspension #67m. Apply to the affected nail(s) once (at bedtime) or twice daily.    Nutritional Supplements (FEEDING SUPPLEMENT, GLUCERNA 1.2 CAL,) LIQD Take 237 mLs by mouth daily as needed. 06/30/2020: In place of meal when out at time   OSUPERVALU INCLancets 340JMISC Use to test blood sugar 4 times a day (Dx: E11.65)    Pioglitazone HCl (ACTOS PO) Take 45 mg by mouth daily with lunch.     Polyethyl Glycol-Propyl Glycol (SYSTANE) 0.4-0.3 % GEL ophthalmic gel Place 1 application into both eyes at bedtime as needed (FOR EYES).    Facility-Administered Encounter Medications as of 05/09/2022  Medication   0.9 %  sodium chloride infusion  Reviewed chart for medication changes ahead of medication coordination call.  No OVs, Consults, or hospital visits since last care coordination call/Pharmacist visit. (If appropriate, list visit date, provider name)  No medication changes indicated OR if recent visit, treatment plan here.  BP Readings from Last 3 Encounters:  02/21/22 (!) 138/58  11/01/21 136/78  08/13/21 118/60    Lab Results  Component Value Date   HGBA1C 8.1 (H) 09/11/2018     Patient obtains medications through Adherence Packaging  30 Days  Last adherence delivery included:  Aspirin 81 mg 1 tablet at dinner Atorvastatin 40 mg 1 tablet at dinner Carbidopa Levodopa 25/100 mg 2 tablets at breakfast, lunch and bedtime Hydrochlorothiazide 25 mg 1 tablet at dinner Synthroid 75 mcg 1 tablet before breakfast  Losartan 50 mg 1 tablet at dinner Pioglitazone 45 mg 1 tablet at dinner   Patient declined last month:  Humalog Kwikpen 100 un/ml  Lantus 100 un/ml      Patient is due for next adherence delivery on: 05/19/2022,    Called patient and reviewed medications and coordinated delivery.    This delivery to include: Aspirin 81 mg 1 tablet at dinner Atorvastatin 40 mg 1 tablet at dinner Carbidopa Levodopa 25/100 mg 2 tablets at breakfast, lunch and bedtime Hydrochlorothiazide 25 mg 1 tablet at dinner Synthroid 75 mcg 1 tablet before breakfast  Losartan 50 mg 1 tablet at dinner Pioglitazone 45 mg 1 tablet at dinner   Patient will need a short fill: No short fill needed   Coordinated acute fill: No acute fill needed   Patient declined the following medications:  Humalog Kwikpen 100 un/ml  Lantus 100 un/ml  Patient states she has plenty and won't need this until her next delivery.   Confirmed delivery date of 05/19/2022, advised patient that pharmacy will contact them the morning of delivery.   Care Gaps: AWV - scheduled 01/02/2023 Last BP - 138/58 on 02/21/2022 Last A1C - 9.4 on 01/26/2022 Shingrix - never done [facility] - overdue Foot exam - overdue Colonoscopy - overdue Flu - due  Star Rating Drugs: Atorvastatin 40 mg - last filled 04/14/2022 30 [facility] at [facility] Losartan 50 mg - last filled 04/14/2022 30 [facility] at [facility] Actos 45 mg - last filled 04/14/2022 30 DS at [place] [phone]

## 2022-05-12 ENCOUNTER — Telehealth: Payer: Medicare PPO

## 2022-05-26 LAB — CBC AND DIFFERENTIAL: Hemoglobin: 9.5 — AB (ref 12.0–16.0)

## 2022-06-02 ENCOUNTER — Telehealth: Payer: Self-pay | Admitting: Pharmacist

## 2022-06-02 NOTE — Chronic Care Management (AMB) (Unsigned)
Chronic Care Management Pharmacy Assistant   Name: Pamela Patton  MRN: [id number] DOB: 11/02/1945  Reason for Encounter: Medication Review / Medication Coordination Call   Recent office visits:  None  Recent consult visits:  None  Hospital visits:  None  Medications: Outpatient Encounter Medications as of 06/02/2022  Medication Sig Note   aspirin 81 MG tablet Take 81 mg by mouth daily with lunch.     atorvastatin (LIPITOR) 40 MG tablet Take 40 mg by mouth daily with lunch.     Blood Pressure Monitoring (BLOOD PRESSURE CUFF) MISC 1 Product by Does not apply route as needed.    Calcium Carbonate-Vitamin D 600-400 MG-UNIT tablet Take 1 tablet by mouth 3 (three) times a week.     carbidopa-levodopa (SINEMET IR) 25-100 MG tablet Take 2 tablets by mouth 3 (three) times daily. 8am/noon/4pm.    Cholecalciferol (VITAMIN D PO) Take 5,000 Units by mouth 3 (three) times a week.     Cyanocobalamin (VITAMIN B12 PO) Take 5,000 mcg by mouth 2 (two) times a week.    glucose 4 GM chewable tablet Chew 1 tablet by mouth as needed for low blood sugar. 06/30/2020: As needed    glucose blood (ACCU-CHEK AVIVA PLUS) test strip Use to test blood sugar 4 times a day (Dx: E11.65)    HUMALOG KWIKPEN 100 UNIT/ML KiwkPen Inject 0-4 Units into the skin 3 (three) times daily with meals. Sliding scale per Dr Altheimer's office notes from 09/24/2019    hydrochlorothiazide (HYDRODIURIL) 25 MG tablet Take 25 mg by mouth daily with lunch.     insulin glargine (LANTUS) 100 UNIT/ML injection Inject 0.05 mLs (5 Units total) into the skin every evening. (Patient taking differently: Inject 5 Units into the skin daily.)    Insulin Syringe-Needle U-100 (INSULIN SYRINGE .3CC/31GX5/16") 31G X 5/16" 0.3 ML MISC Use as directed for Lantus injection once a day    levothyroxine (SYNTHROID, LEVOTHROID) 75 MCG tablet Take 75 mcg by mouth daily. 12/15/2021: Patient not taking 1 day a week (Sunday)    losartan (COZAAR) 50 MG tablet  Take 1 tablet (50 mg total) by mouth daily.    NONFORMULARY OR COMPOUNDED ITEM [facility]:  Antifungal - Terbinafine 3%, Fluconazole 2%, Tea Tree Oil 5%, Urea 10%, Ibuprofen 2% in DMSO Suspension #22m. Apply to the affected nail(s) once (at bedtime) or twice daily.    Nutritional Supplements (FEEDING SUPPLEMENT, GLUCERNA 1.2 CAL,) LIQD Take 237 mLs by mouth daily as needed. 06/30/2020: In place of meal when out at time   OSUPERVALU INCLancets 310XMISC Use to test blood sugar 4 times a day (Dx: E11.65)    Pioglitazone HCl (ACTOS PO) Take 45 mg by mouth daily with lunch.     Polyethyl Glycol-Propyl Glycol (SYSTANE) 0.4-0.3 % GEL ophthalmic gel Place 1 application into both eyes at bedtime as needed (FOR EYES).    Facility-Administered Encounter Medications as of 06/02/2022  Medication   0.9 %  sodium chloride infusion   Reviewed chart for medication changes ahead of medication coordination call.  BP Readings from Last 3 Encounters:  02/21/22 (!) 138/58  11/01/21 136/78  08/13/21 118/60    Lab Results  Component Value Date   HGBA1C 8.1 (H) 09/11/2018     Patient obtains medications through Adherence Packaging  30 Days    Last adherence delivery included:  Aspirin 81 mg 1 tablet at dinner Atorvastatin 40 mg 1 tablet at dinner Carbidopa Levodopa 25/100 mg 2 tablets at breakfast, lunch  and bedtime Hydrochlorothiazide 25 mg 1 tablet at dinner Synthroid 75 mcg 1 tablet before breakfast  Losartan 50 mg 1 tablet at dinner Pioglitazone 45 mg 1 tablet at dinner   Patient declined last month:  Humalog Kwikpen 100 un/ml  Lantus 100 un/ml      Patient is due for next adherence delivery on: 06/15/2022,    Called patient and reviewed medications and coordinated delivery.   This delivery to include: Aspirin 81 mg 1 tablet at dinner Atorvastatin 40 mg 1 tablet at dinner Carbidopa Levodopa 25/100 mg 2 tablets at breakfast, lunch and bedtime Hydrochlorothiazide 25 mg 1 tablet at  dinner Synthroid 75 mcg 1 tablet before breakfast  Losartan 50 mg 1 tablet at dinner Pioglitazone 45 mg 1 tablet at dinner Lantus 100 un/ml inject 5 units daily Humalog Kwikpen 100 un/ml inject 0-4 Units into the skin 3 times daily with meals. Sliding scale Accuchek guide strips use 4 times daily Accuchek softclix lancets use 4 times daily  Patient will need a short fill: No short fill needed   Coordinated acute fill: No acute fill needed   Patient declined the following medications: no medications declined   Confirmed delivery date of 06/15/2022, advised patient that pharmacy will contact them the morning of delivery.  Care Gaps: AWV - scheduled 01/02/2023 Last BP - 138/58 on 02/21/2022 Last A1C - 9.4 on 01/26/2022 Shingrix - never done [facility] - overdue Foot exam - overdue Colonoscopy - overdue Flu - due  Star Rating Drugs: Atorvastatin 40 mg - last filled 05/16/2022 30 [facility] at [facility] Losartan 50 mg - last filled 05/16/2022 30 DS at [facility] Actos 45 mg - last filled 05/16/2022 30 DS at [place] [phone]

## 2022-06-24 ENCOUNTER — Ambulatory Visit
Admission: RE | Admit: 2022-06-24 | Discharge: 2022-06-24 | Disposition: A | Discharge status: Home / Self Care | Payer: Medicare PPO | Source: Ambulatory Visit | Attending: Urology | Admitting: Urology

## 2022-06-24 DIAGNOSIS — D3000 Benign neoplasm of unspecified kidney: Secondary | ICD-10-CM

## 2022-06-24 MED ORDER — GADOBENATE DIMEGLUMINE 529 MG/ML IV SOLN
18.0000 mL | Freq: Once | INTRAVENOUS | Status: AC | PRN
  Administered 2022-06-24: 18 mL via INTRAVENOUS

## 2022-07-04 ENCOUNTER — Telehealth: Payer: Self-pay | Admitting: Pharmacist

## 2022-07-04 NOTE — Chronic Care Management (AMB) (Cosign Needed)
Chronic Care Management Pharmacy Assistant   Name: Pamela Patton  MRN: [id number] DOB: 1946/02/04  Reason for Encounter: Medication Review / Medication Coordination Call   Recent office visits:  None  Recent consult visits:  06/22/2022 Christeen Douglas RD (diabetes ed) - Patient was seen for Type 2 diabetes mellitus with hyperglycemia, with long-term current use of insulin. Follow up Christeen Douglas, RD LDN: TBD for CGM training Dr. Grandville Silos.   06/17/2022 Daine Floras MD (endocrinology) Patient was seen for Type 2 diabetes mellitus with hyperglycemia, with long-term current use of insulin. No additional chart notes.   06/03/2022 Daine Floras MD (endocrinology) Patient was seen for Type 2 diabetes mellitus with hyperglycemia, with long-term current use of insulin. Referral to Diabetes Education. Follow up in 2 weeks if possible to coincide with nurse visit for proCGM removal.   Hospital visits:  None  Medications: Outpatient Encounter Medications as of 07/04/2022  Medication Sig Note   aspirin 81 MG tablet Take 81 mg by mouth daily with lunch.     atorvastatin (LIPITOR) 40 MG tablet Take 40 mg by mouth daily with lunch.     Blood Pressure Monitoring (BLOOD PRESSURE CUFF) MISC 1 Product by Does not apply route as needed.    Calcium Carbonate-Vitamin D 600-400 MG-UNIT tablet Take 1 tablet by mouth 3 (three) times a week.     carbidopa-levodopa (SINEMET IR) 25-100 MG tablet Take 2 tablets by mouth 3 (three) times daily. 8am/noon/4pm.    Cholecalciferol (VITAMIN D PO) Take 5,000 Units by mouth 3 (three) times a week.     Cyanocobalamin (VITAMIN B12 PO) Take 5,000 mcg by mouth 2 (two) times a week.    glucose 4 GM chewable tablet Chew 1 tablet by mouth as needed for low blood sugar. 06/30/2020: As needed    glucose blood (ACCU-CHEK AVIVA PLUS) test strip Use to test blood sugar 4 times a day (Dx: E11.65)    HUMALOG KWIKPEN 100 UNIT/ML KiwkPen Inject 0-4 Units into the skin 3 (three) times  daily with meals. Sliding scale per Dr Altheimer's office notes from 09/24/2019    hydrochlorothiazide (HYDRODIURIL) 25 MG tablet Take 25 mg by mouth daily with lunch.     insulin glargine (LANTUS) 100 UNIT/ML injection Inject 0.05 mLs (5 Units total) into the skin every evening. (Patient taking differently: Inject 5 Units into the skin daily.)    Insulin Syringe-Needle U-100 (INSULIN SYRINGE .3CC/31GX5/16") 31G X 5/16" 0.3 ML MISC Use as directed for Lantus injection once a day    levothyroxine (SYNTHROID, LEVOTHROID) 75 MCG tablet Take 75 mcg by mouth daily. 12/15/2021: Patient not taking 1 day a week (Sunday)    losartan (COZAAR) 50 MG tablet Take 1 tablet (50 mg total) by mouth daily.    NONFORMULARY OR COMPOUNDED ITEM [facility]:  Antifungal - Terbinafine 3%, Fluconazole 2%, Tea Tree Oil 5%, Urea 10%, Ibuprofen 2% in DMSO Suspension #87m. Apply to the affected nail(s) once (at bedtime) or twice daily.    Nutritional Supplements (FEEDING SUPPLEMENT, GLUCERNA 1.2 CAL,) LIQD Take 237 mLs by mouth daily as needed. 06/30/2020: In place of meal when out at time   OSUPERVALU INCLancets 328NMISC Use to test blood sugar 4 times a day (Dx: E11.65)    Pioglitazone HCl (ACTOS PO) Take 45 mg by mouth daily with lunch.     Polyethyl Glycol-Propyl Glycol (SYSTANE) 0.4-0.3 % GEL ophthalmic gel Place 1 application into both eyes at bedtime as needed (FOR EYES).  Facility-Administered Encounter Medications as of 07/04/2022  Medication   0.9 %  sodium chloride infusion   Reviewed chart for medication changes ahead of medication coordination call.  BP Readings from Last 3 Encounters:  02/21/22 (!) 138/58  11/01/21 136/78  08/13/21 118/60    Lab Results  Component Value Date   HGBA1C 8.1 (H) 09/11/2018     Patient obtains medications through Adherence Packaging  30 Days    Last adherence delivery included:  Aspirin 81 mg 1 tablet at dinner Atorvastatin 40 mg 1 tablet at dinner Carbidopa  Levodopa 25/100 mg 2 tablets at breakfast, lunch and bedtime Hydrochlorothiazide 25 mg 1 tablet at dinner Synthroid 75 mcg 1 tablet before breakfast  Losartan 50 mg 1 tablet at dinner Pioglitazone 45 mg 1 tablet at dinner Lantus 100 un/ml inject 5 units daily Humalog Kwikpen 100 un/ml inject 0-4 Units into the skin 3 times daily with meals. Sliding scale Accuchek guide strips use 4 times daily Accuchek softclix lancets use 4 times daily   Patient declined last month:  no medications declined     Patient is due for next adherence delivery on: 07/14/2022,    Called patient and reviewed medications and coordinated delivery.   This delivery to include: Aspirin 81 mg 1 tablet at dinner Atorvastatin 40 mg 1 tablet at dinner Carbidopa Levodopa 25/100 mg 2 tablets at breakfast, lunch and bedtime Hydrochlorothiazide 25 mg 1 tablet at dinner Synthroid 75 mcg 1 tablet before breakfast daily except Sunday Losartan 50 mg 1 tablet at dinner Pioglitazone 45 mg 1 tablet at dinner Novolog flexpen 100 un/ml: inject 3 units with meals plus 1-6 un per sliding scale. (Sent to [facility] by Dr. Daine Floras)   Patient will need a short fill: No short fill needed   Coordinated acute fill: No acute fill needed   Patient declined the following medications:  Lantus 100 un/ml inject 5 units daily (due 10/31/22) Accuchek guide strips use 4 times daily (due 09/08/22) Accuchek softclix lancets use 4 times daily (due 09/08/22)  Medication discontinued: Humalog Kwikpen discontinued (patient is aware to discontinue this medication)   Confirmed delivery date of 07/14/2022, advised patient that pharmacy will contact them the morning of delivery.   Care Gaps: AWV - scheduled 01/02/2023  Last BP - 138/58 on 02/21/2022 Last A1C - 9.5 on 05/26/2022 Shingrix - never done [facility] - overdue Foot exam - overdue Colonoscopy - overdue GFR - overdue Covid - overdue Flu - due  Star Rating Drugs: Atorvastatin 40 mg -  last filled 06/10/2022 30 DS at [facility] Losartan 50 mg - last filled 06/10/2022 30 DS at [facility] Actos 45 mg - last filled 06/10/2022 30 DS at [place] [phone]

## 2022-07-19 ENCOUNTER — Telehealth: Payer: Self-pay | Admitting: Pharmacist

## 2022-07-19 NOTE — Chronic Care Management (AMB) (Signed)
    Chronic Care Management Pharmacy Assistant   Name: TANIS HENSARLING  MRN: [id number] DOB: [birth year]  07/20/2022 APPOINTMENT REMINDER  Called [name], No answer, left message of appointment on 07/20/2022 at 4:00 via telephone visit with Jeni Salles, Pharm D. Notified to have all medications, supplements, blood pressure and/or blood sugar logs available during appointment and to return call if need to reschedule.  Care Gaps: AWV - schedule 01/02/2023 Last BP - 138/58 on 02/21/2022 Last A1C - 9.5 on 05/26/2022 Shingrix - never done [facility] - overdue Foot exam - overdue Colonoscopy - overdue GFR - overdue Covid - overdue Flu - due  Star Rating Drug: Atorvastatin 40 mg - last filled 07/12/2022 30 DS at [facility] Losartan 50 mg - last filled 07/12/2022 30 DS at [facility] Actos 45 mg - last filled 07/12/2022 30 [facility]  Any gaps in medications fill history? No  Gennie Alma [facility]  Catering manager [phone]

## 2022-07-20 ENCOUNTER — Ambulatory Visit: Payer: Medicare PPO | Admitting: Pharmacist

## 2022-07-20 DIAGNOSIS — Z794 Long term (current) use of insulin: Secondary | ICD-10-CM

## 2022-07-20 DIAGNOSIS — M810 Age-related osteoporosis without current pathological fracture: Secondary | ICD-10-CM

## 2022-07-20 NOTE — Progress Notes (Signed)
Chronic Care Management Pharmacy Note  07/22/2022 Name:  Pamela Patton MRN:  [id number] DOB:  1946/06/11  Summary: A1c not at goal < 8% Pt is having difficulty with injection timing and blood sugar testing  Recommendations/Changes made from today's visit: -Provided diabetesfoodhub website for recipe ideas -Recommend scheduling repeat DEXA -Reached out to endocrinology about resending CGM prescription to the correct DME -Recommended trial of Mucinex for congestion  Plan: Scheduled TOC visit with new PCP Follow up after discussion with endo  Subjective: Pamela Patton is an 76 y.o. year old female who is a primary patient of Koberlein, Steele Berg, MD (Inactive).  The CCM team was consulted for assistance with disease management and care coordination needs.    Engaged with patient by telephone for follow up visit in response to provider referral for pharmacy case management and/or care coordination services.   Consent to Services:  The patient was given information about Chronic Care Management services, agreed to services, and gave verbal consent prior to initiation of services.  Please see initial visit note for detailed documentation.   Patient Care Team: Caren Macadam, MD (Inactive) as PCP - General (Family Medicine) Lorretta Harp, MD as PCP - Cardiology (Cardiology) Ardis Hughs, MD as Attending Physician (Urology) Armbruster, Carlota Raspberry, MD as Consulting Physician (Gastroenterology) Altheimer, Legrand Como, MD as Referring Physician (Endocrinology) [name], Eustace Quail, DO as Consulting Physician (Neurology) Viona Gilmore, [facility] as Pharmacist (Pharmacist)  Recent office visits: 12/29/21 [name], LPN: Patient presented for AWV.  11/01/2021 Micheline Rough MD - Patient was seen for preventative health care and additional issues. Changed Lantus to 5 units every evening. Discontinued Mirtazapine. No follow up noted.   Recent consult visits: 06/22/2022 Christeen Douglas RD (diabetes ed) - Patient was seen for Type 2 diabetes mellitus with hyperglycemia, with long-term current use of insulin. Follow up Christeen Douglas, RD LDN: TBD for CGM training Dr. Grandville Silos.    06/17/2022 Daine Floras MD (endocrinology) Patient was seen for Type 2 diabetes mellitus with hyperglycemia, with long-term current use of insulin. No additional chart notes.    06/03/2022 Daine Floras MD (endocrinology) Patient was seen for Type 2 diabetes mellitus with hyperglycemia, with long-term current use of insulin. Referral to Diabetes Education. Follow up in 2 weeks if possible to coincide with nurse visit for proCGM removal.   04/20/22 Mickie Bail Plaster, PT (outpatient rehab): Patient presented for PT treatment.  03/21/22 Elease Etienne, PT (outpatient rehab): Patient presented for PT treatment for gait and mobility.  03/07/2022 [name] PT - Patient was seen for Other abnormalities of gait and mobility and unsteadiness on feet. Follow up is 2 times per week for 8 weeks starting 03/03/2022.   02/21/2022 [name] DO (neurology) - Patient was seen for parkinson's disease. Referral to Physical Therapy.  No medication changes. No follow up noted.    01/28/2022 Daine Floras MD (endocrinology) - Patient was seen for Diabetes mellitus type 2 with hyperglycemia, with long-term current use of insulin and additional issues. Increase Humalog to 3 units 10 minutes before each meal plus correction scale: Glucose Units Below 100 = 0 units  101 - 120 = 1 unit  121 - 150 = 2 units  151 - 200 = 3 units  201 - 250 = 4 units  251 - 300 = 5 units  over 300 = 6 units Follow up in 3 months  Hospital visits: None in previous 6 months   Objective:  Lab Results  Component Value  Date   CREATININE 1.53 (H) 08/14/2020   BUN 31 (H) 08/14/2020   GFR 49.31 (L) 09/11/2018   GFRNONAA 35 (L) 08/14/2020   GFRAA 43 (L) 11/28/2017   NA 142 08/14/2020   K 4.3 08/14/2020   CALCIUM 9.4 08/14/2020    CO2 23 08/14/2020   GLUCOSE 299 (H) 08/14/2020    Lab Results  Component Value Date/Time   HGBA1C 8.1 (H) 09/11/2018 02:19 PM   HGBA1C 7.3 05/22/2018 12:00 AM   HGBA1C 7.4 02/08/2018 12:00 AM   GFR 49.31 (L) 09/11/2018 02:19 PM   GFR 55.52 (L) 01/16/2018 12:05 PM   MICROALBUR 1.1 06/17/2014 09:52 AM    Last diabetic Eye exam:  Lab Results  Component Value Date/Time   HMDIABEYEEXA Retinopathy (A) 03/24/2022 12:00 AM    Last diabetic Foot exam:  Lab Results  Component Value Date/Time   HMDIABFOOTEX good 06/17/2014 12:00 AM     Lab Results  Component Value Date   CHOL 152 08/11/2020   HDL 72 08/11/2020   LDLCALC 70 08/11/2020   TRIG 47 08/11/2020   CHOLHDL 2.1 08/11/2020       Latest Ref Rng & Units 08/11/2020   11:16 AM 04/27/2017   12:00 AM 06/03/2016    2:44 PM  Hepatic Function  Total Protein 6.0 - 8.5 g/dL 6.9   7.8   Albumin 3.7 - 4.7 g/dL 4.5   4.7   AST 0 - 40 IU/L _0 ALT 0 - 32 IU/L _1 Alk Phosphatase 44 - 121 IU/L 73  55     58   Total Bilirubin 0.0 - 1.2 mg/dL 0.5   0.6   Bilirubin, Direct 0.00 - 0.40 mg/dL 0.16        This result is from an external source.    Lab Results  Component Value Date/Time   TSH 0.30 (A) 04/27/2017 12:00 AM   TSH 0.58 04/02/2015 10:02 AM   TSH 0.44 06/17/2014 09:52 AM   FREET4 1.21 04/19/2010 03:09 AM       Latest Ref Rng & Units 08/14/2020    9:44 PM 09/11/2018    2:19 PM 05/08/2018   11:04 AM  CBC  WBC 4.0 - 10.5 K/uL 9.5  4.6  6.1   Hemoglobin 12.0 - 15.0 g/dL 9.9  10.3  11.1   Hematocrit 36.0 - 46.0 % 32.2  31.3  33.9   Platelets 150 - 400 K/uL 272  204.0  240.0     No results found for: "VD25OH"  Clinical ASCVD: Yes  The 10-year ASCVD risk score (Arnett DK, et al., 2019) is: 32.8%   Values used to calculate the score:     Age: 46 years     Sex: Female     Is Non-Hispanic African American: Yes     Diabetic: Yes     Tobacco smoker: No     Systolic Blood Pressure: 401 mmHg      Is BP treated: Yes     HDL Cholesterol: 71 MG/DL     Total Cholesterol: 163 MG/DL       12/29/2021    1:31 PM 11/15/2021   10:17 AM 11/01/2021   11:13 AM  Depression screen PHQ 2/9  Decreased Interest 0 1 1  Down, Depressed, Hopeless 0 1 1  PHQ - 2 Score 0 2 2  Altered sleeping 0 1 1  Tired, decreased energy 0  1 2  Change in appetite 0 1 1  Feeling bad or failure about yourself  0 0 0  Trouble concentrating 0 1 1  Moving slowly or fidgety/restless 0 1 1  Suicidal thoughts 0 0 0  PHQ-9 Score 0 7 8      Social History   Tobacco Use  Smoking Status Never  Smokeless Tobacco Never   BP Readings from Last 3 Encounters:  02/21/22 (!) 138/58  11/01/21 136/78  08/13/21 118/60   Pulse Readings from Last 3 Encounters:  02/21/22 72  11/01/21 86  08/13/21 64   Wt Readings from Last 3 Encounters:  02/21/22 131 lb (59.4 kg)  12/29/21 131 lb (59.4 kg)  11/01/21 131 lb 11.2 oz (59.7 kg)   BMI Readings from Last 3 Encounters:  02/21/22 21.14 kg/m  12/29/21 21.14 kg/m  11/01/21 21.26 kg/m    Assessment/Interventions: Review of patient past medical history, allergies, medications, health status, including review of consultants reports, laboratory and other test data, was performed as part of comprehensive evaluation and provision of chronic care management services.   SDOH:  (Social Determinants of Health) assessments and interventions performed: Yes (last 12/15/21) SDOH Interventions    Flowsheet Row Clinical Support from 12/29/2021 in [place] [facility] [place] [facility] from 12/15/2021 in [place] at [place] [facility] from 11/15/2021 in [place] at [place] Interventions Intervention Not Indicated -- Intervention Not Indicated  Housing Interventions Intervention Not Indicated -- Intervention Not Indicated  Transportation Interventions Intervention Not Indicated Intervention Not  Indicated Other (Comment)  [insurance benefit for transportation]  Financial Strain Interventions Intervention Not Indicated Intervention Not Indicated Intervention Not Indicated  Physical Activity Interventions Intervention Not Indicated -- Other (Comments)  [reviewed [employer]ne[employer] benefit]  Stress Interventions Intervention Not Indicated -- --  Social Connections Interventions Intervention Not Indicated -- --       SDOH Screenings   Food Insecurity: No Food Insecurity (12/29/2021)  Housing: [place]  (12/29/2021)  Transportation Needs: No Transportation Needs (12/29/2021)  Alcohol Screen: Low Risk  (12/29/2021)  Depression (PHQ2-9): Low Risk  (12/29/2021)  Recent Concern: Depression (PHQ2-9) - Medium Risk (11/15/2021)  Financial Resource Strain: Low Risk  (12/29/2021)  Physical Activity: Insufficiently Active (12/29/2021)  Social Connections: Moderately Integrated (12/29/2021)  Stress: No Stress Concern Present (12/29/2021)  Tobacco Use: Low Risk  (05/06/2022)    CCM Care Plan  No Known Allergies  Medications Reviewed Today     Reviewed by Viona Gilmore, [facility] (Pharmacist) on 07/20/22 at 1628  Med List Status: <None>   Medication Order Taking? Sig Documenting Provider Last Dose Status Informant  0.9 %  sodium chloride infusion [id number]   Armbruster, Carlota Raspberry, MD  Active   aspirin 81 MG tablet [id number]  Take 81 mg by mouth daily with lunch.  [provider]  Active Self  atorvastatin (LIPITOR) 40 MG tablet [id number]  Take 40 mg by mouth daily with lunch.  [provider]  Active Self  Blood Pressure Monitoring (BLOOD PRESSURE CUFF) MISC [id number]  1 Product by Does not apply route as needed. Lucretia Kern, DO  Active   Calcium Carbonate-Vitamin D 600-400 MG-UNIT tablet [id number]  Take 1 tablet by mouth 3 (three) times a week.  [provider]  Active Self  carbidopa-levodopa (SINEMET IR) 25-100 MG tablet [id number]  Take 2 tablets by mouth 3 (three) times  daily. 8am/noon/4pm. Ludwig Clarks, DO  Active  Cholecalciferol (VITAMIN D PO) [id number]  Take 5,000 Units by mouth 3 (three) times a week.  [provider]  Active Self  Cyanocobalamin (VITAMIN B12 PO) [id number]  Take 5,000 mcg by mouth 2 (two) times a week. [provider]  Active   glucose 4 GM chewable tablet [id number]  Chew 1 tablet by mouth as needed for low blood sugar. [provider]  Active Self           Med Note Willette Brace   Tue Jun 30, 2020 11:16 AM) As needed   glucose blood (ACCU-CHEK AVIVA PLUS) test strip [id number]  Use to test blood sugar 4 times a day (Dx: E11.65) [provider]  Active   HUMALOG KWIKPEN 100 UNIT/ML Mayer Masker [id number]  Inject 0-4 Units into the skin 3 (three) times daily with meals. Sliding scale per Dr Altheimer's office notes from 09/24/2019 [provider]  Active Self           Med Note (DAVIS, SOPHIA A   Thu Mar 22, 2018 10:05 AM)    hydrochlorothiazide (HYDRODIURIL) 25 MG tablet [id number]  Take 25 mg by mouth daily with lunch.  [provider]  Active Self  insulin glargine (LANTUS) 100 UNIT/ML injection [id number]  Inject 0.05 mLs (5 Units total) into the skin every evening.  Patient taking differently: Inject 5 Units into the skin daily.   Caren Macadam, MD  Active   Insulin Syringe-Needle U-100 (INSULIN SYRINGE .3CC/31GX5/16") 31G X 5/16" 0.3 ML MISC [id number]  Use as directed for Lantus injection once a day [provider]  Active   levothyroxine (SYNTHROID, LEVOTHROID) 75 MCG tablet [id number]  Take 75 mcg by mouth daily. [provider]  Active Self           Med Note Kipp Brood, MADELINE G   Wed Dec 15, 2021 10:11 AM) Patient not taking 1 day a week (Sunday)   losartan (COZAAR) 50 MG tablet [id number]  Take 1 tablet (50 mg total) by mouth daily. Caren Macadam, MD  Active   NONFORMULARY OR COMPOUNDED ITEM [id number]   Apothecary:  Antifungal - Terbinafine 3%,  Fluconazole 2%, Tea Tree Oil 5%, Urea 10%, Ibuprofen 2% in DMSO Suspension #87m. Apply to the affected nail(s) once (at bedtime) or twice daily. GMarzetta Board [name]  Active   Nutritional Supplements (FEEDING SUPPLEMENT, GLUCERNA 1.2 CAL,) LIQD [id number] Take 237 mLs by mouth daily as needed. [provider]  Active Self           Med Note (Willette Brace  Tue Jun 30, 2020 11:19 AM) In place of meal when out at time  OMer[facility] HospitalLancets 307MMISC [id number] Use to test blood sugar 4 times a day (Dx: E11.65) [provider]  Active   Pioglitazone HCl (ACTOS PO) [id number] Take 45 mg by mouth daily with lunch.  [provider]  Active Self  Polyethyl Glycol-Propyl Glycol (SYSTANE) 0.4-0.3 % GEL ophthalmic gel [id number] Place 1 application into both eyes at bedtime as needed (FOR EYES). 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   Carotid artery disease ([place] 03/21/2017   Hypertension associated with diabetes ([place] 02/22/2017   Hyperlipidemia associated with type 2 diabetes mellitus ([place] 02/22/2017   Hyperuricemia 10/31/2016   PD (  Parkinson's disease) 08/08/2016   Loss of weight 07/25/2016   DM (diabetes mellitus), type 2 with renal complications 67/20/9470   CKD (chronic kidney disease) stage 3, GFR 30-59 ml/min (HCC) 10/08/2012   Vitamin D deficiency 08/24/2012   Hypothyroid, s/p radioactive iodine tx for graves disease     Immunization History  Administered Date(s) Administered   Influenza Whole 07/03/2012   Influenza, High Dose Seasonal PF 08/01/2017, 08/23/2018, 06/21/2019   Influenza-Unspecified 07/04/2015, 07/04/2016, 07/03/2017, 08/21/2018, 06/21/2019, 07/03/2021   PFIZER(Purple Top)SARS-COV-2 Vaccination 10/23/2019, 11/11/2019, 08/02/2020   PNEUMOCOCCAL CONJUGATE-20 11/01/2021   Pfizer Covid-19  Vaccine Bivalent Booster 90yr & up 09/20/2021   Pneumococcal Conjugate-13 06/17/2014   Pneumococcal Polysaccharide-23 10/03/2009, 02/01/2011   Tdap 03/03/2009, 03/12/2016   Zoster, Live 01/31/2010   Patient is sleeping a lot better lately.   Patient hasn't gotten it yet. She has still been checking with the regular finger sticks. She reports some days she has been slow with getting down a routine with this. Patient reports her morning routine takes a few hours so that makes it harder to do.   Patient does not eat full meals and is really late getting in a last meal of the day. She reports that she is not very creative with recipes and she used to cook for others and invite folks in more often.   Patient did try the CGM for a few weeks and did enjoy it. She didn't get much feedback but knows it will get rid of some of the time that it takes her to check blood sugars.  Conditions to be addressed/monitored:  Hypertension, Hyperlipidemia, Diabetes, Chronic Kidney Disease, Hypothyroidism, and Parkinson's disease  Conditions addressed this visit: Hypertension, diabetes  Care Plan : CCM Pharmacy Care Plan  Updates made by PViona Gilmore RZortmansince 07/22/2022 12:00 AM     Problem: Problem: Hypertension, Hyperlipidemia, Diabetes, Chronic Kidney Disease, Hypothyroidism, and Parkinson's disease      Long-Range Goal: Patient-Specific Goal   Start Date: 12/15/2021  Expected End Date: 12/16/2022  Recent Progress: On track  Priority: High  Note:   Current Barriers:  Unable to independently monitor therapeutic efficacy Unable to achieve control of diabetes   Pharmacist Clinical Goal(s):  Patient will achieve adherence to monitoring guidelines and medication adherence to achieve therapeutic efficacy achieve control of diabetes as evidenced by A1c  through collaboration with PharmD and provider.   Interventions: 1:1 collaboration with KCaren Macadam MD regarding development and update  of comprehensive plan of care as evidenced by provider attestation and co-signature Inter-disciplinary care team collaboration (see longitudinal plan of care) Comprehensive medication review performed; medication list updated in electronic medical record  Hypertension (BP goal <130/80) -Not ideally controlled -Current treatment: Hydrochlorothiazide 25 mg 1 tablet daily - Appropriate, Effective, Safe, Accessible Losartan 50 mg 1 tablet daily - Appropriate, Effective, Safe, Accessible -Medications previously tried: unknown  -Current home readings: has an arm cuff -Current dietary habits: cooks with salt a little bit; eats instant oatmeal for breakfast; recommended low sodium salt; mostly cooks at home -Current exercise habits: using the bike pedal daily -Denies hypotensive/hypertensive symptoms -Educated on BP goals and benefits of medications for prevention of heart attack, stroke and kidney damage; Exercise goal of 150 minutes per week; Importance of home blood pressure monitoring; Proper BP monitoring technique; -Counseled to monitor BP at home weekly, document, and provide log at future appointments -Counseled on diet and exercise extensively Recommended to continue current medication  Hyperlipidemia: (LDL goal < 70) -Controlled (10/28/21 LDL=61) -  Current treatment: Atorvastatin 40 mg 1 tablet daily - Appropriate, Effective, Safe, Accessible -Medications previously tried: none  -Current dietary patterns: not eating out much -Current exercise habits: using the foot pedal almost every day -Educated on Cholesterol goals;  Benefits of statin for ASCVD risk reduction; Importance of limiting foods high in cholesterol; -Counseled on diet and exercise extensively Recommended to continue current medication  Diabetes (A1c goal <8%) -Uncontrolled -Current medications: Novolog 100 units/mL inject based on a sliding scale - Appropriate, Query effective, Safe, Accessible Lantus 100 units/mL  inject 5 units daily - Appropriate, Query effective, Safe, Accessible Pioglitazone 45 mg 1 tablet daily - Appropriate, Query effective, Safe, Accessible -Medications previously tried: metformin (GI side effects, muscle spasms)  -Current home glucose readings: usually 2-3 times a day fasting glucose: highest was 213, 140, 151, 97, 161, 147, 142, 154  post prandial glucose: n/a -Denies hypoglycemic/hyperglycemic symptoms -Current meal patterns:  breakfast: did not discuss  lunch: did not discuss  dinner: did not discuss snacks: did not discuss drinks: did not discuss -Current exercise: not using the foot pedal as often -Educated on A1c and blood sugar goals; Exercise goal of 150 minutes per week; Proper insulin injection technique; Benefits of routine self-monitoring of blood sugar; Continuous glucose monitoring; Carbohydrate counting and/or plate method -Counseled to check feet daily and get yearly eye exams -Counseled on diet and exercise extensively Collaborated with endocrinology about resending CGM prescription to DME.  Osteoporosis (Goal prevent fractures) -Uncontrolled -Last DEXA Scan: 07/2017   T-Score femoral neck: -2.6  T-Score total hip: n/a  T-Score lumbar spine: -2.0  T-Score forearm radius: n/a  10-year probability of major osteoporotic fracture: n/a  10-year probability of hip fracture: n/a -Patient is a candidate for pharmacologic treatment due to T-Score < -2.5 in femoral neck -Current treatment  Calcium carbonate 600-400 units 1 tablet three times a week - Appropriate, Effective, Safe, Accessible Vitamin D 5000 units 1 tablet three times a week - Appropriate, Effective, Safe, Accessible -Medications previously tried: none  -Recommend [phone] units of vitamin D daily. Recommend 1200 mg of calcium daily from dietary and supplemental sources. Recommend weight-bearing and muscle strengthening exercises for building and maintaining bone density. -Recommended  switching to calcium citrate for better absorption.  Hypothyroidism (Goal: 2.5-4.5) -Uncontrolled -Current treatment  Synthroid 75 mcg 1 tablet daily except skip Sundays - Appropriate, Query effective, Safe, Accessible -Medications previously tried: none  -Recommended to continue current medication Counseled on importance of taking on an empty stomach around the same time each morning.  Chronic kidney disease (Goal: minimize progression of CKD) -Controlled -Current treatment  Losartan 50 mg 1 tablet daily - Appropriate, Effective, Safe, Accessible -Medications previously tried: none  -Recommended for patient to discuss [place] or [place] with endocrinologist.  Parkinson's disease (Goal: minimize symptoms) -Controlled -Current treatment  Carbidopa-levodopa 25-100 mg 2 tablets three times daily - Appropriate, Effective, Safe, Accessible -Medications previously tried: none  -Recommended to continue current medication Counseled on importance of taking on empty stomach.  Health Maintenance -Vaccine gaps: shingrix -Current therapy:  Aspirin 81 mg 1 tablet daily (carotid artery disease) Systane eye drops as needed Vitamin B12 daily -Educated on Cost vs benefit of each product must be carefully weighed by individual consumer -Patient is satisfied with current therapy and denies issues -Recommended to continue current medication  Patient Goals/Self-Care Activities Patient will:  - take medications as prescribed as evidenced by patient report and record review check glucose daily, document, and provide at future appointments check blood pressure weekly,  document, and provide at future appointments  Follow Up Plan: The care management team will reach out to the patient again over the next 14 days.          Medication Assistance: None required.  Patient affirms current coverage meets needs.  Compliance/Adherence/Medication fill history: Care Gaps: Shingrix, COVID booster,  foot exam, colonoscopy, eye exam, GFR, influenza Last BP - 138/58 on 02/21/2022 Last A1C - 9.5 on 05/26/2022  Star-Rating Drugs: Atorvastatin 40 mg - last filled 07/12/2022 30 DS at [facility] Losartan 50 mg - last filled 07/12/2022 30 DS at [facility] Actos 45 mg - last filled 07/12/2022 30 DS at [facility]  Patient's preferred pharmacy is:  [id number] [place] [address]Phone: [phone]: [phone] [facility] [place] [place]Phone: [phone]: [phone] Uses pill box? Yes Pt endorses 95% compliance  We discussed: Benefits of medication synchronization, packaging and delivery as well as enhanced pharmacist oversight with [facility]. Patient decided to: Utilize [facility] pharmacy for medication synchronization, packaging and delivery  Care Plan and Follow Up Patient Decision:  Patient agrees to Care Plan and Follow-up.  Plan: The care management team will reach out to the patient again over the next 7 days.  MJeni Salles PharmD, BWest IshpemingPharmacist LLambertat BRosebud

## 2022-08-01 ENCOUNTER — Other Ambulatory Visit: Payer: Self-pay | Admitting: Neurology

## 2022-08-03 ENCOUNTER — Telehealth: Payer: Self-pay | Admitting: Pharmacist

## 2022-08-03 NOTE — Chronic Care Management (AMB) (Signed)
Chronic Care Management Pharmacy Assistant   Name: Pamela Patton  MRN: [id number] DOB: 02/28/1946  Reason for Encounter: Medication Review / Medication Coordination Call  Recent office visits:  None  Recent consult visits:  None  Hospital visits:  None  Medications: Outpatient Encounter Medications as of 08/03/2022  Medication Sig Note   aspirin 81 MG tablet Take 81 mg by mouth daily with lunch.     atorvastatin (LIPITOR) 40 MG tablet Take 40 mg by mouth daily with lunch.     Blood Pressure Monitoring (BLOOD PRESSURE CUFF) MISC 1 Product by Does not apply route as needed.    Calcium Carbonate-Vitamin D 600-400 MG-UNIT tablet Take 1 tablet by mouth 3 (three) times a week.     carbidopa-levodopa (SINEMET IR) 25-100 MG tablet TAKE TWO TABLETS BY MOUTH EVERY MORNING and TAKE TWO TABLETS BY MOUTH AT NOON and TAKE TWO TABLETS BY MOUTH EVERYDAY AT BEDTIME    Cholecalciferol (VITAMIN D PO) Take 5,000 Units by mouth 3 (three) times a week.     Cyanocobalamin (VITAMIN B12 PO) Take 5,000 mcg by mouth 2 (two) times a week.    glucose 4 GM chewable tablet Chew 1 tablet by mouth as needed for low blood sugar. 06/30/2020: As needed    glucose blood (ACCU-CHEK AVIVA PLUS) test strip Use to test blood sugar 4 times a day (Dx: E11.65)    hydrochlorothiazide (HYDRODIURIL) 25 MG tablet Take 25 mg by mouth daily with lunch.     insulin aspart (NOVOLOG) 100 UNIT/ML injection Inject into the skin 3 (three) times daily before meals. Inject based on sliding scale    insulin glargine (LANTUS) 100 UNIT/ML injection Inject 0.05 mLs (5 Units total) into the skin every evening. (Patient taking differently: Inject 5 Units into the skin daily.)    Insulin Syringe-Needle U-100 (INSULIN SYRINGE .3CC/31GX5/16") 31G X 5/16" 0.3 ML MISC Use as directed for Lantus injection once a day    levothyroxine (SYNTHROID, LEVOTHROID) 75 MCG tablet Take 75 mcg by mouth daily. 12/15/2021: Patient not taking 1 day a week (Sunday)     losartan (COZAAR) 50 MG tablet Take 1 tablet (50 mg total) by mouth daily.    NONFORMULARY OR COMPOUNDED ITEM [facility]:  Antifungal - Terbinafine 3%, Fluconazole 2%, Tea Tree Oil 5%, Urea 10%, Ibuprofen 2% in DMSO Suspension #55m. Apply to the affected nail(s) once (at bedtime) or twice daily.    Nutritional Supplements (FEEDING SUPPLEMENT, GLUCERNA 1.2 CAL,) LIQD Take 237 mLs by mouth daily as needed. 06/30/2020: In place of meal when out at time   OSUPERVALU INCLancets 344IMISC Use to test blood sugar 4 times a day (Dx: E11.65)    Pioglitazone HCl (ACTOS PO) Take 45 mg by mouth daily with lunch.     Polyethyl Glycol-Propyl Glycol (SYSTANE) 0.4-0.3 % GEL ophthalmic gel Place 1 application into both eyes at bedtime as needed (FOR EYES).    Facility-Administered Encounter Medications as of 08/03/2022  Medication   0.9 %  sodium chloride infusion   Reviewed chart for medication changes ahead of medication coordination call.  BP Readings from Last 3 Encounters:  02/21/22 (!) 138/58  11/01/21 136/78  08/13/21 118/60    Lab Results  Component Value Date   HGBA1C 8.1 (H) 09/11/2018     Patient obtains medications through Adherence Packaging  30 Days    Last adherence delivery included:  Aspirin 81 mg 1 tablet at dinner Atorvastatin 40 mg 1 tablet at dinner Carbidopa  Levodopa 25/100 mg 2 tablets at breakfast, lunch and bedtime Hydrochlorothiazide 25 mg 1 tablet at dinner Synthroid 75 mcg 1 tablet before breakfast daily except Sunday Losartan 50 mg 1 tablet at dinner Pioglitazone 45 mg 1 tablet at dinner Novolog flexpen 100 un/ml: inject 3 units with meals plus 1-6 un per sliding scale. (Sent to [facility] by Dr. Daine Floras)   Patient declined last month:  Lantus 100 un/ml inject 5 units daily (due 10/31/22) Accuchek guide strips use 4 times daily (due 09/08/22) Accuchek softclix lancets use 4 times daily (due 09/08/22)   Medication discontinued: Humalog Kwikpen  discontinued (patient is aware to discontinue this medication)   Patient is due for next adherence delivery on: 08/15/2022    Called patient and reviewed medications and coordinated delivery.   This delivery to include: Aspirin 81 mg 1 tablet at dinner Atorvastatin 40 mg 1 tablet at dinner Carbidopa Levodopa 25/100 mg 2 tablets at breakfast, lunch and bedtime Hydrochlorothiazide 25 mg 1 tablet at dinner Synthroid 75 mcg 1 tablet before breakfast daily except Sunday Losartan 50 mg 1 tablet at dinner Pioglitazone 45 mg 1 tablet at dinner Novolog flexpen 100 un/ml: inject 3 units with meals plus 1-6 un per sliding scale.    Patient will need a short fill: No short fill needed   Coordinated acute fill: No acute fill needed   Patient declined the following medications:  Lantus 100 un/ml inject 5 units daily (due 10/31/22) Accuchek guide strips use 4 times daily (due 09/08/22) Accuchek softclix lancets use 4 times daily (due 09/08/22)   Confirmed delivery date of 08/15/2022, advised patient that pharmacy will contact them the morning of delivery.   Care Gaps: AWV - scheduled 01/02/2023  Last BP - 138/58 on 02/21/2022 Last A1C - 9.5 on 05/26/2022 Shingrix - never done [facility] - overdue Foot exam - overdue Colonoscopy - overdue GFR - overdue Covid - overdue Flu - due  Star Rating Drugs: Atorvastatin 40 mg - last filled 07/12/2022 30 DS at [facility] Losartan 50 mg - last filled 07/12/2022 30 DS at [facility] Actos 45 mg - last filled 07/12/2022 30 DS at [place] [phone]

## 2022-08-11 ENCOUNTER — Ambulatory Visit: Payer: Medicare PPO | Admitting: Family Medicine

## 2022-08-11 ENCOUNTER — Encounter: Payer: Self-pay | Admitting: Family Medicine

## 2022-08-11 VITALS — BP 124/60 | HR 66 | Temp 98.4°F | Ht 66.0 in | Wt 130.8 lb

## 2022-08-11 DIAGNOSIS — Z794 Long term (current) use of insulin: Secondary | ICD-10-CM | POA: Diagnosis not present

## 2022-08-11 DIAGNOSIS — Z23 Encounter for immunization: Secondary | ICD-10-CM

## 2022-08-11 DIAGNOSIS — I10 Essential (primary) hypertension: Secondary | ICD-10-CM | POA: Diagnosis not present

## 2022-08-11 DIAGNOSIS — E1129 Type 2 diabetes mellitus with other diabetic kidney complication: Secondary | ICD-10-CM

## 2022-08-11 NOTE — Patient Instructions (Signed)
Talk with Dr. Grandville Silos about adding Wilder Glade or Jardiance to help with your blood sugars and help kidney function  Talk with Dr. Carles Collet about the fecal incontinence.

## 2022-08-11 NOTE — Assessment & Plan Note (Signed)
Current hypertension medications:       Sig   hydrochlorothiazide (HYDRODIURIL) 25 MG tablet (Taking) Take 25 mg by mouth daily with lunch.    losartan (COZAAR) 50 MG tablet (Taking) Take 1 tablet (50 mg total) by mouth daily.      BP performed in office today and is well controlled, will continue the above medications as prescribed.

## 2022-08-11 NOTE — Progress Notes (Signed)
Established Patient Office Visit  Subjective   Patient ID: Pamela Patton, female    DOB: [birth year]  Age: 76 y.o. MRN: [id number]  Chief Complaint  Patient presents with   Establish Care    Pt is here for transition of care visit.   Diabetes-- patient has been seeing endocrine for her diabetes. States that she thinks her blood sugars are getting better, states that most of her readings are under 200, she gets an occasional blood sugar over 200. I have reviewed her labs from her endocrinologist-- her last A1C was 9.5 and her GFR was 38, urine microalbumin was 18 on 05/26/2022.   Parkinson's- patient reports she feels very slow to get ready in the mornings. States that she is on sinemet for this. States that her appetite is ok, no weight loss since the last visit. Patient reports occasional fecal incontinence, states that it comes and goes. Pt sometimes is not able to control her bowel movements. She has a neurologist that she sees for her parkinson's disease.   HTN -- BP in office today is well controlled, she reports compliance with her medications, she denies any chest pain, SOB or headaches.    Current Outpatient Medications  Medication Instructions   aspirin 81 mg, Oral, Daily with lunch   atorvastatin (LIPITOR) 40 mg, Oral, Daily with lunch   Blood Pressure Monitoring (BLOOD PRESSURE CUFF) MISC 1 Product, Does not apply, As needed   Calcium Carbonate-Vitamin D 600-400 MG-UNIT tablet 1 tablet, Oral, 3 times weekly   carbidopa-levodopa (SINEMET IR) 25-100 MG tablet TAKE TWO TABLETS BY MOUTH EVERY MORNING and TAKE TWO TABLETS BY MOUTH AT NOON and TAKE TWO TABLETS BY MOUTH EVERYDAY AT BEDTIME   Cholecalciferol (VITAMIN D PO) 5,000 Units, Oral, 3 times weekly   Cyanocobalamin (VITAMIN B12 PO) 5,000 mcg, Oral, 2 times weekly   glucose 4 GM chewable tablet 1 tablet, Oral, As needed   glucose blood (ACCU-CHEK AVIVA PLUS) test strip Use to test blood sugar 4 times a day (Dx: E11.65)    HumaLOG 3 Units, Subcutaneous, As directed, Sliding scale   hydrochlorothiazide (HYDRODIURIL) 25 mg, Oral, Daily with lunch   insulin glargine (LANTUS) 5 Units, Subcutaneous, Every evening   Insulin Syringe-Needle U-100 (INSULIN SYRINGE .3CC/31GX5/16") 31G X 5/16" 0.3 ML MISC Use as directed for Lantus injection once a day   levothyroxine (SYNTHROID) 75 mcg, Oral, Daily   losartan (COZAAR) 50 mg, Oral, Daily   NONFORMULARY OR COMPOUNDED ITEM [facility]:  Antifungal - Terbinafine 3%, Fluconazole 2%, Tea Tree Oil 5%, Urea 10%, Ibuprofen 2% in DMSO Suspension #67m. Apply to the affected nail(s) once (at bedtime) or twice daily.   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     Objective:     BP 124/60 (BP Location: Left Arm, Patient Position: Sitting, Cuff Size: Normal)   Pulse 66   Temp 98.4 F (36.9 C) (Oral)   Ht '5\' 6"'$  (1.676 m)   Wt 130 lb 12.8 oz (59.3 kg)   SpO2 99%   BMI 21.11 kg/m      Physical Exam Vitals reviewed.  Constitutional:      Appearance: Normal appearance. She is well-groomed and normal weight.  Eyes:     Conjunctiva/sclera: Conjunctivae normal.  Neck:     Thyroid: No thyromegaly.  Cardiovascular:     Rate and Rhythm: Normal rate and regular rhythm.     Pulses: Normal pulses.     Heart sounds: S1 normal and S2 normal.  Pulmonary:     Effort: Pulmonary effort is normal.     Breath sounds: Normal breath sounds and air entry.  Abdominal:     General: Bowel sounds are normal.  Musculoskeletal:     Right lower leg: No edema.     Left lower leg: No edema.  Neurological:     Mental Status: She is alert and oriented to person, place, and time. Mental status is at baseline.     Gait: Gait abnormal.  Psychiatric:        Mood and Affect: Mood normal. Affect is flat.        Speech: Speech is delayed.        Behavior: Behavior normal.        Cognition and Memory: Cognition is impaired.        Judgment: Judgment normal.      Results for orders placed or performed in visit on 08/11/22  CBC and differential  Result Value Ref Range   Hemoglobin 9.5 (A) 12.0 - 16.0  Protein / creatinine ratio, urine  Result Value Ref Range   Albumin, U 18        The 10-year ASCVD risk score (Arnett DK, et al., 2019) is: 32.8%    Assessment & Plan:   Problem List Items Addressed This Visit       Cardiovascular and Mediastinum   HTN (hypertension) - Primary    Current hypertension medications:       Sig   hydrochlorothiazide (HYDRODIURIL) 25 MG tablet (Taking) Take 25 mg by mouth daily with lunch.    losartan (COZAAR) 50 MG tablet (Taking) Take 1 tablet (50 mg total) by mouth daily.     BP performed in office today and is well controlled, will continue the above medications as prescribed.        Endocrine   DM (diabetes mellitus), type 2 with renal complications    Uncontrolled, this is managed by her endocrinologist. Last A1C 05/26/22 was 9.5. I  would favor adding farxiga or jardiance to her regimen. This would help slow the progression of her renal disease as well as helping to control her blood sugars. Pt states they are trying to get her a CGM to help her control her blood sugars better.  Pt is already enrolled in the CCM program.       Relevant Medications   HUMALOG 100 UNIT/ML injection   Other Visit Diagnoses     Need for immunization against  influenza       Relevant Orders   Flu Vaccine QUAD High Dose(Fluad) (Completed)       Return in about 6 months (around 02/09/2023) for follow up HTN and DM.    Farrel Conners, MD

## 2022-08-11 NOTE — Assessment & Plan Note (Addendum)
Uncontrolled, this is managed by her endocrinologist. Last A1C 05/26/22 was 9.5. I would favor adding farxiga or jardiance to her regimen. This would help slow the progression of her renal disease as well as helping to control her blood sugars. Pt states they are trying to get her a CGM to help her control her blood sugars better.  Pt is already enrolled in the CCM program.

## 2022-08-23 ENCOUNTER — Ambulatory Visit: Payer: Medicare PPO | Admitting: Neurology

## 2022-08-24 ENCOUNTER — Telehealth: Payer: Self-pay | Admitting: Family Medicine

## 2022-08-24 DIAGNOSIS — Z794 Long term (current) use of insulin: Secondary | ICD-10-CM

## 2022-08-24 NOTE — Telephone Encounter (Signed)
-----   Message from Neldon Labella, RN sent at 08/11/2022 10:53 AM EST ----- Regarding: [id number]  - Referral to Continue CCM services Good Morning Dr. Legrand Como, Ms. Avallone is currently being followed by the CCM team and requires a new referral.  She is currently being followed for: Chronic Condition 1: DM Chronic Condition 2: HTN   We can continue with the following goals: Goal: CCM (DIABETES) EXPECTED OUTCOME: MONITOR, SELF-MANAGE AND REDUCE SYMPTOMS OF DIABETES  Goal: CCM (HYPERTENSION) EXPECTED OUTCOME:  MONITOR, SELF- MANAGE AND REDUCE SYMPTOMS OF HYPERTENSION  Thank You!

## 2022-08-31 ENCOUNTER — Telehealth: Payer: Self-pay | Admitting: Pharmacist

## 2022-08-31 NOTE — Progress Notes (Signed)
Chronic Care Management Pharmacy Assistant   Name: Pamela Patton  MRN: [id number] DOB: 10-Oct-1945  Reason for Encounter: Medication Review / Medication Coordination Call   Recent office visits:  08/11/2022 Pamela Patton - Patient was seen for primary hypertension and additional concerns. Discontinued Novolog. Follow up in 6 months.   Recent consult visits:  None  Hospital visits:  None  Medications: Outpatient Encounter Medications as of 08/31/2022  Medication Sig Note   aspirin 81 MG tablet Take 81 mg by mouth daily with lunch.     atorvastatin (LIPITOR) 40 MG tablet Take 40 mg by mouth daily with lunch.     Blood Pressure Monitoring (BLOOD PRESSURE CUFF) MISC 1 Product by Does not apply route as needed.    Calcium Carbonate-Vitamin D 600-400 MG-UNIT tablet Take 1 tablet by mouth 3 (three) times a week.     carbidopa-levodopa (SINEMET IR) 25-100 MG tablet TAKE TWO TABLETS BY MOUTH EVERY MORNING and TAKE TWO TABLETS BY MOUTH AT NOON and TAKE TWO TABLETS BY MOUTH EVERYDAY AT BEDTIME    Cholecalciferol (VITAMIN D PO) Take 5,000 Units by mouth 3 (three) times a week.     Cyanocobalamin (VITAMIN B12 PO) Take 5,000 mcg by mouth 2 (two) times a week.    glucose 4 GM chewable tablet Chew 1 tablet by mouth as needed for low blood sugar. 06/30/2020: As needed    glucose blood (ACCU-CHEK AVIVA PLUS) test strip Use to test blood sugar 4 times a day (Dx: E11.65)    HUMALOG 100 UNIT/ML injection Inject 3 Units into the skin as directed. Sliding scale    hydrochlorothiazide (HYDRODIURIL) 25 MG tablet Take 25 mg by mouth daily with lunch.     insulin glargine (LANTUS) 100 UNIT/ML injection Inject 0.05 mLs (5 Units total) into the skin every evening. (Patient taking differently: Inject 5 Units into the skin daily.)    Insulin Syringe-Needle U-100 (INSULIN SYRINGE .3CC/31GX5/16") 31G X 5/16" 0.3 ML MISC Use as directed for Lantus injection once a day    levothyroxine (SYNTHROID, LEVOTHROID) 75  MCG tablet Take 75 mcg by mouth daily. 12/15/2021: Patient not taking 1 day a week (Sunday)    losartan (COZAAR) 50 MG tablet Take 1 tablet (50 mg total) by mouth daily.    NONFORMULARY OR COMPOUNDED ITEM [facility]:  Antifungal - Terbinafine 3%, Fluconazole 2%, Tea Tree Oil 5%, Urea 10%, Ibuprofen 2% in DMSO Suspension #31m. Apply to the affected nail(s) once (at bedtime) or twice daily.    Nutritional Supplements (FEEDING SUPPLEMENT, GLUCERNA 1.2 CAL,) LIQD Take 237 mLs by mouth daily as needed. 06/30/2020: In place of meal when out at time   OSUPERVALU INCLancets 367TMISC Use to test blood sugar 4 times a day (Dx: E11.65)    Pioglitazone HCl (ACTOS PO) Take 45 mg by mouth daily with lunch.     Polyethyl Glycol-Propyl Glycol (SYSTANE) 0.4-0.3 % GEL ophthalmic gel Place 1 application into both eyes at bedtime as needed (FOR EYES).    Facility-Administered Encounter Medications as of 08/31/2022  Medication   0.9 %  sodium chloride infusion   Reviewed chart for medication changes ahead of medication coordination call.  BP Readings from Last 3 Encounters:  08/11/22 124/60  02/21/22 (!) 138/58  11/01/21 136/78    Lab Results  Component Value Date   HGBA1C 8.1 (H) 09/11/2018     Patient obtains medications through Adherence Packaging  30 Days    Last adherence delivery included:  Aspirin  81 mg 1 tablet at dinner Atorvastatin 40 mg 1 tablet at dinner Carbidopa Levodopa 25/100 mg 2 tablets at breakfast, lunch and bedtime Hydrochlorothiazide 25 mg 1 tablet at dinner Synthroid 75 mcg 1 tablet before breakfast daily except Sunday Losartan 50 mg 1 tablet at dinner Pioglitazone 45 mg 1 tablet at dinner Novolog flexpen 100 un/ml: inject 3 units with meals plus 1-6 un per sliding scale.    Patient declined last month:  Lantus 100 un/ml inject 5 units daily (due 10/31/22) Accuchek guide strips use 4 times daily (due 09/08/22) Accuchek softclix lancets use 4 times daily (due  09/08/22)    Medication discontinued: Humalog Kwikpen discontinued (patient is aware to discontinue this medication)   Patient is due for next adherence delivery on: 09/13/2022   Called patient and reviewed medications and coordinated delivery.   This delivery to include: Aspirin 81 mg 1 tablet at dinner Atorvastatin 40 mg 1 tablet at dinner Carbidopa Levodopa 25/100 mg 2 tablets at breakfast, lunch and bedtime Hydrochlorothiazide 25 mg 1 tablet at dinner Synthroid 75 mcg 1 tablet before breakfast daily except Sunday Losartan 50 mg 1 tablet at dinner Pioglitazone 45 mg 1 tablet at dinner Accuchek guide strips use 4 times daily Accuchek softclix lancets use 4 times daily    Patient declined the following medications:  Lantus 100 un/ml inject 5 units daily (due 10/31/22) Novolog  100 un/ml: inject 3 units with meals plus 1-6 un per sliding scale. (Discontinued)  Unable to confirm delivery date of 09/13/2022, after several attempts  Care Gaps: AWV - scheduled 01/02/2023 Last BP - 124/60 on 08/11/2022 Last A1C - 9.5 on 05/26/2022 Foot exam - overdue GFR - overdue Covid - overdue Shingrix - postponed  Star Rating Drugs: Atorvastatin 40 mg - last filled 08/09/2022 30 DS at [facility] Losartan 50 mg - last filled 08/09/2022 30 DS [facility] Pioglitazone 45 mg - last filled 08/09/2022 30 DS at [place] [phone]

## 2022-09-05 ENCOUNTER — Telehealth: Payer: Self-pay

## 2022-09-07 ENCOUNTER — Telehealth: Payer: Self-pay | Admitting: Neurology

## 2022-09-07 NOTE — Telephone Encounter (Signed)
Called patient to let her know her forms stated patient pick up and Mahina called patient in July and left her a message stating the paperwork was ready.  Patient cancelled her appointment in August so did not follow up until today. I called pateint she asked me to fax it to the [facility] and I did that for her

## 2022-09-07 NOTE — Telephone Encounter (Signed)
Patient called and states that she gave our office paperwork back in June for the [facility] and she states that they never got the paperwork she wants to speak to someone about this

## 2022-09-16 ENCOUNTER — Telehealth: Payer: Medicare PPO

## 2022-09-20 ENCOUNTER — Telehealth: Payer: Self-pay

## 2022-09-20 ENCOUNTER — Telehealth: Payer: Medicare PPO

## 2022-09-20 NOTE — Telephone Encounter (Signed)
   CCM RN Visit Note    09/20/22 Name: Pamela Patton MRN: [id number]      DOB: 1946-04-17  Subjective: Pamela Patton is a 76 y.o. year old female who is a primary care patient of [name], Jerilynn Mages, MD. The patient was referred to the Chronic Care Management team for assistance with care management needs subsequent to provider initiation of CCM services and plan of care.      An unsuccessful outreach attempt was made today for a scheduled CCM visit.    PLAN: A HIPAA compliant phone message was left for the patient providing contact information and requesting a return call.   Horris Latino RN Care Manager/Chronic Care Management [phone]

## 2022-09-20 NOTE — Telephone Encounter (Signed)
   CCM RN Visit Note    Name: Pamela Patton MRN: [id number]      DOB: 09-17-1946  Subjective: Pamela Patton is a 76 y.o. year old female who is a primary care patient of Farrel Conners, MD. The patient was referred to the Chronic Care Management team for assistance with care management needs subsequent to provider initiation of CCM services and plan of care.      Successful outreach with [name] regarding plan for CCM services. Agreeable to completing telephonic outreach on 09/20/22. Agreed to call with urgent concerns if needed prior to scheduled outreach.  PLAN: Appointment scheduled for 09/20/22.   Horris Latino RN Care Manager/Chronic Care Management [phone]

## 2022-09-27 ENCOUNTER — Encounter: Payer: Self-pay | Admitting: *Deleted

## 2022-09-27 NOTE — Telephone Encounter (Signed)
This encounter was created in error - please disregard.

## 2022-10-04 ENCOUNTER — Telehealth: Payer: Self-pay | Admitting: Pharmacist

## 2022-10-04 NOTE — Progress Notes (Signed)
error 

## 2022-10-11 ENCOUNTER — Other Ambulatory Visit: Payer: Self-pay | Admitting: Urology

## 2022-10-11 DIAGNOSIS — N2889 Other specified disorders of kidney and ureter: Secondary | ICD-10-CM

## 2022-10-18 ENCOUNTER — Ambulatory Visit: Payer: Medicare PPO | Admitting: Neurology

## 2022-10-19 ENCOUNTER — Encounter (INDEPENDENT_AMBULATORY_CARE_PROVIDER_SITE_OTHER): Payer: Medicare PPO | Admitting: Neurology

## 2022-10-19 ENCOUNTER — Telehealth: Payer: Self-pay | Admitting: Neurology
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
## 2022-10-21 ENCOUNTER — Ambulatory Visit
Admission: RE | Admit: 2022-10-21 | Discharge: 2022-10-21 | Disposition: A | Discharge status: Home / Self Care | Payer: Medicare PPO | Source: Ambulatory Visit | Attending: Urology | Admitting: Urology

## 2022-10-21 DIAGNOSIS — N2889 Other specified disorders of kidney and ureter: Secondary | ICD-10-CM

## 2022-10-21 NOTE — Consult Note (Signed)
Chief Complaint: Left sided RCC  Referring Physician(s): Herrick,Benjamin W [name], Wells Guiles (Parkinson's physician)  History of Present Illness: Pamela Patton is a 77 y.o. female with complex past medical history significant for stage III chronic kidney disease, hypertension, hyperlipidemia, hypothyroidism and Parkinson disease who has been referred for evaluation of percutaneous management of left-sided renal mass.  The left-sided renal mass was initially discovered on abdominal CT performed on 06/16/2016 performed for weight loss and anemia.  At that time, an approximately 1.5 cm enhancing lesion was identified within the posterior inferior pole of the left kidney highly suspicious for renal cell carcinoma.  Subsequent abdominal MRI was performed on 06/21/2016 confirmed the lesion to be worrisome for a renal cell carcinoma.  Patient was subsequently referred to Dr. Louis Meckel who has been doing surveillance imaging of this lesion as the patient has been hesitant to undergo intervention given her medical comorbidities, primarily her Parkinson's disease.  Fortunately, her Parkinson disease has remained relatively stable for the past several years, but unfortunately the left-sided renal lesion has continued to demonstrate progressive growth. Most recent abdominal MRI performed 06/24/2022 demonstrates the lesion measures approximately 3.2 x 3.1 x 3.0 cm.  Given above, the patient is now interested in discussing potential percutaneous management.  The patient remains asymptomatic in regards to this incidentally discovered left-sided renal lesion.  Specifically, no unintentional weight loss or weight gain.  No hematuria.  No flank pain.   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   Carotid artery disease 03/21/2017   Cataract    immature    Chest pain summer 2012   Chicken pox    CKD (chronic kidney disease) 10/08/2012   Stage 3,  GFR 30-59 ml/min   Diverticulosis    difficulty with colonoscopy 2005   DM (diabetes mellitus), type 2 with renal complications 02/58/5277   Fracture    radial/ulnar 03/2016   GERD (gastroesophageal reflux disease)    Heart murmur    innocent murmur   High cholesterol    Hypertension associated with diabetes 02/22/2017   Hyperuricemia 10/31/2016   Hypothyroidism    s/p radioactive iodine tx for graves in 2001   Intestinal metaplasia of gastric mucosa    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   Parkinson's disease 08/08/2016   UTI (urinary tract infection)    Vitamin D deficiency 08/24/2012   Weight loss    eval with GI in 2016; s/p CT/MRI and PET scans    Past Surgical History:  Procedure Laterality Date   ABDOMINAL HYSTERECTOMY  1989   CATARACT EXTRACTION, BILATERAL     COLONOSCOPY     DG BARIUM SWALLOW (Cecilia HX)     9 yrs ago per pt due to incomplete colonsocopy    Allergies: Patient has no known allergies.  Medications: Prior to Admission medications   Medication Sig Start Date End Date Taking? Authorizing Provider  aspirin 81 MG tablet Take 81 mg by mouth daily with lunch.     [provider]  atorvastatin (LIPITOR) 40 MG tablet Take 40 mg by mouth daily with lunch.     [provider]  Blood Pressure Monitoring (BLOOD PRESSURE CUFF) MISC 1 Product by Does not apply route as needed. 05/21/19   Lucretia Kern, DO  Calcium Carbonate-Vitamin D 600-400 MG-UNIT tablet Take 1 tablet by mouth 3 (three) times a week.     [provider]  carbidopa-levodopa (SINEMET IR)  25-100 MG tablet TAKE TWO TABLETS BY MOUTH EVERY MORNING and TAKE TWO TABLETS BY MOUTH AT NOON and TAKE TWO TABLETS BY MOUTH EVERYDAY AT BEDTIME 08/01/22   [name], Eustace Quail, DO  Cholecalciferol (VITAMIN D PO) Take 5,000 Units by mouth 3 (three) times a week.     [provider]  Cyanocobalamin (VITAMIN B12 PO) Take 5,000 mcg by mouth 2 (two) times a week.    [provider]   glucose 4 GM chewable tablet Chew 1 tablet by mouth as needed for low blood sugar.    [provider]  glucose blood (ACCU-CHEK AVIVA PLUS) test strip Use to test blood sugar 4 times a day (Dx: E11.65) 12/25/19   [provider]  HUMALOG 100 UNIT/ML injection Inject 3 Units into the skin as directed. Sliding scale 03/23/22   [provider]  hydrochlorothiazide (HYDRODIURIL) 25 MG tablet Take 25 mg by mouth daily with lunch.  06/29/12   [provider]  insulin glargine (LANTUS) 100 UNIT/ML injection Inject 0.05 mLs (5 Units total) into the skin every evening. Patient taking differently: Inject 5 Units into the skin daily. 11/01/21   Caren Macadam, MD  Insulin Syringe-Needle U-100 (INSULIN SYRINGE .3CC/31GX5/16") 31G X 5/16" 0.3 ML MISC Use as directed for Lantus injection once a day 12/25/19   [provider]  levothyroxine (SYNTHROID, LEVOTHROID) 75 MCG tablet Take 75 mcg by mouth daily.    [provider]  losartan (COZAAR) 50 MG tablet Take 1 tablet (50 mg total) by mouth daily. 12/18/21   Caren Macadam, MD  NONFORMULARY OR COMPOUNDED ITEM [facility]:  Antifungal - Terbinafine 3%, Fluconazole 2%, Tea Tree Oil 5%, Urea 10%, Ibuprofen 2% in DMSO Suspension #52m. Apply to the affected nail(s) once (at bedtime) or twice daily. 10/31/18   GMarzetta Board [name]  Nutritional Supplements (FEEDING SUPPLEMENT, GLUCERNA 1.2 CAL,) LIQD Take 237 mLs by mouth daily as needed.    [provider]  OneTouch Delica Lancets 358KMISC Use to test blood sugar 4 times a day (Dx: E11.65) 12/25/19   [provider]  Pioglitazone HCl (ACTOS PO) Take 45 mg by mouth daily with lunch.     [provider]  Polyethyl Glycol-Propyl Glycol (SYSTANE) 0.4-0.3 % GEL ophthalmic gel Place 1 application into both eyes at bedtime as needed (FOR EYES).    [provider]     Family History  Problem Relation Age of Onset    Cancer Mother        liver   Liver cancer Mother    Stroke Father 737  Diabetes Father    Hypertension Father    Diabetes Maternal Grandmother    Prostate cancer Maternal Grandfather    Pancreatic cancer Brother    Diabetes Sister    Cancer Sister    Diabetes Sister    Colon polyps Neg Hx    Colon cancer Neg Hx    Esophageal cancer Neg Hx    Rectal cancer Neg Hx    Stomach cancer Neg Hx     Social History   Socioeconomic History   Marital status: Single    Spouse name: Not on file   Number of children: 1   Years of education: 20   Highest education level: Professional school degree (e.g., MD, DDS, DVM, JD)  Occupational History   Occupation: retired    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[employer], anatomy and physiology  Tobacco Use   Smoking status: Never  Smokeless tobacco: Never  Vaping Use   Vaping Use: Never used  Substance and Sexual Activity   Alcohol use: No    Alcohol/week: 0.0 standard drinks of alcohol   Drug use: No   Sexual activity: Not on file  Other Topics Concern   Not on file  Social History Narrative   Work: recently retired Firefighter, 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 Situation:  Lives alone, no family around, good friendsSpiritual Beliefs: christian, very involved with her church - [employer] churchLifestyle: exercising 5 days per week, trying to eat healthy   Caffeine 1/2 cup daily   Social Determinants of Health   Financial Resource Strain: Low Risk  (12/29/2021)   Overall Financial Resource Strain (CARDIA)    Difficulty of Paying Living Expenses: Not hard at all  Food Insecurity: No Food Insecurity (12/29/2021)   Hunger Vital Sign    Worried About Running Out of Food in the Last Year: Never true    Cameron in the Last Year: Never true  Transportation Needs: No Transportation Needs (12/29/2021)   PRAPARE - Hydrologist (Medical): No    Lack of Transportation  (Non-Medical): No  Physical Activity: Insufficiently Active (12/29/2021)   Exercise Vital Sign    Days of Exercise per Week: 4 days    Minutes of Exercise per Session: 30 min  Stress: No Stress Concern Present (12/29/2021)   [place]    Feeling of Stress : Not at all  Social Connections: Moderately Integrated (12/29/2021)   Social Connection and Isolation Panel [NHANES]    Frequency of Communication with Friends and Family: More than three times a week    Frequency of Social Gatherings with Friends and Family: More than three times a week    Attends Religious Services: More than 4 times per year    Active Member of [employer] or Organizations: Yes    Attends Music therapist: More than 4 times per year    Marital Status: Never married    ECOG Status: 2 - Symptomatic, <50% confined to bed  Review of Systems  Review of Systems: A 12 point ROS discussed and pertinent positives are indicated in the HPI above.  All other systems are negative.  Physical Exam No direct physical exam was performed (except for noted visual exam findings with Video Visits).   Vital Signs: There were no vitals taken for this visit.  Imaging:  CT abdomen and pelvis - 06/16/2026 Abdominal MRI - 06/21/2016; 03/17/2017; 06/07/2018; 06/05/2019; 07/20/2020; 04/17/2021; 06/24/2022  Review of most recent abdominal MRI demonstrates an approximately 3.1 x 3 0.2 x 3.1 x 3.0 cm enhancing lesion involving the posterior inferior pole of the left kidney (coronal image 18, series 17; axial image 54, series 13).  The left-sided renal vein appears widely patent.No evidence of metastatic disease to the imaged abdomen.  Lesion has demonstrated slow interval growth since initial abdominal CT performed 06/2016, previously, 1.5 cm   Labs:  CBC: Recent Labs    05/26/22 0000  HGB 9.5*    COAGS: No results for input(s): "INR", "APTT" in the last 8760  hours.  BMP: No results for input(s): "NA", "K", "CL", "CO2", "GLUCOSE", "BUN", "CALCIUM", "CREATININE", "GFRNONAA", "GFRAA" in the last 8760 hours.  Invalid input(s): "CMP"  LIVER FUNCTION TESTS: No results for input(s): "BILITOT", "AST", "ALT", "ALKPHOS", "PROT", "ALBUMIN" in the last 8760 hours.  TUMOR MARKERS:  No results for input(s): "AFPTM", "CEA", "CA199", "CHROMGRNA" in the last 8760 hours.  Assessment and Plan:  SHAKEELA RABADAN is a 77 y.o. female with complex past medical history significant for stage III chronic kidney disease, hypertension, hyperlipidemia, hypothyroidism and Parkinson disease who has been referred for evaluation of percutaneous management of left-sided renal mass.  The left-sided renal mass was initially discovered on abdominal CT performed on 06/16/2016 performed for weight loss and anemia.  At that time, an approximately 1.5 cm enhancing lesion was identified within the posterior inferior pole of the left kidney highly suspicious for renal cell carcinoma subsequently evaluated on abdominal MRI performed on 06/21/2016 confirming the lesion to be worrisome for a renal cell carcinoma.  Since that time Dr. Louis Meckel has been doing surveillance imaging of this lesion as the patient has been hesitant to undergo intervention given her medical comorbidities, primarily her Parkinson's disease.  Fortunately, her Parkinson disease has remained relatively stable for the past several years, but unfortunately the left-sided renal lesion has continued to demonstrate progressive growth. Most recent abdominal MRI performed 06/24/2022 demonstrates the lesion measures approximately 3.2 x 3.1 x 3.0 cm.  Given above, the patient is now interested in discussing potential percutaneous management.  She remains asymptomatic in regards to this incidentally discovered left-sided renal lesion.  The following examinations were reviewed in detail: CT abdomen and pelvis - 06/16/2026 Abdominal MRI  - 06/21/2016; 03/17/2017; 06/07/2018; 06/05/2019; 07/20/2020; 04/17/2021; 06/24/2022  Review of most recent abdominal MRI demonstrates an approximately 3.1 x 3 0.2 x 3.1 x 3.0 cm enhancing lesion involving the posterior inferior pole of the left kidney (coronal image 18, series 17; axial image 54, series 13) compatible with a renal cell carcinoma.  The lesion has demonstrated slow interval growth since initial abdominal CT performed 06/2016, previously, 1.5 cm, with calculated growth rate of approximately 2 mm/year.  The left-sided renal vein appears widely patent.  No evidence of metastatic disease to the imaged abdomen.  Potential treatment strategies were discussed at length with the patient including definitive surgical resection, conservative management and cryoablation.  At the present time, the patient wishes to avoid surgical intervention.    I explained that cryoablation is performed using imaging guidance to place in her case likely 3-4 cryoablation probes to treat the enlarging renal cell carcinoma.  I explained that the procedure is performed with curative intent, however given the size of the lesion there is a chance that there may be an incomplete ablation which may require repeat treatment in the future.  I explained that procedural outcome is evaluated with serial postprocedural imaging typically with abdominal MRI performed every 3 months following the procedure for the first year, followed by every 6 months for the second year, and a follow-up scan 3 years following the procedural date.  I explained risks associated with the procedure including the risk of general anesthesia, bleeding/hematuria, worsening renal function, infection, damage to adjacent organ such as the intestines and or postprocedural discomfort and/or  left anterior thigh paresthesias (due to irritation of the lateral femoral cutaneous nerve).  I explained to the patient that the lesion is presently at the upper limits of what  we are able to successfully treat with cryoablation modality as it measures 3.2 cm in maximal diameter.  If she were to consider continued conservative management with surveillance, she will likely NOT be a candidate for cryoablation in the future.  I explained that while I am uncertain and this slowly growing incidentally discovered left-sided renal lesion  will ever have a minimal impact on her life as she is 77 years old with Parkinson's, however given the size of the lesion, she will likely not be a cryoablation candidate in the future.  Following this prolonged and detailed conversation, the patient is now willing to undergo attempted cryoablation.  As such, this procedure will be performed with general anesthesia at the next available treatment date.  The procedure will entail an overnight admission to the hospital for continued observation and PCA usage.    The patient knows to call the interventional radiology clinic with any future questions or concerns.  PLAN: - Schedule left sided renal cryoablation with general anesthesia.  Procedure will entail an overnight admission for continued observation and PCA usage  Thank you for this interesting consult.  I greatly enjoyed meeting Pamela Patton and look forward to participating in their care.  A copy of this report was sent to the requesting provider on this date.  Electronically Signed: Sandi Mariscal 10/21/2022, 10:05 AM   I spent a total of 40 Minutes in remote  clinical consultation, greater than 50% of which was counseling/coordinating care for left sided renal lesion.    Visit type: Audio only (telephone). Audio (no video) only due to patient's lack of internet/smartphone capability. Alternative for in-person consultation at [facility], [address], [place], [place]. This visit type was conducted due to national recommendations for restrictions regarding the COVID-19 Pandemic (e.g. social distancing).  This format is felt  to be most appropriate for this patient at this time.  All issues noted in this document were discussed and addressed.

## 2022-10-24 ENCOUNTER — Encounter: Payer: Self-pay | Admitting: Neurology

## 2022-10-24 ENCOUNTER — Ambulatory Visit (INDEPENDENT_AMBULATORY_CARE_PROVIDER_SITE_OTHER): Payer: Medicare PPO | Admitting: Neurology

## 2022-10-24 VITALS — BP 122/78 | HR 82 | Ht 66.0 in | Wt 125.0 lb

## 2022-10-24 DIAGNOSIS — G20A1 Parkinson's disease without dyskinesia, without mention of fluctuations: Secondary | ICD-10-CM | POA: Diagnosis not present

## 2022-10-24 NOTE — Patient Instructions (Addendum)
If you are interested in the driving assessment, you can contact [employer] in [place]. [phone].  [place] [phone]  [facility] does an evaluation as well and we can refer you there.  They do generally have a longer wait for assessment.  Local and Online Resources for Power over Parkinson's Group  January 2024   LOCAL Flasher PARKINSON'S GROUPS   Power over Parkinson's Group:    Power Over Parkinson's Patient Education Group will be Wednesday, January 10th-*Hybrid meting*- in person at [facility] location and via [facility], 2:00-3:00 pm.   Starting in November, Power over [employer] and Care Partner Groups will meet together, with plans for separate break out session for caregivers (*this will be evolving over the next few months) Upcoming Power over Parkinson's Meetings/Care Partner Support:  2nd Wednesdays of the month at 2 pm:   January 10th, February 14th [place] at [email or web address] if interested in participating in this group    [place]! Moves Dynegy Instructor-Led Classes offering at [employer]!  TUESDAYS and Wednesdays 1-2 pm.   Contact Vonna Kotyk at  [email or web address]  or [phone] (Tuesday classes are modified for chair and standing only) Drumming for Parkinson's will be held on 2nd and 4th Mondays at 11:00 am.   Located at the [place] ([place].)  Contact Doylene Canning at [email or web address] or [place] Class, Mondays at 11 am.  Call [phone] for details Let's Try Pickleball-$25 for 6 weeks of Pickleball in [place].  Contact Corwin Levins for more details and for dates.  [email or web address] SAVE THE DATE and REGISTER:  [place] Chapter of Parkinson's Foundation:  Parkinson's Symposium.  Conversations about Parkinson's.   Saturday, December 03, 2022, 9:00 am-2:00 pm.  [place], *In person or online via [place]*.  Register at MusicTeasers.[email or web address] or call Beverlee Nims at [phone].   [place]:  [email or web address]  PD Health at Home continues:  Mindfulness Mondays, Wellness Wednesdays, Fitness Fridays   Upcoming Education:    Environmental health practitioner your Voice:  Air cabin crew. Wednesday, January 3rd,  1-2 pm  Managing Weight Loss & Retaining Muscle Mass.  Wednesday, Jan 10th, 1-2 pm Changes in Speech and Voice.  Wednesday, January 17th, 1-2 pm Register for virtual education and Patent attorney (webinars) at DebtSupply.pl Please check out their website to sign up for emails and see their full online offerings      [place]:  [email or web address]   Third Thursday Webinars:  On the third Thursday of every month at 12 p.m. ET, join our free live webinars to learn about various aspects of living with Parkinson's disease and our work to speed medical breakthroughs.  Upcoming Webinar:  New Year, New Moves!  Explore Exercise for Life with Parkinson's.  Thursday, January 18th at 12 noon. Check out additional information on their website to see their full online offerings    [facility]:  [email or web address]  Upcoming Webinar:   Physical Therapy and Parkinson's.  Thursday, January 11th, 2 pm  Webinar Series:  Living with Parkinson's Meetup.   Third Thursdays each month, 3 pm  Care Partner Monthly Meetup.  With Robin Searing Phinney.  First Tuesday of each month, 2 pm  Check out additional information to Live Well Today on their website    Parkinson and Movement Disorders (PMD) Alliance:  www.[email or web address]  [employer]  Online:  Online Education Events  Sign up for emails, which are sent weekly to give you updates on programming and online offerings    Parkinson's  Association of the [place]:  www.[email or web address]  Information on online support groups, education events, and online exercises including Yoga, Parkinson's exercises and more-LOTS of information on links to PD resources and online events  Virtual Support Group through Parkinson's Association of the [place]; next one is scheduled for Wednesday, Feb 7th  MOVEMENT AND EXERCISE OPPORTUNITIES  PWR! Moves Classes at [place].  Wednesdays 10 and 11 am.   Contact Amy Marriott, PT [email or web address] if interested.  NEW PWR! Moves Class offerings at [employer].  *TUESDAYS* and Wednesdays 1-2 pm.    Contact Vonna Kotyk at  [email or web address]    Parkinson's Wellness Recovery (PWR! Moves)  www.[email or web address]  Info on the PWR! Virtual Experience:  You will have access to our expertise?through self-assessment, guided plans that start with the PD-specific fundamentals, educational content, tips, Q&A with an expert, and a growing Art therapist of PD-specific pre-recorded and live exercise classes of varying types and intensity - both physical and cognitive! If that is not enough, we offer 1:1 wellness consultations (in-person or virtual) to personalize your PWR! Research scientist (medical).   [place] Fridays:   As part of the PD Health @ Home program, this free video series focuses each week on one aspect of fitness designed to support people living with Parkinson's.? These weekly videos highlight the [place] fitness guidelines for people with Parkinson's disease.  ModemGamers.si   Dance for PD website is offering free, live-stream classes throughout the week, as well as links to [employer] of classes:  [email or web address]  Virtual dance and Pilates for Parkinson's classes: Click on the Community Tab> Parkinson's Movement Initiative Tab.  To register for classes and for more  information, visit www.[email or web address] and click the "community" tab.   [employer] Parkinson's Cycling Classes   [facility]:  Thursdays @ Noon-Live classes at [employer] ([facility] [email or web address]?or [phone])  [facility]: Virtual Classes Mondays and Thursdays Jeanette Caprice classes Tuesday, Wednesday and Thursday (contact [place] at [email or web address] ?or [phone])  [place]  Varied levels of classes are offered Tuesdays and Thursdays at [employer].   Stretching with Verdis Frederickson weekly class is also offered for people with Parkinson's  To observe a class or for more information, call [phone] or email Hezzie Bump at [email or web address]   ADDITIONAL SUPPORT AND RESOURCES  Well[employer]Spring [employer]:Online Caregiver Education Opportunities:  www.well-springsolutions.org/caregiver-education/caregiver-support-group.  You may also contact Vickki Muff at [email or web address] or [phone].     Well-Spring Navigator:  Just1Navigator program, a?free service to help individuals and families through the journey of determining care for older adults.  The "Navigator" is a [phone], Education officer, museum, who will speak with a prospective client and/or loved ones to provide an assessment of the situation and a set of recommendations for a personalized care plan -- all free of charge, and whether?Well-Spring Solutions offers the needed service or not. If the need is not a service we provide, we are well-connected with reputable programs in town that we can refer you to.  www.well-springsolutions.org or to speak with the Navigator, call [phone].

## 2022-11-01 ENCOUNTER — Telehealth (HOSPITAL_COMMUNITY): Payer: Self-pay | Admitting: Radiology

## 2022-11-01 ENCOUNTER — Other Ambulatory Visit (HOSPITAL_COMMUNITY): Payer: Self-pay | Admitting: Interventional Radiology

## 2022-11-01 DIAGNOSIS — N2889 Other specified disorders of kidney and ureter: Secondary | ICD-10-CM

## 2022-11-01 NOTE — Telephone Encounter (Signed)
Called pt, left VM for her to call and schedule left renal mass cryoablation at [facility] with Dr. Ronny Bacon on 11/18/22. JM

## 2022-11-02 ENCOUNTER — Encounter: Payer: Self-pay | Admitting: Cardiovascular Disease

## 2022-11-02 ENCOUNTER — Ambulatory Visit: Payer: Medicare PPO | Attending: Cardiovascular Disease | Admitting: Cardiovascular Disease

## 2022-11-02 ENCOUNTER — Encounter (HOSPITAL_COMMUNITY): Payer: Self-pay

## 2022-11-02 VITALS — BP 138/42 | HR 62 | Ht 66.0 in | Wt 130.0 lb

## 2022-11-02 DIAGNOSIS — E1169 Type 2 diabetes mellitus with other specified complication: Secondary | ICD-10-CM

## 2022-11-02 DIAGNOSIS — E785 Hyperlipidemia, unspecified: Secondary | ICD-10-CM

## 2022-11-02 DIAGNOSIS — I10 Essential (primary) hypertension: Secondary | ICD-10-CM | POA: Diagnosis not present

## 2022-11-02 NOTE — Assessment & Plan Note (Signed)
History of essential hypertension blood pressure measured today at 138/42.  She is on hydrochlorothiazide, losartan.

## 2022-11-02 NOTE — Progress Notes (Signed)
11/02/2022 Pamela Patton   [birth year]  [id number]  Primary Physician Farrel Conners, MD Primary Cardiologist: Lorretta Harp MD Lupe Carney, [place]  HPI:  Pamela Patton is a 77 y.o.  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 I last saw her in the office 08/13/2021. Her risk factor profile is remarkable for diabetes, hypertension and hyperlipidemia. She had a Myoview 04/29/2010 which showed breast attenuation artifact.  She has developed Parkinson's disease in the interim.   Since I saw her a year ago she continues to do well.  Her Parkinson's disease has remained stable.  She denies chest pain or shortness of breath.   Current Meds  Medication Sig   aspirin 81 MG tablet Take 81 mg by mouth daily with lunch.    atorvastatin (LIPITOR) 40 MG tablet Take 40 mg by mouth daily with lunch.    Blood Pressure Monitoring (BLOOD PRESSURE CUFF) MISC 1 Product by Does not apply route as needed.   Calcium Carbonate-Vitamin D 600-400 MG-UNIT tablet Take 1 tablet by mouth 3 (three) times a week.    carbidopa-levodopa (SINEMET IR) 25-100 MG tablet TAKE TWO TABLETS BY MOUTH EVERY MORNING and TAKE TWO TABLETS BY MOUTH AT NOON and TAKE TWO TABLETS BY MOUTH EVERYDAY AT BEDTIME   Cholecalciferol (VITAMIN D PO) Take 5,000 Units by mouth 3 (three) times a week.    Cyanocobalamin (VITAMIN B12 PO) Take 5,000 mcg by mouth 2 (two) times a week.   glucose 4 GM chewable tablet Chew 1 tablet by mouth as needed for low blood sugar.   glucose blood (ACCU-CHEK AVIVA PLUS) test strip Use to test blood sugar 4 times a day (Dx: E11.65)   HUMALOG 100 UNIT/ML injection Inject 3 Units into the skin as directed. Sliding scale   hydrochlorothiazide (HYDRODIURIL) 25 MG tablet Take 25 mg by mouth daily with lunch.    insulin glargine (LANTUS) 100 UNIT/ML injection Inject 0.05 mLs (5 Units total) into the skin every  evening. (Patient taking differently: Inject 5 Units into the skin daily.)   Insulin Syringe-Needle U-100 (INSULIN SYRINGE .3CC/31GX5/16") 31G X 5/16" 0.3 ML MISC Use as directed for Lantus injection once a day   levothyroxine (SYNTHROID, LEVOTHROID) 75 MCG tablet Take 75 mcg by mouth daily.   losartan (COZAAR) 50 MG tablet Take 1 tablet (50 mg total) by mouth daily.   NONFORMULARY OR COMPOUNDED ITEM [facility]:  Antifungal - Terbinafine 3%, Fluconazole 2%, Tea Tree Oil 5%, Urea 10%, Ibuprofen 2% in DMSO Suspension #28m. Apply to the affected nail(s) once (at bedtime) or twice daily.   Nutritional Supplements (FEEDING SUPPLEMENT, GLUCERNA 1.2 CAL,) LIQD Take 237 mLs by mouth daily as needed.   OneTouch Delica Lancets 374YMISC Use to test blood sugar 4 times a day (Dx: E11.65)   Pioglitazone HCl (ACTOS PO) Take 45 mg by mouth daily with lunch.    Polyethyl Glycol-Propyl Glycol (SYSTANE) 0.4-0.3 % GEL ophthalmic gel Place 1 application into both eyes at bedtime as needed (FOR EYES).   Current Facility-Administered Medications for the 11/02/22 encounter (Office Visit) with BLorretta Harp MD  Medication   0.9 %  sodium chloride infusion     No Known Allergies  Social History   Socioeconomic History   Marital status: Single    Spouse name: Not on file   Number of children: 1   Years of education: 244  Highest education level: Professional school degree (e.g., MD, DDS, DVM, JD)  Occupational History   Occupation: retired    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[employer], histology, anatomy and physiology  Tobacco Use   Smoking status: Never   Smokeless tobacco: Never  Vaping Use   Vaping Use: Never used  Substance and Sexual Activity   Alcohol use: No    Alcohol/week: 0.0 standard drinks of alcohol   Drug use: No   Sexual activity: Not on file  Other Topics Concern   Not on file  Social History Narrative   Work: recently retired Firefighter, 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 to 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 Situation:  Lives alone, no family around, good friendsSpiritual Beliefs: christian, very involved with her church - [employer] churchLifestyle: exercising 5 days per week, trying to eat healthy   Caffeine 1/2 cup daily   Social Determinants of Health   Financial Resource Strain: Low Risk  (12/29/2021)   Overall Financial Resource Strain (CARDIA)    Difficulty of Paying Living Expenses: Not hard at all  Food Insecurity: No Food Insecurity (12/29/2021)   Hunger Vital Sign    Worried About Running Out of Food in the Last Year: Never true    [place] in the Last Year: Never true  Transportation Needs: No Transportation Needs (12/29/2021)   PRAPARE - Hydrologist (Medical): No    Lack of Transportation (Non-Medical): No  Physical Activity: Insufficiently Active (12/29/2021)   Exercise Vital Sign    Days of Exercise per Week: 4 days    Minutes of Exercise per Session: 30 min  Stress: No Stress Concern Present (12/29/2021)   [place]    Feeling of Stress : Not at all  Social Connections: Moderately Integrated (12/29/2021)   Social Connection and Isolation Panel [NHANES]    Frequency of Communication with Friends and Family: More than three times a week    Frequency of Social Gatherings with Friends and Family: More than three times a week    Attends Religious Services: More than 4 times per year    Active Member of [employer] or Organizations: Yes    Attends Archivist Meetings: More than 4 times per year    Marital Status: Never married  Intimate Partner Violence: Not At Risk (12/29/2021)   Humiliation, Afraid, Rape, and Kick questionnaire    Fear of Current or Ex-Partner: No    Emotionally Abused: No    Physically Abused: No    Sexually Abused: No     Review of Systems: General: negative for chills, fever, night sweats or weight changes.   Cardiovascular: negative for chest pain, dyspnea on exertion, edema, orthopnea, palpitations, paroxysmal nocturnal dyspnea or shortness of breath Dermatological: negative for rash Respiratory: negative for cough or wheezing Urologic: negative for hematuria Abdominal: negative for nausea, vomiting, diarrhea, bright red blood per rectum, melena, or hematemesis Neurologic: negative for visual changes, syncope, or dizziness All other systems reviewed and are otherwise negative except as noted above.    Blood pressure (!) 138/42, pulse 62, height '5\' 6"'$  (1.676 m), weight 130 lb (59 kg), SpO2 95 %.  General appearance: alert and no distress Neck: no adenopathy, no carotid bruit, no JVD, supple, symmetrical, trachea midline, and thyroid not enlarged, symmetric, no tenderness/mass/nodules Lungs: clear to auscultation bilaterally Heart: regular rate and rhythm, S1, S2 normal, no murmur, click, rub or gallop Extremities: extremities normal, atraumatic, no  cyanosis or edema Pulses: 2+ and symmetric Skin: Skin color, texture, turgor normal. No rashes or lesions Neurologic: Grossly normal  EKG sinus rhythm at 62 without ST or T wave changes.  Personally reviewed this EKG.  ASSESSMENT AND PLAN:   HTN (hypertension) History of essential hypertension blood pressure measured today at 138/42.  She is on hydrochlorothiazide, losartan.  Hyperlipidemia associated with type 2 diabetes mellitus ([place]) History of hyperlipidemia on statin therapy with lipid profile performed 3 weeks ago revealing a total cholesterol of 158, LDL direct of 63 and HDL 71.     Lorretta Harp MD FACP,FACC,FAHA, [facility] 11/02/2022 1:38 PM

## 2022-11-02 NOTE — Patient Instructions (Addendum)

## 2022-11-02 NOTE — Assessment & Plan Note (Signed)
History of hyperlipidemia on statin therapy with lipid profile performed 3 weeks ago revealing a total cholesterol of 158, LDL direct of 63 and HDL 71.

## 2022-11-07 ENCOUNTER — Other Ambulatory Visit: Payer: Self-pay | Admitting: Neurology

## 2022-11-07 DIAGNOSIS — G20A1 Parkinson's disease without dyskinesia, without mention of fluctuations: Secondary | ICD-10-CM

## 2022-11-16 ENCOUNTER — Encounter (HOSPITAL_COMMUNITY): Payer: Self-pay | Admitting: Interventional Radiology

## 2022-11-16 NOTE — Progress Notes (Signed)
PCP - Dr [name]  Cardiologist - Dr Quay Burow Endocrinology - Dr Daine Floras Neurology - Wells Guiles [name], DO  Chest x-ray - n/a EKG - 11/02/22 Stress Test - 04/29/10 ECHO - 04/19/10 Cardiac Cath - n/a  ICD Pacemaker/Loop - n/a  Sleep Study -  n/a CPAP - none  Diabetes Type 2 THE NIGHT BEFORE SURGERY, take 2 units of Lantus insulin.      THE MORNING OF SURGERY, do not take Novolog Insulin unless your CBG is greater than 220 mg/dL.  If your CBG is greater than 225m/dL,  you may take  of your sliding scale (correction) dose of insulin.  If your blood sugar is less than 70 mg/dL, you will need to treat for low blood sugar:. Treat a low blood sugar (less than 70 mg/dL) with  cup of clear juice (cranberry or apple), 4 glucose tablets, OR glucose gel. Recheck blood sugar in 15 minutes after treatment (to make sure it is greater than 70 mg/dL). If your blood sugar is not greater than 70 mg/dL on recheck, call [phone] further instructions.  Anesthesia review: Yes  STOP now taking any Aspirin (unless otherwise instructed by your surgeon), Aleve, Naproxen, Ibuprofen, Motrin, Advil, Goody's, BC's, all herbal medications, fish oil, and all vitamins.   Coronavirus Screening Do you have any of the following symptoms:  Cough yes/no: No Fever (>100.70F)  yes/no: No Runny nose yes/no: No Sore throat yes/no: No Difficulty breathing/shortness of breath  yes/no: No  Have you traveled in the last 14 days and where? yes/no: No  Patient verbalized understanding of instructions that were given via phone.

## 2022-11-17 ENCOUNTER — Encounter (HOSPITAL_COMMUNITY): Payer: Self-pay | Admitting: Interventional Radiology

## 2022-11-17 ENCOUNTER — Other Ambulatory Visit: Payer: Self-pay | Admitting: Student

## 2022-11-17 ENCOUNTER — Other Ambulatory Visit: Payer: Self-pay | Admitting: Radiology

## 2022-11-17 DIAGNOSIS — N2889 Other specified disorders of kidney and ureter: Secondary | ICD-10-CM

## 2022-11-17 NOTE — Progress Notes (Signed)
Anesthesia Chart Review: Same day workup  77 year old female with medical history significant for CKD stage III (baseline creatinine ~1.4), insulin dependent DM2, HTN, HLD, hypothyroidism, Parkinson disease.  She is followed by cardiologist Dr. Gwenlyn Found for history of hypertension hyperlipidemia.  She was last seen 11/02/2022.  She was noted to be doing well at that time, stable from cardiac standpoint, no changes to management, 1 year follow-up recommended.  Follows with [facility]ocrinology at [facility] for management of DM2 and hypothyroid.  Last seen 10/07/2022, A1c at that time 8.0, insulin was titrated.  She was continued on current doses of Synthroid and pioglitazone.  Patient is disease managed by neurologist Dr. Carles Collet.  Last seen 10/24/2022, she was continued on current dose of carbidopa/levodopa.  She will need to have surgery labs and evaluation.  EKG 11/02/2022: NSR.  Rate Screven, PA-C [facility] Short Stay Center/Anesthesiology Phone [phone] 11/17/2022 9:47 AM

## 2022-11-17 NOTE — Anesthesia Preprocedure Evaluation (Addendum)
Anesthesia Evaluation  Patient identified by MRN, date of birth, ID band Patient awake    Reviewed: Allergy & Precautions, NPO status , Patient's Chart, lab work & pertinent test results  History of Anesthesia Complications Negative for: history of anesthetic complications  Airway Mallampati: IV  TM Distance: >3 FB Neck ROM: Limited  Mouth opening: Limited Mouth Opening  Dental  (+) Dental Advisory Given, Teeth Intact   Pulmonary neg shortness of breath, neg sleep apnea, neg COPD, neg recent URI   breath sounds clear to auscultation       Cardiovascular hypertension, Pt. on medications (-) angina (-) Past MI and (-) CHF  Rhythm:Regular  - Left ventricle: The cavity size was normal. Wall thickness was     increased in a pattern of mild LVH. Systolic function was normal.     The estimated ejection fraction was in the range of 55% to 60%.     Wall motion was normal; there were no regional wall motion     abnormalities. Left ventricular diastolic function parameters were     normal. The E/e' is 106/8 (>10), suggesting mildly increased LV     filling pressure.   - Left atrium: The atrium was mildly dilated     Neuro/Psych neg Seizures Parkinson's  Neuromuscular disease  negative psych ROS   GI/Hepatic Neg liver ROS,GERD  ,,  Endo/Other  diabetesHypothyroidism  Lab Results      Component                Value               Date                      HGBA1C                   8.1 (H)             09/11/2018             Renal/GU Renal InsufficiencyRenal diseaseLeft renal mass  Lab Results      Component                Value               Date                      CREATININE               1.24 (H)            11/18/2022           Lab Results      Component                Value               Date                      K                        4.3                 11/18/2022                Musculoskeletal  (+) Arthritis ,     Abdominal   Peds  Hematology  (+) Blood dyscrasia, anemia Lab Results      Component  Value               Date                      WBC                      4.7                 11/18/2022                HGB                      9.2 (L)             11/18/2022                HCT                      30.1 (L)            11/18/2022                MCV                      97.1                11/18/2022                PLT                      202                 11/18/2022              Anesthesia Other Findings   Reproductive/Obstetrics                             Anesthesia Physical Anesthesia Plan  ASA: 3  Anesthesia Plan: General   Post-op Pain Management: Minimal or no pain anticipated   Induction: Intravenous  PONV Risk Score and Plan: 3 and Ondansetron, Dexamethasone, Propofol infusion and TIVA  Airway Management Planned:   Additional Equipment: None  Intra-op Plan:   Post-operative Plan: Extubation in OR  Informed Consent: I have reviewed the patients History and Physical, chart, labs and discussed the procedure including the risks, benefits and alternatives for the proposed anesthesia with the patient or authorized representative who has indicated his/her understanding and acceptance.     Dental advisory given  Plan Discussed with: CRNA  Anesthesia Plan Comments: (PAT note by Karoline Caldwell, PA-C: 77 year old female with medical history significant for CKD stage III (baseline creatinine ~1.4), insulin dependent DM2, HTN, HLD, hypothyroidism, Parkinson disease.  She is followed by cardiologist Dr. Gwenlyn Found for history of hypertension hyperlipidemia.  She was last seen 11/02/2022.  She was noted to be doing well at that time, stable from cardiac standpoint, no changes to management, 1 year follow-up recommended.  Follows with endocrinology at [facility] for management of DM2 and hypothyroid.  Last seen 10/07/2022, A1c at that time 8.0,  insulin was titrated.  She was continued on current doses of Synthroid and pioglitazone.  Patient is disease managed by neurologist Dr. Carles Collet.  Last seen 10/24/2022, she was continued on current dose of carbidopa/levodopa.  She will need to have surgery labs and evaluation.  EKG 11/02/2022: NSR.  Rate 62.  )  Anesthesia Quick Evaluation  

## 2022-11-18 ENCOUNTER — Ambulatory Visit (HOSPITAL_BASED_OUTPATIENT_CLINIC_OR_DEPARTMENT_OTHER): Payer: Medicare PPO | Admitting: Physician Assistant

## 2022-11-18 ENCOUNTER — Encounter (HOSPITAL_COMMUNITY): Payer: Self-pay | Admitting: Interventional Radiology

## 2022-11-18 ENCOUNTER — Observation Stay (HOSPITAL_COMMUNITY)
Admission: AD | Admit: 2022-11-18 | Discharge: 2022-11-19 | Disposition: A | Discharge status: Home / Self Care | Payer: Medicare PPO | Attending: Interventional Radiology | Admitting: Interventional Radiology

## 2022-11-18 ENCOUNTER — Observation Stay (HOSPITAL_COMMUNITY)
Admission: RE | Admit: 2022-11-18 | Discharge: 2022-11-18 | Disposition: A | Discharge status: Home / Self Care | Payer: Medicare PPO | Source: Ambulatory Visit | Attending: Interventional Radiology | Admitting: Interventional Radiology

## 2022-11-18 ENCOUNTER — Ambulatory Visit (HOSPITAL_COMMUNITY): Payer: Medicare PPO | Admitting: Physician Assistant

## 2022-11-18 ENCOUNTER — Encounter (HOSPITAL_COMMUNITY)
Admission: AD | Disposition: A | Discharge status: Home / Self Care | Payer: Self-pay | Source: Home / Self Care | Attending: Interventional Radiology

## 2022-11-18 DIAGNOSIS — Z7982 Long term (current) use of aspirin: Secondary | ICD-10-CM | POA: Diagnosis not present

## 2022-11-18 DIAGNOSIS — Z79899 Other long term (current) drug therapy: Secondary | ICD-10-CM | POA: Diagnosis not present

## 2022-11-18 DIAGNOSIS — I10 Essential (primary) hypertension: Secondary | ICD-10-CM | POA: Diagnosis not present

## 2022-11-18 DIAGNOSIS — E039 Hypothyroidism, unspecified: Secondary | ICD-10-CM | POA: Insufficient documentation

## 2022-11-18 DIAGNOSIS — G20C Parkinsonism, unspecified: Secondary | ICD-10-CM | POA: Diagnosis not present

## 2022-11-18 DIAGNOSIS — C642 Malignant neoplasm of left kidney, except renal pelvis: Secondary | ICD-10-CM | POA: Diagnosis not present

## 2022-11-18 DIAGNOSIS — N2889 Other specified disorders of kidney and ureter: Secondary | ICD-10-CM

## 2022-11-18 DIAGNOSIS — I129 Hypertensive chronic kidney disease with stage 1 through stage 4 chronic kidney disease, or unspecified chronic kidney disease: Secondary | ICD-10-CM | POA: Diagnosis not present

## 2022-11-18 DIAGNOSIS — E1149 Type 2 diabetes mellitus with other diabetic neurological complication: Secondary | ICD-10-CM

## 2022-11-18 DIAGNOSIS — N183 Chronic kidney disease, stage 3 unspecified: Secondary | ICD-10-CM | POA: Insufficient documentation

## 2022-11-18 DIAGNOSIS — C649 Malignant neoplasm of unspecified kidney, except renal pelvis: Principal | ICD-10-CM | POA: Diagnosis present

## 2022-11-18 DIAGNOSIS — Z794 Long term (current) use of insulin: Secondary | ICD-10-CM | POA: Insufficient documentation

## 2022-11-18 DIAGNOSIS — D638 Anemia in other chronic diseases classified elsewhere: Secondary | ICD-10-CM

## 2022-11-18 HISTORY — PX: RADIOLOGY WITH ANESTHESIA: SHX6223

## 2022-11-18 LAB — GLUCOSE, CAPILLARY
Glucose-Capillary: 133 mg/dL — ABNORMAL HIGH (ref 70–99)
Glucose-Capillary: 187 mg/dL — ABNORMAL HIGH (ref 70–99)
Glucose-Capillary: 213 mg/dL — ABNORMAL HIGH (ref 70–99)
Glucose-Capillary: 225 mg/dL — ABNORMAL HIGH (ref 70–99)
Glucose-Capillary: 239 mg/dL — ABNORMAL HIGH (ref 70–99)

## 2022-11-18 LAB — PROTIME-INR
INR: 1.3 — ABNORMAL HIGH (ref 0.8–1.2)
Prothrombin Time: 15.7 seconds — ABNORMAL HIGH (ref 11.4–15.2)

## 2022-11-18 LAB — CBC
HCT: 30.1 % — ABNORMAL LOW (ref 36.0–46.0)
Hemoglobin: 9.2 g/dL — ABNORMAL LOW (ref 12.0–15.0)
MCH: 29.7 pg (ref 26.0–34.0)
MCHC: 30.6 g/dL (ref 30.0–36.0)
MCV: 97.1 fL (ref 80.0–100.0)
Platelets: 202 10*3/uL (ref 150–400)
RBC: 3.1 MIL/uL — ABNORMAL LOW (ref 3.87–5.11)
RDW: 15.7 % — ABNORMAL HIGH (ref 11.5–15.5)
WBC: 4.7 10*3/uL (ref 4.0–10.5)
nRBC: 0 % (ref 0.0–0.2)

## 2022-11-18 LAB — BASIC METABOLIC PANEL
Anion gap: 11 (ref 5–15)
BUN: 22 mg/dL (ref 8–23)
CO2: 24 mmol/L (ref 22–32)
Calcium: 9.2 mg/dL (ref 8.9–10.3)
Chloride: 106 mmol/L (ref 98–111)
Creatinine, Ser: 1.24 mg/dL — ABNORMAL HIGH (ref 0.44–1.00)
GFR, Estimated: 45 mL/min — ABNORMAL LOW (ref >60)
Glucose, Bld: 147 mg/dL — ABNORMAL HIGH (ref 70–99)
Potassium: 4.3 mmol/L (ref 3.5–5.1)
Sodium: 141 mmol/L (ref 135–145)

## 2022-11-18 SURGERY — CT WITH ANESTHESIA
Anesthesia: General | Laterality: Left

## 2022-11-18 MED ORDER — SODIUM CHLORIDE 0.9 % IV SOLN: INTRAVENOUS | Status: DC

## 2022-11-18 MED ORDER — DEXAMETHASONE SODIUM PHOSPHATE 10 MG/ML IJ SOLN
INTRAMUSCULAR | Status: DC | PRN
  Administered 2022-11-18: 5 mg via INTRAVENOUS

## 2022-11-18 MED ORDER — ACETAMINOPHEN 160 MG/5ML PO SOLN: 1000.0000 mg | Freq: Once | ORAL | Status: DC | PRN

## 2022-11-18 MED ORDER — ONDANSETRON HCL 4 MG/2ML IJ SOLN
INTRAMUSCULAR | Status: DC | PRN
  Administered 2022-11-18: 4 mg via INTRAVENOUS

## 2022-11-18 MED ORDER — LIDOCAINE-EPINEPHRINE 0.5 %-1:200000 IJ SOLN
20.0000 mL | Freq: Once | INTRAMUSCULAR | Status: DC
  Filled 2022-11-18 (×2): qty 20

## 2022-11-18 MED ORDER — FENTANYL CITRATE (PF) 100 MCG/2ML IJ SOLN
INTRAMUSCULAR | Status: DC | PRN
  Administered 2022-11-18: 50 ug via INTRAVENOUS

## 2022-11-18 MED ORDER — CIPROFLOXACIN IN D5W 400 MG/200ML IV SOLN
400.0000 mg | Freq: Once | INTRAVENOUS | Status: AC
  Administered 2022-11-18: 400 mg via INTRAVENOUS
  Filled 2022-11-18: qty 200

## 2022-11-18 MED ORDER — INSULIN ASPART 100 UNIT/ML FLEXPEN
3.0000 [IU] | PEN_INJECTOR | Freq: Three times a day (TID) | SUBCUTANEOUS | Status: DC
  Administered 2022-11-18: 3 [IU] via SUBCUTANEOUS
  Filled 2022-11-18: qty 3

## 2022-11-18 MED ORDER — LIDOCAINE 2% (20 MG/ML) 5 ML SYRINGE
INTRAMUSCULAR | Status: DC | PRN
  Administered 2022-11-18: 40 mg via INTRAVENOUS

## 2022-11-18 MED ORDER — GLUCOSE 4 G PO CHEW: 1.0000 | CHEWABLE_TABLET | ORAL | Status: DC | PRN

## 2022-11-18 MED ORDER — PHENYLEPHRINE 80 MCG/ML (10ML) SYRINGE FOR IV PUSH (FOR BLOOD PRESSURE SUPPORT)
PREFILLED_SYRINGE | INTRAVENOUS | Status: DC | PRN
  Administered 2022-11-18 (×3): 80 ug via INTRAVENOUS
  Administered 2022-11-18 (×2): 40 ug via INTRAVENOUS

## 2022-11-18 MED ORDER — SODIUM CHLORIDE 0.9% FLUSH: 9.0000 mL | INTRAVENOUS | Status: DC | PRN

## 2022-11-18 MED ORDER — HYDROCHLOROTHIAZIDE 25 MG PO TABS
25.0000 mg | ORAL_TABLET | Freq: Every day | ORAL | Status: DC
  Administered 2022-11-18: 25 mg via ORAL
  Filled 2022-11-18: qty 1

## 2022-11-18 MED ORDER — CHLORHEXIDINE GLUCONATE 0.12 % MT SOLN
15.0000 mL | Freq: Once | OROMUCOSAL | Status: AC
  Administered 2022-11-18: 15 mL via OROMUCOSAL
  Filled 2022-11-18: qty 15

## 2022-11-18 MED ORDER — EPHEDRINE SULFATE-NACL 50-0.9 MG/10ML-% IV SOSY
PREFILLED_SYRINGE | INTRAVENOUS | Status: DC | PRN
  Administered 2022-11-18 (×3): 5 mg via INTRAVENOUS
  Administered 2022-11-18: 10 mg via INTRAVENOUS

## 2022-11-18 MED ORDER — DIPHENHYDRAMINE HCL 50 MG/ML IJ SOLN: 12.5000 mg | Freq: Four times a day (QID) | INTRAMUSCULAR | Status: DC | PRN

## 2022-11-18 MED ORDER — NALOXONE HCL 0.4 MG/ML IJ SOLN: 0.4000 mg | INTRAMUSCULAR | Status: DC | PRN

## 2022-11-18 MED ORDER — ORAL CARE MOUTH RINSE: 15.0000 mL | Freq: Once | OROMUCOSAL | Status: AC

## 2022-11-18 MED ORDER — ATORVASTATIN CALCIUM 40 MG PO TABS
40.0000 mg | ORAL_TABLET | Freq: Every day | ORAL | Status: DC
  Administered 2022-11-18: 40 mg via ORAL
  Filled 2022-11-18: qty 1

## 2022-11-18 MED ORDER — ROCURONIUM BROMIDE 10 MG/ML (PF) SYRINGE
PREFILLED_SYRINGE | INTRAVENOUS | Status: DC | PRN
  Administered 2022-11-18: 40 mg via INTRAVENOUS

## 2022-11-18 MED ORDER — ONDANSETRON HCL 4 MG/2ML IJ SOLN: 4.0000 mg | Freq: Four times a day (QID) | INTRAMUSCULAR | Status: DC | PRN

## 2022-11-18 MED ORDER — PIOGLITAZONE HCL 30 MG PO TABS
45.0000 mg | ORAL_TABLET | Freq: Every day | ORAL | Status: DC
  Administered 2022-11-18: 45 mg via ORAL
  Filled 2022-11-18 (×2): qty 1

## 2022-11-18 MED ORDER — LOSARTAN POTASSIUM 50 MG PO TABS
50.0000 mg | ORAL_TABLET | Freq: Every day | ORAL | Status: DC
  Administered 2022-11-18: 50 mg via ORAL
  Filled 2022-11-18: qty 1

## 2022-11-18 MED ORDER — PROPOFOL 10 MG/ML IV BOLUS
INTRAVENOUS | Status: DC | PRN
  Administered 2022-11-18: 20 mg via INTRAVENOUS
  Administered 2022-11-18: 90 mg via INTRAVENOUS

## 2022-11-18 MED ORDER — FENTANYL CITRATE (PF) 100 MCG/2ML IJ SOLN: 25.0000 ug | INTRAMUSCULAR | Status: DC | PRN

## 2022-11-18 MED ORDER — LACTATED RINGERS IV SOLN: INTRAVENOUS | Status: DC

## 2022-11-18 MED ORDER — PHENYLEPHRINE HCL-NACL 20-0.9 MG/250ML-% IV SOLN
INTRAVENOUS | Status: DC | PRN
  Administered 2022-11-18: 15 ug/min via INTRAVENOUS

## 2022-11-18 MED ORDER — FENTANYL 50 MCG/ML IV PCA SOLN
INTRAVENOUS | Status: DC
  Filled 2022-11-18: qty 25

## 2022-11-18 MED ORDER — INSULIN GLARGINE-YFGN 100 UNIT/ML ~~LOC~~ SOLN
5.0000 [IU] | Freq: Every day | SUBCUTANEOUS | Status: DC
  Administered 2022-11-18: 5 [IU] via SUBCUTANEOUS
  Filled 2022-11-18 (×2): qty 0.05

## 2022-11-18 MED ORDER — CARBIDOPA-LEVODOPA 25-100 MG PO TABS
2.0000 | ORAL_TABLET | Freq: Three times a day (TID) | ORAL | Status: DC
  Administered 2022-11-18 – 2022-11-19 (×3): 2 via ORAL
  Filled 2022-11-18 (×3): qty 2

## 2022-11-18 MED ORDER — ACETAMINOPHEN 500 MG PO TABS: 1000.0000 mg | ORAL_TABLET | Freq: Once | ORAL | Status: DC | PRN

## 2022-11-18 MED ORDER — LEVOTHYROXINE SODIUM 75 MCG PO TABS
75.0000 ug | ORAL_TABLET | Freq: Every day | ORAL | Status: DC
  Administered 2022-11-19: 75 ug via ORAL
  Filled 2022-11-18: qty 1

## 2022-11-18 MED ORDER — DIPHENHYDRAMINE HCL 12.5 MG/5ML PO ELIX: 12.5000 mg | ORAL_SOLUTION | Freq: Four times a day (QID) | ORAL | Status: DC | PRN

## 2022-11-18 MED ORDER — SUGAMMADEX SODIUM 200 MG/2ML IV SOLN
INTRAVENOUS | Status: DC | PRN
  Administered 2022-11-18: 150 mg via INTRAVENOUS

## 2022-11-18 MED ORDER — ACETAMINOPHEN 10 MG/ML IV SOLN: 1000.0000 mg | Freq: Once | INTRAVENOUS | Status: DC | PRN

## 2022-11-18 NOTE — H&P (Signed)
Chief Complaint: Patient was seen in consultation today for  at the request of Louis Meckel  Referring Physician(s): Ardis Hughs  Supervising Physician: Sandi Mariscal  Patient Status: [facility] - Out-pt  History of Present Illness: Pamela Patton is a 77 y.o. female with complex past medical history significant for stage III chronic kidney disease, hypertension, hyperlipidemia, hypothyroidism and Parkinson disease.  She had televisit consult visit with Dr. Pascal Lux on 10/21/22.  She was diagnosed with left RCC in 2017 and has been undergone surveillance imaging since.  Due to growth of the renal lesion she was referred to IR for percutaneous management.  The patient presents and reports remaining asymptomatic in regards to this lesion.  She denies no unintentional weight loss/gain. No hematuria, No flank pain.  Additionally, she denies HA, blurred vision, light headedness, dizziness, difficulty swallowing, chest pain, SOB, palpitations, abdominal pain, change in bowel or bladder habits, or difficulty with ambulation.  She mentions lower extremity swelling, most noticeable in the evenings.    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   Carotid artery disease 03/21/2017   Cataract    surgery to remove   Chest pain summer 2012   Chicken pox    CKD (chronic kidney disease) 10/08/2012   Stage 3, GFR 30-59 ml/min   Diverticulosis    difficulty with colonoscopy 2005   DM (diabetes mellitus), type 2 with renal complications 99991111   Fracture    radial/ulnar 03/2016   GERD (gastroesophageal reflux disease)    Heart murmur    innocent murmur   High cholesterol    Hypertension associated with diabetes 02/22/2017   Hyperuricemia 10/31/2016   Hypothyroidism    s/p radioactive iodine tx for graves in 2001   Intestinal metaplasia of gastric mucosa    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   Neuromuscular disorder ([place])  08/08/2016   Parkinson's   UTI (urinary tract infection)    Vitamin D deficiency 08/24/2012   Weight loss    eval with GI in 2016; s/p CT/MRI and PET scans    Past Surgical History:  Procedure Laterality Date   ABDOMINAL HYSTERECTOMY  1989   CATARACT EXTRACTION, BILATERAL     COLONOSCOPY  07/28/2016   x several, last one in 2017   DG BARIUM SWALLOW ([place] HX)     9 yrs ago per pt due to incomplete colonsocopy   UPPER GI ENDOSCOPY  07/28/2016    Allergies: Patient has no known allergies.  Medications: Prior to Admission medications   Medication Sig Start Date End Date Taking? Authorizing Provider  aspirin 81 MG tablet Take 81 mg by mouth at bedtime.   Yes [provider]  atorvastatin (LIPITOR) 40 MG tablet Take 40 mg by mouth at bedtime.   Yes [provider]  Calcium Carbonate-Vitamin D 600-400 MG-UNIT tablet Take 1 tablet by mouth 3 (three) times a week.    Yes [provider]  carbidopa-levodopa (SINEMET IR) 25-100 MG tablet TAKE TWO TABLETS BY MOUTH EVERY MORNING and TAKE TWO TABLETS BY MOUTH AT NOON and TAKE TWO TABLETS BY MOUTH EVERYDAY AT BEDTIME 11/07/22  Yes [name], Eustace Quail, DO  Cholecalciferol (VITAMIN D PO) Take 5,000 Units by mouth 3 (three) times a week.    Yes [provider]  Cyanocobalamin (VITAMIN B12 PO) Take 5,000 mcg by mouth 2 (two) times a week.   Yes [provider]  glucose 4 GM  chewable tablet Chew 1 tablet by mouth as needed for low blood sugar.   Yes [provider]  glucose blood (ACCU-CHEK AVIVA PLUS) test strip Use to test blood sugar 4 times a day (Dx: E11.65) 12/25/19  Yes [provider]  hydrochlorothiazide (HYDRODIURIL) 25 MG tablet Take 25 mg by mouth at bedtime. 06/29/12  Yes [provider]  insulin glargine (LANTUS) 100 UNIT/ML injection Inject 0.05 mLs (5 Units total) into the skin every evening. Patient taking differently: Inject 5 Units into the skin at bedtime. 11/01/21  Yes  Koberlein, Junell C, MD  levothyroxine (SYNTHROID, LEVOTHROID) 75 MCG tablet Take 75 mcg by mouth daily before breakfast.   Yes [provider]  losartan (COZAAR) 50 MG tablet Take 1 tablet (50 mg total) by mouth daily. Patient taking differently: Take 50 mg by mouth at bedtime. 12/18/21  Yes Caren Macadam, MD  NONFORMULARY OR COMPOUNDED ITEM [facility]:  Antifungal - Terbinafine 3%, Fluconazole 2%, Tea Tree Oil 5%, Urea 10%, Ibuprofen 2% in DMSO Suspension #68m. Apply to the affected nail(s) once (at bedtime) or twice daily. Patient taking differently: Apply 1 application  topically as needed (Toe nail fungus). [facility]:  Antifungal - Terbinafine 3%, Fluconazole 2%, Tea Tree Oil 5%, Urea 10%, Ibuprofen 2% in DMSO Suspension #380m Apply to the affected nail(s) as needed for fungus 10/31/18  Yes [name], [name], [name]  NOVOLOG FLEXPEN 100 UNIT/ML FlexPen Inject 3 Units into the skin 3 (three) times daily with meals. Sliding scale 101-120=1 unit 121-150=2 units 150-200=3 units 201-250= 4 units 251-300=5 units Over 300= units 11/09/22  Yes [provider]  Nutritional Supplements (FEEDING SUPPLEMENT, GLUCERNA 1.2 CAL,) LIQD Take 237 mLs by mouth daily as needed (Supplement).   Yes [provider]  Pioglitazone HCl (ACTOS PO) Take 45 mg by mouth at bedtime.   Yes [provider]  Polyethyl Glycol-Propyl Glycol (SYSTANE) 0.4-0.3 % GEL ophthalmic gel Place 1 application into both eyes at bedtime as needed (FOR EYES).   Yes [provider]  Blood Pressure Monitoring (BLOOD PRESSURE CUFF) MISC 1 Product by Does not apply route as needed. 05/21/19   KiLucretia KernDO  Insulin Syringe-Needle U-100 (INSULIN SYRINGE .3CC/31GX5/16") 31G X 5/16" 0.3 ML MISC Use as directed for Lantus injection once a day 12/25/19   [provider]  OneTouch Delica Lancets [id number] Use to test blood sugar 4 times a day (Dx: E11.65) 12/25/19   [provider]     Family History  Problem Relation Age of Onset   Cancer Mother        liver   Liver cancer Mother    Stroke Father [id number] Diabetes Father    Hypertension Father    Diabetes Maternal Grandmother    Prostate cancer Maternal Grandfather    Pancreatic cancer Brother    Diabetes Sister    Cancer Sister    Diabetes Sister    Colon polyps Neg Hx    Colon cancer Neg Hx    Esophageal cancer Neg Hx    Rectal cancer Neg Hx    Stomach cancer Neg Hx     Review of Systems: A 12 point ROS discussed and pertinent positives are indicated in the HPI above.  All other systems are negative.  Vital Signs: BP 138/66   Pulse 60   Temp 98.9 F (37.2 C) (Oral)   Resp 16   Ht 5' 6"$  (1.676 m)   Wt 130 lb 11.7  oz (59.3 kg)   SpO2 100%   BMI 21.10 kg/m   Advance Care Plan: Full code  Physical Exam Vitals reviewed.  Constitutional:      General: She is not in acute distress. HENT:     Head: Normocephalic and atraumatic.  Cardiovascular:     Rate and Rhythm: Normal rate.     Pulses: Normal pulses.  Pulmonary:     Effort: Pulmonary effort is normal. No respiratory distress.     Breath sounds: Normal breath sounds.  Abdominal:     General: There is no distension.     Palpations: Abdomen is soft.  Skin:    General: Skin is warm and dry.  Neurological:     General: No focal deficit present.     Mental Status: She is alert and oriented to person, place, and time.     Comments: Speech is slowed  Psychiatric:        Mood and Affect: Mood normal.        Behavior: Behavior normal.    Imaging: No results found.  Labs:  CBC: Recent Labs    05/26/22 0000 11/18/22 0750  WBC  --  4.7  HGB 9.5* 9.2*  HCT  --  30.1*  PLT  --  202    COAGS: Recent Labs    11/18/22 0750  INR 1.3*    BMP: Recent Labs    11/18/22 0750  NA 141  K 4.3  CL 106  CO2 24  GLUCOSE 147*  BUN 22  CALCIUM 9.2  CREATININE 1.24*  GFRNONAA 45*    LIVER FUNCTION TESTS: No  results for input(s): "BILITOT", "AST", "ALT", "ALKPHOS", "PROT", "ALBUMIN" in the last 8760 hours.  TUMOR MARKERS: No results for input(s): "AFPTM", "CEA", "CA199", "CHROMGRNA" in the last 8760 hours.  Assessment and Plan:  Renal Cell Carcinoma, left -asymptomatic but growing in size -referred to IR for percutaneous treatment, consulted with Dr. Pascal Lux who determined her an appropriate candidate for proceeding -imaging, vitals, meds, labs, and history reviewed -anticipate proceeding with left sided renal cryoablation with general anesthesia, renal biopsy, and overnight observation. -son and patient are in agreement to proceed  Risks and benefits of image guided renal cryoablation was discussed with the patient including, but not limited to, failure to treat entire lesion, bleeding, infection, damage to adjacent structures, hematuria, urine leak, decrease in renal function or post procedural neuropathy.  All of the patient's questions were answered and the patient is agreeable to proceed. Consent signed and in chart.   Thank you for this interesting consult.  I greatly enjoyed meeting Pamela Patton and look forward to participating in their care.  A copy of this report was sent to the requesting provider on this date.  Electronically Signed: Pasty Spillers, PA 11/18/2022, 8:17 AM   I spent a total of 25 Minutes in face to face in clinical consultation, greater than 50% of which was counseling/coordinating care for left RCC.

## 2022-11-18 NOTE — Transfer of Care (Signed)
Immediate Anesthesia Transfer of Care Note  Patient: ABAGALE MALECKI  Procedure(s) Performed: Left Renal Cryoablation (Left)  Patient Location: PACU  Anesthesia Type:General  Level of Consciousness: drowsy and patient cooperative  Airway & Oxygen Therapy: Patient Spontanous Breathing and Patient connected to nasal cannula oxygen  Post-op Assessment: Report given to RN and Post -op Vital signs reviewed and stable  Post vital signs: Reviewed and stable  Last Vitals:  Vitals Value Taken Time  BP 138/55 11/18/22 1234  Temp    Pulse 60 11/18/22 1234  Resp 15 11/18/22 1234  SpO2 100 % 11/18/22 1234  Vitals shown include unvalidated device data.  Last Pain:  Vitals:   11/18/22 0634  TempSrc: Oral         Complications: No notable events documented.

## 2022-11-18 NOTE — Care Management CC44 (Signed)
Condition Code 44 Documentation Completed  Patient Details  Name: Pamela Patton MRN: [id number] Date of Birth: 12-15-1945   Condition Code 44 given:  Yes Patient signature on Condition Code 44 notice:  Yes Documentation of 2 MD's agreement:  Yes Code 44 added to claim:  Yes    Marilu Favre, RN 11/18/2022, 4:35 PM

## 2022-11-18 NOTE — Procedures (Signed)
Pre procedural Dx: Left sided renal mass Post procedural Dx: Same  Technically successful CT and US guided cryoablation of left sided renal mass.   EBL: Minimal Complications: None immediate.   Ronny Bacon, MD Pager #: [phone]

## 2022-11-18 NOTE — Care Management Obs Status (Signed)
MEDICARE OBSERVATION STATUS NOTIFICATION   Patient Details  Name: SHNIYA ANCEL MRN: [id number] Date of Birth: [birth year]   Medicare Observation Status Notification Given:  Yes  Discussed with patient and son. Provided hard copy and emialed a copy to [email or web address] at son request   Marilu Favre, RN 11/18/2022, 4:08 PM

## 2022-11-18 NOTE — Progress Notes (Signed)
Pt arrived on the unit and was talking and comfortable, pt set up and asked for a Ginger ale to drink and I got it for her. About 20 minutes later David,RN went and rounded on the patient and let me know the pt was hard to arouse even with sternal rub, was just gurgling. We both went into the room and the patient was alert and speaking a little gurgled but she stated she felt she had mucous in her throat. RN paged Dr. Pascal Lux waiting for response so I can update him and get further instructions. Pt stated that she was not in pain not sure she needs the Fentanyl PCA but will ask. RN called IR and spoke with Jamie,NP because Dr. Pascal Lux was in a case and briefed her on what happened and she stated to keep an eye on the patient it may be effects from the PCA and that she may not be in pain now because of the numbing medication but to set it up and just watch how she does.

## 2022-11-18 NOTE — Anesthesia Procedure Notes (Addendum)
Procedure Name: Intubation Date/Time: 11/18/2022 9:00 AM  Performed by: Colin Benton, CRNAPre-anesthesia Checklist: Patient identified, Emergency Drugs available, Suction available and Patient being monitored Patient Re-evaluated:Patient Re-evaluated prior to induction Oxygen Delivery Method: Circle system utilized Preoxygenation: Pre-oxygenation with 100% oxygen Induction Type: IV induction Ventilation: Mask ventilation without difficulty Laryngoscope Size: 2 and Miller Grade View: Grade II Tube type: Oral Tube size: 7.0 mm Number of attempts: 2 Airway Equipment and Method: Stylet Placement Confirmation: ETT inserted through vocal cords under direct vision, positive ETCO2 and breath sounds checked- equal and bilateral Secured at: 22 cm Tube secured with: Tape Dental Injury: Teeth and Oropharynx as per pre-operative assessment  Comments: DL x 1 by CRNA with Mil 2.  Grade 3 view.  DL x 2 by Dr. Ermalene Postin with grade 2 b view. EBBS and VSS.

## 2022-11-19 DIAGNOSIS — C642 Malignant neoplasm of left kidney, except renal pelvis: Secondary | ICD-10-CM | POA: Diagnosis not present

## 2022-11-19 MED ORDER — ASPIRIN 81 MG PO TABS: 81.0000 mg | ORAL_TABLET | Freq: Every day | ORAL | 0 refills | Status: AC

## 2022-11-19 NOTE — Progress Notes (Signed)
Patient has yellow MEWS,  low blood pressure. MEWS escalated,provider informed,will continue to monitor.

## 2022-11-19 NOTE — Progress Notes (Signed)
Patient walked 50 feet with assistance,felt weak walking back to her room,will continue to monitor.

## 2022-11-19 NOTE — Progress Notes (Signed)
Remainder of fentanyl PCA (81m) wasted in pyxis with CMaryann Conners RN

## 2022-11-19 NOTE — TOC Transition Note (Signed)
Transition of Care [facility]) - CM/SW Discharge Note   Patient Details  Name: Pamela Patton MRN: [id number] Date of Birth: 1946-07-08  [facility] CM/SW Contact:  Tom-Johnson, Renea Ee, RN Phone Number: 11/19/2022, 1:36 PM   Clinical Narrative:     Patient is scheduled for discharge today. No TOC needs or recommendations noted. Family to transport at discharge. No further TOC needs noted.        Final next level of care: Home/Self Care Barriers to Discharge: Barriers Resolved   Patient Goals and CMS Choice CMS Medicare.gov Compare Post Acute Care list provided to:: Patient Choice offered to / list presented to : NA  Discharge Placement                  Patient to be transferred to facility by: Family      Discharge Plan and Services Additional resources added to the After Visit Summary for                  DME Arranged: N/A DME Agency: NA       HH Arranged: NA HH Agency: NA        Social Determinants of Health (SDOH) Interventions SDOH Screenings   Food Insecurity: No Food Insecurity (12/29/2021)  Housing: Low Risk  (12/29/2021)  Transportation Needs: No Transportation Needs (12/29/2021)  Alcohol Screen: Low Risk  (12/29/2021)  Depression (PHQ2-9): Low Risk  (12/29/2021)  Recent Concern: Depression (PHQ2-9) - Medium Risk (11/15/2021)  Financial Resource Strain: Low Risk  (12/29/2021)  Physical Activity: Insufficiently Active (12/29/2021)  Social Connections: Moderately Integrated (12/29/2021)  Stress: No Stress Concern Present (12/29/2021)  Tobacco Use: Low Risk  (11/18/2022)     Readmission Risk Interventions     No data to display

## 2022-11-19 NOTE — Discharge Summary (Signed)
Patient ID: Pamela Patton MRN: [id number] DOB/AGE: 12-01-1945 77 y.o.  Admit date: 11/18/2022 Discharge date: 11/19/2022  Supervising Physician: Juliet Rude  Patient Status: [facility] - In-pt  Admission Diagnoses: Left kidney renal cell carcinoma   Discharge Diagnoses:  Principal Problem:   Renal cell carcinoma ([place]) Active Problems:   Renal cell carcinoma of left kidney [facility])   Discharged Condition: good  Hospital Course:   77 year old female with a medical history significant for stage III CKD, HTN, Parkinson's disease, and left RCC diagnosed in 2017. She has been under surveillance imaging since the time of diagnosis. Recent imaging showed growth of the lesion and she was referred to IR for percutaneous management.   She presented to the [facility] Interventional Radiology department 11/18/22 for an image-guided percutaneous cryoablation of the left kidney lesion. She had an uneventful procedure and was admitted for overnight observation. She had one brief episode of somnolence around 1445 11/18/22. No further episodes and the patient remained appropriately alert/oriented overnight.   This morning the patient is resting comfortably in bed. Her son is at the bedside and will be transporting the patient home. Foley catheter still in place with clear yellow urine in bag. I removed the foley catheter and also discontinued the PCA pump. She only used it a few times overnight and had not used it today. She denies pain/discomfort. I removed the dressing over her left back and the site had scant oozing. A new dressing was applied and the patient was instructed to leave the dressing in place for 24 hours.   The patient will be discharged today after she has voided, had some food/drink and ambulated in the room.  She will follow up with Dr. Pascal Lux in 3-4 weeks and a scheduler from our office will call her with a date/time. She will have repeat imaging in 3 months with an abdominal MRI.   The  patient and her son know they can call the IR clinic with any questions/concerns.   Consults: None  Significant Diagnostic Studies: CT GUIDE TISSUE ABLATION  Result Date: 11/18/2022 CLINICAL DATA:  Left-sided renal mass. Patient presents today for left-sided renal cryoablation. Please refer to formal consultation in the epic EMR dated 10/21/2022 for additional details. EXAM: ULTRASOUND AND CT-GUIDED LEFT RENAL CRYOABLATION ANESTHESIA/SEDATION: General MEDICATIONS: Ciprofloxacin 400 mg IV .The antibiotic was administered in an appropriate time interval prior to needle puncture of the skin. CONTRAST:  None PROCEDURE: The procedure, risks, benefits, and alternatives were explained to the patient. Questions regarding the procedure were encouraged and answered. The patient understands and consents to the procedure. The patient was placed under general anesthesia. Initial un-enhanced CT was performed in a prone position to localize the the approximately 3.0 x 2.5 cm partially exophytic mass arising from the inferior pole of the left kidney (image 7, series 3). Table position was marked and the partially exophytic renal mass was identified sonographically. The procedure was planned. The skin overlying the operative site was prepped and draped in usual sterile fashion. Initially, a 22 gauge spinal needle was utilized for procedural planning purposes as well as for the purposes of hydro dissection administration. Ultimately, utilizing a medial to lateral approach, under direct ultrasound guidance three 14 G Ice Force 2.1 Cx probes were placed bracketing the partially exophytic renal mass. Note, a medial to lateral approach was selected in hopes of better controlling the ice ball given the adjacent segment of descending colon. Appropriate positioning was confirmed with CT imaging. Next, prolonged efforts  were made advancing two separate 22 gauge spinal needles adjacent to the left kidney for the purposes of hydro  dissection ultimately with the administration of approximately 350 cc of fluid for the purposes of hydro dissection. Final positioning was confirmed with CT imaging and the cryoablation was initiated, ultimately with a 6 minute cycle with the 2 most superior probes running at 50%; a 6 minute passive thaw followed by an 8 minute freeze cycle, again with the 2 most superior probes running at 50%. Intra procedural imaging was obtained approximately every 2 minutes during the freeze cycle monitor ice ball formation and its proximity to the adjacent colon. After a passive thaw of 5 minutes, all cryoablation probes were removed intact and postprocedural CT imaging was obtained. Dressings were applied. The patient tolerated the procedure well without immediate postprocedural complication. COMPLICATIONS: None immediate. FINDINGS: Preprocedural noncontrast imaging demonstrates grossly unchanged appearance of the at least 3.0 x 2.5 cm partially exophytic mass arising from the posterolateral inferior aspect of the left kidney. Under direct ultrasound guidance, 3 cryoablation probes were placed bracketing the lesion with appropriate positioning confirmed with CT imaging. Following the administration hydro dissection, cryoablation was performed as detailed above with the ice ball encompassing the entirety of the expected location of the partially exophytic renal mass (series 18). Postprocedural imaging is negative for evidence of complication, specifically, no fracture or evidence of colonic injury or perforation. IMPRESSION: Technically successful ultrasound and CT guided percutaneous core biopsy and cryoablation of partially exophytic left renal mass. PLAN: 1. Patient will be admitted overnight for continued observation and PCA usage. 2. The patient will have either a telemedicine in-person consult in 3-4 weeks at the interventional radiology clinic. 3. Initial postprocedural imaging will be performed in 3 months (mid May  2024), likely with abdominal MRI as was obtained prior to the procedure. Electronically Signed   By: Sandi Mariscal M.D.   On: 11/18/2022 12:50    Treatments: analgesia: fentanyl PCA  Discharge Exam: Blood pressure (!) 102/47, pulse 68, temperature 98 F (36.7 C), temperature source Oral, resp. rate 16, height 5' 6"$  (1.676 m), weight 130 lb 11.7 oz (59.3 kg), SpO2 100 %. Physical Exam Constitutional:      General: She is not in acute distress.    Appearance: She is not ill-appearing.  HENT:     Mouth/Throat:     Mouth: Mucous membranes are moist.     Pharynx: Oropharynx is clear.  Cardiovascular:     Rate and Rhythm: Normal rate and regular rhythm.  Pulmonary:     Effort: Pulmonary effort is normal.  Abdominal:     Palpations: Abdomen is soft.     Tenderness: There is no abdominal tenderness.     Comments: Left back procedure access site. Dressing removed - scant amount of oozing at the puncture sites. New dressing placed.   Genitourinary:    Comments: Foley catheter with clear yellow urine in bag. Foley catheter now removed.  Musculoskeletal:     Right lower leg: No edema.     Left lower leg: No edema.  Skin:    General: Skin is warm and dry.  Neurological:     Mental Status: She is alert and oriented to person, place, and time.  Psychiatric:        Mood and Affect: Mood normal.        Behavior: Behavior normal.        Thought Content: Thought content normal.        Judgment: Judgment  normal.     Disposition: Discharge disposition: 01-Home or Self Care        Allergies as of 11/19/2022   No Known Allergies      Medication List     STOP taking these medications    feeding supplement (GLUCERNA 1.2 CAL) Liqd       TAKE these medications    Accu-Chek Aviva Plus test strip Generic drug: glucose blood Use to test blood sugar 4 times a day (Dx: E11.65)   ACTOS PO Take 45 mg by mouth at bedtime.   aspirin 81 MG tablet Take 1 tablet (81 mg total) by mouth  at bedtime. Ok to restart 11/20/22 What changed: additional instructions   atorvastatin 40 MG tablet Commonly known as: LIPITOR Take 40 mg by mouth at bedtime.   Blood Pressure Cuff Misc 1 Product by Does not apply route as needed.   Calcium Carbonate-Vitamin D 600-400 MG-UNIT tablet Take 1 tablet by mouth 3 (three) times a week.   carbidopa-levodopa 25-100 MG tablet Commonly known as: SINEMET IR TAKE TWO TABLETS BY MOUTH EVERY MORNING and TAKE TWO TABLETS BY MOUTH AT NOON and TAKE TWO TABLETS BY MOUTH EVERYDAY AT BEDTIME   glucose 4 GM chewable tablet Chew 1 tablet by mouth as needed for low blood sugar.   hydrochlorothiazide 25 MG tablet Commonly known as: HYDRODIURIL Take 25 mg by mouth at bedtime.   insulin glargine 100 UNIT/ML injection Commonly known as: LANTUS Inject 0.05 mLs (5 Units total) into the skin every evening. What changed: when to take this   INSULIN SYRINGE .3CC/31GX5/16" 31G X 5/16" 0.3 ML Misc Use as directed for Lantus injection once a day   levothyroxine 75 MCG tablet Commonly known as: SYNTHROID Take 75 mcg by mouth daily before breakfast.   losartan 50 MG tablet Commonly known as: COZAAR Take 1 tablet (50 mg total) by mouth daily. What changed: when to take this   NONFORMULARY OR COMPOUNDED ITEM [facility]:  Antifungal - Terbinafine 3%, Fluconazole 2%, Tea Tree Oil 5%, Urea 10%, Ibuprofen 2% in DMSO Suspension #22m. Apply to the affected nail(s) once (at bedtime) or twice daily. What changed:  how much to take how to take this when to take this reasons to take this additional instructions   NovoLOG FlexPen 100 UNIT/ML FlexPen Generic drug: insulin aspart Inject 3 Units into the skin 3 (three) times daily with meals. Sliding scale 101-120=1 unit 121-150=2 units 150-200=3 units 201-250= 4 units 251-300=5 units Over 300= units   OneTouch Delica Lancets 399991111Misc Use to test blood sugar 4 times a day (Dx: E11.65)   Polyethyl  Glycol-Propyl Glycol 0.4-0.3 % Gel ophthalmic gel Commonly known as: SYSTANE Place 1 application into both eyes at bedtime as needed (FOR EYES).   VITAMIN B12 PO Take 5,000 mcg by mouth 2 (two) times a week.   VITAMIN D PO Take 5,000 Units by mouth 3 (three) times a week.        Follow-up Information     WSandi Mariscal MD Follow up.   Specialties: Interventional Radiology, Radiology Why: Please follow up with Dr. WPascal Luxin 3-4 weeks. A scheduler from our office will call you with a date/time. Please call our office with any questions/concerns prior to your visit. Contact information: [address] [id number]                 Electronically Signed: JTheresa Duty NP 11/19/2022, 9:46 AM  I have spent Less Than 30 Minutes discharging [name].

## 2022-11-19 NOTE — Progress Notes (Signed)
PIV removed. AVS reviewed. Patient and son verbalize understanding of necessary follow up appts and medication regimen.

## 2022-11-19 NOTE — Progress Notes (Signed)
   11/18/22 2028  Assess: MEWS Score  Temp 98 F (36.7 C)  BP (!) 91/50  MAP (mmHg) (!) 62  Pulse Rate 70  Resp 17  SpO2 100 %  Assess: MEWS Score  MEWS Temp 0  MEWS Systolic 1  MEWS Pulse 0  MEWS RR 0  MEWS LOC 0  MEWS Score 1  MEWS Score Color Green  Assess: if the MEWS score is Yellow or Red  Were vital signs taken at a resting state? Yes  Focused Assessment No change from prior assessment  Does the patient meet 2 or more of the SIRS criteria? No  MEWS guidelines implemented  Yes, yellow  Treat  MEWS Interventions Considered administering scheduled or prn medications/treatments as ordered  Take Vital Signs  Increase Vital Sign Frequency  Yellow: Q2hr x1, continue Q4hrs until patient remains green for 12hrs  Escalate  MEWS: Escalate Yellow: Discuss with charge nurse and consider notifying provider and/or RRT  Notify: Charge Nurse/RN  Name of Charge Nurse/RN Notified [facility]  Provider Notification  Provider Name/Title Doreene Adas  Date Provider Notified 11/19/22  Time Provider Notified 0000  Method of Notification Page  Notification Reason Other (Comment) (Yellow MEWS,low blood pressure)  Provider response No new orders  Date of Provider Response 11/19/22  Time of Provider Response 0055  Assess: SIRS CRITERIA  SIRS Temperature  0  SIRS Pulse 0  SIRS Respirations  0  SIRS WBC 0  SIRS Score Sum  0

## 2022-11-21 ENCOUNTER — Encounter (HOSPITAL_COMMUNITY): Payer: Self-pay | Admitting: Interventional Radiology

## 2022-11-21 ENCOUNTER — Telehealth: Payer: Self-pay | Admitting: Family Medicine

## 2022-11-21 ENCOUNTER — Telehealth: Payer: Self-pay

## 2022-11-21 LAB — GLUCOSE, CAPILLARY
Glucose-Capillary: 156 mg/dL — ABNORMAL HIGH (ref 70–99)
Glucose-Capillary: 195 mg/dL — ABNORMAL HIGH (ref 70–99)

## 2022-11-21 NOTE — Anesthesia Postprocedure Evaluation (Signed)
Anesthesia Post Note  Patient: Pamela Patton  Procedure(s) Performed: Left Renal Cryoablation (Left)     Patient location during evaluation: PACU Anesthesia Type: General Level of consciousness: awake and alert Pain management: pain level controlled Vital Signs Assessment: post-procedure vital signs reviewed and stable Respiratory status: spontaneous breathing, nonlabored ventilation, respiratory function stable and patient connected to nasal cannula oxygen Cardiovascular status: blood pressure returned to baseline and stable Postop Assessment: no apparent nausea or vomiting Anesthetic complications: no   No notable events documented.  Last Vitals:  Vitals:   11/19/22 0621 11/19/22 0839  BP: 112/61 (!) 102/47  Pulse: 72 68  Resp: (!) 22 16  Temp: 36.6 C 36.7 C  SpO2: 100% 100%    Last Pain:  Vitals:   11/19/22 0853  TempSrc:   PainSc: 0-No pain                 Tiajuana Amass

## 2022-11-21 NOTE — Transitions of Care (Post Inpatient/ED Visit) (Signed)
   11/21/2022  Name: Pamela Patton MRN: [id number] DOB: 06-Jul-1946  Today's TOC FU Call Status: Today's TOC FU Call Status:: Successful TOC FU Call Competed TOC FU Call Complete Date: 11/21/22  Transition Care Management Follow-up Telephone Call Date of Discharge: 11/18/22 Discharge Facility: [facility]) Type of Discharge: Inpatient Admission Primary Inpatient Discharge Diagnosis:: Renal cell carcinoma How have you been since you were released from the hospital?: Better Any questions or concerns?: No  Items Reviewed: Did you receive and understand the discharge instructions provided?: Yes Medications obtained and verified?: Yes (Medications Reviewed) Any new allergies since your discharge?: No Dietary orders reviewed?: Yes Do you have support at home?: No  Home Care and Equipment/Supplies: [place] Ordered?: No Any new equipment or medical supplies ordered?: No  Functional Questionnaire: Do you need assistance with bathing/showering or dressing?: No Do you need assistance with meal preparation?: No Do you need assistance with eating?: No Do you have difficulty maintaining continence: No Do you need assistance with getting out of bed/getting out of a chair/moving?: No Do you have difficulty managing or taking your medications?: No  Folllow up appointments reviewed: PCP Follow-up appointment confirmed?: Yes Date of PCP follow-up appointment?: 11/23/22 [facility] Follow-up appointment confirmed?: Yes Date of Specialist follow-up appointment?: 12/01/22 Follow-Up Specialty Provider:: Chloe Gilgannon Do you need transportation to your follow-up appointment?: No Do you understand care options if your condition(s) worsen?: Yes-patient verbalized understanding    Amory LPN [place] Direct Dial [phone]

## 2022-11-21 NOTE — Telephone Encounter (Signed)
Discharged from hospital Saturday, asking if she should continue NOVOLOG FLEXPEN 100 UNIT/ML FlexPen at prescribed amount or make changes. Has OV 11/23/22

## 2022-11-22 NOTE — Telephone Encounter (Signed)
Patient informed of the message below.

## 2022-11-22 NOTE — Telephone Encounter (Signed)
Yes I would continue the hospital's instructions until she sees me tomorrow.

## 2022-11-23 ENCOUNTER — Encounter: Payer: Self-pay | Admitting: Family Medicine

## 2022-11-23 ENCOUNTER — Ambulatory Visit (INDEPENDENT_AMBULATORY_CARE_PROVIDER_SITE_OTHER): Payer: Medicare PPO | Admitting: Family Medicine

## 2022-11-23 VITALS — BP 142/66 | HR 90 | Temp 98.2°F | Ht 66.0 in | Wt 133.4 lb

## 2022-11-23 DIAGNOSIS — G20A1 Parkinson's disease without dyskinesia, without mention of fluctuations: Secondary | ICD-10-CM

## 2022-11-23 DIAGNOSIS — C642 Malignant neoplasm of left kidney, except renal pelvis: Secondary | ICD-10-CM

## 2022-11-23 NOTE — Assessment & Plan Note (Signed)
Pt is doing well postoperatively, she will be following up with Dr. Pascal Lux in 3-4 weeks. Her small surgical wound is well healed, reapplied bandaids

## 2022-11-23 NOTE — Assessment & Plan Note (Signed)
Pt had her license taken away. I have printed out Dr. Doristine Devoid note for her which has the phone numbers to the driving test center. I will change her PT order to Home health since she cannot leave home without assistance

## 2022-11-23 NOTE — Patient Instructions (Addendum)
Dr. Pascal Lux' office information: he wants to see you the first or second week of March  [address] [place] [address] [phone]

## 2022-11-23 NOTE — Progress Notes (Signed)
Established Patient Office Visit  Subjective   Patient ID: Pamela Patton, female    DOB: 06-04-1946  Age: 77 y.o. MRN: [id number]  Chief Complaint  Patient presents with   Hospitalization Follow-up    Patient is here for hospital follow up.   Patient was admitted for tissue ablation due to renal cell CA. Patient spent the night in the hospital for observation overnight and sent home on 11/18/22. Pt reports she is not in any pain, states she is not sure if there is a wound on her back and she would like me to check it.   Pt states that she continues to see Dr. Grandville Silos for her diabetes, sates she is wearing a CGM now and is still getting used to it.   Parkinson's-- reviewed Dr .Doristine Devoid note from January, pt states that the [facility] needed paperwork so that she can continue driving, however she reports that they took her license and she does not have transportation. States that she ordered PT but she is not sure if she will be able to get transportation to and from the outpatient PT place.    Current Outpatient Medications  Medication Instructions   aspirin 81 mg, Oral, Daily at bedtime, Ok to restart 11/20/22   atorvastatin (LIPITOR) 40 mg, Oral, Daily at bedtime   Blood Pressure Monitoring (BLOOD PRESSURE CUFF) MISC 1 Product, Does not apply, As needed   Calcium Carbonate-Vitamin D 600-400 MG-UNIT tablet 1 tablet, Oral, 3 times weekly   carbidopa-levodopa (SINEMET IR) 25-100 MG tablet TAKE TWO TABLETS BY MOUTH EVERY MORNING and TAKE TWO TABLETS BY MOUTH AT NOON and TAKE TWO TABLETS BY MOUTH EVERYDAY AT BEDTIME   Cholecalciferol (VITAMIN D PO) 5,000 Units, Oral, 3 times weekly   Cyanocobalamin (VITAMIN B12 PO) 5,000 mcg, Oral, 2 times weekly   glucose 4 GM chewable tablet 1 tablet, Oral, As needed   glucose blood (ACCU-CHEK AVIVA PLUS) test strip Use to test blood sugar 4 times a day (Dx: E11.65)   hydrochlorothiazide (HYDRODIURIL) 25 mg, Oral, Daily at bedtime   insulin glargine (LANTUS) 5  Units, Subcutaneous, Every evening   Insulin Syringe-Needle U-100 (INSULIN SYRINGE .3CC/31GX5/16") 31G X 5/16" 0.3 ML MISC Use as directed for Lantus injection once a day   levothyroxine (SYNTHROID) 75 mcg, Oral, Daily before breakfast   losartan (COZAAR) 50 mg, Oral, Daily   NONFORMULARY OR COMPOUNDED ITEM [facility]:  Antifungal - Terbinafine 3%, Fluconazole 2%, Tea Tree Oil 5%, Urea 10%, Ibuprofen 2% in DMSO Suspension #14m. Apply to the affected nail(s) once (at bedtime) or twice daily.   NovoLOG FlexPen 3 Units, Subcutaneous, 3 times daily with meals, Sliding scale<BR>101-120=1 unit<BR>121-150=2 units<BR>150-200=3 units<BR>201-250= 4 units<BR>251-300=5 units<BR>Over 300= units   OneTouch Delica Lancets 399991111MISC Use to test blood sugar 4 times a day (Dx: E11.65)   Pioglitazone HCl (ACTOS PO) 45 mg, Oral, Daily at bedtime   Polyethyl Glycol-Propyl Glycol (SYSTANE) 0.4-0.3 % GEL ophthalmic gel 1 application , Both Eyes, At bedtime PRN        Review of Systems  Constitutional:  Negative for chills and fever.  All other systems reviewed and are negative.     Objective:     BP (!) 142/66 Comment: repeated by Mykal--jaf  Pulse 90   Temp 98.2 F (36.8 C) (Oral)   Ht 5' 6"$  (1.676 m)   Wt 133 lb 6.4 oz (60.5 kg)   SpO2 99%   BMI 21.53 kg/m     Physical Exam Constitutional:  Appearance: Normal appearance. She is normal weight.  Eyes:     Conjunctiva/sclera: Conjunctivae normal.  Pulmonary:     Effort: Pulmonary effort is normal.  Musculoskeletal:       Back:     Comments: Small surgical healing wound on the left flank, no surrounding erythema or edema, it is healing well  Neurological:     Mental Status: She is alert and oriented to person, place, and time. Mental status is at baseline.  Psychiatric:        Mood and Affect: Mood normal. Affect is flat.        Behavior: Behavior normal.      No results found for any visits on 11/23/22.     The 10-year  ASCVD risk score (Arnett DK, et al., 2019) is: 38.7%    Assessment & Plan:   Problem List Items Addressed This Visit       Unprioritized   PD (Parkinson's disease) - Primary    Pt had her license taken away. I have printed out Dr. Doristine Devoid note for her which has the phone numbers to the driving test center. I will change her PT order to Home health since she cannot leave home without assistance      Relevant Orders   Ambulatory referral to [place]   Renal cell carcinoma of left kidney ([place])    Pt is doing well postoperatively, she will be following up with Dr. Pascal Lux in 3-4 weeks. Her small surgical wound is well healed, reapplied bandaids       Return in about 6 months (around 05/24/2023).    Farrel Conners, MD

## 2022-12-01 ENCOUNTER — Encounter: Payer: Self-pay | Admitting: Physical Therapy

## 2022-12-01 ENCOUNTER — Ambulatory Visit: Payer: Medicare PPO | Attending: Family Medicine | Admitting: Physical Therapy

## 2022-12-01 ENCOUNTER — Ambulatory Visit: Payer: Medicare PPO | Admitting: Speech Pathology

## 2022-12-01 ENCOUNTER — Ambulatory Visit: Payer: Medicare PPO | Admitting: Occupational Therapy

## 2022-12-01 DIAGNOSIS — R471 Dysarthria and anarthria: Secondary | ICD-10-CM | POA: Insufficient documentation

## 2022-12-01 DIAGNOSIS — R2689 Other abnormalities of gait and mobility: Secondary | ICD-10-CM | POA: Insufficient documentation

## 2022-12-01 DIAGNOSIS — R41841 Cognitive communication deficit: Secondary | ICD-10-CM | POA: Insufficient documentation

## 2022-12-01 NOTE — Therapy (Signed)
[facility] [address] [place], [place], [address] Phone: [phone]   Fax:  [phone]  Patient Details  Name: Pamela Patton MRN: [id number] Date of Birth: 1945/12/23 Referring Provider:  Farrel Conners, MD  Encounter Date: 12/01/2022  Speech Therapy Parkinson's Disease Screen   Decibel Level today: 67 dB  (WNL=70-72 dB) with sound level meter 30cm away from pt's mouth, notable for imprecise articulation. Volume increased to 68-70 dB across screening. Pt's conversational volume has remained consistent since last treatment course. Pt reports she sings in weekly church choir which has been helpful for maintaining volume.   Pt does not not report difficulty with swallowing, which does not warrant further evaluation. Only difficulty is "with large pills, but that's always been a problem."  Pt endorses changes in cognition, reports "small changes" in memory. Additionally, describes challenges with managing complex information resulting in occasional disorientation (e.g. was unaware of where today's appointment was or why she had it scheduled).   Pt would benefit from speech-language eval for dysarthria and cognitive communication impairment. Pt requests to have evaluations scheduled in conjunction with OP PT which she plans to do once she finishes with HHPT.   Su Monks, [place] 12/01/2022, 7:42 AM  [facility] [address] [place], [place], [address] Phone: [phone]   Fax:  [phone]

## 2022-12-01 NOTE — Therapy (Signed)
[facility] [address] [place], [place], [address] Phone: [phone]   Fax:  [phone]  Patient Details  Name: Pamela Patton MRN: [id number] Date of Birth: 1946/03/30 Referring Provider:  No ref. provider found  Encounter Date: 12/01/2022   Physical Therapy Parkinson's Disease Screen   Has been sleeping a lot. No falls. Patient was admitted for tissue ablation due to renal cell CA. Patient spent the night in the hospital for observation overnight and sent home on 11/18/22. Pt feeling more tired after this. Walking in general has gotten harder. No longer driving, has a driver and they walk together at the grocery store. Also using a foot bike. Just started HHPT due to no longer driving and got referral to this from her PCP.   Had discussion with pt and friend Milus Banister) that pt can't get HHPT and OP therapy at the same time due to insurance reasons. Discussed benefits of PD specific therapy at this outpatient location and importance of PD specific exercises. Provided explanation of PD and purpose of education/exercises for symptoms of PD. Pt wants to do HHPT for a little bit and then come to OP PT and likely ST as well (where her friend Milus Banister will drive her to appts). Discussed with pt will follow-up with her in a month and then request orders for outpatient therapies with pt in agreement with plan.   Arliss Journey, PT, DPT  12/01/2022, 10:20 AM  [facility] [address] [place], [place], [address] Phone: [phone]   Fax:  [phone]

## 2022-12-13 ENCOUNTER — Ambulatory Visit
Admission: RE | Admit: 2022-12-13 | Discharge: 2022-12-13 | Disposition: A | Discharge status: Home / Self Care | Payer: Medicare PPO | Source: Ambulatory Visit | Attending: Student | Admitting: Student

## 2022-12-13 DIAGNOSIS — C642 Malignant neoplasm of left kidney, except renal pelvis: Secondary | ICD-10-CM

## 2022-12-13 NOTE — Progress Notes (Signed)
Patient ID: Pamela Patton, female   DOB: [birth year], 77 y.o.   MRN: [id number]        Chief Complaint: Post renal Left-sided renal cryoablation (11/18/2022)  Referring Physician(s): Ardis Hughs [name], Wells Guiles (Parkinson's physician)   History of Present Illness: Pamela Patton is a 77 y.o. female with complex past medical history significant for stage III chronic kidney disease, hypertension, hyperlipidemia, hypothyroidism and Parkinson disease who Is seen today in telemedicine consultation following left-sided renal cryoablation performed 11/18/2022.  In review, the left-sided renal mass was initially discovered on abdominal CT performed on 06/16/2016 performed for weight loss and anemia.  At that time, an approximately 1.5 cm enhancing lesion was identified within the posterior inferior pole of the left kidney highly suspicious for renal cell carcinoma.  Subsequent abdominal MRI was performed on 06/21/2016 confirmed the lesion to be worrisome for a renal cell carcinoma.   Patient was subsequently referred to Dr. Louis Meckel who has been doing surveillance imaging of this lesion as the patient has been hesitant to undergo intervention given her medical comorbidities, primarily her Parkinson's disease.   Fortunately, her Parkinson disease has remained relatively stable for the past several years, but unfortunately the left-sided renal lesion has continued to demonstrate progressive growth. Most recent abdominal MRI performed 06/24/2022 demonstrates the lesion measures approximately 3.2 x 3.1 x 3.0 cm, for which the patient was seen in consultation on 1/192024, ultimately undergoing renal cryoablation on 11/18/2022.  The patient was admitted overnight for observation was discharged the following day.  The patient reports very ild intermittent transient left-sided flank pain though in general states she "does not even realize that she has had a procedure."  She denies hematuria.  She denies change in  bladder or bowel function.  No fever or chills.  The patient's main complaint is regarding sleep disturbances which I explained are probably not associated with our procedure and is outside my area of expertise.   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   Carotid artery disease 03/21/2017   Cataract    surgery to remove   Chest pain summer 2012   Chicken pox    CKD (chronic kidney disease) 10/08/2012   Stage 3, GFR 30-59 ml/min   Diverticulosis    difficulty with colonoscopy 2005   DM (diabetes mellitus), type 2 with renal complications 99991111   Fracture    radial/ulnar 03/2016   GERD (gastroesophageal reflux disease)    Heart murmur    innocent murmur   High cholesterol    Hypertension associated with diabetes 02/22/2017   Hyperuricemia 10/31/2016   Hypothyroidism    s/p radioactive iodine tx for graves in 2001   Intestinal metaplasia of gastric mucosa    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   Neuromuscular disorder ([place]) 08/08/2016   Parkinson's   UTI (urinary tract infection)    Vitamin D deficiency 08/24/2012   Weight loss    eval with GI in 2016; s/p CT/MRI and PET scans    Past Surgical History:  Procedure Laterality Date   ABDOMINAL HYSTERECTOMY  1989   CATARACT EXTRACTION, BILATERAL     COLONOSCOPY  07/28/2016   x several, last one in 2017   DG BARIUM SWALLOW ([place] HX)     9 yrs ago per pt due to incomplete colonsocopy   RADIOLOGY WITH ANESTHESIA Left 11/18/2022   Procedure: Left Renal Cryoablation;  Surgeon: Sandi Mariscal, MD;  Location: [facility]  OR;  Service: Radiology;  Laterality: Left;   UPPER GI ENDOSCOPY  07/28/2016    Allergies: Patient has no known allergies.  Medications: Prior to Admission medications   Medication Sig Start Date End Date Taking? Authorizing Provider  aspirin 81 MG tablet Take 1 tablet (81 mg total) by mouth at bedtime. Ok to restart 11/20/22 11/19/22   Theresa Duty, NP  atorvastatin (LIPITOR) 40 MG tablet Take 40 mg by mouth at bedtime.    [provider]  Blood Pressure Monitoring (BLOOD PRESSURE CUFF) MISC 1 Product by Does not apply route as needed. 05/21/19   Lucretia Kern, DO  Calcium Carbonate-Vitamin D 600-400 MG-UNIT tablet Take 1 tablet by mouth 3 (three) times a week.     [provider]  carbidopa-levodopa (SINEMET IR) 25-100 MG tablet TAKE TWO TABLETS BY MOUTH EVERY MORNING and TAKE TWO TABLETS BY MOUTH AT NOON and TAKE TWO TABLETS BY MOUTH EVERYDAY AT BEDTIME 11/07/22   [name], Eustace Quail, DO  Cholecalciferol (VITAMIN D PO) Take 5,000 Units by mouth 3 (three) times a week.     [provider]  Cyanocobalamin (VITAMIN B12 PO) Take 5,000 mcg by mouth 2 (two) times a week.    [provider]  glucose 4 GM chewable tablet Chew 1 tablet by mouth as needed for low blood sugar.    [provider]  glucose blood (ACCU-CHEK AVIVA PLUS) test strip Use to test blood sugar 4 times a day (Dx: E11.65) 12/25/19   [provider]  hydrochlorothiazide (HYDRODIURIL) 25 MG tablet Take 25 mg by mouth at bedtime. 06/29/12   [provider]  insulin glargine (LANTUS) 100 UNIT/ML injection Inject 0.05 mLs (5 Units total) into the skin every evening. Patient taking differently: Inject 5 Units into the skin at bedtime. 11/01/21   Caren Macadam, MD  Insulin Syringe-Needle U-100 (INSULIN SYRINGE .3CC/31GX5/16") 31G X 5/16" 0.3 ML MISC Use as directed for Lantus injection once a day 12/25/19   [provider]  levothyroxine (SYNTHROID, LEVOTHROID) 75 MCG tablet Take 75 mcg by mouth daily before breakfast.    [provider]  losartan (COZAAR) 50 MG tablet Take 1 tablet (50 mg total) by mouth daily. Patient taking differently: Take 50 mg by mouth at bedtime. 12/18/21   Caren Macadam, MD  NONFORMULARY OR COMPOUNDED ITEM [facility]:  Antifungal - Terbinafine 3%, Fluconazole 2%, Tea Tree  Oil 5%, Urea 10%, Ibuprofen 2% in DMSO Suspension #49m. Apply to the affected nail(s) once (at bedtime) or twice daily. Patient taking differently: Apply 1 application  topically as needed (Toe nail fungus). [facility]:  Antifungal - Terbinafine 3%, Fluconazole 2%, Tea Tree Oil 5%, Urea 10%, Ibuprofen 2% in DMSO Suspension #321m Apply to the affected nail(s) as needed for fungus 10/31/18   [name], [name], [name]  NOVOLOG FLEXPEN 100 UNIT/ML FlexPen Inject 3 Units into the skin 3 (three) times daily with meals. Sliding scale 101-120=1 unit 121-150=2 units 150-200=3 units 201-250= 4 units 251-300=5 units Over 300= units 11/09/22   [provider]  OneTouch Delica Lancets [id number] Use to test blood sugar 4 times a day (Dx: E11.65) 12/25/19   [provider]  Pioglitazone HCl (ACTOS PO) Take 45 mg by mouth at bedtime.    [provider]  Polyethyl Glycol-Propyl Glycol (SYSTANE) 0.4-0.3 % GEL ophthalmic gel Place 1 application into both eyes at bedtime as needed (FOR EYES).    [provider]  Family History  Problem Relation Age of Onset   Cancer Mother        liver   Liver cancer Mother    Stroke Father 2   Diabetes Father    Hypertension Father    Diabetes Maternal Grandmother    Prostate cancer Maternal Grandfather    Pancreatic cancer Brother    Diabetes Sister    Cancer Sister    Diabetes Sister    Colon polyps Neg Hx    Colon cancer Neg Hx    Esophageal cancer Neg Hx    Rectal cancer Neg Hx    Stomach cancer Neg Hx     Social History   Socioeconomic History   Marital status: Single    Spouse name: Not on file   Number of children: 1   Years of education: 20   Highest education level: Professional school degree (e.g., MD, DDS, DVM, JD)  Occupational History   Occupation: retired    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[employer], histology, anatomy and physiology  Tobacco Use   Smoking status: Never   Smokeless tobacco: Never  Vaping Use    Vaping Use: Never used  Substance and Sexual Activity   Alcohol use: No    Alcohol/week: 0.0 standard drinks of alcohol   Drug use: No   Sexual activity: Not Currently    Birth control/protection: Surgical    Comment: ABD Hysterectomy  Other Topics Concern   Not on file  Social History Narrative   Work: recently retired Firefighter, 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 Situation:  Lives alone, no family around, good friendsSpiritual Beliefs: christian, very involved with her church - [employer] churchLifestyle: exercising 5 days per week, trying to eat healthy   Caffeine 1/2 cup daily   Social Determinants of Health   Financial Resource Strain: Low Risk  (12/29/2021)   Overall Financial Resource Strain (CARDIA)    Difficulty of Paying Living Expenses: Not hard at all  Food Insecurity: No Food Insecurity (12/29/2021)   Hunger Vital Sign    Worried About Running Out of Food in the Last Year: Never true    [place] in the Last Year: Never true  Transportation Needs: No Transportation Needs (12/29/2021)   PRAPARE - Hydrologist (Medical): No    Lack of Transportation (Non-Medical): No  Physical Activity: Insufficiently Active (12/29/2021)   Exercise Vital Sign    Days of Exercise per Week: 4 days    Minutes of Exercise per Session: 30 min  Stress: No Stress Concern Present (12/29/2021)   [place]    Feeling of Stress : Not at all  Social Connections: Moderately Integrated (12/29/2021)   Social Connection and Isolation Panel [NHANES]    Frequency of Communication with Friends and Family: More than three times a week    Frequency of Social Gatherings with Friends and Family: More than three times a week    Attends Religious Services: More than 4 times per year    Active Member of [employer] or Organizations: Yes    Attends Music therapist:  More than 4 times per year    Marital Status: Never married    ECOG Status: 1 - Symptomatic but completely ambulatory  Review of Systems  Review of Systems: A 12 point ROS discussed and pertinent positives are indicated in the HPI above.  All other systems are negative.   Physical  Exam No direct physical exam was performed (except for noted visual exam findings with Video Visits).   Vital Signs: There were no vitals taken for this visit.  Imaging: CT GUIDE TISSUE ABLATION  Result Date: 11/18/2022 CLINICAL DATA:  Left-sided renal mass. Patient presents today for left-sided renal cryoablation. Please refer to formal consultation in the epic EMR dated 10/21/2022 for additional details. EXAM: ULTRASOUND AND CT-GUIDED LEFT RENAL CRYOABLATION ANESTHESIA/SEDATION: General MEDICATIONS: Ciprofloxacin 400 mg IV .The antibiotic was administered in an appropriate time interval prior to needle puncture of the skin. CONTRAST:  None PROCEDURE: The procedure, risks, benefits, and alternatives were explained to the patient. Questions regarding the procedure were encouraged and answered. The patient understands and consents to the procedure. The patient was placed under general anesthesia. Initial un-enhanced CT was performed in a prone position to localize the the approximately 3.0 x 2.5 cm partially exophytic mass arising from the inferior pole of the left kidney (image 7, series 3). Table position was marked and the partially exophytic renal mass was identified sonographically. The procedure was planned. The skin overlying the operative site was prepped and draped in usual sterile fashion. Initially, a 22 gauge spinal needle was utilized for procedural planning purposes as well as for the purposes of hydro dissection administration. Ultimately, utilizing a medial to lateral approach, under direct ultrasound guidance three 14 G Ice Force 2.1 Cx probes were placed bracketing the partially exophytic renal mass.  Note, a medial to lateral approach was selected in hopes of better controlling the ice ball given the adjacent segment of descending colon. Appropriate positioning was confirmed with CT imaging. Next, prolonged efforts were made advancing two separate 22 gauge spinal needles adjacent to the left kidney for the purposes of hydro dissection ultimately with the administration of approximately 350 cc of fluid for the purposes of hydro dissection. Final positioning was confirmed with CT imaging and the cryoablation was initiated, ultimately with a 6 minute cycle with the 2 most superior probes running at 50%; a 6 minute passive thaw followed by an 8 minute freeze cycle, again with the 2 most superior probes running at 50%. Intra procedural imaging was obtained approximately every 2 minutes during the freeze cycle monitor ice ball formation and its proximity to the adjacent colon. After a passive thaw of 5 minutes, all cryoablation probes were removed intact and postprocedural CT imaging was obtained. Dressings were applied. The patient tolerated the procedure well without immediate postprocedural complication. COMPLICATIONS: None immediate. FINDINGS: Preprocedural noncontrast imaging demonstrates grossly unchanged appearance of the at least 3.0 x 2.5 cm partially exophytic mass arising from the posterolateral inferior aspect of the left kidney. Under direct ultrasound guidance, 3 cryoablation probes were placed bracketing the lesion with appropriate positioning confirmed with CT imaging. Following the administration hydro dissection, cryoablation was performed as detailed above with the ice ball encompassing the entirety of the expected location of the partially exophytic renal mass (series 18). Postprocedural imaging is negative for evidence of complication, specifically, no fracture or evidence of colonic injury or perforation. IMPRESSION: Technically successful ultrasound and CT guided percutaneous core biopsy and  cryoablation of partially exophytic left renal mass. PLAN: 1. Patient will be admitted overnight for continued observation and PCA usage. 2. The patient will have either a telemedicine in-person consult in 3-4 weeks at the interventional radiology clinic. 3. Initial postprocedural imaging will be performed in 3 months (mid May 2024), likely with abdominal MRI as was obtained prior to the procedure. Electronically Signed   By:  Sandi Mariscal M.D.   On: 11/18/2022 12:50    Labs:  CBC: Recent Labs    05/26/22 0000 11/18/22 0750  WBC  --  4.7  HGB 9.5* 9.2*  HCT  --  30.1*  PLT  --  202    COAGS: Recent Labs    11/18/22 0750  INR 1.3*    BMP: Recent Labs    11/18/22 0750  NA 141  K 4.3  CL 106  CO2 24  GLUCOSE 147*  BUN 22  CALCIUM 9.2  CREATININE 1.24*  GFRNONAA 45*    LIVER FUNCTION TESTS: No results for input(s): "BILITOT", "AST", "ALT", "ALKPHOS", "PROT", "ALBUMIN" in the last 8760 hours.  TUMOR MARKERS: No results for input(s): "AFPTM", "CEA", "CA199", "CHROMGRNA" in the last 8760 hours.  Assessment and Plan:  Pamela Patton is a 77 y.o. female with complex past medical history significant for stage III chronic kidney disease, hypertension, hyperlipidemia, hypothyroidism and Parkinson disease who Is seen today in telemedicine consultation following left-sided renal cryoablation performed 11/18/2022.  The patient was admitted overnight for observation was discharged the following day.  The patient reports very ild intermittent transient left-sided flank pain though in general states she "does not even realize that she has had a procedure."  She denies hematuria.  She denies change in bladder or bowel function.  No fever or chills.  Personal review of procedural images during left-sided renal cryoablation performed 11/18/2022 demonstrates a technically excellent result with the ice ball encompassing the expected entirety of the partially exophytic left-sided renal mass.     The main technical challenge associated with the procedure was providing enough hydro dissection for adequate displacement of the splenic flexure and descending colon.  As such, I am very pleased to report patient has not had no change in her bowel function, no worsening abdominal pain and no fever or chills.  I explained that I am hopeful the procedure resulted in a cure for her renal cell carcinoma however this will be confirmed with serial surveillance examinations as surgical margins were not obtained at the time of the cryoablation.  As such, the first surveillance scan will be performed 3 months following of the cryoablation (mid March 2024.  As patient has undergone surveillance with MRI, we will initially proceed with abdominal MRI for surveillance purposes and utilize CT scan for problem-solving as indicated.  The patient demonstrated excellent understanding of above conversation and was very appreciative of the care she received at [facility].  Plan:  - Follow-up telemedicine consultation following acquisition of initial postprocedural renal protocol MRI to be obtained at 3 months following the renal cryoablation (mid May 2024).  Thank you for this interesting consult.  I greatly enjoyed meeting Pamela Patton and look forward to participating in their care.  A copy of this report was sent to the requesting provider on this date.  Electronically Signed: Sandi Mariscal 12/13/2022, 11:59 AM   I spent a total of 15 Minutes in remote  clinical consultation, greater than 50% of which was counseling/coordinating care for post left sided renal cryoablation.    Visit type: Audio only (telephone). Audio (no video) only due to patient's lack of internet/smartphone capability. Alternative for in-person consultation at [facility], [address], [place], [place]. This visit type was conducted due to national recommendations for restrictions regarding the COVID-19 Pandemic  (e.g. social distancing).  This format is felt to be most appropriate for this patient at this time.  All issues  noted in this document were discussed and addressed.

## 2022-12-19 LAB — HM DIABETES EYE EXAM

## 2022-12-21 ENCOUNTER — Telehealth: Payer: Self-pay | Admitting: Family Medicine

## 2022-12-21 NOTE — Telephone Encounter (Signed)
Left a detailed message with the orders as below on Pamela Patton's voicemail.

## 2022-12-21 NOTE — Telephone Encounter (Signed)
Yes have her hold the hydrochlorothiazide if her BP is less than 110/80 in the mornings, she should take the other medication as prescribed.

## 2022-12-21 NOTE — Telephone Encounter (Signed)
Tillie Rung, PT with [facility] [phone] - Direct Line  Calling to report Pt's BP as irregular (up & down) Was low this a.m, but now it is about 127/52  Pt takes BP meds in the afternoon Pt has a monitor  Would MD like to set parameters? (ie: When to take?  When to skip?)  Pt's BP normally taken at night  Please advise.

## 2022-12-28 ENCOUNTER — Telehealth: Payer: Self-pay | Admitting: Family Medicine

## 2022-12-28 NOTE — Telephone Encounter (Signed)
Contacted Pamela Patton to schedule their annual wellness visit. Appointment made for 01/02/23.  [employer] AW[facility] direct phone # [phone]  Spoke to patient to change her phone appt  time on 4/1  to 3pm Pt aware of appt time change

## 2023-01-02 ENCOUNTER — Ambulatory Visit (INDEPENDENT_AMBULATORY_CARE_PROVIDER_SITE_OTHER): Payer: Medicare PPO

## 2023-01-02 VITALS — Ht 66.0 in | Wt 133.0 lb

## 2023-01-02 DIAGNOSIS — Z0000 Encounter for general adult medical examination without abnormal findings: Secondary | ICD-10-CM

## 2023-01-02 NOTE — Progress Notes (Signed)
I connected with  [name] on 01/02/23 by a audio enabled telemedicine application and verified that I am speaking with the correct person using two identifiers.  Patient Location: Home  Provider Location: Office/Clinic  I discussed the limitations of evaluation and management by telemedicine. The patient expressed understanding and agreed to proceed.  Subjective:   Pamela Patton is a 77 y.o. female who presents for Medicare Annual (Subsequent) preventive examination.  Review of Systems     Cardiac Risk Factors include: advanced age (>24men, >33 women);diabetes mellitus;dyslipidemia;hypertension     Objective:    Today's Vitals   01/02/23 1556  Weight: 133 lb (60.3 kg)  Height: 5\' 6"  (1.676 m)   Body mass index is 21.47 kg/m.     01/02/2023    4:10 PM 11/18/2022    7:07 AM 10/24/2022    8:22 AM 03/03/2022   10:23 AM 02/21/2022    2:09 PM 12/29/2021    1:38 PM 11/15/2021   10:18 AM  Advanced Directives  Does Patient Have a Medical Advance Directive? No Yes No No No Yes Yes  Type of Sales promotion account executive of [place];Living will [place];Living will  Does patient want to make changes to medical advance directive?      No - Patient declined No - Patient declined  Copy of [place] in Chart?      No - copy requested No - copy requested    Current Medications (verified) Outpatient Encounter Medications as of 01/02/2023  Medication Sig   aspirin 81 MG tablet Take 1 tablet (81 mg total) by mouth at bedtime. Ok to restart 11/20/22   atorvastatin (LIPITOR) 40 MG tablet Take 40 mg by mouth at bedtime.   Blood Pressure Monitoring (BLOOD PRESSURE CUFF) MISC 1 Product by Does not apply route as needed.   Calcium Carbonate-Vitamin D 600-400 MG-UNIT tablet Take 1 tablet by mouth 3 (three) times a week.    carbidopa-levodopa (SINEMET IR) 25-100 MG tablet TAKE TWO TABLETS BY MOUTH EVERY MORNING and TAKE TWO  TABLETS BY MOUTH AT NOON and TAKE TWO TABLETS BY MOUTH EVERYDAY AT BEDTIME   Cholecalciferol (VITAMIN D PO) Take 5,000 Units by mouth 3 (three) times a week.    Cyanocobalamin (VITAMIN B12 PO) Take 5,000 mcg by mouth 2 (two) times a week.   glucose 4 GM chewable tablet Chew 1 tablet by mouth as needed for low blood sugar.   glucose blood (ACCU-CHEK AVIVA PLUS) test strip Use to test blood sugar 4 times a day (Dx: E11.65)   hydrochlorothiazide (HYDRODIURIL) 25 MG tablet Take 25 mg by mouth at bedtime.   insulin glargine (LANTUS) 100 UNIT/ML injection Inject 0.05 mLs (5 Units total) into the skin every evening. (Patient taking differently: Inject 5 Units into the skin at bedtime.)   Insulin Syringe-Needle U-100 (INSULIN SYRINGE .3CC/31GX5/16") 31G X 5/16" 0.3 ML MISC Use as directed for Lantus injection once a day   levothyroxine (SYNTHROID, LEVOTHROID) 75 MCG tablet Take 75 mcg by mouth daily before breakfast.   losartan (COZAAR) 50 MG tablet Take 1 tablet (50 mg total) by mouth daily. (Patient taking differently: Take 50 mg by mouth at bedtime.)   NONFORMULARY OR COMPOUNDED ITEM [facility]:  Antifungal - Terbinafine 3%, Fluconazole 2%, Tea Tree Oil 5%, Urea 10%, Ibuprofen 2% in DMSO Suspension #49ml. Apply to the affected nail(s) once (at bedtime) or twice daily. (Patient taking differently: Apply 1  application  topically as needed (Toe nail fungus). [facility]:  Antifungal - Terbinafine 3%, Fluconazole 2%, Tea Tree Oil 5%, Urea 10%, Ibuprofen 2% in DMSO Suspension #24ml. Apply to the affected nail(s) as needed for fungus)   NOVOLOG FLEXPEN 100 UNIT/ML FlexPen Inject 3 Units into the skin 3 (three) times daily with meals. Sliding scale 101-120=1 unit 121-150=2 units 150-200=3 units 201-250= 4 units 251-300=5 units Over 300= units   OneTouch Delica Lancets 99991111 MISC Use to test blood sugar 4 times a day (Dx: E11.65)   Pioglitazone HCl (ACTOS PO) Take 45 mg by mouth at bedtime.    Polyethyl Glycol-Propyl Glycol (SYSTANE) 0.4-0.3 % GEL ophthalmic gel Place 1 application into both eyes at bedtime as needed (FOR EYES).   No facility-administered encounter medications on file as of 01/02/2023.    Allergies (verified) Patient has no known allergies.   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   Carotid artery disease 03/21/2017   Cataract    surgery to remove   Chest pain summer 2012   Chicken pox    CKD (chronic kidney disease) 10/08/2012   Stage 3, GFR 30-59 ml/min   Diverticulosis    difficulty with colonoscopy 2005   DM (diabetes mellitus), type 2 with renal complications 99991111   Fracture    radial/ulnar 03/2016   GERD (gastroesophageal reflux disease)    Heart murmur    innocent murmur   High cholesterol    Hypertension associated with diabetes 02/22/2017   Hyperuricemia 10/31/2016   Hypothyroidism    s/p radioactive iodine tx for graves in 2001   Intestinal metaplasia of gastric mucosa    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   Neuromuscular disorder 08/08/2016   Parkinson's   UTI (urinary tract infection)    Vitamin D deficiency 08/24/2012   Weight loss    eval with GI in 2016; s/p CT/MRI and PET scans   Past Surgical History:  Procedure Laterality Date   ABDOMINAL HYSTERECTOMY  1989   CATARACT EXTRACTION, BILATERAL     COLONOSCOPY  07/28/2016   x several, last one in 2017   DG BARIUM SWALLOW ([place] HX)     9 yrs ago per pt due to incomplete colonsocopy   RADIOLOGY WITH ANESTHESIA Left 11/18/2022   Procedure: Left Renal Cryoablation;  Surgeon: Sandi Mariscal, MD;  Location: [facility];  Service: Radiology;  Laterality: Left;   UPPER GI ENDOSCOPY  07/28/2016   Family History  Problem Relation Age of Onset   Cancer Mother        liver   Liver cancer Mother    Stroke Father 56   Diabetes Father    Hypertension Father    Diabetes Maternal Grandmother    Prostate cancer  Maternal Grandfather    Pancreatic cancer Brother    Diabetes Sister    Cancer Sister    Diabetes Sister    Colon polyps Neg Hx    Colon cancer Neg Hx    Esophageal cancer Neg Hx    Rectal cancer Neg Hx    Stomach cancer Neg Hx    Social History   Socioeconomic History   Marital status: Single    Spouse name: Not on file   Number of children: 1   Years of education: 20   Highest education level: Professional school degree (e.g., MD, DDS, DVM, JD)  Occupational History   Occupation: retired    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[employer], histology, anatomy and physiology  Tobacco Use   Smoking status: Never   Smokeless tobacco: Never  Vaping Use   Vaping Use: Never used  Substance and Sexual Activity   Alcohol use: No    Alcohol/week: 0.0 standard drinks of alcohol   Drug use: No   Sexual activity: Not Currently    Birth control/protection: Surgical    Comment: ABD Hysterectomy  Other Topics Concern   Not on file  Social History Narrative   Work: recently retired Firefighter, 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 Situation:  Lives alone, no family around, good friendsSpiritual Beliefs: christian, very involved with her church - [employer] churchLifestyle: exercising 5 days per week, trying to eat healthy   Caffeine 1/2 cup daily   Social Determinants of Health   Financial Resource Strain: Low Risk  (01/02/2023)   Overall Financial Resource Strain (CARDIA)    Difficulty of Paying Living Expenses: Not hard at all  Food Insecurity: No Food Insecurity (01/02/2023)   Hunger Vital Sign    Worried About Running Out of Food in the Last Year: Never true    [place] in the Last Year: Never true  Transportation Needs: No Transportation Needs (01/02/2023)   PRAPARE - Hydrologist (Medical): No    Lack of Transportation (Non-Medical): No  Physical Activity: Inactive (01/02/2023)   Exercise Vital Sign    Days of Exercise per Week: 0  days    Minutes of Exercise per Session: 0 min  Stress: No Stress Concern Present (01/02/2023)   [place]    Feeling of Stress : Not at all  Social Connections: Moderately Integrated (12/29/2021)   Social Connection and Isolation Panel [NHANES]    Frequency of Communication with Friends and Family: More than three times a week    Frequency of Social Gatherings with Friends and Family: More than three times a week    Attends Religious Services: More than 4 times per year    Active Member of [employer] or Organizations: Yes    Attends Music therapist: More than 4 times per year    Marital Status: Never married    Tobacco Counseling Counseling given: Not Answered   Clinical Intake:  Pre-visit preparation completed: Yes  Pain : No/denies pain     Nutritional Status: BMI of 19-24  Normal Nutritional Risks: None Diabetes: Yes  How often do you need to have someone help you when you read instructions, pamphlets, or other written materials from your doctor or pharmacy?: 1 - Never  Diabetic? Yes Nutrition Risk Assessment:  Has the patient had any N/V/D within the last 2 months?  No  Does the patient have any non-healing wounds?  No  Has the patient had any unintentional weight loss or weight gain?  No   Diabetes:  Is the patient diabetic?  Yes  If diabetic, was a CBG obtained today?  No  Did the patient bring in their glucometer from home?  No  How often do you monitor your CBG's? 4 times daily.   Financial Strains and Diabetes Management:  Are you having any financial strains with the device, your supplies or your medication? No .  Does the patient want to be seen by Chronic Care Management for management of their diabetes?  No  Would the patient like to be referred to a Nutritionist or for Diabetic Management?  No   Diabetic Exams:  Diabetic Eye Exam: Completed 12/19/2022 Diabetic Foot Exam:  Overdue, Pt has been advised about the importance in completing this exam. Pt is scheduled for diabetic foot exam on next appointment.   Interpreter Needed?: No  Information entered by :: NAllen LPN   Activities of Daily Living    01/02/2023    4:14 PM 11/18/2022    7:36 AM  In your present state of health, do you have any difficulty performing the following activities:  Hearing? 0   Vision? 0   Difficulty concentrating or making decisions? 1   Comment trouble with names   Walking or climbing stairs? 0   Dressing or bathing? 0   Doing errands, shopping? 0 0  Preparing Food and eating ? N   Using the Toilet? N   In the past six months, have you accidently leaked urine? Y   Do you have problems with loss of bowel control? N   Managing your Medications? N   Managing your Finances? N   Housekeeping or managing your Housekeeping? N     Patient Care Team: Farrel Conners, MD as PCP - General (Family Medicine) Lorretta Harp, MD as PCP - Cardiology (Cardiology) Ardis Hughs, MD as Attending Physician (Urology) Armbruster, Carlota Raspberry, MD as Consulting Physician (Gastroenterology) Altheimer, Legrand Como, MD as Referring Physician (Endocrinology) [name], Eustace Quail, DO as Consulting Physician (Neurology) Viona Gilmore, [facility] (Inactive) as Pharmacist (Pharmacist) Neldon Labella, RN as Case Manager  Indicate any recent Medical Services you may have received from other than [facility] providers in the past year (date may be approximate).     Assessment:   This is a routine wellness examination for Pamela Patton.  Hearing/Vision screen Vision Screening - Comments:: Regular eye exams, [facility]  Dietary issues and exercise activities discussed: Current Exercise Habits: The patient does not participate in regular exercise at present   Goals Addressed             This Visit's Progress    Patient Stated       01/02/2023, wants to start walking on a regular basis        Depression Screen    01/02/2023    4:13 PM 12/29/2021    1:31 PM 11/15/2021   10:17 AM 11/01/2021   11:13 AM 06/30/2020   11:30 AM 08/20/2019    1:03 PM 05/08/2018    9:56 AM  PHQ 2/9 Scores  PHQ - 2 Score 0 0 2 2 0 1 0  PHQ- 9 Score  0 7 8       Fall Risk    01/02/2023    4:12 PM 10/24/2022    8:22 AM 02/21/2022    2:08 PM 12/29/2021    1:36 PM 11/15/2021   10:09 AM  Fall Risk   Falls in the past year? 1 0 0 0 1  Comment fell on the stairs      Number falls in past yr: 0 0 0 0 0  Injury with Fall? 0 0 0 0 1  Risk for fall due to : Impaired mobility;Impaired balance/gait;Medication side effect   No Fall Risks History of fall(s);Impaired balance/gait  Follow up Falls prevention discussed;Education provided;Falls evaluation completed Falls evaluation completed   Education provided;Falls prevention discussed    FALL RISK PREVENTION PERTAINING TO THE HOME:  Any stairs in or around the home? Yes  If so, are there any without handrails? No  Home free of loose throw rugs  in walkways, pet beds, electrical cords, etc? Yes  Adequate lighting in your home to reduce risk of falls? Yes   ASSISTIVE DEVICES UTILIZED TO PREVENT FALLS:  Life alert? No  Use of a cane, walker or w/c? No  Grab bars in the bathroom? Yes  Shower chair or bench in shower? Yes  Elevated toilet seat or a handicapped toilet? Yes   TIMED UP AND GO:  Was the test performed? No .      Cognitive Function:      10/24/2022    8:00 AM  Montreal Cognitive Assessment   Visuospatial/ Executive (0/5) 5  Naming (0/3) 3  Attention: Read list of digits (0/2) 1  Attention: Read list of letters (0/1) 1  Attention: Serial 7 subtraction starting at 100 (0/3) 1  Language: Repeat phrase (0/2) 2  Language : Fluency (0/1) 0  Abstraction (0/2) 2  Delayed Recall (0/5) 3  Orientation (0/6) 6  Total 24  Adjusted Score (based on education) 24      01/02/2023    4:17 PM 12/29/2021    1:38 PM 06/30/2020   11:43 AM 05/04/2017     9:47 AM  6CIT Screen  What Year? 0 points 0 points 0 points 0 points  What month? 0 points 0 points 0 points 0 points  What time? 0 points 0 points  0 points  Count back from 20 0 points 0 points 0 points 0 points  Months in reverse 2 points 0 points 0 points 0 points  Repeat phrase 8 points 0 points 2 points 0 points  Total Score 10 points 0 points  0 points    Immunizations Immunization History  Administered Date(s) Administered   Fluad Quad(high Dose 65+) 08/11/2022   Influenza Whole 07/03/2012   Influenza, High Dose Seasonal PF 08/01/2017, 08/23/2018, 06/21/2019   Influenza-Unspecified 07/04/2015, 07/04/2016, 07/03/2017, 08/21/2018, 06/21/2019, 07/03/2021   PFIZER(Purple Top)SARS-COV-2 Vaccination 10/23/2019, 11/11/2019, 08/02/2020   PNEUMOCOCCAL CONJUGATE-20 11/01/2021   Pfizer Covid-19 Vaccine Bivalent Booster 17yrs & up 09/20/2021   Pneumococcal Conjugate-13 06/17/2014   Pneumococcal Polysaccharide-23 10/03/2009, 02/01/2011   Tdap 03/03/2009, 03/12/2016   Zoster, Live 01/31/2010    TDAP status: Up to date  Flu Vaccine status: Up to date  Pneumococcal vaccine status: Up to date  Covid-19 vaccine status: Completed vaccines  Qualifies for Shingles Vaccine? Yes   Zostavax completed Yes   Shingrix Completed?: No.    Education has been provided regarding the importance of this vaccine. Patient has been advised to call insurance company to determine out of pocket expense if they have not yet received this vaccine. Advised may also receive vaccine at local pharmacy or Health Dept. Verbalized acceptance and understanding.  Screening Tests Health Maintenance  Topic Date Due   Zoster Vaccines- Shingrix (1 of 2) Never done   FOOT EXAM  11/24/2020   COVID-19 Vaccine (6 - 2023-24 season) 06/03/2022   HEMOGLOBIN A1C  11/26/2022   Medicare Annual Wellness (AWV)  12/30/2022   Diabetic kidney evaluation - Urine ACR  01/29/2023   INFLUENZA VACCINE  05/04/2023   Diabetic kidney  evaluation - eGFR measurement  11/19/2023   OPHTHALMOLOGY EXAM  12/19/2023   DTaP/Tdap/Td (3 - Td or Tdap) 03/12/2026   Pneumonia Vaccine 55+ Years old  Completed   DEXA SCAN  Completed   Hepatitis C Screening  Completed   HPV VACCINES  Aged Out   COLONOSCOPY (Pts 45-62yrs Insurance coverage will need to be confirmed)  Discontinued    Health Maintenance  Health Maintenance Due  Topic Date Due   Zoster Vaccines- Shingrix (1 of 2) Never done   FOOT EXAM  11/24/2020   COVID-19 Vaccine (6 - 2023-24 season) 06/03/2022   HEMOGLOBIN A1C  11/26/2022   Medicare Annual Wellness (AWV)  12/30/2022   Diabetic kidney evaluation - Urine ACR  01/29/2023    Colorectal cancer screening: No longer required.   Mammogram status: Completed 02/15/2022. Repeat every year  Bone Density status: Completed 07/31/2017.   Lung Cancer Screening: (Low Dose CT Chest recommended if Age 63-80 years, 30 pack-year currently smoking OR have quit w/in 15years.) does not qualify.   Lung Cancer Screening Referral: no  Additional Screening:  Hepatitis C Screening: does qualify; Completed 04/18/2016  Vision Screening: Recommended annual ophthalmology exams for early detection of glaucoma and other disorders of the eye. Is the patient up to date with their annual eye exam?  Yes  Who is the provider or what is the name of the office in which the patient attends annual eye exams? [facility] If pt is not established with a provider, would they like to be referred to a provider to establish care? No .   Dental Screening: Recommended annual dental exams for proper oral hygiene  Community Resource Referral / Chronic Care Management: CRR required this visit?  No   CCM required this visit?  No      Plan:     I have personally reviewed and noted the following in the patient's chart:   Medical and social history Use of alcohol, tobacco or illicit drugs  Current medications and supplements including opioid  prescriptions. Patient is not currently taking opioid prescriptions. Functional ability and status Nutritional status Physical activity Advanced directives List of other physicians Hospitalizations, surgeries, and ER visits in previous 12 months Vitals Screenings to include cognitive, depression, and falls Referrals and appointments  In addition, I have reviewed and discussed with patient certain preventive protocols, quality metrics, and best practice recommendations. A written personalized care plan for preventive services as well as general preventive health recommendations were provided to patient.     Kellie Simmering, LPN   [id number]   Nurse Notes: none  Due to this being a virtual visit, the after visit summary with patients personalized plan was offered to patient via mail or my-chart. Patient would like to access on my-chart

## 2023-01-02 NOTE — Patient Instructions (Signed)
Pamela Patton , Thank you for taking time to come for your Medicare Wellness Visit. I appreciate your ongoing commitment to your health goals. Please review the following plan we discussed and let me know if I can assist you in the future.   These are the goals we discussed:  Goals       patient centered      Get her home more organized Finding time is an issue  Uses her strengths she dev during fall to help her now       Patient Stated (pt-stated)      Modify your diet and eat better foods.        Patient Stated      Eating more vegan like foods       Patient Stated      01/02/2023, wants to start walking on a regular basis      Track and Manage My Blood Pressure-Hypertension      Timeframe:  Long-Range Goal Priority:  High Start Date:                             Expected End Date:                       Follow Up Date 02/14/22    - check blood pressure weekly - choose a place to take my blood pressure (home, clinic or office, retail store) - write blood pressure results in a log or diary    Why is this important?   You won't feel high blood pressure, but it can still hurt your blood vessels.  High blood pressure can cause heart or kidney problems. It can also cause a stroke.  Making lifestyle changes like losing a little weight or eating less salt will help.  Checking your blood pressure at home and at different times of the day can help to control blood pressure.  If the doctor prescribes medicine remember to take it the way the doctor ordered.  Call the office if you cannot afford the medicine or if there are questions about it.     Notes:         This is a list of the screening recommended for you and due dates:  Health Maintenance  Topic Date Due   Zoster (Shingles) Vaccine (1 of 2) Never done   Complete foot exam   11/24/2020   COVID-19 Vaccine (6 - 2023-24 season) 06/03/2022   Hemoglobin A1C  11/26/2022   Yearly kidney health urinalysis for diabetes  01/29/2023    Flu Shot  05/04/2023   Yearly kidney function blood test for diabetes  11/19/2023   Eye exam for diabetics  12/19/2023   Medicare Annual Wellness Visit  01/02/2024   DTaP/Tdap/Td vaccine (3 - Td or Tdap) 03/12/2026   Pneumonia Vaccine  Completed   DEXA scan (bone density measurement)  Completed   Hepatitis C Screening: USPSTF Recommendation to screen - Ages 41-79 yo.  Completed   HPV Vaccine  Aged Out   Colon Cancer Screening  Discontinued    Advanced directives: Please bring a copy of your POA (Power of Attorney) and/or Living Will to your next appointment.   Conditions/risks identified: none  Next appointment: Follow up in one year for your annual wellness visit    Preventive Care 65 Years and Older, Female Preventive care refers to lifestyle choices and visits with your health care provider that can  promote health and wellness. What does preventive care include? A yearly physical exam. This is also called an annual well check. Dental exams once or twice a year. Routine eye exams. Ask your health care provider how often you should have your eyes checked. Personal lifestyle choices, including: Daily care of your teeth and gums. Regular physical activity. Eating a healthy diet. Avoiding tobacco and drug use. Limiting alcohol use. Practicing safe sex. Taking low-dose aspirin every day. Taking vitamin and mineral supplements as recommended by your health care provider. What happens during an annual well check? The services and screenings done by your health care provider during your annual well check will depend on your age, overall health, lifestyle risk factors, and family history of disease. Counseling  Your health care provider may ask you questions about your: Alcohol use. Tobacco use. Drug use. Emotional well-being. Home and relationship well-being. Sexual activity. Eating habits. History of falls. Memory and ability to understand (cognition). Work and work  Statistician. Reproductive health. Screening  You may have the following tests or measurements: Height, weight, and BMI. Blood pressure. Lipid and cholesterol levels. These may be checked every 5 years, or more frequently if you are over 44 years old. Skin check. Lung cancer screening. You may have this screening every year starting at age 25 if you have a 30-pack-year history of smoking and currently smoke or have quit within the past 15 years. Fecal occult blood test (FOBT) of the stool. You may have this test every year starting at age 49. Flexible sigmoidoscopy or colonoscopy. You may have a sigmoidoscopy every 5 years or a colonoscopy every 10 years starting at age 26. Hepatitis C blood test. Hepatitis B blood test. Sexually transmitted disease (STD) testing. Diabetes screening. This is done by checking your blood sugar (glucose) after you have not eaten for a while (fasting). You may have this done every 1-3 years. Bone density scan. This is done to screen for osteoporosis. You may have this done starting at age 1. Mammogram. This may be done every 1-2 years. Talk to your health care provider about how often you should have regular mammograms. Talk with your health care provider about your test results, treatment options, and if necessary, the need for more tests. Vaccines  Your health care provider may recommend certain vaccines, such as: Influenza vaccine. This is recommended every year. Tetanus, diphtheria, and acellular pertussis (Tdap, Td) vaccine. You may need a Td booster every 10 years. Zoster vaccine. You may need this after age 78. Pneumococcal 13-valent conjugate (PCV13) vaccine. One dose is recommended after age 99. Pneumococcal polysaccharide (PPSV23) vaccine. One dose is recommended after age 57. Talk to your health care provider about which screenings and vaccines you need and how often you need them. This information is not intended to replace advice given to you by  your health care provider. Make sure you discuss any questions you have with your health care provider. Document Released: 10/16/2015 Document Revised: 06/08/2016 Document Reviewed: 07/21/2015 Elsevier Interactive Patient Education  2017 [employer] Prevention in the Home Falls can cause injuries. They can happen to people of all ages. There are many things you can do to make your home safe and to help prevent falls. What can I do on the outside of my home? Regularly fix the edges of walkways and driveways and fix any cracks. Remove anything that might make you trip as you walk through a door, such as a raised step or threshold. Trim any bushes  or trees on the path to your home. Use bright outdoor lighting. Clear any walking paths of anything that might make someone trip, such as rocks or tools. Regularly check to see if handrails are loose or broken. Make sure that both sides of any steps have handrails. Any raised decks and porches should have guardrails on the edges. Have any leaves, snow, or ice cleared regularly. Use sand or salt on walking paths during winter. Clean up any spills in your garage right away. This includes oil or grease spills. What can I do in the bathroom? Use night lights. Install grab bars by the toilet and in the tub and shower. Do not use towel bars as grab bars. Use non-skid mats or decals in the tub or shower. If you need to sit down in the shower, use a plastic, non-slip stool. Keep the floor dry. Clean up any water that spills on the floor as soon as it happens. Remove soap buildup in the tub or shower regularly. Attach bath mats securely with double-sided non-slip rug tape. Do not have throw rugs and other things on the floor that can make you trip. What can I do in the bedroom? Use night lights. Make sure that you have a light by your bed that is easy to reach. Do not use any sheets or blankets that are too big for your bed. They should not hang  down onto the floor. Have a firm chair that has side arms. You can use this for support while you get dressed. Do not have throw rugs and other things on the floor that can make you trip. What can I do in the kitchen? Clean up any spills right away. Avoid walking on wet floors. Keep items that you use a lot in easy-to-reach places. If you need to reach something above you, use a strong step stool that has a grab bar. Keep electrical cords out of the way. Do not use floor polish or wax that makes floors slippery. If you must use wax, use non-skid floor wax. Do not have throw rugs and other things on the floor that can make you trip. What can I do with my stairs? Do not leave any items on the stairs. Make sure that there are handrails on both sides of the stairs and use them. Fix handrails that are broken or loose. Make sure that handrails are as long as the stairways. Check any carpeting to make sure that it is firmly attached to the stairs. Fix any carpet that is loose or worn. Avoid having throw rugs at the top or bottom of the stairs. If you do have throw rugs, attach them to the floor with carpet tape. Make sure that you have a light switch at the top of the stairs and the bottom of the stairs. If you do not have them, ask someone to add them for you. What else can I do to help prevent falls? Wear shoes that: Do not have high heels. Have rubber bottoms. Are comfortable and fit you well. Are closed at the toe. Do not wear sandals. If you use a stepladder: Make sure that it is fully opened. Do not climb a closed stepladder. Make sure that both sides of the stepladder are locked into place. Ask someone to hold it for you, if possible. Clearly mark and make sure that you can see: Any grab bars or handrails. First and last steps. Where the edge of each step is. Use tools that help you  move around (mobility aids) if they are needed. These  include: Canes. Walkers. Scooters. Crutches. Turn on the lights when you go into a dark area. Replace any light bulbs as soon as they burn out. Set up your furniture so you have a clear path. Avoid moving your furniture around. If any of your floors are uneven, fix them. If there are any pets around you, be aware of where they are. Review your medicines with your doctor. Some medicines can make you feel dizzy. This can increase your chance of falling. Ask your doctor what other things that you can do to help prevent falls. This information is not intended to replace advice given to you by your health care provider. Make sure you discuss any questions you have with your health care provider. Document Released: 07/16/2009 Document Revised: 02/25/2016 Document Reviewed: 10/24/2014 Elsevier Interactive Patient Education  2017 [employer].

## 2023-01-09 ENCOUNTER — Other Ambulatory Visit: Payer: Self-pay | Admitting: Family Medicine

## 2023-01-14 ENCOUNTER — Other Ambulatory Visit: Payer: Self-pay | Admitting: Interventional Radiology

## 2023-01-14 DIAGNOSIS — N2889 Other specified disorders of kidney and ureter: Secondary | ICD-10-CM

## 2023-01-18 ENCOUNTER — Emergency Department (HOSPITAL_BASED_OUTPATIENT_CLINIC_OR_DEPARTMENT_OTHER)
Admission: EM | Admit: 2023-01-18 | Discharge: 2023-01-18 | Disposition: A | Discharge status: Home / Self Care | Payer: Medicare PPO | Attending: Emergency Medicine | Admitting: Emergency Medicine

## 2023-01-18 ENCOUNTER — Encounter (HOSPITAL_BASED_OUTPATIENT_CLINIC_OR_DEPARTMENT_OTHER): Payer: Self-pay

## 2023-01-18 ENCOUNTER — Other Ambulatory Visit: Payer: Self-pay

## 2023-01-18 ENCOUNTER — Emergency Department (HOSPITAL_BASED_OUTPATIENT_CLINIC_OR_DEPARTMENT_OTHER): Payer: Medicare PPO

## 2023-01-18 DIAGNOSIS — E119 Type 2 diabetes mellitus without complications: Secondary | ICD-10-CM | POA: Insufficient documentation

## 2023-01-18 DIAGNOSIS — N183 Chronic kidney disease, stage 3 unspecified: Secondary | ICD-10-CM | POA: Diagnosis not present

## 2023-01-18 DIAGNOSIS — Z79899 Other long term (current) drug therapy: Secondary | ICD-10-CM | POA: Diagnosis not present

## 2023-01-18 DIAGNOSIS — S00512A Abrasion of oral cavity, initial encounter: Secondary | ICD-10-CM | POA: Diagnosis not present

## 2023-01-18 DIAGNOSIS — Z794 Long term (current) use of insulin: Secondary | ICD-10-CM | POA: Diagnosis not present

## 2023-01-18 DIAGNOSIS — Z85528 Personal history of other malignant neoplasm of kidney: Secondary | ICD-10-CM | POA: Diagnosis not present

## 2023-01-18 DIAGNOSIS — S0990XA Unspecified injury of head, initial encounter: Secondary | ICD-10-CM | POA: Diagnosis not present

## 2023-01-18 DIAGNOSIS — W19XXXA Unspecified fall, initial encounter: Secondary | ICD-10-CM | POA: Insufficient documentation

## 2023-01-18 DIAGNOSIS — E039 Hypothyroidism, unspecified: Secondary | ICD-10-CM | POA: Diagnosis not present

## 2023-01-18 DIAGNOSIS — Z7982 Long term (current) use of aspirin: Secondary | ICD-10-CM | POA: Insufficient documentation

## 2023-01-18 DIAGNOSIS — I129 Hypertensive chronic kidney disease with stage 1 through stage 4 chronic kidney disease, or unspecified chronic kidney disease: Secondary | ICD-10-CM | POA: Diagnosis not present

## 2023-01-18 DIAGNOSIS — S0993XA Unspecified injury of face, initial encounter: Secondary | ICD-10-CM | POA: Diagnosis present

## 2023-01-18 DIAGNOSIS — S00511A Abrasion of lip, initial encounter: Secondary | ICD-10-CM | POA: Diagnosis not present

## 2023-01-18 MED ORDER — ACETAMINOPHEN 325 MG PO TABS
650.0000 mg | ORAL_TABLET | Freq: Once | ORAL | Status: AC
  Administered 2023-01-18: 650 mg via ORAL
  Filled 2023-01-18: qty 2

## 2023-01-18 NOTE — Discharge Instructions (Addendum)
Your CT scan today did not show any fracture, dislocation or brain bleeding.  Please tylenol/ibuprofen for pain. Please use a gauge to control bleeding between your front teeth and lower lip. I recommend close follow-up with PCP for reevaluation.  Please do not hesitate to return to emergency department if worrisome signs symptoms we discussed become apparent.

## 2023-01-18 NOTE — ED Notes (Signed)
Discharge paperwork given and verbally understood. 

## 2023-01-18 NOTE — ED Triage Notes (Addendum)
Patient here POV from Home.  Endorses Fall today when she was ambulating up slope and tripped over her foot. Fell onto [employer] and injured face. Laceration noted to Interior Lower Left Lip.   No Anticoagulants. No LOC. Mostly facial injury. Seen at [facility] and sent for Evaluation. No Loose Teeth noted by Patient or this RN in Triage.   NA noted during triage. A&Ox4. GCS 15. BIB Wheelchair.

## 2023-01-18 NOTE — ED Provider Notes (Signed)
[place] EMERGENCY DEPARTMENT AT [facility] Provider Note   CSN: [id number] Arrival date & time: 01/18/23  1239     History  Chief Complaint  Patient presents with   Pamela Patton is a 77 y.o. female with a past medical history as below presents today for evaluation after fall.  Patient had a mechanical fall earlier today when she tripped, hit her face on concrete.  She has a laceration/skin abrasion in the area between her front teeth and lower lip.  Bleeding is controlled in triage.  Patient is ambulatory after the fall.  She denies any LOC, nausea, vomiting, headache, dizziness.  Denies any chest pain, abdominal pain, pelvic pain, leg or arm pain.     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   Fracture    radial/ulnar 03/2016   GERD (gastroesophageal reflux disease)    Heart murmur    innocent murmur   High cholesterol    Hypertension associated with diabetes 02/22/2017   Hyperuricemia 10/31/2016   Hypothyroidism    s/p radioactive iodine tx for graves in 2001   Intestinal metaplasia of gastric mucosa    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   Neuromuscular disorder 08/08/2016   Parkinson's   UTI (urinary tract infection)    Vitamin D deficiency 08/24/2012   Weight loss    eval with GI in 2016; s/p CT/MRI and PET scans   Past Surgical History:  Procedure Laterality Date   ABDOMINAL HYSTERECTOMY  1989   CATARACT EXTRACTION, BILATERAL     COLONOSCOPY  07/28/2016   x several, last one in 2017   DG BARIUM SWALLOW ([facility] HX)     9 yrs ago per pt due to incomplete colonsocopy   RADIOLOGY  WITH ANESTHESIA Left 11/18/2022   Procedure: Left Renal Cryoablation;  Surgeon: Simonne Come, MD;  Location: [facility] OR;  Service: Radiology;  Laterality: Left;   UPPER GI ENDOSCOPY  07/28/2016     Home Medications Prior to Admission medications   Medication Sig Start Date End Date Taking? Authorizing Provider  aspirin 81 MG tablet Take 1 tablet (81 mg total) by mouth at bedtime. Ok to restart 11/20/22 11/19/22   [name], NP  atorvastatin (LIPITOR) 40 MG tablet Take 40 mg by mouth at bedtime.    [provider]  Blood Pressure Monitoring (BLOOD PRESSURE CUFF) MISC 1 Product by Does not apply route as needed. 05/21/19   Terressa Koyanagi, DO  Calcium Carbonate-Vitamin D 600-400 MG-UNIT tablet Take 1 tablet by mouth 3 (three) times a week.     [provider]  carbidopa-levodopa (SINEMET IR) 25-100 MG tablet TAKE TWO TABLETS BY MOUTH EVERY MORNING and TAKE TWO TABLETS BY MOUTH AT NOON and TAKE TWO TABLETS BY MOUTH EVERYDAY AT BEDTIME 11/07/22   [name], [name], DO  Cholecalciferol (VITAMIN D PO) Take 5,000 Units by mouth 3 (three) times a week.     [provider]  Cyanocobalamin (VITAMIN B12 PO) Take 5,000 mcg by mouth 2 (two) times a week.  [provider]  glucose 4 GM chewable tablet Chew 1 tablet by mouth as needed for low blood sugar.    [provider]  glucose blood (ACCU-CHEK AVIVA PLUS) test strip Use to test blood sugar 4 times a day (Dx: E11.65) 12/25/19   [provider]  hydrochlorothiazide (HYDRODIURIL) 25 MG tablet Take 25 mg by mouth at bedtime. 06/29/12   [provider]  insulin glargine (LANTUS) 100 UNIT/ML injection Inject 0.05 mLs (5 Units total) into the skin every evening. Patient taking differently: Inject 5 Units into the skin at bedtime. 11/01/21   [name], MD  Insulin Syringe-Needle U-100 (INSULIN SYRINGE .3CC/31GX5/16") 31G X 5/16" 0.3 ML MISC Use as directed for Lantus injection once a day 12/25/19    [provider]  levothyroxine (SYNTHROID, LEVOTHROID) 75 MCG tablet Take 75 mcg by mouth daily before breakfast.    [provider]  losartan (COZAAR) 50 MG tablet TAKE ONE TABLET BY MOUTH EVERY EVENING 01/10/23   [name], MD  NONFORMULARY OR COMPOUNDED ITEM [facility]:  Antifungal - Terbinafine 3%, Fluconazole 2%, Tea Tree Oil 5%, Urea 10%, Ibuprofen 2% in DMSO Suspension #61ml. Apply to the affected nail(s) once (at bedtime) or twice daily. Patient taking differently: Apply 1 application  topically as needed (Toe nail fungus). [facility]:  Antifungal - Terbinafine 3%, Fluconazole 2%, Tea Tree Oil 5%, Urea 10%, Ibuprofen 2% in DMSO Suspension #65ml. Apply to the affected nail(s) as needed for fungus 10/31/18   [name], [name], [name]  NOVOLOG FLEXPEN 100 UNIT/ML FlexPen Inject 3 Units into the skin 3 (three) times daily with meals. Sliding scale 101-120=1 unit 121-150=2 units 150-200=3 units 201-250= 4 units 251-300=5 units Over 300= units 11/09/22   [provider]  OneTouch Delica Lancets 33G MISC Use to test blood sugar 4 times a day (Dx: E11.65) 12/25/19   [provider]  Pioglitazone HCl (ACTOS PO) Take 45 mg by mouth at bedtime.    [provider]  Polyethyl Glycol-Propyl Glycol (SYSTANE) 0.4-0.3 % GEL ophthalmic gel Place 1 application into both eyes at bedtime as needed (FOR EYES).    [provider]      Allergies    Patient has no known allergies.    Review of Systems   Review of Systems Negative except as per HPI.  Physical Exam Updated Vital Signs BP (!) 191/74 (BP Location: Right Arm)   Pulse 60   Temp (!) 97.4 F (36.3 C) (Temporal)   Resp 16   Ht 5\' 6"  (1.676 m)   Wt 60.3 kg   SpO2 93%   BMI 21.46 kg/m  Physical Exam Vitals and nursing note reviewed.  Constitutional:      Appearance: Normal appearance.  HENT:     Head: Normocephalic and atraumatic.     Mouth/Throat:     Mouth:  Mucous membranes are moist.     Comments: Some skin abrasion to the area between the front teeth and lower lip. Bleeding is controlled. Eyes:     General: No scleral icterus. Cardiovascular:     Rate and Rhythm: Normal rate and regular rhythm.     Pulses: Normal pulses.     Heart sounds: Normal heart sounds.  Pulmonary:     Effort: Pulmonary effort is normal.     Breath sounds: Normal breath sounds.  Abdominal:     General: Abdomen is flat.     Palpations: Abdomen is soft.     Tenderness: There is  no abdominal tenderness.  Musculoskeletal:        General: No deformity.  Skin:    General: Skin is warm.     Findings: No rash.  Neurological:     General: No focal deficit present.     Mental Status: She is alert.  Psychiatric:        Mood and Affect: Mood normal.     ED Results / Procedures / Treatments   Labs (all labs ordered are listed, but only abnormal results are displayed) Labs Reviewed - No data to display  EKG None  Radiology No results found.  Procedures Procedures    Medications Ordered in ED Medications  acetaminophen (TYLENOL) tablet 650 mg (650 mg Oral Given 01/18/23 1540)    ED Course/ Medical Decision Making/ A&P                             Medical Decision Making Amount and/or Complexity of Data Reviewed Radiology: ordered.  Risk OTC drugs.   This patient presents to the ED for evaluation after fall, this involves an extensive number of treatment options, and is a complaint that carries with a high risk of complications and morbidity.  The differential diagnosis includes fracture, dislocation, bleed, laceration, hematoma.  This is not an exhaustive list.  Imaging studies: I ordered imaging studies. I personally reviewed, interpreted imaging and agree with the radiologist's interpretations. The results include: CT head, CT cervical spine, CT maxillofacial unremarkable.  Problem list/ ED course/ Critical interventions/ Medical  management: HPI: See above Vital signs within normal range and stable throughout visit. Laboratory/imaging studies significant for: See above. On physical examination, patient is afebrile and appears in no acute distress. 77 y/o patient presenting with head trauma. Given mechanism, history, and physical exam and  CT scan findings, I have a low suspicion for intracranial hemorrhage, basilar skull fracture. Patient's GCS is 15, there is no history of LOC . Patient is well appearing and tolerating PO with no other injuries.  She has some skin abrasion of the area between the front and lower lip.  Advised patient to use a gauze to control bleeding.  Bleeding has stopped on my reevaluation. Patient is safe for DC home at this time. Patient and family were advised to f/u with PCP and instructed on appropriate return precautions.  I have reviewed the patient home medicines and have made adjustments as needed.  Cardiac monitoring/EKG: The patient was maintained on a cardiac monitor.  I personally reviewed and interpreted the cardiac monitor which showed an underlying rhythm of: sinus rhythm.  Additional history obtained: External records from outside source obtained and reviewed including: Chart review including previous notes, labs, imaging.  Consultations obtained:  Disposition Continued outpatient therapy. Follow-up with PCP recommended for reevaluation of symptoms. Treatment plan discussed with patient.  Pt acknowledged understanding was agreeable to the plan. Worrisome signs and symptoms were discussed with patient, and patient acknowledged understanding to return to the ED if they noticed these signs and symptoms. Patient was stable upon discharge.   This chart was dictated using voice recognition software.  Despite best efforts to proofread,  errors can occur which can change the documentation meaning.          Final Clinical Impression(s) / ED Diagnoses Final diagnoses:  Fall, initial  encounter    Rx / DC Orders ED Discharge Orders     None         Jeanelle Malling,  PA 01/18/23 1810    Arby Barrette, MD 01/19/23 [phone]

## 2023-01-19 ENCOUNTER — Ambulatory Visit: Payer: Self-pay

## 2023-01-19 DIAGNOSIS — I10 Essential (primary) hypertension: Secondary | ICD-10-CM

## 2023-01-23 ENCOUNTER — Telehealth (INDEPENDENT_AMBULATORY_CARE_PROVIDER_SITE_OTHER): Payer: Medicare PPO | Admitting: Family Medicine

## 2023-01-23 ENCOUNTER — Encounter: Payer: Self-pay | Admitting: Family Medicine

## 2023-01-23 DIAGNOSIS — S00511D Abrasion of lip, subsequent encounter: Secondary | ICD-10-CM

## 2023-01-23 NOTE — Progress Notes (Signed)
Subjective:    Patient ID: Pamela Patton, female    DOB: 1946-02-02, 77 y.o.   MRN: [id number]  HPI Virtual Visit via Video Note  I connected with the patient on 01/23/23 at  1:00 PM EDT by a video enabled telemedicine application and verified that I am speaking with the correct person using two identifiers.  Location patient: home Location provider:work or home office Persons participating in the virtual visit: patient, provider  I discussed the limitations of evaluation and management by telemedicine and the availability of in person appointments. The patient expressed understanding and agreed to proceed.   HPI: Here to follow up an ED visit on 01-18-23. Prior to the visit she was walking into another medical office that morning, and she tripped over a curb. She fell forward and landed on her face. No LOC, and apparently no other injuries. At the ED she had an abrasion to the lower lip and there was a good bit of bleeding, but this was stopped. Otherwise her exam was unremarkable. Her teeth were intact. CT scan were obtained of the head, maxillofacial area, and the cervical spine, and these were all normal. She was sent home with gauze in place. Since then she has felt fine, and there is little pain. She is applying ice packs to the lip and cleaning it with saline.    ROS: See pertinent positives and negatives per HPI.  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   Fracture    radial/ulnar 03/2016   GERD (gastroesophageal reflux disease)    Heart murmur    innocent murmur   High cholesterol    Hypertension associated with  diabetes 02/22/2017   Hyperuricemia 10/31/2016   Hypothyroidism    s/p radioactive iodine tx for graves in 2001   Intestinal metaplasia of gastric mucosa    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   Neuromuscular disorder 08/08/2016   Parkinson's   UTI (urinary tract infection)    Vitamin D deficiency 08/24/2012   Weight loss    eval with GI in 2016; s/p CT/MRI and PET scans    Past Surgical History:  Procedure Laterality Date   ABDOMINAL HYSTERECTOMY  1989   CATARACT EXTRACTION, BILATERAL     COLONOSCOPY  07/28/2016   x several, last one in 2017   DG BARIUM SWALLOW ([facility] HX)     9 yrs ago per pt due to incomplete colonsocopy   RADIOLOGY WITH ANESTHESIA Left 11/18/2022   Procedure: Left Renal Cryoablation;  Surgeon: Simonne Come, MD;  Location: [facility];  Service: Radiology;  Laterality: Left;   UPPER GI ENDOSCOPY  07/28/2016    Family History  Problem Relation Age of Onset   Cancer Mother        liver   Liver cancer Mother    Stroke Father 19   Diabetes Father    Hypertension Father    Diabetes Maternal Grandmother    Prostate cancer Maternal Grandfather    Pancreatic cancer Brother    Diabetes Sister    Cancer Sister    Diabetes  Sister    Colon polyps Neg Hx    Colon cancer Neg Hx    Esophageal cancer Neg Hx    Rectal cancer Neg Hx    Stomach cancer Neg Hx      Current Outpatient Medications:    aspirin 81 MG tablet, Take 1 tablet (81 mg total) by mouth at bedtime. Ok to restart 11/20/22, Disp: 30 tablet, Rfl: 0   atorvastatin (LIPITOR) 40 MG tablet, Take 40 mg by mouth at bedtime., Disp: , Rfl:    Blood Pressure Monitoring (BLOOD PRESSURE CUFF) MISC, 1 Product by Does not apply route as needed., Disp: 1 each, Rfl: 0   Calcium Carbonate-Vitamin D 600-400 MG-UNIT tablet, Take 1 tablet by mouth 3 (three) times a week. , Disp: , Rfl:    carbidopa-levodopa (SINEMET IR) 25-100 MG tablet, TAKE TWO TABLETS BY MOUTH EVERY MORNING and TAKE TWO TABLETS BY MOUTH AT NOON and  TAKE TWO TABLETS BY MOUTH EVERYDAY AT BEDTIME, Disp: 540 tablet, Rfl: 0   Cholecalciferol (VITAMIN D PO), Take 5,000 Units by mouth 3 (three) times a week. , Disp: , Rfl:    Cyanocobalamin (VITAMIN B12 PO), Take 5,000 mcg by mouth 2 (two) times a week., Disp: , Rfl:    glucose 4 GM chewable tablet, Chew 1 tablet by mouth as needed for low blood sugar., Disp: , Rfl:    glucose blood (ACCU-CHEK AVIVA PLUS) test strip, Use to test blood sugar 4 times a day (Dx: E11.65), Disp: , Rfl:    hydrochlorothiazide (HYDRODIURIL) 25 MG tablet, Take 25 mg by mouth at bedtime., Disp: , Rfl:    insulin glargine (LANTUS) 100 UNIT/ML injection, Inject 0.05 mLs (5 Units total) into the skin every evening. (Patient taking differently: Inject 5 Units into the skin at bedtime.), Disp: 10 mL, Rfl:    Insulin Syringe-Needle U-100 (INSULIN SYRINGE .3CC/31GX5/16") 31G X 5/16" 0.3 ML MISC, Use as directed for Lantus injection once a day, Disp: , Rfl:    levothyroxine (SYNTHROID, LEVOTHROID) 75 MCG tablet, Take 75 mcg by mouth daily before breakfast., Disp: , Rfl:    losartan (COZAAR) 50 MG tablet, TAKE ONE TABLET BY MOUTH EVERY EVENING, Disp: 90 tablet, Rfl: 2   NONFORMULARY OR COMPOUNDED ITEM, [facility]:  Antifungal - Terbinafine 3%, Fluconazole 2%, Tea Tree Oil 5%, Urea 10%, Ibuprofen 2% in DMSO Suspension #92ml. Apply to the affected nail(s) once (at bedtime) or twice daily. (Patient taking differently: Apply 1 application  topically as needed (Toe nail fungus). [facility]:  Antifungal - Terbinafine 3%, Fluconazole 2%, Tea Tree Oil 5%, Urea 10%, Ibuprofen 2% in DMSO Suspension #38ml. Apply to the affected nail(s) as needed for fungus), Disp: 30 each, Rfl: 5   NOVOLOG FLEXPEN 100 UNIT/ML FlexPen, Inject 3 Units into the skin 3 (three) times daily with meals. Sliding scale 101-120=1 unit 121-150=2 units 150-200=3 units 201-250= 4 units 251-300=5 units Over 300= units, Disp: , Rfl:    OneTouch Delica Lancets 33G  MISC, Use to test blood sugar 4 times a day (Dx: E11.65), Disp: , Rfl:    Pioglitazone HCl (ACTOS PO), Take 45 mg by mouth at bedtime., Disp: , Rfl:    Polyethyl Glycol-Propyl Glycol (SYSTANE) 0.4-0.3 % GEL ophthalmic gel, Place 1 application into both eyes at bedtime as needed (FOR EYES)., Disp: , Rfl:   EXAM:  VITALS per patient if applicable:  GENERAL: alert, oriented, appears well and in no acute distress  HEENT: atraumatic, conjunttiva clear, no obvious abnormalities on inspection of  external nose and ears  NECK: normal movements of the head and neck  LUNGS: on inspection no signs of respiratory distress, breathing rate appears normal, no obvious gross SOB, gasping or wheezing  CV: no obvious cyanosis  MS: moves all visible extremities without noticeable abnormality  PSYCH/NEURO: pleasant and cooperative, no obvious depression or anxiety, speech and thought processing grossly intact  ASSESSMENT AND PLAN: Lip abrasion. She seems to be healing well. Follow up as needed. We spent a total of ( 31  ) minutes reviewing records and discussing these issues.  Gershon Crane, MD   Discussed the following assessment and plan:  No diagnosis found.     I discussed the assessment and treatment plan with the patient. The patient was provided an opportunity to ask questions and all were answered. The patient agreed with the plan and demonstrated an understanding of the instructions.   The patient was advised to call back or seek an in-person evaluation if the symptoms worsen or if the condition fails to improve as anticipated.      Review of Systems     Objective:   Physical Exam        Assessment & Plan:

## 2023-01-31 ENCOUNTER — Other Ambulatory Visit: Payer: Self-pay | Admitting: Internal Medicine

## 2023-01-31 DIAGNOSIS — M810 Age-related osteoporosis without current pathological fracture: Secondary | ICD-10-CM

## 2023-02-02 ENCOUNTER — Other Ambulatory Visit: Payer: Self-pay | Admitting: Neurology

## 2023-02-02 DIAGNOSIS — G20A1 Parkinson's disease without dyskinesia, without mention of fluctuations: Secondary | ICD-10-CM

## 2023-02-10 ENCOUNTER — Telehealth: Payer: Self-pay | Admitting: Family Medicine

## 2023-02-10 ENCOUNTER — Ambulatory Visit (HOSPITAL_COMMUNITY)
Admission: RE | Admit: 2023-02-10 | Discharge: 2023-02-10 | Disposition: A | Discharge status: Home / Self Care | Payer: Medicare PPO | Source: Ambulatory Visit | Attending: Interventional Radiology | Admitting: Interventional Radiology

## 2023-02-10 DIAGNOSIS — N2889 Other specified disorders of kidney and ureter: Secondary | ICD-10-CM | POA: Diagnosis present

## 2023-02-10 MED ORDER — GADOBUTROL 1 MMOL/ML IV SOLN
6.0000 mL | Freq: Once | INTRAVENOUS | Status: AC | PRN
  Administered 2023-02-10: 6 mL via INTRAVENOUS

## 2023-02-10 NOTE — Telephone Encounter (Addendum)
[facility] called to confirm receipt of a fax sent yesterday   Regarding:  Agency discharge   Fax was sent on 02/09/23  *Fax number confirmed  Please call Luther Parody [phone]  ext [phone]

## 2023-02-13 NOTE — Telephone Encounter (Signed)
Is this just an FYI or do I need to do something?

## 2023-02-14 NOTE — Telephone Encounter (Signed)
Form was faxed to [facility] at [phone] and sent to be scanned.

## 2023-02-14 NOTE — Telephone Encounter (Signed)
Signed the form that was in the red folder, ok to close task

## 2023-02-14 NOTE — Telephone Encounter (Signed)
[facility] call to FU, asking when Provider will be able to sign and return this form.   Please advise.

## 2023-02-16 ENCOUNTER — Ambulatory Visit
Admission: RE | Admit: 2023-02-16 | Discharge: 2023-02-16 | Disposition: A | Discharge status: Home / Self Care | Payer: Medicare PPO | Source: Ambulatory Visit | Attending: Interventional Radiology | Admitting: Interventional Radiology

## 2023-02-16 DIAGNOSIS — N2889 Other specified disorders of kidney and ureter: Secondary | ICD-10-CM

## 2023-02-16 NOTE — Progress Notes (Signed)
Patient ID: Pamela Patton, female   DOB: 04-29-1946, 77 y.o.   MRN: [id number]        Chief Complaint: Left sided RCC   Referring Physician(s): Herrick,Benjamin W [name], Lurena Joiner (Parkinson's physician)   History of Present Illness: Pamela Patton is a 77 y.o. female with complex past medical history significant for stage III chronic kidney disease, hypertension, hyperlipidemia, hypothyroidism and Parkinson disease who underwent technically successful left-sided renal cryoablation performed 11/18/2022.  She is seen today in telemedicine consultation following acquisition of initial postprocedural abdominal MRI performed 02/10/2023.  In brief review, the left-sided renal mass was initially discovered on abdominal CT performed on 06/16/2016 performed for weight loss and anemia.  At that time, an approximately 1.5 cm enhancing lesion was identified within the posterior inferior pole of the left kidney highly suspicious for renal cell carcinoma.  Subsequent abdominal MRI was performed on 06/21/2016 confirmed the lesion to be worrisome for a renal cell carcinoma.   Patient was subsequently referred to Dr. Marlou Porch who has been doing surveillance imaging of this lesion as the patient has been hesitant to undergo intervention given her medical comorbidities, primarily her Parkinson's disease.   Fortunately, her Parkinson disease has remained relatively stable for the past several years, but unfortunately the left-sided renal lesion has continued to demonstrate progressive growth for which the patient was seen in consultation on 10/21/2022, ultimately undergoing renal cryoablation on 11/18/2022.   The patient recovered largely uneventfully from the cryoablation.  Patient continues to report intermittent episodes of fatigue.  She also reports intermittent episodes of incontinence though states these were present prior to the procedure.  She denies flank pain.  No change in bowel habits.  No fever or chills.     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   Fracture    radial/ulnar 03/2016   GERD (gastroesophageal reflux disease)    Heart murmur    innocent murmur   High cholesterol    Hypertension associated with diabetes 02/22/2017   Hyperuricemia 10/31/2016   Hypothyroidism    s/p radioactive iodine tx for graves in 2001   Intestinal metaplasia of gastric mucosa    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   Neuromuscular disorder (HCC) 08/08/2016   Parkinson's   UTI (urinary tract infection)    Vitamin D deficiency 08/24/2012   Weight loss    eval with GI in 2016; s/p CT/MRI and PET scans    Past Surgical History:  Procedure Laterality Date   ABDOMINAL HYSTERECTOMY  1989   CATARACT EXTRACTION, BILATERAL     COLONOSCOPY  07/28/2016   x several, last one in 2017   DG BARIUM SWALLOW ([facility] HX)     9 yrs ago per pt due to incomplete colonsocopy   RADIOLOGY WITH ANESTHESIA Left 11/18/2022   Procedure: Left Renal Cryoablation;  Surgeon: Simonne Come, MD;  Location: [facility] OR;  Service: Radiology;  Laterality: Left;   UPPER GI ENDOSCOPY  07/28/2016    Allergies: Patient has no known allergies.  Medications: Prior to Admission medications   Medication Sig Start Date  End Date Taking? Authorizing Provider  aspirin 81 MG tablet Take 1 tablet (81 mg total) by mouth at bedtime. Ok to restart 11/20/22 11/19/22   [name], NP  atorvastatin (LIPITOR) 40 MG tablet Take 40 mg by mouth at bedtime.    [provider]  Blood Pressure Monitoring (BLOOD PRESSURE CUFF) MISC 1 Product by Does not apply route as needed. 05/21/19    Terressa Koyanagi, DO  Calcium Carbonate-Vitamin D 600-400 MG-UNIT tablet Take 1 tablet by mouth 3 (three) times a week.     [provider]  carbidopa-levodopa (SINEMET IR) 25-100 MG tablet TAKE TWO TABLETS BY MOUTH EVERY MORNING and TAKE TWO TABLETS BY MOUTH AT NOON and TAKE TWO TABLETS BY MOUTH EVERYDAY AT BEDTIME 02/02/23   [name], [name], DO  Cholecalciferol (VITAMIN D PO) Take 5,000 Units by mouth 3 (three) times a week.     [provider]  Cyanocobalamin (VITAMIN B12 PO) Take 5,000 mcg by mouth 2 (two) times a week.    [provider]  glucose 4 GM chewable tablet Chew 1 tablet by mouth as needed for low blood sugar.    [provider]  glucose blood (ACCU-CHEK AVIVA PLUS) test strip Use to test blood sugar 4 times a day (Dx: E11.65) 12/25/19   [provider]  hydrochlorothiazide (HYDRODIURIL) 25 MG tablet Take 25 mg by mouth at bedtime. 06/29/12   [provider]  insulin glargine (LANTUS) 100 UNIT/ML injection Inject 0.05 mLs (5 Units total) into the skin every evening. Patient taking differently: Inject 5 Units into the skin at bedtime. 11/01/21   [name], MD  Insulin Syringe-Needle U-100 (INSULIN SYRINGE .3CC/31GX5/16") 31G X 5/16" 0.3 ML MISC Use as directed for Lantus injection once a day 12/25/19   [provider]  levothyroxine (SYNTHROID, LEVOTHROID) 75 MCG tablet Take 75 mcg by mouth daily before breakfast.    [provider]  losartan (COZAAR) 50 MG tablet TAKE ONE TABLET BY MOUTH EVERY EVENING 01/10/23   [name], MD  NONFORMULARY OR COMPOUNDED ITEM [facility]:  Antifungal - Terbinafine 3%, Fluconazole 2%, Tea Tree Oil 5%, Urea 10%, Ibuprofen 2% in DMSO Suspension #70ml. Apply to the affected nail(s) once (at bedtime) or twice daily. Patient taking differently: Apply 1 application  topically as needed (Toe nail fungus). [facility]:  Antifungal - Terbinafine 3%, Fluconazole 2%, Tea  Tree Oil 5%, Urea 10%, Ibuprofen 2% in DMSO Suspension #45ml. Apply to the affected nail(s) as needed for fungus 10/31/18   [name], [name], [name]  NOVOLOG FLEXPEN 100 UNIT/ML FlexPen Inject 3 Units into the skin 3 (three) times daily with meals. Sliding scale 101-120=1 unit 121-150=2 units 150-200=3 units 201-250= 4 units 251-300=5 units Over 300= units 11/09/22   [provider]  OneTouch Delica Lancets 33G MISC Use to test blood sugar 4 times a day (Dx: E11.65) 12/25/19   [provider]  Pioglitazone HCl (ACTOS PO) Take 45 mg by mouth at bedtime.    [provider]  Polyethyl Glycol-Propyl Glycol (SYSTANE) 0.4-0.3 % GEL ophthalmic gel Place 1 application into both eyes at bedtime as needed (FOR EYES).    [provider]     Family History  Problem Relation Age of Onset   Cancer Mother        liver   Liver cancer Mother    Stroke Father 27   Diabetes Father    Hypertension Father    Diabetes Maternal Grandmother  Prostate cancer Maternal Grandfather    Pancreatic cancer Brother    Diabetes Sister    Cancer Sister    Diabetes Sister    Colon polyps Neg Hx    Colon cancer Neg Hx    Esophageal cancer Neg Hx    Rectal cancer Neg Hx    Stomach cancer Neg Hx     Social History   Socioeconomic History   Marital status: Single    Spouse name: Not on file   Number of children: 1   Years of education: 20   Highest education level: Professional school degree (e.g., MD, DDS, DVM, JD)  Occupational History   Occupation: retired    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[employer], histology, anatomy and physiology  Tobacco Use   Smoking status: Never   Smokeless tobacco: Never  Vaping Use   Vaping Use: Never used  Substance and Sexual Activity   Alcohol use: No    Alcohol/week: 0.0 standard drinks of alcohol   Drug use: No   Sexual activity: Not Currently    Birth control/protection: Surgical    Comment: ABD Hysterectomy  Other Topics Concern   Not on file   Social History Narrative   Work: recently retired Tree surgeon, 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 Situation:  Lives alone, no family around, good friendsSpiritual Beliefs: christian, very involved with her church - [employer] churchLifestyle: exercising 5 days per week, trying to eat healthy   Caffeine 1/2 cup daily   Social Determinants of Health   Financial Resource Strain: Low Risk  (01/02/2023)   Overall Financial Resource Strain (CARDIA)    Difficulty of Paying Living Expenses: Not hard at all  Food Insecurity: No Food Insecurity (01/02/2023)   Hunger Vital Sign    Worried About Running Out of Food in the Last Year: Never true    Ran Out of Food in the Last Year: Never true  Transportation Needs: No Transportation Needs (01/02/2023)   PRAPARE - Administrator, Civil Service (Medical): No    Lack of Transportation (Non-Medical): No  Physical Activity: Inactive (01/02/2023)   Exercise Vital Sign    Days of Exercise per Week: 0 days    Minutes of Exercise per Session: 0 min  Stress: No Stress Concern Present (01/02/2023)   [employer] of Occupational Health - Occupational Stress Questionnaire    Feeling of Stress : Not at all  Social Connections: Moderately Integrated (12/29/2021)   Social Connection and Isolation Panel [NHANES]    Frequency of Communication with Friends and Family: More than three times a week    Frequency of Social Gatherings with Friends and Family: More than three times a week    Attends Religious Services: More than 4 times per year    Active Member of [employer] or Organizations: Yes    Attends Engineer, structural: More than 4 times per year    Marital Status: Never married    ECOG Status: 2 - Symptomatic, <50% confined to bed  Review of Systems  Review of Systems: A 12 point ROS discussed and pertinent positives are indicated in the HPI above.  All other systems are negative.   Physical Exam No  direct physical exam was performed (except for noted visual exam findings with Video Visits).   Vital Signs: There were no vitals taken for this visit.  Imaging:  CT abdomen and pelvis - 06/16/2026 Abdominal MRI - 06/21/2016; 03/17/2017; 06/07/2018; 06/05/2019; 07/20/2020; 04/17/2021; 06/24/2022; 02/10/2023 Intraprocedural  imaging during left-sided renal cryoablation performed 11/18/2022  Personal review of abdominal MRI performed 02/10/2023 demonstrates expected postprocedural change following left-sided renal cryoablation.  Note is made of a small perinephric hematoma extending inferior to the ablation site.  There is no definitive evidence of residual enhancing tumor to suggest residual or locally recurrent disease.  There is no definitive evidence of injury to the adjacent colon.  MR ABDOMEN WWO CONTRAST  Result Date: 02/14/2023 CLINICAL DATA:  Follow-up left renal lesion. History of RF ablation 11/18/2022 EXAM: MRI ABDOMEN WITHOUT AND WITH CONTRAST TECHNIQUE: Multiplanar multisequence MR imaging of the abdomen was performed both before and after the administration of intravenous contrast. CONTRAST:  6mL GADAVIST GADOBUTROL 1 MMOL/ML IV SOLN COMPARISON:  Multiple previous MR examinations. The most recent is 06/24/2022 FINDINGS: Lower chest: The lung bases are clear of an acute process. No pulmonary lesions or pleural or pericardial effusion. Hepatobiliary: No hepatic lesions or intrahepatic biliary dilatation. Gallbladder is. No common dilatation. Pancreas: Stable moderate pancreatic atrophy. Multiple small pancreatic cystic lesions are again demonstrated. 16 mm lesion in the uncinate process is unchanged. 7 mm cyst in the pancreatic head is stable. 4 mm cyst in the pancreatic body is unchanged and 8 mm cyst in the pancreatic body/tail junction region is stable. No worrisome contrast enhancement. Stability since 2019 consistent with a benign process. Spleen:  Normal size.  No focal lesions. Adrenals/Urinary  Tract: The adrenal glands are unremarkable and stable. Stable bilateral nonenhancing renal cysts not requiring any further imaging evaluation or follow-up. Stable 9 mm hemorrhagic interpolar left renal cyst (Bosniak 2). 3.2 x 3.2 cm lower pole left renal mass demonstrates heterogeneous areas of low and high T2 signal intensity. Mainly rim like internal increased T1 signal intensity likely post treatment hemorrhage. No worrisome contrast enhancement to suggest residual tumor. Perinephric interstitial changes not unexpected given the recent procedure. There is a small perinephric hematoma noted inferiorly measuring a maximum 2.1 cm. Diffusion positivity likely post treatment related. Stomach/Bowel: Stomach, duodenum, visualized small and visualized colon are grossly. Vascular/Lymphatic: The aorta and branch vessels are patent. The major venous structures are patent. No mesenteric or retroperitoneal mass or adenopathy. Other:  No ascites or abdominal hernia. Musculoskeletal: No significant bony findings. IMPRESSION: 1. 3.2 x 3.2 cm lower pole left renal mass with expected post ablation changes. No findings to suggest residual tumor. 2. Small perinephric hematoma inferiorly measuring a maximum 2.1 cm. 3. Stable 9 mm hemorrhagic interpolar left renal cyst (Bosniak 2). 4. Multiple small pancreatic cystic lesions are stable since 2019 consistent with a benign process. Electronically Signed   By: Rudie Meyer M.D.   On: 02/14/2023 08:43   CT Head Wo Contrast  Result Date: 01/18/2023 CLINICAL DATA:  Fall EXAM: CT HEAD WITHOUT CONTRAST CT MAXILLOFACIAL WITHOUT CONTRAST CT CERVICAL SPINE WITHOUT CONTRAST TECHNIQUE: Multidetector CT imaging of the head, cervical spine, and maxillofacial structures were performed using the standard protocol without intravenous contrast. Multiplanar CT image reconstructions of the cervical spine and maxillofacial structures were also generated. RADIATION DOSE REDUCTION: This exam was  performed according to the departmental dose-optimization program which includes automated exposure control, adjustment of the mA and/or kV according to patient size and/or use of iterative reconstruction technique. COMPARISON:  None Available. FINDINGS: CT HEAD FINDINGS Brain: No evidence of acute infarct, hemorrhage, mass, mass effect, or midline shift. No hydrocephalus or extra-axial fluid collection. Vascular: No hyperdense vessel. Skull: Negative for fracture or focal lesion. CT MAXILLOFACIAL FINDINGS Osseous: No fracture or mandibular dislocation. No  destructive process. Palatine torus. Periapical lucency about the roots the right maxillary first molar (series 14, image 59). Orbits: No traumatic or inflammatory finding. Status post bilateral lens replacements. Sinuses: Clear. Soft tissues: Evaluation of the perioral soft tissues is somewhat limited by beam hardening artifact the patient's dental hardware. Within this limitation, no large laceration or hematoma. Scattered skin calcifications. CT CERVICAL SPINE FINDINGS Alignment: No listhesis. Skull base and vertebrae: No acute fracture. No primary bone lesion or focal pathologic process. Soft tissues and spinal canal: No prevertebral fluid or swelling. No visible canal hematoma. Disc levels: Degenerative changes in the cervical spine. No high-grade spinal canal stenosis. Upper chest: Negative. Other: None. IMPRESSION: 1. No acute intracranial process. 2. No acute facial bone fracture. 3. No acute fracture or traumatic listhesis in the cervical spine. Electronically Signed   By: Wiliam Ke M.D.   On: 01/18/2023 16:23   CT Maxillofacial Wo Contrast  Result Date: 01/18/2023 CLINICAL DATA:  Fall EXAM: CT HEAD WITHOUT CONTRAST CT MAXILLOFACIAL WITHOUT CONTRAST CT CERVICAL SPINE WITHOUT CONTRAST TECHNIQUE: Multidetector CT imaging of the head, cervical spine, and maxillofacial structures were performed using the standard protocol without intravenous contrast.  Multiplanar CT image reconstructions of the cervical spine and maxillofacial structures were also generated. RADIATION DOSE REDUCTION: This exam was performed according to the departmental dose-optimization program which includes automated exposure control, adjustment of the mA and/or kV according to patient size and/or use of iterative reconstruction technique. COMPARISON:  None Available. FINDINGS: CT HEAD FINDINGS Brain: No evidence of acute infarct, hemorrhage, mass, mass effect, or midline shift. No hydrocephalus or extra-axial fluid collection. Vascular: No hyperdense vessel. Skull: Negative for fracture or focal lesion. CT MAXILLOFACIAL FINDINGS Osseous: No fracture or mandibular dislocation. No destructive process. Palatine torus. Periapical lucency about the roots the right maxillary first molar (series 14, image 59). Orbits: No traumatic or inflammatory finding. Status post bilateral lens replacements. Sinuses: Clear. Soft tissues: Evaluation of the perioral soft tissues is somewhat limited by beam hardening artifact the patient's dental hardware. Within this limitation, no large laceration or hematoma. Scattered skin calcifications. CT CERVICAL SPINE FINDINGS Alignment: No listhesis. Skull base and vertebrae: No acute fracture. No primary bone lesion or focal pathologic process. Soft tissues and spinal canal: No prevertebral fluid or swelling. No visible canal hematoma. Disc levels: Degenerative changes in the cervical spine. No high-grade spinal canal stenosis. Upper chest: Negative. Other: None. IMPRESSION: 1. No acute intracranial process. 2. No acute facial bone fracture. 3. No acute fracture or traumatic listhesis in the cervical spine. Electronically Signed   By: Wiliam Ke M.D.   On: 01/18/2023 16:23   CT Cervical Spine Wo Contrast  Result Date: 01/18/2023 CLINICAL DATA:  Fall EXAM: CT HEAD WITHOUT CONTRAST CT MAXILLOFACIAL WITHOUT CONTRAST CT CERVICAL SPINE WITHOUT CONTRAST TECHNIQUE:  Multidetector CT imaging of the head, cervical spine, and maxillofacial structures were performed using the standard protocol without intravenous contrast. Multiplanar CT image reconstructions of the cervical spine and maxillofacial structures were also generated. RADIATION DOSE REDUCTION: This exam was performed according to the departmental dose-optimization program which includes automated exposure control, adjustment of the mA and/or kV according to patient size and/or use of iterative reconstruction technique. COMPARISON:  None Available. FINDINGS: CT HEAD FINDINGS Brain: No evidence of acute infarct, hemorrhage, mass, mass effect, or midline shift. No hydrocephalus or extra-axial fluid collection. Vascular: No hyperdense vessel. Skull: Negative for fracture or focal lesion. CT MAXILLOFACIAL FINDINGS Osseous: No fracture or mandibular dislocation. No destructive process. Palatine  torus. Periapical lucency about the roots the right maxillary first molar (series 14, image 59). Orbits: No traumatic or inflammatory finding. Status post bilateral lens replacements. Sinuses: Clear. Soft tissues: Evaluation of the perioral soft tissues is somewhat limited by beam hardening artifact the patient's dental hardware. Within this limitation, no large laceration or hematoma. Scattered skin calcifications. CT CERVICAL SPINE FINDINGS Alignment: No listhesis. Skull base and vertebrae: No acute fracture. No primary bone lesion or focal pathologic process. Soft tissues and spinal canal: No prevertebral fluid or swelling. No visible canal hematoma. Disc levels: Degenerative changes in the cervical spine. No high-grade spinal canal stenosis. Upper chest: Negative. Other: None. IMPRESSION: 1. No acute intracranial process. 2. No acute facial bone fracture. 3. No acute fracture or traumatic listhesis in the cervical spine. Electronically Signed   By: Wiliam Ke M.D.   On: 01/18/2023 16:23    Labs:  CBC: Recent Labs     05/26/22 0000 11/18/22 0750  WBC  --  4.7  HGB 9.5* 9.2*  HCT  --  30.1*  PLT  --  202    COAGS: Recent Labs    11/18/22 0750  INR 1.3*    BMP: Recent Labs    11/18/22 0750  NA 141  K 4.3  CL 106  CO2 24  GLUCOSE 147*  BUN 22  CALCIUM 9.2  CREATININE 1.24*  GFRNONAA 45*    LIVER FUNCTION TESTS: No results for input(s): "BILITOT", "AST", "ALT", "ALKPHOS", "PROT", "ALBUMIN" in the last 8760 hours.  TUMOR MARKERS: No results for input(s): "AFPTM", "CEA", "CA199", "CHROMGRNA" in the last 8760 hours.  Assessment and Plan:  Pamela Patton is a 77 y.o. female with complex past medical history significant for stage III chronic kidney disease, hypertension, hyperlipidemia, hypothyroidism and Parkinson disease who Is seen today in telemedicine consultation following left-sided renal cryoablation performed 11/18/2022.   The following examinations were personally reviewed in detail:  CT abdomen and pelvis - 06/16/2026 Abdominal MRI - 06/21/2016; 03/17/2017; 06/07/2018; 06/05/2019; 07/20/2020; 04/17/2021; 06/24/2022; 02/10/2023 Intraprocedural imaging during left-sided renal cryoablation performed 11/18/2022  Personal review of abdominal MRI performed 02/10/2023 demonstrates expected postprocedural change following left-sided renal cryoablation.  Note is made of a small perinephric hematoma extending inferior to the ablation site.  There is no definitive evidence of residual enhancing tumor to suggest residual or locally recurrent disease.  There is no definitive evidence of injury to the adjacent colon. ______________________________________________________________  Once again, I explained that I am hopeful the procedure resulted in a cure for her renal cell carcinoma however this will be confirmed with serial surveillance examinations as surgical margins were not obtained at the time of the cryoablation.   The initial postprocedural abdominal MRI demonstrates a technically excellent result  without evidence of residual or locally recurrent disease without definitive evidence of injury to the adjacent descending colon.  The patient states that MRIs are challenging for her given the length of time she has to be on the gantry for the image acquisition. As such, subsequent examinations surveillance examination surveillance will be performed with renal protocol CT scans, and abdominal MRI will be reserved for problem-solving purposes.  As such, the next surveillance renal protocol CT scan will be performed in 3 months (mid August, 2024).  The patient demonstrated excellent understanding of the above conversation and is agreement with the proposed plan of care.   Plan: - Follow-up telemedicine consultation following acquisition of next surveillance renal protocol CT scan to be performed in 3 months (mid  August 2024).  A copy of this report was sent to the requesting provider on this date.  Electronically Signed: Simonne Come 02/16/2023, 10:04 AM   I spent a total of 15 Minutes in remote  clinical consultation, greater than 50% of which was counseling/coordinating care for post left sided renal cryoablation.    Visit type: Audio only (telephone). Audio (no video) only due to patient's lack of internet/smartphone capability. Alternative for in-person consultation at [facility], [address] [place], [place], [place]. This visit type was conducted due to national recommendations for restrictions regarding the COVID-19 Pandemic (e.g. social distancing).  This format is felt to be most appropriate for this patient at this time.  All issues noted in this document were discussed and addressed.

## 2023-03-23 LAB — HM DIABETES EYE EXAM

## 2023-04-27 LAB — HEMOGLOBIN A1C: Hemoglobin A1C: 7.8

## 2023-05-03 NOTE — Progress Notes (Unsigned)
Assessment/Plan:   1.  Parkinsons Disease  -Continue carbidopa/levodopa 25/100, 2 tablets 3 times per day.  -We discussed that it used to be thought that levodopa would increase risk of melanoma but now it is believed that Parkinsons itself likely increases risk of melanoma. she is to get regular skin checks.  -MOCA 10/2022.  She did very well with orientation and trail making.  She scored 24/30.  I think she would be okay to drive but since it has been so long that she has driven, I would recommend she have a driving evaluation.   2.  RBD             -Doing fairly well in that regard.  Does not want any medication. 3.  MCI             -Had neurocognitive testing with Dr. Milbert Coulter in June, 2021 with evidence of MCI.   4.  Uncontrolled diabetes  -Following with endocrinology.  Hemoglobin A1c is improving, but still not completely ideal.  Last A1c was 7.8 in July, 2024.  Subjective:   Pamela Patton was seen today in follow up for Parkinsons disease.  My previous records were reviewed prior to todays visit as well as outside records available to me.  Patient was in the emergency room on April 17 after a fall.  She tripped and hit her face on concrete.  CT brain was unremarkable.  She has not had further falls.  She continues to follow with endocrinology, last being seen last week.  Notes are reviewed.  Current prescribed movement disorder medications: Carbidopa/levodopa 25/100, 2 tablets 3 times per day    PREVIOUS MEDICATIONS: mirtazapine (given but not taken)  ALLERGIES:  No Known Allergies  CURRENT MEDICATIONS:  Outpatient Encounter Medications as of 05/04/2023  Medication Sig   aspirin 81 MG tablet Take 1 tablet (81 mg total) by mouth at bedtime. Ok to restart 11/20/22   atorvastatin (LIPITOR) 40 MG tablet Take 40 mg by mouth at bedtime.   Blood Pressure Monitoring (BLOOD PRESSURE CUFF) MISC 1 Product by Does not apply route as needed.   Calcium Carbonate-Vitamin D 600-400 MG-UNIT  tablet Take 1 tablet by mouth 3 (three) times a week.    carbidopa-levodopa (SINEMET IR) 25-100 MG tablet TAKE TWO TABLETS BY MOUTH EVERY MORNING and TAKE TWO TABLETS BY MOUTH AT NOON and TAKE TWO TABLETS BY MOUTH EVERYDAY AT BEDTIME   Cholecalciferol (VITAMIN D PO) Take 5,000 Units by mouth 3 (three) times a week.    Cyanocobalamin (VITAMIN B12 PO) Take 5,000 mcg by mouth 2 (two) times a week.   glucose 4 GM chewable tablet Chew 1 tablet by mouth as needed for low blood sugar.   glucose blood (ACCU-CHEK AVIVA PLUS) test strip Use to test blood sugar 4 times a day (Dx: E11.65)   hydrochlorothiazide (HYDRODIURIL) 25 MG tablet Take 25 mg by mouth at bedtime.   insulin glargine (LANTUS) 100 UNIT/ML injection Inject 0.05 mLs (5 Units total) into the skin every evening. (Patient taking differently: Inject 5 Units into the skin at bedtime.)   Insulin Syringe-Needle U-100 (INSULIN SYRINGE .3CC/31GX5/16") 31G X 5/16" 0.3 ML MISC Use as directed for Lantus injection once a day   levothyroxine (SYNTHROID, LEVOTHROID) 75 MCG tablet Take 75 mcg by mouth daily before breakfast.   losartan (COZAAR) 50 MG tablet TAKE ONE TABLET BY MOUTH EVERY EVENING   NONFORMULARY OR COMPOUNDED ITEM [facility]:  Antifungal - Terbinafine 3%, Fluconazole 2%,  Tea Tree Oil 5%, Urea 10%, Ibuprofen 2% in DMSO Suspension #16ml. Apply to the affected nail(s) once (at bedtime) or twice daily. (Patient taking differently: Apply 1 application  topically as needed (Toe nail fungus). [facility]:  Antifungal - Terbinafine 3%, Fluconazole 2%, Tea Tree Oil 5%, Urea 10%, Ibuprofen 2% in DMSO Suspension #34ml. Apply to the affected nail(s) as needed for fungus)   NOVOLOG FLEXPEN 100 UNIT/ML FlexPen Inject 3 Units into the skin 3 (three) times daily with meals. Sliding scale 101-120=1 unit 121-150=2 units 150-200=3 units 201-250= 4 units 251-300=5 units Over 300= units   OneTouch Delica Lancets 33G MISC Use to test blood sugar 4  times a day (Dx: E11.65)   Pioglitazone HCl (ACTOS PO) Take 45 mg by mouth at bedtime.   Polyethyl Glycol-Propyl Glycol (SYSTANE) 0.4-0.3 % GEL ophthalmic gel Place 1 application into both eyes at bedtime as needed (FOR EYES).   No facility-administered encounter medications on file as of 05/04/2023.    Objective:   PHYSICAL EXAMINATION:    VITALS:   There were no vitals filed for this visit.   GEN:  The patient appears stated age and is in NAD. HEENT:  Normocephalic, atraumatic.  The mucous membranes are moist.   Neurological examination:  Orientation:     10/24/2022    8:00 AM  Montreal Cognitive Assessment   Visuospatial/ Executive (0/5) 5  Naming (0/3) 3  Attention: Read list of digits (0/2) 1  Attention: Read list of letters (0/1) 1  Attention: Serial 7 subtraction starting at 100 (0/3) 1  Language: Repeat phrase (0/2) 2  Language : Fluency (0/1) 0  Abstraction (0/2) 2  Delayed Recall (0/5) 3  Orientation (0/6) 6  Total 24  Adjusted Score (based on education) 24    Cranial nerves: There is good facial symmetry with mild facial hypomimia. There is upgaze paresis.  The speech is fluent and clear but pseudobulbar, same as previous. Soft palate rises symmetrically and there is no tongue deviation. Hearing is intact to conversational tone. Sensation: Sensation is intact to light touch throughout Motor: Strength is at least antigravity x4.  She hasn't taken medication medication yet today.  Movement examination: Tone: There is normal tone today Abnormal movements: none Coordination:  There is decremation, mild, bilateral, RUE>LUE and the UE>LE Gait and Station: Patient pushes off of the chair to arise.  The patient's stride length is slightly decreased with slight decrease arm swing on the L and she is flexed at the waist and the knees  I have reviewed and interpreted the following labs independently  Her B12 was 450 on April 27, 2023.  Her hemoglobin A1c was 7.8 that  same day.  Total time spent on today's visit was *** minutes, including both face-to-face time and nonface-to-face time.  Time included that spent on review of records (prior notes available to me/labs/imaging if pertinent), discussing treatment and goals, answering patient's questions and coordinating care.  Cc:  [name], MD

## 2023-05-04 ENCOUNTER — Ambulatory Visit: Payer: Medicare PPO | Admitting: Neurology

## 2023-05-04 ENCOUNTER — Other Ambulatory Visit: Payer: Self-pay

## 2023-05-04 ENCOUNTER — Encounter: Payer: Self-pay | Admitting: Neurology

## 2023-05-04 VITALS — BP 120/66 | HR 70 | Ht 66.0 in | Wt 126.2 lb

## 2023-05-04 DIAGNOSIS — E1129 Type 2 diabetes mellitus with other diabetic kidney complication: Secondary | ICD-10-CM | POA: Diagnosis not present

## 2023-05-04 DIAGNOSIS — G3184 Mild cognitive impairment, so stated: Secondary | ICD-10-CM

## 2023-05-04 DIAGNOSIS — Z794 Long term (current) use of insulin: Secondary | ICD-10-CM

## 2023-05-04 DIAGNOSIS — G20A1 Parkinson's disease without dyskinesia, without mention of fluctuations: Secondary | ICD-10-CM | POA: Diagnosis not present

## 2023-05-04 DIAGNOSIS — R2689 Other abnormalities of gait and mobility: Secondary | ICD-10-CM

## 2023-05-04 NOTE — Patient Instructions (Addendum)
We discussed a nonprofit medical company called [employer].  They have all kinds of medical equipment including wheelchairs, walkers, canes and incontinence materials, which is free of charge.  Their phone number is [phone].  Their email is [email or web address].  Feel free to reach out to them with questions.  I believe that they are only open for pick up on Tuesdays but you can contact them other days of the week.  Use a walker at all times!  SAVE THE DATE!  We are planning a Parkinsons Disease educational symposium at [facility] in [place] on October 11.  More details to come!  If you would like to be added to our email list to get further information, email [email or web address].  We hope to see you there!

## 2023-05-05 NOTE — Chronic Care Management (AMB) (Signed)
Chronic Care Management   CCM RN Visit Note   Name: Pamela Patton MRN: [id number] DOB: 27-Feb-1946  Subjective: Pamela Patton is a 77 y.o. year old female who is a primary care patient of [name], MD. The patient was referred to the Chronic Care Management team for assistance with care management needs subsequent to provider initiation of CCM services and plan of care.    Today's Visit:  Engaged with patient by telephone for Transition of Care.   Today's TOC FU Call Status: Today's TOC FU Call Status:: Successful TOC FU Call Competed TOC FU Call Complete Date: 01/19/23  Transition Care Management Follow-up Telephone Call Date of Discharge: 01/18/23 Discharge Facility: [facility] ([facility]) Type of Discharge: Emergency Department Reason for ED Visit: Other: (Fall) How have you been since you were released from the hospital?: Better Any questions or concerns?: No  Items Reviewed: Did you receive and understand the discharge instructions provided?: Yes Medications obtained,verified, and reconciled?: Yes (Medications Reviewed) Any new allergies since your discharge?: No Dietary orders reviewed?: NA Do you have support at home?: No (Reports friends available to assist if needed)  Medications Reviewed Today: Medications Reviewed Today     Reviewed by Juanell Fairly, RN (Registered Nurse) on 01/19/23 at 1115  Med List Status: <None>   Medication Order Taking? Sig Documenting Provider Last Dose Status Informant  aspirin 81 MG tablet [id number] Yes Take 1 tablet (81 mg total) by mouth at bedtime. Ok to restart 11/20/22 [name], NP Taking Active   atorvastatin (LIPITOR) 40 MG tablet [id number] Yes Take 40 mg by mouth at bedtime. [provider] Taking Active Self  Blood Pressure Monitoring (BLOOD PRESSURE CUFF) MISC [id number]  1 Product by Does not apply route as needed. Terressa Koyanagi, DO  Active Self  Calcium  Carbonate-Vitamin D 600-400 MG-UNIT tablet [id number] Yes Take 1 tablet by mouth 3 (three) times a week.  [provider] Taking Active Self  carbidopa-levodopa (SINEMET IR) 25-100 MG tablet [id number]  TAKE TWO TABLETS BY MOUTH EVERY MORNING and TAKE TWO TABLETS BY MOUTH AT NOON and TAKE TWO TABLETS BY MOUTH EVERYDAY AT BEDTIME [name], [name], DO  Active Self  Cholecalciferol (VITAMIN D PO) [id number] Yes Take 5,000 Units by mouth 3 (three) times a week.  [provider] Taking Active Self  Cyanocobalamin (VITAMIN B12 PO) [id number] Yes Take 5,000 mcg by mouth 2 (two) times a week. [provider] Taking Active Self  glucose 4 GM chewable tablet [id number] Yes Chew 1 tablet by mouth as needed for low blood sugar. [provider] Taking Active Self           Med Note Lenoria Farrier   Tue Nov 15, 2022  4:30 PM)    glucose blood (ACCU-CHEK AVIVA PLUS) test strip [id number]  Use to test blood sugar 4 times a day (Dx: E11.65) [provider]  Active Self  hydrochlorothiazide (HYDRODIURIL) 25 MG tablet [id number] Yes Take 25 mg by mouth at bedtime. [provider] Taking Active Self  insulin glargine (LANTUS) 100 UNIT/ML injection [id number] Yes Inject 0.05 mLs (5 Units total) into the skin every evening.  Patient taking differently: Inject 5 Units into the skin at bedtime.   [name], MD Taking Active Self  Insulin Syringe-Needle U-100 (INSULIN SYRINGE .3CC/31GX5/16") 31G X  5/16" 0.3 ML MISC [id number]  Use as directed for Lantus injection once a day [provider]  Active Self  levothyroxine (SYNTHROID, LEVOTHROID) 75 MCG tablet [id number]  Take 75 mcg by mouth daily before breakfast. [provider]  Active Self           Med Note Tamela Gammon Nov 15, 2022  4:35 PM)    losartan (COZAAR) 50 MG tablet [id number]  TAKE ONE TABLET BY MOUTH EVERY Jerilynn Birkenhead, MD  Active   NONFORMULARY OR COMPOUNDED ITEM  [id number]  [facility]:  Antifungal - Terbinafine 3%, Fluconazole 2%, Tea Tree Oil 5%, Urea 10%, Ibuprofen 2% in DMSO Suspension #29ml. Apply to the affected nail(s) once (at bedtime) or twice daily.  Patient taking differently: Apply 1 application  topically as needed (Toe nail fungus). [facility]:  Antifungal - Terbinafine 3%, Fluconazole 2%, Tea Tree Oil 5%, Urea 10%, Ibuprofen 2% in DMSO Suspension #51ml. Apply to the affected nail(s) as needed for fungus   Freddie Breech, [name]  Active Self  NOVOLOG FLEXPEN 100 UNIT/ML FlexPen [id number] Yes Inject 3 Units into the skin 3 (three) times daily with meals. Sliding scale 101-120=1 unit 121-150=2 units 150-200=3 units 201-250= 4 units 251-300=5 units Over 300= units [provider] Taking Active Self  OneTouch Delica Lancets 33G MISC [id number]  Use to test blood sugar 4 times a day (Dx: E11.65) [provider]  Active Self  Pioglitazone HCl (ACTOS PO) [id number]  Take 45 mg by mouth at bedtime. [provider]  Active Self  Polyethyl Glycol-Propyl Glycol (SYSTANE) 0.4-0.3 % GEL ophthalmic gel [id number]  Place 1 application into both eyes at bedtime as needed (FOR EYES). [provider]  Active Self            Home Care and Equipment/Supplies: Were Home Health Services Ordered?: No Any new equipment or medical supplies ordered?: No  Functional Questionnaire: Do you need assistance with bathing/showering or dressing?: No Do you need assistance with meal preparation?: No Do you need assistance with eating?: No Do you have difficulty maintaining continence: No Do you need assistance with getting out of bed/getting out of a chair/moving?: No Do you have difficulty managing or taking your medications?: No  Follow up appointments reviewed: PCP Follow-up appointment confirmed?: Yes Date of PCP follow-up appointment?: 01/23/23 Follow-up Provider: Dr Anibal Henderson; Specialist [facility]  Follow-up appointment confirmed?: NA Do you need transportation to your follow-up appointment?: No Do you understand care options if your condition(s) worsen?: Yes-patient verbalized understanding   [facility]/Chronic Care Management [phone]

## 2023-05-08 ENCOUNTER — Encounter: Payer: Self-pay | Admitting: Neurology

## 2023-05-08 ENCOUNTER — Other Ambulatory Visit: Payer: Self-pay | Admitting: Neurology

## 2023-05-08 DIAGNOSIS — G20A1 Parkinson's disease without dyskinesia, without mention of fluctuations: Secondary | ICD-10-CM

## 2023-05-10 DIAGNOSIS — E1169 Type 2 diabetes mellitus with other specified complication: Secondary | ICD-10-CM | POA: Diagnosis not present

## 2023-05-10 DIAGNOSIS — N183 Chronic kidney disease, stage 3 unspecified: Secondary | ICD-10-CM | POA: Diagnosis not present

## 2023-05-10 DIAGNOSIS — G20A1 Parkinson's disease without dyskinesia, without mention of fluctuations: Secondary | ICD-10-CM | POA: Diagnosis not present

## 2023-05-10 DIAGNOSIS — E039 Hypothyroidism, unspecified: Secondary | ICD-10-CM | POA: Diagnosis not present

## 2023-05-10 DIAGNOSIS — I129 Hypertensive chronic kidney disease with stage 1 through stage 4 chronic kidney disease, or unspecified chronic kidney disease: Secondary | ICD-10-CM | POA: Diagnosis not present

## 2023-05-10 DIAGNOSIS — Z794 Long term (current) use of insulin: Secondary | ICD-10-CM | POA: Diagnosis not present

## 2023-05-10 DIAGNOSIS — E785 Hyperlipidemia, unspecified: Secondary | ICD-10-CM | POA: Diagnosis not present

## 2023-05-10 DIAGNOSIS — E1122 Type 2 diabetes mellitus with diabetic chronic kidney disease: Secondary | ICD-10-CM | POA: Diagnosis not present

## 2023-05-10 DIAGNOSIS — E559 Vitamin D deficiency, unspecified: Secondary | ICD-10-CM | POA: Diagnosis not present

## 2023-05-15 ENCOUNTER — Telehealth: Payer: Self-pay | Admitting: Neurology

## 2023-05-15 NOTE — Telephone Encounter (Signed)
Pamela Patton with home health was called with verbal order Home Health PT for 1 time a week for 9 weeks and Occupational Therapy Evaluation

## 2023-05-15 NOTE — Telephone Encounter (Signed)
Caller stated she is requesting authorization for Home Health PT for 1 time a week for 9 weeks and Occupational Therapy Evaluation

## 2023-05-17 ENCOUNTER — Other Ambulatory Visit: Payer: Self-pay | Admitting: Interventional Radiology

## 2023-05-17 DIAGNOSIS — G20A1 Parkinson's disease without dyskinesia, without mention of fluctuations: Secondary | ICD-10-CM | POA: Diagnosis not present

## 2023-05-17 DIAGNOSIS — E1169 Type 2 diabetes mellitus with other specified complication: Secondary | ICD-10-CM | POA: Diagnosis not present

## 2023-05-17 DIAGNOSIS — N183 Chronic kidney disease, stage 3 unspecified: Secondary | ICD-10-CM | POA: Diagnosis not present

## 2023-05-17 DIAGNOSIS — Z794 Long term (current) use of insulin: Secondary | ICD-10-CM | POA: Diagnosis not present

## 2023-05-17 DIAGNOSIS — E1122 Type 2 diabetes mellitus with diabetic chronic kidney disease: Secondary | ICD-10-CM | POA: Diagnosis not present

## 2023-05-17 DIAGNOSIS — E559 Vitamin D deficiency, unspecified: Secondary | ICD-10-CM | POA: Diagnosis not present

## 2023-05-17 DIAGNOSIS — N2889 Other specified disorders of kidney and ureter: Secondary | ICD-10-CM

## 2023-05-17 DIAGNOSIS — E785 Hyperlipidemia, unspecified: Secondary | ICD-10-CM | POA: Diagnosis not present

## 2023-05-17 DIAGNOSIS — E039 Hypothyroidism, unspecified: Secondary | ICD-10-CM | POA: Diagnosis not present

## 2023-05-17 DIAGNOSIS — I129 Hypertensive chronic kidney disease with stage 1 through stage 4 chronic kidney disease, or unspecified chronic kidney disease: Secondary | ICD-10-CM | POA: Diagnosis not present

## 2023-05-22 ENCOUNTER — Telehealth: Payer: Self-pay | Admitting: Neurology

## 2023-05-22 NOTE — Telephone Encounter (Signed)
Called and lost connection

## 2023-05-22 NOTE — Telephone Encounter (Signed)
Called patients son back and talked with him   about FMLA

## 2023-05-22 NOTE — Telephone Encounter (Signed)
Caller stated message is for [facility]. Stated the call is regarding FMLA

## 2023-05-22 NOTE — Telephone Encounter (Signed)
Left message with the after hour service on  05-22-23@12 :42 pm   Caller states her name is Vernona Rieger and is calling from [employer] and needs verbal orders

## 2023-05-24 DIAGNOSIS — E1169 Type 2 diabetes mellitus with other specified complication: Secondary | ICD-10-CM | POA: Diagnosis not present

## 2023-05-24 DIAGNOSIS — E1122 Type 2 diabetes mellitus with diabetic chronic kidney disease: Secondary | ICD-10-CM | POA: Diagnosis not present

## 2023-05-24 DIAGNOSIS — E785 Hyperlipidemia, unspecified: Secondary | ICD-10-CM | POA: Diagnosis not present

## 2023-05-24 DIAGNOSIS — E559 Vitamin D deficiency, unspecified: Secondary | ICD-10-CM | POA: Diagnosis not present

## 2023-05-24 DIAGNOSIS — E039 Hypothyroidism, unspecified: Secondary | ICD-10-CM | POA: Diagnosis not present

## 2023-05-24 DIAGNOSIS — Z794 Long term (current) use of insulin: Secondary | ICD-10-CM | POA: Diagnosis not present

## 2023-05-24 DIAGNOSIS — N183 Chronic kidney disease, stage 3 unspecified: Secondary | ICD-10-CM | POA: Diagnosis not present

## 2023-05-24 DIAGNOSIS — G20A1 Parkinson's disease without dyskinesia, without mention of fluctuations: Secondary | ICD-10-CM | POA: Diagnosis not present

## 2023-05-24 DIAGNOSIS — I129 Hypertensive chronic kidney disease with stage 1 through stage 4 chronic kidney disease, or unspecified chronic kidney disease: Secondary | ICD-10-CM | POA: Diagnosis not present

## 2023-05-25 ENCOUNTER — Telehealth: Payer: Self-pay | Admitting: Neurology

## 2023-05-25 DIAGNOSIS — Z794 Long term (current) use of insulin: Secondary | ICD-10-CM | POA: Diagnosis not present

## 2023-05-25 DIAGNOSIS — E1169 Type 2 diabetes mellitus with other specified complication: Secondary | ICD-10-CM | POA: Diagnosis not present

## 2023-05-25 DIAGNOSIS — G20A1 Parkinson's disease without dyskinesia, without mention of fluctuations: Secondary | ICD-10-CM | POA: Diagnosis not present

## 2023-05-25 DIAGNOSIS — E559 Vitamin D deficiency, unspecified: Secondary | ICD-10-CM | POA: Diagnosis not present

## 2023-05-25 DIAGNOSIS — E039 Hypothyroidism, unspecified: Secondary | ICD-10-CM | POA: Diagnosis not present

## 2023-05-25 DIAGNOSIS — N183 Chronic kidney disease, stage 3 unspecified: Secondary | ICD-10-CM | POA: Diagnosis not present

## 2023-05-25 DIAGNOSIS — E1122 Type 2 diabetes mellitus with diabetic chronic kidney disease: Secondary | ICD-10-CM | POA: Diagnosis not present

## 2023-05-25 DIAGNOSIS — Z0279 Encounter for issue of other medical certificate: Secondary | ICD-10-CM

## 2023-05-25 DIAGNOSIS — E785 Hyperlipidemia, unspecified: Secondary | ICD-10-CM | POA: Diagnosis not present

## 2023-05-25 DIAGNOSIS — I129 Hypertensive chronic kidney disease with stage 1 through stage 4 chronic kidney disease, or unspecified chronic kidney disease: Secondary | ICD-10-CM | POA: Diagnosis not present

## 2023-05-25 NOTE — Telephone Encounter (Signed)
Pt came by and dropped off FMLA forms to be completed by the provider.  Upon the completion of the form the would like for it to be faxed to [phone] ([email or web address]) and pt would also like for a copy to me mailed to pts address ([address] [place], [place] [address] to the attn: Christena Deem).  Gave to Rosston C to complete Admin process.

## 2023-05-26 ENCOUNTER — Telehealth: Payer: Self-pay | Admitting: Neurology

## 2023-05-26 NOTE — Telephone Encounter (Signed)
Judeth Cornfield from [facility] health, called to get continuation orders for skilled nursing for Pt [phone]

## 2023-05-26 NOTE — Telephone Encounter (Signed)
FMLA forms completed

## 2023-05-26 NOTE — Telephone Encounter (Signed)
Faxed

## 2023-05-26 NOTE — Telephone Encounter (Signed)
Started this

## 2023-05-31 ENCOUNTER — Telehealth: Payer: Self-pay | Admitting: Family Medicine

## 2023-05-31 DIAGNOSIS — N183 Chronic kidney disease, stage 3 unspecified: Secondary | ICD-10-CM | POA: Diagnosis not present

## 2023-05-31 DIAGNOSIS — G20A1 Parkinson's disease without dyskinesia, without mention of fluctuations: Secondary | ICD-10-CM | POA: Diagnosis not present

## 2023-05-31 DIAGNOSIS — I129 Hypertensive chronic kidney disease with stage 1 through stage 4 chronic kidney disease, or unspecified chronic kidney disease: Secondary | ICD-10-CM | POA: Diagnosis not present

## 2023-05-31 DIAGNOSIS — E1169 Type 2 diabetes mellitus with other specified complication: Secondary | ICD-10-CM | POA: Diagnosis not present

## 2023-05-31 DIAGNOSIS — E559 Vitamin D deficiency, unspecified: Secondary | ICD-10-CM | POA: Diagnosis not present

## 2023-05-31 DIAGNOSIS — E1122 Type 2 diabetes mellitus with diabetic chronic kidney disease: Secondary | ICD-10-CM | POA: Diagnosis not present

## 2023-05-31 DIAGNOSIS — E039 Hypothyroidism, unspecified: Secondary | ICD-10-CM | POA: Diagnosis not present

## 2023-05-31 DIAGNOSIS — Z794 Long term (current) use of insulin: Secondary | ICD-10-CM | POA: Diagnosis not present

## 2023-05-31 DIAGNOSIS — E785 Hyperlipidemia, unspecified: Secondary | ICD-10-CM | POA: Diagnosis not present

## 2023-05-31 NOTE — Telephone Encounter (Signed)
Pamela Patton from [facility] call and stated pt had a low BP last night 82/42 and pt stated she did take her medication .After putting her feet up it went up to 90/46 also pt stated she haven't been feeling good in the morning so if pt need any chang est contact pt .Allison's # is [phone].

## 2023-05-31 NOTE — Telephone Encounter (Signed)
Spoke with [name] and informed her of the message below.  Spoke with the patient and scheduled an appt with Dr Caryl Never on 9/4 as PCP does not have any openings soon.  Follow up appt was also scheduled with PCP on 10/10.

## 2023-05-31 NOTE — Telephone Encounter (Signed)
Noted, she was supposed to see me in August for a 6 month follow up. Please have patient schedule an appt and we can review her medications.

## 2023-06-01 ENCOUNTER — Telehealth: Payer: Self-pay | Admitting: Family Medicine

## 2023-06-01 DIAGNOSIS — E785 Hyperlipidemia, unspecified: Secondary | ICD-10-CM | POA: Diagnosis not present

## 2023-06-01 DIAGNOSIS — E039 Hypothyroidism, unspecified: Secondary | ICD-10-CM | POA: Diagnosis not present

## 2023-06-01 DIAGNOSIS — N183 Chronic kidney disease, stage 3 unspecified: Secondary | ICD-10-CM | POA: Diagnosis not present

## 2023-06-01 DIAGNOSIS — I129 Hypertensive chronic kidney disease with stage 1 through stage 4 chronic kidney disease, or unspecified chronic kidney disease: Secondary | ICD-10-CM | POA: Diagnosis not present

## 2023-06-01 DIAGNOSIS — G20A1 Parkinson's disease without dyskinesia, without mention of fluctuations: Secondary | ICD-10-CM | POA: Diagnosis not present

## 2023-06-01 DIAGNOSIS — Z794 Long term (current) use of insulin: Secondary | ICD-10-CM | POA: Diagnosis not present

## 2023-06-01 DIAGNOSIS — E559 Vitamin D deficiency, unspecified: Secondary | ICD-10-CM | POA: Diagnosis not present

## 2023-06-01 DIAGNOSIS — E1169 Type 2 diabetes mellitus with other specified complication: Secondary | ICD-10-CM | POA: Diagnosis not present

## 2023-06-01 DIAGNOSIS — E1122 Type 2 diabetes mellitus with diabetic chronic kidney disease: Secondary | ICD-10-CM | POA: Diagnosis not present

## 2023-06-01 NOTE — Telephone Encounter (Signed)
Stephanie from [employer] call and stated pt had a low  BP for the last wk and on to medication also want to know if it need adjusting. Stephanie's # is [phone].

## 2023-06-01 NOTE — Telephone Encounter (Signed)
Left a detailed message on Pamela Patton's voicemail that the patient will be contacted for an earlier appt as below.  Spoke with the patient, scheduled a visit with Kandee Keen on 8/30 and cancelled the appt on 9/4 with Dr Caryl Never.

## 2023-06-02 ENCOUNTER — Ambulatory Visit: Payer: Medicare PPO | Admitting: Adult Health

## 2023-06-02 VITALS — BP 100/58 | HR 62 | Temp 98.5°F | Ht 66.0 in | Wt 127.0 lb

## 2023-06-02 DIAGNOSIS — N183 Chronic kidney disease, stage 3 unspecified: Secondary | ICD-10-CM | POA: Diagnosis not present

## 2023-06-02 DIAGNOSIS — E1169 Type 2 diabetes mellitus with other specified complication: Secondary | ICD-10-CM | POA: Diagnosis not present

## 2023-06-02 DIAGNOSIS — I959 Hypotension, unspecified: Secondary | ICD-10-CM

## 2023-06-02 DIAGNOSIS — E1122 Type 2 diabetes mellitus with diabetic chronic kidney disease: Secondary | ICD-10-CM | POA: Diagnosis not present

## 2023-06-02 DIAGNOSIS — G20A1 Parkinson's disease without dyskinesia, without mention of fluctuations: Secondary | ICD-10-CM | POA: Diagnosis not present

## 2023-06-02 DIAGNOSIS — Z794 Long term (current) use of insulin: Secondary | ICD-10-CM | POA: Diagnosis not present

## 2023-06-02 DIAGNOSIS — E039 Hypothyroidism, unspecified: Secondary | ICD-10-CM | POA: Diagnosis not present

## 2023-06-02 DIAGNOSIS — I129 Hypertensive chronic kidney disease with stage 1 through stage 4 chronic kidney disease, or unspecified chronic kidney disease: Secondary | ICD-10-CM | POA: Diagnosis not present

## 2023-06-02 DIAGNOSIS — E785 Hyperlipidemia, unspecified: Secondary | ICD-10-CM | POA: Diagnosis not present

## 2023-06-02 DIAGNOSIS — E559 Vitamin D deficiency, unspecified: Secondary | ICD-10-CM | POA: Diagnosis not present

## 2023-06-02 NOTE — Patient Instructions (Signed)
I am going to have you cut the losartan in half and take 25 mg every evening.   Follow up with Dr. Casimiro Needle in 3 weeks

## 2023-06-02 NOTE — Progress Notes (Signed)
Subjective:    Patient ID: Pamela Patton, female    DOB: 10-31-1945, 77 y.o.   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  Her son is with her today    She is a patient of Dr. Casimiro Needle who I am seeing today for an acute visit. home health PT/OT was at her home this week and noticed that her blood pressure had been low this week ( does not remember the readings). She denies dizziness or lightheadedness but has felt as though her legs were not as strong as they were in the past.   BP Readings from Last 3 Encounters:  06/02/23 (!) 100/58  05/04/23 120/66  01/18/23 (!) 194/85   Wt Readings from Last 3 Encounters:  06/02/23 127 lb (57.6 kg)  05/04/23 126 lb 3.2 oz (57.2 kg)  01/18/23 132 lb 15 oz (60.3 kg)      Review of Systems See HPI   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   Fracture    radial/ulnar 03/2016   GERD (gastroesophageal  reflux disease)    Heart murmur    innocent murmur   High cholesterol    Hypertension associated with diabetes 02/22/2017   Hyperuricemia 10/31/2016   Hypothyroidism    s/p radioactive iodine tx for graves in 2001   Intestinal metaplasia of gastric mucosa    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   Neuromuscular disorder (HCC) 08/08/2016   Parkinson's   UTI (urinary tract infection)    Vitamin D deficiency 08/24/2012   Weight loss    eval with GI in 2016; s/p CT/MRI and PET scans    Social History   Socioeconomic History   Marital status: Single    Spouse name: Not on file   Number of children: 1   Years of education: 20   Highest education level: Professional school degree (e.g., MD, DDS, DVM, JD)  Occupational History   Occupation: retired    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[employer], histology, anatomy and physiology  Tobacco Use   Smoking status: Never   Smokeless tobacco: Never  Vaping Use   Vaping status: Never Used  Substance and Sexual Activity   Alcohol use: No    Alcohol/week: 0.0 standard drinks of alcohol   Drug use: No   Sexual activity: Not Currently    Birth control/protection: Surgical    Comment: ABD Hysterectomy  Other Topics Concern   Not on file  Social History Narrative   Work: recently retired Tree surgeon, 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 Situation:  Lives  alone, no family around, good friendsSpiritual Beliefs: christian, very involved with her church - [employer] churchLifestyle: exercising 5 days per week, trying to eat healthy   Caffeine 1/2 cup daily   Social Determinants of Health   Financial Resource Strain: Low Risk  (06/01/2023)   Overall Financial Resource Strain (CARDIA)    Difficulty of Paying Living Expenses: Not hard at all  Food Insecurity: No Food Insecurity (06/01/2023)   Hunger Vital Sign    Worried About Running Out of Food in the Last Year: Never true    Ran Out of Food in the Last Year: Never true   Transportation Needs: No Transportation Needs (06/01/2023)   PRAPARE - Administrator, Civil Service (Medical): No    Lack of Transportation (Non-Medical): No  Physical Activity: Inactive (01/02/2023)   Exercise Vital Sign    Days of Exercise per Week: 0 days    Minutes of Exercise per Session: 0 min  Stress: Stress Concern Present (06/01/2023)   [employer] of Occupational Health - Occupational Stress Questionnaire    Feeling of Stress : To some extent  Social Connections: Unknown (06/01/2023)   Social Connection and Isolation Panel [NHANES]    Frequency of Communication with Friends and Family: More than three times a week    Frequency of Social Gatherings with Friends and Family: Twice a week    Attends Religious Services: More than 4 times per year    Active Member of [employer] or Organizations: Not on file    Attends Banker Meetings: Not on file    Marital Status: Not on file  Intimate Partner Violence: Not At Risk (12/29/2021)   Humiliation, Afraid, Rape, and Kick questionnaire    Fear of Current or Ex-Partner: No    Emotionally Abused: No    Physically Abused: No    Sexually Abused: No    Past Surgical History:  Procedure Laterality Date   ABDOMINAL HYSTERECTOMY  1989   CATARACT EXTRACTION, BILATERAL     COLONOSCOPY  07/28/2016   x several, last one in 2017   DG BARIUM SWALLOW ([facility] HX)     9 yrs ago per pt due to incomplete colonsocopy   RADIOLOGY WITH ANESTHESIA Left 11/18/2022   Procedure: Left Renal Cryoablation;  Surgeon: Simonne Come, MD;  Location: [facility] OR;  Service: Radiology;  Laterality: Left;   UPPER GI ENDOSCOPY  07/28/2016    Family History  Problem Relation Age of Onset   Cancer Mother        liver   Liver cancer Mother    Stroke Father 48   Diabetes Father    Hypertension Father    Diabetes Maternal Grandmother    Prostate cancer Maternal Grandfather    Pancreatic cancer Brother    Diabetes Sister    Cancer Sister     Diabetes Sister    Colon polyps Neg Hx    Colon cancer Neg Hx    Esophageal cancer Neg Hx    Rectal cancer Neg Hx    Stomach cancer Neg Hx     No Known Allergies  Current Outpatient Medications on File Prior to Visit  Medication Sig Dispense Refill   aspirin 81 MG tablet Take 1 tablet (81 mg total) by mouth at bedtime. Ok to restart 11/20/22 30 tablet 0   atorvastatin (LIPITOR) 40 MG tablet Take 40 mg by mouth at bedtime.     Blood Pressure Monitoring (BLOOD PRESSURE CUFF) MISC 1 Product by Does not apply route  as needed. 1 each 0   Calcium Carbonate-Vitamin D 600-400 MG-UNIT tablet Take 1 tablet by mouth 3 (three) times a week.      carbidopa-levodopa (SINEMET IR) 25-100 MG tablet TAKE TWO TABLETS BY MOUTH EVERY MORNING and TAKE TWO TABLETS BY MOUTH AT NOON and TAKE TWO TABLETS BY MOUTH EVERYDAY AT BEDTIME 540 tablet 0   Cholecalciferol (VITAMIN D PO) Take 5,000 Units by mouth 3 (three) times a week.      Cyanocobalamin (VITAMIN B12 PO) Take 5,000 mcg by mouth 2 (two) times a week.     Glucagon (GVOKE HYPOPEN 2-PACK) 1 MG/0.2ML SOAJ Inject into the skin.     glucose 4 GM chewable tablet Chew 1 tablet by mouth as needed for low blood sugar.     glucose blood (ACCU-CHEK AVIVA PLUS) test strip Use to test blood sugar 4 times a day (Dx: E11.65)     hydrochlorothiazide (HYDRODIURIL) 25 MG tablet Take 25 mg by mouth at bedtime.     insulin glargine (LANTUS) 100 UNIT/ML injection Inject 0.05 mLs (5 Units total) into the skin every evening. (Patient taking differently: Inject 5 Units into the skin at bedtime.) 10 mL    Insulin Syringe-Needle U-100 (INSULIN SYRINGE .3CC/31GX5/16") 31G X 5/16" 0.3 ML MISC Use as directed for Lantus injection once a day     levothyroxine (SYNTHROID, LEVOTHROID) 75 MCG tablet Take 75 mcg by mouth daily before breakfast.     losartan (COZAAR) 50 MG tablet TAKE ONE TABLET BY MOUTH EVERY EVENING 90 tablet 2   NONFORMULARY OR COMPOUNDED ITEM [facility]:   Antifungal - Terbinafine 3%, Fluconazole 2%, Tea Tree Oil 5%, Urea 10%, Ibuprofen 2% in DMSO Suspension #73ml. Apply to the affected nail(s) once (at bedtime) or twice daily. (Patient taking differently: Apply 1 application  topically as needed (Toe nail fungus). [facility]:  Antifungal - Terbinafine 3%, Fluconazole 2%, Tea Tree Oil 5%, Urea 10%, Ibuprofen 2% in DMSO Suspension #23ml. Apply to the affected nail(s) as needed for fungus) 30 each 5   NOVOLOG FLEXPEN 100 UNIT/ML FlexPen Inject 3 Units into the skin 3 (three) times daily with meals. Sliding scale 101-120=1 unit 121-150=2 units 150-200=3 units 201-250= 4 units 251-300=5 units Over 300= units     OneTouch Delica Lancets 33G MISC Use to test blood sugar 4 times a day (Dx: E11.65)     Pioglitazone HCl (ACTOS PO) Take 45 mg by mouth at bedtime.     Polyethyl Glycol-Propyl Glycol (SYSTANE) 0.4-0.3 % GEL ophthalmic gel Place 1 application into both eyes at bedtime as needed (FOR EYES).     No current facility-administered medications on file prior to visit.    BP (!) 100/58   Pulse 62   Temp 98.5 F (36.9 C) (Oral)   Ht 5\' 6"  (1.676 m)   Wt 127 lb (57.6 kg)   SpO2 97%   BMI 20.50 kg/m       Objective:   Physical Exam Vitals and nursing note reviewed.  Constitutional:      Appearance: Normal appearance.  Cardiovascular:     Rate and Rhythm: Normal rate and regular rhythm.     Pulses: Normal pulses.     Heart sounds: Normal heart sounds.  Pulmonary:     Effort: Pulmonary effort is normal.     Breath sounds: Normal breath sounds.  Musculoskeletal:        General: Normal range of motion.     Right lower leg: Edema present.  Left lower leg: Edema present.  Skin:    General: Skin is warm and dry.     Capillary Refill: Capillary refill takes less than 2 seconds.  Neurological:     General: No focal deficit present.     Mental Status: She is alert and oriented to person, place, and time.  Psychiatric:         Mood and Affect: Mood normal.        Behavior: Behavior normal.        Thought Content: Thought content normal.        Judgment: Judgment normal.        Assessment & Plan:  1. Hypotension, unspecified hypotension type - Will have her split her losartan in half and take 25 mg every evening  - Follow up with PCP in the next 3 weeks  Shirline Frees, NP

## 2023-06-06 DIAGNOSIS — I129 Hypertensive chronic kidney disease with stage 1 through stage 4 chronic kidney disease, or unspecified chronic kidney disease: Secondary | ICD-10-CM | POA: Diagnosis not present

## 2023-06-06 DIAGNOSIS — G20A1 Parkinson's disease without dyskinesia, without mention of fluctuations: Secondary | ICD-10-CM | POA: Diagnosis not present

## 2023-06-06 DIAGNOSIS — Z794 Long term (current) use of insulin: Secondary | ICD-10-CM | POA: Diagnosis not present

## 2023-06-06 DIAGNOSIS — E039 Hypothyroidism, unspecified: Secondary | ICD-10-CM | POA: Diagnosis not present

## 2023-06-06 DIAGNOSIS — E1169 Type 2 diabetes mellitus with other specified complication: Secondary | ICD-10-CM | POA: Diagnosis not present

## 2023-06-06 DIAGNOSIS — E785 Hyperlipidemia, unspecified: Secondary | ICD-10-CM | POA: Diagnosis not present

## 2023-06-06 DIAGNOSIS — E559 Vitamin D deficiency, unspecified: Secondary | ICD-10-CM | POA: Diagnosis not present

## 2023-06-06 DIAGNOSIS — N183 Chronic kidney disease, stage 3 unspecified: Secondary | ICD-10-CM | POA: Diagnosis not present

## 2023-06-06 DIAGNOSIS — E1122 Type 2 diabetes mellitus with diabetic chronic kidney disease: Secondary | ICD-10-CM | POA: Diagnosis not present

## 2023-06-07 ENCOUNTER — Ambulatory Visit: Payer: Medicare PPO | Admitting: Family Medicine

## 2023-06-07 DIAGNOSIS — E039 Hypothyroidism, unspecified: Secondary | ICD-10-CM | POA: Diagnosis not present

## 2023-06-07 DIAGNOSIS — E785 Hyperlipidemia, unspecified: Secondary | ICD-10-CM | POA: Diagnosis not present

## 2023-06-07 DIAGNOSIS — Z794 Long term (current) use of insulin: Secondary | ICD-10-CM | POA: Diagnosis not present

## 2023-06-07 DIAGNOSIS — I129 Hypertensive chronic kidney disease with stage 1 through stage 4 chronic kidney disease, or unspecified chronic kidney disease: Secondary | ICD-10-CM | POA: Diagnosis not present

## 2023-06-07 DIAGNOSIS — N183 Chronic kidney disease, stage 3 unspecified: Secondary | ICD-10-CM | POA: Diagnosis not present

## 2023-06-07 DIAGNOSIS — E1122 Type 2 diabetes mellitus with diabetic chronic kidney disease: Secondary | ICD-10-CM | POA: Diagnosis not present

## 2023-06-07 DIAGNOSIS — G20A1 Parkinson's disease without dyskinesia, without mention of fluctuations: Secondary | ICD-10-CM | POA: Diagnosis not present

## 2023-06-07 DIAGNOSIS — E559 Vitamin D deficiency, unspecified: Secondary | ICD-10-CM | POA: Diagnosis not present

## 2023-06-07 DIAGNOSIS — E1169 Type 2 diabetes mellitus with other specified complication: Secondary | ICD-10-CM | POA: Diagnosis not present

## 2023-06-09 DIAGNOSIS — E1169 Type 2 diabetes mellitus with other specified complication: Secondary | ICD-10-CM | POA: Diagnosis not present

## 2023-06-09 DIAGNOSIS — I129 Hypertensive chronic kidney disease with stage 1 through stage 4 chronic kidney disease, or unspecified chronic kidney disease: Secondary | ICD-10-CM | POA: Diagnosis not present

## 2023-06-09 DIAGNOSIS — Z794 Long term (current) use of insulin: Secondary | ICD-10-CM | POA: Diagnosis not present

## 2023-06-09 DIAGNOSIS — N183 Chronic kidney disease, stage 3 unspecified: Secondary | ICD-10-CM | POA: Diagnosis not present

## 2023-06-09 DIAGNOSIS — E1122 Type 2 diabetes mellitus with diabetic chronic kidney disease: Secondary | ICD-10-CM | POA: Diagnosis not present

## 2023-06-09 DIAGNOSIS — E559 Vitamin D deficiency, unspecified: Secondary | ICD-10-CM | POA: Diagnosis not present

## 2023-06-09 DIAGNOSIS — G20A1 Parkinson's disease without dyskinesia, without mention of fluctuations: Secondary | ICD-10-CM | POA: Diagnosis not present

## 2023-06-09 DIAGNOSIS — E785 Hyperlipidemia, unspecified: Secondary | ICD-10-CM | POA: Diagnosis not present

## 2023-06-09 DIAGNOSIS — E039 Hypothyroidism, unspecified: Secondary | ICD-10-CM | POA: Diagnosis not present

## 2023-06-12 DIAGNOSIS — E1169 Type 2 diabetes mellitus with other specified complication: Secondary | ICD-10-CM | POA: Diagnosis not present

## 2023-06-12 DIAGNOSIS — E559 Vitamin D deficiency, unspecified: Secondary | ICD-10-CM | POA: Diagnosis not present

## 2023-06-12 DIAGNOSIS — E1122 Type 2 diabetes mellitus with diabetic chronic kidney disease: Secondary | ICD-10-CM | POA: Diagnosis not present

## 2023-06-12 DIAGNOSIS — I129 Hypertensive chronic kidney disease with stage 1 through stage 4 chronic kidney disease, or unspecified chronic kidney disease: Secondary | ICD-10-CM | POA: Diagnosis not present

## 2023-06-12 DIAGNOSIS — E785 Hyperlipidemia, unspecified: Secondary | ICD-10-CM | POA: Diagnosis not present

## 2023-06-12 DIAGNOSIS — N183 Chronic kidney disease, stage 3 unspecified: Secondary | ICD-10-CM | POA: Diagnosis not present

## 2023-06-12 DIAGNOSIS — G20A1 Parkinson's disease without dyskinesia, without mention of fluctuations: Secondary | ICD-10-CM | POA: Diagnosis not present

## 2023-06-12 DIAGNOSIS — E039 Hypothyroidism, unspecified: Secondary | ICD-10-CM | POA: Diagnosis not present

## 2023-06-12 DIAGNOSIS — Z794 Long term (current) use of insulin: Secondary | ICD-10-CM | POA: Diagnosis not present

## 2023-06-15 DIAGNOSIS — E785 Hyperlipidemia, unspecified: Secondary | ICD-10-CM | POA: Diagnosis not present

## 2023-06-15 DIAGNOSIS — N183 Chronic kidney disease, stage 3 unspecified: Secondary | ICD-10-CM | POA: Diagnosis not present

## 2023-06-15 DIAGNOSIS — I129 Hypertensive chronic kidney disease with stage 1 through stage 4 chronic kidney disease, or unspecified chronic kidney disease: Secondary | ICD-10-CM | POA: Diagnosis not present

## 2023-06-15 DIAGNOSIS — E039 Hypothyroidism, unspecified: Secondary | ICD-10-CM | POA: Diagnosis not present

## 2023-06-15 DIAGNOSIS — E1122 Type 2 diabetes mellitus with diabetic chronic kidney disease: Secondary | ICD-10-CM | POA: Diagnosis not present

## 2023-06-15 DIAGNOSIS — E1169 Type 2 diabetes mellitus with other specified complication: Secondary | ICD-10-CM | POA: Diagnosis not present

## 2023-06-15 DIAGNOSIS — E559 Vitamin D deficiency, unspecified: Secondary | ICD-10-CM | POA: Diagnosis not present

## 2023-06-15 DIAGNOSIS — G20A1 Parkinson's disease without dyskinesia, without mention of fluctuations: Secondary | ICD-10-CM | POA: Diagnosis not present

## 2023-06-15 DIAGNOSIS — Z794 Long term (current) use of insulin: Secondary | ICD-10-CM | POA: Diagnosis not present

## 2023-06-16 DIAGNOSIS — E1165 Type 2 diabetes mellitus with hyperglycemia: Secondary | ICD-10-CM | POA: Diagnosis not present

## 2023-06-19 DIAGNOSIS — Z794 Long term (current) use of insulin: Secondary | ICD-10-CM | POA: Diagnosis not present

## 2023-06-19 DIAGNOSIS — E039 Hypothyroidism, unspecified: Secondary | ICD-10-CM | POA: Diagnosis not present

## 2023-06-19 DIAGNOSIS — I129 Hypertensive chronic kidney disease with stage 1 through stage 4 chronic kidney disease, or unspecified chronic kidney disease: Secondary | ICD-10-CM | POA: Diagnosis not present

## 2023-06-19 DIAGNOSIS — E785 Hyperlipidemia, unspecified: Secondary | ICD-10-CM | POA: Diagnosis not present

## 2023-06-19 DIAGNOSIS — E1169 Type 2 diabetes mellitus with other specified complication: Secondary | ICD-10-CM | POA: Diagnosis not present

## 2023-06-19 DIAGNOSIS — N183 Chronic kidney disease, stage 3 unspecified: Secondary | ICD-10-CM | POA: Diagnosis not present

## 2023-06-19 DIAGNOSIS — E1122 Type 2 diabetes mellitus with diabetic chronic kidney disease: Secondary | ICD-10-CM | POA: Diagnosis not present

## 2023-06-19 DIAGNOSIS — E559 Vitamin D deficiency, unspecified: Secondary | ICD-10-CM | POA: Diagnosis not present

## 2023-06-19 DIAGNOSIS — G20A1 Parkinson's disease without dyskinesia, without mention of fluctuations: Secondary | ICD-10-CM | POA: Diagnosis not present

## 2023-06-20 DIAGNOSIS — Z794 Long term (current) use of insulin: Secondary | ICD-10-CM | POA: Diagnosis not present

## 2023-06-20 DIAGNOSIS — N183 Chronic kidney disease, stage 3 unspecified: Secondary | ICD-10-CM | POA: Diagnosis not present

## 2023-06-20 DIAGNOSIS — E1122 Type 2 diabetes mellitus with diabetic chronic kidney disease: Secondary | ICD-10-CM | POA: Diagnosis not present

## 2023-06-20 DIAGNOSIS — E785 Hyperlipidemia, unspecified: Secondary | ICD-10-CM | POA: Diagnosis not present

## 2023-06-20 DIAGNOSIS — G20A1 Parkinson's disease without dyskinesia, without mention of fluctuations: Secondary | ICD-10-CM | POA: Diagnosis not present

## 2023-06-20 DIAGNOSIS — E559 Vitamin D deficiency, unspecified: Secondary | ICD-10-CM | POA: Diagnosis not present

## 2023-06-20 DIAGNOSIS — E039 Hypothyroidism, unspecified: Secondary | ICD-10-CM | POA: Diagnosis not present

## 2023-06-20 DIAGNOSIS — E1169 Type 2 diabetes mellitus with other specified complication: Secondary | ICD-10-CM | POA: Diagnosis not present

## 2023-06-20 DIAGNOSIS — I129 Hypertensive chronic kidney disease with stage 1 through stage 4 chronic kidney disease, or unspecified chronic kidney disease: Secondary | ICD-10-CM | POA: Diagnosis not present

## 2023-06-26 DIAGNOSIS — G20A1 Parkinson's disease without dyskinesia, without mention of fluctuations: Secondary | ICD-10-CM | POA: Diagnosis not present

## 2023-06-26 DIAGNOSIS — E1169 Type 2 diabetes mellitus with other specified complication: Secondary | ICD-10-CM | POA: Diagnosis not present

## 2023-06-26 DIAGNOSIS — E559 Vitamin D deficiency, unspecified: Secondary | ICD-10-CM | POA: Diagnosis not present

## 2023-06-26 DIAGNOSIS — I129 Hypertensive chronic kidney disease with stage 1 through stage 4 chronic kidney disease, or unspecified chronic kidney disease: Secondary | ICD-10-CM | POA: Diagnosis not present

## 2023-06-26 DIAGNOSIS — Z794 Long term (current) use of insulin: Secondary | ICD-10-CM | POA: Diagnosis not present

## 2023-06-26 DIAGNOSIS — E039 Hypothyroidism, unspecified: Secondary | ICD-10-CM | POA: Diagnosis not present

## 2023-06-26 DIAGNOSIS — E785 Hyperlipidemia, unspecified: Secondary | ICD-10-CM | POA: Diagnosis not present

## 2023-06-26 DIAGNOSIS — N183 Chronic kidney disease, stage 3 unspecified: Secondary | ICD-10-CM | POA: Diagnosis not present

## 2023-06-26 DIAGNOSIS — E1122 Type 2 diabetes mellitus with diabetic chronic kidney disease: Secondary | ICD-10-CM | POA: Diagnosis not present

## 2023-07-05 DIAGNOSIS — N183 Chronic kidney disease, stage 3 unspecified: Secondary | ICD-10-CM | POA: Diagnosis not present

## 2023-07-05 DIAGNOSIS — E559 Vitamin D deficiency, unspecified: Secondary | ICD-10-CM | POA: Diagnosis not present

## 2023-07-05 DIAGNOSIS — E785 Hyperlipidemia, unspecified: Secondary | ICD-10-CM | POA: Diagnosis not present

## 2023-07-05 DIAGNOSIS — Z794 Long term (current) use of insulin: Secondary | ICD-10-CM | POA: Diagnosis not present

## 2023-07-05 DIAGNOSIS — E039 Hypothyroidism, unspecified: Secondary | ICD-10-CM | POA: Diagnosis not present

## 2023-07-05 DIAGNOSIS — I129 Hypertensive chronic kidney disease with stage 1 through stage 4 chronic kidney disease, or unspecified chronic kidney disease: Secondary | ICD-10-CM | POA: Diagnosis not present

## 2023-07-05 DIAGNOSIS — E1169 Type 2 diabetes mellitus with other specified complication: Secondary | ICD-10-CM | POA: Diagnosis not present

## 2023-07-05 DIAGNOSIS — E1122 Type 2 diabetes mellitus with diabetic chronic kidney disease: Secondary | ICD-10-CM | POA: Diagnosis not present

## 2023-07-05 DIAGNOSIS — G20A1 Parkinson's disease without dyskinesia, without mention of fluctuations: Secondary | ICD-10-CM | POA: Diagnosis not present

## 2023-07-06 ENCOUNTER — Telehealth: Payer: Self-pay | Admitting: Neurology

## 2023-07-06 DIAGNOSIS — I129 Hypertensive chronic kidney disease with stage 1 through stage 4 chronic kidney disease, or unspecified chronic kidney disease: Secondary | ICD-10-CM | POA: Diagnosis not present

## 2023-07-06 DIAGNOSIS — N183 Chronic kidney disease, stage 3 unspecified: Secondary | ICD-10-CM | POA: Diagnosis not present

## 2023-07-06 DIAGNOSIS — E039 Hypothyroidism, unspecified: Secondary | ICD-10-CM | POA: Diagnosis not present

## 2023-07-06 DIAGNOSIS — E559 Vitamin D deficiency, unspecified: Secondary | ICD-10-CM | POA: Diagnosis not present

## 2023-07-06 DIAGNOSIS — E785 Hyperlipidemia, unspecified: Secondary | ICD-10-CM | POA: Diagnosis not present

## 2023-07-06 DIAGNOSIS — G20A1 Parkinson's disease without dyskinesia, without mention of fluctuations: Secondary | ICD-10-CM | POA: Diagnosis not present

## 2023-07-06 DIAGNOSIS — E1169 Type 2 diabetes mellitus with other specified complication: Secondary | ICD-10-CM | POA: Diagnosis not present

## 2023-07-06 DIAGNOSIS — Z794 Long term (current) use of insulin: Secondary | ICD-10-CM | POA: Diagnosis not present

## 2023-07-06 DIAGNOSIS — E1122 Type 2 diabetes mellitus with diabetic chronic kidney disease: Secondary | ICD-10-CM | POA: Diagnosis not present

## 2023-07-06 NOTE — Telephone Encounter (Signed)
[facility] called in needing home health PT 1 a week for 9 weeks.

## 2023-07-06 NOTE — Telephone Encounter (Signed)
Called and gave orders  

## 2023-07-13 ENCOUNTER — Ambulatory Visit: Payer: Medicare PPO | Admitting: Family Medicine

## 2023-07-13 ENCOUNTER — Encounter: Payer: Self-pay | Admitting: Family Medicine

## 2023-07-13 VITALS — BP 96/46 | HR 69 | Temp 99.1°F | Ht 66.0 in | Wt 125.4 lb

## 2023-07-13 DIAGNOSIS — I959 Hypotension, unspecified: Secondary | ICD-10-CM

## 2023-07-13 DIAGNOSIS — I10 Essential (primary) hypertension: Secondary | ICD-10-CM | POA: Diagnosis not present

## 2023-07-13 MED ORDER — LOSARTAN POTASSIUM 50 MG PO TABS
25.0000 mg | ORAL_TABLET | Freq: Every evening | ORAL | 1 refills | Status: DC
Start: 2023-07-13 — End: 2023-08-29

## 2023-07-13 NOTE — Progress Notes (Signed)
Established Patient Office Visit  Subjective   Patient ID: Pamela Patton, female    DOB: [birth year]  Age: 77 y.o. MRN: [id number]  Chief Complaint  Patient presents with   Medical Management of Chronic Issues    Follow up on low bp pressure    Pt is here for follow up on her BP-- was seen in 8/30 due to low blood pressure readings at home, states that the PT person was coming out to her house and they had checked her BP which was low. Pt denies any dizziness or lightheadedness but feeling a little more weak in the legs and feeling more sleepy. Her losartan was reduced by 50%, states that she thinks it might have made this a little better, however today her B remains 96/46.     Current Outpatient Medications  Medication Instructions   aspirin 81 mg, Oral, Daily at bedtime, Ok to restart 11/20/22   atorvastatin (LIPITOR) 40 mg, Oral, Daily at bedtime   Blood Pressure Monitoring (BLOOD PRESSURE CUFF) MISC 1 Product, Does not apply, As needed   Calcium Carbonate-Vitamin D 600-400 MG-UNIT tablet 1 tablet, Oral, 3 times weekly   carbidopa-levodopa (SINEMET IR) 25-100 MG tablet TAKE TWO TABLETS BY MOUTH EVERY MORNING and TAKE TWO TABLETS BY MOUTH AT NOON and TAKE TWO TABLETS BY MOUTH EVERYDAY AT BEDTIME   Cholecalciferol (VITAMIN D PO) 5,000 Units, Oral, 3 times weekly   Cyanocobalamin (VITAMIN B12 PO) 5,000 mcg, Oral, 2 times weekly   Glucagon (GVOKE HYPOPEN 2-PACK) 1 MG/0.2ML SOAJ Subcutaneous, As needed   glucose 4 GM chewable tablet 1 tablet, Oral, As needed   glucose blood (ACCU-CHEK AVIVA PLUS) test strip Use to test blood sugar 4 times a day (Dx: E11.65)   hydrochlorothiazide (HYDRODIURIL) 12.5 mg, Oral, Daily   insulin glargine (LANTUS) 5 Units, Subcutaneous, Every evening   Insulin Syringe-Needle U-100 (INSULIN SYRINGE .3CC/31GX5/16") 31G X 5/16" 0.3 ML MISC Use as directed for Lantus injection once a day   levothyroxine (SYNTHROID) 75 mcg, Oral, Daily before breakfast   losartan  (COZAAR) 25 mg, Oral, Every evening   NONFORMULARY OR COMPOUNDED ITEM [facility]:  Antifungal - Terbinafine 3%, Fluconazole 2%, Tea Tree Oil 5%, Urea 10%, Ibuprofen 2% in DMSO Suspension #74ml. Apply to the affected nail(s) once (at bedtime) or twice daily.   NovoLOG FlexPen 3 Units, Subcutaneous, 3 times daily with meals, Sliding scale<BR>101-120=1 unit<BR>121-150=2 units<BR>150-200=3 units<BR>201-250= 4 units<BR>251-300=5 units<BR>Over 300= units   OneTouch Delica Lancets 33G MISC Use to test blood sugar 4 times a day (Dx: E11.65)   Pioglitazone HCl (ACTOS PO) 45 mg, Oral, Daily at bedtime   Polyethyl Glycol-Propyl Glycol (SYSTANE) 0.4-0.3 % GEL ophthalmic gel 1 application , Both Eyes, At bedtime PRN    Patient Active Problem List   Diagnosis Date Noted   Renal cell carcinoma (HCC) 11/18/2022   Renal cell carcinoma of left kidney (HCC) 11/18/2022   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   Carotid artery disease (HCC) 03/21/2017   HTN (hypertension) 02/22/2017   Hyperlipidemia associated with type 2 diabetes mellitus (HCC) 02/22/2017   Hyperuricemia 10/31/2016   PD (Parkinson's disease) (HCC) 08/08/2016   Loss of weight 07/25/2016   DM (diabetes mellitus), type 2 with renal complications 10/08/2012   CKD (chronic kidney disease) stage 3, GFR 30-59 ml/min (HCC) 10/08/2012   Vitamin D deficiency 08/24/2012   Hypothyroid, s/p radioactive iodine tx for graves disease       Review of Systems  All other systems reviewed and are negative.     Objective:     BP (!) 96/46 (BP Location: Left Arm, Patient Position: Sitting, Cuff Size: Normal)   Pulse 69   Temp 99.1 F (37.3 C) (Oral)   Ht 5\' 6"  (1.676 m)   Wt 125 lb 6.4 oz (56.9 kg)   SpO2 98%   BMI 20.24 kg/m    Physical Exam Vitals reviewed.  Constitutional:      Appearance: Normal appearance. She is normal weight.  Cardiovascular:     Rate and Rhythm: Normal rate and  regular rhythm.     Heart sounds: Murmur heard.  Pulmonary:     Effort: Pulmonary effort is normal.     Breath sounds: Normal breath sounds. No wheezing.  Neurological:     Mental Status: She is alert and oriented to person, place, and time. Mental status is at baseline.  Psychiatric:        Mood and Affect: Mood normal.        Behavior: Behavior normal.      Results for orders placed or performed in visit on 07/13/23  Hemoglobin A1c  Result Value Ref Range   Hemoglobin A1C 7.8       The 10-year ASCVD risk score (Arnett DK, et al., 2019) is: 20.3%    Assessment & Plan:  Hypotension, unspecified hypotension type  Primary hypertension Assessment & Plan: BP remains low despite lowering the dose of losartan to 25 mg daily. She is also taking 12.5 mg HTCZ daily for HTN. She is currently asymptomatic. I advise we continue to watch her BP at home and looks for any signs of symptoms. I advised her that if she is having symptoms then she may stop her losartan altogether.   Orders: -     Losartan Potassium; Take 0.5 tablets (25 mg total) by mouth every evening.  Dispense: 45 tablet; Refill: 1     Return in about 6 months (around 01/11/2024) for follow up.    [name], MD

## 2023-07-13 NOTE — Patient Instructions (Signed)
Decrease hydrochlorothiazide to 12.5 mg (1/2 tablet) daily  Continue losartan at 25 mg daily (1/2 tablet).

## 2023-07-19 NOTE — Assessment & Plan Note (Signed)
BP remains low despite lowering the dose of losartan to 25 mg daily. She is also taking 12.5 mg HTCZ daily for HTN. She is currently asymptomatic. I advise we continue to watch her BP at home and looks for any signs of symptoms. I advised her that if she is having symptoms then she may stop her losartan altogether.

## 2023-08-07 DIAGNOSIS — I129 Hypertensive chronic kidney disease with stage 1 through stage 4 chronic kidney disease, or unspecified chronic kidney disease: Secondary | ICD-10-CM | POA: Diagnosis not present

## 2023-08-07 DIAGNOSIS — L84 Corns and callosities: Secondary | ICD-10-CM | POA: Diagnosis not present

## 2023-08-07 DIAGNOSIS — E113293 Type 2 diabetes mellitus with mild nonproliferative diabetic retinopathy without macular edema, bilateral: Secondary | ICD-10-CM | POA: Diagnosis not present

## 2023-08-07 DIAGNOSIS — Z794 Long term (current) use of insulin: Secondary | ICD-10-CM | POA: Diagnosis not present

## 2023-08-07 DIAGNOSIS — E1165 Type 2 diabetes mellitus with hyperglycemia: Secondary | ICD-10-CM | POA: Diagnosis not present

## 2023-08-07 DIAGNOSIS — E11649 Type 2 diabetes mellitus with hypoglycemia without coma: Secondary | ICD-10-CM | POA: Diagnosis not present

## 2023-08-07 DIAGNOSIS — E11628 Type 2 diabetes mellitus with other skin complications: Secondary | ICD-10-CM | POA: Diagnosis not present

## 2023-08-07 DIAGNOSIS — E1143 Type 2 diabetes mellitus with diabetic autonomic (poly)neuropathy: Secondary | ICD-10-CM | POA: Diagnosis not present

## 2023-08-07 DIAGNOSIS — E1122 Type 2 diabetes mellitus with diabetic chronic kidney disease: Secondary | ICD-10-CM | POA: Diagnosis not present

## 2023-08-09 ENCOUNTER — Telehealth: Payer: Self-pay

## 2023-08-09 NOTE — Patient Outreach (Signed)
  Care Management   Outreach Note  08/09/2023 Name: Pamela Patton MRN: [id number] DOB: 05-Mar-1946  An unsuccessful telephone outreach was attempted today to contact the patient about Care Management needs.     Follow Up Plan:  A HIPAA compliant phone message was left for the patient providing contact information and requesting a return call.    [facility]  [facility], [facility] RN Care Manager Direct Dial: [phone] Website: [email or web address]

## 2023-08-11 DIAGNOSIS — I129 Hypertensive chronic kidney disease with stage 1 through stage 4 chronic kidney disease, or unspecified chronic kidney disease: Secondary | ICD-10-CM | POA: Diagnosis not present

## 2023-08-11 DIAGNOSIS — E1165 Type 2 diabetes mellitus with hyperglycemia: Secondary | ICD-10-CM | POA: Diagnosis not present

## 2023-08-11 DIAGNOSIS — E11628 Type 2 diabetes mellitus with other skin complications: Secondary | ICD-10-CM | POA: Diagnosis not present

## 2023-08-11 DIAGNOSIS — N1832 Chronic kidney disease, stage 3b: Secondary | ICD-10-CM | POA: Diagnosis not present

## 2023-08-11 DIAGNOSIS — L84 Corns and callosities: Secondary | ICD-10-CM | POA: Diagnosis not present

## 2023-08-11 DIAGNOSIS — E1122 Type 2 diabetes mellitus with diabetic chronic kidney disease: Secondary | ICD-10-CM | POA: Diagnosis not present

## 2023-08-11 DIAGNOSIS — E1143 Type 2 diabetes mellitus with diabetic autonomic (poly)neuropathy: Secondary | ICD-10-CM | POA: Diagnosis not present

## 2023-08-11 DIAGNOSIS — E890 Postprocedural hypothyroidism: Secondary | ICD-10-CM | POA: Diagnosis not present

## 2023-08-11 DIAGNOSIS — E113293 Type 2 diabetes mellitus with mild nonproliferative diabetic retinopathy without macular edema, bilateral: Secondary | ICD-10-CM | POA: Diagnosis not present

## 2023-08-11 DIAGNOSIS — Z794 Long term (current) use of insulin: Secondary | ICD-10-CM | POA: Diagnosis not present

## 2023-08-11 DIAGNOSIS — E11649 Type 2 diabetes mellitus with hypoglycemia without coma: Secondary | ICD-10-CM | POA: Diagnosis not present

## 2023-08-17 ENCOUNTER — Ambulatory Visit
Admission: RE | Admit: 2023-08-17 | Discharge: 2023-08-17 | Disposition: A | Discharge status: Home / Self Care | Payer: Medicare PPO | Source: Ambulatory Visit | Attending: Internal Medicine | Admitting: Internal Medicine

## 2023-08-17 DIAGNOSIS — Z90722 Acquired absence of ovaries, bilateral: Secondary | ICD-10-CM | POA: Diagnosis not present

## 2023-08-17 DIAGNOSIS — E2839 Other primary ovarian failure: Secondary | ICD-10-CM | POA: Diagnosis not present

## 2023-08-17 DIAGNOSIS — N958 Other specified menopausal and perimenopausal disorders: Secondary | ICD-10-CM | POA: Diagnosis not present

## 2023-08-17 DIAGNOSIS — M810 Age-related osteoporosis without current pathological fracture: Secondary | ICD-10-CM

## 2023-08-17 DIAGNOSIS — M8588 Other specified disorders of bone density and structure, other site: Secondary | ICD-10-CM | POA: Diagnosis not present

## 2023-08-21 ENCOUNTER — Emergency Department (HOSPITAL_COMMUNITY): Payer: Medicare PPO

## 2023-08-21 ENCOUNTER — Encounter (HOSPITAL_COMMUNITY): Payer: Self-pay | Admitting: Emergency Medicine

## 2023-08-21 ENCOUNTER — Other Ambulatory Visit: Payer: Self-pay

## 2023-08-21 ENCOUNTER — Ambulatory Visit
Admission: RE | Admit: 2023-08-21 | Discharge: 2023-08-21 | Disposition: A | Discharge status: Home / Self Care | Payer: Medicare PPO | Source: Ambulatory Visit | Attending: Endocrinology | Admitting: Endocrinology

## 2023-08-21 ENCOUNTER — Inpatient Hospital Stay (HOSPITAL_COMMUNITY)
Admission: EM | Admit: 2023-08-21 | Discharge: 2023-08-29 | DRG: 565 | Disposition: A | Discharge status: Skilled Nursing Facility | Payer: Medicare PPO | Attending: Internal Medicine | Admitting: Internal Medicine

## 2023-08-21 VITALS — BP 105/65 | HR 92 | Temp 98.4°F | Resp 16

## 2023-08-21 DIAGNOSIS — R471 Dysarthria and anarthria: Secondary | ICD-10-CM | POA: Diagnosis present

## 2023-08-21 DIAGNOSIS — Z79899 Other long term (current) drug therapy: Secondary | ICD-10-CM | POA: Diagnosis not present

## 2023-08-21 DIAGNOSIS — N179 Acute kidney failure, unspecified: Secondary | ICD-10-CM | POA: Diagnosis present

## 2023-08-21 DIAGNOSIS — E039 Hypothyroidism, unspecified: Secondary | ICD-10-CM | POA: Diagnosis not present

## 2023-08-21 DIAGNOSIS — Z7401 Bed confinement status: Secondary | ICD-10-CM | POA: Diagnosis not present

## 2023-08-21 DIAGNOSIS — E782 Mixed hyperlipidemia: Secondary | ICD-10-CM

## 2023-08-21 DIAGNOSIS — S31000A Unspecified open wound of lower back and pelvis without penetration into retroperitoneum, initial encounter: Secondary | ICD-10-CM | POA: Diagnosis present

## 2023-08-21 DIAGNOSIS — Z823 Family history of stroke: Secondary | ICD-10-CM

## 2023-08-21 DIAGNOSIS — R7989 Other specified abnormal findings of blood chemistry: Secondary | ICD-10-CM | POA: Diagnosis not present

## 2023-08-21 DIAGNOSIS — R41841 Cognitive communication deficit: Secondary | ICD-10-CM | POA: Diagnosis not present

## 2023-08-21 DIAGNOSIS — Y92009 Unspecified place in unspecified non-institutional (private) residence as the place of occurrence of the external cause: Secondary | ICD-10-CM

## 2023-08-21 DIAGNOSIS — T796XXA Traumatic ischemia of muscle, initial encounter: Secondary | ICD-10-CM | POA: Diagnosis not present

## 2023-08-21 DIAGNOSIS — N183 Chronic kidney disease, stage 3 unspecified: Secondary | ICD-10-CM | POA: Diagnosis present

## 2023-08-21 DIAGNOSIS — S31000D Unspecified open wound of lower back and pelvis without penetration into retroperitoneum, subsequent encounter: Secondary | ICD-10-CM | POA: Diagnosis not present

## 2023-08-21 DIAGNOSIS — Z800 Family history of malignant neoplasm of digestive organs: Secondary | ICD-10-CM

## 2023-08-21 DIAGNOSIS — R7401 Elevation of levels of liver transaminase levels: Secondary | ICD-10-CM | POA: Diagnosis not present

## 2023-08-21 DIAGNOSIS — Z90710 Acquired absence of both cervix and uterus: Secondary | ICD-10-CM

## 2023-08-21 DIAGNOSIS — R9082 White matter disease, unspecified: Secondary | ICD-10-CM | POA: Diagnosis not present

## 2023-08-21 DIAGNOSIS — W19XXXA Unspecified fall, initial encounter: Secondary | ICD-10-CM

## 2023-08-21 DIAGNOSIS — Z8249 Family history of ischemic heart disease and other diseases of the circulatory system: Secondary | ICD-10-CM | POA: Diagnosis not present

## 2023-08-21 DIAGNOSIS — I152 Hypertension secondary to endocrine disorders: Secondary | ICD-10-CM | POA: Diagnosis not present

## 2023-08-21 DIAGNOSIS — Z79890 Hormone replacement therapy (postmenopausal): Secondary | ICD-10-CM

## 2023-08-21 DIAGNOSIS — R531 Weakness: Secondary | ICD-10-CM | POA: Diagnosis not present

## 2023-08-21 DIAGNOSIS — E7800 Pure hypercholesterolemia, unspecified: Secondary | ICD-10-CM | POA: Diagnosis present

## 2023-08-21 DIAGNOSIS — T428X5A Adverse effect of antiparkinsonism drugs and other central muscle-tone depressants, initial encounter: Secondary | ICD-10-CM | POA: Diagnosis present

## 2023-08-21 DIAGNOSIS — R627 Adult failure to thrive: Principal | ICD-10-CM | POA: Diagnosis present

## 2023-08-21 DIAGNOSIS — R410 Disorientation, unspecified: Secondary | ICD-10-CM | POA: Diagnosis not present

## 2023-08-21 DIAGNOSIS — D638 Anemia in other chronic diseases classified elsewhere: Secondary | ICD-10-CM | POA: Diagnosis not present

## 2023-08-21 DIAGNOSIS — I10 Essential (primary) hypertension: Secondary | ICD-10-CM | POA: Diagnosis present

## 2023-08-21 DIAGNOSIS — Z7982 Long term (current) use of aspirin: Secondary | ICD-10-CM

## 2023-08-21 DIAGNOSIS — K219 Gastro-esophageal reflux disease without esophagitis: Secondary | ICD-10-CM | POA: Diagnosis present

## 2023-08-21 DIAGNOSIS — Z9181 History of falling: Secondary | ICD-10-CM

## 2023-08-21 DIAGNOSIS — Z794 Long term (current) use of insulin: Secondary | ICD-10-CM

## 2023-08-21 DIAGNOSIS — Z1152 Encounter for screening for COVID-19: Secondary | ICD-10-CM

## 2023-08-21 DIAGNOSIS — E1165 Type 2 diabetes mellitus with hyperglycemia: Secondary | ICD-10-CM | POA: Diagnosis not present

## 2023-08-21 DIAGNOSIS — E1122 Type 2 diabetes mellitus with diabetic chronic kidney disease: Secondary | ICD-10-CM | POA: Diagnosis present

## 2023-08-21 DIAGNOSIS — M1712 Unilateral primary osteoarthritis, left knee: Secondary | ICD-10-CM | POA: Diagnosis not present

## 2023-08-21 DIAGNOSIS — M160 Bilateral primary osteoarthritis of hip: Secondary | ICD-10-CM | POA: Diagnosis not present

## 2023-08-21 DIAGNOSIS — M1711 Unilateral primary osteoarthritis, right knee: Secondary | ICD-10-CM | POA: Diagnosis not present

## 2023-08-21 DIAGNOSIS — M25569 Pain in unspecified knee: Secondary | ICD-10-CM

## 2023-08-21 DIAGNOSIS — E119 Type 2 diabetes mellitus without complications: Secondary | ICD-10-CM | POA: Diagnosis not present

## 2023-08-21 DIAGNOSIS — L89151 Pressure ulcer of sacral region, stage 1: Secondary | ICD-10-CM | POA: Diagnosis present

## 2023-08-21 DIAGNOSIS — D72829 Elevated white blood cell count, unspecified: Secondary | ICD-10-CM | POA: Diagnosis not present

## 2023-08-21 DIAGNOSIS — D631 Anemia in chronic kidney disease: Secondary | ICD-10-CM | POA: Diagnosis not present

## 2023-08-21 DIAGNOSIS — N281 Cyst of kidney, acquired: Secondary | ICD-10-CM | POA: Diagnosis not present

## 2023-08-21 DIAGNOSIS — T796XXS Traumatic ischemia of muscle, sequela: Secondary | ICD-10-CM | POA: Diagnosis not present

## 2023-08-21 DIAGNOSIS — Z043 Encounter for examination and observation following other accident: Secondary | ICD-10-CM | POA: Diagnosis not present

## 2023-08-21 DIAGNOSIS — M6281 Muscle weakness (generalized): Secondary | ICD-10-CM | POA: Diagnosis not present

## 2023-08-21 DIAGNOSIS — M6282 Rhabdomyolysis: Principal | ICD-10-CM | POA: Diagnosis present

## 2023-08-21 DIAGNOSIS — Z602 Problems related to living alone: Secondary | ICD-10-CM | POA: Diagnosis present

## 2023-08-21 DIAGNOSIS — G20A1 Parkinson's disease without dyskinesia, without mention of fluctuations: Secondary | ICD-10-CM | POA: Diagnosis not present

## 2023-08-21 DIAGNOSIS — R278 Other lack of coordination: Secondary | ICD-10-CM | POA: Diagnosis not present

## 2023-08-21 DIAGNOSIS — E1169 Type 2 diabetes mellitus with other specified complication: Secondary | ICD-10-CM | POA: Diagnosis present

## 2023-08-21 DIAGNOSIS — W010XXA Fall on same level from slipping, tripping and stumbling without subsequent striking against object, initial encounter: Secondary | ICD-10-CM | POA: Diagnosis present

## 2023-08-21 DIAGNOSIS — R296 Repeated falls: Secondary | ICD-10-CM | POA: Diagnosis present

## 2023-08-21 DIAGNOSIS — Z833 Family history of diabetes mellitus: Secondary | ICD-10-CM

## 2023-08-21 DIAGNOSIS — E860 Dehydration: Secondary | ICD-10-CM | POA: Diagnosis not present

## 2023-08-21 DIAGNOSIS — R2689 Other abnormalities of gait and mobility: Secondary | ICD-10-CM | POA: Diagnosis not present

## 2023-08-21 DIAGNOSIS — E785 Hyperlipidemia, unspecified: Secondary | ICD-10-CM | POA: Diagnosis present

## 2023-08-21 DIAGNOSIS — M25461 Effusion, right knee: Secondary | ICD-10-CM | POA: Diagnosis not present

## 2023-08-21 DIAGNOSIS — R001 Bradycardia, unspecified: Secondary | ICD-10-CM | POA: Diagnosis not present

## 2023-08-21 DIAGNOSIS — K828 Other specified diseases of gallbladder: Secondary | ICD-10-CM | POA: Diagnosis not present

## 2023-08-21 DIAGNOSIS — I771 Stricture of artery: Secondary | ICD-10-CM | POA: Diagnosis not present

## 2023-08-21 LAB — COMPREHENSIVE METABOLIC PANEL
ALT: 8 U/L (ref 0–44)
AST: 114 U/L — ABNORMAL HIGH (ref 15–41)
Albumin: 4.1 g/dL (ref 3.5–5.0)
Alkaline Phosphatase: 50 U/L (ref 38–126)
Anion gap: 12 (ref 5–15)
BUN: 47 mg/dL — ABNORMAL HIGH (ref 8–23)
CO2: 23 mmol/L (ref 22–32)
Calcium: 9.5 mg/dL (ref 8.9–10.3)
Chloride: 108 mmol/L (ref 98–111)
Creatinine, Ser: 1.62 mg/dL — ABNORMAL HIGH (ref 0.44–1.00)
GFR, Estimated: 32 mL/min — ABNORMAL LOW (ref >60)
Glucose, Bld: 199 mg/dL — ABNORMAL HIGH (ref 70–99)
Potassium: 4.2 mmol/L (ref 3.5–5.1)
Sodium: 143 mmol/L (ref 135–145)
Total Bilirubin: 1.1 mg/dL (ref <1.2)
Total Protein: 7.5 g/dL (ref 6.5–8.1)

## 2023-08-21 LAB — CBC WITH DIFFERENTIAL/PLATELET
Abs Immature Granulocytes: 0.05 10*3/uL (ref 0.00–0.07)
Basophils Absolute: 0 10*3/uL (ref 0.0–0.1)
Basophils Relative: 0 %
Eosinophils Absolute: 0 10*3/uL (ref 0.0–0.5)
Eosinophils Relative: 0 %
HCT: 35.9 % — ABNORMAL LOW (ref 36.0–46.0)
Hemoglobin: 11 g/dL — ABNORMAL LOW (ref 12.0–15.0)
Immature Granulocytes: 0 %
Lymphocytes Relative: 9 %
Lymphs Abs: 1.1 10*3/uL (ref 0.7–4.0)
MCH: 29.6 pg (ref 26.0–34.0)
MCHC: 30.6 g/dL (ref 30.0–36.0)
MCV: 96.5 fL (ref 80.0–100.0)
Monocytes Absolute: 1 10*3/uL (ref 0.1–1.0)
Monocytes Relative: 9 %
Neutro Abs: 9.9 10*3/uL — ABNORMAL HIGH (ref 1.7–7.7)
Neutrophils Relative %: 82 %
Platelets: 174 10*3/uL (ref 150–400)
RBC: 3.72 MIL/uL — ABNORMAL LOW (ref 3.87–5.11)
RDW: 15.6 % — ABNORMAL HIGH (ref 11.5–15.5)
WBC: 12.2 10*3/uL — ABNORMAL HIGH (ref 4.0–10.5)
nRBC: 0 % (ref 0.0–0.2)

## 2023-08-21 LAB — URINALYSIS, ROUTINE W REFLEX MICROSCOPIC
Bacteria, UA: NONE SEEN
Bilirubin Urine: NEGATIVE
Glucose, UA: 50 mg/dL — AB
Ketones, ur: NEGATIVE mg/dL
Leukocytes,Ua: NEGATIVE
Nitrite: NEGATIVE
Protein, ur: NEGATIVE mg/dL
Specific Gravity, Urine: 1.015 (ref 1.005–1.030)
pH: 5 (ref 5.0–8.0)

## 2023-08-21 LAB — RESP PANEL BY RT-PCR (RSV, FLU A&B, COVID)  RVPGX2
Influenza A by PCR: NEGATIVE
Influenza B by PCR: NEGATIVE
Resp Syncytial Virus by PCR: NEGATIVE
SARS Coronavirus 2 by RT PCR: NEGATIVE

## 2023-08-21 LAB — CBG MONITORING, ED: Glucose-Capillary: 133 mg/dL — ABNORMAL HIGH (ref 70–99)

## 2023-08-21 LAB — TROPONIN I (HIGH SENSITIVITY)
Troponin I (High Sensitivity): 24 ng/L — ABNORMAL HIGH (ref <18)
Troponin I (High Sensitivity): 26 ng/L — ABNORMAL HIGH (ref <18)

## 2023-08-21 LAB — LIPASE, BLOOD: Lipase: 24 U/L (ref 11–51)

## 2023-08-21 LAB — CK: Total CK: 5084 U/L — ABNORMAL HIGH (ref 38–234)

## 2023-08-21 MED ORDER — MELATONIN 3 MG PO TABS: 3.0000 mg | ORAL_TABLET | Freq: Every evening | ORAL | Status: DC | PRN

## 2023-08-21 MED ORDER — LACTATED RINGERS IV SOLN: INTRAVENOUS | Status: DC

## 2023-08-21 MED ORDER — SODIUM CHLORIDE 0.9 % IV BOLUS
1000.0000 mL | Freq: Once | INTRAVENOUS | Status: AC
Start: 2023-08-21 — End: 2023-08-21
  Administered 2023-08-21: 1000 mL via INTRAVENOUS

## 2023-08-21 MED ORDER — ONDANSETRON HCL 4 MG/2ML IJ SOLN: 4.0000 mg | Freq: Four times a day (QID) | INTRAMUSCULAR | Status: DC | PRN

## 2023-08-21 MED ORDER — INSULIN ASPART 100 UNIT/ML IJ SOLN
0.0000 [IU] | Freq: Three times a day (TID) | INTRAMUSCULAR | Status: DC
Start: 2023-08-22 — End: 2023-08-22
  Administered 2023-08-22: 3 [IU] via SUBCUTANEOUS
  Administered 2023-08-22: 2 [IU] via SUBCUTANEOUS
  Filled 2023-08-21: qty 0.09

## 2023-08-21 MED ORDER — LEVOTHYROXINE SODIUM 75 MCG PO TABS
75.0000 ug | ORAL_TABLET | Freq: Every day | ORAL | Status: DC
  Administered 2023-08-22 – 2023-08-29 (×8): 75 ug via ORAL
  Filled 2023-08-21 (×8): qty 1

## 2023-08-21 MED ORDER — ACETAMINOPHEN 325 MG PO TABS
650.0000 mg | ORAL_TABLET | Freq: Four times a day (QID) | ORAL | Status: DC | PRN
  Administered 2023-08-22 – 2023-08-29 (×8): 650 mg via ORAL
  Filled 2023-08-21 (×8): qty 2

## 2023-08-21 MED ORDER — ACETAMINOPHEN 650 MG RE SUPP
650.0000 mg | Freq: Four times a day (QID) | RECTAL | Status: DC | PRN
  Filled 2023-08-21: qty 1

## 2023-08-21 MED ORDER — CARBIDOPA-LEVODOPA 25-100 MG PO TABS
2.0000 | ORAL_TABLET | Freq: Three times a day (TID) | ORAL | Status: DC
  Administered 2023-08-21 – 2023-08-29 (×23): 2 via ORAL
  Filled 2023-08-21 (×23): qty 2

## 2023-08-21 NOTE — ED Provider Notes (Signed)
Patient here today with friend for evaluation of falls over the last 3 days. Per friend she has fallen 3 different times. Given age and co-morbidities recommended further evaluation in the ED for stat labs. Friend to transport via POV.    Tomi Bamberger, PA-C 08/21/23 [phone]

## 2023-08-21 NOTE — ED Triage Notes (Addendum)
Reports new sore above sacral area she obtained after sliding on the floor. Reports recent increase in falls that is new for patient. Patient does have a hx of Parkinsons disease. Accompanied by close friend Suzette Battiest, patient has given verbal permission to discuss medical information with visitor. Friend states patient does appear at her baseline with speech and mentation

## 2023-08-21 NOTE — ED Triage Notes (Signed)
Patient arrives in wheelchair by POV with close family friends stating they check on her regularly and take her to her appointments. Friend states that he found patient on ground in bathroom Saturday morning, on bedroom floor yesterday morning and again this morning. Patient c/o buttocks pain and bilateral knee pain. Patient states she has lost her balance and that has caused falls. Unsure if she hit her head. Denies any blood thinners. Patient does live alone.

## 2023-08-21 NOTE — H&P (Signed)
History and Physical      MARYPATRICIA Patton WGN:[id number] DOB: 1945/12/19 DOA: 08/21/2023; DOS: 08/21/2023  PCP: Judieth Keens, DO  Patient coming from: home   I have personally briefly reviewed patient's old medical records in [facility]  Chief Complaint: Generalized weakness  HPI: Pamela Patton is a 77 y.o. female with medical history significant for Parkinson's disease, type 2 diabetes mellitus, send hypertension, hyperlipidemia, acquired hypothyroidism, anemia of chronic disease associated baseline hemoglobin 9-11, who is admitted to [facility] on 08/21/2023 with generalized weakness after presenting from home to [facility] ED complaining of generalized weakness.   The patient conveys 3 to 4 days of progressive generalized weakness in the absence of any acute focal weakness.  She notes that she lives at home by herself, and, at baseline, is able to perform all of her own ADLs.  Additionally, she uses a walker at home, but only on a as needed basis.  However, in the setting of her progressive generalized weakness over the last few days, she notes that she is experienced 3 ground-level mechanical falls over that timeframe, which she tripped while attempting to ambulate at home.  As a result of her generalized weakness, she conveys that after each of these falls, she was unable to extricate herself from the floor, and remained on the floor for prolonged period of time until her friend, who checks on her daily, was able to find her on the floor at time of his daily check-in.  After the third set of fall and being found on the floor, patient's friend ultimately convince the patient to be evaluated in the emergency department, prompting her presentation to [facility] this evening.  Of note, on daily baby aspirin at home, but otherwise, no additional blood thinners at home.  She conveys that the above falls are purely mechanical in nature, denying any associated chest pain,  shortness of breath, palpitations, diaphoresis, nausea, vomiting, dizziness, presyncope, or syncope.  She conveys that she did not hit her head as a component as well as, denies any associated loss of consciousness.  No resultant headache or neck stiffness.  As a result of one of the 3 above falls, she reports that she landed on her bilateral knees, and notes some mild tenderness associate the anterior aspect of the bilateral knees as result of this.  Otherwise, denies any significant resultant arthralgias and myalgias, including no acute hip discomfort.  However, due to prolonged periods of time on the floor each of the last 3 days, she notes decline in oral intake over that time, including that of water.  She denies any acute focal numbness or paresthesias, while denying any recent acute change in vision, facial droop, vertigo, dysphagia.  She conveys that her mild dysarthria is chronic for her in the setting of her underlying Parkinson's disease.  Denies any recent worsening thereof.  She confirms a history of underlying Parkinson's and reports good compliance with her outpatient Sinemet.  She also has a history of hyperlipidemia and conveys compliance with her outpatient high intensity atorvastatin.  Denies any recent associated subjective fever, chills, rigors, generalized myalgias.  No recent dysuria or gross hematuria, abdominal pain, diarrhea, cough, rash.    ED Course:  Vital signs in the ED were notable for the following: Afebrile; heart rates in the 60s to 70s; systolic pressures in the 120s reported; respiratory rate 16, oxygen saturation 97 to 1% on room air.  Labs were notable for the following: CMP  notable for the following: Sodium 143, bicarbonate 23, anion gap 12, creatinine 1.62.  To most recent prior creatinine did point 1.24 on 11/18/2022, BUN/creatinine showed 29, glucose 199, calcium 9.5, AST 114, otherwise liver enzymes within normal limits.  CPK 5084.  High sensitive troponin I  initially 24, therapy value trending up slightly to 26, relative to no prior available high sensitive troponin 19 points.  CBC notable for levels of count 12,200, hemoglobin 11 associated with cessation of her properties and relative denies any prior hemoglobin data point of 9.2 on February 2024, platelet count 174.  Urinalysis showed no blood cells, leukocyte esterase/nitrate negative, will demonstrating small hemoglobin in the absence of any RBCs, will also demonstrated presence of hyaline cast.  COVID, influenza and RSV PCR all negative.  Per my interpretation, EKG in ED demonstrated the following: Sinus bradycardia with sinus arrhythmia, rate 57, normal intervals, no evidence of T wave or ST changes, including no evidence of ST elevation.  Imaging in the ED, per corresponding formal radiology read, was notable for the following: 2 view chest x-ray showed no evidence of acute cardiopulmonary process, Cleen evidence of Gurinder Toral, edema, effusion, or pneumothorax.  Plan films of the pelvis showed evidence of acute fracture.  Plan films of the right knee showed noes acute fracture We will demonstrating evidence of a small joint effusion.  Plan comes of the left knee showed no evidence of acute fracture.  Noncontrast CTh and evidence of acute intracranial process, Cleen evidence of intracranial chance of acute infarct.  CT cervical spine showed no evidence of acute cervical spine fracture or subluxation injury.  While in the ED, the following were administered: Normal saline x 1 L bolus.  Subsequently, the patient was admitted for further evaluation and management of presenting generalized weakness resulting in multiple recent falls, with presentation suggestive of early rhabdomyolysis, as well as additional labs notable for acute kidney injury, mildly elevated troponin and leukocytosis.    Review of Systems: As per HPI otherwise 10 point review of systems negative.   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   Carotid artery disease 03/21/2017   Cataract    surgery to remove   Chest pain summer 2012   Chicken pox    CKD (chronic kidney disease) 10/08/2012   Stage 3, GFR 30-59 ml/min   Diverticulosis    difficulty with colonoscopy 2005   DM (diabetes mellitus), type 2 with renal complications 10/08/2012   Fracture    radial/ulnar 03/2016   GERD (gastroesophageal reflux disease)    Heart murmur    innocent murmur   High cholesterol    Hypertension associated with diabetes 02/22/2017   Hyperuricemia 10/31/2016   Hypothyroidism    s/p radioactive iodine tx for graves in 2001   Intestinal metaplasia of gastric mucosa    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   Neuromuscular disorder (HCC) 08/08/2016   Parkinson's   UTI (urinary tract infection)    Vitamin D deficiency 08/24/2012   Weight loss    eval with GI in 2016; s/p CT/MRI and PET scans    Past Surgical History:  Procedure Laterality Date   ABDOMINAL HYSTERECTOMY  1989   CATARACT EXTRACTION, BILATERAL     COLONOSCOPY  07/28/2016   x several, last one in 2017   DG BARIUM SWALLOW ([facility] HX)     9 yrs ago per pt due to incomplete colonsocopy   RADIOLOGY WITH  ANESTHESIA Left 11/18/2022   Procedure: Left Renal Cryoablation;  Surgeon: Simonne Come, MD;  Location: [facility] OR;  Service: Radiology;  Laterality: Left;   UPPER GI ENDOSCOPY  07/28/2016    Social History:  reports that she has never smoked. She has never used smokeless tobacco. She reports that she does not drink alcohol and does not use drugs.   No Known Allergies  Family History  Problem Relation Age of Onset   Cancer Mother        liver   Liver cancer Mother    Stroke Father 28   Diabetes Father    Hypertension Father    Diabetes Maternal Grandmother    Prostate cancer Maternal Grandfather    Pancreatic cancer Brother    Diabetes Sister    Cancer Sister    Diabetes Sister    Colon polyps  Neg Hx    Colon cancer Neg Hx    Esophageal cancer Neg Hx    Rectal cancer Neg Hx    Stomach cancer Neg Hx     Family history reviewed and not pertinent    Prior to Admission medications   Medication Sig Start Date End Date Taking? Authorizing Provider  aspirin 81 MG tablet Take 1 tablet (81 mg total) by mouth at bedtime. Ok to restart 11/20/22 11/19/22   [name], NP  atorvastatin (LIPITOR) 40 MG tablet Take 40 mg by mouth at bedtime.    [provider]  Blood Pressure Monitoring (BLOOD PRESSURE CUFF) MISC 1 Product by Does not apply route as needed. 05/21/19   Terressa Koyanagi, DO  Calcium Carbonate-Vitamin D 600-400 MG-UNIT tablet Take 1 tablet by mouth 3 (three) times a week.     [provider]  carbidopa-levodopa (SINEMET IR) 25-100 MG tablet TAKE TWO TABLETS BY MOUTH EVERY MORNING and TAKE TWO TABLETS BY MOUTH AT NOON and TAKE TWO TABLETS BY MOUTH EVERYDAY AT BEDTIME 05/08/23   [name], [name], DO  Cholecalciferol (VITAMIN D PO) Take 5,000 Units by mouth 3 (three) times a week.     [provider]  Cyanocobalamin (VITAMIN B12 PO) Take 5,000 mcg by mouth 2 (two) times a week.    [provider]  Glucagon (GVOKE HYPOPEN 2-PACK) 1 MG/0.2ML SOAJ Inject into the skin as needed. 01/11/23   [provider]  glucose 4 GM chewable tablet Chew 1 tablet by mouth as needed for low blood sugar.    [provider]  glucose blood (ACCU-CHEK AVIVA PLUS) test strip Use to test blood sugar 4 times a day (Dx: E11.65) 12/25/19   [provider]  hydrochlorothiazide (HYDRODIURIL) 25 MG tablet Take 12.5 mg by mouth daily. 06/29/12   [provider]  insulin glargine (LANTUS) 100 UNIT/ML injection Inject 0.05 mLs (5 Units total) into the skin every evening. Patient taking differently: Inject 5 Units into the skin at bedtime. 11/01/21   [name], MD  Insulin Syringe-Needle U-100 (INSULIN SYRINGE .3CC/31GX5/16") 31G X 5/16" 0.3 ML  MISC Use as directed for Lantus injection once a day 12/25/19   [provider]  levothyroxine (SYNTHROID, LEVOTHROID) 75 MCG tablet Take 75 mcg by mouth daily before breakfast.    [provider]  losartan (COZAAR) 50 MG tablet Take 0.5 tablets (25 mg total) by mouth every evening. 07/13/23   [name], MD  NONFORMULARY OR COMPOUNDED ITEM [facility]:  Antifungal - Terbinafine 3%, Fluconazole 2%, Tea Tree Oil 5%, Urea 10%, Ibuprofen 2% in DMSO Suspension #  30ml. Apply to the affected nail(s) once (at bedtime) or twice daily. Patient taking differently: Apply 1 application  topically as needed (Toe nail fungus). [facility]:  Antifungal - Terbinafine 3%, Fluconazole 2%, Tea Tree Oil 5%, Urea 10%, Ibuprofen 2% in DMSO Suspension #68ml. Apply to the affected nail(s) as needed for fungus 10/31/18   [name], [name], [name]  NOVOLOG FLEXPEN 100 UNIT/ML FlexPen Inject 3 Units into the skin 3 (three) times daily with meals. Sliding scale 101-120=1 unit 121-150=2 units 150-200=3 units 201-250= 4 units 251-300=5 units Over 300= units 11/09/22   [provider]  OneTouch Delica Lancets 33G MISC Use to test blood sugar 4 times a day (Dx: E11.65) 12/25/19   [provider]  Pioglitazone HCl (ACTOS PO) Take 45 mg by mouth at bedtime.    [provider]  Polyethyl Glycol-Propyl Glycol (SYSTANE) 0.4-0.3 % GEL ophthalmic gel Place 1 application into both eyes at bedtime as needed (FOR EYES).    [provider]     Objective    Physical Exam: Vitals:   08/21/23 1539 08/21/23 1543 08/21/23 1936  BP:  129/67 (!) 142/60  Pulse:  70 61  Resp:  16 16  Temp:  97.6 F (36.4 C) 99 F (37.2 C)  TempSrc:  Oral Oral  SpO2:  100% 97%  Weight: 56.7 kg    Height: 5\' 6"  (1.676 m)      General: appears to be stated age; alert, oriented Skin: warm, dry, small sacral wound noted, without significant surrounding erythema, without any  associated drainage, including no purulent drainage  Head:  AT/[place] Mouth:  Oral mucosa membranes appear dry, normal dentition Neck: supple; trachea midline Heart:  RRR; did not appreciate any M/R/G Lungs: CTAB, did not appreciate any wheezes, rales, or rhonchi Abdomen: + BS; soft, ND, NT Vascular: 2+ pedal pulses b/l; 2+ radial pulses b/l Extremities: Mild contusions to the inner aspect of the bilateral knees, no peripheral edema, no muscle wasting Neuro: strength and sensation intact in upper and lower extremities b/l    Labs on Admission: I have personally reviewed following labs and imaging studies  CBC: Recent Labs  Lab 08/21/23 1648  WBC 12.2*  NEUTROABS 9.9*  HGB 11.0*  HCT 35.9*  MCV 96.5  PLT 174   Basic Metabolic Panel: Recent Labs  Lab 08/21/23 1648  NA 143  K 4.2  CL 108  CO2 23  GLUCOSE 199*  BUN 47*  CREATININE 1.62*  CALCIUM 9.5   GFR: Estimated Creatinine Clearance: 25.6 mL/min (A) (by C-G formula based on SCr of 1.62 mg/dL (H)). Liver Function Tests: Recent Labs  Lab 08/21/23 1648  AST 114*  ALT 8  ALKPHOS 50  BILITOT 1.1  PROT 7.5  ALBUMIN 4.1   Recent Labs  Lab 08/21/23 1648  LIPASE 24   No results for input(s): "AMMONIA" in the last 168 hours. Coagulation Profile: No results for input(s): "INR", "PROTIME" in the last 168 hours. Cardiac Enzymes: Recent Labs  Lab 08/21/23 1649  CKTOTAL 5,084*   BNP (last 3 results) No results for input(s): "PROBNP" in the last 8760 hours. HbA1C: No results for input(s): "HGBA1C" in the last 72 hours. CBG: No results for input(s): "GLUCAP" in the last 168 hours. Lipid Profile: No results for input(s): "CHOL", "HDL", "LDLCALC", "TRIG", "CHOLHDL", "LDLDIRECT" in the last 72 hours. Thyroid Function Tests: No results for input(s): "TSH", "T4TOTAL", "FREET4", "T3FREE", "THYROIDAB" in the last 72 hours. Anemia Panel: No results for input(s): "VITAMINB12", "FOLATE", "FERRITIN", "  TIBC", "IRON",  "RETICCTPCT" in the last 72 hours. Urine analysis:    Component Value Date/Time   COLORURINE YELLOW 08/21/2023 2000   APPEARANCEUR CLEAR 08/21/2023 2000   LABSPEC 1.015 08/21/2023 2000   PHURINE 5.0 08/21/2023 2000   GLUCOSEU 50 (A) 08/21/2023 2000   GLUCOSEU NEGATIVE 07/17/2017 1431   HGBUR SMALL (A) 08/21/2023 2000   BILIRUBINUR NEGATIVE 08/21/2023 2000   BILIRUBINUR negative 07/17/2017 1208   KETONESUR NEGATIVE 08/21/2023 2000   PROTEINUR NEGATIVE 08/21/2023 2000   UROBILINOGEN 0.2 07/17/2017 1431   UROBILINOGEN 0.2 07/17/2017 1208   NITRITE NEGATIVE 08/21/2023 2000   LEUKOCYTESUR NEGATIVE 08/21/2023 2000    Radiological Exams on Admission: DG Pelvis 1-2 Views  Result Date: 08/21/2023 CLINICAL DATA:  Status post fall. EXAM: PELVIS - 1-2 VIEW COMPARISON:  None Available. FINDINGS: There is no evidence of pelvic fracture or diastasis. No pelvic bone lesions are seen. Mild to moderate severity degenerative changes seen involving both hips, in the form of joint space narrowing and acetabular sclerosis. IMPRESSION: Degenerative changes involving both hips. Electronically Signed   By: Aram Candela M.D.   On: 08/21/2023 20:45   DG Knee Complete 4 Views Right  Result Date: 08/21/2023 CLINICAL DATA:  Status post fall. EXAM: RIGHT KNEE - COMPLETE 4+ VIEW COMPARISON:  None Available. FINDINGS: No evidence of an acute fracture or dislocation. Medial and lateral marginal osteophytes are noted. There is moderate to marked severity tricompartmental joint space narrowing. A small joint effusion is noted. IMPRESSION: 1. 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 2. Small joint effusion. Electronically Signed   By: Aram Candela M.D.   On: 08/21/2023 20:44   DG Knee Complete 4 Views Left  Result Date: 08/21/2023 CLINICAL DATA:  Status post fall. EXAM: LEFT KNEE - COMPLETE 4+ VIEW COMPARISON:  None Available. FINDINGS: No evidence of fracture, dislocation, or joint  effusion. Medial and lateral marginal osteophytes are noted. There is moderate to marked severity tricompartmental joint space narrowing. Soft tissues are unremarkable. 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 Electronically Signed   By: Aram Candela M.D.   On: 08/21/2023 20:43   CT HEAD WO CONTRAST ( )  Result Date: 08/21/2023 CLINICAL DATA:  Multiple falls over the past several days EXAM: CT HEAD WITHOUT CONTRAST CT CERVICAL SPINE WITHOUT CONTRAST TECHNIQUE: Multidetector CT imaging of the head and cervical spine was performed following the standard protocol without intravenous contrast. Multiplanar CT image reconstructions of the cervical spine were also generated. RADIATION DOSE REDUCTION: This exam was performed according to the departmental dose-optimization program which includes automated exposure control, adjustment of the mA and/or kV according to patient size and/or use of iterative reconstruction technique. COMPARISON:  01/18/2023 CT head and cervical spine FINDINGS: CT HEAD FINDINGS Brain: No evidence of acute infarct, hemorrhage, mass, mass effect, or midline shift. No hydrocephalus or extra-axial fluid collection. Periventricular white matter changes, likely the sequela of chronic small vessel ischemic disease. Vascular: No hyperdense vessel. Skull: Negative for fracture or focal lesion. Sinuses/Orbits: No acute finding. Status post bilateral lens replacements. Other: The mastoid air cells are well aerated. CT CERVICAL SPINE FINDINGS Alignment: No traumatic listhesis. Skull base and vertebrae: No acute fracture or suspicious osseous lesion. Soft tissues and spinal canal: No prevertebral fluid or swelling. No visible canal hematoma. Disc levels: Degenerative changes in the cervical spine.No high-grade spinal canal stenosis. Upper chest: No focal pulmonary opacity or pleural effusion. IMPRESSION: 1. No acute intracranial process. 2. No acute fracture or  traumatic listhesis in the  cervical spine. Electronically Signed   By: Wiliam Ke M.D.   On: 08/21/2023 20:43   CT Cervical Spine Wo Contrast  Result Date: 08/21/2023 CLINICAL DATA:  Multiple falls over the past several days EXAM: CT HEAD WITHOUT CONTRAST CT CERVICAL SPINE WITHOUT CONTRAST TECHNIQUE: Multidetector CT imaging of the head and cervical spine was performed following the standard protocol without intravenous contrast. Multiplanar CT image reconstructions of the cervical spine were also generated. RADIATION DOSE REDUCTION: This exam was performed according to the departmental dose-optimization program which includes automated exposure control, adjustment of the mA and/or kV according to patient size and/or use of iterative reconstruction technique. COMPARISON:  01/18/2023 CT head and cervical spine FINDINGS: CT HEAD FINDINGS Brain: No evidence of acute infarct, hemorrhage, mass, mass effect, or midline shift. No hydrocephalus or extra-axial fluid collection. Periventricular white matter changes, likely the sequela of chronic small vessel ischemic disease. Vascular: No hyperdense vessel. Skull: Negative for fracture or focal lesion. Sinuses/Orbits: No acute finding. Status post bilateral lens replacements. Other: The mastoid air cells are well aerated. CT CERVICAL SPINE FINDINGS Alignment: No traumatic listhesis. Skull base and vertebrae: No acute fracture or suspicious osseous lesion. Soft tissues and spinal canal: No prevertebral fluid or swelling. No visible canal hematoma. Disc levels: Degenerative changes in the cervical spine.No high-grade spinal canal stenosis. Upper chest: No focal pulmonary opacity or pleural effusion. IMPRESSION: 1. No acute intracranial process. 2. No acute fracture or traumatic listhesis in the cervical spine. Electronically Signed   By: Wiliam Ke M.D.   On: 08/21/2023 20:43   DG Chest 2 View  Result Date: 08/21/2023 CLINICAL DATA:  Status post fall. EXAM:  CHEST - 2 VIEW COMPARISON:  November 28, 2017 FINDINGS: The heart size and mediastinal contours are within normal limits. There is moderate severity calcification of the aortic arch and tortuosity of the descending thoracic aorta. Both lungs are clear. The visualized skeletal structures are unremarkable. IMPRESSION: No active cardiopulmonary disease. Electronically Signed   By: Aram Candela M.D.   On: 08/21/2023 20:42      Assessment/Plan    Principal Problem:   Generalized weakness Active Problems:   Acquired hypothyroidism   Parkinson's disease (HCC)   Essential hypertension   Hyperlipidemia   Falls   Sacral wound   Rhabdomyolysis   AKI (acute kidney injury) (HCC)   Elevated troponin   Leukocytosis   Dehydration   DM2 (diabetes mellitus, type 2) (HCC)   Anemia of chronic disease     #) Generalized weakness: 3 to 4-day duration of generalized weakness, in the absence of any evidence of acute focal neurologic deficits, including no evidence of acute focal weakness to suggest acute CVA, while noting that CT head showed no evidence of acute process.  While it is not entirely clear what the initial precipitating factor may have been leading to her generalized weakness, suspect secondary contributions to her generalized weakness from dehydration resulting from significant decline in oral intake over the last 3 days due to multiple prolonged episodes of remaining on the floor after associated ground-level mechanical falls, as further detailed below.  Suspect additional contribution from early rhabdomyolysis in the setting of elevated CPK with evidence of myoglobinuria on presenting urinalysis.  No evidence of contributory infection at this time, including urinalysis that showed no evidence of UTI, while chest x-ray showed noes of acute process, including no evidence of infiltrate, while COVID, influenza, RSV PCR were all negative.  Overall, with the patient at baseline lives by herself  and is able to perform all of her own ADLs and independent fashion, it appears that she is requiring additional assistance with ADL completion over the last few days as a consequence of her generalized weakness over that timeframe.  Will further eval for any additional contributions from endocrine/metabolic sources, as detailed below.   Plan: work-up and management of presenting dehydration, rhabdomyolysis, as described below including IV fluids and repeat CPK level in the morning. PT/OT consults ordered for the AM. Fall precautions. CMP/CBC in the AM. Check TSH, serum Mg level. Check B12 level.                   #) Recurrent ground level mechanical fall: it appears that the patient has experienced 3 ground level falls over the last 3 days that appear to be purely mechanical in nature, with patient conveying that they tripped at home as a consequence of underlying generalized weakness over that time.  Without any preceding or resultant loss of consciousness. Not a/w any presyncope or chest pain. Did not hit head as a component of this fall.  On daily baby aspirin at home.  Reports some resultant mild bilateral knee discomfort, with ensuing plain films, including sunrise view, showed no evidence of acute fracture or dislocation.  Otherwise, denies any resultant significant arthralgias or myalgias.  Will pursue further evaluation of factors that may be contributing to her precipitating generalized weakness, as further detailed above.  This is all superimposed on a baseline predisposition to falls as a result of her underlying Parkinson's disease.  Plan: Fall precautions. Repeat CMP and CBC with differential in the morning.  PT/OT consult ordered for the morning.  Further evaluation and management of presenting generalized weakness, as outlined above.  Prn acetaminophen for pain.                #) Sacral wound: Appears to have a new sacral wound as a consequence of multiple  periods of prolonged downtime after multiple ground-level mechanical falls over the last few days, as outlined above.  Does not appear infected at this time.   Plan: I placed wound care consult for assistance with recommendations regarding appropriate wound care relating to this.  Further evaluation management of generalized weakness leading to her recurrent falls with prolonged downtime, as above.  Prn acetaminophen.                  #) Rhabdomyolysis: diagnosis on basis of acute weakness elevated CPK, and UA suggestive of myoglobinuria.  It is noted that the patient also possesses a an acute kidney injury.  Appears traumatic as a result of prolonged downtime on multiple occasions over the last 3 days, as above.  She is also noted to be on high intensity atorvastatin at home.  Will initiate IV fluids, while pursuing close monitoring of ensuing electrolytes, including serum potassium, calcium, and phosphorus levels, as below.  Plan: Aggressive IVF's in the form of LR 125 cc/hr. Close monitoring of ensuing renal function, including repeat CMP in the morning. Monitor strict I&O's. Check phosphorus level.  Hold home statin. Repeat CPK in the morning. Tele.  Check TSH.                   #) Acute Kidney Injury: Presenting creatinine 1.16 compared to most recent prior value of 1.24 on 11/18/2022.  Appears prerenal in nature, consistent with concomitant finding of acute prerenal azotemia, with contribution from dehydration in the setting of significant decline in oral intake  over the last 3 days due to prolonged periods of downtime on the floor.  She is also at increased risk of worsening renal function as a consequence of her presenting early rhabdomyolysis.  There are potential additional pharmacologic contributing factors that include losartan as well as HCTZ.  Urinalysis notable for the presence of hemoglobin in the absence of any RBCs suggestive of myoglobinuria consistent with  early rhabdomyolysis, also demonstrating the presence of hyaline casts, consistent with a clinical picture of dehydration.  Plan: monitor strict I's & O's and daily weights. Attempt to avoid nephrotoxic agents.  Hold home HCTZ, losartan refrain from NSAIDs. Repeat CMP in the morning. Check serum magnesium level. Add-on random urine sodium and random urine creatinine.  Further evaluation management of presenting rhabdomyolysis, including IV fluids with repeat CPK level ordered for the AM.                   #) Dehydration: Clinical suspicion for such, including the appearance of dry oral mucous membranes as well as laboratory findings notable for acute prerenal azotemia and UA demonstrating the presence of hyaline casts.  Appears to be in the setting of   decline in oral intake over the last 3 days, as above.  No e/o associated hypotension.   Plan: Monitor strict I's and O's.  Daily weights.  CMP in the morning. IVF's, as outlined above.               #) Elevated troponin: mildly elevated initial troponin of 24, followed by 26. No prior high sensitivity troponin I value available for point of comparison.  Suspect that this is as consequence of generalized muscle breakdown consistent with her presenting rhabdomyolysis, and further compounded by relative decline in renal clearance in the setting of presenting acute kidney injury.   Less likely to represent a type I process due to acute plaque rupture in the absence of any associated chest pain, EKG shows no evidence of acute ischemic changes, including no evidence of ST elevation.  Chest x-ray showed no evidence of acute cardiopulmonary process, including no evidence of pneumothorax.  Presentation clinically less suggestive of acute pulmonary embolism.   Plan: repeat troponin in the AM. Monitor on telemetry. PRN EKG for development of chest pain. Check serum Mg level and repeat BMP in the morning.  Repeat CBC in the AM.  Additional evaluation and management of presenting rhabdomyolysis, acute kidney injury as suspected driving force behind mildly elevated troponin, as above.  Echocardiogram in the morning.                   #) Leukocytosis: Presenting CBC reflects mildly elevated white cell count of 12,200. Suspect an element of hemoconcentration in the setting of presenting dehydration. This may also be reactive in nature in the setting of recurrent ground-level mechanical falls over the last 3 days. No evidence to suggest underlying infectious process at this time, including UA that was inconsistent with UTI, chest x-ray showed no evidence of acute process, and negative COVID, influenza, RSV PCR results.  No additional SIRS criteria met at this time. Overall, criteria for sepsis are not currently met. Appears hemodynamically stable.  Therefore, will refrain from initiation of antibiotics at this time.  Plan: Repeat CBC with diff in the morning.  Monitor strict I's and O's, daily weights.  IV fluids, as above.                 #) Parkinson's disease: Documented history of such which  patient is on Sinemet at home.  Plan: Continue outpatient Sinemet.                 #) Type 2 Diabetes Mellitus: documented history of such. Home insulin regimen: Lantus 5 units subcu nightly as well as sliding scale NovoLog 3 times daily with meals.  Additionally, she is on Actos at home.  presenting blood sugar: 199. Most recent A1c noted to be 7.8% when checked in July 2024.  Will hold her home basal dose of evening Lantus for now to reduce risk for ensuing development of hypoglycemia during this hospitalization.  Plan: accuchecks QAC and HS with low dose SSI.  Holding Lantus for now, as above. hold home oral hypoglycemic agents during this hospitalization.                #) Essential Hypertension: documented h/o such, with outpatient antihypertensive regimen including HCTZ,  losartan.  SBP's in the ED today: 120s to 140s mmHg. in the setting of presenting dehydration, will hold home HCTZ for now.  Additionally, in setting of worsening acute kidney injury and rhabdomyolysis, will hold home losartan for now.  Plan: Close monitoring of subsequent BP via routine VS. hold home HCTZ, losartan for now, as above.                   #) Hyperlipidemia: documented h/o such. On high intensity atorvastatin as outpatient.  In the setting of clinical evidence of early rhabdomyolysis, will hold him Lipitor for now.  Plan: Hold home statin for now, as above.  Repeat CPK level in the morning.                #) acquired hypothyroidism: documented h/o such, on Synthroid as outpatient.   Plan: cont home Synthroid.  In the setting of her presenting generalized weakness, will also check TSH level.                   #) Anemia of chronic disease: Documented history of such, a/w with baseline hgb range 9-11, with presenting hgb consistent with this range, in the absence of any overt evidence of active bleed.     Plan: Repeat CBC in the morning.       DVT prophylaxis: SCD's   Code Status: Full code Family Communication: none Disposition Plan: Per Rounding Team Consults called: none;  Admission status: Inpatient    I SPENT GREATER THAN 75  MINUTES IN CLINICAL CARE TIME/MEDICAL DECISION-MAKING IN COMPLETING THIS ADMISSION.     Chaney Born Jamaurion Slemmer DO [facility] From 7PM - 7AM   08/21/2023, 9:05 PM

## 2023-08-21 NOTE — Discharge Instructions (Signed)
  Please report to ED for further evaluation.   [facility] ED  [address] [place], [place] [address]

## 2023-08-21 NOTE — ED Provider Notes (Signed)
[place] EMERGENCY DEPARTMENT AT [facility] Provider Note   CSN: [id number] Arrival date & time: 08/21/23  1524     History  Chief Complaint  Patient presents with   Fall   Weakness    Pamela Patton is a 77 y.o. female.  Patient here with a family friend.  Patient has had several falls the last several days.  Family friend states that they found her on the floor 3 days in a row.  She has a history of Parkinson's.  Does not really use any assistive devices to walk.  Not on blood thinners.  She is got a pressure wound to her low back from being on the floor.  She cannot really tell me much about her falls she thinks have been mechanical.  Family friend states that she has had a pretty significant decline here the last few days.  She is usually pretty good about ambulating taking care of herself cooking and cleaning but seems like she has not been able to do these functions these last few days.  She does not think that she has had any stroke symptoms.  Her speech is at her baseline she does not notice any increased weakness on one side of the body compared to the other.  No vision loss.  She has been too weak to get up off the floor herself.  She is complaining mostly of bilateral knee pain.  She is not sure if she hit her head.  She also has history of diabetes and CKD.  She denies any infectious symptoms but she did does describe body aches.  The history is provided by the patient and a caregiver.       Home Medications Prior to Admission medications   Medication Sig Start Date End Date Taking? Authorizing Provider  aspirin 81 MG tablet Take 1 tablet (81 mg total) by mouth at bedtime. Ok to restart 11/20/22 11/19/22   [name], NP  atorvastatin (LIPITOR) 40 MG tablet Take 40 mg by mouth at bedtime.    [provider]  Blood Pressure Monitoring (BLOOD PRESSURE CUFF) MISC 1 Product by Does not apply route as needed. 05/21/19   Terressa Koyanagi, DO  Calcium  Carbonate-Vitamin D 600-400 MG-UNIT tablet Take 1 tablet by mouth 3 (three) times a week.     [provider]  carbidopa-levodopa (SINEMET IR) 25-100 MG tablet TAKE TWO TABLETS BY MOUTH EVERY MORNING and TAKE TWO TABLETS BY MOUTH AT NOON and TAKE TWO TABLETS BY MOUTH EVERYDAY AT BEDTIME 05/08/23   [name], [name], DO  Cholecalciferol (VITAMIN D PO) Take 5,000 Units by mouth 3 (three) times a week.     [provider]  Cyanocobalamin (VITAMIN B12 PO) Take 5,000 mcg by mouth 2 (two) times a week.    [provider]  Glucagon (GVOKE HYPOPEN 2-PACK) 1 MG/0.2ML SOAJ Inject into the skin as needed. 01/11/23   [provider]  glucose 4 GM chewable tablet Chew 1 tablet by mouth as needed for low blood sugar.    [provider]  glucose blood (ACCU-CHEK AVIVA PLUS) test strip Use to test blood sugar 4 times a day (Dx: E11.65) 12/25/19   [provider]  hydrochlorothiazide (HYDRODIURIL) 25 MG tablet Take 12.5 mg by mouth daily. 06/29/12   [provider]  insulin glargine (LANTUS) 100 UNIT/ML injection Inject 0.05 mLs (5 Units total) into the skin every evening. Patient taking differently: Inject 5 Units into the skin  at bedtime. 11/01/21   [name], MD  Insulin Syringe-Needle U-100 (INSULIN SYRINGE .3CC/31GX5/16") 31G X 5/16" 0.3 ML MISC Use as directed for Lantus injection once a day 12/25/19   [provider]  levothyroxine (SYNTHROID, LEVOTHROID) 75 MCG tablet Take 75 mcg by mouth daily before breakfast.    [provider]  losartan (COZAAR) 50 MG tablet Take 0.5 tablets (25 mg total) by mouth every evening. 07/13/23   [name], MD  NONFORMULARY OR COMPOUNDED ITEM [facility]:  Antifungal - Terbinafine 3%, Fluconazole 2%, Tea Tree Oil 5%, Urea 10%, Ibuprofen 2% in DMSO Suspension #34ml. Apply to the affected nail(s) once (at bedtime) or twice daily. Patient taking differently: Apply 1 application   topically as needed (Toe nail fungus). [facility]:  Antifungal - Terbinafine 3%, Fluconazole 2%, Tea Tree Oil 5%, Urea 10%, Ibuprofen 2% in DMSO Suspension #59ml. Apply to the affected nail(s) as needed for fungus 10/31/18   [name], [name], [name]  NOVOLOG FLEXPEN 100 UNIT/ML FlexPen Inject 3 Units into the skin 3 (three) times daily with meals. Sliding scale 101-120=1 unit 121-150=2 units 150-200=3 units 201-250= 4 units 251-300=5 units Over 300= units 11/09/22   [provider]  OneTouch Delica Lancets 33G MISC Use to test blood sugar 4 times a day (Dx: E11.65) 12/25/19   [provider]  Pioglitazone HCl (ACTOS PO) Take 45 mg by mouth at bedtime.    [provider]  Polyethyl Glycol-Propyl Glycol (SYSTANE) 0.4-0.3 % GEL ophthalmic gel Place 1 application into both eyes at bedtime as needed (FOR EYES).    [provider]      Allergies    Patient has no known allergies.    Review of Systems   Review of Systems  Neurological:  Positive for weakness.    Physical Exam Updated Vital Signs BP (!) 142/60   Pulse 61   Temp 99 F (37.2 C) (Oral)   Resp 16   Ht 5\' 6"  (1.676 m)   Wt 56.7 kg   SpO2 97%   BMI 20.18 kg/m  Physical Exam Vitals and nursing note reviewed.  Constitutional:      General: She is not in acute distress.    Appearance: She is well-developed.  HENT:     Head: Normocephalic and atraumatic.     Mouth/Throat:     Mouth: Mucous membranes are dry.  Eyes:     Extraocular Movements: Extraocular movements intact.     Conjunctiva/sclera: Conjunctivae normal.     Pupils: Pupils are equal, round, and reactive to light.  Cardiovascular:     Rate and Rhythm: Normal rate and regular rhythm.     Heart sounds: No murmur heard. Pulmonary:     Effort: Pulmonary effort is normal. No respiratory distress.     Breath sounds: Normal breath sounds.  Abdominal:     Palpations: Abdomen is soft.     Tenderness: There is no abdominal  tenderness.  Musculoskeletal:        General: No swelling.     Cervical back: Neck supple.  Skin:    General: Skin is warm and dry.     Capillary Refill: Capillary refill takes less than 2 seconds.     Comments: Stage I pressure wound to the low back  Neurological:     Mental Status: She is alert.     Comments: She appears globally weak in all 4 extremities, sensations intact, she has slightly slowed speech and slurred speech, cranial nerves  appear to be intact, visual fields are intact  Psychiatric:        Mood and Affect: Mood normal.     ED Results / Procedures / Treatments   Labs (all labs ordered are listed, but only abnormal results are displayed) Labs Reviewed  CBC WITH DIFFERENTIAL/PLATELET - Abnormal; Notable for the following components:      Result Value   WBC 12.2 (*)    RBC 3.72 (*)    Hemoglobin 11.0 (*)    HCT 35.9 (*)    RDW 15.6 (*)    Neutro Abs 9.9 (*)    All other components within normal limits  COMPREHENSIVE METABOLIC PANEL - Abnormal; Notable for the following components:   Glucose, Bld 199 (*)    BUN 47 (*)    Creatinine, Ser 1.62 (*)    AST 114 (*)    GFR, Estimated 32 (*)    All other components within normal limits  URINALYSIS, ROUTINE W REFLEX MICROSCOPIC - Abnormal; Notable for the following components:   Glucose, UA 50 (*)    Hgb urine dipstick SMALL (*)    All other components within normal limits  CK - Abnormal; Notable for the following components:   Total CK 5,084 (*)    All other components within normal limits  TROPONIN I (HIGH SENSITIVITY) - Abnormal; Notable for the following components:   Troponin I (High Sensitivity) 24 (*)    All other components within normal limits  TROPONIN I (HIGH SENSITIVITY) - Abnormal; Notable for the following components:   Troponin I (High Sensitivity) 26 (*)    All other components within normal limits  RESP PANEL BY RT-PCR (RSV, FLU A&B, COVID)  RVPGX2  LIPASE, BLOOD  CBG MONITORING, ED     EKG EKG Interpretation Date/Time:  Monday August 21 2023 18:02:57 EST Ventricular Rate:  57 PR Interval:  136 QRS Duration:  82 QT Interval:  388 QTC Calculation: 377 R Axis:   44  Text Interpretation: Sinus bradycardia with sinus arrhythmia Nonspecific ST and T wave abnormality Abnormal ECG When compared with ECG of 21-Aug-2023 18:01, PREVIOUS ECG IS PRESENT Confirmed by Virgina Norfolk [phone]) on 08/21/2023 6:05:37 PM  Radiology No results found.  Procedures Procedures    Medications Ordered in ED Medications  sodium chloride 0.9 % bolus 1,000 mL (has no administration in time range)    ED Course/ Medical Decision Making/ A&P                                 Medical Decision Making Amount and/or Complexity of Data Reviewed Labs: ordered. Radiology: ordered.  Risk Decision regarding hospitalization.   CRISTELA NEALLY is here with fall, failure to thrive.  Normal vitals.  No fever.  History of Parkinson's history of CKD and diabetes.  Patient lives by herself.  Sounds like she has had several falls this last few days.  Family friend/neighbor has gone to her house every day this last few days and then picked her up off the floor and put her in bed.  She has been able to take care of herself like she has been normally.  Family member states there has been a significant decline since midweek this past week.  She denies any specific pain or symptoms.  Overall she is globally weak on exam.  She looks dehydrated.  She does not have any major bony tenderness.  No signs obvious trauma.  Neurologically  she appears intact.  She has slowed and slurred speech but maybe she states that is her baseline.  Overall she does not appear to be safe living by herself and does not appear to be able to take care of herself.  Will do broad workup including blood work, urinalysis, chest x-ray, CT scan of the head and neck.  Not sure if there is an electrolyte abnormality or kidney injury or  dehydration or infectious process or stroke.  Anticipate admission.  Per my review and interpretation the labs creatinine little bit above baseline.  CK is around 5000.  Troponin mildly elevated at 24 and 26.  Urinalysis negative for infection.  Lab work otherwise unremarkable.  Per my review and interpretation of x-rays I do not see any obvious fracture or malalignment in the knees or pelvis.  X-ray does not show any obvious pneumonia.  Head CT per my review and interpretation does not show any obvious head bleed.  Overall, I think she is clinically dehydrated.  Will give her fluid bolus.  CK is elevated but kidney function right now 1.6.  I believe she benefit from hydration and PT and OT and likely placement or home health support.  I do not think she is safe to live by herself.  Will admit to hospitalist for further care.  Images still awaiting radiology read.  Will continue to follow with these.  This chart was dictated using voice recognition software.  Despite best efforts to proofread,  errors can occur which can change the documentation meaning.         Final Clinical Impression(s) / ED Diagnoses Final diagnoses:  Failure to thrive in adult  Dehydration  Fall, initial encounter    Rx / DC Orders ED Discharge Orders     None         Virgina Norfolk, DO 08/21/23 2018

## 2023-08-22 ENCOUNTER — Inpatient Hospital Stay (HOSPITAL_COMMUNITY): Payer: Medicare PPO

## 2023-08-22 DIAGNOSIS — E860 Dehydration: Secondary | ICD-10-CM

## 2023-08-22 DIAGNOSIS — R7989 Other specified abnormal findings of blood chemistry: Secondary | ICD-10-CM

## 2023-08-22 DIAGNOSIS — R531 Weakness: Secondary | ICD-10-CM | POA: Diagnosis not present

## 2023-08-22 LAB — COMPREHENSIVE METABOLIC PANEL
ALT: 21 U/L (ref 0–44)
AST: 73 U/L — ABNORMAL HIGH (ref 15–41)
Albumin: 3.1 g/dL — ABNORMAL LOW (ref 3.5–5.0)
Alkaline Phosphatase: 38 U/L (ref 38–126)
Anion gap: 8 (ref 5–15)
BUN: 39 mg/dL — ABNORMAL HIGH (ref 8–23)
CO2: 24 mmol/L (ref 22–32)
Calcium: 8.6 mg/dL — ABNORMAL LOW (ref 8.9–10.3)
Chloride: 111 mmol/L (ref 98–111)
Creatinine, Ser: 1.02 mg/dL — ABNORMAL HIGH (ref 0.44–1.00)
GFR, Estimated: 56 mL/min — ABNORMAL LOW (ref >60)
Glucose, Bld: 248 mg/dL — ABNORMAL HIGH (ref 70–99)
Potassium: 3.7 mmol/L (ref 3.5–5.1)
Sodium: 143 mmol/L (ref 135–145)
Total Bilirubin: 1.1 mg/dL (ref <1.2)
Total Protein: 5.9 g/dL — ABNORMAL LOW (ref 6.5–8.1)

## 2023-08-22 LAB — CK: Total CK: 2326 U/L — ABNORMAL HIGH (ref 38–234)

## 2023-08-22 LAB — ECHOCARDIOGRAM COMPLETE
AR max vel: 1.95 cm2
AV Area VTI: 1.85 cm2
AV Area mean vel: 1.71 cm2
AV Mean grad: 10 mm[Hg]
AV Peak grad: 19.5 mm[Hg]
Ao pk vel: 2.21 m/s
Area-P 1/2: 2.48 cm2
Height: 66 in
S' Lateral: 2.6 cm
Weight: 2081.14 [oz_av]

## 2023-08-22 LAB — CBC WITH DIFFERENTIAL/PLATELET
Abs Immature Granulocytes: 0.04 10*3/uL (ref 0.00–0.07)
Basophils Absolute: 0 10*3/uL (ref 0.0–0.1)
Basophils Relative: 0 %
Eosinophils Absolute: 0 10*3/uL (ref 0.0–0.5)
Eosinophils Relative: 0 %
HCT: 27.1 % — ABNORMAL LOW (ref 36.0–46.0)
Hemoglobin: 8.4 g/dL — ABNORMAL LOW (ref 12.0–15.0)
Immature Granulocytes: 0 %
Lymphocytes Relative: 11 %
Lymphs Abs: 1 10*3/uL (ref 0.7–4.0)
MCH: 29.6 pg (ref 26.0–34.0)
MCHC: 31 g/dL (ref 30.0–36.0)
MCV: 95.4 fL (ref 80.0–100.0)
Monocytes Absolute: 0.8 10*3/uL (ref 0.1–1.0)
Monocytes Relative: 8 %
Neutro Abs: 7.3 10*3/uL (ref 1.7–7.7)
Neutrophils Relative %: 81 %
Platelets: 168 10*3/uL (ref 150–400)
RBC: 2.84 MIL/uL — ABNORMAL LOW (ref 3.87–5.11)
RDW: 15.5 % (ref 11.5–15.5)
WBC: 9.2 10*3/uL (ref 4.0–10.5)
nRBC: 0 % (ref 0.0–0.2)

## 2023-08-22 LAB — GLUCOSE, CAPILLARY
Glucose-Capillary: 200 mg/dL — ABNORMAL HIGH (ref 70–99)
Glucose-Capillary: 103 mg/dL — ABNORMAL HIGH (ref 70–99)
Glucose-Capillary: 210 mg/dL — ABNORMAL HIGH (ref 70–99)
Glucose-Capillary: 156 mg/dL — ABNORMAL HIGH (ref 70–99)

## 2023-08-22 LAB — PHOSPHORUS: Phosphorus: 2.5 mg/dL (ref 2.5–4.6)

## 2023-08-22 LAB — VITAMIN B12: Vitamin B-12: 531 pg/mL (ref 180–914)

## 2023-08-22 LAB — SODIUM, URINE, RANDOM: Sodium, Ur: 56 mmol/L

## 2023-08-22 LAB — MAGNESIUM: Magnesium: 1.9 mg/dL (ref 1.7–2.4)

## 2023-08-22 LAB — CREATININE, URINE, RANDOM: Creatinine, Urine: 164 mg/dL

## 2023-08-22 LAB — TROPONIN I (HIGH SENSITIVITY): Troponin I (High Sensitivity): 20 ng/L — ABNORMAL HIGH (ref <18)

## 2023-08-22 LAB — TSH: TSH: 3.155 u[IU]/mL (ref 0.350–4.500)

## 2023-08-22 MED ORDER — ASPIRIN 81 MG PO TBEC
81.0000 mg | DELAYED_RELEASE_TABLET | Freq: Every day | ORAL | Status: DC
  Administered 2023-08-22 – 2023-08-28 (×7): 81 mg via ORAL
  Filled 2023-08-22 (×7): qty 1

## 2023-08-22 MED ORDER — MEDIHONEY WOUND/BURN DRESSING EX PSTE
1.0000 | PASTE | Freq: Every day | CUTANEOUS | Status: DC
  Administered 2023-08-22 – 2023-08-29 (×8): 1 via TOPICAL
  Filled 2023-08-22: qty 44

## 2023-08-22 MED ORDER — INSULIN GLARGINE-YFGN 100 UNIT/ML ~~LOC~~ SOLN
5.0000 [IU] | Freq: Every day | SUBCUTANEOUS | Status: DC
  Administered 2023-08-22 – 2023-08-28 (×7): 5 [IU] via SUBCUTANEOUS
  Filled 2023-08-22 (×8): qty 0.05

## 2023-08-22 MED ORDER — ASPIRIN 81 MG PO TABS: 81.0000 mg | ORAL_TABLET | Freq: Every day | ORAL | Status: DC

## 2023-08-22 MED ORDER — ATORVASTATIN CALCIUM 40 MG PO TABS
40.0000 mg | ORAL_TABLET | Freq: Every day | ORAL | Status: DC
  Administered 2023-08-22 – 2023-08-23 (×2): 40 mg via ORAL
  Filled 2023-08-22 (×2): qty 1

## 2023-08-22 MED ORDER — INSULIN ASPART 100 UNIT/ML IJ SOLN
0.0000 [IU] | Freq: Three times a day (TID) | INTRAMUSCULAR | Status: DC
  Administered 2023-08-23: 3 [IU] via SUBCUTANEOUS
  Administered 2023-08-24: 5 [IU] via SUBCUTANEOUS
  Administered 2023-08-24: 3 [IU] via SUBCUTANEOUS
  Administered 2023-08-25 (×2): 2 [IU] via SUBCUTANEOUS
  Administered 2023-08-27: 3 [IU] via SUBCUTANEOUS
  Administered 2023-08-28 – 2023-08-29 (×2): 2 [IU] via SUBCUTANEOUS

## 2023-08-22 MED ORDER — INSULIN ASPART 100 UNIT/ML IJ SOLN
0.0000 [IU] | Freq: Every day | INTRAMUSCULAR | Status: DC
  Administered 2023-08-28: 2 [IU] via SUBCUTANEOUS

## 2023-08-22 NOTE — Plan of Care (Signed)
  Problem: Education: Goal: Knowledge of General Education information will improve Description: Including pain rating scale, medication(s)/side effects and non-pharmacologic comfort measures Outcome: Progressing   Problem: Clinical Measurements: Goal: Diagnostic test results will improve Outcome: Progressing   Problem: Activity: Goal: Risk for activity intolerance will decrease Outcome: Progressing   Problem: Coping: Goal: Level of anxiety will decrease Outcome: Progressing   Problem: Pain Management: Goal: General experience of comfort will improve Outcome: Progressing

## 2023-08-22 NOTE — Plan of Care (Signed)

## 2023-08-22 NOTE — Progress Notes (Signed)
  Echocardiogram 2D Echocardiogram has been performed.  Pamela Patton 08/22/2023, 9:44 AM

## 2023-08-22 NOTE — Progress Notes (Signed)
PT Cancellation Note  Patient Details Name: Pamela Patton MRN: [id number] DOB: 1945-12-23   Cancelled Treatment:    Reason Eval/Treat Not Completed: Fatigue/lethargy limiting ability to participate, currently with nursing and has been up and down several times. Will check back in PM [facility] PT Acute Rehabilitation Services Office [phone] Weekend [phone]    Rada Hay 08/22/2023, 11:35 AM

## 2023-08-22 NOTE — Assessment & Plan Note (Signed)
Cranial nerves: There is good facial symmetry with mild facial hypomimia. There is upgaze paresis.  The speech is fluent and clear but pseudobulbar, same as previous. Soft palate rises symmetrically and there is no tongue deviation. Hearing is intact to conversational tone. Sensation: Sensation is intact to light touch throughout Motor: Strength is at least antigravity x4.

## 2023-08-22 NOTE — Evaluation (Signed)
Occupational Therapy Evaluation Patient Details Name: Pamela Patton MRN: [id number] DOB: 1946-09-24 Today's Date: 08/22/2023   History of Present Illness Pt is a 77 y.o. female admitted with generalized weakness and three recent falls. PMH sig for Parkinson's disease, Type II DM, HTN, hyperlipidemia, acquired hypothyroidism, anemia of chronic disease (associated baseline hemoglobin 9-11)   Clinical Impression    Pt admitted with above diagnosis. Prior to hospital admission, pt was independent and living alone (overall poor historian). Pt currently requires setup-minA for UB ADLs, min-modA for LB ADLs and transfers with minA + hand-held assist. Pt would benefit from skilled OT services to address noted impairments and functional limitations (see below for any additional details) in order to maximize safety and independence while minimizing falls risk and caregiver burden. Patient will benefit from continued inpatient follow up therapy, <3 hours/day.        If plan is discharge home, recommend the following: Assist for transportation;Supervision due to cognitive status;A little help with walking and/or transfers;A little help with bathing/dressing/bathroom;Assistance with cooking/housework;Direct supervision/assist for medications management;Direct supervision/assist for financial management    Functional Status Assessment  Patient has had a recent decline in their functional status and demonstrates the ability to make significant improvements in function in a reasonable and predictable amount of time.  Equipment Recommendations  None recommended by OT (defer to next LOC)    Recommendations for Other Services       Precautions / Restrictions Precautions Precautions: Fall Restrictions Weight Bearing Restrictions: No      Mobility Bed Mobility Overal bed mobility: Needs Assistance Bed Mobility: Sit to Supine       Sit to supine: Mod assist, Used rails   General bed mobility  comments: requires assist for BLE and cuing for sequencing to return to supine    Transfers Overall transfer level: Needs assistance Equipment used: 2 person hand held assist Transfers: Bed to chair/wheelchair/BSC, Sit to/from Stand Sit to Stand: Min assist     Step pivot transfers: Min assist     General transfer comment: requires cues for sequencing and to power up to standing      Balance Overall balance assessment: Mild deficits observed, not formally tested                                         ADL either performed or assessed with clinical judgement   ADL Overall ADL's : Needs assistance/impaired Eating/Feeding: Set up;Sitting   Grooming: Set up;Sitting   Upper Body Bathing: Minimal assistance;Sitting   Lower Body Bathing: Sit to/from stand;Moderate assistance   Upper Body Dressing : Minimal assistance;Sitting   Lower Body Dressing: Minimal assistance;Sit to/from stand;Sitting/lateral leans   Toilet Transfer: Minimal assistance;Cueing for sequencing;Cueing for safety;Stand-pivot Toilet Transfer Details (indicate cue type and reason): 2 person HHA from recliner > EOB Toileting- Clothing Manipulation and Hygiene: Minimal assistance;Sit to/from stand;Sitting/lateral lean       Functional mobility during ADLs: Minimal assistance General ADL Comments: Pt requires cues for safety and sequencing with slow movements likely due to hx of Parkinsons. Setup-minA for UB ADLs, min-modA for LB ADLs and transfers with minA.     Vision Patient Visual Report: No change from baseline Vision Assessment?: Vision impaired- to be further tested in functional context Additional Comments: Pt denies visual changes - but appears to have decreased tracking and often stares off into space, requires prompts to answer question  Pertinent Vitals/Pain Pain Assessment Pain Assessment: No/denies pain     Extremity/Trunk Assessment Upper Extremity  Assessment Upper Extremity Assessment: Generalized weakness   Lower Extremity Assessment Lower Extremity Assessment: Generalized weakness       Communication Communication Communication: Other (comment) (dysarthria at times (hx of Parkinsons))   Cognition Arousal: Alert Behavior During Therapy: WFL for tasks assessed/performed Overall Cognitive Status: No family/caregiver present to determine baseline cognitive functioning                                 General Comments: pt required cues to use phone in room and to operate remote control                Home Living Family/patient expects to be discharged to:: Private residence Living Arrangements: Alone Available Help at Discharge: Friend(s);Family;Available PRN/intermittently Type of Home: Apartment Home Access: Level entry     Home Layout: One level     Bathroom Shower/Tub: Chief Strategy Officer: Standard     Home Equipment: None   Additional Comments: pt reporting that she is likely unable to return home alone with CLOF and perhaps needs to live "somewhere else"      Prior Functioning/Environment Prior Level of Function : Independent/Modified Independent;History of Falls (last six months);Patient poor historian/Family not available             Mobility Comments: reports independent; says three falls over the past year, reports she can get up from the floor herself (info provided by pt differs from chart review) ADLs Comments: independent        OT Problem List: Decreased strength;Decreased activity tolerance;Impaired balance (sitting and/or standing);Decreased knowledge of use of DME or AE;Decreased coordination;Decreased cognition      OT Treatment/Interventions: Self-care/ADL training;Therapeutic exercise;Neuromuscular education;DME and/or AE instruction;Therapeutic activities;Patient/family education;Balance training    OT Goals(Current goals can be found in the care plan  section) Acute Rehab OT Goals OT Goal Formulation: With patient Time For Goal Achievement: 09/05/23 Potential to Achieve Goals: Fair  OT Frequency: Min 1X/week       AM-PAC OT "6 Clicks" Daily Activity     Outcome Measure Help from another person eating meals?: None Help from another person taking care of personal grooming?: A Little Help from another person toileting, which includes using toliet, bedpan, or urinal?: A Lot Help from another person bathing (including washing, rinsing, drying)?: A Lot Help from another person to put on and taking off regular upper body clothing?: A Little Help from another person to put on and taking off regular lower body clothing?: A Lot 6 Click Score: 16   End of Session Equipment Utilized During Treatment: Gait belt Nurse Communication: Mobility status  Activity Tolerance: Patient tolerated treatment well Patient left: in bed;with call bell/phone within reach;with bed alarm set;with nursing/sitter in room;Other (comment) (staff in room for ECHO)  OT Visit Diagnosis: Repeated falls (R29.6);Muscle weakness (generalized) (M62.81);Unsteadiness on feet (R26.81)                Time: 1610-9604 OT Time Calculation (min): 15 min Charges:  OT General Charges $OT Visit: 1 Visit OT Evaluation $OT Eval Low Complexity: 1 Low Briunna Leicht L. Leightyn Cina, OTR/L  08/22/23, 10:40 AM

## 2023-08-22 NOTE — Consult Note (Addendum)
WOC Nurse Consult Note: patient states she sustained a skin tear when transferring over to a table for CT scan and has had a wound ever since; noted in ED note that she has had multiple falls and this wound may be from a fall  Reason for Consult: sacral wound  Wound type: full thickness r/t trauma  Pressure Injury POA: NA, does not appear to be a pressure injury  Measurement: 6 cm x 7 cm x 0.1 cm with a 3 cm x 3 cm area of tan devitalized tissue  Wound bed: as above  Drainage (amount, consistency, odor) minimal tan exudate  Periwound: intact  Dressing procedure/placement/frequency: Clean sacral wound with NS, apply Medihoney to wound bed daily, cover with dry gauze and silicone foam. May lift foam daily to replace Medihoney. Change foam q3 days and prn soiling.    POC discussed with patient and bedside nurse. WOC team will not follow. Re-consult if further needs arise.   Thank you,    Priscella Mann MSN, RN-BC, [employer] [phone]

## 2023-08-22 NOTE — Evaluation (Signed)
Physical Therapy Evaluation Patient Details Name: Pamela Patton MRN: [id number] DOB: [birth year] Today's Date: 08/22/2023  History of Present Illness  Pt is a 77 y.o. female admitted with generalized weakness and three recent falls. PMH sig for Parkinson's disease, Type II DM, HTN, hyperlipidemia, acquired hypothyroidism, anemia of chronic disease (associated baseline hemoglobin 9-11)  Clinical Impression  Pt admitted with above diagnosis.  Pt currently with functional limitations due to the deficits listed below (see PT Problem List). Pt will benefit from acute skilled PT to increase their independence and safety with mobility to allow discharge.     The patient  presents with significant  dyskinesia and festinating gait.  Patient reports being independent and used no device for ambulation at baseline and lives alone.  Patient has stiffness in legs  in extremities and trunk, flat affect. Patient did ambulate with mod support and Rw x 20', requiring facilitation to progress in a  forward direction.  Patient will benefit from intensive inpatient follow up therapy, >3 hours/day       If plan is discharge home, recommend the following: A little help with walking and/or transfers;A little help with bathing/dressing/bathroom;Assistance with cooking/housework;Assist for transportation;Help with stairs or ramp for entrance   Can travel by private vehicle        Equipment Recommendations Rolling walker (2 wheels)  Recommendations for Other Services  Rehab consult    Functional Status Assessment Patient has had a recent decline in their functional status and demonstrates the ability to make significant improvements in function in a reasonable and predictable amount of time.     Precautions / Restrictions Precautions Precautions: Fall Restrictions Weight Bearing Restrictions: No      Mobility  Bed Mobility   Bed Mobility: Sit to Supine       Sit to supine: Mod assist, Used rails    General bed mobility comments: assist with  the legs and trunk, patient required support to scoot to bed edge    Transfers Overall transfer level: Needs assistance Equipment used: Rolling walker (2 wheels) Transfers: Bed to chair/wheelchair/BSC, Sit to/from Stand Sit to Stand: Min assist   Step pivot transfers: Min assist       General transfer comment: requires cues for sequencing and to power up to standing,    Ambulation/Gait Ambulation/Gait assistance: Mod assist, +2 safety/equipment Gait Distance (Feet): 20 Feet Assistive device: Rolling walker (2 wheels) Gait Pattern/deviations: Step-to pattern, Festinating Gait velocity: decr     General Gait Details: assistance to  roll RW forward, assist to WS to facilitate forward progression  Stairs            Wheelchair Mobility     Tilt Bed    Modified Rankin (Stroke Patients Only)       Balance Overall balance assessment: Needs assistance Sitting-balance support: Feet supported, Bilateral upper extremity supported Sitting balance-Leahy Scale: Fair     Standing balance support: Bilateral upper extremity supported, Reliant on assistive device for balance, During functional activity Standing balance-Leahy Scale: Poor Standing balance comment: posterior bias                             Pertinent Vitals/Pain Pain Assessment Pain Assessment: Faces Faces Pain Scale: Hurts even more Pain Location: R knee Pain Descriptors / Indicators: Discomfort Pain Intervention(s): Monitored during session, Patient requesting pain meds-RN notified    Home Living Family/patient expects to be discharged to:: Private residence Living Arrangements: Alone Available Help at  Discharge: Friend(s);Family;Available PRN/intermittently Type of Home: Apartment Home Access: Level entry       Home Layout: One level Home Equipment: None Additional Comments: pt reporting that she is likely unable to return home alone with  CLOF and perhaps needs to live "somewhere else"    Prior Function Prior Level of Function : Independent/Modified Independent;History of Falls (last six months);Patient poor historian/Family not available             Mobility Comments: reports independent; says three falls over the past year, reports she can get up from the floor herself (info provided by pt differs from chart review) ADLs Comments: independent     Extremity/Trunk Assessment   Upper Extremity Assessment Upper Extremity Assessment: Generalized weakness    Lower Extremity Assessment Lower Extremity Assessment: LLE deficits/detail;RLE deficits/detail RLE Deficits / Details: stiffness to flex legs to attempt to roll LLE Deficits / Details: similar to right    Cervical / Trunk Assessment Cervical / Trunk Assessment: Other exceptions Cervical / Trunk Exceptions: decreased head rotation, tends to  hold trunk rigid  Communication   Communication Communication: Difficulty communicating thoughts/reduced clarity of speech (dysarthria)  Cognition Arousal: Alert Behavior During Therapy: Flat affect, WFL for tasks assessed/performed Overall Cognitive Status: No family/caregiver present to determine baseline cognitive functioning                                 General Comments: able to provide  recent  history about decline, Oriented to place and mo, yr  and daY        General Comments      Exercises     Assessment/Plan    PT Assessment Patient needs continued PT services  PT Problem List Decreased strength;Decreased balance;Decreased knowledge of precautions;Decreased cognition;Impaired tone;Decreased range of motion;Decreased activity tolerance;Decreased skin integrity;Decreased mobility;Decreased knowledge of use of DME;Pain       PT Treatment Interventions DME instruction;Therapeutic activities;Cognitive remediation;Gait training;Therapeutic exercise;Patient/family education;Functional mobility  training;Balance training;Neuromuscular re-education    PT Goals (Current goals can be found in the Care Plan section)  Acute Rehab PT Goals Patient Stated Goal: to go home PT Goal Formulation: With patient Time For Goal Achievement: 09/04/23 Potential to Achieve Goals: Good    Frequency Min 1X/week     Co-evaluation               AM-PAC PT "6 Clicks" Mobility  Outcome Measure Help needed turning from your back to your side while in a flat bed without using bedrails?: A Lot Help needed moving from lying on your back to sitting on the side of a flat bed without using bedrails?: A Lot Help needed moving to and from a bed to a chair (including a wheelchair)?: A Lot Help needed standing up from a chair using your arms (e.g., wheelchair or bedside chair)?: A Lot Help needed to walk in hospital room?: A Lot Help needed climbing 3-5 steps with a railing? : Total 6 Click Score: 11    End of Session Equipment Utilized During Treatment: Gait belt Activity Tolerance: Patient tolerated treatment well Patient left: in chair;with chair alarm set;with call bell/phone within reach Nurse Communication: Mobility status PT Visit Diagnosis: Unsteadiness on feet (R26.81);Difficulty in walking, not elsewhere classified (R26.2);Other symptoms and signs involving the nervous system (R29.898);Pain Pain - Right/Left: Right Pain - part of body: Knee    Time: 1610-9604 PT Time Calculation (min) (ACUTE ONLY): 36 min   Charges:  PT Evaluation $PT Eval Low Complexity: 1 Low PT Treatments $Gait Training: 8-22 mins PT General Charges $$ ACUTE PT VISIT: 1 Visit         Blanchard Kelch PT Acute Rehabilitation Services Office [phone] Weekend [phone]   Rada Hay 08/22/2023, 5:00 PM

## 2023-08-22 NOTE — Progress Notes (Signed)
Progress Note   Patient: Pamela Patton JXB:[id number] DOB: 1946/06/07 DOA: 08/21/2023     1 DOS: the patient was seen and examined on 08/22/2023 at 11:06AM      Brief hospital course: Pamela Patton is a 77 y.o. F with Parkinson's disease, DM, HTN, and HLD who presented with several falls this week, finally too weak to stand.  In the ER, CK 5084, Cr 1.6 from baseline 1.2.  UA normal, CXR clear, COVID-.  Admitted on IV fluids for falls and rhabdo.     Assessment and Plan: * Generalized weakness Dehydration Elevated troponin Rhabdomyolysis Falls No focal signs of infection.   TSH, B12 normal.    Presumably falls from Parkinson's, compounded by missed Sinemet and dehydration while being down on the ground for a prolonged period several times.  Less likely one of the metabolic causes of weakness.  CK 5000 > 2000 - IV fluids - Check Ammonia, RPR - Eval of anemia as below - Trend CK   Anemia of chronic disease Baseline is gradually trending down from baseline 9-11.  Hgb currently in the 8s.  - Check iron studies - Check smear, retics - Trend Hgb   DM2 (diabetes mellitus, type 2) with hyperglycemia Hyperglycemia noted this afternoon -Hold Actos -Resume home glargine - Continue sliding scale corrections, titrate up dose -Resume low-dose aspirin  AKI (acute kidney injury) (HCC) Creatinine 1.6 on admission, creatinine resolved to baseline 1.0 with fluids  Transaminitis No focal symptoms - Obtain US abdomen - Resume diet after  Sacral wound Likely from lying in the tub during one of her falls this week - WOC  Hyperlipidemia -Resume Lipitor  Essential hypertension BP controlled -Hold HCTZ given dehydration -hold losartan until hemodynamics clear  Parkinson's disease (HCC) -Continue home Sinemet, 2 tabs 3 times daily  Acquired hypothyroidism TSH normal -Continue levothyroxine          Subjective: Patient has no complaints, no headache, chest pain,  dyspnea, abdominal pain, dysuria, cough, fever.  Overall she just feels weak and tired.     Physical Exam: BP 134/67 (BP Location: Right Arm)   Pulse 61   Temp 98.2 F (36.8 C) (Oral)   Resp 16   Ht 5\' 6"  (1.676 m)   Wt 59 kg   SpO2 99%   BMI 20.99 kg/m   Elderly adult female, lying in bed, interactive and appropriate, but overall appears similar to her examination by Neurologist in August (slow processing speed, hypomimia, upgaze paresis, pseudobulbar) Oriented to person, place and time.  4-/5 or 3+/5 weakness in all four extremities.  No cogwheeling. RRR, no murmurs, no peripheral edema Respiratory normal, lungs clear without rales or wheezes Abdomen soft without tenderness palpation or guarding, no ascites or distention    Data Reviewed: Basic metabolic panel shows normal sodium potassium, creatinine resolved to baseline Glucose is silverly elevated AST slightly elevated CK down to 2326 Troponin low and flat, likely this is from rhabdo, not from myocardial injury, no further ischemic workup needed Hemoglobin down to 8.4 TSH normal Urinalysis unremarkable Echocardiogram normal    Family Communication: Son by phone    Disposition: Status is: Inpatient The patient presented with several falls this week in which she was down for prolonged period of time  She has mild rhabdo and dehydration, but this is improving.  At the moment she appears significantly weaker than her baseline (lives independently, has a friend check on her once daily)        Author: Earl Lites  Emmalou Hunger, MD 08/22/2023 5:18 PM  For on call review [email or web address].

## 2023-08-23 ENCOUNTER — Inpatient Hospital Stay (HOSPITAL_COMMUNITY): Payer: Medicare PPO

## 2023-08-23 DIAGNOSIS — R531 Weakness: Secondary | ICD-10-CM | POA: Diagnosis not present

## 2023-08-23 DIAGNOSIS — S31000D Unspecified open wound of lower back and pelvis without penetration into retroperitoneum, subsequent encounter: Secondary | ICD-10-CM | POA: Diagnosis not present

## 2023-08-23 DIAGNOSIS — N179 Acute kidney failure, unspecified: Secondary | ICD-10-CM | POA: Diagnosis not present

## 2023-08-23 DIAGNOSIS — T796XXS Traumatic ischemia of muscle, sequela: Secondary | ICD-10-CM | POA: Diagnosis not present

## 2023-08-23 LAB — IRON AND TIBC
Iron: 27 ug/dL — ABNORMAL LOW (ref 28–170)
Saturation Ratios: 11 % (ref 10.4–31.8)
TIBC: 244 ug/dL — ABNORMAL LOW (ref 250–450)
UIBC: 217 ug/dL

## 2023-08-23 LAB — DIFFERENTIAL
Abs Immature Granulocytes: 0.12 10*3/uL — ABNORMAL HIGH (ref 0.00–0.07)
Basophils Absolute: 0 10*3/uL (ref 0.0–0.1)
Basophils Relative: 1 %
Eosinophils Absolute: 0.1 10*3/uL (ref 0.0–0.5)
Eosinophils Relative: 1 %
Immature Granulocytes: 1 %
Lymphocytes Relative: 13 %
Lymphs Abs: 1.1 10*3/uL (ref 0.7–4.0)
Monocytes Absolute: 0.8 10*3/uL (ref 0.1–1.0)
Monocytes Relative: 9 %
Neutro Abs: 6.5 10*3/uL (ref 1.7–7.7)
Neutrophils Relative %: 75 %

## 2023-08-23 LAB — TECHNOLOGIST SMEAR REVIEW: Plt Morphology: NORMAL

## 2023-08-23 LAB — CBC
HCT: 27.6 % — ABNORMAL LOW (ref 36.0–46.0)
Hemoglobin: 8.5 g/dL — ABNORMAL LOW (ref 12.0–15.0)
MCH: 29.3 pg (ref 26.0–34.0)
MCHC: 30.8 g/dL (ref 30.0–36.0)
MCV: 95.2 fL (ref 80.0–100.0)
Platelets: 184 10*3/uL (ref 150–400)
RBC: 2.9 MIL/uL — ABNORMAL LOW (ref 3.87–5.11)
RDW: 15.3 % (ref 11.5–15.5)
WBC: 8.6 10*3/uL (ref 4.0–10.5)
nRBC: 0 % (ref 0.0–0.2)

## 2023-08-23 LAB — AMMONIA: Ammonia: 33 umol/L (ref 9–35)

## 2023-08-23 LAB — RETICULOCYTES
Immature Retic Fract: 13 % (ref 2.3–15.9)
RBC.: 2.89 MIL/uL — ABNORMAL LOW (ref 3.87–5.11)
Retic Count, Absolute: 27.5 10*3/uL (ref 19.0–186.0)
Retic Ct Pct: 1 % (ref 0.4–3.1)

## 2023-08-23 LAB — GLUCOSE, CAPILLARY
Glucose-Capillary: 165 mg/dL — ABNORMAL HIGH (ref 70–99)
Glucose-Capillary: 104 mg/dL — ABNORMAL HIGH (ref 70–99)
Glucose-Capillary: 107 mg/dL — ABNORMAL HIGH (ref 70–99)
Glucose-Capillary: 128 mg/dL — ABNORMAL HIGH (ref 70–99)

## 2023-08-23 LAB — COMPREHENSIVE METABOLIC PANEL
ALT: 12 U/L (ref 0–44)
AST: 57 U/L — ABNORMAL HIGH (ref 15–41)
Albumin: 3.1 g/dL — ABNORMAL LOW (ref 3.5–5.0)
Alkaline Phosphatase: 43 U/L (ref 38–126)
Anion gap: 7 (ref 5–15)
BUN: 39 mg/dL — ABNORMAL HIGH (ref 8–23)
CO2: 26 mmol/L (ref 22–32)
Calcium: 8.6 mg/dL — ABNORMAL LOW (ref 8.9–10.3)
Chloride: 108 mmol/L (ref 98–111)
Creatinine, Ser: 1.42 mg/dL — ABNORMAL HIGH (ref 0.44–1.00)
GFR, Estimated: 38 mL/min — ABNORMAL LOW (ref >60)
Glucose, Bld: 186 mg/dL — ABNORMAL HIGH (ref 70–99)
Potassium: 3.9 mmol/L (ref 3.5–5.1)
Sodium: 141 mmol/L (ref 135–145)
Total Bilirubin: 1 mg/dL (ref <1.2)
Total Protein: 6 g/dL — ABNORMAL LOW (ref 6.5–8.1)

## 2023-08-23 LAB — RPR: RPR Ser Ql: NONREACTIVE

## 2023-08-23 LAB — FERRITIN: Ferritin: 93 ng/mL (ref 11–307)

## 2023-08-23 LAB — CK: Total CK: 1479 U/L — ABNORMAL HIGH (ref 38–234)

## 2023-08-23 MED ORDER — SODIUM CHLORIDE 0.9 % IV SOLN: INTRAVENOUS | Status: DC

## 2023-08-23 NOTE — TOC Progression Note (Signed)
Transition of Care [facility]) - Progression Note    Patient Details  Name: Pamela Patton MRN: [id number] Date of Birth: 06-16-1946  Transition of [facility]) CM/SW Contact  Kristyna Bradstreet, Olegario Messier, RN Phone Number: 08/23/2023, 2:59 PM  Clinical Narrative: Noted per CIR not appropriate for inpt rehab;will await PT assessment for venue if ST SNF vs HHC.      Expected Discharge Plan:  (TBD) Barriers to Discharge: Continued Medical Work up  Expected Discharge Plan and Services                                               Social Determinants of Health (SDOH) Interventions SDOH Screenings   Food Insecurity: No Food Insecurity (08/21/2023)  Housing: Low Risk  (08/21/2023)  Transportation Needs: No Transportation Needs (08/21/2023)  Utilities: Not At Risk (08/21/2023)  Alcohol Screen: Low Risk  (12/29/2021)  Depression (PHQ2-9): Low Risk  (01/02/2023)  Financial Resource Strain: Low Risk  (06/01/2023)  Physical Activity: Inactive (01/02/2023)  Social Connections: Unknown (06/01/2023)  Stress: Stress Concern Present (06/01/2023)  Tobacco Use: Low Risk  (08/21/2023)    Readmission Risk Interventions     No data to display

## 2023-08-23 NOTE — Progress Notes (Signed)
PROGRESS NOTE    Pamela Patton  DGL:[id number] DOB: 01-08-1946 DOA: 08/21/2023 PCP: Pamela Keens, DO    Chief Complaint  Patient presents with   Fall   Weakness    Brief Narrative:  Pamela Patton is a 77 y.o. F with Parkinson's disease, DM, HTN, and HLD who presented with several falls this week, finally too weak to stand.   In the ER, CK 5084, Cr 1.6 from baseline 1.2.  UA normal, CXR clear, COVID-.  Admitted on IV fluids for falls and rhabdo.   Assessment & Plan:   Principal Problem:   Generalized weakness Active Problems:   Rhabdomyolysis   Elevated troponin   Dehydration   Anemia of chronic disease   Acquired hypothyroidism   Parkinson's disease (HCC)   Essential hypertension   Hyperlipidemia   Falls   Sacral wound   AKI (acute kidney injury) (HCC)   DM2 (diabetes mellitus, type 2) (HCC)  #1 rhabdomyolysis/generalized weakness/falls/dehydration -Secondary to falls from Parkinson's compounded by [name] and dehydration while being down on the ground for prolonged periods several times. -Less likely to be other metabolic cause of weakness. -CK on admission noted at 5084 trending down currently at 1479. -LFTs trending down. -Renal function with creatinine currently at 1.42 was 1.62 on admission. -Vitamin B12 531 -Ammonia level of 33. -RPR nonreactive. -Influenza PCR negative.  Respiratory syncytial virus by PCR negative.  SARS coronavirus 2 PCR negative. -Place back on IV fluids, follow CK levels. -PT/OT.  2.  Anemia of chronic disease -Hemoglobin currently at 8.5 from 11.0 on admission likely dilutional and hemoglobin of 11.0 likely more secondary to dehydration. -Patient with no overt bleeding. -Anemia panel with iron level of 27, TIBC of 244, ferritin of 93. -Follow H&H.  3.  Diabetes mellitus type 2 with hyperglycemia -Hemoglobin A1c 7.8 (04/27/2023) -CBG 165 this morning. -Continue to hold home regimen Actos. -Continue Semglee 5 units  daily, SSI.  4.  AKI -Creatinine noted at 1.6 on admission improving but trending back up with creatinine currently at 1.42 today. -Continue to hold losartan. -IV fluids.  5.  Transaminitis -Likely secondary to rhabdomyolysis. -LFTs trending down. 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  Bilateral renal cysts.  Heterogeneous lesion in the left kidney towards the lower pole again seen consistent with areas of prior RF ablation. -Follow.  6.  Hyperlipidemia -Statin.  7.  Hypertension -Continue to hold HCTZ secondary to presentation with dehydration and AKI. -Cozaar on hold. -Start Norvasc 2.5 mg daily.  8.  Hypothyroidism -Continue home Synthroid.  9.  Sacral wound -Felt secondary to line intact during one of her falls. -Wound care RN consulted.  #10 Parkinson's disease -Continue Sinemet.     DVT prophylaxis: SCDs Code Status: Full Family Communication: Updated patient.  No family at bedside.  He Disposition: TBD  Status is: Inpatient Remains inpatient appropriate because: Severity of illness   Consultants:  None  Procedures:  CT head 08/21/2023 CT C-spine 08/21/2023 Chest x-ray 08/21/2023 Plain films of bilateral knees 08/21/2023 Plain films the pelvis 08/21/2023 Abdominal ultrasound 08/23/2023 2 d echo 08/22/2023  Antimicrobials:  Anti-infectives (From admission, onward)    None         Subjective: Patient sitting up in recliner pouring water in a cup to drink.  Denies any chest pain no shortness of breath.  No  abdominal pain.  Stated was able to stand today on both feet and ambulate a little bit with a little bit of swaying.  Overall feels well.  Objective: Vitals:   08/22/23 1305 08/22/23 2001 08/23/23 0436 08/23/23 0500  BP: 134/67 104/64 (!) 142/61   Pulse: 61 66  66   Resp: 16 17 18    Temp: 98.2 F (36.8 C) 98.7 F (37.1 C) 98.5 F (36.9 C)   TempSrc: Oral Oral Oral   SpO2: 99% 100% 98%   Weight:    60.1 kg  Height:        Intake/Output Summary (Last 24 hours) at 08/23/2023 1150 Last data filed at 08/22/2023 1346 Gross per 24 hour  Intake 120 ml  Output --  Net 120 ml   Filed Weights   08/22/23 0449 08/22/23 0528 08/23/23 0500  Weight: 57.5 kg 59 kg 60.1 kg    Examination:  General exam: Appears calm and comfortable  Respiratory system: Clear to auscultation.  No wheezes, no crackles, no rhonchi.  Respiratory effort normal. Cardiovascular system: S1 & S2 heard, RRR. No JVD, murmurs, rubs, gallops or clicks. No pedal edema. Gastrointestinal system: Abdomen is nondistended, soft and nontender. No organomegaly or masses felt. Normal bowel sounds heard. Central nervous system: Alert and oriented.  Slow processing speed, upgaze paresis, oriented to person place and time.  No focal neurological deficits. Extremities: Symmetric 5 x 5 power. Skin: No rashes, lesions or ulcers Psychiatry: Judgement and insight appear normal. Mood & affect appropriate.     Data Reviewed: I have personally reviewed following labs and imaging studies  CBC: Recent Labs  Lab 08/21/23 1648 08/22/23 0510 08/23/23 0509  WBC 12.2* 9.2 8.6  NEUTROABS 9.9* 7.3 6.5  HGB 11.0* 8.4* 8.5*  HCT 35.9* 27.1* 27.6*  MCV 96.5 95.4 95.2  PLT 174 168 184    Basic Metabolic Panel: Recent Labs  Lab 08/21/23 1648 08/22/23 0510 08/23/23 0509  NA 143 143 141  K 4.2 3.7 3.9  CL 108 111 108  CO2 23 24 26   GLUCOSE 199* 248* 186*  BUN 47* 39* 39*  CREATININE 1.62* 1.02* 1.42*  CALCIUM 9.5 8.6* 8.6*  MG  --  1.9  --   PHOS  --  2.5  --     GFR: Estimated Creatinine Clearance: 30.6 mL/min (A) (by C-G formula based on SCr of 1.42 mg/dL (H)).  Liver Function Tests: Recent Labs  Lab 08/21/23 1648 08/22/23 0510 08/23/23 0509  AST 114* 73* 57*  ALT 8 21 12    ALKPHOS 50 38 43  BILITOT 1.1 1.1 1.0  PROT 7.5 5.9* 6.0*  ALBUMIN 4.1 3.1* 3.1*    CBG: Recent Labs  Lab 08/22/23 0736 08/22/23 1116 08/22/23 1636 08/22/23 2112 08/23/23 0751  GLUCAP 200* 103* 210* 156* 165*     Recent Results (from the past 240 hour(s))  Resp panel by RT-PCR (RSV, Flu A&B, Covid) Anterior Nasal Swab     Status: None   Collection Time: 08/21/23  5:30 PM   Specimen: Anterior Nasal Swab  Result Value Ref Range Status   SARS Coronavirus 2 by RT PCR NEGATIVE NEGATIVE Final    Comment: (NOTE) SARS-CoV-2 target nucleic acids are NOT DETECTED.  The SARS-CoV-2 RNA is generally detectable in upper respiratory specimens during the acute phase of infection. The lowest concentration of SARS-CoV-2 viral copies this assay can detect is 138 copies/mL. A negative result does not preclude SARS-Cov-2 infection and should not be used as the sole basis  for treatment or other patient management decisions. A negative result may occur with  improper specimen collection/handling, submission of specimen other than nasopharyngeal swab, presence of viral mutation(s) within the areas targeted by this assay, and inadequate number of viral copies(<138 copies/mL). A negative result must be combined with clinical observations, patient history, and epidemiological information. The expected result is Negative.  Fact Sheet for Patients:  BloggerCourse.com  Fact Sheet for Healthcare Providers:  SeriousBroker.it  This test is no t yet approved or cleared by the [place] FDA and  has been authorized for detection and/or diagnosis of SARS-CoV-2 by FDA under an Emergency Use Authorization (EUA). This EUA will remain  in effect (meaning this test can be used) for the duration of the COVID-19 declaration under Section 564(b)(1) of the Act, 21 U.S.C.section 360bbb-3(b)(1), unless the authorization is terminated  or revoked sooner.        Influenza A by PCR NEGATIVE NEGATIVE Final   Influenza B by PCR NEGATIVE NEGATIVE Final    Comment: (NOTE) The Xpert Xpress SARS-CoV-2/FLU/RSV plus assay is intended as an aid in the diagnosis of influenza from Nasopharyngeal swab specimens and should not be used as a sole basis for treatment. Nasal washings and aspirates are unacceptable for Xpert Xpress SARS-CoV-2/FLU/RSV testing.  Fact Sheet for Patients: BloggerCourse.com  Fact Sheet for Healthcare Providers: SeriousBroker.it  This test is not yet approved or cleared by the [place] FDA and has been authorized for detection and/or diagnosis of SARS-CoV-2 by FDA under an Emergency Use Authorization (EUA). This EUA will remain in effect (meaning this test can be used) for the duration of the COVID-19 declaration under Section 564(b)(1) of the Act, 21 U.S.C. section 360bbb-3(b)(1), unless the authorization is terminated or revoked.     Resp Syncytial Virus by PCR NEGATIVE NEGATIVE Final    Comment: (NOTE) Fact Sheet for Patients: BloggerCourse.com  Fact Sheet for Healthcare Providers: SeriousBroker.it  This test is not yet approved or cleared by the [place] FDA and has been authorized for detection and/or diagnosis of SARS-CoV-2 by FDA under an Emergency Use Authorization (EUA). This EUA will remain in effect (meaning this test can be used) for the duration of the COVID-19 declaration under Section 564(b)(1) of the Act, 21 U.S.C. section 360bbb-3(b)(1), unless the authorization is terminated or revoked.  Performed at [facility], [address], [place], [place] [address]          Radiology Studies: US Abdomen Complete  Result Date: 08/23/2023 CLINICAL DATA:  Transaminitis EXAM: ABDOMEN ULTRASOUND COMPLETE COMPARISON:  MRI 02/10/2023. FINDINGS: Gallbladder: Distended  gallbladder. No shadowing stones. No wall thickening or adjacent fluid. Common bile duct: Diameter: 4 mm Liver: Mild intrahepatic biliary duct ectasia. Similar to prior MRI in retrospect. Portal vein is patent on color Doppler imaging with normal direction of blood flow towards the liver. IVC: No abnormality visualized. Pancreas: Mild pancreatic duct ectasia of 3 mm. Cystic areas in the pancreas as well measuring up to 14 by 10 x 17 mm. Spleen: Size and appearance within normal limits. Right Kidney: Length: 10.6 cm. No collecting system dilatation. Benign-appearing cysts, anechoic with through transmission and smooth thin walls. Largest measures 5.4 cm. Left Kidney: Length: 10.4 cm. Benign small appearing cyst measuring 14 mm. There is heterogeneous hypoechoic area identified measuring 3.3 cm. Please correlate with history of prior mass ablation Abdominal aorta: No aneurysm visualized. Other findings: None. IMPRESSION: No gallstones. The common duct is nondilated but there is some stable mild intrahepatic biliary  duct ectasia going back to the previous MRI. 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 Please correlate with the prior MRI recommendations. Bilateral renal cysts. Heterogeneous lesion in the left kidney towards the lower pole again seen consistent with the areas of prior RF ablation. Standard post ablation follow up recommended Electronically Signed   By: Karen Kays M.D.   On: 08/23/2023 11:36   ECHOCARDIOGRAM COMPLETE  Result Date: 08/22/2023    ECHOCARDIOGRAM REPORT   Patient Name:   SHENG INBODEN Date of Exam: 08/22/2023 Medical Rec #:  [id number]     Height:       66.0 in Accession #:    [id number]    Weight:       130.1 lb Date of Birth:  Sep 19, 1946    BSA:          1.666 m Patient Age:    58 years      BP:           122/57 mmHg Patient Gender: F             HR:           70 bpm. Exam Location:  Inpatient Procedure: 2D Echo, 3D Echo, Cardiac Doppler, Color Doppler and  Strain Analysis Indications:    Elevated Troponin  History:        Patient has prior history of Echocardiogram examinations, most                 recent 04/19/2010. Risk Factors:Hypertension, Diabetes and                 Dyslipidemia.  Sonographer:    Karma Ganja Referring Phys: [id number] Angie Fava  Sonographer Comments: Global longitudinal strain was attempted. IMPRESSIONS  1. Left ventricular ejection fraction, by estimation, is 60 to 65%. Left ventricular ejection fraction by 3D volume is 64 %. The left ventricle has normal function. The left ventricle has no regional wall motion abnormalities. There is mild concentric left ventricular hypertrophy. Left ventricular diastolic parameters are consistent with Grade I diastolic dysfunction (impaired relaxation). The average left ventricular global longitudinal strain is -20.9 %. The global longitudinal strain is normal.  2. Right ventricular systolic function is normal. The right ventricular size is normal. There is mildly elevated pulmonary artery systolic pressure. The estimated right ventricular systolic pressure is 38.0 mmHg.  3. Left atrial size was moderately dilated.  4. The mitral valve is normal in structure. No evidence of mitral valve regurgitation. No evidence of mitral stenosis.  5. The aortic valve is normal in structure. Aortic valve regurgitation is not visualized. No aortic stenosis is present.  6. The inferior vena cava is normal in size with greater than 50% respiratory variability, suggesting right atrial pressure of 3 mmHg. FINDINGS  Left Ventricle: Left ventricular ejection fraction, by estimation, is 60 to 65%. Left ventricular ejection fraction by 3D volume is 64 %. The left ventricle has normal function. The left ventricle has no regional wall motion abnormalities. The average left ventricular global longitudinal strain is -20.9 %. The global longitudinal strain is normal. The left ventricular internal cavity size was normal in size.  There is mild concentric left ventricular hypertrophy. Left ventricular diastolic parameters are consistent with Grade I diastolic dysfunction (impaired relaxation). Normal left ventricular filling pressure. Right Ventricle: The right ventricular size is normal. No increase in right ventricular wall thickness. Right ventricular systolic function is normal. There is mildly elevated pulmonary artery systolic pressure. The  tricuspid regurgitant velocity is 2.96  m/s, and with an assumed right atrial pressure of 3 mmHg, the estimated right ventricular systolic pressure is 38.0 mmHg. Left Atrium: Left atrial size was moderately dilated. Right Atrium: Right atrial size was normal in size. Pericardium: There is no evidence of pericardial effusion. Mitral Valve: The mitral valve is normal in structure. Mild mitral annular calcification. No evidence of mitral valve regurgitation. No evidence of mitral valve stenosis. Tricuspid Valve: The tricuspid valve is normal in structure. Tricuspid valve regurgitation is trivial. No evidence of tricuspid stenosis. Aortic Valve: The aortic valve is normal in structure. Aortic valve regurgitation is not visualized. No aortic stenosis is present. Aortic valve mean gradient measures 10.0 mmHg. Aortic valve peak gradient measures 19.5 mmHg. Aortic valve area, by VTI measures 1.85 cm. Pulmonic Valve: The pulmonic valve was normal in structure. Pulmonic valve regurgitation is not visualized. No evidence of pulmonic stenosis. Aorta: The aortic root is normal in size and structure. Venous: The inferior vena cava is normal in size with greater than 50% respiratory variability, suggesting right atrial pressure of 3 mmHg. IAS/Shunts: No atrial level shunt detected by color flow Doppler.  LEFT VENTRICLE PLAX 2D LVIDd:         4.00 cm         Diastology LVIDs:         2.60 cm         LV e' medial:    5.87 cm/s LV PW:         1.30 cm         LV E/e' medial:  12.4 LV IVS:        1.20 cm         LV e'  lateral:   8.05 cm/s LVOT diam:     2.00 cm         LV E/e' lateral: 9.0 LV SV:         75 LV SV Index:   45              2D LVOT Area:     3.14 cm        Longitudinal                                Strain                                2D Strain GLS  -20.8 %                                (A2C):                                2D Strain GLS  -19.4 %                                (A3C):                                2D Strain GLS  -22.6 %                                (  A4C):                                2D Strain GLS  -20.9 %                                Avg:                                 3D Volume EF                                LV 3D EF:    Left                                             ventricul                                             ar                                             ejection                                             fraction                                             by 3D                                             volume is                                             64 %.                                 3D Volume EF:                                3D EF:        64 %                                LV EDV:       86 ml  LV ESV:       31 ml                                LV SV:        55 ml RIGHT VENTRICLE             IVC RV Basal diam:  3.50 cm     IVC diam: 1.90 cm RV S prime:     16.20 cm/s TAPSE (M-mode): 2.7 cm LEFT ATRIUM             Index        RIGHT ATRIUM           Index LA diam:        4.40 cm 2.64 cm/m   RA Area:     10.40 cm LA Vol (A2C):   75.9 ml 45.57 ml/m  RA Volume:   20.00 ml  12.01 ml/m LA Vol (A4C):   65.3 ml 39.20 ml/m LA Biplane Vol: 70.4 ml 42.27 ml/m  AORTIC VALVE AV Area (Vmax):    1.95 cm AV Area (Vmean):   1.71 cm AV Area (VTI):     1.85 cm AV Vmax:           221.00 cm/s AV Vmean:          141.000 cm/s AV VTI:            0.405 m AV Peak Grad:      19.5 mmHg AV Mean Grad:      10.0 mmHg LVOT Vmax:         137.00  cm/s LVOT Vmean:        76.600 cm/s LVOT VTI:          0.238 m LVOT/AV VTI ratio: 0.59  AORTA Ao Root diam: 2.70 cm Ao Asc diam:  2.70 cm MITRAL VALVE               TRICUSPID VALVE MV Area (PHT): 2.48 cm    TR Peak grad:   35.0 mmHg MV Decel Time: 306 msec    TR Vmax:        296.00 cm/s MV E velocity: 72.80 cm/s MV A velocity: 95.50 cm/s  SHUNTS MV E/A ratio:  0.76        Systemic VTI:  0.24 m                            Systemic Diam: 2.00 cm Armanda Magic MD Electronically signed by Armanda Magic MD Signature Date/Time: 08/22/2023/10:01:12 AM    Final    DG Pelvis 1-2 Views  Result Date: 08/21/2023 CLINICAL DATA:  Status post fall. EXAM: PELVIS - 1-2 VIEW COMPARISON:  None Available. FINDINGS: There is no evidence of pelvic fracture or diastasis. No pelvic bone lesions are seen. Mild to moderate severity degenerative changes seen involving both hips, in the form of joint space narrowing and acetabular sclerosis. IMPRESSION: Degenerative changes involving both hips. Electronically Signed   By: Aram Candela M.D.   On: 08/21/2023 20:45   DG Knee Complete 4 Views Right  Result Date: 08/21/2023 CLINICAL DATA:  Status post fall. EXAM: RIGHT KNEE - COMPLETE 4+ VIEW COMPARISON:  None Available. FINDINGS: No evidence of an acute fracture or dislocation. Medial and lateral marginal osteophytes are noted. There is moderate to marked severity tricompartmental joint space narrowing. A small joint effusion  is noted. IMPRESSION: 1. 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 2. Small joint effusion. Electronically Signed   By: Aram Candela M.D.   On: 08/21/2023 20:44   DG Knee Complete 4 Views Left  Result Date: 08/21/2023 CLINICAL DATA:  Status post fall. EXAM: LEFT KNEE - COMPLETE 4+ VIEW COMPARISON:  None Available. FINDINGS: No evidence of fracture, dislocation, or joint effusion. Medial and lateral marginal osteophytes are noted. There is moderate to marked severity tricompartmental  joint space narrowing. Soft tissues are unremarkable. 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 Electronically Signed   By: Aram Candela M.D.   On: 08/21/2023 20:43   CT HEAD WO CONTRAST ( )  Result Date: 08/21/2023 CLINICAL DATA:  Multiple falls over the past several days EXAM: CT HEAD WITHOUT CONTRAST CT CERVICAL SPINE WITHOUT CONTRAST TECHNIQUE: Multidetector CT imaging of the head and cervical spine was performed following the standard protocol without intravenous contrast. Multiplanar CT image reconstructions of the cervical spine were also generated. RADIATION DOSE REDUCTION: This exam was performed according to the departmental dose-optimization program which includes automated exposure control, adjustment of the mA and/or kV according to patient size and/or use of iterative reconstruction technique. COMPARISON:  01/18/2023 CT head and cervical spine FINDINGS: CT HEAD FINDINGS Brain: No evidence of acute infarct, hemorrhage, mass, mass effect, or midline shift. No hydrocephalus or extra-axial fluid collection. Periventricular white matter changes, likely the sequela of chronic small vessel ischemic disease. Vascular: No hyperdense vessel. Skull: Negative for fracture or focal lesion. Sinuses/Orbits: No acute finding. Status post bilateral lens replacements. Other: The mastoid air cells are well aerated. CT CERVICAL SPINE FINDINGS Alignment: No traumatic listhesis. Skull base and vertebrae: No acute fracture or suspicious osseous lesion. Soft tissues and spinal canal: No prevertebral fluid or swelling. No visible canal hematoma. Disc levels: Degenerative changes in the cervical spine.No high-grade spinal canal stenosis. Upper chest: No focal pulmonary opacity or pleural effusion. IMPRESSION: 1. No acute intracranial process. 2. No acute fracture or traumatic listhesis in the cervical spine. Electronically Signed   By: Wiliam Ke M.D.   On: 08/21/2023 20:43   CT  Cervical Spine Wo Contrast  Result Date: 08/21/2023 CLINICAL DATA:  Multiple falls over the past several days EXAM: CT HEAD WITHOUT CONTRAST CT CERVICAL SPINE WITHOUT CONTRAST TECHNIQUE: Multidetector CT imaging of the head and cervical spine was performed following the standard protocol without intravenous contrast. Multiplanar CT image reconstructions of the cervical spine were also generated. RADIATION DOSE REDUCTION: This exam was performed according to the departmental dose-optimization program which includes automated exposure control, adjustment of the mA and/or kV according to patient size and/or use of iterative reconstruction technique. COMPARISON:  01/18/2023 CT head and cervical spine FINDINGS: CT HEAD FINDINGS Brain: No evidence of acute infarct, hemorrhage, mass, mass effect, or midline shift. No hydrocephalus or extra-axial fluid collection. Periventricular white matter changes, likely the sequela of chronic small vessel ischemic disease. Vascular: No hyperdense vessel. Skull: Negative for fracture or focal lesion. Sinuses/Orbits: No acute finding. Status post bilateral lens replacements. Other: The mastoid air cells are well aerated. CT CERVICAL SPINE FINDINGS Alignment: No traumatic listhesis. Skull base and vertebrae: No acute fracture or suspicious osseous lesion. Soft tissues and spinal canal: No prevertebral fluid or swelling. No visible canal hematoma. Disc levels: Degenerative changes in the cervical spine.No high-grade spinal canal stenosis. Upper chest: No focal pulmonary opacity or pleural effusion. IMPRESSION: 1. No acute intracranial process. 2. No acute fracture or traumatic listhesis in the cervical spine.  Electronically Signed   By: Wiliam Ke M.D.   On: 08/21/2023 20:43   DG Chest 2 View  Result Date: 08/21/2023 CLINICAL DATA:  Status post fall. EXAM: CHEST - 2 VIEW COMPARISON:  November 28, 2017 FINDINGS: The heart size and mediastinal contours are within normal limits.  There is moderate severity calcification of the aortic arch and tortuosity of the descending thoracic aorta. Both lungs are clear. The visualized skeletal structures are unremarkable. IMPRESSION: No active cardiopulmonary disease. Electronically Signed   By: Aram Candela M.D.   On: 08/21/2023 20:42        Scheduled Meds:  aspirin EC  81 mg Oral QHS   atorvastatin  40 mg Oral QHS   carbidopa-levodopa  2 tablet Oral TID   insulin aspart  0-15 Units Subcutaneous TID WC   insulin aspart  0-5 Units Subcutaneous QHS   insulin glargine-yfgn  5 Units Subcutaneous QHS   leptospermum manuka honey  1 Application Topical Daily   levothyroxine  75 mcg Oral Q0600   Continuous Infusions:  sodium chloride 100 mL/hr at 08/23/23 0849     LOS: 2 days    Time spent: 35 minutes    Ramiro Harvest, MD [facility]   To contact the attending provider between 7A-7P or the covering provider during after hours 7P-7A, please log into the web site [email or web address] and access using universal [place] password for that web site. If you do not have the password, please call the hospital operator.  08/23/2023, 11:50 AM

## 2023-08-23 NOTE — Progress Notes (Signed)
Inpatient Rehab Admissions Coordinator:   Per therapy recommendations, patient was screened for CIR candidacy by Megan Salon, MS, CCC-SLP. At this time, Pt. does not appear to demonstrate medical necessity to justify in hospital rehabilitation/CIR. I do not think her insurance would approve AIR for this admission either. I will not pursue a rehab consult for this Pt.   Recommend other rehab venues to be pursued.  Please contact me with any questions.  Megan Salon, MS, CCC-SLP Rehab Admissions Coordinator  [phone] (celll) [phone] (office)

## 2023-08-23 NOTE — Progress Notes (Signed)
Physical Therapy Treatment Patient Details Name: Pamela Patton MRN: [id number] DOB: 02-17-1946 Today's Date: 08/23/2023   History of Present Illness Pt is a 77 y.o. female admitted with generalized weakness and three recent falls. PMH sig for Parkinson's disease, Type II DM, HTN, hyperlipidemia, acquired hypothyroidism, anemia of chronic disease (associated baseline hemoglobin 9-11)    PT Comments  Pt AxO x 3 pleasant and willing.  Assisted OOB to [facility] then amb in hallway. General transfer comment: requires cues for sequencing and to power up to standing,  Initial rigidity and posterior lean.  Assisted on/off BSC. General Gait Details: started with amb with RW which pt struggled to navigate/coordinate so removed RW then amb without HHA.  Improved ability to amb but still present with short, shuffled steps and anterior lean.  VC's to decrease gait speed and increase step length.  Difficult to correct.  HIGH FALL RISK. LPT has rec AIR.  Perchart review, pt does not meet the medical necessity.  Will updated LPT.  Pt will need ST Rehab at SNF to address mobility and functional decline prior to safely returning home.     If plan is discharge home, recommend the following: A little help with walking and/or transfers;A little help with bathing/dressing/bathroom;Assistance with cooking/housework;Assist for transportation;Help with stairs or ramp for entrance   Can travel by private vehicle        Equipment Recommendations  Rolling walker (2 wheels)    Recommendations for Other Services       Precautions / Restrictions Precautions Precautions: Fall Precaution Comments: Hx Parkinsons Restrictions Weight Bearing Restrictions: No     Mobility  Bed Mobility Overal bed mobility: Needs Assistance Bed Mobility: Supine to Sit, Sit to Supine       Sit to supine: Mod assist, Used rails   General bed mobility comments: assist with  the legs and trunk, patient required support to scoot to bed  edge    Transfers   Equipment used: None, Rolling walker (2 wheels) Transfers: Sit to/from Stand Sit to Stand: Mod assist           General transfer comment: requires cues for sequencing and to power up to standing,  Initial rigidity and posterior lean.  Assisted on/off BSC    Ambulation/Gait Ambulation/Gait assistance: Mod assist Gait Distance (Feet): 34 Feet Assistive device: None, Rolling walker (2 wheels) Gait Pattern/deviations: Step-to pattern, Festinating Gait velocity: decr     General Gait Details: started with amb with RW which pt struggled to navigate/coordinate so removed RW then amb without HHA.  Improved ability to amb but still present with short, shuffled steps and anterior lean.  VC's to decrease gait speed and increase step length.  Difficult to correct.  HIGH FALL RISK.   Stairs             Wheelchair Mobility     Tilt Bed    Modified Rankin (Stroke Patients Only)       Balance                                            Cognition Arousal: Alert Behavior During Therapy: Flat affect, WFL for tasks assessed/performed Overall Cognitive Status: No family/caregiver present to determine baseline cognitive functioning  General Comments: able to provide  recent  history about decline, Oriented to place and mo, yr  and daY        Exercises      General Comments        Pertinent Vitals/Pain Pain Assessment Pain Assessment: Faces Faces Pain Scale: Hurts little more Pain Location: R knee Pain Descriptors / Indicators: Discomfort Pain Intervention(s): Monitored during session, Repositioned    Home Living                          Prior Function            PT Goals (current goals can now be found in the care plan section) Progress towards PT goals: Progressing toward goals    Frequency    Min 1X/week      PT Plan      Co-evaluation               AM-PAC PT "6 Clicks" Mobility   Outcome Measure  Help needed turning from your back to your side while in a flat bed without using bedrails?: A Lot Help needed moving from lying on your back to sitting on the side of a flat bed without using bedrails?: A Lot Help needed moving to and from a bed to a chair (including a wheelchair)?: A Lot Help needed standing up from a chair using your arms (e.g., wheelchair or bedside chair)?: A Lot Help needed to walk in hospital room?: A Lot Help needed climbing 3-5 steps with a railing? : Total 6 Click Score: 11    End of Session Equipment Utilized During Treatment: Gait belt Activity Tolerance: Patient tolerated treatment well Patient left: in bed;with call bell/phone within reach;with bed alarm set Nurse Communication: Mobility status PT Visit Diagnosis: Unsteadiness on feet (R26.81);Difficulty in walking, not elsewhere classified (R26.2);Other symptoms and signs involving the nervous system (R29.898);Pain Pain - Right/Left: Right Pain - part of body: Knee     Time: 6387-5643 PT Time Calculation (min) (ACUTE ONLY): 26 min  Charges:    $Gait Training: 8-22 mins $Therapeutic Activity: 8-22 mins PT General Charges $$ ACUTE PT VISIT: 1 Visit                     {Rosilyn Coachman  PTA Acute  Rehabilitation Services Office M-F          [phone]

## 2023-08-24 DIAGNOSIS — S31000D Unspecified open wound of lower back and pelvis without penetration into retroperitoneum, subsequent encounter: Secondary | ICD-10-CM | POA: Diagnosis not present

## 2023-08-24 DIAGNOSIS — N179 Acute kidney failure, unspecified: Secondary | ICD-10-CM | POA: Diagnosis not present

## 2023-08-24 DIAGNOSIS — T796XXS Traumatic ischemia of muscle, sequela: Secondary | ICD-10-CM | POA: Diagnosis not present

## 2023-08-24 DIAGNOSIS — R531 Weakness: Secondary | ICD-10-CM | POA: Diagnosis not present

## 2023-08-24 LAB — COMPREHENSIVE METABOLIC PANEL
ALT: 8 U/L (ref 0–44)
AST: 42 U/L — ABNORMAL HIGH (ref 15–41)
Albumin: 2.9 g/dL — ABNORMAL LOW (ref 3.5–5.0)
Alkaline Phosphatase: 38 U/L (ref 38–126)
Anion gap: 9 (ref 5–15)
BUN: 28 mg/dL — ABNORMAL HIGH (ref 8–23)
CO2: 22 mmol/L (ref 22–32)
Calcium: 8.4 mg/dL — ABNORMAL LOW (ref 8.9–10.3)
Chloride: 112 mmol/L — ABNORMAL HIGH (ref 98–111)
Creatinine, Ser: 1.03 mg/dL — ABNORMAL HIGH (ref 0.44–1.00)
GFR, Estimated: 56 mL/min — ABNORMAL LOW (ref >60)
Glucose, Bld: 152 mg/dL — ABNORMAL HIGH (ref 70–99)
Potassium: 3.8 mmol/L (ref 3.5–5.1)
Sodium: 143 mmol/L (ref 135–145)
Total Bilirubin: 0.9 mg/dL (ref <1.2)
Total Protein: 5.8 g/dL — ABNORMAL LOW (ref 6.5–8.1)

## 2023-08-24 LAB — GLUCOSE, CAPILLARY
Glucose-Capillary: 170 mg/dL — ABNORMAL HIGH (ref 70–99)
Glucose-Capillary: 240 mg/dL — ABNORMAL HIGH (ref 70–99)
Glucose-Capillary: 111 mg/dL — ABNORMAL HIGH (ref 70–99)
Glucose-Capillary: 112 mg/dL — ABNORMAL HIGH (ref 70–99)

## 2023-08-24 LAB — CBC
HCT: 27.7 % — ABNORMAL LOW (ref 36.0–46.0)
Hemoglobin: 8.6 g/dL — ABNORMAL LOW (ref 12.0–15.0)
MCH: 30.3 pg (ref 26.0–34.0)
MCHC: 31 g/dL (ref 30.0–36.0)
MCV: 97.5 fL (ref 80.0–100.0)
Platelets: 182 10*3/uL (ref 150–400)
RBC: 2.84 MIL/uL — ABNORMAL LOW (ref 3.87–5.11)
RDW: 15.3 % (ref 11.5–15.5)
WBC: 7.6 10*3/uL (ref 4.0–10.5)
nRBC: 0 % (ref 0.0–0.2)

## 2023-08-24 LAB — CK: Total CK: 817 U/L — ABNORMAL HIGH (ref 38–234)

## 2023-08-24 MED ORDER — AMLODIPINE BESYLATE 5 MG PO TABS
2.5000 mg | ORAL_TABLET | Freq: Every day | ORAL | Status: DC
  Administered 2023-08-24: 2.5 mg via ORAL
  Filled 2023-08-24: qty 1

## 2023-08-24 NOTE — Progress Notes (Signed)
Occupational Therapy Treatment Patient Details Name: Pamela Patton MRN: [id number] DOB: 21-Feb-1946 Today's Date: 08/24/2023   History of present illness Pt is a 77 y.o. female admitted with generalized weakness and three recent falls. PMH sig for Parkinson's disease, Type II DM, HTN, hyperlipidemia, acquired hypothyroidism, anemia of chronic disease (associated baseline hemoglobin 9-11)   OT comments  Patient requesting bathroom use.  Mod A for supine to sit and sit to stand, once standing, initial posterior lean, but HHA of one to mobilize to the restroom.  Patient able to perform peri care seated.  OT will continue efforts in the acute setting, and Patient will benefit from continued inpatient follow up therapy, <3 hours/day.  Patient does not have adequate assist at home for the needed Mod A.         If plan is discharge home, recommend the following:  Assist for transportation;Supervision due to cognitive status;A little help with walking and/or transfers;A little help with bathing/dressing/bathroom;Assistance with cooking/housework;Direct supervision/assist for medications management;Direct supervision/assist for financial management   Equipment Recommendations  None recommended by OT    Recommendations for Other Services      Precautions / Restrictions Precautions Precautions: Fall Restrictions Weight Bearing Restrictions: No       Mobility Bed Mobility Overal bed mobility: Needs Assistance Bed Mobility: Supine to Sit       Sit to supine: Mod assist        Transfers Overall transfer level: Needs assistance   Transfers: Sit to/from Stand, Bed to chair/wheelchair/BSC Sit to Stand: Mod assist     Step pivot transfers: Min assist           Balance Overall balance assessment: Needs assistance Sitting-balance support: Feet supported, Bilateral upper extremity supported Sitting balance-Leahy Scale: Fair     Standing balance support: Single extremity  supported Standing balance-Leahy Scale: Poor                             ADL either performed or assessed with clinical judgement   ADL       Grooming: Set up;Sitting       Lower Body Bathing: Sit to/from stand;Moderate assistance   Upper Body Dressing : Minimal assistance;Sitting   Lower Body Dressing: Minimal assistance;Sit to/from stand;Sitting/lateral leans   Toilet Transfer: Minimal assistance;Ambulation   Toileting- Clothing Manipulation and Hygiene: Supervision/safety;Sitting/lateral lean              Extremity/Trunk Assessment Upper Extremity Assessment Upper Extremity Assessment: Generalized weakness   Lower Extremity Assessment Lower Extremity Assessment: Defer to PT evaluation        Vision Patient Visual Report: No change from baseline     Perception Perception Perception: Not tested   Praxis Praxis Praxis: Not tested    Cognition Arousal: Alert Behavior During Therapy: [facility] for tasks assessed/performed Overall Cognitive Status: Within Functional Limits for tasks assessed                                                             Pertinent Vitals/ Pain       Pain Assessment Pain Assessment: No/denies pain Pain Intervention(s): Monitored during session  Frequency  Min 1X/week        Progress Toward Goals  OT Goals(current goals can now be found in the care plan section)  Progress towards OT goals: Progressing toward goals  Acute Rehab OT Goals OT Goal Formulation: With patient Time For Goal Achievement: 09/05/23 Potential to Achieve Goals: Fair  Plan      Co-evaluation                 AM-PAC OT "6 Clicks" Daily Activity     Outcome Measure   Help from another person eating meals?: None Help from another person taking care of personal grooming?: A Little Help from another person toileting, which  includes using toliet, bedpan, or urinal?: A Little Help from another person bathing (including washing, rinsing, drying)?: A Lot Help from another person to put on and taking off regular upper body clothing?: A Little Help from another person to put on and taking off regular lower body clothing?: A Lot 6 Click Score: 17    End of Session Equipment Utilized During Treatment: Gait belt  OT Visit Diagnosis: Repeated falls (R29.6);Muscle weakness (generalized) (M62.81);Unsteadiness on feet (R26.81)   Activity Tolerance Patient tolerated treatment well   Patient Left with call bell/phone within reach;with bed alarm set;in chair   Nurse Communication Mobility status        Time: 1610-9604 OT Time Calculation (min): 16 min  Charges: OT General Charges $OT Visit: 1 Visit OT Treatments $Self Care/Home Management : 8-22 mins  08/24/2023  RP, OTR/L  Acute Rehabilitation Services  Office:  [phone]   Suzanna Obey 08/24/2023, 9:10 AM

## 2023-08-24 NOTE — TOC Progression Note (Signed)
Transition of Care [facility]) - Progression Note    Patient Details  Name: Pamela Patton MRN: [id number] Date of Birth: March 03, 1946  [facility]) CM/SW Contact  Chanelle Hodsdon, [place]legario Mess[place], RN Phone Number: 08/24/2023, 1:19 PM  Clinical Narrative:PT recc ST SNF-per permission from Darian son-faxed out await bed offers,choice, then auth.       Expected Discharge Plan: Skilled Nursing Facility Barriers to Discharge: Continued Medical Work up  Expected Discharge Plan and Services                                               Social Determinants of Health (SDOH) Interventions SDOH Screenings   Food Insecurity: No Food Insecurity (08/21/2023)  Housing: Low Risk  (08/21/2023)  Transportation Needs: No Transportation Needs (08/21/2023)  Utilities: Not At Risk (08/21/2023)  Alcohol Screen: Low Risk  (12/29/2021)  Depression (PHQ2-9): Low Risk  (01/02/2023)  Financial Resource Strain: Low Risk  (06/01/2023)  Physical Activity: Inactive (01/02/2023)  Social Connections: Unknown (06/01/2023)  Stress: Stress Concern Present (06/01/2023)  Tobacco Use: Low Risk  (08/21/2023)    Readmission Risk Interventions     No data to display

## 2023-08-24 NOTE — Plan of Care (Signed)

## 2023-08-24 NOTE — NC FL2 (Signed)
[place] MEDICAID FL2 LEVEL OF CARE FORM     IDENTIFICATION  Patient Name: Pamela Patton Birthdate: 26-Nov-1945 Sex: female Admission Date (Current Location): 08/21/2023  [facility] and [place] Number:  Producer, television/film/video and Address:  [facility],  [address] [place], [place] [address]      Provider Number: [id number]  Attending Physician Name and Address:  Rodolph Bong, MD  Relative Name and Phone Number:  Oretha Caprice Seymour(Son) [phone]    Current Level of Care: Hospital Recommended Level of Care: Skilled Nursing Facility Prior Approval Number:    Date Approved/Denied:   PASRR Number: [id number]  Discharge Plan: SNF    Current Diagnoses: Patient Active Problem List   Diagnosis Date Noted   Generalized weakness 08/21/2023   Falls 08/21/2023   Sacral wound 08/21/2023   Rhabdomyolysis 08/21/2023   AKI (acute kidney injury) (HCC) 08/21/2023   Elevated troponin 08/21/2023   Dehydration 08/21/2023   DM2 (diabetes mellitus, type 2) (HCC) 08/21/2023   Anemia of chronic disease 08/21/2023   Renal cell carcinoma (HCC) 11/18/2022   Renal cell carcinoma of left kidney (HCC) 11/18/2022   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   Carotid artery disease (HCC) 03/21/2017   Essential hypertension 02/22/2017   Hyperlipidemia 02/22/2017   Hyperuricemia 10/31/2016   Parkinson's disease (HCC) 08/08/2016   Loss of weight 07/25/2016   DM (diabetes mellitus), type 2 with renal complications 10/08/2012   CKD (chronic kidney disease) stage 3, GFR 30-59 ml/min (HCC) 10/08/2012   Vitamin D deficiency 08/24/2012   Acquired hypothyroidism     Orientation RESPIRATION BLADDER Height & Weight     Self, Time, Situation, Place  Normal Continent Weight: 62.6 kg Height:  5\' 6"  (167.6 cm)  BEHAVIORAL SYMPTOMS/MOOD NEUROLOGICAL BOWEL NUTRITION STATUS      Continent Diet (Regular)  AMBULATORY STATUS COMMUNICATION OF NEEDS Skin    Limited Assist Verbally PU Stage and Appropriate Care   PU Stage 2 Dressing: Daily (daily dsg;foam q3days prn.)                   Personal Care Assistance Level of Assistance  Bathing, Feeding, Dressing Bathing Assistance: Limited assistance Feeding assistance: Limited assistance Dressing Assistance: Limited assistance     Functional Limitations Info  Sight, Hearing, Speech Sight Info: Impaired (readers) Hearing Info: Impaired (HOH) Speech Info: Adequate    SPECIAL CARE FACTORS FREQUENCY  PT (By licensed PT), OT (By licensed OT)     PT Frequency: 5x week OT Frequency: 5x week            Contractures Contractures Info: Not present    Additional Factors Info  Code Status, Allergies Code Status Info: Full Allergies Info: NKA           Current Medications (08/24/2023):  This is the current hospital active medication list Current Facility-Administered Medications  Medication Dose Route Frequency Provider Last Rate Last Admin   0.9 %  sodium chloride infusion   Intravenous Continuous Rodolph Bong, MD 100 mL/hr at 08/24/23 1157 New Bag at 08/24/23 1157   acetaminophen (TYLENOL) tablet 650 mg  650 mg Oral Q6H PRN Howerter, Justin B, DO   650 mg at 08/22/23 1537   Or   acetaminophen (TYLENOL) suppository 650 mg  650 mg Rectal Q6H PRN Howerter, Justin B, DO       amLODipine (NORVASC) tablet 2.5 mg  2.5 mg Oral Daily Rodolph Bong, MD   2.5  mg at 08/24/23 0935   aspirin EC tablet 81 mg  81 mg Oral QHS Alberteen Sam, MD   81 mg at 08/23/23 2135   atorvastatin (LIPITOR) tablet 40 mg  40 mg Oral QHS Alberteen Sam, MD   40 mg at 08/23/23 2135   carbidopa-levodopa (SINEMET IR) 25-100 MG per tablet immediate release 2 tablet  2 tablet Oral TID Howerter, Justin B, DO   2 tablet at 08/24/23 0935   insulin aspart (novoLOG) injection 0-15 Units  0-15 Units Subcutaneous TID WC Danford, Earl Lites, MD   5 Units at 08/24/23 1148   insulin aspart  (novoLOG) injection 0-5 Units  0-5 Units Subcutaneous QHS Danford, Earl Lites, MD       insulin glargine-yfgn (SEMGLEE) injection 5 Units  5 Units Subcutaneous QHS Alberteen Sam, MD   5 Units at 08/23/23 2135   leptospermum manuka honey (MEDIHONEY) paste 1 Application  1 Application Topical Daily Alberteen Sam, MD   1 Application at 08/24/23 4696   levothyroxine (SYNTHROID) tablet 75 mcg  75 mcg Oral Q0600 Howerter, Justin B, DO   75 mcg at 08/24/23 0531   melatonin tablet 3 mg  3 mg Oral QHS PRN Howerter, Justin B, DO       ondansetron (ZOFRAN) injection 4 mg  4 mg Intravenous Q6H PRN Howerter, Justin B, DO         Discharge Medications: Please see discharge summary for a list of discharge medications.  Relevant Imaging Results:  Relevant Lab Results:   Additional Information [id number], [place], [place]

## 2023-08-24 NOTE — Progress Notes (Signed)
PROGRESS NOTE    KERRE MONGE  VFI:[id number] DOB: 1946-04-26 DOA: 08/21/2023 PCP: Judieth Keens, DO    Chief Complaint  Patient presents with   Fall   Weakness    Brief Narrative:  Mrs. Askey is a 77 y.o. F with Parkinson's disease, DM, HTN, and HLD who presented with several falls this week, finally too weak to stand.   In the ER, CK 5084, Cr 1.6 from baseline 1.2.  UA normal, CXR clear, COVID-.  Admitted on IV fluids for falls and rhabdo.   Assessment & Plan:   Principal Problem:   Generalized weakness Active Problems:   Rhabdomyolysis   Elevated troponin   Dehydration   Anemia of chronic disease   Acquired hypothyroidism   Parkinson's disease (HCC)   Essential hypertension   Hyperlipidemia   Falls   Sacral wound   AKI (acute kidney injury) (HCC)   DM2 (diabetes mellitus, type 2) (HCC)  #1 rhabdomyolysis/generalized weakness/falls/dehydration -Secondary to falls from Parkinson's compounded by [name] and dehydration while being down on the ground for prolonged periods several times. -Less likely to be other metabolic cause of weakness. -CK on admission noted at 5084 trending down currently at 817. -LFTs trending down. -Renal function with creatinine currently at 1.03 from 1.42 was 1.62 on admission. -Vitamin B12 531 -Ammonia level of 33. -RPR nonreactive. -Influenza PCR negative.  Respiratory syncytial virus by PCR negative.  SARS coronavirus 2 PCR negative. -Continue gentle hydration with IV fluids, follow CK levels.   -PT/OT.    2.  Anemia of chronic disease -Hemoglobin currently at 8.6 from 8.5 from 11.0 on admission likely dilutional and hemoglobin of 11.0 likely more secondary to dehydration. -Patient with no overt bleeding. -Anemia panel with iron level of 27, TIBC of 244, ferritin of 93. -Follow H&H.  3.  Diabetes mellitus type 2 with hyperglycemia -Hemoglobin A1c 7.8 (04/27/2023) -CBG 170 this morning. -Continue to hold home regimen  Actos. -Continue Semglee 5 units daily, SSI.  4.  AKI -Creatinine noted at 1.6 on admission was improving initially trended back up to 1.42.   -Losartan held and patient placed on gentle hydration.   -Renal function improving creatinine down to 1.03.   -Gentle hydration for another 24 hours.    5.  Transaminitis -Likely secondary to rhabdomyolysis. -LFTs trending down. 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  Bilateral renal cysts.  Heterogeneous lesion in the left kidney towards the lower pole again seen consistent with areas of prior RF ablation. -Follow.  6.  Hyperlipidemia -DC statin as patient with rhabdomyolysis and could likely resume on discharge.    7.  Hypertension -Continue to hold HCTZ secondary to presentation with dehydration and AKI. -Cozaar on hold. -Continue Norvasc 2.5 mg daily and uptitrate for better blood pressure control.   8.  Hypothyroidism -Synthroid.    9.  Sacral wound -Felt secondary during one of her falls. -Wound care RN consulted.  #10 Parkinson's disease -Sinemet     DVT prophylaxis: SCDs Code Status: Full Family Communication: Updated patient.  No family at bedside.  He Disposition: SNF when bed available.  Status is: Inpatient Remains inpatient appropriate because: Severity of illness   Consultants:  None  Procedures:  CT head 08/21/2023 CT C-spine 08/21/2023 Chest x-ray 08/21/2023 Plain films of bilateral knees 08/21/2023 Plain films the pelvis 08/21/2023 Abdominal  ultrasound 08/23/2023 2 d echo 08/22/2023  Antimicrobials:  Anti-infectives (From admission, onward)    None         Subjective: Sitting up in recliner.  Pleasantly confused.  Denies any chest pain or shortness of breath.  No abdominal pain.  Tolerating current diet.     Objective: Vitals:   08/24/23 0003 08/24/23 0404 08/24/23 0447 08/24/23 0935  BP: (!) 134/48 (!) 158/61  (!) 155/64  Pulse: 60 64    Resp: 17 19    Temp: 98.6 F (37 C) 98.4 F (36.9 C)    TempSrc: Oral Oral    SpO2: 95% 98%    Weight:   62.6 kg   Height:        Intake/Output Summary (Last 24 hours) at 08/24/2023 1005 Last data filed at 08/24/2023 0200 Gross per 24 hour  Intake 1730.55 ml  Output --  Net 1730.55 ml   Filed Weights   08/22/23 0528 08/23/23 0500 08/24/23 0447  Weight: 59 kg 60.1 kg 62.6 kg    Examination:  General exam: NAD. Respiratory system: CTAB.  No wheezes, no crackles, no rhonchi.  Fair air movement.  Speaking in full sentences.  Respiratory effort normal. Cardiovascular system: Regular rate rhythm no murmurs rubs or gallops.  No JVD.  No lower extremity edema.  Gastrointestinal system: Abdomen is soft, nontender, nondistended, positive bowel sounds.  No rebound.  No guarding.  Central nervous system: Alert and oriented.  Slow processing speed, upgaze paresis, oriented to person place and time.  No focal neurological deficits. Extremities: Symmetric 5 x 5 power. Skin: No rashes, lesions or ulcers Psychiatry: Judgement and insight appear poor to fair. Mood & affect appropriate.     Data Reviewed: I have personally reviewed following labs and imaging studies  CBC: Recent Labs  Lab 08/21/23 1648 08/22/23 0510 08/23/23 0509 08/24/23 0536  WBC 12.2* 9.2 8.6 7.6  NEUTROABS 9.9* 7.3 6.5  --   HGB 11.0* 8.4* 8.5* 8.6*  HCT 35.9* 27.1* 27.6* 27.7*  MCV 96.5 95.4 95.2 97.5  PLT 174 168 184 182    Basic Metabolic Panel: Recent Labs  Lab 08/21/23 1648 08/22/23 0510 08/23/23 0509 08/24/23 0536  NA 143 143 141 143  K 4.2 3.7 3.9 3.8  CL 108 111 108 112*  CO2 23 24 26 22   GLUCOSE 199* 248* 186* 152*  BUN 47* 39* 39* 28*  CREATININE 1.62* 1.02* 1.42* 1.03*  CALCIUM 9.5 8.6* 8.6* 8.4*  MG  --  1.9  --   --   PHOS  --  2.5  --   --      GFR: Estimated Creatinine Clearance: 42.1 mL/min (A) (by C-G formula based on SCr of 1.03 mg/dL (H)).  Liver Function Tests: Recent Labs  Lab 08/21/23 1648 08/22/23 0510 08/23/23 0509 08/24/23 0536  AST 114* 73* 57* 42*  ALT 8 21 12 8   ALKPHOS 50 38 43 38  BILITOT 1.1 1.1 1.0 0.9  PROT 7.5 5.9* 6.0* 5.8*  ALBUMIN 4.1 3.1* 3.1* 2.9*    CBG: Recent Labs  Lab 08/23/23 0751 08/23/23 1240 08/23/23 1746 08/23/23 2044 08/24/23 0727  GLUCAP 165* 104* 107* 128* 170*     Recent Results (from the past 240 hour(s))  Resp panel by RT-PCR (RSV, Flu A&B, Covid) Anterior Nasal Swab     Status: None   Collection Time: 08/21/23  5:30 PM   Specimen: Anterior Nasal Swab  Result Value Ref Range Status   SARS Coronavirus 2 by  RT PCR NEGATIVE NEGATIVE Final    Comment: (NOTE) SARS-CoV-2 target nucleic acids are NOT DETECTED.  The SARS-CoV-2 RNA is generally detectable in upper respiratory specimens during the acute phase of infection. The lowest concentration of SARS-CoV-2 viral copies this assay can detect is 138 copies/mL. A negative result does not preclude SARS-Cov-2 infection and should not be used as the sole basis for treatment or other patient management decisions. A negative result may occur with  improper specimen collection/handling, submission of specimen other than nasopharyngeal swab, presence of viral mutation(s) within the areas targeted by this assay, and inadequate number of viral copies(<138 copies/mL). A negative result must be combined with clinical observations, patient history, and epidemiological information. The expected result is Negative.  Fact Sheet for Patients:  BloggerCourse.com  Fact Sheet for Healthcare Providers:  SeriousBroker.it  This test is no t yet approved or cleared by the [place] FDA and  has been authorized for detection and/or diagnosis of SARS-CoV-2 by FDA under an Emergency  Use Authorization (EUA). This EUA will remain  in effect (meaning this test can be used) for the duration of the COVID-19 declaration under Section 564(b)(1) of the Act, 21 U.S.C.section 360bbb-3(b)(1), unless the authorization is terminated  or revoked sooner.       Influenza A by PCR NEGATIVE NEGATIVE Final   Influenza B by PCR NEGATIVE NEGATIVE Final    Comment: (NOTE) The Xpert Xpress SARS-CoV-2/FLU/RSV plus assay is intended as an aid in the diagnosis of influenza from Nasopharyngeal swab specimens and should not be used as a sole basis for treatment. Nasal washings and aspirates are unacceptable for Xpert Xpress SARS-CoV-2/FLU/RSV testing.  Fact Sheet for Patients: BloggerCourse.com  Fact Sheet for Healthcare Providers: SeriousBroker.it  This test is not yet approved or cleared by the [place] FDA and has been authorized for detection and/or diagnosis of SARS-CoV-2 by FDA under an Emergency Use Authorization (EUA). This EUA will remain in effect (meaning this test can be used) for the duration of the COVID-19 declaration under Section 564(b)(1) of the Act, 21 U.S.C. section 360bbb-3(b)(1), unless the authorization is terminated or revoked.     Resp Syncytial Virus by PCR NEGATIVE NEGATIVE Final    Comment: (NOTE) Fact Sheet for Patients: BloggerCourse.com  Fact Sheet for Healthcare Providers: SeriousBroker.it  This test is not yet approved or cleared by the [place] FDA and has been authorized for detection and/or diagnosis of SARS-CoV-2 by FDA under an Emergency Use Authorization (EUA). This EUA will remain in effect (meaning this test can be used) for the duration of the COVID-19 declaration under Section 564(b)(1) of the Act, 21 U.S.C. section 360bbb-3(b)(1), unless the authorization is terminated or revoked.  Performed at [facility],  [address] [place], [place] [address]          Radiology Studies: US Abdomen Complete  Result Date: 08/23/2023 CLINICAL DATA:  Transaminitis EXAM: ABDOMEN ULTRASOUND COMPLETE COMPARISON:  MRI 02/10/2023. FINDINGS: Gallbladder: Distended gallbladder. No shadowing stones. No wall thickening or adjacent fluid. Common bile duct: Diameter: 4 mm Liver: Mild intrahepatic biliary duct ectasia. Similar to prior MRI in retrospect. Portal vein is patent on color Doppler imaging with normal direction of blood flow towards the liver. IVC: No abnormality visualized. Pancreas: Mild pancreatic duct ectasia of 3 mm. Cystic areas in the pancreas as well measuring up to 14 by 10 x 17 mm. Spleen: Size and appearance within normal limits. Right Kidney: Length: 10.6 cm. No collecting system dilatation. Benign-appearing cysts,  anechoic with through transmission and smooth thin walls. Largest measures 5.4 cm. Left Kidney: Length: 10.4 cm. Benign small appearing cyst measuring 14 mm. There is heterogeneous hypoechoic area identified measuring 3.3 cm. Please correlate with history of prior mass ablation Abdominal aorta: No aneurysm visualized. Other findings: None. IMPRESSION: No gallstones. The common duct is nondilated but there is some stable mild intrahepatic biliary duct ectasia going back to the previous MRI. 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 Please correlate with the prior MRI recommendations. Bilateral renal cysts. Heterogeneous lesion in the left kidney towards the lower pole again seen consistent with the areas of prior RF ablation. Standard post ablation follow up recommended Electronically Signed   By: Karen Kays M.D.   On: 08/23/2023 11:36        Scheduled Meds:  amLODipine  2.5 mg Oral Daily   aspirin EC  81 mg Oral QHS   atorvastatin  40 mg Oral QHS   carbidopa-levodopa  2 tablet Oral TID   insulin aspart  0-15 Units Subcutaneous TID WC   insulin aspart  0-5  Units Subcutaneous QHS   insulin glargine-yfgn  5 Units Subcutaneous QHS   leptospermum manuka honey  1 Application Topical Daily   levothyroxine  75 mcg Oral Q0600   Continuous Infusions:  sodium chloride 100 mL/hr at 08/24/23 0938     LOS: 3 days    Time spent: 35 minutes    Ramiro Harvest, MD [facility]   To contact the attending provider between 7A-7P or the covering provider during after hours 7P-7A, please log into the web site [email or web address] and access using universal [place] password for that web site. If you do not have the password, please call the hospital operator.  08/24/2023, 10:05 AM

## 2023-08-25 DIAGNOSIS — R531 Weakness: Secondary | ICD-10-CM | POA: Diagnosis not present

## 2023-08-25 DIAGNOSIS — T796XXS Traumatic ischemia of muscle, sequela: Secondary | ICD-10-CM | POA: Diagnosis not present

## 2023-08-25 DIAGNOSIS — N179 Acute kidney failure, unspecified: Secondary | ICD-10-CM | POA: Diagnosis not present

## 2023-08-25 DIAGNOSIS — S31000D Unspecified open wound of lower back and pelvis without penetration into retroperitoneum, subsequent encounter: Secondary | ICD-10-CM | POA: Diagnosis not present

## 2023-08-25 LAB — CBC
HCT: 30.6 % — ABNORMAL LOW (ref 36.0–46.0)
Hemoglobin: 9.7 g/dL — ABNORMAL LOW (ref 12.0–15.0)
MCH: 30.8 pg (ref 26.0–34.0)
MCHC: 31.7 g/dL (ref 30.0–36.0)
MCV: 97.1 fL (ref 80.0–100.0)
Platelets: 198 10*3/uL (ref 150–400)
RBC: 3.15 MIL/uL — ABNORMAL LOW (ref 3.87–5.11)
RDW: 15 % (ref 11.5–15.5)
WBC: 8.7 10*3/uL (ref 4.0–10.5)
nRBC: 0 % (ref 0.0–0.2)

## 2023-08-25 LAB — COMPREHENSIVE METABOLIC PANEL
ALT: 8 U/L (ref 0–44)
AST: 32 U/L (ref 15–41)
Albumin: 3 g/dL — ABNORMAL LOW (ref 3.5–5.0)
Alkaline Phosphatase: 45 U/L (ref 38–126)
Anion gap: 7 (ref 5–15)
BUN: 21 mg/dL (ref 8–23)
CO2: 23 mmol/L (ref 22–32)
Calcium: 8.5 mg/dL — ABNORMAL LOW (ref 8.9–10.3)
Chloride: 110 mmol/L (ref 98–111)
Creatinine, Ser: 1.05 mg/dL — ABNORMAL HIGH (ref 0.44–1.00)
GFR, Estimated: 54 mL/min — ABNORMAL LOW (ref >60)
Glucose, Bld: 142 mg/dL — ABNORMAL HIGH (ref 70–99)
Potassium: 4 mmol/L (ref 3.5–5.1)
Sodium: 140 mmol/L (ref 135–145)
Total Bilirubin: 0.7 mg/dL (ref <1.2)
Total Protein: 5.9 g/dL — ABNORMAL LOW (ref 6.5–8.1)

## 2023-08-25 LAB — GLUCOSE, CAPILLARY
Glucose-Capillary: 148 mg/dL — ABNORMAL HIGH (ref 70–99)
Glucose-Capillary: 113 mg/dL — ABNORMAL HIGH (ref 70–99)
Glucose-Capillary: 130 mg/dL — ABNORMAL HIGH (ref 70–99)
Glucose-Capillary: 134 mg/dL — ABNORMAL HIGH (ref 70–99)

## 2023-08-25 LAB — CK: Total CK: 489 U/L — ABNORMAL HIGH (ref 38–234)

## 2023-08-25 MED ORDER — AMLODIPINE BESYLATE 5 MG PO TABS
5.0000 mg | ORAL_TABLET | Freq: Every day | ORAL | Status: DC
  Administered 2023-08-25 – 2023-08-29 (×5): 5 mg via ORAL
  Filled 2023-08-25 (×5): qty 1

## 2023-08-25 NOTE — Consult Note (Signed)
[facility] Liaison Consult Note    08/25/2023  JAQUASIA SMEE [birth year] [id number]  *[facility] Institute [VBCI] remote coverage review for patient admitted to [facility] Call attempts to bedside without success.  Primary Care Provider:  Judieth Keens, DO is with [facility] per EPIC, however, patient was listed with [place] [facility] with Nira Conn, MD this provider is listed to provide the community transition of care follow up and [facility] calls  Insurance: [facility]   Patient is currently active with [facility] for care coordination services.  Patient has been engaged by a Nurse, children's Complex CM team.  The community based plan of care has focused on disease management and community resource support.     Patient is currently for SNF per recommendations and progress notes.  Plan: Continue to follow for any additional community care coordination needs for post hospital/community needs. If patient goes to a SNF will update VBCI Complex Team of disposition.   Of note, [facility] services does not replace or interfere with any services that are needed or arranged by inpatient [facility] care management team.   Charlesetta Shanks, RN, BSN, CCM [place]  [facility] Liaison Direct Dial: [phone] or secure chat Email: [email or web address]

## 2023-08-25 NOTE — Progress Notes (Signed)
PROGRESS NOTE    STEPHANI FACCHINI  JXB:[id number] DOB: 11-29-1945 DOA: 08/21/2023 PCP: Judieth Keens, DO    Chief Complaint  Patient presents with   Fall   Weakness    Brief Narrative:  Pamela Patton is a 77 y.o. F with Parkinson's disease, DM, HTN, and HLD who presented with several falls this week, finally too weak to stand.   In the ER, CK 5084, Cr 1.6 from baseline 1.2.  UA normal, CXR clear, COVID-.  Admitted on IV fluids for falls and rhabdo.   Assessment & Plan:   Principal Problem:   Generalized weakness Active Problems:   Rhabdomyolysis   Elevated troponin   Dehydration   Anemia of chronic disease   Acquired hypothyroidism   Parkinson's disease (HCC)   Essential hypertension   Hyperlipidemia   Falls   Sacral wound   AKI (acute kidney injury) (HCC)   DM2 (diabetes mellitus, type 2) (HCC)  #1 rhabdomyolysis/generalized weakness/falls/dehydration -Secondary to falls from Parkinson's compounded by [name] and dehydration while being down on the ground for prolonged periods several times. -Less likely to be other metabolic cause of weakness. -CK on admission noted at 5084 trending down currently at 489. -LFTs trending down. -Renal function with creatinine currently at 1.05 from 1.03 from 1.42 was 1.62 on admission. -Vitamin B12 531 -Ammonia level of 33. -RPR nonreactive. -Influenza PCR negative.  Respiratory syncytial virus by PCR negative.  SARS coronavirus 2 PCR negative. -Continue gentle hydration with IV fluids for another 24 hours, follow CK levels.   -PT/OT.    2.  Anemia of chronic disease -Hemoglobin currently at 9.7 from 8.6 from 8.5 from 11.0 on admission likely dilutional and hemoglobin of 11.0 likely more secondary to dehydration. -Patient with no overt bleeding. -Anemia panel with iron level of 27, TIBC of 244, ferritin of 93. -Follow H&H.  3.  Diabetes mellitus type 2 with hyperglycemia -Hemoglobin A1c 7.8 (04/27/2023) -CBG 148 this  morning. -Continue to hold home regimen Actos. -Continue Semglee 5 units daily, SSI.  4.  AKI -Creatinine noted at 1.6 on admission was improving initially trended back up to 1.42 and trended back down to 1.05 today with gentle hydration. -Continue to hold losartan.  5.  Transaminitis -Likely secondary to rhabdomyolysis. -LFTs trending down. 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  Bilateral renal cysts.  Heterogeneous lesion in the left kidney towards the lower pole again seen consistent with areas of prior RF ablation. -Follow.  6.  Hyperlipidemia -Statin discontinued as patient had presented with rhabdomyolysis and would likely resume 1 to 2 weeks postdischarge.    7.  Hypertension -Continue to hold HCTZ secondary to presentation with dehydration and AKI. -Cozaar on hold. -Increase Norvasc to 5 mg daily.   8.  Hypothyroidism -Continue Synthroid.    9.  Sacral wound -Felt secondary during one of her falls. -Wound care RN consulted.  #10 Parkinson's disease -Continue Sinemet     DVT prophylaxis: SCDs Code Status: Full Family Communication: Updated patient.  No family at bedside.  He Disposition: Medically stable.  SNF when bed available.  Status is: Inpatient Remains inpatient appropriate because: Severity of illness   Consultants:  None  Procedures:  CT head 08/21/2023 CT C-spine 08/21/2023 Chest x-ray 08/21/2023 Plain films of bilateral knees 08/21/2023 Plain films the pelvis 08/21/2023 Abdominal ultrasound 08/23/2023 2  d echo 08/22/2023  Antimicrobials:  Anti-infectives (From admission, onward)    None         Subjective: Patient sitting up in recliner.  States she ate a blueberry muffin and milk for breakfast.  Stated she ambulated a little bit in the hallway with  therapy.  Denies any chest pain or shortness of breath.  No abdominal pain.  Overall feels well.    Objective: Vitals:   08/25/23 0447 08/25/23 0500 08/25/23 0756 08/25/23 1009  BP: (!) 143/58  (!) 163/62 (!) 134/59  Pulse: (!) 54  (!) 56   Resp: 16  18   Temp: 98 F (36.7 C)  98.7 F (37.1 C)   TempSrc: Oral  Oral   SpO2: 97%  98%   Weight:  62.5 kg    Height:        Intake/Output Summary (Last 24 hours) at 08/25/2023 1204 Last data filed at 08/25/2023 0530 Gross per 24 hour  Intake 120 ml  Output 1400 ml  Net -1280 ml   Filed Weights   08/23/23 0500 08/24/23 0447 08/25/23 0500  Weight: 60.1 kg 62.6 kg 62.5 kg    Examination:  General exam: NAD. Respiratory system: Lungs clear to auscultation bilaterally.  No wheezes, no crackles, no rhonchi.  Fair air movement.  Speaking in full sentences.  Cardiovascular system: RRR no murmurs rubs or gallops.  No JVD.  No lower extremity edema.  Gastrointestinal system: Abdomen is soft, nontender, nondistended, positive bowel sounds.  No rebound.  No guarding.   Central nervous system: Alert and oriented.  Slow processing speed, upgaze paresis, oriented to person place and time.  No focal neurological deficits. Extremities: Symmetric 5 x 5 power. Skin: No rashes, lesions or ulcers Psychiatry: Judgement and insight appear poor to fair. Mood & affect appropriate.     Data Reviewed: I have personally reviewed following labs and imaging studies  CBC: Recent Labs  Lab 08/21/23 1648 08/22/23 0510 08/23/23 0509 08/24/23 0536 08/25/23 0529  WBC 12.2* 9.2 8.6 7.6 8.7  NEUTROABS 9.9* 7.3 6.5  --   --   HGB 11.0* 8.4* 8.5* 8.6* 9.7*  HCT 35.9* 27.1* 27.6* 27.7* 30.6*  MCV 96.5 95.4 95.2 97.5 97.1  PLT 174 168 184 182 198    Basic Metabolic Panel: Recent Labs  Lab 08/21/23 1648 08/22/23 0510 08/23/23 0509 08/24/23 0536 08/25/23 0529  NA 143 143 141 143 140  K 4.2 3.7 3.9 3.8 4.0  CL 108 111 108 112* 110  CO2 23 24 26 22  23   GLUCOSE 199* 248* 186* 152* 142*  BUN 47* 39* 39* 28* 21  CREATININE 1.62* 1.02* 1.42* 1.03* 1.05*  CALCIUM 9.5 8.6* 8.6* 8.4* 8.5*  MG  --  1.9  --   --   --   PHOS  --  2.5  --   --   --     GFR: Estimated Creatinine Clearance: 41.3 mL/min (A) (by C-G formula based on SCr of 1.05 mg/dL (H)).  Liver Function Tests: Recent Labs  Lab 08/21/23 1648 08/22/23 0510 08/23/23 0509 08/24/23 0536 08/25/23 0529  AST 114* 73* 57* 42* 32  ALT 8 21 12 8 8   ALKPHOS 50 38 43 38 45  BILITOT 1.1 1.1 1.0 0.9 0.7  PROT 7.5 5.9* 6.0* 5.8* 5.9*  ALBUMIN 4.1 3.1* 3.1* 2.9* 3.0*    CBG: Recent Labs  Lab 08/24/23 1142 08/24/23 1641 08/24/23 2046 08/25/23 0752 08/25/23 1152  GLUCAP 240* 111* 112* 148* 113*  Recent Results (from the past 240 hour(s))  Resp panel by RT-PCR (RSV, Flu A&B, Covid) Anterior Nasal Swab     Status: None   Collection Time: 08/21/23  5:30 PM   Specimen: Anterior Nasal Swab  Result Value Ref Range Status   SARS Coronavirus 2 by RT PCR NEGATIVE NEGATIVE Final    Comment: (NOTE) SARS-CoV-2 target nucleic acids are NOT DETECTED.  The SARS-CoV-2 RNA is generally detectable in upper respiratory specimens during the acute phase of infection. The lowest concentration of SARS-CoV-2 viral copies this assay can detect is 138 copies/mL. A negative result does not preclude SARS-Cov-2 infection and should not be used as the sole basis for treatment or other patient management decisions. A negative result may occur with  improper specimen collection/handling, submission of specimen other than nasopharyngeal swab, presence of viral mutation(s) within the areas targeted by this assay, and inadequate number of viral copies(<138 copies/mL). A negative result must be combined with clinical observations, patient history, and epidemiological information. The expected result is Negative.  Fact Sheet for Patients:  BloggerCourse.com  Fact Sheet  for Healthcare Providers:  SeriousBroker.it  This test is no t yet approved or cleared by the [place] FDA and  has been authorized for detection and/or diagnosis of SARS-CoV-2 by FDA under an Emergency Use Authorization (EUA). This EUA will remain  in effect (meaning this test can be used) for the duration of the COVID-19 declaration under Section 564(b)(1) of the Act, 21 U.S.C.section 360bbb-3(b)(1), unless the authorization is terminated  or revoked sooner.       Influenza A by PCR NEGATIVE NEGATIVE Final   Influenza B by PCR NEGATIVE NEGATIVE Final    Comment: (NOTE) The Xpert Xpress SARS-CoV-2/FLU/RSV plus assay is intended as an aid in the diagnosis of influenza from Nasopharyngeal swab specimens and should not be used as a sole basis for treatment. Nasal washings and aspirates are unacceptable for Xpert Xpress SARS-CoV-2/FLU/RSV testing.  Fact Sheet for Patients: BloggerCourse.com  Fact Sheet for Healthcare Providers: SeriousBroker.it  This test is not yet approved or cleared by the [place] FDA and has been authorized for detection and/or diagnosis of SARS-CoV-2 by FDA under an Emergency Use Authorization (EUA). This EUA will remain in effect (meaning this test can be used) for the duration of the COVID-19 declaration under Section 564(b)(1) of the Act, 21 U.S.C. section 360bbb-3(b)(1), unless the authorization is terminated or revoked.     Resp Syncytial Virus by PCR NEGATIVE NEGATIVE Final    Comment: (NOTE) Fact Sheet for Patients: BloggerCourse.com  Fact Sheet for Healthcare Providers: SeriousBroker.it  This test is not yet approved or cleared by the [place] FDA and has been authorized for detection and/or diagnosis of SARS-CoV-2 by FDA under an Emergency Use Authorization (EUA). This EUA will remain in effect (meaning  this test can be used) for the duration of the COVID-19 declaration under Section 564(b)(1) of the Act, 21 U.S.C. section 360bbb-3(b)(1), unless the authorization is terminated or revoked.  Performed at [facility] [address] [place], [place] [address]          Radiology Studies: No results found.      Scheduled Meds:  amLODipine  5 mg Oral Daily   aspirin EC  81 mg Oral QHS   carbidopa-levodopa  2 tablet Oral TID   insulin aspart  0-15 Units Subcutaneous TID WC   insulin aspart  0-5 Units Subcutaneous QHS   insulin glargine-yfgn  5 Units Subcutaneous QHS  leptospermum manuka honey  1 Application Topical Daily   levothyroxine  75 mcg Oral Q0600   Continuous Infusions:  sodium chloride 75 mL/hr at 08/25/23 1008     LOS: 4 days    Time spent: 35 minutes    Ramiro Harvest, MD [facility]   To contact the attending provider between 7A-7P or the covering provider during after hours 7P-7A, please log into the web site [email or web address] and access using universal [place] password for that web site. If you do not have the password, please call the hospital operator.  08/25/2023, 12:04 PM

## 2023-08-25 NOTE — Progress Notes (Signed)
Physical Therapy Treatment Patient Details Name: Pamela Patton MRN: [id number] DOB: 1946/02/25 Today's Date: 08/25/2023   History of Present Illness Pt is a 77 y.o. female admitted with generalized weakness and three recent falls. PMH sig for Parkinson's disease, Type II DM, HTN, hyperlipidemia, acquired hypothyroidism, anemia of chronic disease (associated baseline hemoglobin 9-11)    PT Comments  General Comments: AxO x 2 pleasant Lady and willing but she thought it was later in the day and thought today was Saturday.  Following all commands.  Pt stated she takes her Parkinson's medication 3 times a day. Assisted OOB was difficult.  General bed mobility comments: required increased assist this session due to rigidity.  Utilized bed pad to complete scooting to EOB. General transfer comment: Required increased assist this session due to rigity and posterior lean. General Gait Details: required increased assist this session due to posterior lean and gait instability.  Max VC's to increase step length to correct shuffled, festination gait.  Unsteady gait.  HIGH FALL Risk. Pt will need ST Rehab at SNF to address mobility and functional decline prior to safely returning home.    If plan is discharge home, recommend the following: A little help with walking and/or transfers;A little help with bathing/dressing/bathroom;Assistance with cooking/housework;Assist for transportation;Help with stairs or ramp for entrance   Can travel by private vehicle        Equipment Recommendations  Rolling walker (2 wheels)    Recommendations for Other Services       Precautions / Restrictions Precautions Precautions: Fall Precaution Comments: Hx Parkinsons Restrictions Weight Bearing Restrictions: No     Mobility  Bed Mobility Overal bed mobility: Needs Assistance Bed Mobility: Supine to Sit       Sit to supine: Max assist   General bed mobility comments: required increased assist this session due  to rigidity.  Utilized bed pad to complete scooting to EOB.    Transfers Overall transfer level: Needs assistance Equipment used: Rolling walker (2 wheels) Transfers: Sit to/from Stand Sit to Stand: Max assist           General transfer comment: Required increased assist this session due to rigity and posterior lean.    Ambulation/Gait Ambulation/Gait assistance: Max assist Gait Distance (Feet): 25 Feet Assistive device: Rolling walker (2 wheels) Gait Pattern/deviations: Step-through pattern, Leaning posteriorly, Shuffle, Ataxic Gait velocity: decr     General Gait Details: required increased assist this session due to posterior lean and gait instability.  Max VC's to increase step length to correct shuffled, festination gait.  Unsteady gait.  HIGH FALL Risk.   Stairs             Wheelchair Mobility     Tilt Bed    Modified Rankin (Stroke Patients Only)       Balance                                            Cognition Arousal: Alert Behavior During Therapy: WFL for tasks assessed/performed Overall Cognitive Status: Within Functional Limits for tasks assessed                                 General Comments: AxO x 2 pleasant Lady and willing but she thought it was later in the day and thought today was Saturday.  Following  all commands.  Pt stated she takes her Parkinson's medication 3 times a day.        Exercises      General Comments        Pertinent Vitals/Pain Pain Assessment Pain Assessment: No/denies pain    Home Living                          Prior Function            PT Goals (current goals can now be found in the care plan section) Progress towards PT goals: Progressing toward goals    Frequency    Min 1X/week      PT Plan      Co-evaluation              AM-PAC PT "6 Clicks" Mobility   Outcome Measure  Help needed turning from your back to your side while in a flat  bed without using bedrails?: A Lot Help needed moving from lying on your back to sitting on the side of a flat bed without using bedrails?: A Lot Help needed moving to and from a bed to a chair (including a wheelchair)?: A Lot Help needed standing up from a chair using your arms (e.g., wheelchair or bedside chair)?: A Lot Help needed to walk in hospital room?: A Lot Help needed climbing 3-5 steps with a railing? : Total 6 Click Score: 11    End of Session Equipment Utilized During Treatment: Gait belt Activity Tolerance: Patient tolerated treatment well Patient left: in chair;with call bell/phone within reach;with chair alarm set Nurse Communication: Mobility status PT Visit Diagnosis: Unsteadiness on feet (R26.81);Difficulty in walking, not elsewhere classified (R26.2);Other symptoms and signs involving the nervous system (R29.898);Pain Pain - Right/Left: Right Pain - part of body: Knee     Time: 1012-1030 PT Time Calculation (min) (ACUTE ONLY): 18 min  Charges:    $Gait Training: 8-22 mins PT General Charges $$ ACUTE PT VISIT: 1 Visit                     {Barnet Benavides  PTA Acute  Rehabilitation Services Office M-F          [phone]

## 2023-08-25 NOTE — TOC Progression Note (Signed)
Transition of Care [facility]) - Progression Note    Patient Details  Name: Pamela Patton MRN: [id number] Date of Birth: [birth year]  Transition of [facility]) CM/SW Contact  Jenica Costilow, Olegario Messier, RN Phone Number: 08/25/2023, 12:23 PM  Clinical Narrative:  [name] Monday to [name] about  ST SNF bed offers, list will in rm also-await choice prior auth.    Expected Discharge Plan: Skilled Nursing Facility Barriers to Discharge: Continued Medical Work up  Expected Discharge Plan and Services                                               Social Determinants of Health (SDOH) Interventions SDOH Screenings   Food Insecurity: No Food Insecurity (08/21/2023)  Housing: Low Risk  (08/21/2023)  Transportation Needs: No Transportation Needs (08/21/2023)  Utilities: Not At Risk (08/21/2023)  Alcohol Screen: Low Risk  (12/29/2021)  Depression (PHQ2-9): Low Risk  (01/02/2023)  Financial Resource Strain: Low Risk  (06/01/2023)  Physical Activity: Inactive (01/02/2023)  Social Connections: Unknown (06/01/2023)  Stress: Stress Concern Present (06/01/2023)  Tobacco Use: Low Risk  (08/21/2023)    Readmission Risk Interventions     No data to display

## 2023-08-26 DIAGNOSIS — S31000D Unspecified open wound of lower back and pelvis without penetration into retroperitoneum, subsequent encounter: Secondary | ICD-10-CM | POA: Diagnosis not present

## 2023-08-26 DIAGNOSIS — T796XXS Traumatic ischemia of muscle, sequela: Secondary | ICD-10-CM | POA: Diagnosis not present

## 2023-08-26 DIAGNOSIS — N179 Acute kidney failure, unspecified: Secondary | ICD-10-CM | POA: Diagnosis not present

## 2023-08-26 DIAGNOSIS — R531 Weakness: Secondary | ICD-10-CM | POA: Diagnosis not present

## 2023-08-26 LAB — BASIC METABOLIC PANEL
Anion gap: 6 (ref 5–15)
BUN: 18 mg/dL (ref 8–23)
CO2: 25 mmol/L (ref 22–32)
Calcium: 8.4 mg/dL — ABNORMAL LOW (ref 8.9–10.3)
Chloride: 106 mmol/L (ref 98–111)
Creatinine, Ser: 1.04 mg/dL — ABNORMAL HIGH (ref 0.44–1.00)
GFR, Estimated: 55 mL/min — ABNORMAL LOW (ref >60)
Glucose, Bld: 84 mg/dL (ref 70–99)
Potassium: 3.7 mmol/L (ref 3.5–5.1)
Sodium: 137 mmol/L (ref 135–145)

## 2023-08-26 LAB — GLUCOSE, CAPILLARY
Glucose-Capillary: 68 mg/dL — ABNORMAL LOW (ref 70–99)
Glucose-Capillary: 83 mg/dL (ref 70–99)
Glucose-Capillary: 110 mg/dL — ABNORMAL HIGH (ref 70–99)
Glucose-Capillary: 110 mg/dL — ABNORMAL HIGH (ref 70–99)
Glucose-Capillary: 170 mg/dL — ABNORMAL HIGH (ref 70–99)

## 2023-08-26 LAB — HEMOGLOBIN AND HEMATOCRIT, BLOOD
HCT: 30 % — ABNORMAL LOW (ref 36.0–46.0)
Hemoglobin: 9.3 g/dL — ABNORMAL LOW (ref 12.0–15.0)

## 2023-08-26 LAB — CK: Total CK: 291 U/L — ABNORMAL HIGH (ref 38–234)

## 2023-08-26 NOTE — Plan of Care (Signed)
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
## 2023-08-26 NOTE — TOC Progression Note (Signed)
Transition of Care [facility]) - Progression Note    Patient Details  Name: Pamela Patton MRN: [id number] Date of Birth: [birth year]  Transition of Care [facility]) CM/SW Contact  Adrian Prows, RN Phone Number: 08/26/2023, 3:21 PM  Clinical Narrative:    Notified pt and family would like to discuss SNF choice; spoke w/ them in room; son Lenon Curt says they have not yet made choice; will attempt to make contact tomorrow for choice.    Expected Discharge Plan: Skilled Nursing Facility Barriers to Discharge: Continued Medical Work up  Expected Discharge Plan and Services                                               Social Determinants of Health (SDOH) Interventions SDOH Screenings   Food Insecurity: No Food Insecurity (08/21/2023)  Housing: Low Risk  (08/21/2023)  Transportation Needs: No Transportation Needs (08/21/2023)  Utilities: Not At Risk (08/21/2023)  Alcohol Screen: Low Risk  (12/29/2021)  Depression (PHQ2-9): Low Risk  (01/02/2023)  Financial Resource Strain: Low Risk  (06/01/2023)  Physical Activity: Inactive (01/02/2023)  Social Connections: Unknown (06/01/2023)  Stress: Stress Concern Present (06/01/2023)  Tobacco Use: Low Risk  (08/21/2023)    Readmission Risk Interventions     No data to display

## 2023-08-26 NOTE — Progress Notes (Signed)
Mobility Specialist - Progress Note   08/26/23 1456  Mobility  Activity Transferred from bed to chair  Level of Assistance Moderate assist, patient does 50-74%  Assistive Device Front wheel walker  Distance Ambulated (ft) 3 ft  Range of Motion/Exercises Active Assistive  Activity Response Tolerated well  Mobility Referral Yes  $Mobility charge 1 Mobility  Mobility Specialist Start Time (ACUTE ONLY) 1445  Mobility Specialist Stop Time (ACUTE ONLY) 1456  Mobility Specialist Time Calculation (min) (ACUTE ONLY) 11 min   Pt was found in bed and agreeable to transfer to recliner chair. Pt able to do x10 marches in place once up from bed. Able to side step and followed cues. Pt was left on recliner chair with all needs met. Call bell in reach and family in room. RN notified.  Billey Chang Mobility Specialist

## 2023-08-26 NOTE — Plan of Care (Signed)
  Problem: Education: Goal: Ability to describe self-care measures that may prevent or decrease complications (Diabetes Survival Skills Education) will improve Outcome: Not Progressing   Problem: Health Behavior/Discharge Planning: Goal: Ability to manage health-related needs will improve Outcome: Not Progressing

## 2023-08-27 ENCOUNTER — Inpatient Hospital Stay (HOSPITAL_COMMUNITY): Payer: Medicare PPO

## 2023-08-27 DIAGNOSIS — S31000D Unspecified open wound of lower back and pelvis without penetration into retroperitoneum, subsequent encounter: Secondary | ICD-10-CM | POA: Diagnosis not present

## 2023-08-27 DIAGNOSIS — D72829 Elevated white blood cell count, unspecified: Secondary | ICD-10-CM | POA: Insufficient documentation

## 2023-08-27 DIAGNOSIS — N179 Acute kidney failure, unspecified: Secondary | ICD-10-CM | POA: Diagnosis not present

## 2023-08-27 DIAGNOSIS — T796XXS Traumatic ischemia of muscle, sequela: Secondary | ICD-10-CM | POA: Diagnosis not present

## 2023-08-27 DIAGNOSIS — R531 Weakness: Secondary | ICD-10-CM | POA: Diagnosis not present

## 2023-08-27 LAB — URINALYSIS, COMPLETE (UACMP) WITH MICROSCOPIC
Bacteria, UA: NONE SEEN
Bilirubin Urine: NEGATIVE
Glucose, UA: NEGATIVE mg/dL
Hgb urine dipstick: NEGATIVE
Ketones, ur: NEGATIVE mg/dL
Leukocytes,Ua: NEGATIVE
Nitrite: NEGATIVE
Protein, ur: NEGATIVE mg/dL
Specific Gravity, Urine: 1.01 (ref 1.005–1.030)
pH: 5 (ref 5.0–8.0)

## 2023-08-27 LAB — CBC
HCT: 29.4 % — ABNORMAL LOW (ref 36.0–46.0)
Hemoglobin: 9 g/dL — ABNORMAL LOW (ref 12.0–15.0)
MCH: 29.6 pg (ref 26.0–34.0)
MCHC: 30.6 g/dL (ref 30.0–36.0)
MCV: 96.7 fL (ref 80.0–100.0)
Platelets: 277 10*3/uL (ref 150–400)
RBC: 3.04 MIL/uL — ABNORMAL LOW (ref 3.87–5.11)
RDW: 14.9 % (ref 11.5–15.5)
WBC: 12.3 10*3/uL — ABNORMAL HIGH (ref 4.0–10.5)
nRBC: 0 % (ref 0.0–0.2)

## 2023-08-27 LAB — BASIC METABOLIC PANEL
Anion gap: 6 (ref 5–15)
BUN: 19 mg/dL (ref 8–23)
CO2: 23 mmol/L (ref 22–32)
Calcium: 8.2 mg/dL — ABNORMAL LOW (ref 8.9–10.3)
Chloride: 105 mmol/L (ref 98–111)
Creatinine, Ser: 1.17 mg/dL — ABNORMAL HIGH (ref 0.44–1.00)
GFR, Estimated: 48 mL/min — ABNORMAL LOW (ref >60)
Glucose, Bld: 123 mg/dL — ABNORMAL HIGH (ref 70–99)
Potassium: 3.8 mmol/L (ref 3.5–5.1)
Sodium: 134 mmol/L — ABNORMAL LOW (ref 135–145)

## 2023-08-27 LAB — GLUCOSE, CAPILLARY
Glucose-Capillary: 75 mg/dL (ref 70–99)
Glucose-Capillary: 112 mg/dL — ABNORMAL HIGH (ref 70–99)
Glucose-Capillary: 186 mg/dL — ABNORMAL HIGH (ref 70–99)
Glucose-Capillary: 197 mg/dL — ABNORMAL HIGH (ref 70–99)

## 2023-08-27 LAB — MAGNESIUM: Magnesium: 1.7 mg/dL (ref 1.7–2.4)

## 2023-08-27 MED ORDER — POLYETHYLENE GLYCOL 3350 17 G PO PACK
17.0000 g | PACK | Freq: Every day | ORAL | Status: DC
  Administered 2023-08-27 – 2023-08-28 (×2): 17 g via ORAL
  Filled 2023-08-27 (×2): qty 1

## 2023-08-27 MED ORDER — SENNOSIDES-DOCUSATE SODIUM 8.6-50 MG PO TABS
1.0000 | ORAL_TABLET | Freq: Every day | ORAL | Status: DC
  Administered 2023-08-27: 1 via ORAL
  Filled 2023-08-27: qty 1

## 2023-08-27 MED ORDER — MAGNESIUM SULFATE 2 GM/50ML IV SOLN
2.0000 g | Freq: Once | INTRAVENOUS | Status: AC
  Administered 2023-08-27: 2 g via INTRAVENOUS
  Filled 2023-08-27: qty 50

## 2023-08-27 NOTE — Plan of Care (Signed)
  Problem: Activity: Goal: Risk for activity intolerance will decrease Outcome: Progressing   

## 2023-08-27 NOTE — TOC Progression Note (Signed)
Transition of Care [facility]) - Progression Note    Patient Details  Name: Pamela Patton MRN: [id number] Date of Birth: Jul 12, 1946  [facility]) CM/SW Contact  Adrian Prows, RN Phone Number: 08/27/2023, 4:13 PM  Clinical Narrative:    Spoke w/ pt, son, and family in room; they have selected [facility]; will pass on to oncoming TOC to start ins auth.   Expected Discharge Plan: Skilled Nursing Facility Barriers to Discharge: Continued Medical Work up  Expected Discharge Plan and Services                                               Social Determinants of Health (SDOH) Interventions SDOH Screenings   Food Insecurity: No Food Insecurity (08/21/2023)  Housing: Low Risk  (08/21/2023)  Transportation Needs: No Transportation Needs (08/21/2023)  Utilities: Not At Risk (08/21/2023)  Alcohol Screen: Low Risk  (12/29/2021)  Depression (PHQ2-9): Low Risk  (01/02/2023)  Financial Resource Strain: Low Risk  (06/01/2023)  Physical Activity: Inactive (01/02/2023)  Social Connections: Unknown (06/01/2023)  Stress: Stress Concern Present (06/01/2023)  Tobacco Use: Low Risk  (08/21/2023)    Readmission Risk Interventions     No data to display
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
## 2023-08-27 NOTE — Plan of Care (Signed)

## 2023-08-27 NOTE — Progress Notes (Signed)
Mobility Specialist - Progress Note   08/27/23 1038  Mobility  Activity Transferred from bed to chair  Level of Assistance Minimal assist, patient does 75% or more  Assistive Device Front wheel walker  Distance Ambulated (ft) 2 ft  Activity Response Tolerated well  Mobility Referral Yes  $Mobility charge 1 Mobility  Mobility Specialist Start Time (ACUTE ONLY) 1018  Mobility Specialist Stop Time (ACUTE ONLY) 1037  Mobility Specialist Time Calculation (min) (ACUTE ONLY) 19 min   Pt received in bed and agreeable to transfer to recliner for breakfast. Pt did 10 seated marches prior to standing. Pt was modA from supine>sitting and STS.  Pt required verbal cues during transfer for foot & hand placement. No complaints during session. Pt to recliner after session with all needs met. Chair alarm on.   [facility]

## 2023-08-28 DIAGNOSIS — S31000D Unspecified open wound of lower back and pelvis without penetration into retroperitoneum, subsequent encounter: Secondary | ICD-10-CM | POA: Diagnosis not present

## 2023-08-28 DIAGNOSIS — T796XXS Traumatic ischemia of muscle, sequela: Secondary | ICD-10-CM | POA: Diagnosis not present

## 2023-08-28 DIAGNOSIS — N179 Acute kidney failure, unspecified: Secondary | ICD-10-CM | POA: Diagnosis not present

## 2023-08-28 DIAGNOSIS — R531 Weakness: Secondary | ICD-10-CM | POA: Diagnosis not present

## 2023-08-28 LAB — CBC WITH DIFFERENTIAL/PLATELET
Abs Immature Granulocytes: 0.06 10*3/uL (ref 0.00–0.07)
Basophils Absolute: 0 10*3/uL (ref 0.0–0.1)
Basophils Relative: 0 %
Eosinophils Absolute: 0.1 10*3/uL (ref 0.0–0.5)
Eosinophils Relative: 1 %
HCT: 26.1 % — ABNORMAL LOW (ref 36.0–46.0)
Hemoglobin: 8.2 g/dL — ABNORMAL LOW (ref 12.0–15.0)
Immature Granulocytes: 1 %
Lymphocytes Relative: 13 %
Lymphs Abs: 1.2 10*3/uL (ref 0.7–4.0)
MCH: 29.6 pg (ref 26.0–34.0)
MCHC: 31.4 g/dL (ref 30.0–36.0)
MCV: 94.2 fL (ref 80.0–100.0)
Monocytes Absolute: 0.9 10*3/uL (ref 0.1–1.0)
Monocytes Relative: 10 %
Neutro Abs: 7.4 10*3/uL (ref 1.7–7.7)
Neutrophils Relative %: 75 %
Platelets: 290 10*3/uL (ref 150–400)
RBC: 2.77 MIL/uL — ABNORMAL LOW (ref 3.87–5.11)
RDW: 14.9 % (ref 11.5–15.5)
WBC: 9.7 10*3/uL (ref 4.0–10.5)
nRBC: 0 % (ref 0.0–0.2)

## 2023-08-28 LAB — BASIC METABOLIC PANEL
Anion gap: 7 (ref 5–15)
BUN: 23 mg/dL (ref 8–23)
CO2: 25 mmol/L (ref 22–32)
Calcium: 8.4 mg/dL — ABNORMAL LOW (ref 8.9–10.3)
Chloride: 107 mmol/L (ref 98–111)
Creatinine, Ser: 1.32 mg/dL — ABNORMAL HIGH (ref 0.44–1.00)
GFR, Estimated: 41 mL/min — ABNORMAL LOW (ref >60)
Glucose, Bld: 154 mg/dL — ABNORMAL HIGH (ref 70–99)
Potassium: 4.1 mmol/L (ref 3.5–5.1)
Sodium: 139 mmol/L (ref 135–145)

## 2023-08-28 LAB — GLUCOSE, CAPILLARY
Glucose-Capillary: 113 mg/dL — ABNORMAL HIGH (ref 70–99)
Glucose-Capillary: 137 mg/dL — ABNORMAL HIGH (ref 70–99)
Glucose-Capillary: 99 mg/dL (ref 70–99)
Glucose-Capillary: 210 mg/dL — ABNORMAL HIGH (ref 70–99)

## 2023-08-28 LAB — MAGNESIUM: Magnesium: 2 mg/dL (ref 1.7–2.4)

## 2023-08-28 LAB — URINE CULTURE: Culture: NO GROWTH

## 2023-08-28 MED ORDER — POLYETHYLENE GLYCOL 3350 17 G PO PACK
17.0000 g | PACK | Freq: Two times a day (BID) | ORAL | Status: DC
  Administered 2023-08-28 – 2023-08-29 (×2): 17 g via ORAL
  Filled 2023-08-28 (×2): qty 1

## 2023-08-28 MED ORDER — ORAL CARE MOUTH RINSE: 15.0000 mL | OROMUCOSAL | Status: DC | PRN

## 2023-08-28 MED ORDER — SENNOSIDES-DOCUSATE SODIUM 8.6-50 MG PO TABS
1.0000 | ORAL_TABLET | Freq: Two times a day (BID) | ORAL | Status: DC
  Administered 2023-08-28 – 2023-08-29 (×2): 1 via ORAL
  Filled 2023-08-28 (×2): qty 1

## 2023-08-28 MED ORDER — SORBITOL 70 % SOLN
30.0000 mL | Freq: Once | Status: AC
  Administered 2023-08-28: 30 mL via ORAL
  Filled 2023-08-28: qty 30

## 2023-08-28 NOTE — Plan of Care (Signed)
  Problem: Nutritional: Goal: Maintenance of adequate nutrition will improve Outcome: Progressing   Problem: Education: Goal: Knowledge of General Education information will improve Description: Including pain rating scale, medication(s)/side effects and non-pharmacologic comfort measures Outcome: Progressing   Problem: Clinical Measurements: Goal: Will remain free from infection Outcome: Progressing   Problem: Pain Management: Goal: General experience of comfort will improve Outcome: Progressing   Problem: Safety: Goal: Ability to remain free from injury will improve Outcome: Progressing
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
## 2023-08-28 NOTE — Progress Notes (Signed)
Physical Therapy Treatment Patient Details Name: Pamela Patton MRN: [id number] DOB: 1946-01-05 Today's Date: 08/28/2023   History of Present Illness Pt is a 77 y.o. female admitted with generalized weakness and three recent falls. PMH sig for Parkinson's disease, Type II DM, HTN, hyperlipidemia, acquired hypothyroidism, anemia of chronic disease (associated baseline hemoglobin 9-11)    PT Comments  General Comments: AxO x 2 pleasant Lady, cooperative.  Able to express needs "go to the bathroom".  Assisted OOB was difficult.  General bed mobility comments: required increased assist this session due to rigidity.  Utilized bed pad to complete scooting to EOB. General transfer comment: Required increased assist this session due to rigity and posterior lean.  Assisted on/off BSC aw well as assist for peri care due to balance instability. General Gait Details: required increased assist this session due to posterior lean and gait instability.  Max VC's to increase step length to correct shuffled, festination gait.  Unsteady gait.  HIGH FALL Risk. Pt will need ST Rehab at SNF to address mobility and functional decline prior to safely returning home.    If plan is discharge home, recommend the following: A little help with walking and/or transfers;A little help with bathing/dressing/bathroom;Assistance with cooking/housework;Assist for transportation;Help with stairs or ramp for entrance   Can travel by private vehicle     Yes  Equipment Recommendations  Rolling walker (2 wheels)    Recommendations for Other Services       Precautions / Restrictions Precautions Precautions: Fall Precaution Comments: Hx Parkinsons Restrictions Weight Bearing Restrictions: No     Mobility  Bed Mobility Overal bed mobility: Needs Assistance Bed Mobility: Supine to Sit     Supine to sit: Mod assist, Max assist     General bed mobility comments: required increased assist this session due to rigidity.   Utilized bed pad to complete scooting to EOB.    Transfers Overall transfer level: Needs assistance Equipment used: Rolling walker (2 wheels) Transfers: Sit to/from Stand Sit to Stand: Max assist           General transfer comment: Required increased assist this session due to rigity and posterior lean.  Assisted on/off BSC aw well as assist for peri care due to balance instability.    Ambulation/Gait Ambulation/Gait assistance: Max assist, Mod assist Gait Distance (Feet): 36 Feet Assistive device: Rolling walker (2 wheels) Gait Pattern/deviations: Step-through pattern, Leaning posteriorly, Shuffle, Ataxic Gait velocity: decr     General Gait Details: required increased assist this session due to posterior lean and gait instability.  Max VC's to increase step length to correct shuffled, festination gait.  Unsteady gait.  HIGH FALL Risk.   Stairs             Wheelchair Mobility     Tilt Bed    Modified Rankin (Stroke Patients Only)       Balance                                            Cognition Arousal: Alert Behavior During Therapy: WFL for tasks assessed/performed Overall Cognitive Status: Within Functional Limits for tasks assessed                                 General Comments: AxO x 2 pleasant Lady, cooperative.  Able to express needs "  go to the bathroom".        Exercises      General Comments        Pertinent Vitals/Pain Pain Assessment Pain Assessment: Faces Faces Pain Scale: Hurts a little bit Pain Location: ABD Pain Descriptors / Indicators: Cramping Pain Intervention(s): Monitored during session    Home Living                          Prior Function            PT Goals (current goals can now be found in the care plan section) Progress towards PT goals: Progressing toward goals    Frequency    Min 1X/week      PT Plan      Co-evaluation              AM-PAC PT  "6 Clicks" Mobility   Outcome Measure  Help needed turning from your back to your side while in a flat bed without using bedrails?: A Lot Help needed moving from lying on your back to sitting on the side of a flat bed without using bedrails?: A Lot Help needed moving to and from a bed to a chair (including a wheelchair)?: A Lot Help needed standing up from a chair using your arms (e.g., wheelchair or bedside chair)?: A Lot Help needed to walk in hospital room?: A Lot Help needed climbing 3-5 steps with a railing? : Total 6 Click Score: 11    End of Session Equipment Utilized During Treatment: Gait belt Activity Tolerance: Patient tolerated treatment well Patient left: in chair;with call bell/phone within reach;with chair alarm set Nurse Communication: Mobility status PT Visit Diagnosis: Unsteadiness on feet (R26.81);Difficulty in walking, not elsewhere classified (R26.2);Other symptoms and signs involving the nervous system (R29.898);Pain     Time: 1120-1140 PT Time Calculation (min) (ACUTE ONLY): 20 min  Charges:    $Gait Training: 8-22 mins PT General Charges $$ ACUTE PT VISIT: 1 Visit                    Felecia Shelling  PTA Acute  Rehabilitation Services Office M-F          [phone]

## 2023-08-28 NOTE — TOC Progression Note (Signed)
Transition of Care [facility]) - Progression Note    Patient Details  Name: EMRYN HIOTT MRN: [id number] Date of Birth: 09/20/1946  [facility] CM/SW Contact  Ammaar Encina, Olegario Messier, RN Phone Number: 08/28/2023, 12:34 PM  Clinical Narrative:  [facility]erkley Harvey initiated for Alcide Clever rep Raoul Pitch aware of pending [id number]. [name] updated.     Expected Discharge Plan: Skilled Nursing Facility Barriers to Discharge: Insurance Authorization  Expected Discharge Plan and Services                                               Social Determinants of Health (SDOH) Interventions SDOH Screenings   Food Insecurity: No Food Insecurity (08/21/2023)  Housing: Low Risk  (08/21/2023)  Transportation Needs: No Transportation Needs (08/21/2023)  Utilities: Not At Risk (08/21/2023)  Alcohol Screen: Low Risk  (12/29/2021)  Depression (PHQ2-9): Low Risk  (01/02/2023)  Financial Resource Strain: Low Risk  (06/01/2023)  Physical Activity: Inactive (01/02/2023)  Social Connections: Unknown (06/01/2023)  Stress: Stress Concern Present (06/01/2023)  Tobacco Use: Low Risk  (08/21/2023)    Readmission Risk Interventions     No data to display

## 2023-08-29 DIAGNOSIS — T796XXS Traumatic ischemia of muscle, sequela: Secondary | ICD-10-CM | POA: Diagnosis not present

## 2023-08-29 DIAGNOSIS — N179 Acute kidney failure, unspecified: Secondary | ICD-10-CM | POA: Diagnosis not present

## 2023-08-29 DIAGNOSIS — Z9181 History of falling: Secondary | ICD-10-CM | POA: Diagnosis not present

## 2023-08-29 DIAGNOSIS — I10 Essential (primary) hypertension: Secondary | ICD-10-CM | POA: Diagnosis not present

## 2023-08-29 DIAGNOSIS — E119 Type 2 diabetes mellitus without complications: Secondary | ICD-10-CM | POA: Diagnosis not present

## 2023-08-29 DIAGNOSIS — D72829 Elevated white blood cell count, unspecified: Secondary | ICD-10-CM | POA: Diagnosis not present

## 2023-08-29 DIAGNOSIS — G20A1 Parkinson's disease without dyskinesia, without mention of fluctuations: Secondary | ICD-10-CM | POA: Diagnosis not present

## 2023-08-29 DIAGNOSIS — M6282 Rhabdomyolysis: Secondary | ICD-10-CM | POA: Diagnosis not present

## 2023-08-29 DIAGNOSIS — Z7401 Bed confinement status: Secondary | ICD-10-CM | POA: Diagnosis not present

## 2023-08-29 DIAGNOSIS — R197 Diarrhea, unspecified: Secondary | ICD-10-CM | POA: Diagnosis not present

## 2023-08-29 DIAGNOSIS — E1165 Type 2 diabetes mellitus with hyperglycemia: Secondary | ICD-10-CM | POA: Diagnosis not present

## 2023-08-29 DIAGNOSIS — M6281 Muscle weakness (generalized): Secondary | ICD-10-CM | POA: Diagnosis not present

## 2023-08-29 DIAGNOSIS — D638 Anemia in other chronic diseases classified elsewhere: Secondary | ICD-10-CM | POA: Diagnosis not present

## 2023-08-29 DIAGNOSIS — R41841 Cognitive communication deficit: Secondary | ICD-10-CM | POA: Diagnosis not present

## 2023-08-29 DIAGNOSIS — L89150 Pressure ulcer of sacral region, unstageable: Secondary | ICD-10-CM | POA: Diagnosis not present

## 2023-08-29 DIAGNOSIS — E039 Hypothyroidism, unspecified: Secondary | ICD-10-CM | POA: Diagnosis not present

## 2023-08-29 DIAGNOSIS — R531 Weakness: Secondary | ICD-10-CM | POA: Diagnosis not present

## 2023-08-29 DIAGNOSIS — Z5189 Encounter for other specified aftercare: Secondary | ICD-10-CM | POA: Diagnosis not present

## 2023-08-29 DIAGNOSIS — R600 Localized edema: Secondary | ICD-10-CM | POA: Diagnosis not present

## 2023-08-29 DIAGNOSIS — S31000D Unspecified open wound of lower back and pelvis without penetration into retroperitoneum, subsequent encounter: Secondary | ICD-10-CM | POA: Diagnosis not present

## 2023-08-29 DIAGNOSIS — R278 Other lack of coordination: Secondary | ICD-10-CM | POA: Diagnosis not present

## 2023-08-29 DIAGNOSIS — R410 Disorientation, unspecified: Secondary | ICD-10-CM | POA: Diagnosis not present

## 2023-08-29 DIAGNOSIS — R2689 Other abnormalities of gait and mobility: Secondary | ICD-10-CM | POA: Diagnosis not present

## 2023-08-29 DIAGNOSIS — E782 Mixed hyperlipidemia: Secondary | ICD-10-CM | POA: Diagnosis not present

## 2023-08-29 DIAGNOSIS — E860 Dehydration: Secondary | ICD-10-CM | POA: Diagnosis not present

## 2023-08-29 LAB — BASIC METABOLIC PANEL
Anion gap: 10 (ref 5–15)
BUN: 24 mg/dL — ABNORMAL HIGH (ref 8–23)
CO2: 25 mmol/L (ref 22–32)
Calcium: 8.8 mg/dL — ABNORMAL LOW (ref 8.9–10.3)
Chloride: 104 mmol/L (ref 98–111)
Creatinine, Ser: 1.2 mg/dL — ABNORMAL HIGH (ref 0.44–1.00)
GFR, Estimated: 46 mL/min — ABNORMAL LOW (ref >60)
Glucose, Bld: 157 mg/dL — ABNORMAL HIGH (ref 70–99)
Potassium: 4.4 mmol/L (ref 3.5–5.1)
Sodium: 139 mmol/L (ref 135–145)

## 2023-08-29 LAB — GLUCOSE, CAPILLARY
Glucose-Capillary: 134 mg/dL — ABNORMAL HIGH (ref 70–99)
Glucose-Capillary: 153 mg/dL — ABNORMAL HIGH (ref 70–99)

## 2023-08-29 MED ORDER — SENNOSIDES-DOCUSATE SODIUM 8.6-50 MG PO TABS: 1.0000 | ORAL_TABLET | Freq: Two times a day (BID) | ORAL | Status: AC

## 2023-08-29 MED ORDER — BISACODYL 10 MG RE SUPP
10.0000 mg | Freq: Once | RECTAL | Status: AC
  Administered 2023-08-29: 10 mg via RECTAL
  Filled 2023-08-29: qty 1

## 2023-08-29 MED ORDER — ACETAMINOPHEN 325 MG PO TABS: 650.0000 mg | ORAL_TABLET | Freq: Four times a day (QID) | ORAL | Status: AC | PRN

## 2023-08-29 MED ORDER — ATORVASTATIN CALCIUM 40 MG PO TABS: 40.0000 mg | ORAL_TABLET | Freq: Every day | ORAL | Status: AC

## 2023-08-29 MED ORDER — POLYETHYLENE GLYCOL 3350 17 G PO PACK: 17.0000 g | PACK | Freq: Two times a day (BID) | ORAL | Status: AC

## 2023-08-29 MED ORDER — AMLODIPINE BESYLATE 5 MG PO TABS: 5.0000 mg | ORAL_TABLET | Freq: Every day | ORAL | Status: AC

## 2023-08-29 MED ORDER — MEDIHONEY WOUND/BURN DRESSING EX PSTE: 1.0000 | PASTE | Freq: Every day | CUTANEOUS | Status: AC

## 2023-08-29 NOTE — Plan of Care (Signed)
  Problem: Activity: Goal: Risk for activity intolerance will decrease Outcome: Progressing   Problem: Elimination: Goal: Will not experience complications related to bowel motility Outcome: Not Progressing   Problem: Pain Management: Goal: General experience of comfort will improve Outcome: Progressing   Problem: Safety: Goal: Ability to remain free from injury will improve Outcome: Progressing   Problem: Skin Integrity: Goal: Risk for impaired skin integrity will decrease Outcome: Progressing

## 2023-08-29 NOTE — Progress Notes (Signed)
The patient is stable, no change from am assessment. Report called to [facility]. Amelia received report. She denied questions or concerns at this time.

## 2023-08-29 NOTE — TOC Progression Note (Signed)
Transition of Care [facility]) - Progression Note    Patient Details  Name: Pamela Patton MRN: [id number] Date of Birth: Dec 30, 1945  Transition of Care [facility]) CM/SW Contact  Darleene Cleaver, [place] Phone Number: 08/29/2023, 11:11 AM  Clinical Narrative:     Patient has been approved for SNF placement at [facility], insurance authorization reference [id number] next review 08/30/2023.  CSW updated attending physician and [place] at [facility].    Expected Discharge Plan: Skilled Nursing Facility Barriers to Discharge: Insurance Authorization  Expected Discharge Plan and Services  Patient to go to SNF at [facility].                                             Social Determinants of Health (SDOH) Interventions SDOH Screenings   Food Insecurity: No Food Insecurity (08/21/2023)  Housing: Low Risk  (08/21/2023)  Transportation Needs: No Transportation Needs (08/21/2023)  Utilities: Not At Risk (08/21/2023)  Alcohol Screen: Low Risk  (12/29/2021)  Depression (PHQ2-9): Low Risk  (01/02/2023)  Financial Resource Strain: Low Risk  (06/01/2023)  Physical Activity: Inactive (01/02/2023)  Social Connections: Unknown (06/01/2023)  Stress: Stress Concern Present (06/01/2023)  Tobacco Use: Low Risk  (08/21/2023)    Readmission Risk Interventions     No data to display

## 2023-08-29 NOTE — Discharge Summary (Signed)
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 CLINICAL DATA:  Status post fall. EXAM: RIGHT KNEE - COMPLETE 4+ VIEW COMPARISON:  None Available. FINDINGS: No evidence of an acute fracture or dislocation. Medial and lateral marginal osteophytes are noted. There is moderate to marked severity tricompartmental joint space narrowing. A small joint effusion is noted. IMPRESSION: 1. 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 2. Small joint effusion. Electronically Signed   By: Aram Candela M.D.   On: 08/21/2023 20:44   DG Knee Complete 4 Views Left  Result Date: 08/21/2023 CLINICAL DATA:  Status post fall. EXAM: LEFT KNEE - COMPLETE 4+ VIEW COMPARISON:  None Available. FINDINGS: No evidence of fracture, dislocation, or joint effusion. Medial and lateral marginal osteophytes are noted. There is moderate to marked severity tricompartmental  joint space narrowing. Soft tissues are unremarkable. 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 Electronically Signed   By: Aram Candela M.D.   On: 08/21/2023 20:43   CT HEAD WO CONTRAST ( )  Result Date: 08/21/2023 CLINICAL DATA:  Multiple falls over the past several days EXAM: CT HEAD WITHOUT CONTRAST CT CERVICAL SPINE WITHOUT CONTRAST TECHNIQUE: Multidetector CT imaging of the head and cervical spine was performed following the standard protocol without intravenous contrast. Multiplanar CT image reconstructions of the cervical spine were also generated. RADIATION DOSE REDUCTION: This exam was performed  according to the departmental dose-optimization program which includes automated exposure control, adjustment of the mA and/or kV according to patient size and/or use of iterative reconstruction technique. COMPARISON:  01/18/2023 CT head and cervical spine FINDINGS: CT HEAD FINDINGS Brain: No evidence of acute infarct, hemorrhage, mass, mass effect, or midline shift. No hydrocephalus or extra-axial fluid collection. Periventricular white matter changes, likely the sequela of chronic small vessel ischemic disease. Vascular: No hyperdense vessel. Skull: Negative for fracture or focal lesion. Sinuses/Orbits: No acute finding. Status post bilateral lens replacements. Other: The mastoid air cells are well aerated. CT CERVICAL SPINE FINDINGS Alignment: No traumatic listhesis. Skull base and vertebrae: No acute fracture or suspicious osseous lesion. Soft tissues and spinal canal: No prevertebral fluid or swelling. No visible canal hematoma. Disc levels: Degenerative changes in the cervical spine.No high-grade spinal canal stenosis. Upper chest: No focal pulmonary opacity or pleural effusion. IMPRESSION: 1. No acute intracranial process. 2. No acute fracture or traumatic listhesis in the cervical spine. Electronically Signed   By: Wiliam Ke M.D.   On: 08/21/2023 20:43   CT  Cervical Spine Wo Contrast  Result Date: 08/21/2023 CLINICAL DATA:  Multiple falls over the past several days EXAM: CT HEAD WITHOUT CONTRAST CT CERVICAL SPINE WITHOUT CONTRAST TECHNIQUE: Multidetector CT imaging of the head and cervical spine was performed following the standard protocol without intravenous contrast. Multiplanar CT image reconstructions of the cervical spine were also generated. RADIATION DOSE REDUCTION: This exam was performed according to the departmental dose-optimization program which includes automated exposure control, adjustment of the mA and/or kV according to patient size and/or use of iterative reconstruction technique. COMPARISON:  01/18/2023 CT head and cervical spine FINDINGS: CT HEAD FINDINGS Brain: No evidence of acute infarct, hemorrhage, mass, mass effect, or midline shift. No hydrocephalus or extra-axial fluid collection. Periventricular white matter changes, likely the sequela of chronic small vessel ischemic disease. Vascular: No hyperdense vessel. Skull: Negative for fracture or focal lesion. Sinuses/Orbits: No acute finding. Status post bilateral lens replacements. Other: The mastoid air cells are well aerated. CT CERVICAL SPINE FINDINGS Alignment: No traumatic listhesis. Skull base and vertebrae: No acute fracture or suspicious osseous lesion. Soft tissues and spinal canal: No prevertebral fluid or swelling. No visible canal hematoma. Disc levels: Degenerative changes in the cervical spine.No high-grade spinal canal stenosis. Upper chest: No focal pulmonary opacity or pleural effusion. IMPRESSION: 1. No acute intracranial process. 2. No acute fracture or traumatic listhesis in the cervical spine. Electronically Signed   By: Wiliam Ke M.D.   On: 08/21/2023 20:43   DG Chest 2 View  Result Date: 08/21/2023 CLINICAL DATA:  Status post fall. EXAM: CHEST - 2 VIEW COMPARISON:  November 28, 2017 FINDINGS: The heart size and mediastinal contours are within normal limits.  There is moderate severity calcification of the aortic arch and tortuosity of the descending thoracic aorta. Both lungs are clear. The visualized skeletal structures are unremarkable. IMPRESSION: No active cardiopulmonary disease. Electronically Signed   By: Aram Candela M.D.   On: 08/21/2023 20:42   DG BONE DENSITY (DXA)  Result Date: 08/17/2023 EXAM: DUAL X-RAY ABSORPTIOMETRY (DXA) FOR BONE MINERAL DENSITY IMPRESSION: Referring Physician:  Felicity Coyer Your patient completed a bone mineral density test using GE Lunar iDXA system (analysis version: 16). Technologist: BEC PATIENT: Name: Pamela Patton, Pamela Patton Patient ID: [id number] Birth Date: [birth year] Height: 63.5 in. Sex: Female Measured: 08/17/2023 Weight: 124.0 lbs. Indications: Advanced Age, Bilateral Ovariectomy, Estrogen Deficient, Graves Disease, Hypothyroid, Hysterectomy, Insulin for Diabetes, Levothyroxine,  Postmenopausal, Synthroid, Vitamin D Deficient Fractures: Left wrist Treatments: Calcium (E943.0), Vitamin D (E933.5) ASSESSMENT: The BMD measured at Femur Neck Left is 0.584 g/cm2 with a T-score of -3.3. This patient's diagnostic category is OSTEOPOROSIS according to World Health Organization [facility]) criteria. L3 was excluded due to degenerative changes. The quality of the exam is good. Comparison to 07/31/2017. Since the prior study, there has been a SIGNIFICANT DECREASE in bone mineral density of the lumbar spine (-11.6%) and of the hips (-18.1%). Site Region Measured Date Measured Age YA BMD Significant CHANGE T-score AP Spine L1-L4 (L3) 08/17/2023 78.8 -1.9 0.826 g/cm2 * AP Spine  L1-L4 (L3) 07/31/2017    72.8         -2.0    0.934 g/cm2 DualFemur Neck Left 08/17/2023 78.8 -3.3 0.584 g/cm2 * DualFemur Neck Left  07/31/2017    72.8         -2.6    0.674 g/cm2 DualFemur Total Mean 08/17/2023 78.8 -3.1 0.618 g/cm2 * DualFemur Total Mean 07/31/2017    72.8         -2.0    0.755 g/cm2 World Health Organization [facility]) criteria for post-menopausal,  Caucasian Women: Normal       T-score at or above -1 SD Osteopenia   T-score between -1 and -2.5 SD Osteoporosis T-score at or below -2.5 SD RECOMMENDATION: 1. All patients should optimize calcium and vitamin D intake. 2. Consider FDA-approved medical therapies in postmenopausal women and men aged 54 years and older, based on the following: a. A hip or vertebral (clinical or morphometric) fracture. b. T-score = -2.5 at the femoral neck or spine after appropriate evaluation to exclude secondary causes. c. Low bone mass (T-score between -1.0 and -2.5 at the femoral neck or spine) and a 10-year probability of a hip fracture = 3% or a 10-year probability of a major osteoporosis-related fracture = 20% based on the [place]-adapted WHO algorithm. d. Clinician judgment and/or patient preferences may indicate treatment for people with 10-year fracture probabilities above or below these levels. FOLLOW-UP: Patients with diagnosis of osteoporosis or at high risk for fracture should have regular bone mineral density tests.? Patients eligible for Medicare are allowed routine testing every 2 years.? The testing frequency can be increased to one year for patients who have rapidly progressing disease, are receiving or discontinuing medical therapy to restore bone mass, or have additional risk factors. I have reviewed this study and agree with the findings. [facility] Electronically Signed   By: Harmon Pier M.D.   On: 08/17/2023 10:05    Microbiology: Recent Results (from the past 240 hour(s))  Resp panel by RT-PCR (RSV, Flu A&B, Covid) Anterior Nasal Swab     Status: None   Collection Time: 08/21/23  5:30 PM   Specimen: Anterior Nasal Swab  Result Value Ref Range Status   SARS Coronavirus 2 by RT PCR NEGATIVE NEGATIVE Final    Comment: (NOTE) SARS-CoV-2 target nucleic acids are NOT DETECTED.  The SARS-CoV-2 RNA is generally detectable in upper respiratory specimens during the acute phase of infection. The  lowest concentration of SARS-CoV-2 viral copies this assay can detect is 138 copies/mL. A negative result does not preclude SARS-Cov-2 infection and should not be used as the sole basis for treatment or other patient management decisions. A negative result may occur with  improper specimen collection/handling, submission of specimen other than nasopharyngeal swab, presence of viral mutation(s) within the areas targeted by this assay, and inadequate number of viral copies(<138 copies/mL).  A negative result must be combined with clinical observations, patient history, and epidemiological information. The expected result is Negative.  Fact Sheet for Patients:  BloggerCourse.com  Fact Sheet for Healthcare Providers:  SeriousBroker.it  This test is no t yet approved or cleared by the [place] FDA and  has been authorized for detection and/or diagnosis of SARS-CoV-2 by FDA under an Emergency Use Authorization (EUA). This EUA will remain  in effect (meaning this test can be used) for the duration of the COVID-19 declaration under Section 564(b)(1) of the Act, 21 U.S.C.section 360bbb-3(b)(1), unless the authorization is terminated  or revoked sooner.       Influenza A by PCR NEGATIVE NEGATIVE Final   Influenza B by PCR NEGATIVE NEGATIVE Final    Comment: (NOTE) The Xpert Xpress SARS-CoV-2/FLU/RSV plus assay is intended as an aid in the diagnosis of influenza from Nasopharyngeal swab specimens and should not be used as a sole basis for treatment. Nasal washings and aspirates are unacceptable for Xpert Xpress SARS-CoV-2/FLU/RSV testing.  Fact Sheet for Patients: BloggerCourse.com  Fact Sheet for Healthcare Providers: SeriousBroker.it  This test is not yet approved or cleared by the [place] FDA and has been authorized for detection and/or diagnosis of SARS-CoV-2 by FDA under  an Emergency Use Authorization (EUA). This EUA will remain in effect (meaning this test can be used) for the duration of the COVID-19 declaration under Section 564(b)(1) of the Act, 21 U.S.C. section 360bbb-3(b)(1), unless the authorization is terminated or revoked.     Resp Syncytial Virus by PCR NEGATIVE NEGATIVE Final    Comment: (NOTE) Fact Sheet for Patients: BloggerCourse.com  Fact Sheet for Healthcare Providers: SeriousBroker.it  This test is not yet approved or cleared by the [place] FDA and has been authorized for detection and/or diagnosis of SARS-CoV-2 by FDA under an Emergency Use Authorization (EUA). This EUA will remain in effect (meaning this test can be used) for the duration of the COVID-19 declaration under Section 564(b)(1) of the Act, 21 U.S.C. section 360bbb-3(b)(1), unless the authorization is terminated or revoked.  Performed at [facility], [address], [place], [place] [address]   Urine Culture (for pregnant, neutropenic or urologic patients or patients with an indwelling urinary catheter)     Status: None   Collection Time: 08/27/23 10:02 AM   Specimen: Urine, Catheterized  Result Value Ref Range Status   Specimen Description   Final    URINE, CATHETERIZED Performed at [facility], [address] [place], [place] [address]    Special Requests   Final    NONE Performed at [facility], [address], [place], [place] [address]    Culture   Final    NO GROWTH Performed at [facility], [address] [place], [place] [address]    Report Status 08/28/2023 FINAL  Final     Labs: Basic Metabolic Panel: Recent Labs  Lab 08/25/23 0529 08/26/23 0549 08/27/23 0512 08/28/23 0431 08/29/23 0645  NA 140 137 134* 139 139  K 4.0 3.7 3.8 4.1 4.4  CL 110 106 105 107 104  CO2 23 25 23 25 25   GLUCOSE 142* 84 123* 154* 157*  BUN  21 18 19 23  24*  CREATININE 1.05* 1.04* 1.17* 1.32* 1.20*  CALCIUM 8.5* 8.4* 8.2* 8.4* 8.8*  MG  --   --  1.7 2.0  --    Liver Function Tests: Recent Labs  Lab 08/23/23 0509 08/24/23 0536 08/25/23 0529  AST 57* 42* 32  ALT 12 8 8   ALKPHOS 43 38 45  BILITOT 1.0 0.9 0.7  PROT 6.0* 5.8* 5.9*  ALBUMIN 3.1* 2.9* 3.0*   No results for input(s): "LIPASE", "AMYLASE" in the last 168 hours. Recent Labs  Lab 08/23/23 0509  AMMONIA 33   CBC: Recent Labs  Lab 08/23/23 0509 08/24/23 0536 08/25/23 0529 08/26/23 0549 08/27/23 0512 08/28/23 0431  WBC 8.6 7.6 8.7  --  12.3* 9.7  NEUTROABS 6.5  --   --   --   --  7.4  HGB 8.5* 8.6* 9.7* 9.3* 9.0* 8.2*  HCT 27.6* 27.7* 30.6* 30.0* 29.4* 26.1*  MCV 95.2 97.5 97.1  --  96.7 94.2  PLT 184 182 198  --  277 290   Cardiac Enzymes: Recent Labs  Lab 08/23/23 0509 08/24/23 0536 08/25/23 0529 08/26/23 0549  CKTOTAL 1,479* 817* 489* 291*   BNP: BNP (last 3 results) No results for input(s): "BNP" in the last 8760 hours.  ProBNP (last 3 results) No results for input(s): "PROBNP" in the last 8760 hours.  CBG: Recent Labs  Lab 08/28/23 1139 08/28/23 1701 08/28/23 2052 08/29/23 0741 08/29/23 1136  GLUCAP 137* 99 210* 134* 153*       Signed:  Ramiro Harvest MD.  [facility] 08/29/2023, 12:50 PM

## 2023-08-29 NOTE — TOC Transition Note (Addendum)
Transition of Care [facility]) - CM/SW Discharge Note   Patient Details  Name: Pamela Patton MRN: [id number] Date of Birth: March 19, 1946  Transition of Care [facility]) CM/SW Contact:  Darleene Cleaver, LCSW Phone Number: 08/29/2023, 11:19 AM   Clinical Narrative:     Patient to be d/c'ed today to [facility] room 102.  Patient and family agreeable to plans will transport via ems RN to call report [phone].  Patient's son was made aware that patient is discharging today.  EMS called 1:18pm [phone]  Final next level of care: Skilled Nursing Facility Barriers to Discharge: Barriers Resolved   Patient Goals and CMS Choice CMS Medicare.gov Compare Post Acute Care list provided to:: Patient Choice offered to / list presented to : Patient, Adult Children  Discharge Placement  Patient will be going to [facility] for short term rehab.  PASRR number recieved: 08/24/23 PASRR number recieved: 08/24/23            Patient chooses bed at: [facility] Patient to be transferred to facility by: [facility] Name of family member notified: Patient's son Pamela Patton Patient and family notified of of transfer: 08/29/23  Discharge Plan and Services Additional resources added to the After Visit Summary for                                       Social Determinants of Health (SDOH) Interventions SDOH Screenings   Food Insecurity: No Food Insecurity (08/21/2023)  Housing: Low Risk  (08/21/2023)  Transportation Needs: No Transportation Needs (08/21/2023)  Utilities: Not At Risk (08/21/2023)  Alcohol Screen: Low Risk  (12/29/2021)  Depression (PHQ2-9): Low Risk  (01/02/2023)  Financial Resource Strain: Low Risk  (06/01/2023)  Physical Activity: Inactive (01/02/2023)  Social Connections: Unknown (06/01/2023)  Stress: Stress Concern Present (06/01/2023)  Tobacco Use: Low Risk  (08/21/2023)     Readmission Risk Interventions     No data to display

## 2023-08-30 DIAGNOSIS — I10 Essential (primary) hypertension: Secondary | ICD-10-CM | POA: Diagnosis not present

## 2023-08-30 DIAGNOSIS — Z9181 History of falling: Secondary | ICD-10-CM | POA: Diagnosis not present

## 2023-08-30 DIAGNOSIS — M6282 Rhabdomyolysis: Secondary | ICD-10-CM | POA: Diagnosis not present

## 2023-08-30 DIAGNOSIS — Z5189 Encounter for other specified aftercare: Secondary | ICD-10-CM | POA: Diagnosis not present

## 2023-08-30 DIAGNOSIS — E1165 Type 2 diabetes mellitus with hyperglycemia: Secondary | ICD-10-CM | POA: Diagnosis not present

## 2023-08-30 DIAGNOSIS — D638 Anemia in other chronic diseases classified elsewhere: Secondary | ICD-10-CM | POA: Diagnosis not present

## 2023-08-30 DIAGNOSIS — G20A1 Parkinson's disease without dyskinesia, without mention of fluctuations: Secondary | ICD-10-CM | POA: Diagnosis not present

## 2023-09-04 DIAGNOSIS — Z9181 History of falling: Secondary | ICD-10-CM | POA: Diagnosis not present

## 2023-09-04 DIAGNOSIS — E1165 Type 2 diabetes mellitus with hyperglycemia: Secondary | ICD-10-CM | POA: Diagnosis not present

## 2023-09-04 DIAGNOSIS — M6282 Rhabdomyolysis: Secondary | ICD-10-CM | POA: Diagnosis not present

## 2023-09-04 DIAGNOSIS — D638 Anemia in other chronic diseases classified elsewhere: Secondary | ICD-10-CM | POA: Diagnosis not present

## 2023-09-04 DIAGNOSIS — G20A1 Parkinson's disease without dyskinesia, without mention of fluctuations: Secondary | ICD-10-CM | POA: Diagnosis not present

## 2023-09-04 DIAGNOSIS — I10 Essential (primary) hypertension: Secondary | ICD-10-CM | POA: Diagnosis not present

## 2023-09-04 DIAGNOSIS — Z5189 Encounter for other specified aftercare: Secondary | ICD-10-CM | POA: Diagnosis not present

## 2023-09-05 DIAGNOSIS — G20A1 Parkinson's disease without dyskinesia, without mention of fluctuations: Secondary | ICD-10-CM | POA: Diagnosis not present

## 2023-09-05 DIAGNOSIS — M6281 Muscle weakness (generalized): Secondary | ICD-10-CM | POA: Diagnosis not present

## 2023-09-05 DIAGNOSIS — R2689 Other abnormalities of gait and mobility: Secondary | ICD-10-CM | POA: Diagnosis not present

## 2023-09-05 DIAGNOSIS — R278 Other lack of coordination: Secondary | ICD-10-CM | POA: Diagnosis not present

## 2023-09-05 DIAGNOSIS — L89150 Pressure ulcer of sacral region, unstageable: Secondary | ICD-10-CM | POA: Diagnosis not present

## 2023-09-06 DIAGNOSIS — G20A1 Parkinson's disease without dyskinesia, without mention of fluctuations: Secondary | ICD-10-CM | POA: Diagnosis not present

## 2023-09-06 DIAGNOSIS — I10 Essential (primary) hypertension: Secondary | ICD-10-CM | POA: Diagnosis not present

## 2023-09-06 DIAGNOSIS — Z5189 Encounter for other specified aftercare: Secondary | ICD-10-CM | POA: Diagnosis not present

## 2023-09-06 DIAGNOSIS — M6282 Rhabdomyolysis: Secondary | ICD-10-CM | POA: Diagnosis not present

## 2023-09-06 DIAGNOSIS — E1165 Type 2 diabetes mellitus with hyperglycemia: Secondary | ICD-10-CM | POA: Diagnosis not present

## 2023-09-06 DIAGNOSIS — Z9181 History of falling: Secondary | ICD-10-CM | POA: Diagnosis not present

## 2023-09-06 DIAGNOSIS — D638 Anemia in other chronic diseases classified elsewhere: Secondary | ICD-10-CM | POA: Diagnosis not present

## 2023-09-07 DIAGNOSIS — Z5189 Encounter for other specified aftercare: Secondary | ICD-10-CM | POA: Diagnosis not present

## 2023-09-07 DIAGNOSIS — D638 Anemia in other chronic diseases classified elsewhere: Secondary | ICD-10-CM | POA: Diagnosis not present

## 2023-09-07 DIAGNOSIS — E1165 Type 2 diabetes mellitus with hyperglycemia: Secondary | ICD-10-CM | POA: Diagnosis not present

## 2023-09-07 DIAGNOSIS — Z9181 History of falling: Secondary | ICD-10-CM | POA: Diagnosis not present

## 2023-09-07 DIAGNOSIS — G20A1 Parkinson's disease without dyskinesia, without mention of fluctuations: Secondary | ICD-10-CM | POA: Diagnosis not present

## 2023-09-07 DIAGNOSIS — I10 Essential (primary) hypertension: Secondary | ICD-10-CM | POA: Diagnosis not present

## 2023-09-07 DIAGNOSIS — M6282 Rhabdomyolysis: Secondary | ICD-10-CM | POA: Diagnosis not present

## 2023-09-08 DIAGNOSIS — M6281 Muscle weakness (generalized): Secondary | ICD-10-CM | POA: Diagnosis not present

## 2023-09-08 DIAGNOSIS — Z9181 History of falling: Secondary | ICD-10-CM | POA: Diagnosis not present

## 2023-09-08 DIAGNOSIS — G20A1 Parkinson's disease without dyskinesia, without mention of fluctuations: Secondary | ICD-10-CM | POA: Diagnosis not present

## 2023-09-08 DIAGNOSIS — I10 Essential (primary) hypertension: Secondary | ICD-10-CM | POA: Diagnosis not present

## 2023-09-08 DIAGNOSIS — D638 Anemia in other chronic diseases classified elsewhere: Secondary | ICD-10-CM | POA: Diagnosis not present

## 2023-09-08 DIAGNOSIS — R278 Other lack of coordination: Secondary | ICD-10-CM | POA: Diagnosis not present

## 2023-09-08 DIAGNOSIS — M6282 Rhabdomyolysis: Secondary | ICD-10-CM | POA: Diagnosis not present

## 2023-09-08 DIAGNOSIS — R2689 Other abnormalities of gait and mobility: Secondary | ICD-10-CM | POA: Diagnosis not present

## 2023-09-08 DIAGNOSIS — L89150 Pressure ulcer of sacral region, unstageable: Secondary | ICD-10-CM | POA: Diagnosis not present

## 2023-09-08 DIAGNOSIS — Z5189 Encounter for other specified aftercare: Secondary | ICD-10-CM | POA: Diagnosis not present

## 2023-09-08 DIAGNOSIS — E1165 Type 2 diabetes mellitus with hyperglycemia: Secondary | ICD-10-CM | POA: Diagnosis not present

## 2023-09-11 DIAGNOSIS — R600 Localized edema: Secondary | ICD-10-CM | POA: Diagnosis not present

## 2023-09-11 DIAGNOSIS — M6282 Rhabdomyolysis: Secondary | ICD-10-CM | POA: Diagnosis not present

## 2023-09-11 DIAGNOSIS — Z9181 History of falling: Secondary | ICD-10-CM | POA: Diagnosis not present

## 2023-09-11 DIAGNOSIS — R197 Diarrhea, unspecified: Secondary | ICD-10-CM | POA: Diagnosis not present

## 2023-09-11 DIAGNOSIS — I10 Essential (primary) hypertension: Secondary | ICD-10-CM | POA: Diagnosis not present

## 2023-09-11 DIAGNOSIS — G20A1 Parkinson's disease without dyskinesia, without mention of fluctuations: Secondary | ICD-10-CM | POA: Diagnosis not present

## 2023-09-11 DIAGNOSIS — E1165 Type 2 diabetes mellitus with hyperglycemia: Secondary | ICD-10-CM | POA: Diagnosis not present

## 2023-09-11 DIAGNOSIS — D638 Anemia in other chronic diseases classified elsewhere: Secondary | ICD-10-CM | POA: Diagnosis not present

## 2023-09-11 DIAGNOSIS — Z5189 Encounter for other specified aftercare: Secondary | ICD-10-CM | POA: Diagnosis not present

## 2023-09-12 DIAGNOSIS — G20A1 Parkinson's disease without dyskinesia, without mention of fluctuations: Secondary | ICD-10-CM | POA: Diagnosis not present

## 2023-09-12 DIAGNOSIS — R2689 Other abnormalities of gait and mobility: Secondary | ICD-10-CM | POA: Diagnosis not present

## 2023-09-12 DIAGNOSIS — R278 Other lack of coordination: Secondary | ICD-10-CM | POA: Diagnosis not present

## 2023-09-12 DIAGNOSIS — L89150 Pressure ulcer of sacral region, unstageable: Secondary | ICD-10-CM | POA: Diagnosis not present

## 2023-09-12 DIAGNOSIS — M6281 Muscle weakness (generalized): Secondary | ICD-10-CM | POA: Diagnosis not present

## 2023-09-13 DIAGNOSIS — Z9181 History of falling: Secondary | ICD-10-CM | POA: Diagnosis not present

## 2023-09-13 DIAGNOSIS — G20A1 Parkinson's disease without dyskinesia, without mention of fluctuations: Secondary | ICD-10-CM | POA: Diagnosis not present

## 2023-09-13 DIAGNOSIS — I10 Essential (primary) hypertension: Secondary | ICD-10-CM | POA: Diagnosis not present

## 2023-09-13 DIAGNOSIS — D638 Anemia in other chronic diseases classified elsewhere: Secondary | ICD-10-CM | POA: Diagnosis not present

## 2023-09-13 DIAGNOSIS — E1165 Type 2 diabetes mellitus with hyperglycemia: Secondary | ICD-10-CM | POA: Diagnosis not present

## 2023-09-13 DIAGNOSIS — R600 Localized edema: Secondary | ICD-10-CM | POA: Diagnosis not present

## 2023-09-13 DIAGNOSIS — R197 Diarrhea, unspecified: Secondary | ICD-10-CM | POA: Diagnosis not present

## 2023-09-13 DIAGNOSIS — Z5189 Encounter for other specified aftercare: Secondary | ICD-10-CM | POA: Diagnosis not present

## 2023-09-13 DIAGNOSIS — M6282 Rhabdomyolysis: Secondary | ICD-10-CM | POA: Diagnosis not present

## 2023-09-15 DIAGNOSIS — G20A1 Parkinson's disease without dyskinesia, without mention of fluctuations: Secondary | ICD-10-CM | POA: Diagnosis not present

## 2023-09-15 DIAGNOSIS — R41841 Cognitive communication deficit: Secondary | ICD-10-CM | POA: Diagnosis not present

## 2023-09-15 DIAGNOSIS — R296 Repeated falls: Secondary | ICD-10-CM | POA: Diagnosis not present

## 2023-09-15 DIAGNOSIS — M6282 Rhabdomyolysis: Secondary | ICD-10-CM | POA: Diagnosis not present

## 2023-09-15 DIAGNOSIS — L89150 Pressure ulcer of sacral region, unstageable: Secondary | ICD-10-CM | POA: Diagnosis not present

## 2023-09-15 DIAGNOSIS — R278 Other lack of coordination: Secondary | ICD-10-CM | POA: Diagnosis not present

## 2023-09-15 DIAGNOSIS — R2689 Other abnormalities of gait and mobility: Secondary | ICD-10-CM | POA: Diagnosis not present

## 2023-09-15 DIAGNOSIS — M6281 Muscle weakness (generalized): Secondary | ICD-10-CM | POA: Diagnosis not present

## 2023-09-16 DIAGNOSIS — R2689 Other abnormalities of gait and mobility: Secondary | ICD-10-CM | POA: Diagnosis not present

## 2023-09-16 DIAGNOSIS — M6282 Rhabdomyolysis: Secondary | ICD-10-CM | POA: Diagnosis not present

## 2023-09-16 DIAGNOSIS — R41841 Cognitive communication deficit: Secondary | ICD-10-CM | POA: Diagnosis not present

## 2023-09-16 DIAGNOSIS — M6281 Muscle weakness (generalized): Secondary | ICD-10-CM | POA: Diagnosis not present

## 2023-09-16 DIAGNOSIS — R278 Other lack of coordination: Secondary | ICD-10-CM | POA: Diagnosis not present

## 2023-09-18 DIAGNOSIS — E1165 Type 2 diabetes mellitus with hyperglycemia: Secondary | ICD-10-CM | POA: Diagnosis not present

## 2023-09-18 DIAGNOSIS — M6282 Rhabdomyolysis: Secondary | ICD-10-CM | POA: Diagnosis not present

## 2023-09-18 DIAGNOSIS — M6281 Muscle weakness (generalized): Secondary | ICD-10-CM | POA: Diagnosis not present

## 2023-09-18 DIAGNOSIS — R2689 Other abnormalities of gait and mobility: Secondary | ICD-10-CM | POA: Diagnosis not present

## 2023-09-18 DIAGNOSIS — R278 Other lack of coordination: Secondary | ICD-10-CM | POA: Diagnosis not present

## 2023-09-18 DIAGNOSIS — R41841 Cognitive communication deficit: Secondary | ICD-10-CM | POA: Diagnosis not present

## 2023-09-19 ENCOUNTER — Emergency Department (HOSPITAL_COMMUNITY)
Admission: EM | Admit: 2023-09-19 | Discharge: 2023-09-19 | Disposition: A | Discharge status: Skilled Nursing Facility | Payer: Medicare PPO | Attending: Emergency Medicine | Admitting: Emergency Medicine

## 2023-09-19 ENCOUNTER — Emergency Department (HOSPITAL_COMMUNITY): Payer: Medicare PPO

## 2023-09-19 ENCOUNTER — Encounter (HOSPITAL_COMMUNITY): Payer: Self-pay

## 2023-09-19 DIAGNOSIS — Z794 Long term (current) use of insulin: Secondary | ICD-10-CM | POA: Diagnosis not present

## 2023-09-19 DIAGNOSIS — I10 Essential (primary) hypertension: Secondary | ICD-10-CM | POA: Insufficient documentation

## 2023-09-19 DIAGNOSIS — S0990XA Unspecified injury of head, initial encounter: Secondary | ICD-10-CM | POA: Diagnosis not present

## 2023-09-19 DIAGNOSIS — Z79899 Other long term (current) drug therapy: Secondary | ICD-10-CM | POA: Diagnosis not present

## 2023-09-19 DIAGNOSIS — R296 Repeated falls: Secondary | ICD-10-CM | POA: Diagnosis not present

## 2023-09-19 DIAGNOSIS — S0083XA Contusion of other part of head, initial encounter: Secondary | ICD-10-CM | POA: Diagnosis not present

## 2023-09-19 DIAGNOSIS — G20A1 Parkinson's disease without dyskinesia, without mention of fluctuations: Secondary | ICD-10-CM | POA: Insufficient documentation

## 2023-09-19 DIAGNOSIS — M25552 Pain in left hip: Secondary | ICD-10-CM | POA: Diagnosis not present

## 2023-09-19 DIAGNOSIS — M25562 Pain in left knee: Secondary | ICD-10-CM | POA: Diagnosis not present

## 2023-09-19 DIAGNOSIS — E039 Hypothyroidism, unspecified: Secondary | ICD-10-CM | POA: Diagnosis not present

## 2023-09-19 DIAGNOSIS — W06XXXA Fall from bed, initial encounter: Secondary | ICD-10-CM | POA: Diagnosis not present

## 2023-09-19 DIAGNOSIS — R531 Weakness: Secondary | ICD-10-CM | POA: Diagnosis not present

## 2023-09-19 DIAGNOSIS — Z043 Encounter for examination and observation following other accident: Secondary | ICD-10-CM | POA: Diagnosis not present

## 2023-09-19 DIAGNOSIS — R519 Headache, unspecified: Secondary | ICD-10-CM | POA: Insufficient documentation

## 2023-09-19 DIAGNOSIS — W19XXXA Unspecified fall, initial encounter: Secondary | ICD-10-CM | POA: Diagnosis not present

## 2023-09-19 DIAGNOSIS — M25551 Pain in right hip: Secondary | ICD-10-CM | POA: Diagnosis not present

## 2023-09-19 DIAGNOSIS — Z7401 Bed confinement status: Secondary | ICD-10-CM | POA: Diagnosis not present

## 2023-09-19 DIAGNOSIS — Z7982 Long term (current) use of aspirin: Secondary | ICD-10-CM | POA: Diagnosis not present

## 2023-09-19 DIAGNOSIS — E119 Type 2 diabetes mellitus without complications: Secondary | ICD-10-CM | POA: Insufficient documentation

## 2023-09-19 DIAGNOSIS — M1612 Unilateral primary osteoarthritis, left hip: Secondary | ICD-10-CM | POA: Diagnosis not present

## 2023-09-19 DIAGNOSIS — Z743 Need for continuous supervision: Secondary | ICD-10-CM | POA: Diagnosis not present

## 2023-09-19 DIAGNOSIS — M542 Cervicalgia: Secondary | ICD-10-CM | POA: Diagnosis not present

## 2023-09-19 DIAGNOSIS — I700 Atherosclerosis of aorta: Secondary | ICD-10-CM | POA: Diagnosis not present

## 2023-09-19 DIAGNOSIS — Y92122 Bedroom in nursing home as the place of occurrence of the external cause: Secondary | ICD-10-CM | POA: Insufficient documentation

## 2023-09-19 LAB — CBG MONITORING, ED: Glucose-Capillary: 165 mg/dL — ABNORMAL HIGH (ref 70–99)

## 2023-09-19 MED ORDER — ACETAMINOPHEN 500 MG PO TABS
1000.0000 mg | ORAL_TABLET | Freq: Once | ORAL | Status: AC
  Administered 2023-09-19: 1000 mg via ORAL
  Filled 2023-09-19: qty 2

## 2023-09-19 NOTE — Discharge Instructions (Signed)
As discussed, your evaluation today has been largely reassuring.  But, it is important that you monitor your condition carefully, and do not hesitate to return to the ED if you develop new, or concerning changes in your condition. ? ?Otherwise, please follow-up with your physician for appropriate ongoing care. ? ?

## 2023-09-19 NOTE — ED Provider Notes (Signed)
6:45 PM Care of the patient assumed at signout.  I reviewed the patient's CT imaging, discussed with her and her family members.  We discussed the patient's ongoing rehab efforts and she has not been ambulatory for possibly as many as 3 weeks, but is receiving therapy.  We discussed options and the patient is comfortable returning to her rehabilitation facility.   Gerhard Munch, MD 09/19/23 [phone]

## 2023-09-19 NOTE — ED Triage Notes (Signed)
Unwitnessed fall. Lives in [place]. Pt was rolling on bed and fell off. Ended up hitting head against a metal table. Complains of left eye blurred vision, headache, and left hip pain. Hx of parkinson's. Nonambulatory at baseline.   EMS VS: BP 168/80 HR 100 RR 20 97% on RA CBG 155  98.8 Temporal temp

## 2023-09-20 DIAGNOSIS — M6282 Rhabdomyolysis: Secondary | ICD-10-CM | POA: Diagnosis not present

## 2023-09-20 DIAGNOSIS — R2689 Other abnormalities of gait and mobility: Secondary | ICD-10-CM | POA: Diagnosis not present

## 2023-09-20 DIAGNOSIS — R296 Repeated falls: Secondary | ICD-10-CM | POA: Diagnosis not present

## 2023-09-20 DIAGNOSIS — R278 Other lack of coordination: Secondary | ICD-10-CM | POA: Diagnosis not present

## 2023-09-20 DIAGNOSIS — M6281 Muscle weakness (generalized): Secondary | ICD-10-CM | POA: Diagnosis not present

## 2023-09-20 DIAGNOSIS — R41841 Cognitive communication deficit: Secondary | ICD-10-CM | POA: Diagnosis not present

## 2023-09-21 DIAGNOSIS — M6282 Rhabdomyolysis: Secondary | ICD-10-CM | POA: Diagnosis not present

## 2023-09-21 DIAGNOSIS — R2689 Other abnormalities of gait and mobility: Secondary | ICD-10-CM | POA: Diagnosis not present

## 2023-09-21 DIAGNOSIS — R41841 Cognitive communication deficit: Secondary | ICD-10-CM | POA: Diagnosis not present

## 2023-09-21 DIAGNOSIS — M6281 Muscle weakness (generalized): Secondary | ICD-10-CM | POA: Diagnosis not present

## 2023-09-21 DIAGNOSIS — R278 Other lack of coordination: Secondary | ICD-10-CM | POA: Diagnosis not present

## 2023-09-22 DIAGNOSIS — R2689 Other abnormalities of gait and mobility: Secondary | ICD-10-CM | POA: Diagnosis not present

## 2023-09-22 DIAGNOSIS — M6281 Muscle weakness (generalized): Secondary | ICD-10-CM | POA: Diagnosis not present

## 2023-09-22 DIAGNOSIS — R41841 Cognitive communication deficit: Secondary | ICD-10-CM | POA: Diagnosis not present

## 2023-09-22 DIAGNOSIS — M6282 Rhabdomyolysis: Secondary | ICD-10-CM | POA: Diagnosis not present

## 2023-09-22 DIAGNOSIS — R278 Other lack of coordination: Secondary | ICD-10-CM | POA: Diagnosis not present

## 2023-09-23 DIAGNOSIS — R41841 Cognitive communication deficit: Secondary | ICD-10-CM | POA: Diagnosis not present

## 2023-09-23 DIAGNOSIS — M6281 Muscle weakness (generalized): Secondary | ICD-10-CM | POA: Diagnosis not present

## 2023-09-23 DIAGNOSIS — M6282 Rhabdomyolysis: Secondary | ICD-10-CM | POA: Diagnosis not present

## 2023-09-23 DIAGNOSIS — R2689 Other abnormalities of gait and mobility: Secondary | ICD-10-CM | POA: Diagnosis not present

## 2023-09-23 DIAGNOSIS — R278 Other lack of coordination: Secondary | ICD-10-CM | POA: Diagnosis not present

## 2023-09-24 DIAGNOSIS — R2689 Other abnormalities of gait and mobility: Secondary | ICD-10-CM | POA: Diagnosis not present

## 2023-09-24 DIAGNOSIS — R278 Other lack of coordination: Secondary | ICD-10-CM | POA: Diagnosis not present

## 2023-09-24 DIAGNOSIS — R41841 Cognitive communication deficit: Secondary | ICD-10-CM | POA: Diagnosis not present

## 2023-09-24 DIAGNOSIS — M6282 Rhabdomyolysis: Secondary | ICD-10-CM | POA: Diagnosis not present

## 2023-09-24 DIAGNOSIS — M6281 Muscle weakness (generalized): Secondary | ICD-10-CM | POA: Diagnosis not present

## 2023-09-25 DIAGNOSIS — R278 Other lack of coordination: Secondary | ICD-10-CM | POA: Diagnosis not present

## 2023-09-25 DIAGNOSIS — R2689 Other abnormalities of gait and mobility: Secondary | ICD-10-CM | POA: Diagnosis not present

## 2023-09-25 DIAGNOSIS — M6281 Muscle weakness (generalized): Secondary | ICD-10-CM | POA: Diagnosis not present

## 2023-09-25 DIAGNOSIS — R41841 Cognitive communication deficit: Secondary | ICD-10-CM | POA: Diagnosis not present

## 2023-09-25 DIAGNOSIS — M6282 Rhabdomyolysis: Secondary | ICD-10-CM | POA: Diagnosis not present

## 2023-09-25 NOTE — ED Provider Notes (Signed)
[place] EMERGENCY DEPARTMENT AT [facility] Provider Note   CSN: [id number] Arrival date & time: 09/19/23  1323     History  Chief Complaint  Patient presents with   Pamela Patton is a 77 y.o. female.  HPI    77 y.o. female with medical history significant for Parkinson's disease, type 2 diabetes mellitus, send hypertension, hyperlipidemia, acquired hypothyroidism, anemia of chronic disease comes to the emergency room with chief complaint of unwitnessed fall at the nursing home.  Patient was rolling on her bed and fell off.  She ended up hitting her head against the table.  She is complaining of left-sided headache, hip pain.  Patient is not ambulatory at baseline.   Home Medications Prior to Admission medications   Medication Sig Start Date End Date Taking? Authorizing Provider  acetaminophen (TYLENOL) 325 MG tablet Take 2 tablets (650 mg total) by mouth every 6 (six) hours as needed for mild pain (pain score 1-3) (or Fever >/= 101). 08/29/23   Rodolph Bong, MD  amLODipine (NORVASC) 5 MG tablet Take 1 tablet (5 mg total) by mouth daily. 08/30/23   Rodolph Bong, MD  aspirin 81 MG tablet Take 1 tablet (81 mg total) by mouth at bedtime. Ok to restart 11/20/22 11/19/22   [name], NP  atorvastatin (LIPITOR) 40 MG tablet Take 1 tablet (40 mg total) by mouth at bedtime. 10/03/23   Rodolph Bong, MD  Blood Pressure Monitoring (BLOOD PRESSURE CUFF) MISC 1 Product by Does not apply route as needed. 05/21/19   Terressa Koyanagi, DO  Calcium Carbonate-Vitamin D 600-400 MG-UNIT tablet Take 1 tablet by mouth 3 (three) times a week.     [provider]  carbidopa-levodopa (SINEMET IR) 25-100 MG tablet TAKE TWO TABLETS BY MOUTH EVERY MORNING and TAKE TWO TABLETS BY MOUTH AT NOON and TAKE TWO TABLETS BY MOUTH EVERYDAY AT BEDTIME 05/08/23   [name], [name], DO  Cholecalciferol (VITAMIN D PO) Take 5,000 Units by mouth 3 (three) times a week.      [provider]  Cyanocobalamin (VITAMIN B12 PO) Take 5,000 mcg by mouth 2 (two) times a week.    [provider]  Glucagon (GVOKE HYPOPEN 2-PACK) 1 MG/0.2ML SOAJ Inject into the skin as needed. 01/11/23   [provider]  glucose 4 GM chewable tablet Chew 1 tablet by mouth as needed for low blood sugar.    [provider]  glucose blood (ACCU-CHEK AVIVA PLUS) test strip Use to test blood sugar 4 times a day (Dx: E11.65) 12/25/19   [provider]  insulin glargine (LANTUS) 100 UNIT/ML injection Inject 0.05 mLs (5 Units total) into the skin every evening. Patient taking differently: Inject 5 Units into the skin at bedtime. 11/01/21   [name], MD  Insulin Syringe-Needle U-100 (INSULIN SYRINGE .3CC/31GX5/16") 31G X 5/16" 0.3 ML MISC Use as directed for Lantus injection once a day 12/25/19   [provider]  leptospermum manuka honey (MEDIHONEY) PSTE paste Apply 1 Application topically daily. Clean sacral wound with NS, apply Medihoney to wound bed daily, cover with dry gauze and silicone foam. May lift foam daily to replace Medihoney. Change foam q3 days and prn soiling. Interchangeable with TheraHoney Apply thin layer (3 mm) to wound. 08/30/23   Rodolph Bong, MD  levothyroxine (SYNTHROID, LEVOTHROID) 75 MCG tablet Take 75 mcg by mouth daily before breakfast.    [provider]  Darylene Price  100 UNIT/ML FlexPen Inject 3 Units into the skin 3 (three) times daily with meals. Sliding scale 101-120=1 unit 121-150=2 units 150-200=3 units 201-250= 4 units 251-300=5 units Over 300= units 11/09/22   [provider]  OneTouch Delica Lancets 33G MISC Use to test blood sugar 4 times a day (Dx: E11.65) 12/25/19   [provider]  polyethylene glycol (MIRALAX / GLYCOLAX) 17 g packet Take 17 g by mouth 2 (two) times daily. 08/29/23   Rodolph Bong, MD  senna-docusate (SENOKOT-S) 8.6-50 MG tablet Take 1 tablet by  mouth 2 (two) times daily. 08/29/23   Rodolph Bong, MD      Allergies    Patient has no known allergies.    Review of Systems   Review of Systems  All other systems reviewed and are negative.   Physical Exam Updated Vital Signs BP 139/67 (BP Location: Left Arm)   Pulse 83   Temp 99.7 F (37.6 C) (Oral)   Resp 16   Ht 5\' 6"  (1.676 m)   Wt 60.2 kg   SpO2 100%   BMI 21.42 kg/m  Physical Exam Vitals and nursing note reviewed.  Constitutional:      Appearance: She is well-developed.  HENT:     Head: Atraumatic.  Neck:     Comments: No midline c-spine tenderness, pt able to turn head to 45 degrees bilaterally without any pain and able to flex neck to the chest and extend without any pain or neurologic symptoms.  Cardiovascular:     Rate and Rhythm: Normal rate.  Pulmonary:     Effort: Pulmonary effort is normal.  Musculoskeletal:     Comments: Head to toe evaluation shows facial hematoma over the forehead, bilateral hip tenderness but patient able to tolerate passive range of motion.  Otherwise there is no other hematoma, bleeding of the scalp, no facial abrasions, no spine step offs, crepitus of the chest or neck, no tenderness to palpation of the bilateral upper and lower extremities, no gross deformities, no chest tenderness, no pelvic pain.   Skin:    General: Skin is warm and dry.  Neurological:     Mental Status: She is alert and oriented to person, place, and time.     ED Results / Procedures / Treatments   Labs (all labs ordered are listed, but only abnormal results are displayed) Labs Reviewed  CBG MONITORING, ED - Abnormal; Notable for the following components:      Result Value   Glucose-Capillary 165 (*)    All other components within normal limits    EKG None  Radiology No results found.  Procedures Procedures    Medications Ordered in ED Medications  acetaminophen (TYLENOL) tablet 1,000 mg (1,000 mg Oral Given 09/19/23 1745)    ED  Course/ Medical Decision Making/ A&P                                 Medical Decision Making Amount and/or Complexity of Data Reviewed Radiology: ordered.   77 year old patient comes in after sustaining what appears to be a mechanical fall. Pertinent past medical includes Parkinson's disease, anemia of chronic disease, generalized weakness and a recent admission to the hospital for weakness Collateral history provided by EMS.  Based on my history and exam, differential diagnosis includes: - Traumatic brain injury including intracranial hemorrhage - Long bone fractures - Contusions - Soft tissue injury - Concussion  Based  on the initial assessment, the following workup was initiated CT scan of the brain, bilateral hip x-rays  Results of the ER workup pending at this time.  Care signed out to incoming team.  Final Clinical Impression(s) / ED Diagnoses Final diagnoses:  Fall, initial encounter    Rx / DC Orders ED Discharge Orders     None         Derwood Kaplan, MD 09/25/23 [phone]

## 2023-09-26 DIAGNOSIS — M6281 Muscle weakness (generalized): Secondary | ICD-10-CM | POA: Diagnosis not present

## 2023-09-26 DIAGNOSIS — R2689 Other abnormalities of gait and mobility: Secondary | ICD-10-CM | POA: Diagnosis not present

## 2023-09-26 DIAGNOSIS — R278 Other lack of coordination: Secondary | ICD-10-CM | POA: Diagnosis not present

## 2023-09-26 DIAGNOSIS — R41841 Cognitive communication deficit: Secondary | ICD-10-CM | POA: Diagnosis not present

## 2023-09-26 DIAGNOSIS — M6282 Rhabdomyolysis: Secondary | ICD-10-CM | POA: Diagnosis not present

## 2023-09-26 DIAGNOSIS — L89153 Pressure ulcer of sacral region, stage 3: Secondary | ICD-10-CM | POA: Diagnosis not present

## 2023-09-26 DIAGNOSIS — L89150 Pressure ulcer of sacral region, unstageable: Secondary | ICD-10-CM | POA: Diagnosis not present

## 2023-09-28 DIAGNOSIS — D638 Anemia in other chronic diseases classified elsewhere: Secondary | ICD-10-CM | POA: Diagnosis not present

## 2023-09-28 DIAGNOSIS — E875 Hyperkalemia: Secondary | ICD-10-CM | POA: Diagnosis not present

## 2023-09-28 DIAGNOSIS — M6282 Rhabdomyolysis: Secondary | ICD-10-CM | POA: Diagnosis not present

## 2023-09-28 DIAGNOSIS — I10 Essential (primary) hypertension: Secondary | ICD-10-CM | POA: Diagnosis not present

## 2023-09-28 DIAGNOSIS — E870 Hyperosmolality and hypernatremia: Secondary | ICD-10-CM | POA: Diagnosis not present

## 2023-09-28 DIAGNOSIS — G20A1 Parkinson's disease without dyskinesia, without mention of fluctuations: Secondary | ICD-10-CM | POA: Diagnosis not present

## 2023-09-28 DIAGNOSIS — M6281 Muscle weakness (generalized): Secondary | ICD-10-CM | POA: Diagnosis not present

## 2023-09-28 DIAGNOSIS — R2689 Other abnormalities of gait and mobility: Secondary | ICD-10-CM | POA: Diagnosis not present

## 2023-09-28 DIAGNOSIS — E1165 Type 2 diabetes mellitus with hyperglycemia: Secondary | ICD-10-CM | POA: Diagnosis not present

## 2023-09-28 DIAGNOSIS — R41841 Cognitive communication deficit: Secondary | ICD-10-CM | POA: Diagnosis not present

## 2023-09-28 DIAGNOSIS — Z9181 History of falling: Secondary | ICD-10-CM | POA: Diagnosis not present

## 2023-09-28 DIAGNOSIS — R278 Other lack of coordination: Secondary | ICD-10-CM | POA: Diagnosis not present

## 2023-09-29 DIAGNOSIS — R278 Other lack of coordination: Secondary | ICD-10-CM | POA: Diagnosis not present

## 2023-09-29 DIAGNOSIS — R2689 Other abnormalities of gait and mobility: Secondary | ICD-10-CM | POA: Diagnosis not present

## 2023-09-29 DIAGNOSIS — M6281 Muscle weakness (generalized): Secondary | ICD-10-CM | POA: Diagnosis not present

## 2023-09-29 DIAGNOSIS — R41841 Cognitive communication deficit: Secondary | ICD-10-CM | POA: Diagnosis not present

## 2023-09-29 DIAGNOSIS — M6282 Rhabdomyolysis: Secondary | ICD-10-CM | POA: Diagnosis not present

## 2023-10-02 DIAGNOSIS — R600 Localized edema: Secondary | ICD-10-CM | POA: Diagnosis not present

## 2023-10-02 DIAGNOSIS — W19XXXA Unspecified fall, initial encounter: Secondary | ICD-10-CM | POA: Diagnosis not present

## 2023-10-02 DIAGNOSIS — M79605 Pain in left leg: Secondary | ICD-10-CM | POA: Diagnosis not present

## 2023-10-02 DIAGNOSIS — R609 Edema, unspecified: Secondary | ICD-10-CM | POA: Diagnosis not present

## 2023-10-02 DIAGNOSIS — M25552 Pain in left hip: Secondary | ICD-10-CM | POA: Diagnosis not present

## 2023-10-03 DIAGNOSIS — L89153 Pressure ulcer of sacral region, stage 3: Secondary | ICD-10-CM | POA: Diagnosis not present

## 2023-10-03 DIAGNOSIS — L89150 Pressure ulcer of sacral region, unstageable: Secondary | ICD-10-CM | POA: Diagnosis not present

## 2023-10-10 DIAGNOSIS — L89153 Pressure ulcer of sacral region, stage 3: Secondary | ICD-10-CM | POA: Diagnosis not present

## 2023-10-10 DIAGNOSIS — L89150 Pressure ulcer of sacral region, unstageable: Secondary | ICD-10-CM | POA: Diagnosis not present

## 2023-10-17 DIAGNOSIS — L89153 Pressure ulcer of sacral region, stage 3: Secondary | ICD-10-CM | POA: Diagnosis not present

## 2023-10-20 DIAGNOSIS — Z9181 History of falling: Secondary | ICD-10-CM | POA: Diagnosis not present

## 2023-10-20 DIAGNOSIS — M6281 Muscle weakness (generalized): Secondary | ICD-10-CM | POA: Diagnosis not present

## 2023-10-20 DIAGNOSIS — G20A1 Parkinson's disease without dyskinesia, without mention of fluctuations: Secondary | ICD-10-CM | POA: Diagnosis not present

## 2023-10-20 DIAGNOSIS — D638 Anemia in other chronic diseases classified elsewhere: Secondary | ICD-10-CM | POA: Diagnosis not present

## 2023-10-20 DIAGNOSIS — I10 Essential (primary) hypertension: Secondary | ICD-10-CM | POA: Diagnosis not present

## 2023-10-20 DIAGNOSIS — E1165 Type 2 diabetes mellitus with hyperglycemia: Secondary | ICD-10-CM | POA: Diagnosis not present

## 2023-10-25 DIAGNOSIS — M79604 Pain in right leg: Secondary | ICD-10-CM | POA: Diagnosis not present

## 2023-10-25 DIAGNOSIS — E119 Type 2 diabetes mellitus without complications: Secondary | ICD-10-CM | POA: Diagnosis not present

## 2023-10-25 DIAGNOSIS — R2241 Localized swelling, mass and lump, right lower limb: Secondary | ICD-10-CM | POA: Diagnosis not present

## 2023-10-25 DIAGNOSIS — G20A1 Parkinson's disease without dyskinesia, without mention of fluctuations: Secondary | ICD-10-CM | POA: Diagnosis not present

## 2023-10-25 DIAGNOSIS — L89153 Pressure ulcer of sacral region, stage 3: Secondary | ICD-10-CM | POA: Diagnosis not present

## 2023-10-25 DIAGNOSIS — F0280 Dementia in other diseases classified elsewhere without behavioral disturbance: Secondary | ICD-10-CM | POA: Diagnosis not present

## 2023-10-25 DIAGNOSIS — L8993 Pressure ulcer of unspecified site, stage 3: Secondary | ICD-10-CM | POA: Diagnosis not present

## 2023-10-27 DIAGNOSIS — E039 Hypothyroidism, unspecified: Secondary | ICD-10-CM | POA: Diagnosis not present

## 2023-10-27 DIAGNOSIS — M6281 Muscle weakness (generalized): Secondary | ICD-10-CM | POA: Diagnosis not present

## 2023-10-27 DIAGNOSIS — L89153 Pressure ulcer of sacral region, stage 3: Secondary | ICD-10-CM | POA: Diagnosis not present

## 2023-10-27 DIAGNOSIS — R54 Age-related physical debility: Secondary | ICD-10-CM | POA: Diagnosis not present

## 2023-10-27 DIAGNOSIS — W19XXXD Unspecified fall, subsequent encounter: Secondary | ICD-10-CM | POA: Diagnosis not present

## 2023-10-27 DIAGNOSIS — E119 Type 2 diabetes mellitus without complications: Secondary | ICD-10-CM | POA: Diagnosis not present

## 2023-10-27 DIAGNOSIS — G20A1 Parkinson's disease without dyskinesia, without mention of fluctuations: Secondary | ICD-10-CM | POA: Diagnosis not present

## 2023-10-27 DIAGNOSIS — I10 Essential (primary) hypertension: Secondary | ICD-10-CM | POA: Diagnosis not present

## 2023-10-30 DIAGNOSIS — G20A1 Parkinson's disease without dyskinesia, without mention of fluctuations: Secondary | ICD-10-CM | POA: Diagnosis not present

## 2023-10-30 DIAGNOSIS — E039 Hypothyroidism, unspecified: Secondary | ICD-10-CM | POA: Diagnosis not present

## 2023-10-30 DIAGNOSIS — M6281 Muscle weakness (generalized): Secondary | ICD-10-CM | POA: Diagnosis not present

## 2023-10-30 DIAGNOSIS — E119 Type 2 diabetes mellitus without complications: Secondary | ICD-10-CM | POA: Diagnosis not present

## 2023-10-30 DIAGNOSIS — I10 Essential (primary) hypertension: Secondary | ICD-10-CM | POA: Diagnosis not present

## 2023-10-30 DIAGNOSIS — W19XXXD Unspecified fall, subsequent encounter: Secondary | ICD-10-CM | POA: Diagnosis not present

## 2023-10-30 DIAGNOSIS — L89153 Pressure ulcer of sacral region, stage 3: Secondary | ICD-10-CM | POA: Diagnosis not present

## 2023-10-31 DIAGNOSIS — R4182 Altered mental status, unspecified: Secondary | ICD-10-CM | POA: Diagnosis not present

## 2023-10-31 DIAGNOSIS — E039 Hypothyroidism, unspecified: Secondary | ICD-10-CM | POA: Diagnosis not present

## 2023-10-31 DIAGNOSIS — Z993 Dependence on wheelchair: Secondary | ICD-10-CM | POA: Diagnosis not present

## 2023-10-31 DIAGNOSIS — G459 Transient cerebral ischemic attack, unspecified: Secondary | ICD-10-CM | POA: Diagnosis not present

## 2023-10-31 DIAGNOSIS — D649 Anemia, unspecified: Secondary | ICD-10-CM | POA: Diagnosis not present

## 2023-10-31 DIAGNOSIS — R42 Dizziness and giddiness: Secondary | ICD-10-CM | POA: Diagnosis not present

## 2023-10-31 DIAGNOSIS — E119 Type 2 diabetes mellitus without complications: Secondary | ICD-10-CM | POA: Diagnosis not present

## 2023-10-31 DIAGNOSIS — R509 Fever, unspecified: Secondary | ICD-10-CM | POA: Diagnosis not present

## 2023-10-31 DIAGNOSIS — I6782 Cerebral ischemia: Secondary | ICD-10-CM | POA: Diagnosis not present

## 2023-10-31 DIAGNOSIS — G20A1 Parkinson's disease without dyskinesia, without mention of fluctuations: Secondary | ICD-10-CM | POA: Diagnosis not present

## 2023-10-31 DIAGNOSIS — I10 Essential (primary) hypertension: Secondary | ICD-10-CM | POA: Diagnosis not present

## 2023-10-31 DIAGNOSIS — I517 Cardiomegaly: Secondary | ICD-10-CM | POA: Diagnosis not present

## 2023-10-31 DIAGNOSIS — E7800 Pure hypercholesterolemia, unspecified: Secondary | ICD-10-CM | POA: Diagnosis not present

## 2023-10-31 DIAGNOSIS — R531 Weakness: Secondary | ICD-10-CM | POA: Diagnosis not present

## 2023-10-31 DIAGNOSIS — Z794 Long term (current) use of insulin: Secondary | ICD-10-CM | POA: Diagnosis not present

## 2023-11-01 DIAGNOSIS — I10 Essential (primary) hypertension: Secondary | ICD-10-CM | POA: Diagnosis not present

## 2023-11-01 DIAGNOSIS — L8993 Pressure ulcer of unspecified site, stage 3: Secondary | ICD-10-CM | POA: Diagnosis not present

## 2023-11-01 DIAGNOSIS — W19XXXD Unspecified fall, subsequent encounter: Secondary | ICD-10-CM | POA: Diagnosis not present

## 2023-11-01 DIAGNOSIS — M6281 Muscle weakness (generalized): Secondary | ICD-10-CM | POA: Diagnosis not present

## 2023-11-01 DIAGNOSIS — R350 Frequency of micturition: Secondary | ICD-10-CM | POA: Diagnosis not present

## 2023-11-01 DIAGNOSIS — F0280 Dementia in other diseases classified elsewhere without behavioral disturbance: Secondary | ICD-10-CM | POA: Diagnosis not present

## 2023-11-01 DIAGNOSIS — R2241 Localized swelling, mass and lump, right lower limb: Secondary | ICD-10-CM | POA: Diagnosis not present

## 2023-11-01 DIAGNOSIS — E039 Hypothyroidism, unspecified: Secondary | ICD-10-CM | POA: Diagnosis not present

## 2023-11-01 DIAGNOSIS — G20A1 Parkinson's disease without dyskinesia, without mention of fluctuations: Secondary | ICD-10-CM | POA: Diagnosis not present

## 2023-11-01 DIAGNOSIS — E639 Nutritional deficiency, unspecified: Secondary | ICD-10-CM | POA: Diagnosis not present

## 2023-11-01 DIAGNOSIS — E119 Type 2 diabetes mellitus without complications: Secondary | ICD-10-CM | POA: Diagnosis not present

## 2023-11-01 DIAGNOSIS — L89153 Pressure ulcer of sacral region, stage 3: Secondary | ICD-10-CM | POA: Diagnosis not present

## 2023-11-03 DIAGNOSIS — M6281 Muscle weakness (generalized): Secondary | ICD-10-CM | POA: Diagnosis not present

## 2023-11-03 DIAGNOSIS — I10 Essential (primary) hypertension: Secondary | ICD-10-CM | POA: Diagnosis not present

## 2023-11-03 DIAGNOSIS — E108 Type 1 diabetes mellitus with unspecified complications: Secondary | ICD-10-CM | POA: Diagnosis not present

## 2023-11-03 DIAGNOSIS — L89153 Pressure ulcer of sacral region, stage 3: Secondary | ICD-10-CM | POA: Diagnosis not present

## 2023-11-03 DIAGNOSIS — G20A1 Parkinson's disease without dyskinesia, without mention of fluctuations: Secondary | ICD-10-CM | POA: Diagnosis not present

## 2023-11-03 DIAGNOSIS — W19XXXD Unspecified fall, subsequent encounter: Secondary | ICD-10-CM | POA: Diagnosis not present

## 2023-11-03 DIAGNOSIS — G20C Parkinsonism, unspecified: Secondary | ICD-10-CM | POA: Diagnosis not present

## 2023-11-03 DIAGNOSIS — E039 Hypothyroidism, unspecified: Secondary | ICD-10-CM | POA: Diagnosis not present

## 2023-11-03 DIAGNOSIS — E119 Type 2 diabetes mellitus without complications: Secondary | ICD-10-CM | POA: Diagnosis not present

## 2023-11-06 DIAGNOSIS — E119 Type 2 diabetes mellitus without complications: Secondary | ICD-10-CM | POA: Diagnosis not present

## 2023-11-06 DIAGNOSIS — I10 Essential (primary) hypertension: Secondary | ICD-10-CM | POA: Diagnosis not present

## 2023-11-06 DIAGNOSIS — G20A1 Parkinson's disease without dyskinesia, without mention of fluctuations: Secondary | ICD-10-CM | POA: Diagnosis not present

## 2023-11-06 DIAGNOSIS — M6281 Muscle weakness (generalized): Secondary | ICD-10-CM | POA: Diagnosis not present

## 2023-11-06 DIAGNOSIS — L89153 Pressure ulcer of sacral region, stage 3: Secondary | ICD-10-CM | POA: Diagnosis not present

## 2023-11-06 DIAGNOSIS — E039 Hypothyroidism, unspecified: Secondary | ICD-10-CM | POA: Diagnosis not present

## 2023-11-06 DIAGNOSIS — W19XXXD Unspecified fall, subsequent encounter: Secondary | ICD-10-CM | POA: Diagnosis not present

## 2023-11-08 DIAGNOSIS — L8993 Pressure ulcer of unspecified site, stage 3: Secondary | ICD-10-CM | POA: Diagnosis not present

## 2023-11-08 DIAGNOSIS — F0280 Dementia in other diseases classified elsewhere without behavioral disturbance: Secondary | ICD-10-CM | POA: Diagnosis not present

## 2023-11-08 DIAGNOSIS — L89153 Pressure ulcer of sacral region, stage 3: Secondary | ICD-10-CM | POA: Diagnosis not present

## 2023-11-08 DIAGNOSIS — E639 Nutritional deficiency, unspecified: Secondary | ICD-10-CM | POA: Diagnosis not present

## 2023-11-09 ENCOUNTER — Other Ambulatory Visit: Payer: Self-pay | Admitting: Interventional Radiology

## 2023-11-09 ENCOUNTER — Ambulatory Visit: Payer: Self-pay | Admitting: Neurology

## 2023-11-09 ENCOUNTER — Telehealth: Payer: Self-pay | Admitting: *Deleted

## 2023-11-09 DIAGNOSIS — L89153 Pressure ulcer of sacral region, stage 3: Secondary | ICD-10-CM | POA: Diagnosis not present

## 2023-11-09 DIAGNOSIS — W19XXXD Unspecified fall, subsequent encounter: Secondary | ICD-10-CM | POA: Diagnosis not present

## 2023-11-09 DIAGNOSIS — E119 Type 2 diabetes mellitus without complications: Secondary | ICD-10-CM | POA: Diagnosis not present

## 2023-11-09 DIAGNOSIS — G20A1 Parkinson's disease without dyskinesia, without mention of fluctuations: Secondary | ICD-10-CM | POA: Diagnosis not present

## 2023-11-09 DIAGNOSIS — N2889 Other specified disorders of kidney and ureter: Secondary | ICD-10-CM

## 2023-11-09 DIAGNOSIS — I10 Essential (primary) hypertension: Secondary | ICD-10-CM | POA: Diagnosis not present

## 2023-11-09 DIAGNOSIS — E039 Hypothyroidism, unspecified: Secondary | ICD-10-CM | POA: Diagnosis not present

## 2023-11-09 DIAGNOSIS — M6281 Muscle weakness (generalized): Secondary | ICD-10-CM | POA: Diagnosis not present

## 2023-11-09 NOTE — Telephone Encounter (Signed)
 Have tried to call Pamela Patton several times and left messages to scheduled her f/u with Dr Reva Castleman from [facility] 11-18-2022. She has not returned calls.[name]

## 2023-11-10 DIAGNOSIS — W19XXXD Unspecified fall, subsequent encounter: Secondary | ICD-10-CM | POA: Diagnosis not present

## 2023-11-10 DIAGNOSIS — E119 Type 2 diabetes mellitus without complications: Secondary | ICD-10-CM | POA: Diagnosis not present

## 2023-11-10 DIAGNOSIS — M6281 Muscle weakness (generalized): Secondary | ICD-10-CM | POA: Diagnosis not present

## 2023-11-10 DIAGNOSIS — E039 Hypothyroidism, unspecified: Secondary | ICD-10-CM | POA: Diagnosis not present

## 2023-11-10 DIAGNOSIS — L89153 Pressure ulcer of sacral region, stage 3: Secondary | ICD-10-CM | POA: Diagnosis not present

## 2023-11-10 DIAGNOSIS — I10 Essential (primary) hypertension: Secondary | ICD-10-CM | POA: Diagnosis not present

## 2023-11-10 DIAGNOSIS — G20A1 Parkinson's disease without dyskinesia, without mention of fluctuations: Secondary | ICD-10-CM | POA: Diagnosis not present

## 2023-11-13 DIAGNOSIS — E119 Type 2 diabetes mellitus without complications: Secondary | ICD-10-CM | POA: Diagnosis not present

## 2023-11-13 DIAGNOSIS — W19XXXD Unspecified fall, subsequent encounter: Secondary | ICD-10-CM | POA: Diagnosis not present

## 2023-11-13 DIAGNOSIS — M6281 Muscle weakness (generalized): Secondary | ICD-10-CM | POA: Diagnosis not present

## 2023-11-13 DIAGNOSIS — E039 Hypothyroidism, unspecified: Secondary | ICD-10-CM | POA: Diagnosis not present

## 2023-11-13 DIAGNOSIS — I10 Essential (primary) hypertension: Secondary | ICD-10-CM | POA: Diagnosis not present

## 2023-11-13 DIAGNOSIS — L89153 Pressure ulcer of sacral region, stage 3: Secondary | ICD-10-CM | POA: Diagnosis not present

## 2023-11-13 DIAGNOSIS — G20A1 Parkinson's disease without dyskinesia, without mention of fluctuations: Secondary | ICD-10-CM | POA: Diagnosis not present

## 2023-11-17 DIAGNOSIS — I10 Essential (primary) hypertension: Secondary | ICD-10-CM | POA: Diagnosis not present

## 2023-11-17 DIAGNOSIS — L89153 Pressure ulcer of sacral region, stage 3: Secondary | ICD-10-CM | POA: Diagnosis not present

## 2023-11-17 DIAGNOSIS — G20A1 Parkinson's disease without dyskinesia, without mention of fluctuations: Secondary | ICD-10-CM | POA: Diagnosis not present

## 2023-11-17 DIAGNOSIS — M6281 Muscle weakness (generalized): Secondary | ICD-10-CM | POA: Diagnosis not present

## 2023-11-17 DIAGNOSIS — W19XXXD Unspecified fall, subsequent encounter: Secondary | ICD-10-CM | POA: Diagnosis not present

## 2023-11-17 DIAGNOSIS — E119 Type 2 diabetes mellitus without complications: Secondary | ICD-10-CM | POA: Diagnosis not present

## 2023-11-17 DIAGNOSIS — E039 Hypothyroidism, unspecified: Secondary | ICD-10-CM | POA: Diagnosis not present

## 2023-11-20 DIAGNOSIS — I10 Essential (primary) hypertension: Secondary | ICD-10-CM | POA: Diagnosis not present

## 2023-11-20 DIAGNOSIS — M6281 Muscle weakness (generalized): Secondary | ICD-10-CM | POA: Diagnosis not present

## 2023-11-20 DIAGNOSIS — E119 Type 2 diabetes mellitus without complications: Secondary | ICD-10-CM | POA: Diagnosis not present

## 2023-11-20 DIAGNOSIS — L89153 Pressure ulcer of sacral region, stage 3: Secondary | ICD-10-CM | POA: Diagnosis not present

## 2023-11-20 DIAGNOSIS — E039 Hypothyroidism, unspecified: Secondary | ICD-10-CM | POA: Diagnosis not present

## 2023-11-20 DIAGNOSIS — G20A1 Parkinson's disease without dyskinesia, without mention of fluctuations: Secondary | ICD-10-CM | POA: Diagnosis not present

## 2023-11-20 DIAGNOSIS — W19XXXD Unspecified fall, subsequent encounter: Secondary | ICD-10-CM | POA: Diagnosis not present

## 2023-11-22 DIAGNOSIS — L89153 Pressure ulcer of sacral region, stage 3: Secondary | ICD-10-CM | POA: Diagnosis not present

## 2023-11-22 DIAGNOSIS — E639 Nutritional deficiency, unspecified: Secondary | ICD-10-CM | POA: Diagnosis not present

## 2023-11-22 DIAGNOSIS — F0280 Dementia in other diseases classified elsewhere without behavioral disturbance: Secondary | ICD-10-CM | POA: Diagnosis not present

## 2023-11-22 DIAGNOSIS — L8993 Pressure ulcer of unspecified site, stage 3: Secondary | ICD-10-CM | POA: Diagnosis not present

## 2023-11-23 DIAGNOSIS — E119 Type 2 diabetes mellitus without complications: Secondary | ICD-10-CM | POA: Diagnosis not present

## 2023-11-23 DIAGNOSIS — I10 Essential (primary) hypertension: Secondary | ICD-10-CM | POA: Diagnosis not present

## 2023-11-23 DIAGNOSIS — G20A1 Parkinson's disease without dyskinesia, without mention of fluctuations: Secondary | ICD-10-CM | POA: Diagnosis not present

## 2023-11-23 DIAGNOSIS — W19XXXD Unspecified fall, subsequent encounter: Secondary | ICD-10-CM | POA: Diagnosis not present

## 2023-11-23 DIAGNOSIS — M6281 Muscle weakness (generalized): Secondary | ICD-10-CM | POA: Diagnosis not present

## 2023-11-23 DIAGNOSIS — L89153 Pressure ulcer of sacral region, stage 3: Secondary | ICD-10-CM | POA: Diagnosis not present

## 2023-11-23 DIAGNOSIS — E039 Hypothyroidism, unspecified: Secondary | ICD-10-CM | POA: Diagnosis not present

## 2023-11-24 DIAGNOSIS — I10 Essential (primary) hypertension: Secondary | ICD-10-CM | POA: Diagnosis not present

## 2023-11-24 DIAGNOSIS — G20A1 Parkinson's disease without dyskinesia, without mention of fluctuations: Secondary | ICD-10-CM | POA: Diagnosis not present

## 2023-11-24 DIAGNOSIS — E039 Hypothyroidism, unspecified: Secondary | ICD-10-CM | POA: Diagnosis not present

## 2023-11-24 DIAGNOSIS — L89153 Pressure ulcer of sacral region, stage 3: Secondary | ICD-10-CM | POA: Diagnosis not present

## 2023-11-24 DIAGNOSIS — M6281 Muscle weakness (generalized): Secondary | ICD-10-CM | POA: Diagnosis not present

## 2023-11-24 DIAGNOSIS — E119 Type 2 diabetes mellitus without complications: Secondary | ICD-10-CM | POA: Diagnosis not present

## 2023-11-24 DIAGNOSIS — W19XXXD Unspecified fall, subsequent encounter: Secondary | ICD-10-CM | POA: Diagnosis not present

## 2023-11-27 DIAGNOSIS — E039 Hypothyroidism, unspecified: Secondary | ICD-10-CM | POA: Diagnosis not present

## 2023-11-27 DIAGNOSIS — L89153 Pressure ulcer of sacral region, stage 3: Secondary | ICD-10-CM | POA: Diagnosis not present

## 2023-11-27 DIAGNOSIS — M6281 Muscle weakness (generalized): Secondary | ICD-10-CM | POA: Diagnosis not present

## 2023-11-27 DIAGNOSIS — E119 Type 2 diabetes mellitus without complications: Secondary | ICD-10-CM | POA: Diagnosis not present

## 2023-11-27 DIAGNOSIS — I10 Essential (primary) hypertension: Secondary | ICD-10-CM | POA: Diagnosis not present

## 2023-11-27 DIAGNOSIS — G20A1 Parkinson's disease without dyskinesia, without mention of fluctuations: Secondary | ICD-10-CM | POA: Diagnosis not present

## 2023-11-27 DIAGNOSIS — W19XXXD Unspecified fall, subsequent encounter: Secondary | ICD-10-CM | POA: Diagnosis not present

## 2023-11-29 DIAGNOSIS — G20A1 Parkinson's disease without dyskinesia, without mention of fluctuations: Secondary | ICD-10-CM | POA: Diagnosis not present

## 2023-11-29 DIAGNOSIS — I10 Essential (primary) hypertension: Secondary | ICD-10-CM | POA: Diagnosis not present

## 2023-11-29 DIAGNOSIS — E119 Type 2 diabetes mellitus without complications: Secondary | ICD-10-CM | POA: Diagnosis not present

## 2023-11-29 DIAGNOSIS — E039 Hypothyroidism, unspecified: Secondary | ICD-10-CM | POA: Diagnosis not present

## 2023-11-29 DIAGNOSIS — M6281 Muscle weakness (generalized): Secondary | ICD-10-CM | POA: Diagnosis not present

## 2023-11-29 DIAGNOSIS — W19XXXD Unspecified fall, subsequent encounter: Secondary | ICD-10-CM | POA: Diagnosis not present

## 2023-11-29 DIAGNOSIS — L89153 Pressure ulcer of sacral region, stage 3: Secondary | ICD-10-CM | POA: Diagnosis not present

## 2023-12-01 DIAGNOSIS — W19XXXD Unspecified fall, subsequent encounter: Secondary | ICD-10-CM | POA: Diagnosis not present

## 2023-12-01 DIAGNOSIS — E039 Hypothyroidism, unspecified: Secondary | ICD-10-CM | POA: Diagnosis not present

## 2023-12-01 DIAGNOSIS — I10 Essential (primary) hypertension: Secondary | ICD-10-CM | POA: Diagnosis not present

## 2023-12-01 DIAGNOSIS — M6281 Muscle weakness (generalized): Secondary | ICD-10-CM | POA: Diagnosis not present

## 2023-12-01 DIAGNOSIS — L89153 Pressure ulcer of sacral region, stage 3: Secondary | ICD-10-CM | POA: Diagnosis not present

## 2023-12-01 DIAGNOSIS — E119 Type 2 diabetes mellitus without complications: Secondary | ICD-10-CM | POA: Diagnosis not present

## 2023-12-01 DIAGNOSIS — G20A1 Parkinson's disease without dyskinesia, without mention of fluctuations: Secondary | ICD-10-CM | POA: Diagnosis not present

## 2023-12-04 DIAGNOSIS — E119 Type 2 diabetes mellitus without complications: Secondary | ICD-10-CM | POA: Diagnosis not present

## 2023-12-04 DIAGNOSIS — G20A1 Parkinson's disease without dyskinesia, without mention of fluctuations: Secondary | ICD-10-CM | POA: Diagnosis not present

## 2023-12-04 DIAGNOSIS — M6281 Muscle weakness (generalized): Secondary | ICD-10-CM | POA: Diagnosis not present

## 2023-12-04 DIAGNOSIS — W19XXXD Unspecified fall, subsequent encounter: Secondary | ICD-10-CM | POA: Diagnosis not present

## 2023-12-04 DIAGNOSIS — I10 Essential (primary) hypertension: Secondary | ICD-10-CM | POA: Diagnosis not present

## 2023-12-04 DIAGNOSIS — L89153 Pressure ulcer of sacral region, stage 3: Secondary | ICD-10-CM | POA: Diagnosis not present

## 2023-12-04 DIAGNOSIS — E039 Hypothyroidism, unspecified: Secondary | ICD-10-CM | POA: Diagnosis not present

## 2023-12-05 DIAGNOSIS — E119 Type 2 diabetes mellitus without complications: Secondary | ICD-10-CM | POA: Diagnosis not present

## 2023-12-05 DIAGNOSIS — G20B1 Parkinson's disease with dyskinesia, without mention of fluctuations: Secondary | ICD-10-CM | POA: Diagnosis not present

## 2023-12-05 DIAGNOSIS — G459 Transient cerebral ischemic attack, unspecified: Secondary | ICD-10-CM | POA: Diagnosis not present

## 2023-12-05 DIAGNOSIS — N183 Chronic kidney disease, stage 3 unspecified: Secondary | ICD-10-CM | POA: Diagnosis not present

## 2023-12-05 DIAGNOSIS — D649 Anemia, unspecified: Secondary | ICD-10-CM | POA: Diagnosis not present

## 2023-12-05 DIAGNOSIS — L89152 Pressure ulcer of sacral region, stage 2: Secondary | ICD-10-CM | POA: Diagnosis not present

## 2023-12-05 DIAGNOSIS — I10 Essential (primary) hypertension: Secondary | ICD-10-CM | POA: Diagnosis not present

## 2023-12-06 DIAGNOSIS — W19XXXD Unspecified fall, subsequent encounter: Secondary | ICD-10-CM | POA: Diagnosis not present

## 2023-12-06 DIAGNOSIS — E039 Hypothyroidism, unspecified: Secondary | ICD-10-CM | POA: Diagnosis not present

## 2023-12-06 DIAGNOSIS — M6281 Muscle weakness (generalized): Secondary | ICD-10-CM | POA: Diagnosis not present

## 2023-12-06 DIAGNOSIS — E119 Type 2 diabetes mellitus without complications: Secondary | ICD-10-CM | POA: Diagnosis not present

## 2023-12-06 DIAGNOSIS — I10 Essential (primary) hypertension: Secondary | ICD-10-CM | POA: Diagnosis not present

## 2023-12-06 DIAGNOSIS — E639 Nutritional deficiency, unspecified: Secondary | ICD-10-CM | POA: Diagnosis not present

## 2023-12-06 DIAGNOSIS — L8993 Pressure ulcer of unspecified site, stage 3: Secondary | ICD-10-CM | POA: Diagnosis not present

## 2023-12-06 DIAGNOSIS — G20A1 Parkinson's disease without dyskinesia, without mention of fluctuations: Secondary | ICD-10-CM | POA: Diagnosis not present

## 2023-12-06 DIAGNOSIS — F0280 Dementia in other diseases classified elsewhere without behavioral disturbance: Secondary | ICD-10-CM | POA: Diagnosis not present

## 2023-12-06 DIAGNOSIS — L89153 Pressure ulcer of sacral region, stage 3: Secondary | ICD-10-CM | POA: Diagnosis not present

## 2023-12-08 DIAGNOSIS — W19XXXD Unspecified fall, subsequent encounter: Secondary | ICD-10-CM | POA: Diagnosis not present

## 2023-12-08 DIAGNOSIS — L84 Corns and callosities: Secondary | ICD-10-CM | POA: Diagnosis not present

## 2023-12-08 DIAGNOSIS — E1142 Type 2 diabetes mellitus with diabetic polyneuropathy: Secondary | ICD-10-CM | POA: Diagnosis not present

## 2023-12-08 DIAGNOSIS — M6281 Muscle weakness (generalized): Secondary | ICD-10-CM | POA: Diagnosis not present

## 2023-12-08 DIAGNOSIS — L603 Nail dystrophy: Secondary | ICD-10-CM | POA: Diagnosis not present

## 2023-12-08 DIAGNOSIS — E119 Type 2 diabetes mellitus without complications: Secondary | ICD-10-CM | POA: Diagnosis not present

## 2023-12-08 DIAGNOSIS — E039 Hypothyroidism, unspecified: Secondary | ICD-10-CM | POA: Diagnosis not present

## 2023-12-08 DIAGNOSIS — G20A1 Parkinson's disease without dyskinesia, without mention of fluctuations: Secondary | ICD-10-CM | POA: Diagnosis not present

## 2023-12-08 DIAGNOSIS — Z9181 History of falling: Secondary | ICD-10-CM | POA: Diagnosis not present

## 2023-12-08 DIAGNOSIS — I10 Essential (primary) hypertension: Secondary | ICD-10-CM | POA: Diagnosis not present

## 2023-12-08 DIAGNOSIS — L89153 Pressure ulcer of sacral region, stage 3: Secondary | ICD-10-CM | POA: Diagnosis not present

## 2023-12-11 DIAGNOSIS — L89153 Pressure ulcer of sacral region, stage 3: Secondary | ICD-10-CM | POA: Diagnosis not present

## 2023-12-11 DIAGNOSIS — I10 Essential (primary) hypertension: Secondary | ICD-10-CM | POA: Diagnosis not present

## 2023-12-11 DIAGNOSIS — E039 Hypothyroidism, unspecified: Secondary | ICD-10-CM | POA: Diagnosis not present

## 2023-12-11 DIAGNOSIS — M6281 Muscle weakness (generalized): Secondary | ICD-10-CM | POA: Diagnosis not present

## 2023-12-11 DIAGNOSIS — W19XXXD Unspecified fall, subsequent encounter: Secondary | ICD-10-CM | POA: Diagnosis not present

## 2023-12-11 DIAGNOSIS — G20A1 Parkinson's disease without dyskinesia, without mention of fluctuations: Secondary | ICD-10-CM | POA: Diagnosis not present

## 2023-12-11 DIAGNOSIS — E119 Type 2 diabetes mellitus without complications: Secondary | ICD-10-CM | POA: Diagnosis not present

## 2023-12-12 DIAGNOSIS — E559 Vitamin D deficiency, unspecified: Secondary | ICD-10-CM | POA: Diagnosis not present

## 2023-12-12 DIAGNOSIS — E109 Type 1 diabetes mellitus without complications: Secondary | ICD-10-CM | POA: Diagnosis not present

## 2023-12-12 DIAGNOSIS — E785 Hyperlipidemia, unspecified: Secondary | ICD-10-CM | POA: Diagnosis not present

## 2023-12-12 DIAGNOSIS — E119 Type 2 diabetes mellitus without complications: Secondary | ICD-10-CM | POA: Diagnosis not present

## 2023-12-12 DIAGNOSIS — E039 Hypothyroidism, unspecified: Secondary | ICD-10-CM | POA: Diagnosis not present

## 2023-12-12 DIAGNOSIS — I10 Essential (primary) hypertension: Secondary | ICD-10-CM | POA: Diagnosis not present

## 2023-12-13 DIAGNOSIS — I10 Essential (primary) hypertension: Secondary | ICD-10-CM | POA: Diagnosis not present

## 2023-12-13 DIAGNOSIS — F411 Generalized anxiety disorder: Secondary | ICD-10-CM | POA: Diagnosis not present

## 2023-12-13 DIAGNOSIS — E119 Type 2 diabetes mellitus without complications: Secondary | ICD-10-CM | POA: Diagnosis not present

## 2023-12-13 DIAGNOSIS — G20B1 Parkinson's disease with dyskinesia, without mention of fluctuations: Secondary | ICD-10-CM | POA: Diagnosis not present

## 2023-12-14 DIAGNOSIS — E039 Hypothyroidism, unspecified: Secondary | ICD-10-CM | POA: Diagnosis not present

## 2023-12-14 DIAGNOSIS — L89153 Pressure ulcer of sacral region, stage 3: Secondary | ICD-10-CM | POA: Diagnosis not present

## 2023-12-14 DIAGNOSIS — I10 Essential (primary) hypertension: Secondary | ICD-10-CM | POA: Diagnosis not present

## 2023-12-14 DIAGNOSIS — E119 Type 2 diabetes mellitus without complications: Secondary | ICD-10-CM | POA: Diagnosis not present

## 2023-12-14 DIAGNOSIS — W19XXXD Unspecified fall, subsequent encounter: Secondary | ICD-10-CM | POA: Diagnosis not present

## 2023-12-14 DIAGNOSIS — G20A1 Parkinson's disease without dyskinesia, without mention of fluctuations: Secondary | ICD-10-CM | POA: Diagnosis not present

## 2023-12-14 DIAGNOSIS — M6281 Muscle weakness (generalized): Secondary | ICD-10-CM | POA: Diagnosis not present

## 2023-12-15 DIAGNOSIS — L89153 Pressure ulcer of sacral region, stage 3: Secondary | ICD-10-CM | POA: Diagnosis not present

## 2023-12-15 DIAGNOSIS — E119 Type 2 diabetes mellitus without complications: Secondary | ICD-10-CM | POA: Diagnosis not present

## 2023-12-15 DIAGNOSIS — I10 Essential (primary) hypertension: Secondary | ICD-10-CM | POA: Diagnosis not present

## 2023-12-15 DIAGNOSIS — G20A1 Parkinson's disease without dyskinesia, without mention of fluctuations: Secondary | ICD-10-CM | POA: Diagnosis not present

## 2023-12-15 DIAGNOSIS — W19XXXD Unspecified fall, subsequent encounter: Secondary | ICD-10-CM | POA: Diagnosis not present

## 2023-12-15 DIAGNOSIS — E039 Hypothyroidism, unspecified: Secondary | ICD-10-CM | POA: Diagnosis not present

## 2023-12-15 DIAGNOSIS — M6281 Muscle weakness (generalized): Secondary | ICD-10-CM | POA: Diagnosis not present

## 2023-12-18 DIAGNOSIS — I10 Essential (primary) hypertension: Secondary | ICD-10-CM | POA: Diagnosis not present

## 2023-12-18 DIAGNOSIS — G20A1 Parkinson's disease without dyskinesia, without mention of fluctuations: Secondary | ICD-10-CM | POA: Diagnosis not present

## 2023-12-18 DIAGNOSIS — W19XXXD Unspecified fall, subsequent encounter: Secondary | ICD-10-CM | POA: Diagnosis not present

## 2023-12-18 DIAGNOSIS — M6281 Muscle weakness (generalized): Secondary | ICD-10-CM | POA: Diagnosis not present

## 2023-12-18 DIAGNOSIS — E039 Hypothyroidism, unspecified: Secondary | ICD-10-CM | POA: Diagnosis not present

## 2023-12-18 DIAGNOSIS — E119 Type 2 diabetes mellitus without complications: Secondary | ICD-10-CM | POA: Diagnosis not present

## 2023-12-18 DIAGNOSIS — L89153 Pressure ulcer of sacral region, stage 3: Secondary | ICD-10-CM | POA: Diagnosis not present

## 2023-12-20 DIAGNOSIS — R2689 Other abnormalities of gait and mobility: Secondary | ICD-10-CM | POA: Diagnosis not present

## 2023-12-20 DIAGNOSIS — G20C Parkinsonism, unspecified: Secondary | ICD-10-CM | POA: Diagnosis not present

## 2023-12-20 DIAGNOSIS — M6281 Muscle weakness (generalized): Secondary | ICD-10-CM | POA: Diagnosis not present

## 2023-12-20 DIAGNOSIS — F32A Depression, unspecified: Secondary | ICD-10-CM | POA: Diagnosis not present

## 2023-12-21 DIAGNOSIS — I10 Essential (primary) hypertension: Secondary | ICD-10-CM | POA: Diagnosis not present

## 2023-12-21 DIAGNOSIS — W19XXXD Unspecified fall, subsequent encounter: Secondary | ICD-10-CM | POA: Diagnosis not present

## 2023-12-21 DIAGNOSIS — E119 Type 2 diabetes mellitus without complications: Secondary | ICD-10-CM | POA: Diagnosis not present

## 2023-12-21 DIAGNOSIS — E039 Hypothyroidism, unspecified: Secondary | ICD-10-CM | POA: Diagnosis not present

## 2023-12-21 DIAGNOSIS — G20A1 Parkinson's disease without dyskinesia, without mention of fluctuations: Secondary | ICD-10-CM | POA: Diagnosis not present

## 2023-12-21 DIAGNOSIS — E108 Type 1 diabetes mellitus with unspecified complications: Secondary | ICD-10-CM | POA: Diagnosis not present

## 2023-12-21 DIAGNOSIS — L89153 Pressure ulcer of sacral region, stage 3: Secondary | ICD-10-CM | POA: Diagnosis not present

## 2023-12-21 DIAGNOSIS — G20C Parkinsonism, unspecified: Secondary | ICD-10-CM | POA: Diagnosis not present

## 2023-12-21 DIAGNOSIS — M6281 Muscle weakness (generalized): Secondary | ICD-10-CM | POA: Diagnosis not present

## 2023-12-22 DIAGNOSIS — E039 Hypothyroidism, unspecified: Secondary | ICD-10-CM | POA: Diagnosis not present

## 2023-12-22 DIAGNOSIS — E1165 Type 2 diabetes mellitus with hyperglycemia: Secondary | ICD-10-CM | POA: Diagnosis not present

## 2023-12-22 DIAGNOSIS — E119 Type 2 diabetes mellitus without complications: Secondary | ICD-10-CM | POA: Diagnosis not present

## 2023-12-22 DIAGNOSIS — W19XXXD Unspecified fall, subsequent encounter: Secondary | ICD-10-CM | POA: Diagnosis not present

## 2023-12-22 DIAGNOSIS — G20A1 Parkinson's disease without dyskinesia, without mention of fluctuations: Secondary | ICD-10-CM | POA: Diagnosis not present

## 2023-12-22 DIAGNOSIS — M6281 Muscle weakness (generalized): Secondary | ICD-10-CM | POA: Diagnosis not present

## 2023-12-22 DIAGNOSIS — L89153 Pressure ulcer of sacral region, stage 3: Secondary | ICD-10-CM | POA: Diagnosis not present

## 2023-12-22 DIAGNOSIS — I10 Essential (primary) hypertension: Secondary | ICD-10-CM | POA: Diagnosis not present

## 2023-12-26 DIAGNOSIS — N183 Chronic kidney disease, stage 3 unspecified: Secondary | ICD-10-CM | POA: Diagnosis not present

## 2023-12-26 DIAGNOSIS — E559 Vitamin D deficiency, unspecified: Secondary | ICD-10-CM | POA: Diagnosis not present

## 2023-12-26 DIAGNOSIS — D649 Anemia, unspecified: Secondary | ICD-10-CM | POA: Diagnosis not present

## 2023-12-26 DIAGNOSIS — E109 Type 1 diabetes mellitus without complications: Secondary | ICD-10-CM | POA: Diagnosis not present

## 2023-12-26 DIAGNOSIS — E612 Magnesium deficiency: Secondary | ICD-10-CM | POA: Diagnosis not present

## 2023-12-26 DIAGNOSIS — K5900 Constipation, unspecified: Secondary | ICD-10-CM | POA: Diagnosis not present

## 2023-12-26 DIAGNOSIS — I10 Essential (primary) hypertension: Secondary | ICD-10-CM | POA: Diagnosis not present

## 2023-12-26 DIAGNOSIS — G20B1 Parkinson's disease with dyskinesia, without mention of fluctuations: Secondary | ICD-10-CM | POA: Diagnosis not present

## 2024-01-04 ENCOUNTER — Telehealth: Payer: Self-pay | Admitting: *Deleted

## 2024-01-04 NOTE — Telephone Encounter (Signed)
 Copied from CRM [id number]. Topic: Appointments - Appointment Info/Confirmation >> Jan 04, 2024 12:07 PM Pamela Patton wrote: Patient is calling in regards to appointment for tomorrow. Patient is out of town and wanted to be contacted on a different number for the AWV. Phone number is [phone] and I also updated that in the appointment notes.

## 2024-01-05 ENCOUNTER — Telehealth: Payer: Self-pay

## 2024-01-05 NOTE — Telephone Encounter (Signed)
 Unsuccessful attempts to reach patient on preferred number listed in notes for scheduled AWV. Left message on voicemail okay to reschedule.

## 2024-01-11 DIAGNOSIS — G20A2 Parkinson's disease without dyskinesia, with fluctuations: Secondary | ICD-10-CM | POA: Diagnosis not present

## 2024-01-11 DIAGNOSIS — L89152 Pressure ulcer of sacral region, stage 2: Secondary | ICD-10-CM | POA: Diagnosis not present

## 2024-01-11 DIAGNOSIS — E109 Type 1 diabetes mellitus without complications: Secondary | ICD-10-CM | POA: Diagnosis not present

## 2024-01-11 DIAGNOSIS — K5900 Constipation, unspecified: Secondary | ICD-10-CM | POA: Diagnosis not present

## 2024-01-11 DIAGNOSIS — I10 Essential (primary) hypertension: Secondary | ICD-10-CM | POA: Diagnosis not present

## 2024-01-11 DIAGNOSIS — N183 Chronic kidney disease, stage 3 unspecified: Secondary | ICD-10-CM | POA: Diagnosis not present

## 2024-01-15 DIAGNOSIS — J302 Other seasonal allergic rhinitis: Secondary | ICD-10-CM | POA: Diagnosis not present

## 2024-01-15 DIAGNOSIS — R109 Unspecified abdominal pain: Secondary | ICD-10-CM | POA: Diagnosis not present

## 2024-01-15 DIAGNOSIS — G20A2 Parkinson's disease without dyskinesia, with fluctuations: Secondary | ICD-10-CM | POA: Diagnosis not present

## 2024-01-15 DIAGNOSIS — J301 Allergic rhinitis due to pollen: Secondary | ICD-10-CM | POA: Diagnosis not present

## 2024-01-15 DIAGNOSIS — E109 Type 1 diabetes mellitus without complications: Secondary | ICD-10-CM | POA: Diagnosis not present

## 2024-01-15 DIAGNOSIS — I10 Essential (primary) hypertension: Secondary | ICD-10-CM | POA: Diagnosis not present

## 2024-01-18 DIAGNOSIS — G459 Transient cerebral ischemic attack, unspecified: Secondary | ICD-10-CM | POA: Diagnosis not present

## 2024-01-18 DIAGNOSIS — J302 Other seasonal allergic rhinitis: Secondary | ICD-10-CM | POA: Diagnosis not present

## 2024-01-18 DIAGNOSIS — G20A1 Parkinson's disease without dyskinesia, without mention of fluctuations: Secondary | ICD-10-CM | POA: Diagnosis not present

## 2024-01-18 DIAGNOSIS — J301 Allergic rhinitis due to pollen: Secondary | ICD-10-CM | POA: Diagnosis not present

## 2024-01-18 DIAGNOSIS — M6281 Muscle weakness (generalized): Secondary | ICD-10-CM | POA: Diagnosis not present

## 2024-01-19 DIAGNOSIS — J301 Allergic rhinitis due to pollen: Secondary | ICD-10-CM | POA: Diagnosis not present

## 2024-01-19 DIAGNOSIS — J302 Other seasonal allergic rhinitis: Secondary | ICD-10-CM | POA: Diagnosis not present

## 2024-01-20 DIAGNOSIS — J301 Allergic rhinitis due to pollen: Secondary | ICD-10-CM | POA: Diagnosis not present

## 2024-01-20 DIAGNOSIS — J302 Other seasonal allergic rhinitis: Secondary | ICD-10-CM | POA: Diagnosis not present

## 2024-01-21 DIAGNOSIS — R2689 Other abnormalities of gait and mobility: Secondary | ICD-10-CM | POA: Diagnosis not present

## 2024-01-21 DIAGNOSIS — Z604 Social exclusion and rejection: Secondary | ICD-10-CM | POA: Diagnosis not present

## 2024-01-21 DIAGNOSIS — J301 Allergic rhinitis due to pollen: Secondary | ICD-10-CM | POA: Diagnosis not present

## 2024-01-21 DIAGNOSIS — F32A Depression, unspecified: Secondary | ICD-10-CM | POA: Diagnosis not present

## 2024-01-21 DIAGNOSIS — G20C Parkinsonism, unspecified: Secondary | ICD-10-CM | POA: Diagnosis not present

## 2024-01-21 DIAGNOSIS — J302 Other seasonal allergic rhinitis: Secondary | ICD-10-CM | POA: Diagnosis not present

## 2024-01-22 DIAGNOSIS — J302 Other seasonal allergic rhinitis: Secondary | ICD-10-CM | POA: Diagnosis not present

## 2024-01-22 DIAGNOSIS — J301 Allergic rhinitis due to pollen: Secondary | ICD-10-CM | POA: Diagnosis not present

## 2024-01-23 DIAGNOSIS — J301 Allergic rhinitis due to pollen: Secondary | ICD-10-CM | POA: Diagnosis not present

## 2024-01-23 DIAGNOSIS — J302 Other seasonal allergic rhinitis: Secondary | ICD-10-CM | POA: Diagnosis not present

## 2024-01-29 DIAGNOSIS — R001 Bradycardia, unspecified: Secondary | ICD-10-CM | POA: Diagnosis not present

## 2024-01-29 DIAGNOSIS — I951 Orthostatic hypotension: Secondary | ICD-10-CM | POA: Diagnosis not present

## 2024-01-31 DIAGNOSIS — G459 Transient cerebral ischemic attack, unspecified: Secondary | ICD-10-CM | POA: Diagnosis not present

## 2024-02-05 DIAGNOSIS — R001 Bradycardia, unspecified: Secondary | ICD-10-CM | POA: Diagnosis not present

## 2024-02-08 DIAGNOSIS — G20A1 Parkinson's disease without dyskinesia, without mention of fluctuations: Secondary | ICD-10-CM | POA: Diagnosis not present

## 2024-02-08 DIAGNOSIS — G459 Transient cerebral ischemic attack, unspecified: Secondary | ICD-10-CM | POA: Diagnosis not present

## 2024-02-12 DIAGNOSIS — E109 Type 1 diabetes mellitus without complications: Secondary | ICD-10-CM | POA: Diagnosis not present

## 2024-02-12 DIAGNOSIS — E559 Vitamin D deficiency, unspecified: Secondary | ICD-10-CM | POA: Diagnosis not present

## 2024-02-12 DIAGNOSIS — E039 Hypothyroidism, unspecified: Secondary | ICD-10-CM | POA: Diagnosis not present

## 2024-02-12 DIAGNOSIS — N183 Chronic kidney disease, stage 3 unspecified: Secondary | ICD-10-CM | POA: Diagnosis not present

## 2024-02-15 DIAGNOSIS — C642 Malignant neoplasm of left kidney, except renal pelvis: Secondary | ICD-10-CM | POA: Diagnosis not present

## 2024-02-15 DIAGNOSIS — E039 Hypothyroidism, unspecified: Secondary | ICD-10-CM | POA: Diagnosis not present

## 2024-02-15 DIAGNOSIS — K921 Melena: Secondary | ICD-10-CM | POA: Diagnosis not present

## 2024-02-15 DIAGNOSIS — N1832 Chronic kidney disease, stage 3b: Secondary | ICD-10-CM | POA: Diagnosis not present

## 2024-02-15 DIAGNOSIS — N1831 Chronic kidney disease, stage 3a: Secondary | ICD-10-CM | POA: Diagnosis not present

## 2024-02-16 DIAGNOSIS — E1142 Type 2 diabetes mellitus with diabetic polyneuropathy: Secondary | ICD-10-CM | POA: Diagnosis not present

## 2024-02-16 DIAGNOSIS — L84 Corns and callosities: Secondary | ICD-10-CM | POA: Diagnosis not present

## 2024-02-16 DIAGNOSIS — L603 Nail dystrophy: Secondary | ICD-10-CM | POA: Diagnosis not present

## 2024-02-17 DIAGNOSIS — M6281 Muscle weakness (generalized): Secondary | ICD-10-CM | POA: Diagnosis not present

## 2024-02-18 DIAGNOSIS — R001 Bradycardia, unspecified: Secondary | ICD-10-CM | POA: Diagnosis not present

## 2024-02-19 DIAGNOSIS — R001 Bradycardia, unspecified: Secondary | ICD-10-CM | POA: Diagnosis not present

## 2024-02-21 DIAGNOSIS — G20A1 Parkinson's disease without dyskinesia, without mention of fluctuations: Secondary | ICD-10-CM | POA: Diagnosis not present

## 2024-02-21 DIAGNOSIS — G459 Transient cerebral ischemic attack, unspecified: Secondary | ICD-10-CM | POA: Diagnosis not present

## 2024-03-11 DIAGNOSIS — K921 Melena: Secondary | ICD-10-CM | POA: Diagnosis not present

## 2024-03-14 DIAGNOSIS — G20A1 Parkinson's disease without dyskinesia, without mention of fluctuations: Secondary | ICD-10-CM | POA: Diagnosis not present

## 2024-03-14 DIAGNOSIS — E039 Hypothyroidism, unspecified: Secondary | ICD-10-CM | POA: Diagnosis not present

## 2024-03-14 DIAGNOSIS — E109 Type 1 diabetes mellitus without complications: Secondary | ICD-10-CM | POA: Diagnosis not present

## 2024-03-14 DIAGNOSIS — I10 Essential (primary) hypertension: Secondary | ICD-10-CM | POA: Diagnosis not present

## 2024-03-14 DIAGNOSIS — K921 Melena: Secondary | ICD-10-CM | POA: Diagnosis not present

## 2024-03-14 DIAGNOSIS — E559 Vitamin D deficiency, unspecified: Secondary | ICD-10-CM | POA: Diagnosis not present

## 2024-03-14 DIAGNOSIS — N1831 Chronic kidney disease, stage 3a: Secondary | ICD-10-CM | POA: Diagnosis not present

## 2024-03-14 DIAGNOSIS — I480 Paroxysmal atrial fibrillation: Secondary | ICD-10-CM | POA: Diagnosis not present

## 2024-03-19 DIAGNOSIS — M6281 Muscle weakness (generalized): Secondary | ICD-10-CM | POA: Diagnosis not present

## 2024-03-21 DIAGNOSIS — I951 Orthostatic hypotension: Secondary | ICD-10-CM | POA: Diagnosis not present

## 2024-03-21 DIAGNOSIS — E1165 Type 2 diabetes mellitus with hyperglycemia: Secondary | ICD-10-CM | POA: Diagnosis not present

## 2024-03-21 DIAGNOSIS — R001 Bradycardia, unspecified: Secondary | ICD-10-CM | POA: Diagnosis not present

## 2024-03-21 DIAGNOSIS — I480 Paroxysmal atrial fibrillation: Secondary | ICD-10-CM | POA: Diagnosis not present

## 2024-04-01 ENCOUNTER — Telehealth: Payer: Self-pay | Admitting: Pharmacist

## 2024-04-01 NOTE — Progress Notes (Signed)
 Patient was identified as falling into the True North Measure - Diabetes.   Patient was: Attribution and/or data issue.  Validation/Investigation needed.  Explanation:  Claims indicate she is seeing a provider in [place] 

## 2024-04-18 DIAGNOSIS — M6281 Muscle weakness (generalized): Secondary | ICD-10-CM | POA: Diagnosis not present

## 2024-05-01 ENCOUNTER — Ambulatory Visit (INDEPENDENT_AMBULATORY_CARE_PROVIDER_SITE_OTHER)

## 2024-05-01 VITALS — Ht 66.0 in | Wt 125.0 lb

## 2024-05-01 DIAGNOSIS — Z0000 Encounter for general adult medical examination without abnormal findings: Secondary | ICD-10-CM

## 2024-05-01 NOTE — Progress Notes (Signed)
 Subjective:   Pamela Patton is a 78 y.o. who presents for a Medicare Wellness preventive visit.  As a reminder, Annual Wellness Visits don't include a physical exam, and some assessments may be limited, especially if this visit is performed virtually. We may recommend an in-person follow-up visit with your provider if needed.  Visit Complete: Virtual I connected with  Pamela Patton on 05/01/24 by a audio enabled telemedicine application and verified that I am speaking with the correct person using two identifiers.  Patient Location: Home  Provider Location: Home Office  I discussed the limitations of evaluation and management by telemedicine. The patient expressed understanding and agreed to proceed.  Vital Signs: Because this visit was a virtual/telehealth visit, some criteria may be missing or patient reported. Any vitals not documented were not able to be obtained and vitals that have been documented are patient reported.    Persons Participating in Visit: Patient assisted by Son.  AWV Questionnaire: Yes: Patient Medicare AWV questionnaire was completed by the patient on 01/03/24; I have confirmed that all information answered by patient is correct and no changes since this date.  Cardiac Risk Factors include: advanced age (>56men, >69 women);diabetes mellitus;hypertension     Objective:    Today's Vitals   05/01/24 1128  Weight: 125 lb (56.7 kg)  Height: 5' 6 (1.676 m)   Body mass index is 20.18 kg/m.     05/01/2024   11:44 AM 09/19/2023    1:33 PM 08/21/2023   11:42 PM 08/21/2023    3:39 PM 05/04/2023   12:46 PM 01/18/2023   12:53 PM 01/02/2023    4:10 PM  Advanced Directives  Does Patient Have a Medical Advance Directive? Yes No No No Yes No No  Type of Estate agent of [place];Living will    Living will    Copy of Healthcare Power of Attorney in Chart? No - copy requested        Would patient like information on creating a medical advance  directive?   No - Patient declined   No - Patient declined     Current Medications (verified) Outpatient Encounter Medications as of 05/01/2024  Medication Sig   acetaminophen  (TYLENOL ) 325 MG tablet Take 2 tablets (650 mg total) by mouth every 6 (six) hours as needed for mild pain (pain score 1-3) (or Fever >/= 101).   amLODipine  (NORVASC ) 5 MG tablet Take 1 tablet (5 mg total) by mouth daily.   aspirin  81 MG tablet Take 1 tablet (81 mg total) by mouth at bedtime. Ok to restart 11/20/22 (Patient not taking: Reported on 05/01/2024)   atorvastatin  (LIPITOR) 40 MG tablet Take 1 tablet (40 mg total) by mouth at bedtime.   Blood Pressure Monitoring (BLOOD PRESSURE CUFF) MISC 1 Product by Does not apply route as needed.   Calcium  Carbonate-Vitamin D 600-400 MG-UNIT tablet Take 1 tablet by mouth 3 (three) times a week.    carbidopa -levodopa  (SINEMET  IR) 25-100 MG tablet TAKE TWO TABLETS BY MOUTH EVERY MORNING and TAKE TWO TABLETS BY MOUTH AT NOON and TAKE TWO TABLETS BY MOUTH EVERYDAY AT BEDTIME   Cholecalciferol (VITAMIN D PO) Take 5,000 Units by mouth 3 (three) times a week.    Cyanocobalamin  (VITAMIN B12 PO) Take 5,000 mcg by mouth 2 (two) times a week.   Glucagon (GVOKE HYPOPEN 2-PACK) 1 MG/0.2ML SOAJ Inject into the skin as needed.   glucose 4 GM chewable tablet Chew 1 tablet by mouth as needed for low  blood sugar.   glucose blood (ACCU-CHEK AVIVA PLUS) test strip Use to test blood sugar 4 times a day (Dx: E11.65)   insulin  glargine (LANTUS ) 100 UNIT/ML injection Inject 0.05 mLs (5 Units total) into the skin every evening. (Patient taking differently: Inject 5 Units into the skin at bedtime.)   Insulin  Syringe-Needle U-100 (INSULIN  SYRINGE .3CC/31GX5/16) 31G X 5/16 0.3 ML MISC Use as directed for Lantus  injection once a day   leptospermum manuka honey (MEDIHONEY) PSTE paste Apply 1 Application topically daily. Clean sacral wound with NS, apply Medihoney to wound bed daily, cover with dry gauze and  silicone foam. May lift foam daily to replace Medihoney. Change foam q3 days and prn soiling. Interchangeable with TheraHoney Apply thin layer (3 mm) to wound.   levothyroxine  (SYNTHROID , LEVOTHROID) 75 MCG tablet Take 75 mcg by mouth daily before breakfast.   NOVOLOG  FLEXPEN 100 UNIT/ML FlexPen Inject 3 Units into the skin 3 (three) times daily with meals. Sliding scale 101-120=1 unit 121-150=2 units 150-200=3 units 201-250= 4 units 251-300=5 units Over 300= units   OneTouch Delica Lancets 33G MISC Use to test blood sugar 4 times a day (Dx: E11.65)   polyethylene glycol (MIRALAX  / GLYCOLAX ) 17 g packet Take 17 g by mouth 2 (two) times daily.   senna-docusate (SENOKOT-S) 8.6-50 MG tablet Take 1 tablet by mouth 2 (two) times daily.   No facility-administered encounter medications on file as of 05/01/2024.    Allergies (verified) Patient has no known allergies.   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   Fracture    radial/ulnar 03/2016   GERD (gastroesophageal reflux disease)    Heart murmur    innocent murmur   High cholesterol    Hypertension associated with diabetes 02/22/2017   Hyperuricemia 10/31/2016   Hypothyroidism    s/p radioactive iodine tx for graves in 2001   Intestinal metaplasia of gastric mucosa    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   Neuromuscular disorder (HCC) 08/08/2016   Parkinson's   UTI (urinary tract infection)    Vitamin D deficiency 08/24/2012   Weight loss    eval with GI in 2016; s/p CT/MRI and PET scans   Past Surgical History:  Procedure Laterality Date   ABDOMINAL  HYSTERECTOMY  1989   CATARACT EXTRACTION, BILATERAL     COLONOSCOPY  07/28/2016   x several, last one in 2017   DG BARIUM SWALLOW ([facility] HX)     9 yrs ago per pt due to incomplete colonsocopy   RADIOLOGY WITH ANESTHESIA Left 11/18/2022   Procedure: Left Renal Cryoablation;  Surgeon: Adele Rush, MD;  Location: [facility] OR;  Service: Radiology;  Laterality: Left;   UPPER GI ENDOSCOPY  07/28/2016   Family History  Problem Relation Age of Onset   Cancer Mother        liver   Liver cancer Mother    Stroke Father 5   Diabetes Father    Hypertension Father    Diabetes Maternal Grandmother    Prostate cancer Maternal Grandfather    Pancreatic cancer Brother  Diabetes Sister    Cancer Sister    Diabetes Sister    Colon polyps Neg Hx    Colon cancer Neg Hx    Esophageal cancer Neg Hx    Rectal cancer Neg Hx    Stomach cancer Neg Hx    Social History   Socioeconomic History   Marital status: Single    Spouse name: Not on file   Number of children: 1   Years of education: 20   Highest education level: Professional school degree (e.g., MD, DDS, DVM, JD)  Occupational History   Occupation: retired    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[employer], histology, anatomy and physiology  Tobacco Use   Smoking status: Never   Smokeless tobacco: Never  Vaping Use   Vaping status: Never Used  Substance and Sexual Activity   Alcohol use: No    Alcohol/week: 0.0 standard drinks of alcohol   Drug use: No   Sexual activity: Not Currently    Birth control/protection: Surgical    Comment: ABD Hysterectomy  Other Topics Concern   Not on file  Social History Narrative   Work: recently retired Tree surgeon, 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 Situation:  Lives alone, no family around, good friendsSpiritual Beliefs: christian, very involved with her church - [employer] churchLifestyle: exercising 5 days per week, trying to eat healthy   Caffeine 1/2 cup daily   Social Drivers of  Health   Financial Resource Strain: Low Risk  (05/01/2024)   Overall Financial Resource Strain (CARDIA)    Difficulty of Paying Living Expenses: Not very hard  Food Insecurity: No Food Insecurity (05/01/2024)   Hunger Vital Sign    Worried About Running Out of Food in the Last Year: Never true    Ran Out of Food in the Last Year: Never true  Transportation Needs: No Transportation Needs (05/01/2024)   PRAPARE - Administrator, Civil Service (Medical): No    Lack of Transportation (Non-Medical): No  Physical Activity: Sufficiently Active (05/01/2024)   Exercise Vital Sign    Days of Exercise per Week: 6 days    Minutes of Exercise per Session: 30 min  Stress: Stress Concern Present (05/01/2024)   [employer] of Occupational Health - Occupational Stress Questionnaire    Feeling of Stress: To some extent  Social Connections: Moderately Integrated (05/01/2024)   Social Connection and Isolation Panel    Frequency of Communication with Friends and Family: More than three times a week    Frequency of Social Gatherings with Friends and Family: More than three times a week    Attends Religious Services: More than 4 times per year    Active Member of [employer] or Organizations: Yes    Attends Engineer, structural: More than 4 times per year    Marital Status: Never married    Tobacco Counseling Counseling given: Not Answered    Clinical Intake:  Pre-visit preparation completed: Yes  Pain : No/denies pain     BMI - recorded: 20.18 Nutritional Status: BMI of 19-24  Normal Nutritional Risks: None Diabetes: Yes CBG done?: Yes (CBG 176 Per patient) CBG resulted in Enter/ Edit results?: Yes Did pt. bring in CBG monitor from home?: No  Lab Results  Component Value Date   HGBA1C 7.8 04/27/2023   HGBA1C 8.1 (H) 09/11/2018   HGBA1C 7.3 05/22/2018     How often do you need to have someone help you when you read instructions, pamphlets, or  other written materials  from your doctor or pharmacy?: 3 - Sometimes (Son assist)  Interpreter Needed?: No  Information entered by :: Rojelio Blush LPN   Activities of Daily Living     05/01/2024   11:41 AM 01/03/2024    2:19 PM  In your present state of health, do you have any difficulty performing the following activities:  Hearing? 0 0  Vision? 0 0  Difficulty concentrating or making decisions? 1   Comment Followed by medical attention   Walking or climbing stairs? 1 1  Comment Uses a Health and safety inspector or bathing? 1 1  Comment Son assist   Doing errands, shopping? 1 1  Comment Son Advice worker and eating ? Y Y  Comment Son assist   Using the Toilet? CINDERELLA CINDERELLA  Comment Son assist   In the past six months, have you accidently leaked urine? CINDERELLA CINDERELLA  Comment Wears Depends.   Do you have problems with loss of bowel control? Y Y  Managing your Medications? CINDERELLA CINDERELLA  Comment Son assist   Managing your Finances? Y N  Comment Son Veterinary surgeon or managing your Housekeeping? Y   Comment Son assist     Patient Care Team: Kermit Lear Danas, DO as PCP - General (Endocrinology) Court Dorn PARAS, MD as PCP - Cardiology (Cardiology) Cam Morene ORN, MD as Attending Physician (Urology) Armbruster, Elspeth SQUIBB, MD as Consulting Physician (Gastroenterology) Altheimer, Ozell, MD as Referring Physician (Endocrinology) [name], Asberry RAMAN, DO as Consulting Physician (Neurology)  I have updated your Care Teams any recent Medical Services you may have received from other providers in the past year.     Assessment:   This is a routine wellness examination for Joyous.  Hearing/Vision screen Hearing Screening - Comments:: Denies hearing difficulties   Vision Screening - Comments:: Wears rx glasses - up to date with routine eye exams with  Dr Cleatus   Goals Addressed               This Visit's Progress     Increase physical activity (pt-stated)        Remain active        Depression Screen     05/01/2024   11:38 AM 01/02/2023    4:13 PM 12/29/2021    1:31 PM 11/15/2021   10:17 AM 11/01/2021   11:13 AM 06/30/2020   11:30 AM 08/20/2019    1:03 PM  PHQ 2/9 Scores  PHQ - 2 Score 0 0 0 2 2 0 1  PHQ- 9 Score   0 7 8      Fall Risk     05/01/2024   11:43 AM 01/03/2024    2:19 PM 07/12/2023    2:50 PM 05/04/2023   12:46 PM 01/19/2023   11:18 AM  Fall Risk   Falls in the past year? 1 1 0 1 1  Number falls in past yr: 0 1  0 1  Injury with Fall? 0 1  0 1  Risk for fall due to : No Fall Risks    History of fall(s);Impaired mobility;Medication side effect;Impaired balance/gait  Follow up Falls evaluation completed   Falls evaluation completed Falls prevention discussed    MEDICARE RISK AT HOME:  Medicare Risk at Home Any stairs in or around the home?: No If so, are there any without handrails?: No Home free of loose throw rugs in walkways, pet beds, electrical cords, etc?: Yes Adequate lighting  in your home to reduce risk of falls?: Yes Life alert?: No Use of a cane, walker or w/c?: Yes Grab bars in the bathroom?: No Shower chair or bench in shower?: Yes Elevated toilet seat or a handicapped toilet?: No  TIMED UP AND GO:  Was the test performed?  No  Cognitive Function: 6CIT completed    05/08/2018   10:04 AM  MMSE - Mini Mental State Exam  Not completed: --      10/24/2022    8:00 AM  Montreal Cognitive Assessment   Visuospatial/ Executive (0/5) 5  Naming (0/3) 3  Attention: Read list of digits (0/2) 1  Attention: Read list of letters (0/1) 1  Attention: Serial 7 subtraction starting at 100 (0/3) 1  Language: Repeat phrase (0/2) 2  Language : Fluency (0/1) 0  Abstraction (0/2) 2  Delayed Recall (0/5) 3  Orientation (0/6) 6  Total 24  Adjusted Score (based on education) 24      05/01/2024   11:44 AM 01/02/2023    4:17 PM 12/29/2021    1:38 PM 06/30/2020   11:43 AM 05/04/2017    9:47 AM  6CIT Screen  What Year? 0 points 0 points 0 points 0  points 0 points  What month? 0 points 0 points 0 points 0 points 0 points  What time? 0 points 0 points 0 points  0 points  Count back from 20 0 points 0 points 0 points 0 points 0 points  Months in reverse 4 points 2 points 0 points 0 points 0 points  Repeat phrase 10 points 8 points 0 points 2 points 0 points  Total Score 14 points 10 points 0 points  0 points    Immunizations Immunization History  Administered Date(s) Administered   Fluad Quad(high Dose 65+) 08/11/2022   Influenza Whole 07/03/2012   Influenza, High Dose Seasonal PF 08/01/2017, 08/23/2018, 06/21/2019   Influenza-Unspecified 07/04/2015, 07/04/2016, 07/03/2017, 08/21/2018, 06/21/2019, 07/03/2021   PFIZER(Purple Top)SARS-COV-2 Vaccination 10/23/2019, 11/11/2019, 08/02/2020   PNEUMOCOCCAL CONJUGATE-20 11/01/2021   Pfizer Covid-19 Vaccine Bivalent Booster 90yrs & up 09/20/2021   Pneumococcal Conjugate-13 06/17/2014   Pneumococcal Polysaccharide-23 10/03/2009, 02/01/2011   Tdap 03/03/2009, 03/12/2016   Zoster, Live 01/31/2010    Screening Tests Health Maintenance  Topic Date Due   Diabetic kidney evaluation - Urine ACR  Never done   Zoster Vaccines- Shingrix (1 of 2) 10/03/1963   FOOT EXAM  11/24/2020   COVID-19 Vaccine (5 - 2024-25 season) 06/04/2023   HEMOGLOBIN A1C  10/28/2023   OPHTHALMOLOGY EXAM  03/22/2024   INFLUENZA VACCINE  05/03/2024   Diabetic kidney evaluation - eGFR measurement  08/28/2024   Medicare Annual Wellness (AWV)  05/01/2025   DTaP/Tdap/Td (3 - Td or Tdap) 03/12/2026   Pneumococcal Vaccine: 50+ Years  Completed   DEXA SCAN  Completed   Hepatitis C Screening  Completed   Hepatitis B Vaccines  Aged Out   HPV VACCINES  Aged Out   Meningococcal B Vaccine  Aged Out   Colonoscopy  Discontinued    Health Maintenance  Health Maintenance Due  Topic Date Due   Diabetic kidney evaluation - Urine ACR  Never done   Zoster Vaccines- Shingrix (1 of 2) 10/03/1963   FOOT EXAM  11/24/2020    COVID-19 Vaccine (5 - 2024-25 season) 06/04/2023   HEMOGLOBIN A1C  10/28/2023   OPHTHALMOLOGY EXAM  03/22/2024   Health Maintenance Items Addressed: Labs, vaccines and Eye Exam deferred   Additional Screening:  Vision Screening: Recommended annual  ophthalmology exams for early detection of glaucoma and other disorders of the eye. Would you like a referral to an eye doctor? No    Dental Screening: Recommended annual dental exams for proper oral hygiene  Community Resource Referral / Chronic Care Management: CRR required this visit?  No   CCM required this visit?  No   Plan:    I have personally reviewed and noted the following in the patient's chart:   Medical and social history Use of alcohol, tobacco or illicit drugs  Current medications and supplements including opioid prescriptions. Patient is not currently taking opioid prescriptions. Functional ability and status Nutritional status Physical activity Advanced directives List of other physicians Hospitalizations, surgeries, and ER visits in previous 12 months Vitals Screenings to include cognitive, depression, and falls Referrals and appointments  In addition, I have reviewed and discussed with patient certain preventive protocols, quality metrics, and best practice recommendations. A written personalized care plan for preventive services as well as general preventive health recommendations were provided to patient.   Rojelio LELON Blush, LPN   2/69/7974   After Visit Summary: (MyChart) Due to this being a telephonic visit, the after visit summary with patients personalized plan was offered to patient via MyChart   Notes: Nothing significant to report at this time.

## 2024-05-01 NOTE — Patient Instructions (Addendum)
 Pamela Patton , Thank you for taking time out of your busy schedule to complete your Annual Wellness Visit with me. I enjoyed our conversation and look forward to speaking with you again next year. I, as well as your care team,  appreciate your ongoing commitment to your health goals. Please review the following plan we discussed and let me know if I can assist you in the future. Your Game plan/ To Do List    Referrals: If you haven't heard from the office you've been referred to, please reach out to them at the phone provided.   Follow up Visits: Next Medicare AWV with our clinical staff: 05/07/25 @ 11:20a   Have you seen your provider in the last 6 months (3 months if uncontrolled diabetes)? 07/13/23 Next Office Visit with your provider:   Clinician Recommendations:  Aim for 30 minutes of exercise or brisk walking, 6-8 glasses of water, and 5 servings of fruits and vegetables each day.       This is a list of the screening recommended for you and due dates:  Health Maintenance  Topic Date Due   Yearly kidney health urinalysis for diabetes  Never done   Zoster (Shingles) Vaccine (1 of 2) 10/03/1963   Complete foot exam   11/24/2020   COVID-19 Vaccine (5 - 2024-25 season) 06/04/2023   Hemoglobin A1C  10/28/2023   Eye exam for diabetics  03/22/2024   Flu Shot  05/03/2024   Yearly kidney function blood test for diabetes  08/28/2024   Medicare Annual Wellness Visit  05/01/2025   DTaP/Tdap/Td vaccine (3 - Td or Tdap) 03/12/2026   Pneumococcal Vaccine for age over 59  Completed   DEXA scan (bone density measurement)  Completed   Hepatitis C Screening  Completed   Hepatitis B Vaccine  Aged Out   HPV Vaccine  Aged Out   Meningitis B Vaccine  Aged Out   Colon Cancer Screening  Discontinued    Advanced directives: (Copy Requested) Please bring a copy of your health care power of attorney and living will to the office to be added to your chart at your convenience. You can mail to [facility] [address] [place], [place] [address] or email to [email or web address] Advance Care Planning is important because it:  [x]  Makes sure you receive the medical care that is consistent with your values, goals, and preferences  [x]  It provides guidance to your family and loved ones and reduces their decisional burden about whether or not they are making the right decisions based on your wishes.  Follow the link provided in your after visit summary or read over the paperwork we have mailed to you to help you started getting your Advance Directives in place. If you need assistance in completing these, please reach out to us  so that we can help you!  See attachments for Preventive Care and Fall Prevention Tips.

## 2024-05-03 DIAGNOSIS — G20A1 Parkinson's disease without dyskinesia, without mention of fluctuations: Secondary | ICD-10-CM | POA: Diagnosis not present

## 2024-05-03 DIAGNOSIS — G459 Transient cerebral ischemic attack, unspecified: Secondary | ICD-10-CM | POA: Diagnosis not present

## 2024-05-08 DIAGNOSIS — G20A1 Parkinson's disease without dyskinesia, without mention of fluctuations: Secondary | ICD-10-CM | POA: Diagnosis not present

## 2024-05-10 DIAGNOSIS — S90422A Blister (nonthermal), left great toe, initial encounter: Secondary | ICD-10-CM | POA: Diagnosis not present

## 2024-05-10 DIAGNOSIS — L84 Corns and callosities: Secondary | ICD-10-CM | POA: Diagnosis not present

## 2024-05-10 DIAGNOSIS — L603 Nail dystrophy: Secondary | ICD-10-CM | POA: Diagnosis not present

## 2024-05-10 DIAGNOSIS — S90821A Blister (nonthermal), right foot, initial encounter: Secondary | ICD-10-CM | POA: Diagnosis not present

## 2024-05-10 DIAGNOSIS — E1142 Type 2 diabetes mellitus with diabetic polyneuropathy: Secondary | ICD-10-CM | POA: Diagnosis not present

## 2024-05-13 DIAGNOSIS — G20A1 Parkinson's disease without dyskinesia, without mention of fluctuations: Secondary | ICD-10-CM | POA: Diagnosis not present

## 2024-05-14 DIAGNOSIS — M6281 Muscle weakness (generalized): Secondary | ICD-10-CM | POA: Diagnosis not present

## 2024-05-14 DIAGNOSIS — F32A Depression, unspecified: Secondary | ICD-10-CM | POA: Diagnosis not present

## 2024-05-14 DIAGNOSIS — I10 Essential (primary) hypertension: Secondary | ICD-10-CM | POA: Diagnosis not present

## 2024-05-14 DIAGNOSIS — G20C Parkinsonism, unspecified: Secondary | ICD-10-CM | POA: Diagnosis not present

## 2024-05-14 DIAGNOSIS — E108 Type 1 diabetes mellitus with unspecified complications: Secondary | ICD-10-CM | POA: Diagnosis not present

## 2024-05-15 DIAGNOSIS — G20A1 Parkinson's disease without dyskinesia, without mention of fluctuations: Secondary | ICD-10-CM | POA: Diagnosis not present

## 2024-05-17 DIAGNOSIS — G20C Parkinsonism, unspecified: Secondary | ICD-10-CM | POA: Diagnosis not present

## 2024-05-17 DIAGNOSIS — Z604 Social exclusion and rejection: Secondary | ICD-10-CM | POA: Diagnosis not present

## 2024-05-17 DIAGNOSIS — F32A Depression, unspecified: Secondary | ICD-10-CM | POA: Diagnosis not present

## 2024-05-17 DIAGNOSIS — R2689 Other abnormalities of gait and mobility: Secondary | ICD-10-CM | POA: Diagnosis not present

## 2024-05-19 DIAGNOSIS — M6281 Muscle weakness (generalized): Secondary | ICD-10-CM | POA: Diagnosis not present

## 2024-05-20 DIAGNOSIS — G20A1 Parkinson's disease without dyskinesia, without mention of fluctuations: Secondary | ICD-10-CM | POA: Diagnosis not present

## 2024-05-22 DIAGNOSIS — G20A1 Parkinson's disease without dyskinesia, without mention of fluctuations: Secondary | ICD-10-CM | POA: Diagnosis not present

## 2024-05-29 DIAGNOSIS — G20A1 Parkinson's disease without dyskinesia, without mention of fluctuations: Secondary | ICD-10-CM | POA: Diagnosis not present

## 2024-06-05 DIAGNOSIS — G20A1 Parkinson's disease without dyskinesia, without mention of fluctuations: Secondary | ICD-10-CM | POA: Diagnosis not present

## 2024-06-10 DIAGNOSIS — G20A1 Parkinson's disease without dyskinesia, without mention of fluctuations: Secondary | ICD-10-CM | POA: Diagnosis not present

## 2024-06-12 DIAGNOSIS — G20A1 Parkinson's disease without dyskinesia, without mention of fluctuations: Secondary | ICD-10-CM | POA: Diagnosis not present

## 2024-06-17 DIAGNOSIS — M6281 Muscle weakness (generalized): Secondary | ICD-10-CM | POA: Diagnosis not present

## 2024-06-17 DIAGNOSIS — E108 Type 1 diabetes mellitus with unspecified complications: Secondary | ICD-10-CM | POA: Diagnosis not present

## 2024-06-17 DIAGNOSIS — I10 Essential (primary) hypertension: Secondary | ICD-10-CM | POA: Diagnosis not present

## 2024-06-17 DIAGNOSIS — G20C Parkinsonism, unspecified: Secondary | ICD-10-CM | POA: Diagnosis not present

## 2024-06-19 DIAGNOSIS — G20A1 Parkinson's disease without dyskinesia, without mention of fluctuations: Secondary | ICD-10-CM | POA: Diagnosis not present

## 2024-06-19 DIAGNOSIS — E1165 Type 2 diabetes mellitus with hyperglycemia: Secondary | ICD-10-CM | POA: Diagnosis not present

## 2024-06-19 DIAGNOSIS — M6281 Muscle weakness (generalized): Secondary | ICD-10-CM | POA: Diagnosis not present

## 2024-06-24 DIAGNOSIS — M6281 Muscle weakness (generalized): Secondary | ICD-10-CM | POA: Diagnosis not present

## 2024-06-24 DIAGNOSIS — I10 Essential (primary) hypertension: Secondary | ICD-10-CM | POA: Diagnosis not present

## 2024-06-24 DIAGNOSIS — G20C Parkinsonism, unspecified: Secondary | ICD-10-CM | POA: Diagnosis not present

## 2024-06-24 DIAGNOSIS — E108 Type 1 diabetes mellitus with unspecified complications: Secondary | ICD-10-CM | POA: Diagnosis not present

## 2024-06-24 DIAGNOSIS — G20A1 Parkinson's disease without dyskinesia, without mention of fluctuations: Secondary | ICD-10-CM | POA: Diagnosis not present

## 2024-06-26 DIAGNOSIS — G20A1 Parkinson's disease without dyskinesia, without mention of fluctuations: Secondary | ICD-10-CM | POA: Diagnosis not present

## 2024-07-01 DIAGNOSIS — G20A1 Parkinson's disease without dyskinesia, without mention of fluctuations: Secondary | ICD-10-CM | POA: Diagnosis not present

## 2024-07-03 DIAGNOSIS — G20A1 Parkinson's disease without dyskinesia, without mention of fluctuations: Secondary | ICD-10-CM | POA: Diagnosis not present

## 2024-07-10 DIAGNOSIS — I10 Essential (primary) hypertension: Secondary | ICD-10-CM | POA: Diagnosis not present

## 2024-07-10 DIAGNOSIS — E109 Type 1 diabetes mellitus without complications: Secondary | ICD-10-CM | POA: Diagnosis not present

## 2024-07-10 DIAGNOSIS — E039 Hypothyroidism, unspecified: Secondary | ICD-10-CM | POA: Diagnosis not present

## 2024-07-10 DIAGNOSIS — G20A1 Parkinson's disease without dyskinesia, without mention of fluctuations: Secondary | ICD-10-CM | POA: Diagnosis not present

## 2024-07-10 DIAGNOSIS — E7849 Other hyperlipidemia: Secondary | ICD-10-CM | POA: Diagnosis not present

## 2024-07-10 DIAGNOSIS — N390 Urinary tract infection, site not specified: Secondary | ICD-10-CM | POA: Diagnosis not present

## 2024-07-10 DIAGNOSIS — E559 Vitamin D deficiency, unspecified: Secondary | ICD-10-CM | POA: Diagnosis not present

## 2024-07-12 DIAGNOSIS — E559 Vitamin D deficiency, unspecified: Secondary | ICD-10-CM | POA: Diagnosis not present

## 2024-07-12 DIAGNOSIS — E039 Hypothyroidism, unspecified: Secondary | ICD-10-CM | POA: Diagnosis not present

## 2024-07-12 DIAGNOSIS — E109 Type 1 diabetes mellitus without complications: Secondary | ICD-10-CM | POA: Diagnosis not present

## 2024-07-12 DIAGNOSIS — E7849 Other hyperlipidemia: Secondary | ICD-10-CM | POA: Diagnosis not present

## 2024-07-12 DIAGNOSIS — N390 Urinary tract infection, site not specified: Secondary | ICD-10-CM | POA: Diagnosis not present

## 2024-07-15 DIAGNOSIS — E559 Vitamin D deficiency, unspecified: Secondary | ICD-10-CM | POA: Diagnosis not present

## 2024-07-15 DIAGNOSIS — I10 Essential (primary) hypertension: Secondary | ICD-10-CM | POA: Diagnosis not present

## 2024-07-15 DIAGNOSIS — E1365 Other specified diabetes mellitus with hyperglycemia: Secondary | ICD-10-CM | POA: Diagnosis not present

## 2024-07-15 DIAGNOSIS — N183 Chronic kidney disease, stage 3 unspecified: Secondary | ICD-10-CM | POA: Diagnosis not present

## 2024-07-15 DIAGNOSIS — E039 Hypothyroidism, unspecified: Secondary | ICD-10-CM | POA: Diagnosis not present

## 2024-07-15 DIAGNOSIS — E109 Type 1 diabetes mellitus without complications: Secondary | ICD-10-CM | POA: Diagnosis not present

## 2024-07-15 DIAGNOSIS — E7849 Other hyperlipidemia: Secondary | ICD-10-CM | POA: Diagnosis not present

## 2024-07-15 DIAGNOSIS — G20A1 Parkinson's disease without dyskinesia, without mention of fluctuations: Secondary | ICD-10-CM | POA: Diagnosis not present

## 2024-07-15 DIAGNOSIS — K921 Melena: Secondary | ICD-10-CM | POA: Diagnosis not present

## 2024-07-17 DIAGNOSIS — G20A1 Parkinson's disease without dyskinesia, without mention of fluctuations: Secondary | ICD-10-CM | POA: Diagnosis not present

## 2024-07-22 DIAGNOSIS — G20A1 Parkinson's disease without dyskinesia, without mention of fluctuations: Secondary | ICD-10-CM | POA: Diagnosis not present

## 2024-07-23 ENCOUNTER — Telehealth: Payer: Self-pay

## 2024-07-23 NOTE — Telephone Encounter (Signed)
 Patient was identified as falling into the True [place] Measure - Diabetes.   Patient was: Attribution and/or data issue.  Validation/Investigation needed.  Explanation:  Patient is listed under the care of Miya Farley Faster as PCP.

## 2024-07-29 DIAGNOSIS — Z604 Social exclusion and rejection: Secondary | ICD-10-CM | POA: Diagnosis not present

## 2024-07-29 DIAGNOSIS — G20A1 Parkinson's disease without dyskinesia, without mention of fluctuations: Secondary | ICD-10-CM | POA: Diagnosis not present

## 2024-07-29 DIAGNOSIS — G20C Parkinsonism, unspecified: Secondary | ICD-10-CM | POA: Diagnosis not present

## 2024-07-29 DIAGNOSIS — F32A Depression, unspecified: Secondary | ICD-10-CM | POA: Diagnosis not present

## 2024-07-30 DIAGNOSIS — L84 Corns and callosities: Secondary | ICD-10-CM | POA: Diagnosis not present

## 2024-07-30 DIAGNOSIS — L603 Nail dystrophy: Secondary | ICD-10-CM | POA: Diagnosis not present

## 2024-07-30 DIAGNOSIS — S90821D Blister (nonthermal), right foot, subsequent encounter: Secondary | ICD-10-CM | POA: Diagnosis not present

## 2024-07-30 DIAGNOSIS — E1142 Type 2 diabetes mellitus with diabetic polyneuropathy: Secondary | ICD-10-CM | POA: Diagnosis not present

## 2024-07-30 DIAGNOSIS — S90422D Blister (nonthermal), left great toe, subsequent encounter: Secondary | ICD-10-CM | POA: Diagnosis not present

## 2024-07-31 DIAGNOSIS — F32A Depression, unspecified: Secondary | ICD-10-CM | POA: Diagnosis not present

## 2024-07-31 DIAGNOSIS — G20C Parkinsonism, unspecified: Secondary | ICD-10-CM | POA: Diagnosis not present

## 2024-07-31 DIAGNOSIS — Z604 Social exclusion and rejection: Secondary | ICD-10-CM | POA: Diagnosis not present

## 2024-08-05 DIAGNOSIS — R2681 Unsteadiness on feet: Secondary | ICD-10-CM | POA: Diagnosis not present

## 2024-08-05 DIAGNOSIS — G20A1 Parkinson's disease without dyskinesia, without mention of fluctuations: Secondary | ICD-10-CM | POA: Diagnosis not present

## 2024-08-07 DIAGNOSIS — G20A1 Parkinson's disease without dyskinesia, without mention of fluctuations: Secondary | ICD-10-CM | POA: Diagnosis not present

## 2024-08-07 DIAGNOSIS — R2681 Unsteadiness on feet: Secondary | ICD-10-CM | POA: Diagnosis not present

## 2024-08-09 DIAGNOSIS — G459 Transient cerebral ischemic attack, unspecified: Secondary | ICD-10-CM | POA: Diagnosis not present

## 2024-08-09 DIAGNOSIS — G20A1 Parkinson's disease without dyskinesia, without mention of fluctuations: Secondary | ICD-10-CM | POA: Diagnosis not present

## 2024-08-14 DIAGNOSIS — R2681 Unsteadiness on feet: Secondary | ICD-10-CM | POA: Diagnosis not present

## 2024-08-19 DIAGNOSIS — R441 Visual hallucinations: Secondary | ICD-10-CM | POA: Diagnosis not present

## 2024-08-19 DIAGNOSIS — M6281 Muscle weakness (generalized): Secondary | ICD-10-CM | POA: Diagnosis not present

## 2024-08-19 DIAGNOSIS — F32A Depression, unspecified: Secondary | ICD-10-CM | POA: Diagnosis not present

## 2024-08-19 DIAGNOSIS — G20A1 Parkinson's disease without dyskinesia, without mention of fluctuations: Secondary | ICD-10-CM | POA: Diagnosis not present

## 2024-08-19 DIAGNOSIS — R2681 Unsteadiness on feet: Secondary | ICD-10-CM | POA: Diagnosis not present

## 2024-08-19 DIAGNOSIS — G20C Parkinsonism, unspecified: Secondary | ICD-10-CM | POA: Diagnosis not present

## 2024-08-19 DIAGNOSIS — Z604 Social exclusion and rejection: Secondary | ICD-10-CM | POA: Diagnosis not present

## 2024-08-28 DIAGNOSIS — E039 Hypothyroidism, unspecified: Secondary | ICD-10-CM | POA: Diagnosis not present

## 2024-08-28 DIAGNOSIS — E109 Type 1 diabetes mellitus without complications: Secondary | ICD-10-CM | POA: Diagnosis not present

## 2024-09-11 DIAGNOSIS — R001 Bradycardia, unspecified: Secondary | ICD-10-CM | POA: Diagnosis not present

## 2024-09-11 DIAGNOSIS — I480 Paroxysmal atrial fibrillation: Secondary | ICD-10-CM | POA: Diagnosis not present

## 2024-09-14 ENCOUNTER — Telehealth: Payer: Self-pay

## 2024-09-14 NOTE — Telephone Encounter (Signed)
 Patient was identified as falling into the True [place] Measure - Diabetes.   Patient was: Patient is not currently using our practice. She has been living in [place]  with he son temporarily gustabo has a doctor down there. They are not sure If the move is permanent and would like to remain a patient at [facility]. He will call when in town to make an appt. Had labs in Sept in [place] .

## 2025-05-07 ENCOUNTER — Ambulatory Visit

## 2025-05-07 ENCOUNTER — Ambulatory Visit: Admitting: Family Medicine
